# Patient Record
Sex: Male | Born: 1960 | Hispanic: No | Marital: Single | Smoking: Never smoker
Health system: Southern US, Community
[De-identification: ages and names within clinical notes are randomized; demographics above are authoritative.]

## PROBLEM LIST (undated history)

## (undated) DIAGNOSIS — I10 Essential (primary) hypertension: Secondary | ICD-10-CM

## (undated) DIAGNOSIS — C9000 Multiple myeloma not having achieved remission: Secondary | ICD-10-CM

## (undated) DIAGNOSIS — J9620 Acute and chronic respiratory failure, unspecified whether with hypoxia or hypercapnia: Secondary | ICD-10-CM

## (undated) DIAGNOSIS — J449 Chronic obstructive pulmonary disease, unspecified: Secondary | ICD-10-CM

## (undated) DIAGNOSIS — I272 Pulmonary hypertension, unspecified: Secondary | ICD-10-CM

## (undated) DIAGNOSIS — N200 Calculus of kidney: Secondary | ICD-10-CM

## (undated) HISTORY — PX: KIDNEY SURGERY: SHX687

---

## 2001-11-04 HISTORY — PX: VIDEO ASSISTED THORACOSCOPY (VATS)/DECORTICATION: SHX6171

## 2001-12-27 ENCOUNTER — Inpatient Hospital Stay (HOSPITAL_COMMUNITY): Admission: EM | Admit: 2001-12-27 | Discharge: 2002-01-07 | Payer: Self-pay

## 2001-12-28 ENCOUNTER — Encounter: Payer: Self-pay | Admitting: Family Medicine

## 2001-12-30 ENCOUNTER — Encounter: Payer: Self-pay | Admitting: Cardiothoracic Surgery

## 2001-12-31 ENCOUNTER — Encounter: Payer: Self-pay | Admitting: Cardiothoracic Surgery

## 2002-01-01 ENCOUNTER — Encounter: Payer: Self-pay | Admitting: Cardiothoracic Surgery

## 2002-01-02 ENCOUNTER — Encounter: Payer: Self-pay | Admitting: Cardiothoracic Surgery

## 2002-01-03 ENCOUNTER — Encounter: Payer: Self-pay | Admitting: Cardiothoracic Surgery

## 2002-01-04 ENCOUNTER — Encounter: Payer: Self-pay | Admitting: Cardiothoracic Surgery

## 2002-01-05 ENCOUNTER — Encounter: Payer: Self-pay | Admitting: Cardiothoracic Surgery

## 2002-01-06 ENCOUNTER — Encounter: Payer: Self-pay | Admitting: Cardiothoracic Surgery

## 2002-01-07 ENCOUNTER — Encounter: Payer: Self-pay | Admitting: Cardiothoracic Surgery

## 2002-01-07 ENCOUNTER — Encounter: Admission: RE | Admit: 2002-01-07 | Discharge: 2002-01-07 | Payer: Self-pay | Admitting: Family Medicine

## 2002-01-14 ENCOUNTER — Encounter: Admission: RE | Admit: 2002-01-14 | Discharge: 2002-01-14 | Payer: Self-pay | Admitting: Cardiothoracic Surgery

## 2002-01-14 ENCOUNTER — Encounter: Payer: Self-pay | Admitting: Cardiothoracic Surgery

## 2002-01-28 ENCOUNTER — Encounter: Payer: Self-pay | Admitting: Cardiothoracic Surgery

## 2002-01-28 ENCOUNTER — Encounter: Admission: RE | Admit: 2002-01-28 | Discharge: 2002-01-28 | Payer: Self-pay | Admitting: Cardiothoracic Surgery

## 2002-02-18 ENCOUNTER — Encounter: Payer: Self-pay | Admitting: Cardiothoracic Surgery

## 2002-02-18 ENCOUNTER — Encounter: Admission: RE | Admit: 2002-02-18 | Discharge: 2002-02-18 | Payer: Self-pay | Admitting: Cardiothoracic Surgery

## 2005-10-09 ENCOUNTER — Emergency Department (HOSPITAL_COMMUNITY): Admission: EM | Admit: 2005-10-09 | Discharge: 2005-10-09 | Payer: Self-pay | Admitting: Family Medicine

## 2005-10-12 ENCOUNTER — Emergency Department (HOSPITAL_COMMUNITY): Admission: EM | Admit: 2005-10-12 | Discharge: 2005-10-12 | Payer: Self-pay | Admitting: Family Medicine

## 2011-04-16 ENCOUNTER — Inpatient Hospital Stay (INDEPENDENT_AMBULATORY_CARE_PROVIDER_SITE_OTHER)
Admission: RE | Admit: 2011-04-16 | Discharge: 2011-04-16 | Disposition: A | Discharge status: Home / Self Care | Payer: BC Managed Care – PPO | Source: Ambulatory Visit | Attending: Family Medicine | Admitting: Family Medicine

## 2011-04-16 DIAGNOSIS — K219 Gastro-esophageal reflux disease without esophagitis: Secondary | ICD-10-CM

## 2011-04-17 ENCOUNTER — Inpatient Hospital Stay (HOSPITAL_COMMUNITY)
Admission: EM | Admit: 2011-04-17 | Discharge: 2011-04-22 | DRG: 089 | Disposition: A | Discharge status: Home / Self Care | Payer: BC Managed Care – PPO | Attending: Internal Medicine | Admitting: Internal Medicine

## 2011-04-17 ENCOUNTER — Emergency Department (HOSPITAL_COMMUNITY): Payer: BC Managed Care – PPO

## 2011-04-17 DIAGNOSIS — J189 Pneumonia, unspecified organism: Principal | ICD-10-CM | POA: Diagnosis present

## 2011-04-17 DIAGNOSIS — K219 Gastro-esophageal reflux disease without esophagitis: Secondary | ICD-10-CM | POA: Diagnosis present

## 2011-04-17 DIAGNOSIS — E876 Hypokalemia: Secondary | ICD-10-CM | POA: Diagnosis present

## 2011-04-17 DIAGNOSIS — J90 Pleural effusion, not elsewhere classified: Secondary | ICD-10-CM | POA: Diagnosis present

## 2011-04-17 LAB — DIFFERENTIAL
Basophils Absolute: 0 10*3/uL (ref 0.0–0.1)
Basophils Relative: 0 % (ref 0–1)
Eosinophils Absolute: 0 10*3/uL (ref 0.0–0.7)
Monocytes Relative: 5 % (ref 3–12)
Neutro Abs: 11.9 10*3/uL — ABNORMAL HIGH (ref 1.7–7.7)
Neutrophils Relative %: 86 % — ABNORMAL HIGH (ref 43–77)

## 2011-04-17 LAB — POCT I-STAT, CHEM 8
BUN: 12 mg/dL (ref 6–23)
Calcium, Ion: 1.08 mmol/L — ABNORMAL LOW (ref 1.12–1.32)
Chloride: 98 mEq/L (ref 96–112)
Creatinine, Ser: 1.1 mg/dL (ref 0.4–1.5)
Glucose, Bld: 104 mg/dL — ABNORMAL HIGH (ref 70–99)
HCT: 51 % (ref 39.0–52.0)
Hemoglobin: 17.3 g/dL — ABNORMAL HIGH (ref 13.0–17.0)
Potassium: 3.7 mEq/L (ref 3.5–5.1)
Sodium: 136 mEq/L (ref 135–145)
TCO2: 29 mmol/L (ref 0–100)

## 2011-04-17 LAB — CBC
Hemoglobin: 15.9 g/dL (ref 13.0–17.0)
MCH: 28.3 pg (ref 26.0–34.0)
Platelets: 297 10*3/uL (ref 150–400)
RBC: 5.61 MIL/uL (ref 4.22–5.81)
WBC: 13.9 10*3/uL — ABNORMAL HIGH (ref 4.0–10.5)

## 2011-04-17 LAB — HEPATIC FUNCTION PANEL
ALT: 14 U/L (ref 0–53)
AST: 18 U/L (ref 0–37)
Albumin: 2.9 g/dL — ABNORMAL LOW (ref 3.5–5.2)
Alkaline Phosphatase: 104 U/L (ref 39–117)
Total Protein: 9 g/dL — ABNORMAL HIGH (ref 6.0–8.3)

## 2011-04-17 MED ORDER — IOHEXOL 300 MG/ML  SOLN
80.0000 mL | Freq: Once | INTRAMUSCULAR | Status: AC | PRN
  Administered 2011-04-17: 80 mL via INTRAVENOUS

## 2011-04-18 LAB — EXPECTORATED SPUTUM ASSESSMENT W GRAM STAIN, RFLX TO RESP C

## 2011-04-19 LAB — CBC
HCT: 38.6 % — ABNORMAL LOW (ref 39.0–52.0)
Hemoglobin: 13.1 g/dL (ref 13.0–17.0)
MCHC: 33.9 g/dL (ref 30.0–36.0)
RBC: 4.81 MIL/uL (ref 4.22–5.81)
WBC: 7 10*3/uL (ref 4.0–10.5)

## 2011-04-19 LAB — BASIC METABOLIC PANEL
CO2: 29 mEq/L (ref 19–32)
Calcium: 8.1 mg/dL — ABNORMAL LOW (ref 8.4–10.5)
Creatinine, Ser: 0.75 mg/dL (ref 0.50–1.35)
GFR calc non Af Amer: >60 mL/min (ref >60)
Sodium: 140 mEq/L (ref 135–145)

## 2011-04-20 LAB — EXPECTORATED SPUTUM ASSESSMENT W GRAM STAIN, RFLX TO RESP C

## 2011-04-21 LAB — BASIC METABOLIC PANEL
BUN: 7 mg/dL (ref 6–23)
CO2: 31 mEq/L (ref 19–32)
Chloride: 103 mEq/L (ref 96–112)
Creatinine, Ser: 0.78 mg/dL (ref 0.50–1.35)
GFR calc Af Amer: >60 mL/min (ref >60)
Glucose, Bld: 106 mg/dL — ABNORMAL HIGH (ref 70–99)

## 2011-04-21 LAB — CULTURE, RESPIRATORY W GRAM STAIN

## 2011-04-21 LAB — EXPECTORATED SPUTUM ASSESSMENT W GRAM STAIN, RFLX TO RESP C

## 2011-04-22 LAB — BASIC METABOLIC PANEL
CO2: 26 mEq/L (ref 19–32)
Glucose, Bld: 91 mg/dL (ref 70–99)
Potassium: 4.8 mEq/L (ref 3.5–5.1)
Sodium: 138 mEq/L (ref 135–145)

## 2011-04-22 LAB — CULTURE, RESPIRATORY W GRAM STAIN: Culture: NORMAL

## 2011-04-24 LAB — CULTURE, BLOOD (ROUTINE X 2)

## 2011-05-01 NOTE — Discharge Summary (Signed)
[name] [name], [name] [name]                    ACCOUNT NO.:  [id number]  MEDICAL RECORD NO.:  [id number]  LOCATION:  5509                         FACILITY:  [facility]  PHYSICIAN:  Kela Millin, M.D.DATE OF BIRTH:  01-31-1961  DATE OF ADMISSION:  04/17/2011 DATE OF DISCHARGE:  04/22/2011                        DISCHARGE SUMMARY - REFERRING   DISCHARGE DIAGNOSES: 1. Right lower lobe pneumonia, community acquired. 2. Chronic left pleural effusion, loculated - stable. 3. History of left loculated pleural effusion in 2003 and status post     bronchoscopy with left thoracotomy and decortication per Dr.     Tyrone Sage. 4. Hypokalemia - resolved.  PROCEDURES AND STUDIES: 1. CT scan of chest on April 17, 2011 - stable chronic pleural     collection in left, loculated compared to prior chest x-ray.  Right     lower lobe infiltrate, fever to be acute.  Pleural calcifications. 2. AFB smears x3 negative for acid-fast bacilli.  PPD negative.  BRIEF HISTORY:  The patient is a 50 year old male with the above-listed medical problems, who presented with a 3-day history of a cough productive of yellowish sputum and feeling feverish.  It was noted that he immigrated to the [place] about 26 years ago and his history is notable for left loculated pleural effusion in 2003 and status post bronchoscopy with left thoracotomy and decortication.  He had a chest x- ray done at the Urgent Care, which revealed right lower lobe infiltrate and he was sent to the [facility] ED for further evaluation and management. In the ED, labs revealed a white cell count of 13.6 with a hemoglobin of 15.6 and a CT scan of his chest was done and the results are as stated above.  Because of the concern of a TB, he was admitted to the Hospitalist Service for further evaluation and management.  HOSPITAL COURSE: 1. Community-acquired pneumonia, right lower lobe - upon admission the     patient was started on empiric antibiotics with  Rocephin and     Zithromax.  He was placed in respiratory isolation and a PPD skin     test was done and was read as negative.  Sputum for AFB was done     and the smears have come back x3 negative.  The patient's pleuritic     pain and cough have improved on the antibiotics.  He has remained     hemodynamically stable and afebrile and his last white cell count     prior to discharge was 7.0.  He will be discharged on oral     antibiotics to complete the treatment regimen.  He has been given     the number to [facility] to establish a PCP for outpatient     followup. 2. History of chronic left effusion - as discussed above, imaging     studies as above.  The patient is to follow up with Dr. Tyrone Sage     upon discharge.. 3. Hypokalemia - his potassium was replaced during this hospital stay.     His potassium today prior to discharge is 4.8.  DISCHARGE MEDICATIONS: 1. Ceftin 500 mg one p.o. b.i.d. for four more  days. 2. Mucinex DM 2 tablets b.i.d. 3. Tussionex 5 mL p.o. q.12 h for cough. 4. Ibuprofen 600 mg t.i.d. p.r.n. with food. 5. Ranitidine 150 mg p.o. t.i.d. 6. Sucralfate 1 gram p.o. b.i.d.  FOLLOWUP CARE: 1. Primary care physician in 1-2 weeks. 2. Dr. Tyrone Sage in 1-2 weeks.  Please call for appointment.  DISCHARGE CONDITION:  Improved/stable.     Kela Millin, M.D.     ACV/MEDQ  D:  04/22/2011  T:  04/22/2011  Job:  [id number]  Electronically Signed by Donnalee Curry M.D. on 05/01/2011 07:33:40 AM

## 2011-05-03 NOTE — H&P (Signed)
NAMEHANNAN, HUTMACHER NO.:  [id number]  MEDICAL RECORD NO.:  [id number]  LOCATION:  [facility]                         FACILITY:  [facility]  PHYSICIAN:  Houston Siren, MD           DATE OF BIRTH:  1960-11-06  DATE OF ADMISSION:  04/17/2011 DATE OF DISCHARGE:                             HISTORY & PHYSICAL   PRIMARY CARE PHYSICIAN:  None.  ADVANCE DIRECTIVE:  Full code.  REASON FOR ADMISSION:  Community-acquired pneumonia.  HISTORY OF PRESENT ILLNESS:  This is a 50 year old [place] male with benign past medical history, came to [place] 26 years ago, with history of left loculated pleural effusion in 2003, status post bronchoscopy and left thoracotomy with decortication, presents with 3- day history of yellow productive sputum and feeling feverish.  Chest x- ray from the urgent care showed right lower lobe infiltrate, and he was sent here.  Further workup showed leukocytosis with a white count of 13,600, hemoglobin of 15.6.  Normal liver function tests and normal creatinine of 1.1.  There was concern about tuberculosis and hospitalist was asked to admit the patient.  PAST MEDICAL HISTORY: 1. Recurrent pneumonia, chronic left loculated pleural effusion status     post thoracotomy and decortication. 2. GERD.  SOCIAL HISTORY:  He works as a Administrator.  Denied tobacco, alcohol use, but drink occasionally.  ALLERGIES:  No known drug allergies.  CHRONIC MEDICATIONS:  Carafate and Zantac.  REVIEW OF SYSTEMS:  Significant for chronic cough just past 3 days.  No weight loss.  No night sweats.  FAMILY HISTORY:  Noncontributory.  PHYSICAL EXAMINATION:  VITAL SIGNS:  Temperature 99.3, blood pressure 138/81, pulse of 96, respiratory rate of 18. GENERAL:  He is alert and oriented, he is in no apparent distress, is conversing rationally. HEENT:  Sclerae are nonicteric.  Throat is clear. NECK:  Supple.  No lymphadenopathy.  No stridor. CARDIAC:  Reveals an S1 and  S2, regular.  I did not hear any murmur, rub, or gallop. LUNGS:  Show scattered rhonchi and crackle in his right lung base. ABDOMEN:  Soft, nondistended, nontender. EXTREMITIES:  No edema.  No calf tenderness.  Good distal pulses bilaterally. SKIN:  Warm and dry. NEUROLOGIC:  Nonfocal.  OBJECTIVE FINDINGS:  Chest x-ray showed right lower lobe infiltrate.  CT of his chest shows stable chronic pleural collection in the left that is loculated compared to prior x-ray, right lower lobe infiltrate, and pleural calcification.  Liver function tests are normal.  White count of 13,900, hemoglobin of 15.9, platelet count 297,000.  Potassium of 3.7, creatinine 1.1, glucose of 109.  IMPRESSION:  This is a 50 year old [place] male who immigrated to [place] 26 years ago, with history of left loculated pleural effusion, left empyema status post prior thoracotomy and decortication in 2003, presents now with likely community-acquired pneumonia.  He actually tested negative for PPD in 2003 after immigrated here.  I do not think that this is tuberculosis, and it would be a very low risk.  I spoke with Dr. Norva Pavlov, radiologist, and she agrees.  His changes radiographically has been chronic.  We will treat  with Rocephin and Zithromax.  We will follow him clinically.  We will put another PPD and control.  He is a full code and will be admitted to [facility]     Houston Siren, MD     PL/MEDQ  D:  04/17/2011  T:  04/17/2011  Job:  [id number]  Electronically Signed by Houston Siren  on 05/03/2011 09:05:45 PM

## 2011-06-02 LAB — AFB CULTURE WITH SMEAR (NOT AT ARMC): Acid Fast Smear: NONE SEEN

## 2011-06-03 LAB — AFB CULTURE WITH SMEAR (NOT AT ARMC): Acid Fast Smear: NONE SEEN

## 2012-11-10 ENCOUNTER — Other Ambulatory Visit: Payer: Self-pay

## 2012-11-10 ENCOUNTER — Encounter (HOSPITAL_COMMUNITY): Payer: Self-pay | Admitting: Emergency Medicine

## 2012-11-10 ENCOUNTER — Emergency Department (HOSPITAL_COMMUNITY)
Admission: EM | Admit: 2012-11-10 | Discharge: 2012-11-10 | Disposition: A | Discharge status: Nursing Home (Includes ICF) | Payer: BC Managed Care – PPO | Source: Home / Self Care | Attending: Emergency Medicine | Admitting: Emergency Medicine

## 2012-11-10 ENCOUNTER — Emergency Department (HOSPITAL_COMMUNITY)
Admission: EM | Admit: 2012-11-10 | Discharge: 2012-11-11 | Disposition: A | Discharge status: Home Health | Payer: BC Managed Care – PPO | Attending: Emergency Medicine | Admitting: Emergency Medicine

## 2012-11-10 ENCOUNTER — Emergency Department (HOSPITAL_COMMUNITY): Payer: BC Managed Care – PPO

## 2012-11-10 ENCOUNTER — Emergency Department (INDEPENDENT_AMBULATORY_CARE_PROVIDER_SITE_OTHER): Payer: BC Managed Care – PPO

## 2012-11-10 DIAGNOSIS — R079 Chest pain, unspecified: Secondary | ICD-10-CM | POA: Insufficient documentation

## 2012-11-10 DIAGNOSIS — R630 Anorexia: Secondary | ICD-10-CM | POA: Insufficient documentation

## 2012-11-10 DIAGNOSIS — J90 Pleural effusion, not elsewhere classified: Secondary | ICD-10-CM

## 2012-11-10 DIAGNOSIS — R05 Cough: Secondary | ICD-10-CM

## 2012-11-10 DIAGNOSIS — J189 Pneumonia, unspecified organism: Secondary | ICD-10-CM | POA: Insufficient documentation

## 2012-11-10 DIAGNOSIS — R0602 Shortness of breath: Secondary | ICD-10-CM | POA: Insufficient documentation

## 2012-11-10 DIAGNOSIS — R918 Other nonspecific abnormal finding of lung field: Secondary | ICD-10-CM

## 2012-11-10 DIAGNOSIS — R197 Diarrhea, unspecified: Secondary | ICD-10-CM | POA: Insufficient documentation

## 2012-11-10 DIAGNOSIS — R109 Unspecified abdominal pain: Secondary | ICD-10-CM | POA: Insufficient documentation

## 2012-11-10 DIAGNOSIS — J029 Acute pharyngitis, unspecified: Secondary | ICD-10-CM | POA: Insufficient documentation

## 2012-11-10 LAB — COMPREHENSIVE METABOLIC PANEL
Albumin: 3.3 g/dL — ABNORMAL LOW (ref 3.5–5.2)
Alkaline Phosphatase: 97 U/L (ref 39–117)
BUN: 10 mg/dL (ref 6–23)
Creatinine, Ser: 1.06 mg/dL (ref 0.50–1.35)
Potassium: 3.6 mEq/L (ref 3.5–5.1)
Total Protein: 8.5 g/dL — ABNORMAL HIGH (ref 6.0–8.3)

## 2012-11-10 LAB — CBC WITH DIFFERENTIAL/PLATELET
Basophils Relative: 1 % (ref 0–1)
Eosinophils Absolute: 0 10*3/uL (ref 0.0–0.7)
Hemoglobin: 15.3 g/dL (ref 13.0–17.0)
MCH: 27.9 pg (ref 26.0–34.0)
MCHC: 34.6 g/dL (ref 30.0–36.0)
Monocytes Relative: 14 % — ABNORMAL HIGH (ref 3–12)
Neutrophils Relative %: 67 % (ref 43–77)
RDW: 13.5 % (ref 11.5–15.5)

## 2012-11-10 LAB — POCT I-STAT TROPONIN I: Troponin i, poc: 0.01 ng/mL (ref 0.00–0.08)

## 2012-11-10 LAB — LIPASE, BLOOD: Lipase: 17 U/L (ref 11–59)

## 2012-11-10 MED ORDER — IPRATROPIUM BROMIDE 0.02 % IN SOLN: 0.5000 mg | Freq: Once | RESPIRATORY_TRACT | Status: DC

## 2012-11-10 MED ORDER — ALBUTEROL SULFATE (5 MG/ML) 0.5% IN NEBU: 5.0000 mg | INHALATION_SOLUTION | Freq: Once | RESPIRATORY_TRACT | Status: DC

## 2012-11-10 NOTE — ED Notes (Signed)
Pt c/o SOB and nauseas Denies: diarrhea Sx include: fevers, vomiting, abd pain due cough, productive cough.

## 2012-11-10 NOTE — ED Provider Notes (Signed)
Chief Complaint  Patient presents with  . Shortness of Breath    History of Present Illness:   The patient is a 52 year old [place] male who presents with a 2 to three-day history of shortness of breath, wheezing, nausea, vomiting, abdominal pain, fever, cough productive yellow phlegm, and sore throat. He has not had any chest pain, diarrhea, nasal congestion, or history of asthma or COPD. He does have a history of a chronic, loculated pleural effusion on the left. No history of tuberculosis or lung cancer. His pleural effusion dates back to 2003. He underwent decortication and bronchoscopy. This was done by Dr. Tyrone Sage. He was rehospitalized in 2012 with an acute pneumonia. He had screening for tuberculosis when he emigrated to the [place] from [place] 26 years ago. This was negative.  Review of Systems:  Other than noted above, the patient denies any of the following symptoms: Systemic:  No fevers, chills, sweats, weight loss or gain, fatigue, or tiredness. ENT:  No nasal congestion, sneezing, itching, postnasal drip, sinus pressure, headache, sore throat, or hoarseness. Lungs:  No wheezing, shortness of breath, chest tightness or congestion. Heart:  No chest pain, tightness, pressure, PND, orthopnea, or ankle edema. GI:  No indigestion, heartburn, waterbrash, burping, abdominal pain, nausea, or vomiting.  PMFSH:  Past medical history, family history, social history, meds, and allergies were reviewed.  Specifically, there is no history of asthma, allergies, reflux esophagitis or cigarette smoking.   Physical Exam:   Vital signs:  BP 140/87  Pulse 94  Temp 99.9 F (37.7 C) (Oral)  Resp 16  SpO2 96% General:  Alert and oriented.  In no distress.  Skin warm and dry. ENT: TMs and ear canals normal.  Nasal mucosa normal, without drainage.  Pharynx clear without exudate or drainage.  No intraoral lesions. Neck:  No adenopathy, tenderness or mass.  No JVD. Lungs:  No respiratory  distress.  He has bilateral rales, wheezes, and rhonchi both anteriorly and posteriorly. Heart:  Regular rhythm, no gallops or murmers.  No pedal edema. Abdomon:  Soft and nontender.  No organomegaly or mass.  Radiology:  Dg Chest 2 View  11/10/2012  *RADIOLOGY REPORT*  Clinical Data: Productive cough and fever for 3 days.  CHEST - 2 VIEW  Comparison: CT chest 04/17/2011.  Findings: There is a stable loculated left pleural collection along the left hemithorax extending cephalad over a 17 cm length  The appearance is stable compared with prior CT, when technique differences are considered.  Cardiomegaly is noted.  Moderate pleural calcifications bilaterally. Faint opacity at the right base suspicious for early right lower lobe pneumonia. Mild blunting CP angle on the right could be acute or chronic.  No acute osseous findings.  IMPRESSION: Suspect early right lower lobe pneumonia.  Chronic changes as described.   Original Report Authenticated By: Davonna Belling, M.D.     Assessment:  The primary encounter diagnosis was Cough. Diagnoses of Lung mass and Pleural effusion were also pertinent to this visit.  He appears to have chronic changes on his chest x-ray including pleural effusion and scarring with a superimposed acute process such as pneumonia. Other diagnostic possibilities include tuberculosis, although he has been screened for this before, and transformation to lung cancer. Therefore I believe he needs further evaluation and possibly admission.  Plan:   1.  The following meds were prescribed:   New Prescriptions   No medications on file   2.  The patient was transferred to the emergency department via [facility]  with respiratory and droplet precautions taken.    Reuben Likes, MD 11/10/12 2026

## 2012-11-10 NOTE — ED Notes (Signed)
Patient given mask to wear in triage.

## 2012-11-10 NOTE — ED Notes (Addendum)
Patient complaining of shortness of breath, feeling of "something stuck" in his throat, productive cough, and fever for the last three days.  Patient reports chest "discomfort" and congestion; denies "pain".  Patient also reporting nausea, vomiting, diarrhea, and abdominal pain.  Patient reports two episodes of emesis in the last 24 hours.

## 2012-11-11 LAB — URINALYSIS, ROUTINE W REFLEX MICROSCOPIC
Leukocytes, UA: NEGATIVE
Nitrite: NEGATIVE
Specific Gravity, Urine: >1.03 — ABNORMAL HIGH (ref 1.005–1.030)
pH: 5.5 (ref 5.0–8.0)

## 2012-11-11 LAB — URINE MICROSCOPIC-ADD ON

## 2012-11-11 MED ORDER — AZITHROMYCIN 250 MG PO TABS: 250.0000 mg | ORAL_TABLET | Freq: Every day | ORAL | Status: DC

## 2012-11-11 MED ORDER — DEXTROSE 5 % IV SOLN
INTRAVENOUS | Status: AC
  Administered 2012-11-11: 02:00:00
  Filled 2012-11-11: qty 10

## 2012-11-11 MED ORDER — BENZONATATE 100 MG PO CAPS: 100.0000 mg | ORAL_CAPSULE | Freq: Three times a day (TID) | ORAL | Status: DC

## 2012-11-11 MED ORDER — CEFTRIAXONE SODIUM 1 G IJ SOLR
INTRAMUSCULAR | Status: AC
  Filled 2012-11-11: qty 10

## 2012-11-11 MED ORDER — AZITHROMYCIN 250 MG PO TABS
ORAL_TABLET | ORAL | Status: AC
  Administered 2012-11-11: 02:00:00
  Filled 2012-11-11: qty 2

## 2012-11-11 NOTE — ED Notes (Signed)
IV removed with cath intact.

## 2012-11-11 NOTE — ED Notes (Signed)
See paper chart 

## 2012-11-11 NOTE — ED Notes (Signed)
Pt calling ride home.

## 2012-11-11 NOTE — ED Provider Notes (Addendum)
History     CSN: [id number]  Arrival date & time 11/10/12  2020   First MD Initiated Contact with Patient 11/11/12 0155      Chief Complaint  Patient presents with  . Cough  . Shortness of Breath    (Consider location/radiation/quality/duration/timing/severity/associated sxs/prior treatment) HPI Comments: 52 year old male who presents with complaint of 2-3 days of gradual onset, gradually worsening chest pain and shortness of breath. He was sent at the urgent care prior to arrival and sent to the emergency department for further evaluation. According to the nephew who helps translate for the patient his first symptom was a cough which was followed by a sore throat which was followed by chest pain and shortness of breath with coughing. He also admits to mild abdominal pain that has had watery diarrhea for 2 days. He has had decreased appetite and has had nothing to eat or drink for 2 days because of the decreased appetite. The patient otherwise has no medical problems, no significant surgical history and no significant infectious history.  Patient is a 52 y.o. male presenting with cough and shortness of breath. The history is provided by the patient.  Cough Associated symptoms include shortness of breath.  Shortness of Breath  Associated symptoms include cough and shortness of breath.    History reviewed. No pertinent past medical history.  Past Surgical History  Procedure Date  . Kidney surgery     History reviewed. No pertinent family history.  History  Substance Use Topics  . Smoking status: Never Smoker   . Smokeless tobacco: Not on file  . Alcohol Use: Yes      Review of Systems  Respiratory: Positive for cough and shortness of breath.   All other systems reviewed and are negative.    Allergies  Review of patient's allergies indicates no known allergies.  Home Medications   Current Outpatient Rx  Name  Route  Sig  Dispense  Refill  . AZITHROMYCIN 250 MG PO  TABS   Oral   Take 1 tablet (250 mg total) by mouth daily. 500mg  PO day 1, then 250mg  PO days 205   6 tablet   0   . BENZONATATE 100 MG PO CAPS   Oral   Take 1 capsule (100 mg total) by mouth every 8 (eight) hours.   21 capsule   0     BP 142/89  Pulse 82  Temp 99 F (37.2 C) (Oral)  Resp 19  SpO2 93%  Physical Exam  Nursing note and vitals reviewed. Constitutional: He appears well-developed and well-nourished. No distress.  HENT:  Head: Normocephalic and atraumatic.  Mouth/Throat: Oropharynx is clear and moist. No oropharyngeal exudate.  Eyes: Conjunctivae normal and EOM are normal. Pupils are equal, round, and reactive to light. Right eye exhibits no discharge. Left eye exhibits no discharge. No scleral icterus.  Neck: Normal range of motion. Neck supple. No JVD present. No thyromegaly present.  Cardiovascular: Normal rate, regular rhythm, normal heart sounds and intact distal pulses.  Exam reveals no gallop and no friction rub.   No murmur heard. Pulmonary/Chest: Effort normal and breath sounds normal. No respiratory distress.       Pulmonary exam significant for decreased breath sounds at the left base and occasional rales at the right base. There is no increased work of breathing, no accessory muscle use, no wheezing  Abdominal: Soft. Bowel sounds are normal. He exhibits no distension and no mass. There is no tenderness.  Musculoskeletal: Normal range of  motion. He exhibits no edema and no tenderness.  Lymphadenopathy:    He has no cervical adenopathy.  Neurological: He is alert. Coordination normal.  Skin: Skin is warm and dry. No rash noted. No erythema.  Psychiatric: He has a normal mood and affect. His behavior is normal.    ED Course  Procedures (including critical care time)  Labs Reviewed  CBC WITH DIFFERENTIAL - Abnormal; Notable for the following:    Monocytes Relative 14 (*)     All other components within normal limits  COMPREHENSIVE METABOLIC PANEL -  Abnormal; Notable for the following:    Sodium 133 (*)     Chloride 94 (*)     Glucose, Bld 113 (*)     Total Protein 8.5 (*)     Albumin 3.3 (*)     AST 46 (*)     GFR calc non Af Amer 80 (*)     All other components within normal limits  URINALYSIS, ROUTINE W REFLEX MICROSCOPIC - Abnormal; Notable for the following:    Specific Gravity, Urine >1.030 (*)     Hgb urine dipstick TRACE (*)     All other components within normal limits  LIPASE, BLOOD  POCT I-STAT TROPONIN I  URINE MICROSCOPIC-ADD ON   Dg Chest 2 View  11/10/2012  *RADIOLOGY REPORT*  Clinical Data: Cough and shortness of breath.  CHEST - 2 VIEW  Comparison: PA and lateral chest 11/10/2012 and 10/09/2005. CT chest 04/17/2011.  Findings: Chronic left pleural effusion and basilar airspace disease are unchanged.  Airspace opacity the medial aspect of the right lung base seen on the most recent study appears improved. Chronic scarring in the right base is again identified.  Heart size is normal.  No pneumothorax.  IMPRESSION: Improved right lower lobe aeration with chronic change as described above.   Original Report Authenticated By: Holley Dexter, M.D.    Dg Chest 2 View  11/10/2012  *RADIOLOGY REPORT*  Clinical Data: Productive cough and fever for 3 days.  CHEST - 2 VIEW  Comparison: CT chest 04/17/2011.  Findings: There is a stable loculated left pleural collection along the left hemithorax extending cephalad over a 17 cm length  The appearance is stable compared with prior CT, when technique differences are considered.  Cardiomegaly is noted.  Moderate pleural calcifications bilaterally. Faint opacity at the right base suspicious for early right lower lobe pneumonia. Mild blunting CP angle on the right could be acute or chronic.  No acute osseous findings.  IMPRESSION: Suspect early right lower lobe pneumonia.  Chronic changes as described.   Original Report Authenticated By: Davonna Belling, M.D.      1. CAP (community acquired  pneumonia)   2. Pleural effusion       MDM  The patient has no abdominal pain on exam, abnormal lungs however his oxygenation is 95% on room air without tachycardia fever or tachypnea.  His prior imaging is consistent with today's imaging including a left pleural effusion and scarring at the right base. His symptoms seem to be acute to the last 2-3 days, he does not have a leukocytosis and due to his sore throat, cough and lab and x-ray findings I think it reasonable to treat the patient for a possible community-acquired pneumonia as well as rehydrate with IV fluids. His specific gravity is low consistent with a dehydrated state. That being said the patient appears nontoxic, afebrile and can followup as an outpatient if this acute process does not clear and appropriate  time frame. I discussed this with the patient and his family member and they are in agreement.  ED ECG REPORT  I personally interpreted this EKG   Date: 11/11/2012   Rate: 91  Rhythm: normal sinus rhythm  QRS Axis: normal  Intervals: normal  ST/T Wave abnormalities: nonspecific T wave changes  Conduction Disutrbances:none  Narrative Interpretation:   Old EKG Reviewed: Compared with June 13 12, rate is slower, EKG otherwise unchanged       Vida Roller, MD 11/11/12 4540  Vida Roller, MD 11/11/12 [phone]

## 2012-11-11 NOTE — ED Notes (Signed)
Pt amb to BR without problem. States he is feeling better after IV fluids

## 2015-08-06 DIAGNOSIS — B1920 Unspecified viral hepatitis C without hepatic coma: Secondary | ICD-10-CM | POA: Insufficient documentation

## 2015-09-25 ENCOUNTER — Emergency Department (INDEPENDENT_AMBULATORY_CARE_PROVIDER_SITE_OTHER): Payer: BLUE CROSS/BLUE SHIELD

## 2015-09-25 ENCOUNTER — Encounter (HOSPITAL_COMMUNITY): Payer: Self-pay | Admitting: Emergency Medicine

## 2015-09-25 ENCOUNTER — Emergency Department (INDEPENDENT_AMBULATORY_CARE_PROVIDER_SITE_OTHER)
Admission: EM | Admit: 2015-09-25 | Discharge: 2015-09-25 | Payer: BLUE CROSS/BLUE SHIELD | Source: Home / Self Care | Attending: Family Medicine | Admitting: Family Medicine

## 2015-09-25 ENCOUNTER — Observation Stay (HOSPITAL_COMMUNITY)
Admission: EM | Admit: 2015-09-25 | Discharge: 2015-09-30 | Disposition: A | Discharge status: Home / Self Care | Payer: BLUE CROSS/BLUE SHIELD | Attending: Internal Medicine | Admitting: Internal Medicine

## 2015-09-25 DIAGNOSIS — R0600 Dyspnea, unspecified: Secondary | ICD-10-CM

## 2015-09-25 DIAGNOSIS — J90 Pleural effusion, not elsewhere classified: Secondary | ICD-10-CM | POA: Diagnosis not present

## 2015-09-25 DIAGNOSIS — R109 Unspecified abdominal pain: Secondary | ICD-10-CM | POA: Diagnosis not present

## 2015-09-25 DIAGNOSIS — J948 Other specified pleural conditions: Secondary | ICD-10-CM | POA: Diagnosis present

## 2015-09-25 DIAGNOSIS — R634 Abnormal weight loss: Secondary | ICD-10-CM | POA: Diagnosis not present

## 2015-09-25 DIAGNOSIS — E869 Volume depletion, unspecified: Secondary | ICD-10-CM | POA: Diagnosis not present

## 2015-09-25 DIAGNOSIS — R0602 Shortness of breath: Secondary | ICD-10-CM | POA: Diagnosis not present

## 2015-09-25 DIAGNOSIS — E871 Hypo-osmolality and hyponatremia: Secondary | ICD-10-CM | POA: Insufficient documentation

## 2015-09-25 DIAGNOSIS — J810 Acute pulmonary edema: Secondary | ICD-10-CM | POA: Diagnosis not present

## 2015-09-25 DIAGNOSIS — I158 Other secondary hypertension: Secondary | ICD-10-CM

## 2015-09-25 DIAGNOSIS — R079 Chest pain, unspecified: Secondary | ICD-10-CM | POA: Insufficient documentation

## 2015-09-25 DIAGNOSIS — J811 Chronic pulmonary edema: Secondary | ICD-10-CM | POA: Diagnosis not present

## 2015-09-25 DIAGNOSIS — J929 Pleural plaque without asbestos: Secondary | ICD-10-CM | POA: Diagnosis not present

## 2015-09-25 DIAGNOSIS — R1084 Generalized abdominal pain: Secondary | ICD-10-CM

## 2015-09-25 DIAGNOSIS — Z23 Encounter for immunization: Secondary | ICD-10-CM | POA: Insufficient documentation

## 2015-09-25 DIAGNOSIS — R197 Diarrhea, unspecified: Secondary | ICD-10-CM | POA: Insufficient documentation

## 2015-09-25 LAB — COMPREHENSIVE METABOLIC PANEL
ALBUMIN: 2.7 g/dL — AB (ref 3.5–5.0)
ALT: 22 U/L (ref 17–63)
AST: 30 U/L (ref 15–41)
Alkaline Phosphatase: 80 U/L (ref 38–126)
Anion gap: 5 (ref 5–15)
BUN: 9 mg/dL (ref 6–20)
CHLORIDE: 100 mmol/L — AB (ref 101–111)
CO2: 33 mmol/L — ABNORMAL HIGH (ref 22–32)
Calcium: 8.5 mg/dL — ABNORMAL LOW (ref 8.9–10.3)
Creatinine, Ser: 1.09 mg/dL (ref 0.61–1.24)
GFR calc Af Amer: >60 mL/min (ref >60)
Glucose, Bld: 88 mg/dL (ref 65–99)
POTASSIUM: 4 mmol/L (ref 3.5–5.1)
SODIUM: 138 mmol/L (ref 135–145)
Total Bilirubin: 0.5 mg/dL (ref 0.3–1.2)
Total Protein: 8.9 g/dL — ABNORMAL HIGH (ref 6.5–8.1)

## 2015-09-25 LAB — CBC
HEMATOCRIT: 45.6 % (ref 39.0–52.0)
Hemoglobin: 15.5 g/dL (ref 13.0–17.0)
MCH: 29.6 pg (ref 26.0–34.0)
MCHC: 34 g/dL (ref 30.0–36.0)
MCV: 87 fL (ref 78.0–100.0)
Platelets: 235 10*3/uL (ref 150–400)
RBC: 5.24 MIL/uL (ref 4.22–5.81)
RDW: 13.6 % (ref 11.5–15.5)
WBC: 5 10*3/uL (ref 4.0–10.5)

## 2015-09-25 LAB — I-STAT TROPONIN, ED: Troponin i, poc: 0 ng/mL (ref 0.00–0.08)

## 2015-09-25 LAB — URINALYSIS, ROUTINE W REFLEX MICROSCOPIC
Bilirubin Urine: NEGATIVE
GLUCOSE, UA: NEGATIVE mg/dL
Hgb urine dipstick: NEGATIVE
KETONES UR: NEGATIVE mg/dL
LEUKOCYTES UA: NEGATIVE
Nitrite: NEGATIVE
PH: 5.5 (ref 5.0–8.0)
Protein, ur: NEGATIVE mg/dL
Specific Gravity, Urine: 1.014 (ref 1.005–1.030)

## 2015-09-25 LAB — LIPASE, BLOOD: LIPASE: 37 U/L (ref 11–51)

## 2015-09-25 NOTE — ED Notes (Signed)
Pt here with c/o intermit progressive sob with activity, dyspnea on exertion and rest x 2 weeks Mid sternal chest burning noted as well No swelling or Hx Asthma Family interpretor present BP 175/108- not taking medication, need PCP

## 2015-09-25 NOTE — ED Notes (Signed)
Pt. reports intermittent mid/upper abdominal pain " gas"  for 3 weeks , denies nausea or vomitting / no diarrhea . Denies fever or dysuria . Seen at [facility] urgent care today chest x- ray done .

## 2015-09-25 NOTE — ED Provider Notes (Signed)
CSN: [id number]     Arrival date & time 09/25/15  1706 History   First MD Initiated Contact with Patient 09/25/15 1857     Chief Complaint  Patient presents with  . Shortness of Breath   (Consider location/radiation/quality/duration/timing/severity/associated sxs/prior Treatment) HPI Comments: 54 year old [place] male who speaks broken [place] and is accompanied by his significant other who speaks [place] fluently is complaining of abdominal pain. He states it is in a small area to the lower abdomen but can also "shoot" superiorly. He often has heartburn as well. He states abdominal pain comes and goes. He may last for approximately 30 minutes at a time before spontaneously resolving. It began approximately 2 weeks ago. It is not associated with nausea or vomiting or diarrhea. He does have normal bowel movements. Denies chest pain. He also complains of dyspnea both at rest and on exertion.  Patient is a 54 y.o. male presenting with shortness of breath.  Shortness of Breath Associated symptoms: abdominal pain   Associated symptoms: no chest pain, no cough, no fever, no sore throat and no vomiting     History reviewed. No pertinent past medical history. Past Surgical History  Procedure Laterality Date  . Kidney surgery     No family history on file. Social History  Substance Use Topics  . Smoking status: Never Smoker   . Smokeless tobacco: None  . Alcohol Use: Yes    Review of Systems  Constitutional: Positive for activity change and fatigue. Negative for fever.  HENT: Positive for congestion. Negative for sore throat.   Eyes: Negative.   Respiratory: Positive for shortness of breath. Negative for cough.   Cardiovascular: Negative for chest pain and leg swelling.  Gastrointestinal: Positive for abdominal pain. Negative for nausea, vomiting, constipation and blood in stool.  Genitourinary: Negative.   Skin: Negative.   Neurological: Negative.     Allergies  Review of  patient's allergies indicates no known allergies.  Home Medications   Prior to Admission medications   Medication Sig Start Date End Date Taking? Authorizing Provider  azithromycin (ZITHROMAX Z-PAK) 250 MG tablet Take 1 tablet (250 mg total) by mouth daily. 500mg  PO day 1, then 250mg  PO days 205 11/11/12   Noemi Chapel, MD  benzonatate (TESSALON) 100 MG capsule Take 1 capsule (100 mg total) by mouth every 8 (eight) hours. 11/11/12   Noemi Chapel, MD   Meds Ordered and Administered this Visit  Medications - No data to display  BP 175/108 mmHg  Pulse 77  Temp(Src) 98.4 F (36.9 C) (Oral)  Resp 16  SpO2 95% No data found.   Physical Exam  Constitutional: He appears well-developed and well-nourished. No distress.  Does not appear ill or toxic. He is fully alert. Energetic speech. Laughing and smiling at times.  Eyes: EOM are normal.  Neck: Normal range of motion. Neck supple.  Cardiovascular: Normal rate and regular rhythm.   S1, split S2. No murmur.  Pulmonary/Chest: Effort normal. No respiratory distress.  Bilateral crackles in the lower left and right feels. Greatest in right middle and lower lung fields. Mildly diminished sounds in the right lower lung field.  Abdominal: Soft. Bowel sounds are normal. He exhibits no mass. There is no rebound and no guarding.  Scaphoid. Minor tenderness in the left lower quadrant.  Musculoskeletal: He exhibits no edema or tenderness.  Neurological: He is alert. No cranial nerve deficit. He exhibits normal muscle tone. Coordination normal.  Skin: Skin is warm and dry.  Psychiatric: He has a  normal mood and affect.  Nursing note and vitals reviewed.   ED Course  Procedures (including critical care time)  Labs Review Labs Reviewed - No data to display  Imaging Review Dg Chest 2 View  09/25/2015  CLINICAL DATA:  54 year old male with chest pain EXAM: CHEST  2 VIEW COMPARISON:  Prior chest x-ray 11/10/2012 FINDINGS: Compared to prior imaging  there has been slight interval increase in the volume of the loculated left pleural effusion which now has a convex margin at the superior extent. There is associated atelectasis of the adjacent lung. Stable calcified pleural plaques in the right mid and lower thorax. Cardiac and mediastinal contours are unchanged. The main and central pulmonary arteries are markedly enlarged. No pneumothorax. No new focal airspace consolidation. Osseous structures are intact and unremarkable. IMPRESSION: Compared to 11/10/2012 the chronic loculated left pleural effusion has enlarged and developed more convex margins suggesting the possibility of superinfection or development of empyema. Otherwise, similar appearance of the chest with enlarged main and central pulmonary arteries suggesting pulmonary arterial hypertension and calcified pleural plaques bilaterally. Electronically Signed   By: Jacqulynn Cadet M.D.   On: 09/25/2015 20:08   ED ECG REPORT   Date: 09/25/2015  Rate: 77  Rhythm: normal sinus rhythm  QRS Axis: rightward axis  Intervals: normal  ST/T Wave abnormalities: Twave inversions V2 and V3. Poor R wave progression.   Conduction Disutrbances:none  Narrative Interpretation:   Old EKG Reviewed: changes noted  I have personally reviewed the EKG tracing and agree with the computerized printout as noted.   Visual Acuity Review  Right Eye Distance:   Left Eye Distance:   Bilateral Distance:    Right Eye Near:   Left Eye Near:    Bilateral Near:         MDM Acute on chronic pulmonary edema Dyspnea Abnormal chest xray Hypertension Abdominal pain  Patient will be transferred to the emergency department for Increasing chronic pulmonary edema with other abnormal chest findings associated with dyspnea at rest and on exertion. His also having generalized abdominal pain. Patient is alert, communicative, laughing at times. Does not appear to be in any acute distress while at rest. He is warm and  dry and stable. He may be transferred by shuttle.  Janne Napoleon, NP 09/25/15 2025

## 2015-09-25 NOTE — ED Provider Notes (Signed)
CSN: [id number]     Arrival date & time 09/25/15  2037 History  By signing my name below, I, Irene Pap, attest that this documentation has been prepared under the direction and in the presence of Baileigh Modisette, MD. Electronically Signed: Irene Pap, ED Scribe. 09/25/2015. 1:02 AM.  Chief Complaint  Patient presents with  . Abdominal Pain   Patient is a 54 y.o. male presenting with abdominal pain. The history is provided by a relative. The history is limited by a language barrier. No language interpreter was used.  Abdominal Pain Pain location:  Generalized Pain quality: cramping   Pain radiates to:  Does not radiate Pain severity:  Moderate Onset quality:  Gradual Duration:  1 day Timing:  Intermittent Progression:  Unchanged Chronicity:  Recurrent Context: not alcohol use   Worsened by:  Nothing tried Ineffective treatments:  None tried Associated symptoms: shortness of breath   Associated symptoms: no chills, no cough, no diarrhea, no dysuria, no fever, no nausea and no vomiting   Shortness of breath:    Severity:  Moderate   Onset quality:  Gradual   Duration:  3 weeks   Timing:  Constant   Progression:  Worsening Risk factors: no recent hospitalization   HPI Comments: [name] [name] is a 54 y.o. Male with a hx of chest effusion who presents to the Emergency Department complaining of gradually worsening, intermittent middle to upper abdominal "gas" pain onset 3 weeks ago. Pt was seen at [facility] urgent care earlier today for the same symptoms where a chest x-ray was performed and effusion was seen. Nephew states that pt does not see a regular doctor for this problem. Pt denies fever, nausea, vomiting, diarrhea, or dysuria.   Pt does not speak fluent English; translated by nephew. Dialect unamenable by interpreter  History reviewed. No pertinent past medical history. Past Surgical History  Procedure Laterality Date  . Kidney surgery     No family history on file. Social  History  Substance Use Topics  . Smoking status: Never Smoker   . Smokeless tobacco: None  . Alcohol Use: Yes    Review of Systems  Constitutional: Negative for fever and chills.  Respiratory: Positive for shortness of breath. Negative for cough and wheezing.   Gastrointestinal: Positive for abdominal pain. Negative for nausea, vomiting and diarrhea.  Genitourinary: Negative for dysuria.  All other systems reviewed and are negative.  Allergies  Review of patient's allergies indicates no known allergies.  Home Medications   Prior to Admission medications   Medication Sig Start Date End Date Taking? Authorizing Provider  azithromycin (ZITHROMAX Z-PAK) 250 MG tablet Take 1 tablet (250 mg total) by mouth daily. 500mg  PO day 1, then 250mg  PO days 205 11/11/12   Noemi Chapel, MD  benzonatate (TESSALON) 100 MG capsule Take 1 capsule (100 mg total) by mouth every 8 (eight) hours. 11/11/12   Noemi Chapel, MD   BP 155/107 mmHg  Pulse 77  Temp(Src) 97.9 F (36.6 C) (Oral)  Resp 16  Wt 93 lb 2 oz (42.241 kg)  SpO2 95% Physical Exam  Constitutional: He is oriented to person, place, and time. He appears well-developed and well-nourished.  HENT:  Head: Normocephalic and atraumatic.  Eyes: EOM are normal. Pupils are equal, round, and reactive to light.  Neck: Normal range of motion. Neck supple.  Cardiovascular: Normal rate, regular rhythm and normal heart sounds.  Exam reveals no gallop and no friction rub.   No murmur heard. Pulmonary/Chest: Effort normal and breath  sounds normal. He has no wheezes.  Abdominal: Soft. Bowel sounds are normal. He exhibits no mass. There is no tenderness. There is no rebound and no guarding.  Gassy throughout  Musculoskeletal: Normal range of motion.  Neurological: He is alert and oriented to person, place, and time. He has normal reflexes.  Skin: Skin is warm and dry.  Psychiatric: He has a normal mood and affect. His behavior is normal.  Nursing note and  vitals reviewed.   ED Course  Procedures (including critical care time) DIAGNOSTIC STUDIES: Oxygen Saturation is 95% on RA, adequate by my interpretation.    COORDINATION OF CARE: 11:56 PM-Discussed treatment plan which includes CT angio and labs with family at bedside and family agreed to plan.    Labs Review Labs Reviewed  COMPREHENSIVE METABOLIC PANEL - Abnormal; Notable for the following:    Chloride 100 (*)    CO2 33 (*)    Calcium 8.5 (*)    Total Protein 8.9 (*)    Albumin 2.7 (*)    All other components within normal limits  LIPASE, BLOOD  CBC  URINALYSIS, ROUTINE W REFLEX MICROSCOPIC (NOT AT [facility])  Randolm Idol, ED    Imaging Review Dg Chest 2 View  09/25/2015  CLINICAL DATA:  54 year old male with chest pain EXAM: CHEST  2 VIEW COMPARISON:  Prior chest x-ray 11/10/2012 FINDINGS: Compared to prior imaging there has been slight interval increase in the volume of the loculated left pleural effusion which now has a convex margin at the superior extent. There is associated atelectasis of the adjacent lung. Stable calcified pleural plaques in the right mid and lower thorax. Cardiac and mediastinal contours are unchanged. The main and central pulmonary arteries are markedly enlarged. No pneumothorax. No new focal airspace consolidation. Osseous structures are intact and unremarkable. IMPRESSION: Compared to 11/10/2012 the chronic loculated left pleural effusion has enlarged and developed more convex margins suggesting the possibility of superinfection or development of empyema. Otherwise, similar appearance of the chest with enlarged main and central pulmonary arteries suggesting pulmonary arterial hypertension and calcified pleural plaques bilaterally. Electronically Signed   By: Jacqulynn Cadet M.D.   On: 09/25/2015 20:08   Ct Angio Chest Pe W/cm &/or Wo Cm  09/26/2015  CLINICAL DATA:  Subacute onset of intermittent progressive shortness of breath, and dyspnea on exertion  and rest. Midsternal chest burning. Initial encounter. EXAM: CT ANGIOGRAPHY CHEST WITH CONTRAST TECHNIQUE: Multidetector CT imaging of the chest was performed using the standard protocol during bolus administration of intravenous contrast. Multiplanar CT image reconstructions and MIPs were obtained to evaluate the vascular anatomy. CONTRAST:  58mL OMNIPAQUE IOHEXOL 350 MG/ML SOLN COMPARISON:  Chest radiograph performed 09/25/2015, and CT of the chest performed 04/17/2011 FINDINGS: There is no evidence of pulmonary embolus. The patient's loculated complex left-sided pleural effusion has increased mildly in size, with minimal air in the collection, raising concern for superinfection or evolving empyema. Scattered calcified pleural plaques raise question for underlying prior asbestos exposure. Mild scarring is again noted at the right lung base. Mild emphysematous change is noted at the upper lung lobes. There is no evidence of pneumothorax. No masses are identified; no abnormal focal contrast enhancement is seen. There is dilatation of the main pulmonary artery, measuring 4.8 cm in diameter, raising question for underlying pulmonary arterial hypertension. No pericardial effusion is identified. The great vessels are grossly unremarkable. No mediastinal lymphadenopathy is seen. No axillary lymphadenopathy is seen. The visualized portions of the thyroid gland are unremarkable in appearance.  There is reflux of contrast into the hepatic veins and IVC. The visualized portions of the liver and spleen are unremarkable. The visualized portions of the pancreas, gallbladder, stomach, adrenal glands and kidneys are within normal limits. No acute osseous abnormalities are seen. Review of the MIP images confirms the above findings. IMPRESSION: 1. No evidence of pulmonary embolus. 2. Loculated complex left-sided pleural effusion has increased mildly in size, with minimal air now seen in the collection, raising concern for  superinfection or evolving empyema. Would correlate clinically. 3. Dilatation of the main pulmonary artery, measuring 4.8 cm in diameter, raising question for underlying pulmonary arterial hypertension. 4. Scattered calcified pleural plaques raise question for underlying prior asbestos exposure. Mild scarring at the right lung base. 5. Mild emphysematous change at the upper lung lobes. Electronically Signed   By: Garald Balding M.D.   On: 09/26/2015 00:57   I have personally reviewed and evaluated these images and lab results as part of my medical decision-making.   EKG Interpretation   Date/Time:  Monday September 25 2015 23:39:46 EST Ventricular Rate:  97 PR Interval:  170 QRS Duration: 84 QT Interval:  379 QTC Calculation: 481 R Axis:   96 Text Interpretation:  Sinus rhythm Probable left ventricular hypertrophy  Confirmed by [facility]  MD, Emmaline Kluver ([id number]) on 09/25/2015 11:49:21 PM      MDM   Final diagnoses:  None    [facility] will consult in am  obs med surg per Eudelia Bunch    I personally performed the services described in this documentation, which was scribed in my presence. The recorded information has been reviewed and is accurate.      Veatrice Kells, MD 09/26/15 [id number]

## 2015-09-25 NOTE — ED Notes (Signed)
Pt native language is Mike Gip, a [place]. Does not understand [place]. Can understand some English. Has nephew present at bedside.

## 2015-09-26 ENCOUNTER — Encounter (HOSPITAL_COMMUNITY): Payer: Self-pay

## 2015-09-26 ENCOUNTER — Emergency Department (HOSPITAL_COMMUNITY): Payer: BLUE CROSS/BLUE SHIELD

## 2015-09-26 DIAGNOSIS — R109 Unspecified abdominal pain: Secondary | ICD-10-CM | POA: Diagnosis present

## 2015-09-26 DIAGNOSIS — J948 Other specified pleural conditions: Secondary | ICD-10-CM

## 2015-09-26 DIAGNOSIS — J90 Pleural effusion, not elsewhere classified: Secondary | ICD-10-CM | POA: Diagnosis not present

## 2015-09-26 LAB — PROTIME-INR
INR: 1.16 (ref 0.00–1.49)
PROTHROMBIN TIME: 15 s (ref 11.6–15.2)

## 2015-09-26 LAB — APTT: aPTT: 32 seconds (ref 24–37)

## 2015-09-26 LAB — LACTATE DEHYDROGENASE: LDH: 164 U/L (ref 98–192)

## 2015-09-26 MED ORDER — ENOXAPARIN SODIUM 30 MG/0.3ML ~~LOC~~ SOLN
30.0000 mg | SUBCUTANEOUS | Status: DC
  Administered 2015-09-26: 30 mg via SUBCUTANEOUS
  Filled 2015-09-26: qty 0.3

## 2015-09-26 MED ORDER — ENOXAPARIN SODIUM 30 MG/0.3ML ~~LOC~~ SOLN
30.0000 mg | SUBCUTANEOUS | Status: DC
  Administered 2015-09-28 – 2015-09-30 (×3): 30 mg via SUBCUTANEOUS
  Filled 2015-09-26 (×4): qty 0.3

## 2015-09-26 MED ORDER — DICYCLOMINE HCL 10 MG PO CAPS
10.0000 mg | ORAL_CAPSULE | Freq: Three times a day (TID) | ORAL | Status: DC
  Administered 2015-09-26 – 2015-09-30 (×16): 10 mg via ORAL
  Filled 2015-09-26 (×17): qty 1

## 2015-09-26 MED ORDER — KETOROLAC TROMETHAMINE 30 MG/ML IJ SOLN
30.0000 mg | Freq: Once | INTRAMUSCULAR | Status: AC
  Administered 2015-09-26: 30 mg via INTRAVENOUS
  Filled 2015-09-26: qty 1

## 2015-09-26 MED ORDER — LOPERAMIDE HCL 2 MG PO CAPS
2.0000 mg | ORAL_CAPSULE | ORAL | Status: DC | PRN
  Filled 2015-09-26: qty 1

## 2015-09-26 MED ORDER — INFLUENZA VAC SPLIT QUAD 0.5 ML IM SUSY
0.5000 mL | PREFILLED_SYRINGE | INTRAMUSCULAR | Status: AC
  Administered 2015-09-27: 0.5 mL via INTRAMUSCULAR
  Filled 2015-09-26: qty 0.5

## 2015-09-26 MED ORDER — IOHEXOL 350 MG/ML SOLN
80.0000 mL | Freq: Once | INTRAVENOUS | Status: AC | PRN
  Administered 2015-09-26: 80 mL via INTRAVENOUS

## 2015-09-26 NOTE — ED Notes (Signed)
Patient transported to CT 

## 2015-09-26 NOTE — Progress Notes (Addendum)
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 In the ED patient had a CT scan of his chest showing nucleated left-sided pleural effusion which seems to have worsened since prior. Admitted to hospital service and resting surgery consulted.  Assessment/Plan: Left lobe related pleural effusion Currently stable on 2 L via nasal cannula. CT surgery consult appreciated. Recommends possibility of an evolving empyema. Recommend CT guided thoracentesis and drainage with a pigtail and sent for culture and cytology.  Follow-up with results. Check 2-D echo.   Abdominal cramps or diarrhea Appears chronic . Stool for ova  and parasite. Afebrile and no leukocytosis When necessary loperamide and dicyclomine   DVT prophylaxis: Subcutaneous Lovenox Diet: Regular  Code Status: Full code Family Communication: girlfriend at bedside Disposition Plan: Home once workup completed and symptoms improved.   Consultants:  Dr Servando Snare  Procedures:  IR left thoracentesis on 11/23  Antibiotics:  none  HPI/Subjective: In and examined. Speak some English. Denies any chest pain or worsened shortness of breath.. Reports abdominal cramping.  Objective: Filed Vitals:   09/26/15 0446 09/26/15 1319  BP: 134/90 124/70  Pulse: 73 68  Temp: 98 F (36.7 C) 98.3 F (36.8 C)  Resp: 16 18    Intake/Output Summary (Last 24 hours) at 09/26/15 1345 Last data filed at 09/26/15 1108  Gross per 24 hour  Intake      0 ml  Output    200 ml  Net   -200 ml   Filed Weights   09/25/15 2049 09/26/15 0444  Weight: 42.241 kg (93 lb 2 oz) 42.23 kg (93 lb 1.6 oz)    Exam:   General:  Middle aged thin built male in no distress  HEENT:  No pallor, moist oral mucosa  Chest: Diminished breath sounds over left lung, no added sounds  CVS: S1 and S2, no murmurs rub or gallop  GI: Soft, nondistended, nontender, bowel sounds present  Musculoskeletal: Warm, no edema  CNS: Alert and oriented    Data Reviewed: Basic Metabolic Panel:  Recent Labs Lab 09/25/15 2106  NA 138  K 4.0  CL 100*  CO2 33*  GLUCOSE 88  BUN 9  CREATININE 1.09  CALCIUM 8.5*   Liver Function Tests:  Recent Labs Lab 09/25/15 2106  AST 30  ALT 22  ALKPHOS 80  BILITOT 0.5  PROT 8.9*  ALBUMIN 2.7*    Recent Labs Lab 09/25/15 2106  LIPASE 37   No results for input(s): AMMONIA in the last 168 hours. CBC:  Recent Labs Lab 09/25/15 2106  WBC 5.0  HGB 15.5  HCT 45.6  MCV 87.0  PLT 235   Cardiac Enzymes: No results for input(s): CKTOTAL, CKMB, CKMBINDEX, TROPONINI in the last 168 hours. BNP (last 3 results) No results for input(s): BNP in the last 8760 hours.  ProBNP (last 3 results) No results for input(s): PROBNP in the last 8760 hours.  CBG: No results for input(s): GLUCAP in the last 168 hours.  No results found for this or any previous visit (from the past 240 hour(s)).   Studies: Dg Chest 2 View  09/25/2015  CLINICAL DATA:  54 year old male with chest pain EXAM: CHEST  2 VIEW COMPARISON:  Prior chest x-ray 11/10/2012 FINDINGS: Compared to prior imaging there has  been slight interval increase in the volume of the loculated left pleural effusion which now has a convex margin at the superior extent. There is associated atelectasis of the adjacent lung. Stable calcified pleural plaques in the right mid and lower thorax. Cardiac and mediastinal contours are unchanged. The main and central pulmonary arteries are markedly enlarged. No pneumothorax. No new focal airspace consolidation. Osseous structures are intact and unremarkable. IMPRESSION: Compared to 11/10/2012 the chronic loculated left pleural effusion has enlarged and  developed more convex margins suggesting the possibility of superinfection or development of empyema. Otherwise, similar appearance of the chest with enlarged main and central pulmonary arteries suggesting pulmonary arterial hypertension and calcified pleural plaques bilaterally. Electronically Signed   By: Jacqulynn Cadet M.D.   On: 09/25/2015 20:08   Ct Angio Chest Pe W/cm &/or Wo Cm  09/26/2015  CLINICAL DATA:  Subacute onset of intermittent progressive shortness of breath, and dyspnea on exertion and rest. Midsternal chest burning. Initial encounter. EXAM: CT ANGIOGRAPHY CHEST WITH CONTRAST TECHNIQUE: Multidetector CT imaging of the chest was performed using the standard protocol during bolus administration of intravenous contrast. Multiplanar CT image reconstructions and MIPs were obtained to evaluate the vascular anatomy. CONTRAST:  47mL OMNIPAQUE IOHEXOL 350 MG/ML SOLN COMPARISON:  Chest radiograph performed 09/25/2015, and CT of the chest performed 04/17/2011 FINDINGS: There is no evidence of pulmonary embolus. The patient's loculated complex left-sided pleural effusion has increased mildly in size, with minimal air in the collection, raising concern for superinfection or evolving empyema. Scattered calcified pleural plaques raise question for underlying prior asbestos exposure. Mild scarring is again noted at the right lung base. Mild emphysematous change is noted at the upper lung lobes. There is no evidence of pneumothorax. No masses are identified; no abnormal focal contrast enhancement is seen. There is dilatation of the main pulmonary artery, measuring 4.8 cm in diameter, raising question for underlying pulmonary arterial hypertension. No pericardial effusion is identified. The great vessels are grossly unremarkable. No mediastinal lymphadenopathy is seen. No axillary lymphadenopathy is seen. The visualized portions of the thyroid gland are unremarkable in appearance. There is reflux of contrast  into the hepatic veins and IVC. The visualized portions of the liver and spleen are unremarkable. The visualized portions of the pancreas, gallbladder, stomach, adrenal glands and kidneys are within normal limits. No acute osseous abnormalities are seen. Review of the MIP images confirms the above findings. IMPRESSION: 1. No evidence of pulmonary embolus. 2. Loculated complex left-sided pleural effusion has increased mildly in size, with minimal air now seen in the collection, raising concern for superinfection or evolving empyema. Would correlate clinically. 3. Dilatation of the main pulmonary artery, measuring 4.8 cm in diameter, raising question for underlying pulmonary arterial hypertension. 4. Scattered calcified pleural plaques raise question for underlying prior asbestos exposure. Mild scarring at the right lung base. 5. Mild emphysematous change at the upper lung lobes. Electronically Signed   By: Garald Balding M.D.   On: 09/26/2015 00:57    Scheduled Meds: . dicyclomine  10 mg Oral TID AC & HS  . enoxaparin (LOVENOX) injection  30 mg Subcutaneous Q24H  . [START ON 09/27/2015] Influenza vac split quadrivalent PF  0.5 mL Intramuscular Tomorrow-1000   Continuous Infusions:    Time spent: [place], [place]  [facility] Pager [phone]. If 7PM-7AM, please contact night-coverage at [email or web address], password [facility] 09/26/2015, 1:45 PM

## 2015-09-26 NOTE — Progress Notes (Signed)
Lab staff called regarding order for Lactace dehydrogenase, unable to perform due to inappropriate specimen. Tylene Fantasia NP paged and notified.

## 2015-09-26 NOTE — ED Notes (Signed)
Contacted nephew by phone to assist in translation with admitting provider. Attempts to find qualified translator via [employer] were unsuccessful.

## 2015-09-26 NOTE — ED Notes (Addendum)
Report called. Receiving RN wanted to clarify his placement as a med-surg pt on a tele floor.

## 2015-09-26 NOTE — Progress Notes (Signed)
[name] [name] is a 54 y.o. male patient admitted from ED awake, alert - oriented  X 4 - no acute distress noted.  VSS - Blood pressure 134/90, pulse 73, temperature 98 F (36.7 C), temperature source Oral, resp. rate 16, weight 42.23 kg (93 lb 1.6 oz), SpO2 99 %.    IV in place, occlusive dsg intact without redness.  Orientation to room, and floor completed with information packet given to patient/family.  Patient declined safety video at this time.  Admission INP armband ID verified with patient/family, and in place.   SR up x 2, fall assessment complete, with patient and family able to somewhat verbalize understanding of risk associated with falls, and verbalized understanding to call nsg before up out of bed.  Call light within reach, patient able to voice, and demonstrate understanding.  Skin, clean-dry- intact without evidence of bruising, or skin tears.   No evidence of skin break down noted on exam.   Will wait for nephew or interpreter to continue admission assessment due to language barrier.  Will continue to evaluate and treat per MD orders.  Elon Jester, RN 09/26/2015 4:50 AM

## 2015-09-26 NOTE — Consult Note (Signed)
Chief Complaint: Patient was seen in consultation today for Left pleural effusion aspiration; drain vs biopsy Chief Complaint  Patient presents with  . Abdominal Pain   at the request of Dr Servando Snare  Referring Physician(s): Dr Servando Snare  History of Present Illness: [name] [name] is a 54 y.o. male   Pt with onset worsening shortness of breath and abdominal pain x 3-4 days Presented to ED early this am Afeb; wbc wnl Has had "drain in left lung before" per pt CT: 11/22 IMPRESSION: 1. No evidence of pulmonary embolus. 2. Loculated complex left-sided pleural effusion has increased mildly in size, with minimal air now seen in the collection, raising concern for superinfection or evolving empyema. Would correlate clinically. 3. Dilatation of the main pulmonary artery, measuring 4.8 cm in diameter, raising question for underlying pulmonary arterial hypertension. 4. Scattered calcified pleural plaques raise question for underlying prior asbestos exposure. Mild scarring at the right lung base. 5. Mild emphysematous change at the upper lung lobes.  Pt has hx chronic Left pleural effusion---noted imaging from 2012 Dr Servando Snare has requested IR consult for possible aspiration/drain placement vs biopsy if area is solid Dr Barbie Banner has reviewed imaging and approves procedure Now scheduled for procedure 11/23  History reviewed. No pertinent past medical history.  Past Surgical History  Procedure Laterality Date  . Kidney surgery      Allergies: Review of patient's allergies indicates no known allergies.  Medications: Prior to Admission medications   Not on File     History reviewed. No pertinent family history.  Social History   Social History  . Marital Status: Single    Spouse Name: N/A  . Number of Children: N/A  . Years of Education: N/A   Social History Main Topics  . Smoking status: Never Smoker   . Smokeless tobacco: None  . Alcohol Use: Yes  . Drug Use: No  .  Sexual Activity: Not Asked   Other Topics Concern  . None   Social History Narrative    Review of Systems: A 12 point ROS discussed and pertinent positives are indicated in the HPI above.  All other systems are negative.  Review of Systems  Constitutional: Positive for activity change and fatigue. Negative for fever, appetite change and unexpected weight change.  Respiratory: Positive for shortness of breath and wheezing.   Cardiovascular: Positive for chest pain.  Gastrointestinal: Negative for abdominal pain.  Musculoskeletal: Negative for back pain.  Neurological: Negative for weakness.  Psychiatric/Behavioral: Negative for behavioral problems and confusion.    Vital Signs: BP 124/70 mmHg  Pulse 68  Temp(Src) 98.3 F (36.8 C) (Oral)  Resp 18  Wt 93 lb 1.6 oz (42.23 kg)  SpO2 100%  Physical Exam  Constitutional: He is oriented to person, place, and time.  Cardiovascular: Normal rate, regular rhythm and normal heart sounds.   Pulmonary/Chest: Effort normal. He has wheezes.  Abdominal: Soft. Bowel sounds are normal. There is no tenderness.  Musculoskeletal: Normal range of motion.  Neurological: He is alert and oriented to person, place, and time.  Skin: Skin is warm and dry.  Psychiatric: He has a normal mood and affect. His behavior is normal. Judgment and thought content normal.  Nursing note and vitals reviewed.   Mallampati Score:  MD Evaluation Airway: WNL Heart: WNL Abdomen: WNL Chest/ Lungs: WNL ASA  Classification: 3 Mallampati/Airway Score: One  Imaging: Dg Chest 2 View  09/25/2015  CLINICAL DATA:  54 year old male with chest pain EXAM: CHEST  2 VIEW  COMPARISON:  Prior chest x-ray 11/10/2012 FINDINGS: Compared to prior imaging there has been slight interval increase in the volume of the loculated left pleural effusion which now has a convex margin at the superior extent. There is associated atelectasis of the adjacent lung. Stable calcified pleural  plaques in the right mid and lower thorax. Cardiac and mediastinal contours are unchanged. The main and central pulmonary arteries are markedly enlarged. No pneumothorax. No new focal airspace consolidation. Osseous structures are intact and unremarkable. IMPRESSION: Compared to 11/10/2012 the chronic loculated left pleural effusion has enlarged and developed more convex margins suggesting the possibility of superinfection or development of empyema. Otherwise, similar appearance of the chest with enlarged main and central pulmonary arteries suggesting pulmonary arterial hypertension and calcified pleural plaques bilaterally. Electronically Signed   By: Jacqulynn Cadet M.D.   On: 09/25/2015 20:08   Ct Angio Chest Pe W/cm &/or Wo Cm  09/26/2015  CLINICAL DATA:  Subacute onset of intermittent progressive shortness of breath, and dyspnea on exertion and rest. Midsternal chest burning. Initial encounter. EXAM: CT ANGIOGRAPHY CHEST WITH CONTRAST TECHNIQUE: Multidetector CT imaging of the chest was performed using the standard protocol during bolus administration of intravenous contrast. Multiplanar CT image reconstructions and MIPs were obtained to evaluate the vascular anatomy. CONTRAST:  34mL OMNIPAQUE IOHEXOL 350 MG/ML SOLN COMPARISON:  Chest radiograph performed 09/25/2015, and CT of the chest performed 04/17/2011 FINDINGS: There is no evidence of pulmonary embolus. The patient's loculated complex left-sided pleural effusion has increased mildly in size, with minimal air in the collection, raising concern for superinfection or evolving empyema. Scattered calcified pleural plaques raise question for underlying prior asbestos exposure. Mild scarring is again noted at the right lung base. Mild emphysematous change is noted at the upper lung lobes. There is no evidence of pneumothorax. No masses are identified; no abnormal focal contrast enhancement is seen. There is dilatation of the main pulmonary artery,  measuring 4.8 cm in diameter, raising question for underlying pulmonary arterial hypertension. No pericardial effusion is identified. The great vessels are grossly unremarkable. No mediastinal lymphadenopathy is seen. No axillary lymphadenopathy is seen. The visualized portions of the thyroid gland are unremarkable in appearance. There is reflux of contrast into the hepatic veins and IVC. The visualized portions of the liver and spleen are unremarkable. The visualized portions of the pancreas, gallbladder, stomach, adrenal glands and kidneys are within normal limits. No acute osseous abnormalities are seen. Review of the MIP images confirms the above findings. IMPRESSION: 1. No evidence of pulmonary embolus. 2. Loculated complex left-sided pleural effusion has increased mildly in size, with minimal air now seen in the collection, raising concern for superinfection or evolving empyema. Would correlate clinically. 3. Dilatation of the main pulmonary artery, measuring 4.8 cm in diameter, raising question for underlying pulmonary arterial hypertension. 4. Scattered calcified pleural plaques raise question for underlying prior asbestos exposure. Mild scarring at the right lung base. 5. Mild emphysematous change at the upper lung lobes. Electronically Signed   By: Garald Balding M.D.   On: 09/26/2015 00:57    Labs:  CBC:  Recent Labs  09/25/15 2106  WBC 5.0  HGB 15.5  HCT 45.6  PLT 235    COAGS: No results for input(s): INR, APTT in the last 8760 hours.  BMP:  Recent Labs  09/25/15 2106  NA 138  K 4.0  CL 100*  CO2 33*  GLUCOSE 88  BUN 9  CALCIUM 8.5*  CREATININE 1.09  GFRNONAA >60  GFRAA >60  LIVER FUNCTION TESTS:  Recent Labs  09/25/15 2106  BILITOT 0.5  AST 30  ALT 22  ALKPHOS 80  PROT 8.9*  ALBUMIN 2.7*    TUMOR MARKERS: No results for input(s): AFPTM, CEA, CA199, CHROMGRNA in the last 8760 hours.  Assessment and Plan:  Chronic Left pleural  effusion Loculated Scheduled for aspiration/ drain placement Vs biopsy of area in Radiology 11/23 Risks and Benefits discussed with the patient including bleeding, infection, damage to adjacent structures,  and sepsis. All of the patient's questions were answered, patient is agreeable to proceed. Risks and Benefits discussed with the patient including, but not limited to bleeding, hemoptysis, respiratory failure requiring intubation, infection, pneumothorax requiring chest tube placement, stroke from air embolism or even death. All of the patient's questions were answered, patient is agreeable to proceed. Consent signed and in chart   Thank you for this interesting consult.  I greatly enjoyed meeting Melesio Etue and look forward to participating in their care.  A copy of this report was sent to the requesting provider on this date.  Signed: Alydia Gosser A 09/26/2015, 3:25 PM   I spent a total of 40 Minutes    in face to face in clinical consultation, greater than 50% of which was counseling/coordinating care for Left pleural effusion aspiration/drain/bx

## 2015-09-26 NOTE — Care Management Note (Addendum)
Case Management Note  Patient Details  Name: [name] [name] MRN: [id number] Date of Birth: [birth year]  Subjective/Objective:    Date: 09/26/15 Spoke with patient at the bedside along with girlfriend.  Introduced self as Tourist information centre manager and explained role in discharge planning and how to be reached.  Verified patient lives in town, alone with girlfriend.  Expressed potential need for no other DME.  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  Patient denied needing help with their medication.  Patient  is driven by girlfriend to MD appointments.  Verified patient has PCP ,patient will be new patient with Dr. London Pepper. Referral given to [facility] with [facility] for [facility] if patient needs drain care at home.  Soc will begin 24-48 hrs post dc.   Plan: CM will continue to follow for discharge planning and [facility] resources.                 Action/Plan:   Expected Discharge Date:                  Expected Discharge Plan:  [place]  In-House Referral:     Discharge planning Services  CM Consult  Post Acute Care Choice:    Choice offered to:     DME Arranged:    DME Agency:     HH Arranged:    [place] Agency:     Status of Service:  In process, will continue to follow  Medicare Important Message Given:    Date Medicare IM Given:    Medicare IM give by:    Date Additional Medicare IM Given:    Additional Medicare Important Message give by:     If discussed at [place] of Stay Meetings, dates discussed:    Additional Comments:  Zenon Mayo, RN 09/26/2015, 4:05 PM

## 2015-09-26 NOTE — Progress Notes (Signed)
Received report from Freida Busman, [place] in the ED at 0350.

## 2015-09-26 NOTE — H&P (Signed)
History and Physical  Patient Name: [name] [name]     [id number]    DOB: 20-Sep-1961    DOA: 09/25/2015 Referring physician: April Palumbo, mD PCP: No PCP Per Patient      Chief Complaint: Abdominal bloating and diarrhea  HPI: [name] [name] is a 54 y.o. male with a past medical history significant for chronic left loculated left pleural effusion s/p thoracotomy in 2003 who presents with bloating, abdominal cramps and diarrhea, but also increased dyspnea.  The patient speaks only a [place], for which a telephonic interpreter was sought unsuccessfully overnight.  The only obtainable interpreter was the patient's nephew, who is a poor historian and was not available to me in person but only over the phone.  Evidently over the last 3 weeks the patient an increase in dyspnea and decreased exercise tolerance that his nephew describes as "like asthma."  He has been able to work (he works in an active job), but feels a discomfort in his chest (that is hard to translate, he motions radiating pain up the esophagus). He went to urgent care for this today where he had diminished breath sounds and a chest x-ray showed his old large left pleural effusion and the patient was referred immediately to the ER.   In the ED, the patient had low normal SpO2 and a loculated left sided pleural effusion on CT angiogram.  The case was discussed with CT surgery on-call, who recommended admission.  The patient did not appear ill and he was not in pain, nor did he have leukocytosis or cough productive of sputum.     Review of Systems:  Within the limits of poor translation, the patient complains of intermittent shortness of breath, chest discomfort, and also abdominal cramps, diarrhea, and bloating but he denies fever, chills, sputum and all other systems negative except as just noted or noted in the history of present illness.   Allergies: No Known Allergies  Home medications: None   Past medical  history: 1. Old loculated pleural effusion      A discharge summary from 2012 when the patient had pneumonia states that he had "bronchoscopy with left thoracotomy and decortication per Dr. Servando Snare" of CT surgery in 2003.  He had negative AFB times three in 2012  Past surgical history: 1. Thoracotomy?  Family history:  Unable to obtain.    Social History:  Patient lives with his nephew.  No recent travel outside the [place].  THe patient works and is independent with all ADLs.  No recent food exposures that nephew knows about.  Municipal water.        Physical Exam: BP 148/90 mmHg  Pulse 82  Temp(Src) 97.9 F (36.6 C) (Oral)  Resp 21  Wt 42.241 kg (93 lb 2 oz)  SpO2 99% General appearance: Thin adult male, alert and in no acute distress.   Eyes: Anicteric, conjunctiva pink, lids and lashes normal.     ENT: No nasal deformity, discharge, or epistaxis.  OP moist without lesions.   Lymph: No cervical, supraclavicular or axillary lymphadenopathy. Skin: Warm and dry.   Cardiac: RRR, nl S1-S2, no murmurs appreciated.  No LE edema.   Respiratory: Normal respiratory rate and rhythm.  Diminished at left base.  Coarse airway sounds bilaterally, without wheezes.   Abdomen: Abdomen soft without rigidity.  Mild diffuse TTP. Bowel sounds hyperactive.  No ascites, distension.   MSK: No deformities or effusions. Neuro: Sensorium appears intact and responding to questions, attention normal.   Moves  all extremities equally and with normal coordination.    Psych: Affect normal.  No evidence of aural or visual hallucinations or delusions.       Labs on Admission:  The metabolic panel shows normal sodium, potassium, bicarbonate, and renal function. The transaminases and bilirubin are normal. The albumin is low at 2.7 g/dL. The lipase is normal. The troponin is negative. The urinalysis is clear.  The complete blood count shows no leukocytosis, anemia, or thrombocytopenia.   Radiological Exams  on Admission: Personally reviewed: Dg Chest 2 View 09/25/2015   Chronic left sided effusion, equivocally bigger than previous, but never resolved in previous CXR.     Ct Angio Chest Pe W/cm &/or Wo Cm 09/26/2015 IMPRESSION:  1. No evidence of pulmonary embolus.  2. Loculated complex left-sided pleural effusion has increased mildly in size, with minimal air now seen in the collection, raising concern for superinfection or evolving empyema. Would correlate clinically.  3. Dilatation of the main pulmonary artery, measuring 4.8 cm in diameter, raising question for underlying pulmonary arterial hypertension.  4. Scattered calcified pleural plaques raise question for underlying prior asbestos exposure. Mild scarring at the right lung base.  5. Mild emphysematous change at the upper lung lobes.  EKG: Independently reviewed. NSR.    Assessment/Plan 1. Left pleural effusion:  The patient appears to be having dyspnea and chest discomfort again related to his chronic loculated pleural effusion.  There are no signs at this time of systemic infection or empyema.  -Consult to CT surgery, appreciate recommendations -Monitor SPO2 and WBC, if worsening would be reasonable to start oral antibiotics      2. Abdominal cramps and diarrhea:  There are no red flag features. Fecal leukocytes and stool culture would be reasonable if this is indeed over 3 weeks of diarrhea. -Loperamide as needed for diarrhea   -Ova and parasites -Follow up as outpatient     DVT PPx: Lovenox Diet: Regular Consultants: CT surgery Code Status: Full Family Communication: Nephew by phone  Medical decision making: What exists of the patient's previous chart was reviewed in depth and the case was discussed with Dr. Randal Buba. Patient seen 2:20 AM on 09/26/2015.  Disposition Plan:  Admit for obsevation and consultation with CT surgery.  If cleared for discharge from surgical perspective, and remains without worsening  hypoxia, fever, or leukocytosis, would be ready for discharge with outpatient follow-up.      Edwin Dada [facility] Pager [phone]

## 2015-09-26 NOTE — Progress Notes (Signed)
[address]       [place] [address]             [phone]      Subjective:   Patient is a 53 y.o. male who presented to the emergency department this morning with c/o  abdominal pain, diarrhea and increasing dyspnea. They attempted to obtain a interpreter overnight as the patient speaks a [place] ease dialect called [place] but were unsuccessful. He is [place] and speaks and understands english fairly well. Patient came to this country in 1986 and has travelled from th [place] area.  Over the past approximate 3 weeks there has been notable increased dyspnea and decreased exercise tolerance which his nephew described as "like asthma". He has had chest discomfort with exertion with possible esophageal spasm  Symptoms. He is having bowel movements.  He presented to urgent care where a chest x-ray showed a large left pleural effusion and the patient was referred to the emergency department. The patient had a CT angiogram which revealed the following:  IMPRESSION: 1. No evidence of pulmonary embolus. 2. Loculated complex left-sided pleural effusion has increased mildly in size, with minimal air now seen in the collection, raising concern for superinfection or evolving empyema. Would correlate clinically. 3. Dilatation of the main pulmonary artery, measuring 4.8 cm in diameter, raising question for underlying pulmonary arterial hypertension. 4. Scattered calcified pleural plaques raise question for underlying prior asbestos exposure. Mild scarring at the right lung base. 5. Mild emphysematous change at the upper lung lobes.  He was admitted for further evaluation and treatment to include cardiothoracic surgical consultation. Of note the patient does not have a leukocytosis. He does not have a productive cough. He is afebrile and hemodynamically stable although at times hypertensive. Oxygen saturations are greater than 90 on room air. He is currently on 2  L nasal cannula with saturations at 99-100%. 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     Patient Active Problem List   Diagnosis Date Noted  . Abdominal cramps 09/26/2015  . Pleural effusion 09/26/2015  . Pleural effusion, left 09/26/2015  left thoracotomy, drainage of empyema 2003   History reviewed. No pertinent past medical history.  Past Surgical History  Procedure Laterality Date  . Kidney surgery      No prescriptions prior to admission   No Known Allergies  Social History  Substance Use Topics  . Smoking status: Never Smoker   . Smokeless tobacco: Not on file  . Alcohol Use: Yes    History reviewed. No pertinent family history.  Review of Systems Pertinent items are noted in HPI.  Objective:   Patient Vitals for the past 8 hrs:  BP Temp Temp src Pulse Resp SpO2 Weight  09/26/15 0446 134/90 mmHg 98 F (36.7 C) Oral 73 16 99 % -  09/26/15 0444 - - - - - - 93 lb 1.6 oz (42.23 kg)  09/26/15 0345 122/78 mmHg - - 67 19 99 % -  09/26/15 0230 125/80 mmHg - - 71 17 98 % -  09/26/15 0215 129/83 mmHg - - 75 20 98 % -  09/26/15 0145 126/85 mmHg - - 72 16 99 % -  09/26/15 0130 127/83 mmHg - - 75 20 98 % -  09/26/15 0100 130/90 mmHg - -  81 23 98 % -  09/26/15 0047 148/90 mmHg - - 82 21 99 % -  09/26/15 0015 144/97 mmHg - - 88 26 99 % -  09/26/15 0000 152/88 mmHg - - 92 23 100 % -   BP 134/90 mmHg  Pulse 73  Temp(Src) 98 F (36.7 C) (Oral)  Resp 16  Wt 93 lb 1.6 oz (42.23 kg)  SpO2 99%  General Appearance:    Alert, cooperative, no distress, appears stated age  Head:    Normocephalic, without obvious abnormality, atraumatic  Eyes:    PERRL, conjunctiva/corneas erythematous,           Throat:   Lips, mucosa, and tongue normal; teeth fair dentition and gums normal  Neck:   Supple, symmetrical,  trachea midline, no adenopathy;       thyroid:  No enlargement/tenderness/nodules; no carotid   bruit or JVD  Back:     Symmetric, no curvature, ROM normal, no CVA tenderness  Lungs:     Sl dim on left but mostly clear, no rales or ronchi or wheeze  Chest wall:    No tenderness or deformity  Heart:    Regular rate and rhythm, S1 and S2 normal, no murmur, rub   or gallop  Abdomen:     Soft, non-tender, bowel sounds active all four quadrants,    no masses, no organomegaly. Quite thin, aorta is palpable  Genitalia:    Not evaluated  Rectal:    Not evaluated  Extremities:   Extremities normal, atraumatic, no cyanosis or edema  Pulses:   2+ and symmetric all extremities  Skin:   Skin color, texture, turgor normal, no rashes or lesions  Lymph nodes:   Cervical, supraclavicular, and axillary nodes normal  Neurologic:   Grossly non focal    Data Review:  CBC:  Lab Results  Component Value Date   WBC 5.0 09/25/2015   RBC 5.24 09/25/2015   BMP:  Lab Results  Component Value Date   GLUCOSE 88 09/25/2015   CO2 33* 09/25/2015   BUN 9 09/25/2015   CREATININE 1.09 09/25/2015   CALCIUM 8.5* 09/25/2015   Coagulation: No results found for: INR, APTT Cardiac markers: No results found for: CKMB, TROPONINT, MYOGLOBIN ABGs: No results found for: PH  ECG: LVH Chest X-Ray: Dg Chest 2 View  09/25/2015  CLINICAL DATA:  54 year old male with chest pain EXAM: CHEST  2 VIEW COMPARISON:  Prior chest x-ray 11/10/2012 FINDINGS: Compared to prior imaging there has been slight interval increase in the volume of the loculated left pleural effusion which now has a convex margin at the superior extent. There is associated atelectasis of the adjacent lung. Stable calcified pleural plaques in the right mid and lower thorax. Cardiac and mediastinal contours are unchanged. The main and central pulmonary arteries are markedly enlarged. No pneumothorax. No new focal airspace consolidation. Osseous structures are  intact and unremarkable. IMPRESSION: Compared to 11/10/2012 the chronic loculated left pleural effusion has enlarged and developed more convex margins suggesting the possibility of superinfection or development of empyema. Otherwise, similar appearance of the chest with enlarged main and central pulmonary arteries suggesting pulmonary arterial hypertension and calcified pleural plaques bilaterally. Electronically Signed   By: Jacqulynn Cadet M.D.   On: 09/25/2015 20:08   Ct Angio Chest Pe W/cm &/or Wo Cm  09/26/2015  CLINICAL DATA:  Subacute onset of intermittent progressive shortness of breath, and dyspnea on exertion and rest. Midsternal chest burning. Initial encounter. EXAM: CT ANGIOGRAPHY CHEST  WITH CONTRAST TECHNIQUE: Multidetector CT imaging of the chest was performed using the standard protocol during bolus administration of intravenous contrast. Multiplanar CT image reconstructions and MIPs were obtained to evaluate the vascular anatomy. CONTRAST:  5mL OMNIPAQUE IOHEXOL 350 MG/ML SOLN COMPARISON:  Chest radiograph performed 09/25/2015, and CT of the chest performed 04/17/2011 FINDINGS: There is no evidence of pulmonary embolus. The patient's loculated complex left-sided pleural effusion has increased mildly in size, with minimal air in the collection, raising concern for superinfection or evolving empyema. Scattered calcified pleural plaques raise question for underlying prior asbestos exposure. Mild scarring is again noted at the right lung base. Mild emphysematous change is noted at the upper lung lobes. There is no evidence of pneumothorax. No masses are identified; no abnormal focal contrast enhancement is seen. There is dilatation of the main pulmonary artery, measuring 4.8 cm in diameter, raising question for underlying pulmonary arterial hypertension. No pericardial effusion is identified. The great vessels are grossly unremarkable. No mediastinal lymphadenopathy is seen. No axillary  lymphadenopathy is seen. The visualized portions of the thyroid gland are unremarkable in appearance. There is reflux of contrast into the hepatic veins and IVC. The visualized portions of the liver and spleen are unremarkable. The visualized portions of the pancreas, gallbladder, stomach, adrenal glands and kidneys are within normal limits. No acute osseous abnormalities are seen. Review of the MIP images confirms the above findings. IMPRESSION: 1. No evidence of pulmonary embolus. 2. Loculated complex left-sided pleural effusion has increased mildly in size, with minimal air now seen in the collection, raising concern for superinfection or evolving empyema. Would correlate clinically. 3. Dilatation of the main pulmonary artery, measuring 4.8 cm in diameter, raising question for underlying pulmonary arterial hypertension. 4. Scattered calcified pleural plaques raise question for underlying prior asbestos exposure. Mild scarring at the right lung base. 5. Mild emphysematous change at the upper lung lobes. Electronically Signed   By: Garald Balding M.D.   On: 09/26/2015 00:57   2014     2016:   Assessment:   Chronic loculated left pleural effusion. There is possibility this could be evolving to an empyema but appears generally well and not infected.  He has evidence of pulmonary HTN on CTA  and LVH and ? Left Atrial enlargement n EKG so echocardiogram may be indicated as diastolic dysfunction may be a contributor to his dyspnea.   Plan:   Plan CT directed toward left upper chest , thoracentesis , drainage with pig tail with cytology and culture on material drained . If solid core needle biopsy.  [name] MD      [address] [place] [address] Office [phone]   [place]

## 2015-09-26 NOTE — ED Notes (Signed)
MD at bedside. 

## 2015-09-26 NOTE — ED Notes (Signed)
Attempted to call report. Assigned RN busy and will return call.

## 2015-09-27 ENCOUNTER — Observation Stay (HOSPITAL_BASED_OUTPATIENT_CLINIC_OR_DEPARTMENT_OTHER): Payer: BLUE CROSS/BLUE SHIELD

## 2015-09-27 ENCOUNTER — Observation Stay (HOSPITAL_COMMUNITY): Payer: BLUE CROSS/BLUE SHIELD

## 2015-09-27 DIAGNOSIS — R634 Abnormal weight loss: Secondary | ICD-10-CM

## 2015-09-27 DIAGNOSIS — R197 Diarrhea, unspecified: Secondary | ICD-10-CM

## 2015-09-27 DIAGNOSIS — R072 Precordial pain: Secondary | ICD-10-CM | POA: Diagnosis not present

## 2015-09-27 DIAGNOSIS — R0600 Dyspnea, unspecified: Secondary | ICD-10-CM | POA: Diagnosis not present

## 2015-09-27 DIAGNOSIS — R109 Unspecified abdominal pain: Secondary | ICD-10-CM | POA: Diagnosis not present

## 2015-09-27 DIAGNOSIS — J948 Other specified pleural conditions: Secondary | ICD-10-CM | POA: Diagnosis not present

## 2015-09-27 DIAGNOSIS — R079 Chest pain, unspecified: Secondary | ICD-10-CM

## 2015-09-27 DIAGNOSIS — J90 Pleural effusion, not elsewhere classified: Secondary | ICD-10-CM | POA: Diagnosis not present

## 2015-09-27 LAB — TROPONIN I: Troponin I: <0.03 ng/mL (ref <0.031)

## 2015-09-27 MED ORDER — FENTANYL CITRATE (PF) 100 MCG/2ML IJ SOLN
INTRAMUSCULAR | Status: AC | PRN
  Administered 2015-09-27 (×2): 50 ug via INTRAVENOUS

## 2015-09-27 MED ORDER — MIDAZOLAM HCL 2 MG/2ML IJ SOLN
INTRAMUSCULAR | Status: AC
  Filled 2015-09-27: qty 2

## 2015-09-27 MED ORDER — LIDOCAINE HCL (PF) 1 % IJ SOLN
INTRAMUSCULAR | Status: AC
  Filled 2015-09-27: qty 30

## 2015-09-27 MED ORDER — FENTANYL CITRATE (PF) 100 MCG/2ML IJ SOLN
INTRAMUSCULAR | Status: AC
  Filled 2015-09-27: qty 2

## 2015-09-27 MED ORDER — MIDAZOLAM HCL 2 MG/2ML IJ SOLN
INTRAMUSCULAR | Status: AC | PRN
  Administered 2015-09-27 (×2): 1 mg via INTRAVENOUS

## 2015-09-27 NOTE — Sedation Documentation (Signed)
Pt. Sleepy, will continue  monitoring until more alert.

## 2015-09-27 NOTE — Progress Notes (Signed)
Patient ID: [name] [name], male   DOB: 07-Sep-1961, 54 y.o.   MRN: [id number]      [address]       [place] [address]             [phone]                        Subjective: No complaints this am, bowels moved today  Objective: Vital signs in last 24 hours: Patient Vitals for the past 24 hrs:  BP Temp Temp src Pulse Resp SpO2 Height  09/27/15 0600 111/68 mmHg 98 F (36.7 C) Oral 71 - 97 % -  09/26/15 2205 (!) 101/59 mmHg 98.2 F (36.8 C) Oral 73 16 100 % 5' (1.524 m)  09/26/15 1319 124/70 mmHg 98.3 F (36.8 C) Oral 68 18 100 % -    Filed Weights   09/25/15 2049 09/26/15 0444  Weight: 93 lb 2 oz (42.241 kg) 93 lb 1.6 oz (42.23 kg)    Hemodynamic parameters for last 24 hours:    Intake/Output from previous day: 11/22 0701 - 11/23 0700 In: 360 [P.O.:360] Out: 600 [Urine:600] Intake/Output this shift:    Scheduled Meds: . dicyclomine  10 mg Oral TID AC & HS  . enoxaparin (LOVENOX) injection  30 mg Subcutaneous Q24H   Continuous Infusions:  PRN Meds:.loperamide  General appearance: alert and cooperative Neurologic: intact Heart: regular rate and rhythm, S1, S2 normal, no murmur, click, rub or gallop Lungs: diminished breath sounds LLL Abdomen: soft, non-tender; bowel sounds normal; no masses,  no organomegaly Extremities: extremities normal, atraumatic, no cyanosis or edema and Homans sign is negative, no sign of DVT  Lab Results: CBC: Recent Labs  09/25/15 2106  WBC 5.0  HGB 15.5  HCT 45.6  PLT 235   BMET:  Recent Labs  09/25/15 2106  NA 138  K 4.0  CL 100*  CO2 33*  GLUCOSE 88  BUN 9  CREATININE 1.09  CALCIUM 8.5*    PT/INR:  Recent Labs  09/26/15 1437  LABPROT 15.0  INR 1.16     Radiology Dg Chest 2 View  09/25/2015  CLINICAL DATA:  54 year old male with chest pain EXAM: CHEST  2 VIEW COMPARISON:  Prior chest x-ray 11/10/2012 FINDINGS: Compared to prior imaging there has been slight interval increase in the volume  of the loculated left pleural effusion which now has a convex margin at the superior extent. There is associated atelectasis of the adjacent lung. Stable calcified pleural plaques in the right mid and lower thorax. Cardiac and mediastinal contours are unchanged. The main and central pulmonary arteries are markedly enlarged. No pneumothorax. No new focal airspace consolidation. Osseous structures are intact and unremarkable. IMPRESSION: Compared to 11/10/2012 the chronic loculated left pleural effusion has enlarged and developed more convex margins suggesting the possibility of superinfection or development of empyema. Otherwise, similar appearance of the chest with enlarged main and central pulmonary arteries suggesting pulmonary arterial hypertension and calcified pleural plaques bilaterally. Electronically Signed   By: Jacqulynn Cadet M.D.   On: 09/25/2015 20:08   Ct Angio Chest Pe W/cm &/or Wo Cm  09/26/2015  CLINICAL DATA:  Subacute onset of intermittent progressive shortness of breath, and dyspnea on exertion and rest. Midsternal chest burning. Initial encounter. EXAM: CT ANGIOGRAPHY CHEST WITH CONTRAST TECHNIQUE: Multidetector CT imaging of the chest was performed using the standard protocol during bolus administration of intravenous contrast. Multiplanar CT image reconstructions and MIPs were obtained  to evaluate the vascular anatomy. CONTRAST:  12mL OMNIPAQUE IOHEXOL 350 MG/ML SOLN COMPARISON:  Chest radiograph performed 09/25/2015, and CT of the chest performed 04/17/2011 FINDINGS: There is no evidence of pulmonary embolus. The patient's loculated complex left-sided pleural effusion has increased mildly in size, with minimal air in the collection, raising concern for superinfection or evolving empyema. Scattered calcified pleural plaques raise question for underlying prior asbestos exposure. Mild scarring is again noted at the right lung base. Mild emphysematous change is noted at the upper lung  lobes. There is no evidence of pneumothorax. No masses are identified; no abnormal focal contrast enhancement is seen. There is dilatation of the main pulmonary artery, measuring 4.8 cm in diameter, raising question for underlying pulmonary arterial hypertension. No pericardial effusion is identified. The great vessels are grossly unremarkable. No mediastinal lymphadenopathy is seen. No axillary lymphadenopathy is seen. The visualized portions of the thyroid gland are unremarkable in appearance. There is reflux of contrast into the hepatic veins and IVC. The visualized portions of the liver and spleen are unremarkable. The visualized portions of the pancreas, gallbladder, stomach, adrenal glands and kidneys are within normal limits. No acute osseous abnormalities are seen. Review of the MIP images confirms the above findings. IMPRESSION: 1. No evidence of pulmonary embolus. 2. Loculated complex left-sided pleural effusion has increased mildly in size, with minimal air now seen in the collection, raising concern for superinfection or evolving empyema. Would correlate clinically. 3. Dilatation of the main pulmonary artery, measuring 4.8 cm in diameter, raising question for underlying pulmonary arterial hypertension. 4. Scattered calcified pleural plaques raise question for underlying prior asbestos exposure. Mild scarring at the right lung base. 5. Mild emphysematous change at the upper lung lobes. Electronically Signed   By: Garald Balding M.D.   On: 09/26/2015 00:57     Assessment/Plan: Stable, waiting for ct directed aspiration today  No fever chill or other signs of infection at this point  [name] MD 09/27/2015 10:57 AM

## 2015-09-27 NOTE — Procedures (Signed)
L pleural mass Bx Core for culture and biopsy No comp/EBL

## 2015-09-27 NOTE — Progress Notes (Signed)
*   Echocardiogram 2D Echocardiogram has been performed.  [name] [name] 09/27/2015, 1:48 PM

## 2015-09-27 NOTE — Progress Notes (Signed)
PROGRESS NOTE  [name] [name] [id number] DOB: 1961-09-16 DOA: 09/25/2015 PCP: No PCP Per Patient  Brief History 54 year old male with a history of likely pleural effusion status post decortication in 2012 presented with increasing shortness of breath. The patient denies any other chronic medical problems. A discharge summary from 2012 when the patient had pneumonia states that he had "bronchoscopy with left thoracotomy and decortication per Dr. Servando Snare" of CT surgery in 2003. He had negative AFB times three in 2012.  The patient has been lost to follow-up since that period of time until he presented on 09/26/2015 with increasing shortness of breath. He denies any orthopnea or increasing lower extremity edema. He has a nonproductive cough without hemoptysis. He denies any fevers or chills. The patient works at a [employer], but denies any occupational exposure.  TCTS was consulted upon admission and helped set up CT guided drainage/biospy of left pleura. Assessment/Plan: Left pleural effusion -This appears to be chronic dating back to June 2012, but has increased in size -await cultures and pathology -09/27/2015 echo EF 55-50 percent, moderate RV dilatation, mild TR, PAP 33 -ANA -quantiferon gold Chest pain -Has typical and atypical components -EKG with nonspecific T-wave changes -cycle troponin -CTA chest--negative for embolus but shows loculated left pleural effusion with increasing size of calcified pleural plaques and dilated pulmonary artery Diarrhea -resolved Unintentional weight loss -lost approx 8 lbs in past month -TSH -HIV -viral hepatitis serologies   Family Communication:   Pt at beside Disposition Plan:   Home 1-2 days       Procedures/Studies: Dg Chest 2 View  09/25/2015  CLINICAL DATA:  54 year old male with chest pain EXAM: CHEST  2 VIEW COMPARISON:  Prior chest x-ray 11/10/2012 FINDINGS: Compared to prior imaging there has been slight interval  increase in the volume of the loculated left pleural effusion which now has a convex margin at the superior extent. There is associated atelectasis of the adjacent lung. Stable calcified pleural plaques in the right mid and lower thorax. Cardiac and mediastinal contours are unchanged. The main and central pulmonary arteries are markedly enlarged. No pneumothorax. No new focal airspace consolidation. Osseous structures are intact and unremarkable. IMPRESSION: Compared to 11/10/2012 the chronic loculated left pleural effusion has enlarged and developed more convex margins suggesting the possibility of superinfection or development of empyema. Otherwise, similar appearance of the chest with enlarged main and central pulmonary arteries suggesting pulmonary arterial hypertension and calcified pleural plaques bilaterally. Electronically Signed   By: Jacqulynn Cadet M.D.   On: 09/25/2015 20:08   Ct Angio Chest Pe W/cm &/or Wo Cm  09/26/2015  CLINICAL DATA:  Subacute onset of intermittent progressive shortness of breath, and dyspnea on exertion and rest. Midsternal chest burning. Initial encounter. EXAM: CT ANGIOGRAPHY CHEST WITH CONTRAST TECHNIQUE: Multidetector CT imaging of the chest was performed using the standard protocol during bolus administration of intravenous contrast. Multiplanar CT image reconstructions and MIPs were obtained to evaluate the vascular anatomy. CONTRAST:  65mL OMNIPAQUE IOHEXOL 350 MG/ML SOLN COMPARISON:  Chest radiograph performed 09/25/2015, and CT of the chest performed 04/17/2011 FINDINGS: There is no evidence of pulmonary embolus. The patient's loculated complex left-sided pleural effusion has increased mildly in size, with minimal air in the collection, raising concern for superinfection or evolving empyema. Scattered calcified pleural plaques raise question for underlying prior asbestos exposure. Mild scarring is again noted at the right lung base. Mild emphysematous change is noted  at the upper  lung lobes. There is no evidence of pneumothorax. No masses are identified; no abnormal focal contrast enhancement is seen. There is dilatation of the main pulmonary artery, measuring 4.8 cm in diameter, raising question for underlying pulmonary arterial hypertension. No pericardial effusion is identified. The great vessels are grossly unremarkable. No mediastinal lymphadenopathy is seen. No axillary lymphadenopathy is seen. The visualized portions of the thyroid gland are unremarkable in appearance. There is reflux of contrast into the hepatic veins and IVC. The visualized portions of the liver and spleen are unremarkable. The visualized portions of the pancreas, gallbladder, stomach, adrenal glands and kidneys are within normal limits. No acute osseous abnormalities are seen. Review of the MIP images confirms the above findings. IMPRESSION: 1. No evidence of pulmonary embolus. 2. Loculated complex left-sided pleural effusion has increased mildly in size, with minimal air now seen in the collection, raising concern for superinfection or evolving empyema. Would correlate clinically. 3. Dilatation of the main pulmonary artery, measuring 4.8 cm in diameter, raising question for underlying pulmonary arterial hypertension. 4. Scattered calcified pleural plaques raise question for underlying prior asbestos exposure. Mild scarring at the right lung base. 5. Mild emphysematous change at the upper lung lobes. Electronically Signed   By: Garald Balding M.D.   On: 09/26/2015 00:57         Subjective: Patient is still having some dyspnea on exertion. States that his breathing is better. Complains of intermittent chest discomfort with rest and exertion. Denies any nausea, vomiting, diarrhea, vomiting, hematochezia, melena. Denies any hemoptysis, fevers, chills.  Objective: Filed Vitals:   09/27/15 1532 09/27/15 1536 09/27/15 1541 09/27/15 1546  BP: 128/72 122/74 131/70 108/65  Pulse: 76 87 91 84    Temp:      TempSrc:      Resp: 25 21 10 10   Height:      Weight:      SpO2: 100% 96% 96% 99%    Intake/Output Summary (Last 24 hours) at 09/27/15 1729 Last data filed at 09/27/15 1347  Gross per 24 hour  Intake    360 ml  Output    450 ml  Net    -90 ml   Weight change:  Exam:   General:  Pt is alert, follows commands appropriately, not in acute distress  HEENT: No icterus, No thrush, No neck mass, [place]/AT  Cardiovascular: RRR, S1/S2, no rubs, no gallops  Respiratory: Diminished breath sounds left base. Bibasilar crackles. No wheezing.  Abdomen: Soft/+BS, non tender, non distended, no guarding; no hepatosplenomegaly  Extremities: No edema, No lymphangitis, No petechiae, No rashes, no synovitis; clubbing without cyanosis  Data Reviewed: Basic Metabolic Panel:  Recent Labs Lab 09/25/15 2106  NA 138  K 4.0  CL 100*  CO2 33*  GLUCOSE 88  BUN 9  CREATININE 1.09  CALCIUM 8.5*   Liver Function Tests:  Recent Labs Lab 09/25/15 2106  AST 30  ALT 22  ALKPHOS 80  BILITOT 0.5  PROT 8.9*  ALBUMIN 2.7*    Recent Labs Lab 09/25/15 2106  LIPASE 37   No results for input(s): AMMONIA in the last 168 hours. CBC:  Recent Labs Lab 09/25/15 2106  WBC 5.0  HGB 15.5  HCT 45.6  MCV 87.0  PLT 235   Cardiac Enzymes: No results for input(s): CKTOTAL, CKMB, CKMBINDEX, TROPONINI in the last 168 hours. BNP: Invalid input(s): POCBNP CBG: No results for input(s): GLUCAP in the last 168 hours.  No results found for this or any previous visit (from  the past 240 hour(s)).   Scheduled Meds: . dicyclomine  10 mg Oral TID AC & HS  . enoxaparin (LOVENOX) injection  30 mg Subcutaneous Q24H  . fentaNYL      . lidocaine (PF)      . midazolam       Continuous Infusions:    Asah Lamay, DO  [facility] Pager [phone]  If 7PM-7AM, please contact night-coverage [email or web address] Password [facility] 09/27/2015, 5:29 PM

## 2015-09-28 DIAGNOSIS — R072 Precordial pain: Secondary | ICD-10-CM | POA: Diagnosis not present

## 2015-09-28 DIAGNOSIS — J948 Other specified pleural conditions: Secondary | ICD-10-CM | POA: Diagnosis not present

## 2015-09-28 DIAGNOSIS — R109 Unspecified abdominal pain: Secondary | ICD-10-CM | POA: Diagnosis not present

## 2015-09-28 DIAGNOSIS — J90 Pleural effusion, not elsewhere classified: Secondary | ICD-10-CM | POA: Diagnosis not present

## 2015-09-28 LAB — BASIC METABOLIC PANEL
Anion gap: 4 — ABNORMAL LOW (ref 5–15)
BUN: 12 mg/dL (ref 6–20)
CALCIUM: 8.3 mg/dL — AB (ref 8.9–10.3)
CO2: 33 mmol/L — AB (ref 22–32)
CREATININE: 1.16 mg/dL (ref 0.61–1.24)
Chloride: 96 mmol/L — ABNORMAL LOW (ref 101–111)
GLUCOSE: 105 mg/dL — AB (ref 65–99)
Potassium: 4.6 mmol/L (ref 3.5–5.1)
Sodium: 133 mmol/L — ABNORMAL LOW (ref 135–145)

## 2015-09-28 LAB — HEPATITIS B SURFACE ANTIGEN: HEP B S AG: NEGATIVE

## 2015-09-28 LAB — TROPONIN I

## 2015-09-28 LAB — HEPATITIS C ANTIBODY: HCV AB: 6 {s_co_ratio} — AB (ref 0.0–0.9)

## 2015-09-28 LAB — HIV ANTIBODY (ROUTINE TESTING W REFLEX): HIV Screen 4th Generation wRfx: NONREACTIVE

## 2015-09-28 MED ORDER — MORPHINE SULFATE (PF) 2 MG/ML IV SOLN
2.0000 mg | Freq: Once | INTRAVENOUS | Status: AC
  Administered 2015-09-28: 2 mg via INTRAVENOUS
  Filled 2015-09-28: qty 1

## 2015-09-28 MED ORDER — POTASSIUM CHLORIDE 2 MEQ/ML IV SOLN: INTRAVENOUS | Status: DC

## 2015-09-28 MED ORDER — SODIUM CHLORIDE 0.9 % IV SOLN
INTRAVENOUS | Status: AC
  Administered 2015-09-28: 18:00:00 via INTRAVENOUS
  Filled 2015-09-28: qty 1000

## 2015-09-28 NOTE — Progress Notes (Addendum)
PROGRESS NOTE  [name] [name] [id number] DOB: 18-Nov-1960 DOA: 09/25/2015 PCP: No PCP Per Patient   Brief History 54 year old male with a history of likely pleural effusion status post decortication in 2003 presented with 3 weeks of increasing shortness of breath. The patient denies any other chronic medical problems. A discharge summary from 2012 when the patient had pneumonia states that he had "bronchoscopy with left thoracotomy and decortication per Dr. Servando Snare" of CT surgery in 2003. He had negative AFB times three in 2012. The patient has been lost to follow-up since that period of time until he presented on 09/26/2015 with increasing shortness of breath. He denies any orthopnea or increasing lower extremity edema, but c/o dyspnea with ADLs and walking to bathroom. He has a nonproductive cough without hemoptysis. He denies any fevers or chills. The patient works at a [employer], but denies any occupational exposure. TCTS was consulted upon admission and helped set up CT guided drainage/biospy of left pleura. Assessment/Plan: Left pleural effusion/pleural plaques -This appears to be chronic dating back to 2003, but has increased in size -await cultures and pathology -09/27/15--pleural biopsy--path pending; dry tap -09/27/2015 echo EF 55-50 percent, moderate RV dilatation, mild TR, PAP 33 -ANA -quantiferon gold -appreciate Dr. Neoma Laming discussed with him Chest pain -Has typical and atypical components -EKG with nonspecific T-wave changes -cycle troponin--neg x 3 -CTA chest--negative for embolus but shows loculated left pleural effusion with increasing size of calcified pleural plaques and dilated pulmonary artery Diarrhea -resolved Abdominal pain -Improves with passing flatus and bowel movements -tolerating diet Unintentional weight loss -lost approx 8 lbs in past month -TSH -HIV--neg -viral hepatitis serologies Hepatitis C antibody positive -check  Hep C RNA -outpt follow up Hyponatremia -secondary to volume depletion -IVF  Family Communication: none at beside Disposition Plan: Home 09/29/15 if tissue culture negative and okay with Dr. Servando Snare   Procedures/Studies: Dg Chest 2 View  09/25/2015  CLINICAL DATA:  54 year old male with chest pain EXAM: CHEST  2 VIEW COMPARISON:  Prior chest x-ray 11/10/2012 FINDINGS: Compared to prior imaging there has been slight interval increase in the volume of the loculated left pleural effusion which now has a convex margin at the superior extent. There is associated atelectasis of the adjacent lung. Stable calcified pleural plaques in the right mid and lower thorax. Cardiac and mediastinal contours are unchanged. The main and central pulmonary arteries are markedly enlarged. No pneumothorax. No new focal airspace consolidation. Osseous structures are intact and unremarkable. IMPRESSION: Compared to 11/10/2012 the chronic loculated left pleural effusion has enlarged and developed more convex margins suggesting the possibility of superinfection or development of empyema. Otherwise, similar appearance of the chest with enlarged main and central pulmonary arteries suggesting pulmonary arterial hypertension and calcified pleural plaques bilaterally. Electronically Signed   By: Jacqulynn Cadet M.D.   On: 09/25/2015 20:08   Ct Angio Chest Pe W/cm &/or Wo Cm  09/26/2015  CLINICAL DATA:  Subacute onset of intermittent progressive shortness of breath, and dyspnea on exertion and rest. Midsternal chest burning. Initial encounter. EXAM: CT ANGIOGRAPHY CHEST WITH CONTRAST TECHNIQUE: Multidetector CT imaging of the chest was performed using the standard protocol during bolus administration of intravenous contrast. Multiplanar CT image reconstructions and MIPs were obtained to evaluate the vascular anatomy. CONTRAST:  43mL OMNIPAQUE IOHEXOL 350 MG/ML SOLN COMPARISON:  Chest radiograph performed 09/25/2015, and CT of  the chest performed 04/17/2011 FINDINGS: There is no evidence of pulmonary embolus. The patient's  loculated complex left-sided pleural effusion has increased mildly in size, with minimal air in the collection, raising concern for superinfection or evolving empyema. Scattered calcified pleural plaques raise question for underlying prior asbestos exposure. Mild scarring is again noted at the right lung base. Mild emphysematous change is noted at the upper lung lobes. There is no evidence of pneumothorax. No masses are identified; no abnormal focal contrast enhancement is seen. There is dilatation of the main pulmonary artery, measuring 4.8 cm in diameter, raising question for underlying pulmonary arterial hypertension. No pericardial effusion is identified. The great vessels are grossly unremarkable. No mediastinal lymphadenopathy is seen. No axillary lymphadenopathy is seen. The visualized portions of the thyroid gland are unremarkable in appearance. There is reflux of contrast into the hepatic veins and IVC. The visualized portions of the liver and spleen are unremarkable. The visualized portions of the pancreas, gallbladder, stomach, adrenal glands and kidneys are within normal limits. No acute osseous abnormalities are seen. Review of the MIP images confirms the above findings. IMPRESSION: 1. No evidence of pulmonary embolus. 2. Loculated complex left-sided pleural effusion has increased mildly in size, with minimal air now seen in the collection, raising concern for superinfection or evolving empyema. Would correlate clinically. 3. Dilatation of the main pulmonary artery, measuring 4.8 cm in diameter, raising question for underlying pulmonary arterial hypertension. 4. Scattered calcified pleural plaques raise question for underlying prior asbestos exposure. Mild scarring at the right lung base. 5. Mild emphysematous change at the upper lung lobes. Electronically Signed   By: Garald Balding M.D.   On: 09/26/2015  00:57         Subjective: Patient states that he is breathing better today but still has some dyspnea with exertion. Denies any fevers, chills, chest pain, nausea, vomiting, diarrhea, hematochezia, melena.  Objective: Filed Vitals:   09/27/15 1546 09/27/15 2206 09/28/15 0628 09/28/15 1339  BP: 108/65 120/70 101/72 124/86  Pulse: 84 70 85 73  Temp:  97.8 F (36.6 C) 98.5 F (36.9 C) 98.3 F (36.8 C)  TempSrc:  Oral Oral Oral  Resp: 10 20 18 20   Height:      Weight:      SpO2: 99% 100% 93% 100%    Intake/Output Summary (Last 24 hours) at 09/28/15 1539 Last data filed at 09/28/15 1300  Gross per 24 hour  Intake   1230 ml  Output      0 ml  Net   1230 ml   Weight change:  Exam:   General:  Pt is alert, follows commands appropriately, not in acute distress  HEENT: No icterus, No thrush, No neck mass, [place]/AT  Cardiovascular: RRR, S1/S2, no rubs, no gallops  Respiratory: CTA bilaterally, no wheezing, no crackles, no rhonchi  Abdomen: Soft/+BS, non tender, non distended, no guarding; no hepatosplenomegaly  Extremities: No edema, No lymphangitis, No petechiae, No rashes, no synovitis; clubbing without cyanosis  Data Reviewed: Basic Metabolic Panel:  Recent Labs Lab 09/25/15 2106 09/28/15 0556  NA 138 133*  K 4.0 4.6  CL 100* 96*  CO2 33* 33*  GLUCOSE 88 105*  BUN 9 12  CREATININE 1.09 1.16  CALCIUM 8.5* 8.3*   Liver Function Tests:  Recent Labs Lab 09/25/15 2106  AST 30  ALT 22  ALKPHOS 80  BILITOT 0.5  PROT 8.9*  ALBUMIN 2.7*    Recent Labs Lab 09/25/15 2106  LIPASE 37   No results for input(s): AMMONIA in the last 168 hours. CBC:  Recent Labs Lab 09/25/15  2106  WBC 5.0  HGB 15.5  HCT 45.6  MCV 87.0  PLT 235   Cardiac Enzymes:  Recent Labs Lab 09/27/15 1830 09/27/15 2342 09/28/15 0556  TROPONINI <0.03 <0.03 <0.03   BNP: Invalid input(s): POCBNP CBG: No results for input(s): GLUCAP in the last 168 hours.  No results  found for this or any previous visit (from the past 240 hour(s)).   Scheduled Meds: . dicyclomine  10 mg Oral TID AC & HS  . enoxaparin (LOVENOX) injection  30 mg Subcutaneous Q24H   Continuous Infusions:    Aryam Zhan, DO  [facility] Pager [phone]  If 7PM-7AM, please contact night-coverage [email or web address] Password [facility] 09/28/2015, 3:39 PM

## 2015-09-28 NOTE — Discharge Summary (Addendum)
Physician Discharge Summary  [name] [name] [id number] DOB: 10-05-1961 DOA: 09/25/2015  PCP: London Pepper, MD  Admit date: 09/25/2015 Discharge date: 09/30/15 Recommendations for Outpatient Follow-up:  1. Pt will need to follow up with PCP in 1-2 weeks post discharge 2. Please obtain BMP in one week 3. Please follow up on results of the HCV RNA--if positive, the patient will likely need referral to GI 4. Please follow up on results of pleural biopsy and Quantiferon Gold 5. His follow-up results of pleural biopsy pathology. Discharge Diagnoses:  Left pleural effusion/pleural plaques -This appears to be chronic dating back to 2003, but has increased in size -await cultures and pathology -09/27/15--pleural biopsy--path pending; dry tap--CULTURE NEGATIVE -Pathology from the biopsy revealed only fibrinous material -09/27/2015 echo EF 55-50 percent, moderate RV dilatation, mild TR, PAP 33 -ANA--neg -quantiferon gold--pending at time of d/c -pulmonary consulted -appreciate Dr. Neoma Laming discussed with him -If core biopsy from 09/27/2015 shows mesothelioma -> refer medical oncology  -if core biopsy from 09/27/2015 is nondiagnostic or suggestive of infection [significant neutrophilia]  -> he has to be considered for VATS plus minus biopsy/decortication..  -appreciate TCTS, Dr. Loleta Books will need follow up appt with him -09/30/2015--the case was discussed with thoracic surgery, Dr. Salvatore Decent planned for 10/02/15-- the patient was okay to be discharged as long as preoperative labs were performed prior to discharge including PTT, INR, Type and Screen, CMP, CBC -Instructions were provided to the patient to return to short stay on Monday, 10/02/2015 at 5:45 AM--he expressed understanding Chest pain/Dyspnea -Has typical and atypical components -EKG with nonspecific T-wave changes -cycle troponin--neg x 3 -CTA chest--negative for embolus but shows loculated  left pleural effusion with increasing size of calcified pleural plaques and dilated pulmonary artery -Ambulatory pulse oximetry was performed prior to discharge, and it did not reveal any oxygen desaturation Abdominal pain -Resolved -Improves with passing flatus and bowel movements -tolerating diet -Suspect a component of irritable bowel syndrome as the patient also improved with dicyclomine Diarrhea -resolved  Unintentional weight loss -lost approx 8 lbs in past month -TSH -HIV--neg -viral hepatitis serologies Hepatitis C antibody positive -check Hep C RNA -outpt follow up Hyponatremia -secondary to volume depletion -IVF-->improved  Discharge Condition: stable  Disposition: home     Follow-up Information    Follow up with London Pepper, MD On 10/03/2015.   Specialty:  Family Medicine   Why:  10:30 am for hospital follow up   Contact information:   [address] [place] [address] [phone]       Follow up with [facility].   Why:  HHRN for drain care if needed   Contact information:   [address] [place] [address] [phone]       Follow up with [name], MD In 2 weeks.   Specialty:  Cardiothoracic Surgery   Contact information:   [address] [place] [address] [phone]       Diet:regular Wt Readings from Last 3 Encounters:  09/26/15 42.23 kg (93 lb 1.6 oz)    History of present illness:  54 year old male with a history of likely pleural effusion status post decortication in 2003 presented with 3 weeks of increasing shortness of breath. The patient denies any other chronic medical problems. A discharge summary from 2012 when the patient had pneumonia states that he had "bronchoscopy with left thoracotomy and decortication per Dr. Servando Snare" of CT surgery in 2003. He had negative AFB times three  in 2012. The patient has been lost to follow-up since that period of  time until he presented on 09/26/2015 with increasing shortness of breath. He denies any orthopnea or increasing lower extremity edema, but c/o dyspnea with ADLs and walking to bathroom. He has a nonproductive cough without hemoptysis. He denies any fevers or chills. The patient works at a [employer], but denies any occupational exposure. TCTS was consulted upon admission and helped set up CT guided drainage/biospy of left pleura.  Thoracocentesis was attempted, but it was a dry tap. The cultures from the pleural biopsy were unrevealing. Pathology was pending at time of discharge. Pulmonary medicine was consulted. The patient was seen by both Dr. Chase Caller. He felt that if the core biopsy is unrevealing or suggested infectious process, the patient may need to undergo VATS.  Consultants: Pulmonary--[facility] Thoracic Surgery--Dr. Servando Snare  Discharge Exam: Filed Vitals:   09/28/15 0628 09/28/15 1339  BP: 101/72 124/86  Pulse: 85 73  Temp: 98.5 F (36.9 C) 98.3 F (36.8 C)  Resp: 18 20   Filed Vitals:   09/27/15 1546 09/27/15 2206 09/28/15 0628 09/28/15 1339  BP: 108/65 120/70 101/72 124/86  Pulse: 84 70 85 73  Temp:  97.8 F (36.6 C) 98.5 F (36.9 C) 98.3 F (36.8 C)  TempSrc:  Oral Oral Oral  Resp: 10 20 18 20   Height:      Weight:      SpO2: 99% 100% 93% 100%   General: A&O x 3, NAD, pleasant, cooperative Cardiovascular: RRR, no rub, no gallop, no S3 Respiratory: Bibasilar crackles with diminished breath sounds on left base. No wheeze Abdomen:soft, nontender, nondistended, positive bowel sounds Extremities: No edema, No lymphangitis, no petechiae  Discharge Instructions     Medication List    Notice    You have not been prescribed any medications.       The results of significant diagnostics from this hospitalization (including imaging, microbiology, ancillary and laboratory) are listed below for reference.    Significant Diagnostic Studies: Dg Chest 2  View  09/25/2015  CLINICAL DATA:  54 year old male with chest pain EXAM: CHEST  2 VIEW COMPARISON:  Prior chest x-ray 11/10/2012 FINDINGS: Compared to prior imaging there has been slight interval increase in the volume of the loculated left pleural effusion which now has a convex margin at the superior extent. There is associated atelectasis of the adjacent lung. Stable calcified pleural plaques in the right mid and lower thorax. Cardiac and mediastinal contours are unchanged. The main and central pulmonary arteries are markedly enlarged. No pneumothorax. No new focal airspace consolidation. Osseous structures are intact and unremarkable. IMPRESSION: Compared to 11/10/2012 the chronic loculated left pleural effusion has enlarged and developed more convex margins suggesting the possibility of superinfection or development of empyema. Otherwise, similar appearance of the chest with enlarged main and central pulmonary arteries suggesting pulmonary arterial hypertension and calcified pleural plaques bilaterally. Electronically Signed   By: Jacqulynn Cadet M.D.   On: 09/25/2015 20:08   Ct Angio Chest Pe W/cm &/or Wo Cm  09/26/2015  CLINICAL DATA:  Subacute onset of intermittent progressive shortness of breath, and dyspnea on exertion and rest. Midsternal chest burning. Initial encounter. EXAM: CT ANGIOGRAPHY CHEST WITH CONTRAST TECHNIQUE: Multidetector CT imaging of the chest was performed using the standard protocol during bolus administration of intravenous contrast. Multiplanar CT image reconstructions and MIPs were obtained to evaluate the vascular anatomy. CONTRAST:  73mL OMNIPAQUE IOHEXOL 350 MG/ML SOLN COMPARISON:  Chest radiograph performed 09/25/2015, and  CT of the chest performed 04/17/2011 FINDINGS: There is no evidence of pulmonary embolus. The patient's loculated complex left-sided pleural effusion has increased mildly in size, with minimal air in the collection, raising concern for superinfection or  evolving empyema. Scattered calcified pleural plaques raise question for underlying prior asbestos exposure. Mild scarring is again noted at the right lung base. Mild emphysematous change is noted at the upper lung lobes. There is no evidence of pneumothorax. No masses are identified; no abnormal focal contrast enhancement is seen. There is dilatation of the main pulmonary artery, measuring 4.8 cm in diameter, raising question for underlying pulmonary arterial hypertension. No pericardial effusion is identified. The great vessels are grossly unremarkable. No mediastinal lymphadenopathy is seen. No axillary lymphadenopathy is seen. The visualized portions of the thyroid gland are unremarkable in appearance. There is reflux of contrast into the hepatic veins and IVC. The visualized portions of the liver and spleen are unremarkable. The visualized portions of the pancreas, gallbladder, stomach, adrenal glands and kidneys are within normal limits. No acute osseous abnormalities are seen. Review of the MIP images confirms the above findings. IMPRESSION: 1. No evidence of pulmonary embolus. 2. Loculated complex left-sided pleural effusion has increased mildly in size, with minimal air now seen in the collection, raising concern for superinfection or evolving empyema. Would correlate clinically. 3. Dilatation of the main pulmonary artery, measuring 4.8 cm in diameter, raising question for underlying pulmonary arterial hypertension. 4. Scattered calcified pleural plaques raise question for underlying prior asbestos exposure. Mild scarring at the right lung base. 5. Mild emphysematous change at the upper lung lobes. Electronically Signed   By: Garald Balding M.D.   On: 09/26/2015 00:57     Microbiology: No results found for this or any previous visit (from the past 240 hour(s)).   Labs: Basic Metabolic Panel:  Recent Labs Lab 09/25/15 2106 09/28/15 0556  NA 138 133*  K 4.0 4.6  CL 100* 96*  CO2 33* 33*    GLUCOSE 88 105*  BUN 9 12  CREATININE 1.09 1.16  CALCIUM 8.5* 8.3*   Liver Function Tests:  Recent Labs Lab 09/25/15 2106  AST 30  ALT 22  ALKPHOS 80  BILITOT 0.5  PROT 8.9*  ALBUMIN 2.7*    Recent Labs Lab 09/25/15 2106  LIPASE 37   No results for input(s): AMMONIA in the last 168 hours. CBC:  Recent Labs Lab 09/25/15 2106  WBC 5.0  HGB 15.5  HCT 45.6  MCV 87.0  PLT 235   Cardiac Enzymes:  Recent Labs Lab 09/27/15 1830 09/27/15 2342 09/28/15 0556  TROPONINI <0.03 <0.03 <0.03   BNP: Invalid input(s): POCBNP CBG: No results for input(s): GLUCAP in the last 168 hours.  Time coordinating discharge:  Greater than 30 minutes  Signed:  Anasophia Pecor, DO [facility] Pager: [phone] 09/28/2015, 5:34 PM

## 2015-09-28 NOTE — Consult Note (Signed)
PULMONARY / CRITICAL CARE MEDICINE   Name: [name] [name] MRN: [id number] DOB: 06/26/1961    ADMISSION DATE:  09/25/2015 CONSULTATION DATE:  09/28/15  REFERRING MD :  Dr [facility]on [facility]  CHIEF COMPLAINT:  Dyspnea, loculated lef effuon  HISTORY OF PRESENT ILLNESS:   History is obtained from the review of chart and also from the hospitalist Dr. Shanon Brow Tat  54 year old [place] male who speaks English well.  He was a former in [place] having immigrated here in the 1980s. As best as we know from our records he's had a chronic large loculated left pleural effusion. According to Dr. Shanon Brow Tat over 10 years ago though some evaluation within our health system [these records are not available to [place] anymore] and he was then placed on expectant follow-up. Apparently tuberculosis was not under consideration. He has many chest x-rays and CT scans but we only have data from approximately 2006 that shows a chronic large 17 cm left-sided loculated pleural effusion [apparently unchanged since 2003].  He now returns with insidious onset and progressive worsening of dyspnea in the last 3 weeks. It is moderate in severity. Brought on by exertion such as changing clothes or went to the toilet or walking up an incline and relieved by rest. No associated fever or chills or sputum production or hemoptysis or edema or orthopnea paroxysmal nocturnal dyspnea or chest pain. CT angiogram he has ruled out pulmonary embolism. However his loculated left-sided effusion has increased in size compared to most recent date of 2014 [2006 through 2014 appears stable between chest x-ray and CT scan upon my personal visualization]. He was set up for a scheduled CT-guided aspiration but this was not possible and instead he ended up with a left pleural mass biopsy with core biopsy for culture. Thoracic surgeries on consultation. Pulmonary medicine has been consulted now for 09/28/2015  He denies history of asbestos exposure or   tuberculosis  On room air is pulse ox was 94% with desaturation across the entire length of the hallway and back his lowest pulse ox was 91%. Off note, I personally visualized all the films.    PAST MEDICAL HISTORY :  He  has no past medical history on file.  PAST SURGICAL HISTORY: He  has past surgical history that includes Kidney surgery.  No Known Allergies  No current facility-administered medications on file prior to encounter.   No current outpatient prescriptions on file prior to encounter.    FAMILY HISTORY:  His has no family status information on file.   SOCIAL HISTORY: He  reports that he has never smoked. He does not have any smokeless tobacco history on file. He reports that he drinks alcohol. He reports that he does not use illicit drugs.  REVIEW OF SYSTEMS:   According to history of present illness otherwise 11 point review system is negative  VITAL SIGNS: BP 101/72 mmHg  Pulse 85  Temp(Src) 98.5 F (36.9 C) (Oral)  Resp 18  Ht 5' (1.524 m)  Wt 42.23 kg (93 lb 1.6 oz)  BMI 18.18 kg/m2  SpO2 93%  INTAKE / OUTPUT: I/O last 3 completed shifts: In: 35 [P.O.:990] Out: 350 [Urine:350]  PHYSICAL EXAMINATION: Accident. LOOKS WELL. Neuro:   Alert and oriented 3. Speech is normal. Mardelle Matte, skilled 15. Chem ICU negative for delirium. Gait is normal. HEENT:   Supple neck. No neck nodes. No elevated JVP. CardiovascularRegular rate and rhythm. No murmurs Respiratory: Overall clear to auscultation except in the bases there are some  crackles particularly on the right. Diminished air entry on the left. Normal work of breathing Abdomen:  Soft. Nontender. No organomegaly MusculoskeletaNo cyanosis no clubbing no edema Skin intact exposed areas   LABS:  CBC  Recent Labs Lab 09/25/15 2106  WBC 5.0  HGB 15.5  HCT 45.6  PLT 235   Coag's  Recent Labs Lab 09/26/15 1437  APTT 32  INR 1.16   BMET  Recent Labs Lab 09/25/15 2106 09/28/15 0556  NA  138 133*  K 4.0 4.6  CL 100* 96*  CO2 33* 33*  BUN 9 12  CREATININE 1.09 1.16  GLUCOSE 88 105*   Electrolytes  Recent Labs Lab 09/25/15 2106 09/28/15 0556  CALCIUM 8.5* 8.3*   Sepsis Markers No results for input(s): LATICACIDVEN, PROCALCITON, O2SATVEN in the last 168 hours. ABG No results for input(s): PHART, PCO2ART, PO2ART in the last 168 hours. Liver Enzymes  Recent Labs Lab 09/25/15 2106  AST 30  ALT 22  ALKPHOS 80  BILITOT 0.5  ALBUMIN 2.7*   Cardiac Enzymes  Recent Labs Lab 09/27/15 1830 09/27/15 2342 09/28/15 0556  TROPONINI <0.03 <0.03 <0.03   Glucose No results for input(s): GLUCAP in the last 168 hours.  Imaging - Imaging data in history of present illness. Other findings include scattered pleural plaques bilaterally. left-sided chronic effusion shows split pleural sign    ASSESSMENT / PLAN:  PULMONARY A: #Background - [place] immigrant with chronic loculated large left pleural effusion 17 cm. Stable between 2003 and 2014 a stone history and imaging with unclear details of past medical workup. Relatively asymptomatic with this.   # current - Three-week worsening dyspnea associated with enlargement in the size of the loculation. Per report solid on CT-guided biopsy and status post core biopsy 09/27/2015   - Differential diagnosis here is mesothelioma or   superimposed empyema   - He is a relative good functional status and does not desaturate with exertion  P:   If core biopsy from 09/27/2015 shows mesothelioma  -> refer oncology  However if core biopsy from 09/27/2015 is nondiagnostic or suggestive of infection [significant neutrophilia]   -> he has to be considered for VATS plus minus biopsy/decortication.Marland Kitchen Appreciate Dr. Pia Mau following the patient and his input into this will be vital   check QuantiFERON gold  D/w Dr Tat   Update a patient who is in understanding   Dr. Brand Males, M.D., [facility].C.P Pulmonary and Critical  Care Medicine Staff Physician [facility] Pager: [phone], If no answer or between  15:00h - 7:00h: call [phone]  09/28/2015 11:49 AM

## 2015-09-28 NOTE — Progress Notes (Signed)
Patient complains of acute pain 7/10 over upper abdomen & requests for pain medication. Tylene Fantasia NP paged and notified.

## 2015-09-28 NOTE — Progress Notes (Signed)
Patient ID: [name] [name], male   DOB: 20-May-1961, 54 y.o.   MRN: [id number]      [address]       [place] [address]             [phone]                        Subjective: Feels ok this am, site of bx does noe bother him, mild crampy lower /periumbicial pain  Objective: Vital signs in last 24 hours: Patient Vitals for the past 24 hrs:  BP Temp Temp src Pulse Resp SpO2  09/28/15 0628 101/72 mmHg 98.5 F (36.9 C) Oral 85 18 93 %  09/27/15 2206 120/70 mmHg 97.8 F (36.6 C) Oral 70 20 100 %  09/27/15 1546 108/65 mmHg - - 84 10 99 %  09/27/15 1541 131/70 mmHg - - 91 10 96 %  09/27/15 1536 122/74 mmHg - - 87 (!) 21 96 %  09/27/15 1532 128/72 mmHg - - 76 (!) 25 100 %  09/27/15 1526 111/65 mmHg - - 72 15 99 %  09/27/15 1523 115/67 mmHg - - 67 15 99 %  09/27/15 1520 140/89 mmHg - - (!) 58 18 100 %  09/27/15 1506 133/65 mmHg - - 69 (!) 23 100 %  09/27/15 1304 113/79 mmHg 98.6 F (37 C) - 66 18 95 %    Filed Weights   09/25/15 2049 09/26/15 0444  Weight: 93 lb 2 oz (42.241 kg) 93 lb 1.6 oz (42.23 kg)    Hemodynamic parameters for last 24 hours:    Intake/Output from previous day: 11/23 0701 - 11/24 0700 In: 750 [P.O.:750] Out: 200 [Urine:200] Intake/Output this shift:    Scheduled Meds: . dicyclomine  10 mg Oral TID AC & HS  . enoxaparin (LOVENOX) injection  30 mg Subcutaneous Q24H   Continuous Infusions:  PRN Meds:.loperamide  General appearance: alert and cooperative Neurologic: intact Heart: regular rate and rhythm, S1, S2 normal, no murmur, click, rub or gallop Lungs: diminished breath sounds LLL Abdomen: soft, non-tender; bowel sounds normal; no masses,  no organomegaly Extremities: extremities normal, atraumatic, no cyanosis or edema and Homans sign is negative, no sign of DVT  Lab Results: CBC: Recent Labs  09/25/15 2106  WBC 5.0  HGB 15.5  HCT 45.6  PLT 235   BMET:  Recent Labs  09/25/15 2106 09/28/15 0556  NA 138 133*  K  4.0 4.6  CL 100* 96*  CO2 33* 33*  GLUCOSE 88 105*  BUN 9 12  CREATININE 1.09 1.16  CALCIUM 8.5* 8.3*    PT/INR:  Recent Labs  09/26/15 1437  LABPROT 15.0  INR 1.16   Radiology No results found.  MICRO: No results found for this or any previous visit (from the past 240 hour(s)).    Assessment/Plan: To clearify history patient did not have decortication or surgery in 2012 was in 2003  S/P   attempted ct directed needle drainage biopsy of left chest- no report of procedure in chart No micro reported No path yet Patient complains of mild lower crampy abdominal pain, work per primary care   [name] MD 09/28/2015 9:22 AM

## 2015-09-28 NOTE — Progress Notes (Addendum)
Checked oxygen saturations per Dr. Golden Pop request.    Patient Saturations on Room Air at Rest = 91%; HR 91  Patient Saturations on Room Air while Ambulating (walked about 40 feet) = 92%; HR 93  Pt denied any dizziness or lightheadedness while ambulating.

## 2015-09-29 DIAGNOSIS — J90 Pleural effusion, not elsewhere classified: Secondary | ICD-10-CM | POA: Diagnosis not present

## 2015-09-29 DIAGNOSIS — R634 Abnormal weight loss: Secondary | ICD-10-CM | POA: Diagnosis not present

## 2015-09-29 DIAGNOSIS — J948 Other specified pleural conditions: Secondary | ICD-10-CM | POA: Diagnosis not present

## 2015-09-29 LAB — BASIC METABOLIC PANEL
Anion gap: 4 — ABNORMAL LOW (ref 5–15)
BUN: 11 mg/dL (ref 6–20)
CALCIUM: 8.1 mg/dL — AB (ref 8.9–10.3)
CO2: 34 mmol/L — AB (ref 22–32)
CREATININE: 0.88 mg/dL (ref 0.61–1.24)
Chloride: 98 mmol/L — ABNORMAL LOW (ref 101–111)
GFR calc non Af Amer: >60 mL/min (ref >60)
Glucose, Bld: 89 mg/dL (ref 65–99)
Potassium: 4.1 mmol/L (ref 3.5–5.1)
SODIUM: 136 mmol/L (ref 135–145)

## 2015-09-29 LAB — ANTINUCLEAR ANTIBODIES, IFA: ANA Ab, IFA: NEGATIVE

## 2015-09-29 NOTE — Progress Notes (Addendum)
      [address]       [place] [address]             [phone]      Subjective:  No complaints, denies abdominal pain today.  Objective: Vital signs in last 24 hours: Temp:  [98 F (36.7 C)-99.3 F (37.4 C)] 98 F (36.7 C) (11/25 0515) Pulse Rate:  [73-82] 73 (11/25 0515) Cardiac Rhythm:  [-]  Resp:  [16-20] 16 (11/25 0515) BP: (97-124)/(61-86) 105/64 mmHg (11/25 0515) SpO2:  [94 %-100 %] 96 % (11/25 0515)  Intake/Output from previous day: 11/24 0701 - 11/25 0700 In: 555 [P.O.:480; I.V.:75] Out: -     General appearance: alert, cooperative and no distress Heart: regular rate and rhythm Lungs: diminished breath sounds LLL Abdomen: soft, non-tender; bowel sounds normal; no masses,  no organomegaly  Lab Results: No results for input(s): WBC, HGB, HCT, PLT in the last 72 hours. BMET:  Recent Labs  09/28/15 0556 09/29/15 0335  NA 133* 136  K 4.6 4.1  CL 96* 98*  CO2 33* 34*  GLUCOSE 105* 89  BUN 12 11  CREATININE 1.16 0.88  CALCIUM 8.3* 8.1*    PT/INR:  Recent Labs  09/26/15 1437  LABPROT 15.0  INR 1.16   ABG    Component Value Date/Time   TCO2 29 04/17/2011 1923   CBG (last 3)  No results for input(s): GLUCAP in the last 72 hours.  Assessment/Plan:  1. S/P  CT Guided needle biopsy of left chest- awaiting pathology, microbiology reports 2. Care per primary       BARRETT, [name] 09/29/2015  Biopsy is not revealing, may require decortication, but not likely to mprove from chronic nature of findinds, consider next week.  I have seen and examined [name] [name] and agree with the above assessment  and plan.  [name] MD [facility] [phone] Office [phone] 09/29/2015 4:58 PM

## 2015-09-29 NOTE — Progress Notes (Signed)
PULMONARY / CRITICAL CARE MEDICINE   Name: [name] [name] MRN: [id number] DOB: 12-10-1960    ADMISSION DATE:  09/25/2015 CONSULTATION DATE:  09/28/15  REFERRING MD :  Dr Shanon Brow Tat  CHIEF COMPLAINT:  Dyspnea, loculated left effuon  VITAL SIGNS: BP 105/64 mmHg  Pulse 73  Temp(Src) 98 F (36.7 C) (Oral)  Resp 16  Ht 5' (1.524 m)  Wt 42.23 kg (93 lb 1.6 oz)  BMI 18.18 kg/m2  SpO2 96% Room air INTAKE / OUTPUT: I/O last 3 completed shifts: In: [id number] [P.O.:960; I.V.:75] Out: -   PHYSICAL EXAMINATION: Gen: no distress Neuro:   Alert and oriented 3. Speech is normal. No focal def  HEENT:   Supple neck. No neck nodes. No elevated JVP. CardiovascularRegular rate and rhythm. No murmurs Respiratory: Overall clear to auscultation, no accessory muscle use, Diminished air entry on the left. Normal work of breathing Abdomen:  Soft. Nontender. No organomegaly MusculoskeletaNo cyanosis no clubbing no edema Skin intact exposed areas   LABS:  CBC  Recent Labs Lab 09/25/15 2106  WBC 5.0  HGB 15.5  HCT 45.6  PLT 235   Coag's  Recent Labs Lab 09/26/15 1437  APTT 32  INR 1.16   BMET  Recent Labs Lab 09/25/15 2106 09/28/15 0556 09/29/15 0335  NA 138 133* 136  K 4.0 4.6 4.1  CL 100* 96* 98*  CO2 33* 33* 34*  BUN 9 12 11   CREATININE 1.09 1.16 0.88  GLUCOSE 88 105* 89   Electrolytes  Recent Labs Lab 09/25/15 2106 09/28/15 0556 09/29/15 0335  CALCIUM 8.5* 8.3* 8.1*   Sepsis Markers No results for input(s): LATICACIDVEN, PROCALCITON, O2SATVEN in the last 168 hours. ABG No results for input(s): PHART, PCO2ART, PO2ART in the last 168 hours. Liver Enzymes  Recent Labs Lab 09/25/15 2106  AST 30  ALT 22  ALKPHOS 80  BILITOT 0.5  ALBUMIN 2.7*   Cardiac Enzymes  Recent Labs Lab 09/27/15 1830 09/27/15 2342 09/28/15 0556  TROPONINI <0.03 <0.03 <0.03   Glucose No results for input(s): GLUCAP in the last 168 hours.  Imaging - Imaging data in history of  present illness. Other findings include scattered pleural plaques bilaterally. left-sided chronic effusion shows split pleural sign    ASSESSMENT / PLAN:  [place] with chronic loculated large left pleural effusion 17 cm. Stable between 2003 and 2014 a stone history and imaging with unclear details of past medical workup. Relatively asymptomatic with this.  acute on chronic loculated pleural effusion CT-guided biopsy and status post core biopsy 09/27/2015  - Differential diagnosis here is mesothelioma or superimposed empyema  Plan:   If core biopsy from 09/27/2015 shows mesothelioma  -> refer oncology  However if core biopsy from 09/27/2015 is nondiagnostic or suggestive of infection [significant neutrophilia]   -> he has to be considered for VATS plus minus biopsy/decortication. We will be available as needed. We will defer to thoracics but looks like he could go home and f/u with thoracic surgery.   Erick Colace ACNP-BC [place] Pager # [phone] OR # [phone] if no answer     09/29/2015 11:13 AM

## 2015-09-29 NOTE — Progress Notes (Addendum)
PROGRESS NOTE  Diana Gershon [id number] DOB: [birth year] DOA: 09/25/2015 PCP: London Pepper, MD  Brief History 54 year old male with a history of likely pleural effusion status post decortication in 2003 presented with 3 weeks of increasing shortness of breath. The patient denies any other chronic medical problems. A discharge summary from 2012 when the patient had pneumonia states that he had "bronchoscopy with left thoracotomy and decortication per Dr. Servando Snare" of CT surgery in 2003. He had negative AFB times three in 2012. The patient has been lost to follow-up since that period of time until he presented on 09/26/2015 with increasing shortness of breath. He denies any orthopnea or increasing lower extremity edema, but c/o dyspnea with ADLs and walking to bathroom. He has a nonproductive cough without hemoptysis. He denies any fevers or chills. The patient works at a [employer], but denies any occupational exposure. TCTS was consulted upon admission and helped set up CT guided drainage/biospy of left pleura. Thoracocentesis was attempted, but it was a dry tap. The cultures from the pleural biopsy were unrevealing. Pathology was pending at time of discharge. Pulmonary medicine was consulted. The patient was seen by both Dr. Chase Caller. He felt that if the core biopsy is unrevealing or suggested infectious process, the patient may need to undergo VATS. Assessment/Plan: Left pleural effusion/pleural plaques -This appears to be chronic dating back to 2003, but has increased in size -await cultures and pathology -09/27/15--pleural biopsy--path pending; dry tap--CULTURE NEGATIVE -09/27/2015 echo EF 55-50 percent, moderate RV dilatation, mild TR, PAP 33 -ANA -quantiferon gold--pending at time of d/c -pulmonary consulted -appreciate Dr. Neoma Laming discussed with him -If core biopsy from 09/27/2015 shows mesothelioma -> refer medical oncology  -if core biopsy from  09/27/2015 is nondiagnostic or suggestive of infection [significant neutrophilia]  -> he has to be considered for VATS plus minus biopsy/decortication..  -appreciate TCTS, Dr. Loleta Books will need follow up appt with him Chest pain/Dyspnea -Has typical and atypical components -EKG with nonspecific T-wave changes -cycle troponin--neg x 3 -CTA chest--negative for embolus but shows loculated left pleural effusion with increasing size of calcified pleural plaques and dilated pulmonary artery -Ambulatory pulse oximetry was performed prior to discharge, and it did not reveal any oxygen desaturation Abdominal pain -Resolved -Improves with passing flatus and bowel movements -tolerating diet  Diarrhea -resolved  Unintentional weight loss -lost approx 8 lbs in past month -TSH -HIV--neg -viral hepatitis serologies Hepatitis C antibody positive -check Hep C RNA -outpt follow up Hyponatremia -secondary to volume depletion -IVF-->improved   Disposition Plan: Home 09/29/15 if tissue culture negative and okay with Dr. Servando Snare      Procedures/Studies: Dg Chest 2 View  09/25/2015  CLINICAL DATA:  54 year old male with chest pain EXAM: CHEST  2 VIEW COMPARISON:  Prior chest x-ray 11/10/2012 FINDINGS: Compared to prior imaging there has been slight interval increase in the volume of the loculated left pleural effusion which now has a convex margin at the superior extent. There is associated atelectasis of the adjacent lung. Stable calcified pleural plaques in the right mid and lower thorax. Cardiac and mediastinal contours are unchanged. The main and central pulmonary arteries are markedly enlarged. No pneumothorax. No new focal airspace consolidation. Osseous structures are intact and unremarkable. IMPRESSION: Compared to 11/10/2012 the chronic loculated left pleural effusion has enlarged and developed more convex margins suggesting the possibility of superinfection or  development of empyema. Otherwise, similar appearance of the chest with enlarged main and central pulmonary  arteries suggesting pulmonary arterial hypertension and calcified pleural plaques bilaterally. Electronically Signed   By: Jacqulynn Cadet M.D.   On: 09/25/2015 20:08   Ct Angio Chest Pe W/cm &/or Wo Cm  09/26/2015  CLINICAL DATA:  Subacute onset of intermittent progressive shortness of breath, and dyspnea on exertion and rest. Midsternal chest burning. Initial encounter. EXAM: CT ANGIOGRAPHY CHEST WITH CONTRAST TECHNIQUE: Multidetector CT imaging of the chest was performed using the standard protocol during bolus administration of intravenous contrast. Multiplanar CT image reconstructions and MIPs were obtained to evaluate the vascular anatomy. CONTRAST:  52mL OMNIPAQUE IOHEXOL 350 MG/ML SOLN COMPARISON:  Chest radiograph performed 09/25/2015, and CT of the chest performed 04/17/2011 FINDINGS: There is no evidence of pulmonary embolus. The patient's loculated complex left-sided pleural effusion has increased mildly in size, with minimal air in the collection, raising concern for superinfection or evolving empyema. Scattered calcified pleural plaques raise question for underlying prior asbestos exposure. Mild scarring is again noted at the right lung base. Mild emphysematous change is noted at the upper lung lobes. There is no evidence of pneumothorax. No masses are identified; no abnormal focal contrast enhancement is seen. There is dilatation of the main pulmonary artery, measuring 4.8 cm in diameter, raising question for underlying pulmonary arterial hypertension. No pericardial effusion is identified. The great vessels are grossly unremarkable. No mediastinal lymphadenopathy is seen. No axillary lymphadenopathy is seen. The visualized portions of the thyroid gland are unremarkable in appearance. There is reflux of contrast into the hepatic veins and IVC. The visualized portions of the liver and  spleen are unremarkable. The visualized portions of the pancreas, gallbladder, stomach, adrenal glands and kidneys are within normal limits. No acute osseous abnormalities are seen. Review of the MIP images confirms the above findings. IMPRESSION: 1. No evidence of pulmonary embolus. 2. Loculated complex left-sided pleural effusion has increased mildly in size, with minimal air now seen in the collection, raising concern for superinfection or evolving empyema. Would correlate clinically. 3. Dilatation of the main pulmonary artery, measuring 4.8 cm in diameter, raising question for underlying pulmonary arterial hypertension. 4. Scattered calcified pleural plaques raise question for underlying prior asbestos exposure. Mild scarring at the right lung base. 5. Mild emphysematous change at the upper lung lobes. Electronically Signed   By: Garald Balding M.D.   On: 09/26/2015 00:57         Subjective: Patient denies fevers, chills, headache, chest pain, dyspnea, nausea, vomiting, diarrhea, abdominal pain, dysuria, hematuria   Objective: Filed Vitals:   09/28/15 0628 09/28/15 1339 09/28/15 2158 09/29/15 0515  BP: 101/72 124/86 97/61 105/64  Pulse: 85 73 82 73  Temp: 98.5 F (36.9 C) 98.3 F (36.8 C) 99.3 F (37.4 C) 98 F (36.7 C)  TempSrc: Oral Oral Oral Oral  Resp: 18 20 16 16   Height:      Weight:      SpO2: 93% 100% 94% 96%    Intake/Output Summary (Last 24 hours) at 09/29/15 0930 Last data filed at 09/28/15 1825  Gross per 24 hour  Intake    315 ml  Output      0 ml  Net    315 ml   Weight change:  Exam:   General:  Pt is alert, follows commands appropriately, not in acute distress  HEENT: No icterus, No thrush, No neck mass, [place]/AT  Cardiovascular: RRR, S1/S2, no rubs, no gallops  Respiratory: Bibasilar rales with diminished breath sounds left base  Abdomen: Soft/+BS, non tender, non distended,  no guarding  Extremities: No edema, No lymphangitis, No petechiae, No  rashes, no synovitis  Data Reviewed: Basic Metabolic Panel:  Recent Labs Lab 09/25/15 2106 09/28/15 0556 09/29/15 0335  NA 138 133* 136  K 4.0 4.6 4.1  CL 100* 96* 98*  CO2 33* 33* 34*  GLUCOSE 88 105* 89  BUN 9 12 11   CREATININE 1.09 1.16 0.88  CALCIUM 8.5* 8.3* 8.1*   Liver Function Tests:  Recent Labs Lab 09/25/15 2106  AST 30  ALT 22  ALKPHOS 80  BILITOT 0.5  PROT 8.9*  ALBUMIN 2.7*    Recent Labs Lab 09/25/15 2106  LIPASE 37   No results for input(s): AMMONIA in the last 168 hours. CBC:  Recent Labs Lab 09/25/15 2106  WBC 5.0  HGB 15.5  HCT 45.6  MCV 87.0  PLT 235   Cardiac Enzymes:  Recent Labs Lab 09/27/15 1830 09/27/15 2342 09/28/15 0556  TROPONINI <0.03 <0.03 <0.03   BNP: Invalid input(s): POCBNP CBG: No results for input(s): GLUCAP in the last 168 hours.  Recent Results (from the past 240 hour(s))  Tissue culture     Status: None (Preliminary result)   Collection Time: 09/27/15  4:00 PM  Result Value Ref Range Status   Specimen Description TISSUE LEFT PLEURAL  Final   Special Requests NONE  Final   Gram Stain PENDING  Incomplete   Culture   Final    NO GROWTH 1 DAY Performed at [facility]    Report Status PENDING  Incomplete     Scheduled Meds: . dicyclomine  10 mg Oral TID AC & HS  . enoxaparin (LOVENOX) injection  30 mg Subcutaneous Q24H   Continuous Infusions:    Shaana Acocella, DO  [facility] Pager [phone]  If 7PM-7AM, please contact night-coverage [email or web address] Password [email or web address] 09/29/2015, 9:30 AM

## 2015-09-30 ENCOUNTER — Observation Stay (HOSPITAL_COMMUNITY): Payer: BLUE CROSS/BLUE SHIELD

## 2015-09-30 DIAGNOSIS — R634 Abnormal weight loss: Secondary | ICD-10-CM | POA: Diagnosis not present

## 2015-09-30 DIAGNOSIS — J948 Other specified pleural conditions: Secondary | ICD-10-CM | POA: Diagnosis not present

## 2015-09-30 DIAGNOSIS — J90 Pleural effusion, not elsewhere classified: Secondary | ICD-10-CM | POA: Diagnosis not present

## 2015-09-30 DIAGNOSIS — R079 Chest pain, unspecified: Secondary | ICD-10-CM | POA: Diagnosis not present

## 2015-09-30 LAB — CBC
HCT: 51.3 % (ref 39.0–52.0)
HEMOGLOBIN: 17.3 g/dL — AB (ref 13.0–17.0)
MCH: 29.3 pg (ref 26.0–34.0)
MCHC: 33.7 g/dL (ref 30.0–36.0)
MCV: 86.8 fL (ref 78.0–100.0)
PLATELETS: 217 10*3/uL (ref 150–400)
RBC: 5.91 MIL/uL — ABNORMAL HIGH (ref 4.22–5.81)
RDW: 13.4 % (ref 11.5–15.5)
WBC: 6.1 10*3/uL (ref 4.0–10.5)

## 2015-09-30 LAB — URINALYSIS, ROUTINE W REFLEX MICROSCOPIC
Bilirubin Urine: NEGATIVE
Glucose, UA: NEGATIVE mg/dL
Hgb urine dipstick: NEGATIVE
Ketones, ur: NEGATIVE mg/dL
Leukocytes, UA: NEGATIVE
Nitrite: NEGATIVE
Protein, ur: NEGATIVE mg/dL
Specific Gravity, Urine: 1.016 (ref 1.005–1.030)
pH: 5.5 (ref 5.0–8.0)

## 2015-09-30 LAB — HCV RNA QUANT RFLX ULTRA OR GENOTYP
HCV RNA Qnt(log copy/mL): UNDETERMINED log10 IU/mL
HEPATITIS C QUANTITATION: NOT DETECTED [IU]/mL

## 2015-09-30 LAB — COMPREHENSIVE METABOLIC PANEL
ALK PHOS: 95 U/L (ref 38–126)
ALT: 36 U/L (ref 17–63)
ANION GAP: 6 (ref 5–15)
AST: 55 U/L — ABNORMAL HIGH (ref 15–41)
Albumin: 3.6 g/dL (ref 3.5–5.0)
BUN: 12 mg/dL (ref 6–20)
CALCIUM: 9.4 mg/dL (ref 8.9–10.3)
CHLORIDE: 94 mmol/L — AB (ref 101–111)
CO2: 32 mmol/L (ref 22–32)
Creatinine, Ser: 1.17 mg/dL (ref 0.61–1.24)
GFR calc non Af Amer: >60 mL/min (ref >60)
Glucose, Bld: 91 mg/dL (ref 65–99)
Potassium: 4.3 mmol/L (ref 3.5–5.1)
SODIUM: 132 mmol/L — AB (ref 135–145)
Total Bilirubin: 0.3 mg/dL (ref 0.3–1.2)
Total Protein: 11.3 g/dL — ABNORMAL HIGH (ref 6.5–8.1)

## 2015-09-30 LAB — QUANTIFERON IN TUBE
QFT TB AG MINUS NIL VALUE: 0.05 IU/mL
QUANTIFERON MITOGEN VALUE: 5.55 IU/mL
QUANTIFERON TB AG VALUE: 0.13 IU/mL
QUANTIFERON TB GOLD: NEGATIVE
Quantiferon Nil Value: 0.08 IU/mL

## 2015-09-30 LAB — QUANTIFERON TB GOLD ASSAY (BLOOD)

## 2015-09-30 NOTE — Progress Notes (Signed)
Patient discharge teaching given, including activity, diet, follow-up appoints, and medications. Patient verbalized understanding of all discharge instructions. Patient instructed to come to short stay Monday morning at 5:45am and to leave his blood bank bracelet on. IV access was d/c'd. Vitals are stable. Skin is intact except as charted in most recent assessments. Pt to be escorted out by NT, to be driven home by family.

## 2015-09-30 NOTE — Progress Notes (Signed)
Instructions for surgery given to case manager. Give case manager a bottle of CHG to give to patient with instructions.

## 2015-09-30 NOTE — Pre-Procedure Instructions (Signed)
[name] [name]  09/30/2015     No Pharmacies Listed   Your procedure is scheduled on November 28  Report to [facility] Admitting at 5:30 A.M.  Call this number if you have problems the morning of surgery:  [phone]   Remember:  Do not eat food or drink liquids after midnight.  Take these medicines the morning of surgery with A SIP OF WATER None   STOP/ Do not take Aspirin, Aleve, Naproxen, Advil, Ibuprofen, Motrin, Vitamins, Herbs, or Supplements starting today   Do not wear jewelry, make-up or nail polish.  Do not wear lotions, powders, or perfumes.  You may wear deodorant.  Do not shave 48 hours prior to surgery.  Men may shave face and neck.  Do not bring valuables to the hospital.  [facility] is not responsible for any belongings or valuables.  Contacts, dentures or bridgework may not be worn into surgery.  Leave your suitcase in the car.  After surgery it may be brought to your room.  For patients admitted to the hospital, discharge time will be determined by your treatment team.  Patients discharged the day of surgery will not be allowed to drive home.    - Preparing for Surgery  Before surgery, you can play an important role.  Because skin is not sterile, your skin needs to be as free of germs as possible.  You can reduce the number of germs on you skin by washing with CHG (chlorahexidine gluconate) soap before surgery.  CHG is an antiseptic cleaner which kills germs and bonds with the skin to continue killing germs even after washing.  Please DO NOT use if you have an allergy to CHG or antibacterial soaps.  If your skin becomes reddened/irritated stop using the CHG and inform your nurse when you arrive at Short Stay.  Do not shave (including legs and underarms) for at least 48 hours prior to the first CHG shower.  You may shave your face.  Please follow these instructions carefully:   1.  Shower with CHG Soap the night before surgery and the  morning of Surgery.  2.  If you choose to wash your hair, wash your hair first as usual with your normal shampoo.  3.  After you shampoo, rinse your hair and body thoroughly to remove the shampoo.  4.  Use CHG as you would any other liquid soap.  You can apply CHG directly to the skin and wash gently with scrungie or a clean washcloth.  5.  Apply the CHG Soap to your body ONLY FROM THE NECK DOWN.  Do not use on open wounds or open sores.  Avoid contact with your eyes, ears, mouth and genitals (private parts).  Wash genitals (private parts) with your normal soap.  6.  Wash thoroughly, paying special attention to the area where your surgery will be performed.  7.  Thoroughly rinse your body with warm water from the neck down.  8.  DO NOT shower/wash with your normal soap after using and rinsing off the CHG Soap.  9.  Pat yourself dry with a clean towel.            10.  Wear clean pajamas.            11.  Place clean sheets on your bed the night of your first shower and do not sleep with pets.  Day of Surgery  Do not apply any lotions the morning of surgery.  Please wear clean clothes to the hospital/surgery center.   Please read over the following fact sheets that you were given. Pain Booklet, Coughing and Deep Breathing and Surgical Site Infection Prevention

## 2015-09-30 NOTE — Progress Notes (Addendum)
      [address]       [place] [address]             [phone]      Subjective:  No complaints.  Objective: Vital signs in last 24 hours: Temp:  [97.7 F (36.5 C)-98.3 F (36.8 C)] 98.2 F (36.8 C) (11/26 0524) Pulse Rate:  [71-81] 81 (11/26 0524) Cardiac Rhythm:  [-]  Resp:  [18-20] 18 (11/26 0524) BP: (127-167)/(84-91) 132/84 mmHg (11/26 0524) SpO2:  [95 %-97 %] 95 % (11/26 0524)  Intake/Output from previous day: 11/25 0701 - 11/26 0700 In: 63 [P.O.:820] Out: -   General appearance: alert, cooperative and no distress Heart: regular rate and rhythm Lungs: diminished breath sounds LLL Abdomen: soft, non-tender; bowel sounds normal; no masses,  no organomegaly  Lab Results: No results for input(s): WBC, HGB, HCT, PLT in the last 72 hours. BMET:  Recent Labs  09/28/15 0556 09/29/15 0335  NA 133* 136  K 4.6 4.1  CL 96* 98*  CO2 33* 34*  GLUCOSE 105* 89  BUN 12 11  CREATININE 1.16 0.88  CALCIUM 8.3* 8.1*    PT/INR: No results for input(s): LABPROT, INR in the last 72 hours. ABG    Component Value Date/Time   TCO2 29 04/17/2011 1923   CBG (last 3)  No results for input(s): GLUCAP in the last 72 hours.  Assessment/Plan:  1. Pathology- is un-diagnostic, will get PA/LAT CXR today- will review once completed to determine course of treatment.... No surgery vs. VATS      BARRETT, ERIN 09/30/2015  Patient seen and chest xray reviewed, will little information as to cause I have recommended to patient that we proceed with left VATS on Monday. Risks and options discussed with the patient. He is agreeable with proceeding.  I have seen and examined [name] and agree with the above assessment  and plan.  [name] MD Beeper [phone] Office [phone] 09/30/2015 10:59 AM

## 2015-09-30 NOTE — Care Management (Signed)
CM received call regarding patient needing pre-op arranged  [place] on Monday 11/28 7:15 am  at [place]. Informed patient that he would need to be at [facility] at 5:30 am, he is amendable Spoke with Robbie in [facility] regarding pre-op orders for labs. Robbie request that all labs be drawn prior to patient leaving hospital except the T&S which will be drawn in pre-op on day of surgery., CM discussed this with Adrea RN on 5 W. Pre-Op  instruction packets printed reviewed and given to patient. Also CHG bath given with detail written and verbal  instruction patient. Teach back performed patient verbalized understanding. Patient denies any further questions or concerns.  Updated Dr. Carles Collet. No further CM needs identified.

## 2015-10-01 LAB — TISSUE CULTURE
CULTURE: NO GROWTH
GRAM STAIN: NONE SEEN

## 2015-10-01 LAB — ABO/RH: ABO/RH(D): AB POS

## 2015-10-02 ENCOUNTER — Inpatient Hospital Stay (HOSPITAL_COMMUNITY): Payer: BLUE CROSS/BLUE SHIELD | Admitting: Certified Registered Nurse Anesthetist

## 2015-10-02 ENCOUNTER — Inpatient Hospital Stay (HOSPITAL_COMMUNITY): Payer: BLUE CROSS/BLUE SHIELD

## 2015-10-02 ENCOUNTER — Encounter (HOSPITAL_COMMUNITY)
Admission: AD | Disposition: A | Discharge status: Home Health | Payer: Self-pay | Source: Ambulatory Visit | Attending: Cardiothoracic Surgery

## 2015-10-02 ENCOUNTER — Encounter (HOSPITAL_COMMUNITY): Payer: Self-pay | Admitting: Certified Registered Nurse Anesthetist

## 2015-10-02 ENCOUNTER — Inpatient Hospital Stay (HOSPITAL_COMMUNITY)
Admission: AD | Admit: 2015-10-02 | Discharge: 2015-10-20 | DRG: 164 | Disposition: A | Discharge status: Home Health | Payer: BLUE CROSS/BLUE SHIELD | Source: Ambulatory Visit | Attending: Cardiothoracic Surgery | Admitting: Cardiothoracic Surgery

## 2015-10-02 DIAGNOSIS — Z77090 Contact with and (suspected) exposure to asbestos: Secondary | ICD-10-CM | POA: Diagnosis present

## 2015-10-02 DIAGNOSIS — J90 Pleural effusion, not elsewhere classified: Secondary | ICD-10-CM | POA: Diagnosis present

## 2015-10-02 DIAGNOSIS — I272 Other secondary pulmonary hypertension: Secondary | ICD-10-CM | POA: Diagnosis present

## 2015-10-02 DIAGNOSIS — R042 Hemoptysis: Secondary | ICD-10-CM | POA: Diagnosis not present

## 2015-10-02 DIAGNOSIS — Z9689 Presence of other specified functional implants: Secondary | ICD-10-CM | POA: Insufficient documentation

## 2015-10-02 DIAGNOSIS — N179 Acute kidney failure, unspecified: Secondary | ICD-10-CM | POA: Diagnosis not present

## 2015-10-02 DIAGNOSIS — Z4682 Encounter for fitting and adjustment of non-vascular catheter: Secondary | ICD-10-CM

## 2015-10-02 DIAGNOSIS — R197 Diarrhea, unspecified: Secondary | ICD-10-CM | POA: Diagnosis not present

## 2015-10-02 DIAGNOSIS — J948 Other specified pleural conditions: Secondary | ICD-10-CM | POA: Diagnosis present

## 2015-10-02 DIAGNOSIS — R509 Fever, unspecified: Secondary | ICD-10-CM | POA: Diagnosis not present

## 2015-10-02 DIAGNOSIS — J9811 Atelectasis: Secondary | ICD-10-CM | POA: Diagnosis present

## 2015-10-02 DIAGNOSIS — Z5332 Thoracoscopic surgical procedure converted to open procedure: Secondary | ICD-10-CM | POA: Diagnosis not present

## 2015-10-02 DIAGNOSIS — B963 Hemophilus influenzae [H. influenzae] as the cause of diseases classified elsewhere: Secondary | ICD-10-CM | POA: Diagnosis present

## 2015-10-02 DIAGNOSIS — J95812 Postprocedural air leak: Secondary | ICD-10-CM | POA: Diagnosis not present

## 2015-10-02 DIAGNOSIS — Z23 Encounter for immunization: Secondary | ICD-10-CM | POA: Diagnosis not present

## 2015-10-02 DIAGNOSIS — I10 Essential (primary) hypertension: Secondary | ICD-10-CM | POA: Diagnosis present

## 2015-10-02 DIAGNOSIS — J939 Pneumothorax, unspecified: Secondary | ICD-10-CM

## 2015-10-02 DIAGNOSIS — R0600 Dyspnea, unspecified: Secondary | ICD-10-CM | POA: Diagnosis present

## 2015-10-02 DIAGNOSIS — B1920 Unspecified viral hepatitis C without hepatic coma: Secondary | ICD-10-CM | POA: Diagnosis present

## 2015-10-02 DIAGNOSIS — Y838 Other surgical procedures as the cause of abnormal reaction of the patient, or of later complication, without mention of misadventure at the time of the procedure: Secondary | ICD-10-CM | POA: Diagnosis not present

## 2015-10-02 DIAGNOSIS — N178 Other acute kidney failure: Secondary | ICD-10-CM | POA: Diagnosis not present

## 2015-10-02 DIAGNOSIS — J869 Pyothorax without fistula: Secondary | ICD-10-CM | POA: Diagnosis present

## 2015-10-02 DIAGNOSIS — R5082 Postprocedural fever: Secondary | ICD-10-CM

## 2015-10-02 HISTORY — DX: Calculus of kidney: N20.0

## 2015-10-02 HISTORY — PX: VIDEO ASSISTED THORACOSCOPY (VATS)/DECORTICATION: SHX6171

## 2015-10-02 HISTORY — PX: VIDEO BRONCHOSCOPY: SHX5072

## 2015-10-02 HISTORY — DX: Essential (primary) hypertension: I10

## 2015-10-02 LAB — BODY FLUID CELL COUNT WITH DIFFERENTIAL
Eos, Fluid: UNDETERMINED %
Lymphs, Fluid: UNDETERMINED %
Monocyte-Macrophage-Serous Fluid: UNDETERMINED % (ref 50–90)
Neutrophil Count, Fluid: UNDETERMINED % (ref 0–25)
Total Nucleated Cell Count, Fluid: UNDETERMINED cu mm (ref 0–1000)

## 2015-10-02 LAB — GLUCOSE, CAPILLARY: Glucose-Capillary: 119 mg/dL — ABNORMAL HIGH (ref 65–99)

## 2015-10-02 LAB — QUANTIFERON IN TUBE
QFT TB AG MINUS NIL VALUE: 0.23 [IU]/mL
QUANTIFERON MITOGEN VALUE: 8.49 [IU]/mL
QUANTIFERON NIL VALUE: 0.05 [IU]/mL
QUANTIFERON TB AG VALUE: 0.28 [IU]/mL
QUANTIFERON TB GOLD: NEGATIVE

## 2015-10-02 LAB — LACTATE DEHYDROGENASE, PLEURAL OR PERITONEAL FLUID

## 2015-10-02 LAB — GLUCOSE, SEROUS FLUID

## 2015-10-02 LAB — GRAM STAIN

## 2015-10-02 LAB — PROTEIN, BODY FLUID

## 2015-10-02 LAB — TYPE AND SCREEN
ABO/RH(D): AB POS
ANTIBODY SCREEN: NEGATIVE

## 2015-10-02 LAB — SURGICAL PCR SCREEN
MRSA, PCR: NEGATIVE
Staphylococcus aureus: NEGATIVE

## 2015-10-02 LAB — QUANTIFERON TB GOLD ASSAY (BLOOD)

## 2015-10-02 SURGERY — VIDEO ASSISTED THORACOSCOPY (VATS)/DECORTICATION
Anesthesia: General | Site: Chest

## 2015-10-02 MED ORDER — PHENYLEPHRINE 40 MCG/ML (10ML) SYRINGE FOR IV PUSH (FOR BLOOD PRESSURE SUPPORT)
PREFILLED_SYRINGE | INTRAVENOUS | Status: AC
  Filled 2015-10-02: qty 10

## 2015-10-02 MED ORDER — MIDAZOLAM HCL 5 MG/5ML IJ SOLN
INTRAMUSCULAR | Status: DC | PRN
  Administered 2015-10-02: 2 mg via INTRAVENOUS

## 2015-10-02 MED ORDER — ONDANSETRON HCL 4 MG/2ML IJ SOLN
INTRAMUSCULAR | Status: AC
  Filled 2015-10-02: qty 2

## 2015-10-02 MED ORDER — PANTOPRAZOLE SODIUM 40 MG PO TBEC
40.0000 mg | DELAYED_RELEASE_TABLET | Freq: Every day | ORAL | Status: DC
  Administered 2015-10-03 – 2015-10-20 (×18): 40 mg via ORAL
  Filled 2015-10-02 (×18): qty 1

## 2015-10-02 MED ORDER — MIDAZOLAM HCL 2 MG/2ML IJ SOLN
INTRAMUSCULAR | Status: AC
  Filled 2015-10-02: qty 2

## 2015-10-02 MED ORDER — ONDANSETRON HCL 4 MG/2ML IJ SOLN
4.0000 mg | Freq: Four times a day (QID) | INTRAMUSCULAR | Status: DC | PRN
Start: 2015-10-02 — End: 2015-10-20

## 2015-10-02 MED ORDER — PHENYLEPHRINE HCL 10 MG/ML IJ SOLN
INTRAMUSCULAR | Status: DC | PRN
  Administered 2015-10-02: 80 ug via INTRAVENOUS

## 2015-10-02 MED ORDER — DEXAMETHASONE SODIUM PHOSPHATE 4 MG/ML IJ SOLN
INTRAMUSCULAR | Status: AC
  Filled 2015-10-02: qty 2

## 2015-10-02 MED ORDER — 0.9 % SODIUM CHLORIDE (POUR BTL) OPTIME
TOPICAL | Status: DC | PRN
  Administered 2015-10-02: 2000 mL

## 2015-10-02 MED ORDER — PHENYLEPHRINE HCL 10 MG/ML IJ SOLN
10.0000 mg | INTRAVENOUS | Status: DC | PRN
  Administered 2015-10-02: 20 ug/min via INTRAVENOUS

## 2015-10-02 MED ORDER — SODIUM CHLORIDE 0.9 % IR SOLN
Status: DC | PRN
  Administered 2015-10-02: 3000 mL

## 2015-10-02 MED ORDER — PNEUMOCOCCAL VAC POLYVALENT 25 MCG/0.5ML IJ INJ
0.5000 mL | INJECTION | INTRAMUSCULAR | Status: AC | PRN
  Administered 2015-10-20: 0.5 mL via INTRAMUSCULAR

## 2015-10-02 MED ORDER — POTASSIUM CHLORIDE 10 MEQ/50ML IV SOLN: 10.0000 meq | Freq: Every day | INTRAVENOUS | Status: DC | PRN

## 2015-10-02 MED ORDER — SENNOSIDES-DOCUSATE SODIUM 8.6-50 MG PO TABS
1.0000 | ORAL_TABLET | Freq: Every day | ORAL | Status: DC
Start: 2015-10-02 — End: 2015-10-10
  Administered 2015-10-02 – 2015-10-08 (×7): 1 via ORAL
  Filled 2015-10-02 (×7): qty 1

## 2015-10-02 MED ORDER — SODIUM CHLORIDE 0.9 % IJ SOLN: 9.0000 mL | INTRAMUSCULAR | Status: DC | PRN

## 2015-10-02 MED ORDER — DEXAMETHASONE SODIUM PHOSPHATE 4 MG/ML IJ SOLN
INTRAMUSCULAR | Status: DC | PRN
  Administered 2015-10-02: 8 mg via INTRAVENOUS

## 2015-10-02 MED ORDER — ACETAMINOPHEN 500 MG PO TABS
1000.0000 mg | ORAL_TABLET | Freq: Four times a day (QID) | ORAL | Status: AC
  Administered 2015-10-02 – 2015-10-07 (×15): 1000 mg via ORAL
  Filled 2015-10-02 (×16): qty 2

## 2015-10-02 MED ORDER — ONDANSETRON HCL 4 MG/2ML IJ SOLN
4.0000 mg | Freq: Four times a day (QID) | INTRAMUSCULAR | Status: DC | PRN
  Filled 2015-10-02: qty 2

## 2015-10-02 MED ORDER — KCL IN DEXTROSE-NACL 20-5-0.9 MEQ/L-%-% IV SOLN
INTRAVENOUS | Status: DC
  Administered 2015-10-02 – 2015-10-09 (×2): via INTRAVENOUS
  Filled 2015-10-02 (×11): qty 1000

## 2015-10-02 MED ORDER — NALOXONE HCL 0.4 MG/ML IJ SOLN
0.4000 mg | INTRAMUSCULAR | Status: DC | PRN
  Filled 2015-10-02: qty 1

## 2015-10-02 MED ORDER — BISACODYL 5 MG PO TBEC
10.0000 mg | DELAYED_RELEASE_TABLET | Freq: Every day | ORAL | Status: DC
  Administered 2015-10-03 – 2015-10-09 (×7): 10 mg via ORAL
  Filled 2015-10-02 (×8): qty 2

## 2015-10-02 MED ORDER — LACTATED RINGERS IV SOLN
INTRAVENOUS | Status: DC | PRN
  Administered 2015-10-02: 07:00:00 via INTRAVENOUS

## 2015-10-02 MED ORDER — ROCURONIUM BROMIDE 50 MG/5ML IV SOLN
INTRAVENOUS | Status: AC
  Filled 2015-10-02: qty 2

## 2015-10-02 MED ORDER — PROPOFOL 10 MG/ML IV BOLUS
INTRAVENOUS | Status: DC | PRN
  Administered 2015-10-02: 100 mg via INTRAVENOUS

## 2015-10-02 MED ORDER — DIPHENHYDRAMINE HCL 50 MG/ML IJ SOLN
12.5000 mg | Freq: Four times a day (QID) | INTRAMUSCULAR | Status: DC | PRN
  Filled 2015-10-02: qty 0.25

## 2015-10-02 MED ORDER — INSULIN ASPART 100 UNIT/ML ~~LOC~~ SOLN: 0.0000 [IU] | Freq: Three times a day (TID) | SUBCUTANEOUS | Status: DC

## 2015-10-02 MED ORDER — SUCCINYLCHOLINE CHLORIDE 20 MG/ML IJ SOLN
INTRAMUSCULAR | Status: AC
Start: 2015-10-02 — End: 2015-10-02
  Filled 2015-10-02: qty 1

## 2015-10-02 MED ORDER — FENTANYL 40 MCG/ML IV SOLN
INTRAVENOUS | Status: DC
Start: 2015-10-02 — End: 2015-10-09
  Administered 2015-10-02: 12:00:00 via INTRAVENOUS
  Administered 2015-10-02: 90 ug via INTRAVENOUS
  Administered 2015-10-03: 150 ug via INTRAVENOUS
  Administered 2015-10-03: 90 ug via INTRAVENOUS
  Administered 2015-10-03: 30 ug via INTRAVENOUS
  Administered 2015-10-03: 70 ug via INTRAVENOUS
  Administered 2015-10-03: 150 ug via INTRAVENOUS
  Administered 2015-10-03: 20 ug via INTRAVENOUS
  Administered 2015-10-04: 110 ug via INTRAVENOUS
  Administered 2015-10-04 (×2): 70 ug via INTRAVENOUS
  Administered 2015-10-04: 120 ug via INTRAVENOUS
  Administered 2015-10-04: 90 ug via INTRAVENOUS
  Administered 2015-10-04: 160 ug via INTRAVENOUS
  Administered 2015-10-05: 110 ug via INTRAVENOUS
  Administered 2015-10-05: 90 ug via INTRAVENOUS
  Administered 2015-10-05: 120 ug via INTRAVENOUS
  Administered 2015-10-05: 180 ug via INTRAVENOUS
  Administered 2015-10-05: 12:00:00 via INTRAVENOUS
  Administered 2015-10-05: 200 ug via INTRAVENOUS
  Administered 2015-10-06: 160 ug via INTRAVENOUS
  Administered 2015-10-06: 80 ug via INTRAVENOUS
  Administered 2015-10-06: 120 ug via INTRAVENOUS
  Administered 2015-10-06: 150 ug via INTRAVENOUS
  Administered 2015-10-06: 120 ug via INTRAVENOUS
  Administered 2015-10-06: 30 ug via INTRAVENOUS
  Administered 2015-10-07: 40 ug via INTRAVENOUS
  Administered 2015-10-07: 30 ug via INTRAVENOUS
  Administered 2015-10-07: 05:00:00 via INTRAVENOUS
  Administered 2015-10-07: 50 ug via INTRAVENOUS
  Administered 2015-10-08: 40 ug via INTRAVENOUS
  Administered 2015-10-08: 10 ug via INTRAVENOUS
  Administered 2015-10-08: 20 ug via INTRAVENOUS
  Administered 2015-10-08: 30 ug via INTRAVENOUS
  Administered 2015-10-08 – 2015-10-09 (×3): 40 ug via INTRAVENOUS
  Administered 2015-10-09: 20 ug via INTRAVENOUS
  Filled 2015-10-02 (×4): qty 25

## 2015-10-02 MED ORDER — MUPIROCIN 2 % EX OINT
1.0000 "application " | TOPICAL_OINTMENT | Freq: Once | CUTANEOUS | Status: AC
  Administered 2015-10-02: 1 via TOPICAL
  Filled 2015-10-02: qty 22

## 2015-10-02 MED ORDER — DIPHENHYDRAMINE HCL 12.5 MG/5ML PO ELIX
12.5000 mg | ORAL_SOLUTION | Freq: Four times a day (QID) | ORAL | Status: DC | PRN
  Administered 2015-10-05: 12.5 mg via ORAL
  Filled 2015-10-02 (×2): qty 5

## 2015-10-02 MED ORDER — LIDOCAINE HCL (CARDIAC) 20 MG/ML IV SOLN
INTRAVENOUS | Status: AC
  Filled 2015-10-02: qty 5

## 2015-10-02 MED ORDER — ONDANSETRON HCL 4 MG/2ML IJ SOLN
INTRAMUSCULAR | Status: DC | PRN
  Administered 2015-10-02: 4 mg via INTRAVENOUS

## 2015-10-02 MED ORDER — DEXTROSE 5 % IV SOLN
1.5000 g | INTRAVENOUS | Status: AC
  Administered 2015-10-02: 1.5 g via INTRAVENOUS
  Filled 2015-10-02: qty 1.5

## 2015-10-02 MED ORDER — ROCURONIUM BROMIDE 50 MG/5ML IV SOLN
INTRAVENOUS | Status: AC
  Filled 2015-10-02: qty 1

## 2015-10-02 MED ORDER — FENTANYL CITRATE (PF) 250 MCG/5ML IJ SOLN
INTRAMUSCULAR | Status: AC
  Filled 2015-10-02: qty 5

## 2015-10-02 MED ORDER — SODIUM CHLORIDE 0.9 % IJ SOLN
INTRAMUSCULAR | Status: AC
  Filled 2015-10-02: qty 10

## 2015-10-02 MED ORDER — PROPOFOL 10 MG/ML IV BOLUS
INTRAVENOUS | Status: AC
  Filled 2015-10-02: qty 20

## 2015-10-02 MED ORDER — ACETAMINOPHEN 160 MG/5ML PO SOLN
1000.0000 mg | Freq: Four times a day (QID) | ORAL | Status: AC
Start: 2015-10-02 — End: 2015-10-07
  Filled 2015-10-02 (×7): qty 40

## 2015-10-02 MED ORDER — SUGAMMADEX SODIUM 200 MG/2ML IV SOLN
INTRAVENOUS | Status: AC
  Filled 2015-10-02: qty 2

## 2015-10-02 MED ORDER — PROMETHAZINE HCL 25 MG/ML IJ SOLN: 6.2500 mg | INTRAMUSCULAR | Status: DC | PRN

## 2015-10-02 MED ORDER — SUGAMMADEX SODIUM 200 MG/2ML IV SOLN
INTRAVENOUS | Status: DC | PRN
  Administered 2015-10-02: 80 mg via INTRAVENOUS

## 2015-10-02 MED ORDER — OXYCODONE HCL 5 MG PO TABS
5.0000 mg | ORAL_TABLET | ORAL | Status: DC | PRN
  Administered 2015-10-14: 5 mg via ORAL
  Administered 2015-10-15: 10 mg via ORAL
  Administered 2015-10-16: 5 mg via ORAL
  Administered 2015-10-17 – 2015-10-20 (×6): 10 mg via ORAL
  Filled 2015-10-02 (×2): qty 2
  Filled 2015-10-02: qty 1
  Filled 2015-10-02 (×2): qty 2
  Filled 2015-10-02: qty 1
  Filled 2015-10-02 (×2): qty 2
  Filled 2015-10-02: qty 1
  Filled 2015-10-02: qty 2

## 2015-10-02 MED ORDER — LIDOCAINE HCL (CARDIAC) 20 MG/ML IV SOLN
INTRAVENOUS | Status: DC | PRN
  Administered 2015-10-02: 70 mg via INTRAVENOUS

## 2015-10-02 MED ORDER — DEXTROSE 5 % IV SOLN
1.5000 g | Freq: Two times a day (BID) | INTRAVENOUS | Status: AC
  Administered 2015-10-02 – 2015-10-03 (×2): 1.5 g via INTRAVENOUS
  Filled 2015-10-02 (×2): qty 1.5

## 2015-10-02 MED ORDER — TRAMADOL HCL 50 MG PO TABS
50.0000 mg | ORAL_TABLET | Freq: Four times a day (QID) | ORAL | Status: DC | PRN
  Administered 2015-10-09 – 2015-10-11 (×2): 50 mg via ORAL
  Administered 2015-10-15 – 2015-10-18 (×4): 100 mg via ORAL
  Filled 2015-10-02: qty 2
  Filled 2015-10-02: qty 1
  Filled 2015-10-02: qty 2
  Filled 2015-10-02: qty 1
  Filled 2015-10-02 (×2): qty 2

## 2015-10-02 MED ORDER — HYDROMORPHONE HCL 1 MG/ML IJ SOLN
INTRAMUSCULAR | Status: AC
  Administered 2015-10-02: 0.5 mg via INTRAVENOUS
  Filled 2015-10-02: qty 1

## 2015-10-02 MED ORDER — FENTANYL CITRATE (PF) 100 MCG/2ML IJ SOLN
INTRAMUSCULAR | Status: DC | PRN
  Administered 2015-10-02: 50 ug via INTRAVENOUS
  Administered 2015-10-02: 25 ug via INTRAVENOUS
  Administered 2015-10-02: 50 ug via INTRAVENOUS
  Administered 2015-10-02 (×5): 25 ug via INTRAVENOUS

## 2015-10-02 MED ORDER — HYDROMORPHONE HCL 1 MG/ML IJ SOLN
0.2500 mg | INTRAMUSCULAR | Status: DC | PRN
  Administered 2015-10-02 (×2): 0.5 mg via INTRAVENOUS

## 2015-10-02 MED ORDER — ROCURONIUM BROMIDE 100 MG/10ML IV SOLN
INTRAVENOUS | Status: DC | PRN
  Administered 2015-10-02: 10 mg via INTRAVENOUS
  Administered 2015-10-02: 50 mg via INTRAVENOUS
  Administered 2015-10-02 (×2): 20 mg via INTRAVENOUS
  Administered 2015-10-02: 10 mg via INTRAVENOUS

## 2015-10-02 SURGICAL SUPPLY — 89 items
ADH SKN CLS APL DERMABOND .7 (GAUZE/BANDAGES/DRESSINGS) ×2
APL SRG 22X2 LUM MLBL SLNT (VASCULAR PRODUCTS)
APPLICATOR TIP COSEAL (VASCULAR PRODUCTS) IMPLANT
APPLICATOR TIP EXT COSEAL (VASCULAR PRODUCTS) IMPLANT
BLADE SURG 11 STRL SS (BLADE) IMPLANT
BLADE SURG 15 STRL LF DISP TIS (BLADE) ×4 IMPLANT
BLADE SURG 15 STRL SS (BLADE) ×4
BRUSH CYTOL CELLEBRITY 1.5X140 (MISCELLANEOUS) IMPLANT
CANISTER SUCTION 2500CC (MISCELLANEOUS) ×3 IMPLANT
CATH KIT ON Q 5IN SLV (PAIN MANAGEMENT) IMPLANT
CATH THORACIC 28FR (CATHETERS) IMPLANT
CATH THORACIC 36FR (CATHETERS) IMPLANT
CATH THORACIC 36FR RT ANG (CATHETERS) IMPLANT
CLEANER TIP ELECTROSURG 2X2 (MISCELLANEOUS) ×3 IMPLANT
CLIP TI MEDIUM 6 (CLIP) IMPLANT
CONN ST 1/4X3/8  BEN (MISCELLANEOUS) ×2
CONN ST 1/4X3/8 BEN (MISCELLANEOUS) ×4 IMPLANT
CONN Y 3/8X3/8X3/8  BEN (MISCELLANEOUS)
CONN Y 3/8X3/8X3/8 BEN (MISCELLANEOUS) IMPLANT
CONT SPEC 4OZ CLIKSEAL STRL BL (MISCELLANEOUS) ×12 IMPLANT
COVER SURGICAL LIGHT HANDLE (MISCELLANEOUS) ×3 IMPLANT
COVER TABLE BACK 60X90 (DRAPES) ×3 IMPLANT
DERMABOND ADVANCED (GAUZE/BANDAGES/DRESSINGS) ×1
DERMABOND ADVANCED .7 DNX12 (GAUZE/BANDAGES/DRESSINGS) ×2 IMPLANT
DRAIN CHANNEL 28F RND 3/8 FF (WOUND CARE) ×6 IMPLANT
DRAPE LAPAROSCOPIC ABDOMINAL (DRAPES) ×3 IMPLANT
DRAPE WARM FLUID 44X44 (DRAPE) ×3 IMPLANT
DRILL BIT 7/64X5 (BIT) IMPLANT
ELECT BLADE 4.0 EZ CLEAN MEGAD (MISCELLANEOUS) ×3
ELECT REM PT RETURN 9FT ADLT (ELECTROSURGICAL) ×3
ELECTRODE BLDE 4.0 EZ CLN MEGD (MISCELLANEOUS) ×2 IMPLANT
ELECTRODE REM PT RTRN 9FT ADLT (ELECTROSURGICAL) ×2 IMPLANT
FORCEPS BIOP RJ4 1.8 (CUTTING FORCEPS) IMPLANT
GAUZE SPONGE 4X4 12PLY STRL (GAUZE/BANDAGES/DRESSINGS) ×3 IMPLANT
GLOVE BIO SURGEON STRL SZ 6.5 (GLOVE) ×6 IMPLANT
GOWN STRL REUS W/ TWL LRG LVL3 (GOWN DISPOSABLE) ×6 IMPLANT
GOWN STRL REUS W/TWL LRG LVL3 (GOWN DISPOSABLE) ×9
HANDPIECE INTERPULSE COAX TIP (DISPOSABLE) ×3
IV NS IRRIG 3000ML ARTHROMATIC (IV SOLUTION) ×3 IMPLANT
KIT BASIN OR (CUSTOM PROCEDURE TRAY) ×3 IMPLANT
KIT CLEAN ENDO COMPLIANCE (KITS) ×3 IMPLANT
KIT ROOM TURNOVER OR (KITS) ×3 IMPLANT
KIT SUCTION CATH 14FR (SUCTIONS) ×3 IMPLANT
MARKER SKIN DUAL TIP RULER LAB (MISCELLANEOUS) ×3 IMPLANT
NEEDLE BIOPSY TRANSBRONCH 21G (NEEDLE) IMPLANT
NS IRRIG 1000ML POUR BTL (IV SOLUTION) ×12 IMPLANT
OIL SILICONE PENTAX (PARTS (SERVICE/REPAIRS)) ×3 IMPLANT
PACK CHEST (CUSTOM PROCEDURE TRAY) ×3 IMPLANT
PAD ARMBOARD 7.5X6 YLW CONV (MISCELLANEOUS) ×6 IMPLANT
PASSER SUT SWANSON 36MM LOOP (INSTRUMENTS) IMPLANT
PASSER SUT WORM CVD (INSTRUMENTS) ×3 IMPLANT
SCISSORS LAP 5X35 DISP (ENDOMECHANICALS) IMPLANT
SEALANT PROGEL (MISCELLANEOUS) IMPLANT
SEALANT SURG COSEAL 4ML (VASCULAR PRODUCTS) IMPLANT
SEALANT SURG COSEAL 8ML (VASCULAR PRODUCTS) IMPLANT
SET HNDPC FAN SPRY TIP SCT (DISPOSABLE) ×2 IMPLANT
SOLUTION ANTI FOG 6CC (MISCELLANEOUS) ×3 IMPLANT
SPONGE GAUZE 4X4 12PLY STER LF (GAUZE/BANDAGES/DRESSINGS) ×3 IMPLANT
SUT PROLENE 3 0 SH DA (SUTURE) IMPLANT
SUT PROLENE 4 0 RB 1 (SUTURE)
SUT PROLENE 4-0 RB1 .5 CRCL 36 (SUTURE) IMPLANT
SUT SILK  1 MH (SUTURE) ×4
SUT SILK 1 MH (SUTURE) ×8 IMPLANT
SUT SILK 1 TIES 10X30 (SUTURE) IMPLANT
SUT SILK 2 0SH CR/8 30 (SUTURE) IMPLANT
SUT SILK 3 0SH CR/8 30 (SUTURE) IMPLANT
SUT VIC AB 1 CTX 18 (SUTURE) ×3 IMPLANT
SUT VIC AB 1 CTX 36 (SUTURE)
SUT VIC AB 1 CTX36XBRD ANBCTR (SUTURE) IMPLANT
SUT VIC AB 2-0 CTX 36 (SUTURE) ×3 IMPLANT
SUT VIC AB 3-0 X1 27 (SUTURE) ×3 IMPLANT
SUT VICRYL 0 UR6 27IN ABS (SUTURE) IMPLANT
SUT VICRYL 2 TP 1 (SUTURE) ×3 IMPLANT
SWAB COLLECTION DEVICE MRSA (MISCELLANEOUS) ×6 IMPLANT
SYR 20ML ECCENTRIC (SYRINGE) ×3 IMPLANT
SYSTEM SAHARA CHEST DRAIN ATS (WOUND CARE) ×3 IMPLANT
TAPE CLOTH 4X10 WHT NS (GAUZE/BANDAGES/DRESSINGS) ×3 IMPLANT
TAPE CLOTH SURG 4X10 WHT LF (GAUZE/BANDAGES/DRESSINGS) ×3 IMPLANT
TAPE UMBILICAL COTTON 1/8X30 (MISCELLANEOUS) ×3 IMPLANT
TIP APPLICATOR SPRAY EXTEND 16 (VASCULAR PRODUCTS) IMPLANT
TOWEL OR 17X24 6PK STRL BLUE (TOWEL DISPOSABLE) ×6 IMPLANT
TOWEL OR 17X26 10 PK STRL BLUE (TOWEL DISPOSABLE) ×6 IMPLANT
TRAP SPECIMEN MUCOUS 40CC (MISCELLANEOUS) ×3 IMPLANT
TRAY FOLEY CATH SILVER 16FR LF (SET/KITS/TRAYS/PACK) ×3 IMPLANT
TROCAR XCEL BLUNT TIP 100MML (ENDOMECHANICALS) IMPLANT
TUBE ANAEROBIC SPECIMEN COL (MISCELLANEOUS) ×6 IMPLANT
TUBE CONNECTING 20X1/4 (TUBING) ×6 IMPLANT
TUNNELER SHEATH ON-Q 11GX8 DSP (PAIN MANAGEMENT) IMPLANT
WATER STERILE IRR 1000ML POUR (IV SOLUTION) ×6 IMPLANT

## 2015-10-02 NOTE — Anesthesia Preprocedure Evaluation (Addendum)
Anesthesia Evaluation  Patient identified by MRN, date of birth, ID band Patient awake    Reviewed: Allergy & Precautions, NPO status , Patient's Chart, lab work & pertinent test results  Airway Mallampati: II  TM Distance: >3 FB Neck ROM: Full    Dental no notable dental hx. (+) Dental Advisory Given   Pulmonary pneumonia, unresolved,    breath sounds clear to auscultation + decreased breath sounds      Cardiovascular hypertension, Pt. on medications Normal cardiovascular exam Rhythm:Regular Rate:Normal     Neuro/Psych negative neurological ROS  negative psych ROS   GI/Hepatic negative GI ROS, Neg liver ROS,   Endo/Other  negative endocrine ROS  Renal/GU negative Renal ROS  negative genitourinary   Musculoskeletal negative musculoskeletal ROS (+)   Abdominal   Peds negative pediatric ROS (+)  Hematology negative hematology ROS (+)   Anesthesia Other Findings   Reproductive/Obstetrics negative OB ROS                            Anesthesia Physical Anesthesia Plan  ASA: III  Anesthesia Plan: General   Post-op Pain Management:    Induction: Intravenous  Airway Management Planned: Double Lumen EBT  Additional Equipment: CVP  Intra-op Plan:   Post-operative Plan: Extubation in OR  Informed Consent: I have reviewed the patients History and Physical, chart, labs and discussed the procedure including the risks, benefits and alternatives for the proposed anesthesia with the patient or authorized representative who has indicated his/her understanding and acceptance.   Dental advisory given  Plan Discussed with: CRNA and Surgeon  Anesthesia Plan Comments:         Anesthesia Quick Evaluation

## 2015-10-02 NOTE — Anesthesia Procedure Notes (Addendum)
Procedure Name: Intubation Date/Time: 10/02/2015 7:41 AM Performed by: Garrison Columbus T Pre-anesthesia Checklist: Patient identified, Emergency Drugs available, Suction available and Patient being monitored Patient Re-evaluated:Patient Re-evaluated prior to inductionOxygen Delivery Method: Circle system utilized Preoxygenation: Pre-oxygenation with 100% oxygen Intubation Type: IV induction Ventilation: Mask ventilation without difficulty Laryngoscope Size: Mac and 4 Grade View: Grade I Tube type: Oral Tube size: 8.5 mm Number of attempts: 1 Airway Equipment and Method: Stylet Placement Confirmation: ETT inserted through vocal cords under direct vision,  positive ETCO2 and breath sounds checked- equal and bilateral Secured at: 22 cm Tube secured with: Tape Dental Injury: Teeth and Oropharynx as per pre-operative assessment  Comments: Intubation performed by Genia Hotter   Procedure Name: Intubation Date/Time: 10/02/2015 7:56 AM Performed by: Garrison Columbus T Pre-anesthesia Checklist: Patient identified, Emergency Drugs available, Suction available and Patient being monitored Patient Re-evaluated:Patient Re-evaluated prior to inductionOxygen Delivery Method: Circle system utilized Preoxygenation: Pre-oxygenation with 100% oxygen Intubation Type: Inhalational induction Ventilation: Mask ventilation without difficulty Laryngoscope Size: Miller and 2 Grade View: Grade I Endobronchial tube: Double lumen EBT, EBT position confirmed by fiberoptic bronchoscope and EBT position confirmed by auscultation and 37 Fr Number of attempts: 2 Airway Equipment and Method: Stylet Placement Confirmation: ETT inserted through vocal cords under direct vision,  positive ETCO2 and breath sounds checked- equal and bilateral Tube secured with: Tape Dental Injury: Teeth and Oropharynx as per pre-operative assessment  Comments: DL x 1 by Genia Hotter with Mac 4 to obtain grade 1 view. Unable to get DLT to make  curve of blade with anterior larynx. DL x 1 with Sabra Heck 2 by CRNA. Grade 1 view obtained with atraumatic placement of DLT.

## 2015-10-02 NOTE — Transfer of Care (Signed)
Immediate Anesthesia Transfer of Care Note  Patient: [name] [name]  Procedure(s) Performed: Procedure(s): VIDEO ASSISTED THORACOSCOPY (VATS)/DECORTICATION and drainage of chronic empyena (Left) VIDEO BRONCHOSCOPY (N/A)  Patient Location: PACU  Anesthesia Type:General  Level of Consciousness: awake and patient cooperative  Airway & Oxygen Therapy: Patient Spontanous Breathing and Patient connected to face mask oxygen  Post-op Assessment: Report given to RN, Post -op Vital signs reviewed and stable and Patient moving all extremities X 4  Post vital signs: Reviewed and stable  Last Vitals:  Filed Vitals:   10/02/15 0624  BP: 135/92  Pulse: 77  Temp: 36.4 C  Resp: 18    Complications: No apparent anesthesia complications

## 2015-10-02 NOTE — Brief Op Note (Addendum)
      [address]       [place] [address]             [phone]     10/02/2015  10:37 AM  PATIENT:  [name] [name]  54 y.o. male  PRE-OPERATIVE DIAGNOSIS:  Left plerual effusion  POST-OPERATIVE DIAGNOSIS: 1. Left plerual effusion 2. Chronic left empyema  PROCEDURE: VIDEO BRONCHOSCOPY ,LEFT VIDEO ASSISTED THORACOSCOPY (VATS), LEFT MINI THORACOTOMY, DECORTICATION and DRAINAGE OF LEFT EMPYEMA  SURGEON:  Surgeon(s) and Role:    * [name], MD - Primary  PHYSICIAN ASSISTANT: Lars Pinks PA-C  ANESTHESIA:   general  EBL:  Total I/O In: 1000 [I.V.:1000] Out: 175 [Urine:75; Blood:100]  BLOOD ADMINISTERED:none  DRAINS: 55 Blake drain placed in the left pleural space   SPECIMEN:  Source of Specimen:  Left pleural fluid, left pleural peel, empyema  DISPOSITION OF SPECIMEN:  Pathology, gram stain, and cultures  COUNTS CORRECT:  YES  DICTATION: .Dragon Dictation  PLAN OF CARE: Admit to inpatient   PATIENT DISPOSITION:  PACU - hemodynamically stable.   Delay start of Pharmacological VTE agent (>24hrs) due to surgical blood loss or risk of bleeding: yes

## 2015-10-02 NOTE — Anesthesia Postprocedure Evaluation (Signed)
Anesthesia Post Note  Patient: [name] [name]  Procedure(s) Performed: Procedure(s) (LRB): VIDEO ASSISTED THORACOSCOPY (VATS)/DECORTICATION and drainage of chronic empyena (Left) VIDEO BRONCHOSCOPY (N/A)  Patient location during evaluation: PACU Anesthesia Type: General Level of consciousness: awake and alert Pain management: pain level controlled Vital Signs Assessment: post-procedure vital signs reviewed and stable Respiratory status: spontaneous breathing, nonlabored ventilation, respiratory function stable and patient connected to nasal cannula oxygen Cardiovascular status: blood pressure returned to baseline and stable Postop Assessment: No signs of nausea or vomiting Anesthetic complications: no    Last Vitals:  Filed Vitals:   10/02/15 1155 10/02/15 1210  BP: 124/76 121/68  Pulse:  77  Temp:    Resp: 11 8    Last Pain:  Filed Vitals:   10/02/15 1225  PainSc: Asleep                 Walfred Bettendorf S

## 2015-10-02 NOTE — H&P (Signed)
[address]       [place] [address]             [phone]      Subjective:   Patient is a 54 y.o. male who presented to the emergency department last week  with c/o  abdominal pain, diarrhea and increasing dyspnea. 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  Over the past approximate 3 weeks there has been notable increased dyspnea and decreased exercise tolerance which his nephew described as "like asthma". He has had chest discomfort with exertion with possible esophageal spasm  Symptoms. He is having bowel movements.  He presented to urgent care where a chest x-ray showed a large left pleural effusion and the patient was referred to the emergency department. The patient had a CT angiogram which revealed the following:  IMPRESSION: 1. No evidence of pulmonary embolus. 2. Loculated complex left-sided pleural effusion has increased mildly in size, with minimal air now seen in the collection, raising concern for superinfection or evolving empyema. Would correlate clinically. 3. Dilatation of the main pulmonary artery, measuring 4.8 cm in diameter, raising question for underlying pulmonary arterial hypertension. 4. Scattered calcified pleural plaques raise question for underlying prior asbestos exposure. Mild scarring at the right lung base. 5. Mild emphysematous change at the upper lung lobes.  He was admitted for further evaluation and treatment . the patient does not have a leukocytosis. He does not have a productive cough. He is afebrile and hemodynamically stable although at times hypertensive. Oxygen saturations are greater than 90 on room air. He is currently on 2 L nasal cannula with saturations at 99-100%. He does have some intermittent diaphoresis, but no chills of fevers   CT guided biopsy attempt drainage of left chest fluid showed  fibrious tissue  In 2003 I did limited decortication left lower chest. Current findings not present on films several years ago. He has been evaluated for Tb in past .   Patient Active Problem List   Diagnosis Date Noted  . Diarrhea 09/27/2015  . Chest pain 09/27/2015  . Unintentional weight loss 09/27/2015  . Abdominal cramps 09/26/2015  . Pleural effusion 09/26/2015  . Pleural effusion, left 09/26/2015  left thoracotomy, drainage of empyema 2003   Past Medical History  Diagnosis Date  . Kidney stones   . Hypertension     Past Surgical History  Procedure Laterality Date  . Kidney surgery    . Video assisted thoracoscopy (vats)/decortication Left 2003    No prescriptions prior to admission   No Known Allergies  Social History  Substance Use Topics  . Smoking status: Never Smoker   . Smokeless tobacco: Not on file  . Alcohol Use: Yes    History reviewed. No pertinent family history.  Review of Systems Pertinent items are noted in HPI.  Objective:   Patient Vitals for the past 8 hrs:  BP Temp Temp src Pulse Resp SpO2 Weight  10/02/15 0624 (!) 135/92 mmHg 97.6 F (36.4 C) Oral 77 18 96 % 93 lb (42.185 kg)   BP 135/92 mmHg  Pulse 77  Temp(Src) 97.6 F (36.4 C) (Oral)  Resp 18  Wt 93 lb (42.185 kg)  SpO2 96%  General Appearance:    Alert, cooperative, no distress, appears stated age  Head:    Normocephalic, without obvious abnormality, atraumatic  Eyes:    PERRL,  conjunctiva/corneas erythematous,           Throat:   Lips, mucosa, and tongue normal; teeth fair dentition and gums normal  Neck:   Supple, symmetrical, trachea midline, no adenopathy;       thyroid:  No enlargement/tenderness/nodules; no carotid   bruit or JVD  Back:     Symmetric, no curvature, ROM normal, no CVA tenderness  Lungs:     Sl dim on left but mostly clear, no rales or ronchi or wheeze  Chest wall:    No tenderness or deformity  Heart:    Regular rate and rhythm, S1 and S2 normal, no  murmur, rub   or gallop  Abdomen:     Soft, non-tender, bowel sounds active all four quadrants,    no masses, no organomegaly. Quite thin, aorta is palpable  Genitalia:    Not evaluated  Rectal:    Not evaluated  Extremities:   Extremities normal, atraumatic, no cyanosis or edema  Pulses:   2+ and symmetric all extremities  Skin:   Skin color, texture, turgor normal, no rashes or lesions  Lymph nodes:   Cervical, supraclavicular, and axillary nodes normal  Neurologic:   Grossly non focal    Data Review:  CBC:  Lab Results  Component Value Date   WBC 6.1 09/30/2015   RBC 5.91* 09/30/2015   BMP:  Lab Results  Component Value Date   GLUCOSE 91 09/30/2015   CO2 32 09/30/2015   BUN 12 09/30/2015   CREATININE 1.17 09/30/2015   CALCIUM 9.4 09/30/2015   Coagulation:  Lab Results  Component Value Date   INR 1.16 09/26/2015   APTT 32 09/26/2015   Cardiac markers: No results found for: CKMB, TROPONINT, MYOGLOBIN ABGs: No results found for: PH  ECG: LVH Chest X-Ray: Dg Chest 2 View  09/25/2015  CLINICAL DATA:  54 year old male with chest pain EXAM: CHEST  2 VIEW COMPARISON:  Prior chest x-ray 11/10/2012 FINDINGS: Compared to prior imaging there has been slight interval increase in the volume of the loculated left pleural effusion which now has a convex margin at the superior extent. There is associated atelectasis of the adjacent lung. Stable calcified pleural plaques in the right mid and lower thorax. Cardiac and mediastinal contours are unchanged. The main and central pulmonary arteries are markedly enlarged. No pneumothorax. No new focal airspace consolidation. Osseous structures are intact and unremarkable. IMPRESSION: Compared to 11/10/2012 the chronic loculated left pleural effusion has enlarged and developed more convex margins suggesting the possibility of superinfection or development of empyema. Otherwise, similar appearance of the chest with enlarged main and central pulmonary  arteries suggesting pulmonary arterial hypertension and calcified pleural plaques bilaterally. Electronically Signed   By: Jacqulynn Cadet M.D.   On: 09/25/2015 20:08   Ct Angio Chest Pe W/cm &/or Wo Cm  09/26/2015  CLINICAL DATA:  Subacute onset of intermittent progressive shortness of breath, and dyspnea on exertion and rest. Midsternal chest burning. Initial encounter. EXAM: CT ANGIOGRAPHY CHEST WITH CONTRAST TECHNIQUE: Multidetector CT imaging of the chest was performed using the standard protocol during bolus administration of intravenous contrast. Multiplanar CT image reconstructions and MIPs were obtained to evaluate the vascular anatomy. CONTRAST:  31mL OMNIPAQUE IOHEXOL 350 MG/ML SOLN COMPARISON:  Chest radiograph performed 09/25/2015, and CT of the chest performed 04/17/2011 FINDINGS: There is no evidence of pulmonary embolus. The patient's loculated complex left-sided pleural effusion has increased mildly in size, with minimal air in the collection, raising concern for superinfection or  evolving empyema. Scattered calcified pleural plaques raise question for underlying prior asbestos exposure. Mild scarring is again noted at the right lung base. Mild emphysematous change is noted at the upper lung lobes. There is no evidence of pneumothorax. No masses are identified; no abnormal focal contrast enhancement is seen. There is dilatation of the main pulmonary artery, measuring 4.8 cm in diameter, raising question for underlying pulmonary arterial hypertension. No pericardial effusion is identified. The great vessels are grossly unremarkable. No mediastinal lymphadenopathy is seen. No axillary lymphadenopathy is seen. The visualized portions of the thyroid gland are unremarkable in appearance. There is reflux of contrast into the hepatic veins and IVC. The visualized portions of the liver and spleen are unremarkable. The visualized portions of the pancreas, gallbladder, stomach, adrenal glands and  kidneys are within normal limits. No acute osseous abnormalities are seen. Review of the MIP images confirms the above findings. IMPRESSION: 1. No evidence of pulmonary embolus. 2. Loculated complex left-sided pleural effusion has increased mildly in size, with minimal air now seen in the collection, raising concern for superinfection or evolving empyema. Would correlate clinically. 3. Dilatation of the main pulmonary artery, measuring 4.8 cm in diameter, raising question for underlying pulmonary arterial hypertension. 4. Scattered calcified pleural plaques raise question for underlying prior asbestos exposure. Mild scarring at the right lung base. 5. Mild emphysematous change at the upper lung lobes. Electronically Signed   By: Garald Balding M.D.   On: 09/26/2015 00:57   2014     2016:   Assessment:   Chronic loculated left pleural effusion. There is possibility this could be evolving to an empyema but appears generally well and not infected.  He has evidence of pulmonary HTN on CTA  and LVH and ? Left Atrial enlargement n EKG so echocardiogram may be indicated as diastolic dysfunction may be a contributor to his dyspnea.   Plan:   Plan Bronchoscopy and left VATS for drainage and biopsy culture.  The goals risks and alternatives of the planned surgical procedure Bronchoscopy and left VATS   have been discussed with the patient in detail. The risks of the procedure including death, infection, stroke, myocardial infarction, bleeding, blood transfusion have all been discussed specifically.  I have quoted [name] a 2 % of perioperative mortality and a complication rate as high as 20%. The patient's questions have been answered.Aleem Skillin is willing  to proceed with the planned procedure.   [name] MD      [address] [place] [address] Office [phone]   [place]

## 2015-10-03 ENCOUNTER — Inpatient Hospital Stay (HOSPITAL_COMMUNITY): Payer: BLUE CROSS/BLUE SHIELD

## 2015-10-03 ENCOUNTER — Encounter (HOSPITAL_COMMUNITY): Payer: Self-pay | Admitting: Cardiothoracic Surgery

## 2015-10-03 LAB — BLOOD GAS, ARTERIAL
Acid-Base Excess: 5.7 mmol/L — ABNORMAL HIGH (ref 0.0–2.0)
BICARBONATE: 31.4 meq/L — AB (ref 20.0–24.0)
O2 Content: 2 L/min
O2 SAT: 99.3 %
PATIENT TEMPERATURE: 98.6
PO2 ART: 154 mmHg — AB (ref 80.0–100.0)
TCO2: 33.2 mmol/L (ref 0–100)
pCO2 arterial: 61 mmHg (ref 35.0–45.0)
pH, Arterial: 7.331 — ABNORMAL LOW (ref 7.350–7.450)

## 2015-10-03 LAB — BASIC METABOLIC PANEL
Anion gap: 5 (ref 5–15)
BUN: 8 mg/dL (ref 6–20)
CHLORIDE: 97 mmol/L — AB (ref 101–111)
CO2: 31 mmol/L (ref 22–32)
CREATININE: 0.84 mg/dL (ref 0.61–1.24)
Calcium: 7.7 mg/dL — ABNORMAL LOW (ref 8.9–10.3)
GFR calc Af Amer: >60 mL/min (ref >60)
GLUCOSE: 158 mg/dL — AB (ref 65–99)
POTASSIUM: 4.8 mmol/L (ref 3.5–5.1)
SODIUM: 133 mmol/L — AB (ref 135–145)

## 2015-10-03 LAB — CBC
HCT: 35.3 % — ABNORMAL LOW (ref 39.0–52.0)
Hemoglobin: 11.8 g/dL — ABNORMAL LOW (ref 13.0–17.0)
MCH: 29.1 pg (ref 26.0–34.0)
MCHC: 33.4 g/dL (ref 30.0–36.0)
MCV: 87.2 fL (ref 78.0–100.0)
PLATELETS: 184 10*3/uL (ref 150–400)
RBC: 4.05 MIL/uL — ABNORMAL LOW (ref 4.22–5.81)
RDW: 13.1 % (ref 11.5–15.5)
WBC: 9.9 10*3/uL (ref 4.0–10.5)

## 2015-10-03 LAB — O&P RESULT

## 2015-10-03 LAB — PATHOLOGIST SMEAR REVIEW

## 2015-10-03 LAB — OVA + PARASITE EXAM

## 2015-10-03 NOTE — Op Note (Signed)
NAMECLEO, [name] NO.:  [id number]  MEDICAL RECORD NO.:  [id number]  LOCATION:  3S04C                        FACILITY:  [facility]  PHYSICIAN:  Lanelle Bal, MD    DATE OF BIRTH:  [birth year]  DATE OF PROCEDURE:  10/02/2015 DATE OF DISCHARGE:                              OPERATIVE REPORT   PREOPERATIVE DIAGNOSIS:  Left pleural effusion.  POSTOPERATIVE DIAGNOSIS:  Left chronic empyema.  PROCEDURE PERFORMED:  Video bronchoscopy, left video-assisted thoracoscopy, mini thoracotomy, drainage of empyema with decortication.  SURGEON:  Lanelle Bal, MD  FIRST ASSISTANT:  Lars Pinks, PA  BRIEF HISTORY:  The patient is a 54 year old [place]  male who lived in this country since 1989.  In 2003, the patient had undergone a drainage of a left chest empyema at the base.  He has done well.  Serial chest x-rays had been remained relatively stable.  He was evaluated in 2012 for possible tuberculosis, but this was not confirmed.  The patient on this admission was admitted with vague abdominal discomfort and left chest discomfort.  Chest x-ray and CT showed progressive effusion/pleural mass in the left chest that was a change from the chronic appearance of his chest x-ray from several years previously. Attempt at percutaneous drainage and biopsy was unrevealing as to assist in the diagnosis and recommended to the patient that we proceed with video-assisted thoracoscopy for drainage and diagnosis.  The patient agreed and signed informed consent.  DESCRIPTION OF PROCEDURE:  The patient underwent general endotracheal anesthesia with a single-lumen endotracheal tube.  Through this endotracheal tube, a video bronchoscope was passed to the segmental level both in the left and right tracheobronchial tree.  There were no evidence of endobronchial lesions.  Bronchial washings from the left lung were obtained and sent for culture including AFB.  The scope was then  removed.  A double-lumen endotracheal tube was placed and the patient was turned in lateral decubitus position with the left side up. A second time-out was performed.  The left side had previously been marked.  We initially made a small port incision proximately at the fourth intercostal space, anterior axillary line above the previous incision.  Upon entering the chest, significant amount of brown purulent material drained, this was cultured and sent for cytology with as much as possible drained through this port, we started with the 30-degree scope and it became apparent that the patient's chest x-ray findings and CT findings were consistent with a chronic old empyema cavity. Preoperatively, the patient has had no elevated white count or fever. There was a thick peel on the involved lung.  We enlarged the port site and placed a small retractor through this incision proceeded with extensive decortication of the area both on the lung and the pleural surface and ultimately ended with relatively good expansion of the left lung.  A single Blake drain was left in the space.  Small two holes were drilled in the lower rib and two pericostal sutures were placed.  The lung was re-expanded.  Muscle layers were closed with interrupted 0 Vicryl, running 2-0 Vicryl in the subcutaneous tissue and a 3-0 subcuticular stitch in skin edges.  Dry dressings were applied.  Sponge and needle counts were reported as correct at completion of the procedure.  Blood loss was approximately 100 mL.  The patient was extubated in the operating room and transferred to the recovery room for further postoperative care.     Lanelle Bal, MD     EG/MEDQ  D:  10/03/2015  T:  10/03/2015  Job:  [id number]

## 2015-10-03 NOTE — Progress Notes (Signed)
Critical value CO2 61 Dr.  Roxy Manns notified no new orders given will continue to monitor.

## 2015-10-03 NOTE — Clinical Documentation Improvement (Signed)
Cardiothoracic  Can the diagnosis of diastolic dysfunction be further specified?    Acuity - Acute, Chronic, Acute on Chronic   Type - Systolic, Diastolic, Systolic and Diastolic  Other  Clinically Undetermined  Document any associated diagnoses/conditions Please update your documentation within the medical record to reflect your response to this query. Thank you.  Supporting Information:(As per notes) He has evidence of pulmonary HTN on CTA and LVH and ? Left Atrial enlargement n EKG so echocardiogram may be indicated as diastolic dysfunction may be a contributor to his dyspnea.   Please exercise your independent, professional judgment when responding. A specific answer is not anticipated or expected.  Thank You, Alessandra Grout, RN, BSN, CCDS,Clinical Documentation Specialist:  [phone]  [phone]=[facility]- Health Information Management

## 2015-10-03 NOTE — Progress Notes (Addendum)
[address]       [place] [address]             [phone]          1 Day Post-Op Procedure(s) (LRB): VIDEO ASSISTED THORACOSCOPY (VATS)/DECORTICATION and drainage of chronic empyena (Left) VIDEO BRONCHOSCOPY (N/A)  Subjective: Feeling "better" overall this am.  Sore at CT site, but breathing much improved.   Objective: Vital signs in last 24 hours: Patient Vitals for the past 24 hrs:  BP Temp Temp src Pulse Resp SpO2 Height Weight  10/03/15 0312 97/67 mmHg 97.6 F (36.4 C) Oral 74 17 100 % - -  10/02/15 2315 102/71 mmHg 97.8 F (36.6 C) Oral 75 18 100 % - -  10/02/15 2314 - 98.4 F (36.9 C) Oral - - - - -  10/02/15 1920 112/73 mmHg - - 78 17 100 % - -  10/02/15 1911 - 98.2 F (36.8 C) Oral - - - - -  10/02/15 1459 112/64 mmHg 98 F (36.7 C) Oral 94 19 99 % 5' (1.524 m) 92 lb 15.8 oz (42.18 kg)  10/02/15 1400 103/74 mmHg 97.8 F (36.6 C) - 82 11 97 % - -  10/02/15 1310 113/73 mmHg 97.2 F (36.2 C) - - 12 - - -  10/02/15 1255 121/73 mmHg - - - 10 100 % - -  10/02/15 1240 112/75 mmHg - - - (!) 9 99 % - -  10/02/15 1210 121/68 mmHg - - 77 (!) 8 100 % - -  10/02/15 1155 124/76 mmHg - - - 11 - - -  10/02/15 1140 (!) 128/46 mmHg - - 73 15 100 % - -  10/02/15 1125 126/81 mmHg - - 75 14 100 % - -  10/02/15 1115 - - - - 17 (!) 89 % - -  10/02/15 1110 129/71 mmHg 97 F (36.1 C) - - 16 - - -   Current Weight  10/02/15 92 lb 15.8 oz (42.18 kg)     Intake/Output from previous day: 11/28 0701 - 11/29 0700 In: 2765 [P.O.:240; I.V.:2525] Out: 1595 [Urine:1225; Blood:100; Chest Tube:270]    PHYSICAL EXAM:  Heart: RRR Lungs: Coarse rhonchi Wound: Dressed and dry Chest tube: Intermittent 1/7 air leak with cough    Lab Results: CBC: Recent Labs  09/30/15 1630 10/03/15 0323  WBC 6.1 9.9  HGB 17.3* 11.8*  HCT 51.3 35.3*  PLT 217 184   BMET:  Recent Labs  09/30/15 1630 10/03/15 0323  NA 132* 133*  K 4.3 4.8  CL 94* 97*  CO2  32 31  GLUCOSE 91 158*  BUN 12 8  CREATININE 1.17 0.84  CALCIUM 9.4 7.7*    PT/INR: No results for input(s): LABPROT, INR in the last 72 hours.  CXR: FINDINGS: Left chest tube in stable position. No pneumothorax. Stable cardiomegaly. Persistent left base subsegmental atelectasis and or infiltrate. Persistent left pleural thickening. Calcified pleural plaques.  IMPRESSION: 1. Stable chest with stable positioning of left chest tube. No pneumothorax.  2. Persistent left base atelectasis and or infiltrate.  3. Persistent mild left pleural effusion and/or thickening. Calcified pleural plaques again noted .   Assessment/Plan: S/P Procedure(s) (LRB): VIDEO ASSISTED THORACOSCOPY (VATS)/DECORTICATION and drainage of chronic empyena (Left) VIDEO BRONCHOSCOPY (N/A) CXR stable, tiny intermittent air leak with cough.  Continue CT to suction for now. ID- Afebrile, WBC up slightly today. Continue IV Zinacef for now. Cultures pending. Pulm- will start IS/FV, pulm toilet.  Mobilize, d/c a-line, Foley, routine POD #1 progression.   LOS: 1 day    COLLINS,GINA H 10/03/2015  Recent Results (from the past 240 hour(s))  Tissue culture     Status: None   Collection Time: 09/27/15  4:00 PM  Result Value Ref Range Status   Specimen Description TISSUE LEFT PLEURAL  Final   Special Requests NONE  Final   Gram Stain   Final    NO WBC SEEN NO SQUAMOUS EPITHELIAL CELLS SEEN NO ORGANISMS SEEN Performed at [facility]    Culture   Final    NO GROWTH 3 DAYS Performed at [facility]    Report Status 10/01/2015 FINAL  Final  Surgical pcr screen     Status: None   Collection Time: 10/02/15  6:48 AM  Result Value Ref Range Status   MRSA, PCR NEGATIVE NEGATIVE Final   Staphylococcus aureus NEGATIVE NEGATIVE Final    Comment:        The Xpert SA Assay (FDA approved for NASAL specimens in patients over 33 years of age), is one component of a comprehensive  surveillance program.  Test performance has been validated by [facility] for patients greater than or equal to 88 year old. It is not intended to diagnose infection nor to guide or monitor treatment.   Culture, body fluid-bottle     Status: None (Preliminary result)   Collection Time: 10/02/15  8:39 AM  Result Value Ref Range Status   Specimen Description FLUID LEFT PLEURAL  Final   Special Requests BOTTLES DRAWN AEROBIC AND ANAEROBIC 5CCS  Final   Culture PENDING  Incomplete   Report Status PENDING  Incomplete  Gram stain     Status: None   Collection Time: 10/02/15  8:39 AM  Result Value Ref Range Status   Specimen Description FLUID LEFT PLEURAL  Final   Special Requests NONE  Final   Gram Stain   Final    DEGENERATED CELLULAR MATERIAL PRESENT NO ORGANISMS SEEN    Report Status 10/02/2015 FINAL  Final  Body fluid culture     Status: None (Preliminary result)   Collection Time: 10/02/15  8:50 AM  Result Value Ref Range Status   Specimen Description FLUID PLEURAL LEFT  Final   Special Requests SPEC C ON SWAB  Final   Gram Stain   Final    MODERATE WBC PRESENT,BOTH PMN AND MONONUCLEAR NO ORGANISMS SEEN    Culture PENDING  Incomplete   Report Status PENDING  Incomplete  Anaerobic culture     Status: None (Preliminary result)   Collection Time: 10/02/15  8:53 AM  Result Value Ref Range Status   Specimen Description TISSUE LUNG LEFT  Final   Special Requests SPEC D  Final   Gram Stain   Final    NO WBC SEEN NO ORGANISMS SEEN Performed at [facility]    Culture PENDING  Incomplete   Report Status PENDING  Incomplete  Tissue culture     Status: None (Preliminary result)   Collection Time: 10/02/15  8:53 AM  Result Value Ref Range Status   Specimen Description TISSUE LUNG LEFT  Final   Special Requests SPEC D PER ALLYSA HEGARTY UNABLE TO MODIFY IN EPIC  Final   Gram Stain   Final    NO WBC SEEN NO ORGANISMS SEEN Performed at [facility]    Culture  PENDING  Incomplete   Report Status PENDING  Incomplete    I have seen and  examined [name] [name] and agree with the above assessment  and plan.  [name] MD Beeper [phone] Office [phone] 10/03/2015 9:29 AM

## 2015-10-04 ENCOUNTER — Inpatient Hospital Stay (HOSPITAL_COMMUNITY): Payer: BLUE CROSS/BLUE SHIELD

## 2015-10-04 LAB — COMPREHENSIVE METABOLIC PANEL
ALBUMIN: 2.1 g/dL — AB (ref 3.5–5.0)
ALK PHOS: 51 U/L (ref 38–126)
ALT: 41 U/L (ref 17–63)
ANION GAP: 5 (ref 5–15)
AST: 39 U/L (ref 15–41)
BILIRUBIN TOTAL: 0.3 mg/dL (ref 0.3–1.2)
BUN: 6 mg/dL (ref 6–20)
CALCIUM: 7.9 mg/dL — AB (ref 8.9–10.3)
CO2: 32 mmol/L (ref 22–32)
Chloride: 99 mmol/L — ABNORMAL LOW (ref 101–111)
Creatinine, Ser: 0.88 mg/dL (ref 0.61–1.24)
GFR calc Af Amer: >60 mL/min (ref >60)
GFR calc non Af Amer: >60 mL/min (ref >60)
GLUCOSE: 90 mg/dL (ref 65–99)
Potassium: 4.6 mmol/L (ref 3.5–5.1)
Sodium: 136 mmol/L (ref 135–145)
TOTAL PROTEIN: 6.6 g/dL (ref 6.5–8.1)

## 2015-10-04 LAB — CBC
HEMATOCRIT: 36.8 % — AB (ref 39.0–52.0)
HEMOGLOBIN: 11.7 g/dL — AB (ref 13.0–17.0)
MCH: 28.3 pg (ref 26.0–34.0)
MCHC: 31.8 g/dL (ref 30.0–36.0)
MCV: 89.1 fL (ref 78.0–100.0)
Platelets: 214 10*3/uL (ref 150–400)
RBC: 4.13 MIL/uL — ABNORMAL LOW (ref 4.22–5.81)
RDW: 13.6 % (ref 11.5–15.5)
WBC: 5.9 10*3/uL (ref 4.0–10.5)

## 2015-10-04 NOTE — Progress Notes (Signed)
46mL of fentanyl syringe wasted with Alinda Deem RN witness

## 2015-10-04 NOTE — Progress Notes (Addendum)
       [address]       [place] [address]             [phone]          2 Days Post-Op Procedure(s) (LRB): VIDEO ASSISTED THORACOSCOPY (VATS)/DECORTICATION and drainage of chronic empyena (Left) VIDEO BRONCHOSCOPY (N/A)  Subjective: A little sore at CT site, but otherwise doing well.  Walked already this am. Breathing stable, no cough.   Objective: Vital signs in last 24 hours: Patient Vitals for the past 24 hrs:  BP Temp Temp src Pulse Resp SpO2  10/04/15 0441 - - - - 14 100 %  10/04/15 0355 107/66 mmHg 98.1 F (36.7 C) Oral 76 14 100 %  10/04/15 0353 - 98.1 F (36.7 C) Oral - - -  10/04/15 0001 - - - - 18 92 %  10/03/15 2309 - 98.2 F (36.8 C) Oral - - -  10/03/15 2305 98/68 mmHg 98.2 F (36.8 C) Oral 91 17 95 %  10/03/15 2123 - - - - 20 96 %  10/03/15 1931 123/82 mmHg 98.4 F (36.9 C) Oral 96 (!) 24 96 %  10/03/15 1703 122/72 mmHg 98.7 F (37.1 C) Oral 94 20 93 %  10/03/15 1234 118/81 mmHg 97.7 F (36.5 C) Oral 80 19 -   Current Weight  10/02/15 92 lb 15.8 oz (42.18 kg)     Intake/Output from previous day: 11/29 0701 - 11/30 0700 In: 650 [I.V.:650] Out: 2500 [Urine:2350; Chest Tube:150]    PHYSICAL EXAM:  Heart: RRR Lungs: Decreased BS in L base, but overall much clearer today Wound: Clean and dry Chest tube: No air leak     Lab Results: CBC: Recent Labs  10/03/15 0323 10/04/15 0520  WBC 9.9 5.9  HGB 11.8* 11.7*  HCT 35.3* 36.8*  PLT 184 214   BMET:  Recent Labs  10/03/15 0323 10/04/15 0520  NA 133* 136  K 4.8 4.6  CL 97* 99*  CO2 31 32  GLUCOSE 158* 90  BUN 8 6  CREATININE 0.84 0.88  CALCIUM 7.7* 7.9*    PT/INR: No results for input(s): LABPROT, INR in the last 72 hours.   CXR: FINDINGS: No change in the pleural and parenchymal disease in the left chest. Stable position of the left chest tube. Negative for pneumothorax. Again noted is an enlargement of the pulmonary artery  vasculature. Cardiomediastinal silhouette is grossly unchanged.  IMPRESSION: Stable pleural and parenchymal disease in the left chest and stable position of the left chest tube.  No pneumothorax.  Persistent enlargement of the pulmonary arteries.   Assessment/Plan: S/P Procedure(s) (LRB): VIDEO ASSISTED THORACOSCOPY (VATS)/DECORTICATION and drainage of chronic empyena (Left) VIDEO BRONCHOSCOPY (N/A) CT with no air leak today and low output. CXR stable. Will place CT to water seal and watch. ID- Cultures no growth today, WBC trending down, no fever.  Continue ambulation, pulm toilet.   LOS: 2 days    COLLINS,GINA H 10/04/2015  Path no maligancy I have seen and examined [name] [name] and agree with the above assessment  and plan.  [name] MD Beeper [phone] Office [phone] 10/04/2015 8:57 AM

## 2015-10-05 ENCOUNTER — Inpatient Hospital Stay (HOSPITAL_COMMUNITY): Payer: BLUE CROSS/BLUE SHIELD

## 2015-10-05 LAB — BODY FLUID CULTURE: Culture: NO GROWTH

## 2015-10-05 LAB — CULTURE, RESPIRATORY W GRAM STAIN: Gram Stain: NONE SEEN

## 2015-10-05 NOTE — Progress Notes (Addendum)
Pt to [place] to 2W-33, VSS, called report. Offered to call family, patient said he would notify of [place].

## 2015-10-05 NOTE — Progress Notes (Signed)
Patient ID: Coray Critchley, male   DOB: 11/26/1960, 54 y.o.   MRN: [id number] TCTS DAILY ICU PROGRESS NOTE                   [address]            [name] [address]          [phone]   3 Days Post-Op Procedure(s) (LRB): VIDEO ASSISTED THORACOSCOPY (VATS)/DECORTICATION and drainage of chronic empyena (Left) VIDEO BRONCHOSCOPY (N/A)  Total Length of Stay:  LOS: 3 days   Subjective: Feels better, eating well  Objective: Vital signs in last 24 hours: Temp:  [97.9 F (36.6 C)-98.5 F (36.9 C)] 98.4 F (36.9 C) (12/01 0349) Pulse Rate:  [67-84] 67 (12/01 0350) Cardiac Rhythm:  [-] Normal sinus rhythm (12/01 0350) Resp:  [16-20] 16 (12/01 0350) BP: (98-130)/(69-94) 127/94 mmHg (12/01 0350) SpO2:  [92 %-98 %] 93 % (12/01 0350)  Filed Weights   10/02/15 0624 10/02/15 1459  Weight: 93 lb (42.185 kg) 92 lb 15.8 oz (42.18 kg)    Weight change:    Hemodynamic parameters for last 24 hours:    Intake/Output from previous day: 11/30 0701 - 12/01 0700 In: 730 [P.O.:360; I.V.:370] Out: 3390 [Urine:3350; Chest Tube:40]  Intake/Output this shift:    Current Meds: Scheduled Meds: . acetaminophen  1,000 mg Oral 4 times per day   Or  . acetaminophen (TYLENOL) oral liquid 160 mg/5 mL  1,000 mg Oral 4 times per day  . bisacodyl  10 mg Oral Daily  . fentaNYL   Intravenous 6 times per day  . pantoprazole  40 mg Oral Daily  . senna-docusate  1 tablet Oral QHS   Continuous Infusions: . dextrose 5 % and 0.9 % NaCl with KCl 20 mEq/L 10 mL/hr at 10/05/15 0600   PRN Meds:.diphenhydrAMINE **OR** diphenhydrAMINE, naloxone **AND** sodium chloride, ondansetron (ZOFRAN) IV, ondansetron (ZOFRAN) IV, oxyCODONE, pneumococcal 23 valent vaccine, potassium chloride, traMADol  General appearance: alert and cooperative Neurologic: intact Heart: regular rate and rhythm, S1, S2 normal, no murmur, click, rub or gallop Lungs: diminished breath sounds bibasilar Abdomen: soft,  non-tender; bowel sounds normal; no masses,  no organomegaly Extremities: extremities normal, atraumatic, no cyanosis or edema and Homans sign is negative, no sign of DVT Wound: no air leak  Lab Results: CBC: Recent Labs  10/03/15 0323 10/04/15 0520  WBC 9.9 5.9  HGB 11.8* 11.7*  HCT 35.3* 36.8*  PLT 184 214   BMET:  Recent Labs  10/03/15 0323 10/04/15 0520  NA 133* 136  K 4.8 4.6  CL 97* 99*  CO2 31 32  GLUCOSE 158* 90  BUN 8 6  CREATININE 0.84 0.88  CALCIUM 7.7* 7.9*    PT/INR: No results for input(s): LABPROT, INR in the last 72 hours. Radiology: Dg Chest Port 1 View  10/05/2015  CLINICAL DATA:  Empyema EXAM: PORTABLE CHEST 1 VIEW COMPARISON:  Yesterday FINDINGS: Small (less than 10%) but increased loculated pneumothorax along the lateral left chest where there is chest tube in similar position. Loculated pleural fluid or thickening at this level is also stable. Stable heart size and mediastinal contours, including massive enlargement of the main pulmonary artery. Calcified pleural plaques. IMPRESSION: Increased but small loculated pneumothorax along the left chest wall. Stable left pleural fluid/thickening. Electronically Signed   By: Monte Fantasia M.D.   On: 10/05/2015 07:28   Recent Results (from the past 240 hour(s))  Tissue culture     Status:  None   Collection Time: 09/27/15  4:00 PM  Result Value Ref Range Status   Specimen Description TISSUE LEFT PLEURAL  Final   Special Requests NONE  Final   Gram Stain   Final    NO WBC SEEN NO SQUAMOUS EPITHELIAL CELLS SEEN NO ORGANISMS SEEN Performed at [facility]    Culture   Final    NO GROWTH 3 DAYS Performed at [facility]    Report Status 10/01/2015 FINAL  Final  OVA + PARASITE EXAM     Status: None   Collection Time: 09/29/15  9:49 AM  Result Value Ref Range Status   OVA + PARASITE EXAM Final report  Final    Comment: (NOTE) These results were obtained using wet preparation(s) and  trichrome stained smear. This test does not include testing for Cryptosporidium parvum, Cyclospora, or Microsporidia. Performed At: [facility] [place], [place] [id number] Elwanda Brooklyn R MD [id number]    Source of Sample NONE  Final  Surgical pcr screen     Status: None   Collection Time: 10/02/15  6:48 AM  Result Value Ref Range Status   MRSA, PCR NEGATIVE NEGATIVE Final   Staphylococcus aureus NEGATIVE NEGATIVE Final    Comment:        The Xpert SA Assay (FDA approved for NASAL specimens in patients over 49 years of age), is one component of a comprehensive surveillance program.  Test performance has been validated by [facility] for patients greater than or equal to 30 year old. It is not intended to diagnose infection nor to guide or monitor treatment.   AFB culture with smear     Status: None (Preliminary result)   Collection Time: 10/02/15  7:47 AM  Result Value Ref Range Status   Specimen Description BRONCHIAL WASHINGS LEFT  Final   Special Requests SPEC A  Final   Acid Fast Smear   Final    NO ACID FAST BACILLI SEEN Performed at [facility]    Culture   Final    CULTURE WILL BE EXAMINED FOR 6 WEEKS BEFORE ISSUING A FINAL REPORT Performed at [facility]    Report Status PENDING  Incomplete  Culture, respiratory (NON-Expectorated)     Status: None (Preliminary result)   Collection Time: 10/02/15  7:47 AM  Result Value Ref Range Status   Specimen Description BRONCHIAL WASHINGS LEFT  Final   Special Requests SPEC A  Final   Gram Stain   Final    NO WBC SEEN NO SQUAMOUS EPITHELIAL CELLS SEEN NO ORGANISMS SEEN Performed at [facility]    Culture   Final    Culture reincubated for better growth Performed at [facility]    Report Status PENDING  Incomplete  Fungus Culture with Smear     Status: None (Preliminary result)   Collection Time: 10/02/15  8:39 AM  Result Value Ref Range Status    Specimen Description FLUID PLEURAL LEFT  Final   Special Requests SPEC B  Final   Fungal Smear   Final    NO YEAST OR FUNGAL ELEMENTS SEEN Performed at [facility]    Culture   Final    CULTURE IN PROGRESS FOR FOUR WEEKS Performed at [facility]    Report Status PENDING  Incomplete  AFB culture with smear     Status: None (Preliminary result)   Collection Time: 10/02/15  8:39 AM  Result Value Ref Range Status  Specimen Description FLUID PLEURAL LEFT  Final   Special Requests SPEC B  Final   Acid Fast Smear   Final    NO ACID FAST BACILLI SEEN Performed at [facility]    Culture   Final    CULTURE WILL BE EXAMINED FOR 6 WEEKS BEFORE ISSUING A FINAL REPORT Performed at [facility]    Report Status PENDING  Incomplete  Culture, body fluid-bottle     Status: None (Preliminary result)   Collection Time: 10/02/15  8:39 AM  Result Value Ref Range Status   Specimen Description FLUID LEFT PLEURAL  Final   Special Requests BOTTLES DRAWN AEROBIC AND ANAEROBIC 5CCS  Final   Culture NO GROWTH 2 DAYS  Final   Report Status PENDING  Incomplete  Gram stain     Status: None   Collection Time: 10/02/15  8:39 AM  Result Value Ref Range Status   Specimen Description FLUID LEFT PLEURAL  Final   Special Requests NONE  Final   Gram Stain   Final    DEGENERATED CELLULAR MATERIAL PRESENT NO ORGANISMS SEEN    Report Status 10/02/2015 FINAL  Final  Anaerobic culture     Status: None (Preliminary result)   Collection Time: 10/02/15  8:50 AM  Result Value Ref Range Status   Specimen Description FLUID PLEURAL LEFT  Final   Special Requests SPEC C ON SWAB  Final   Gram Stain   Final    NO ANAEROBES ISOLATED; CULTURE IN PROGRESS FOR 5 DAYS   Culture PENDING  Incomplete   Report Status PENDING  Incomplete  Body fluid culture     Status: None (Preliminary result)   Collection Time: 10/02/15  8:50 AM  Result Value Ref Range Status   Specimen Description FLUID  PLEURAL LEFT  Final   Special Requests SPEC C ON SWAB  Final   Gram Stain   Final    MODERATE WBC PRESENT,BOTH PMN AND MONONUCLEAR NO ORGANISMS SEEN    Culture NO GROWTH 2 DAYS  Final   Report Status PENDING  Incomplete  Anaerobic culture     Status: None (Preliminary result)   Collection Time: 10/02/15  8:53 AM  Result Value Ref Range Status   Specimen Description TISSUE LUNG LEFT  Final   Special Requests SPEC D  Final   Gram Stain   Final    NO WBC SEEN NO ORGANISMS SEEN Performed at [facility]    Culture   Final    NO ANAEROBES ISOLATED; CULTURE IN PROGRESS FOR 5 DAYS Performed at [facility]    Report Status PENDING  Incomplete  Tissue culture     Status: None (Preliminary result)   Collection Time: 10/02/15  8:53 AM  Result Value Ref Range Status   Specimen Description TISSUE LUNG LEFT  Final   Special Requests SPEC D PER ALLYSA HEGARTY UNABLE TO MODIFY IN EPIC  Final   Gram Stain   Final    NO WBC SEEN NO ORGANISMS SEEN Performed at [facility]    Culture   Final    NO GROWTH 2 DAYS Performed at [facility]    Report Status PENDING  Incomplete     Assessment/Plan: S/P Procedure(s) (LRB): VIDEO ASSISTED THORACOSCOPY (VATS)/DECORTICATION and drainage of chronic empyena (Left) VIDEO BRONCHOSCOPY (N/A) i ncreased but small loculated pneumothorax along the left chest wall, no ait leak this am, will leave chest tube one more day, poss remove inm and home sat  So far cultures and aifb smears negative   [name] 10/05/2015 8:01 AM

## 2015-10-05 NOTE — Discharge Summary (Signed)
Physician Discharge Summary       [address]       [place] [address]             [phone]    Patient ID: [name] [name] MRN: [id number] DOB/AGE: 53-30-1962 53 y.o.  Admit date: 10/02/2015 Discharge date: 10/20/2015  Admission Diagnosis: Left pleural effusion  Additonal Diagnoses:  1.Left chronic empyema ([place]) 2. S/p left thoracotomy, drain empyema 2003  Consult: Dr. Johnnye Sima from infectious disease and Interventional radiology  Procedure (s):  1. Video bronchoscopy, left video-assisted thoracoscopy, mini thoracotomy, drainage of empyema with decortication by Dr. Marcelino Scot on 10/02/2015. 2. 12 French pigtail catheter placed on 10/17/2015 by IR  History of Presenting Illness: Patient is a 54 y.o. male who presented to the emergency department last week with c/o abdominal pain, diarrhea and increasing dyspnea. 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 Over the past approximate 3 weeks there has been notable increased dyspnea and decreased exercise tolerance which his nephew described as "like asthma". He has had chest discomfort with exertion with possible esophageal spasm Symptoms. He is having bowel movements. He presented to urgent care where a chest x-ray showed a large left pleural effusion and the patient was referred to the emergency department. The patient had a CT angiogram which revealed the following:  IMPRESSION: 1. No evidence of pulmonary embolus. 2. Loculated complex left-sided pleural effusion has increased mildly in size, with minimal air now seen in the collection, raising concern for superinfection or evolving empyema. Would correlate clinically. 3. Dilatation of the main pulmonary artery, measuring 4.8 cm in diameter, raising question for underlying pulmonary arterial hypertension. 4. Scattered calcified pleural plaques  raise question for underlying prior asbestos exposure. Mild scarring at the right lung base. 5. Mild emphysematous change at the upper lung lobes.  He was admitted for further evaluation and treatment . the patient does not have a leukocytosis. He does not have a productive cough. He is afebrile and hemodynamically stable although at times hypertensive. Oxygen saturations are greater than 90 on room air. He is currently on 2 L nasal cannula with saturations at 99-100%. He does have some intermittent diaphoresis, but no chills of fevers  CT guided biopsy attempt drainage of left chest fluid showed fibrious tissue  In 2003, Dr. Servando Snare did limited decortication left lower chest. Current findings not present on films several years ago. He has been evaluated for Tb in past .  Chronic loculated left pleural effusion. There is possibility this could be evolving to an empyema but appears generally well and not infected. He has evidence of pulmonary HTN on CTA and LVH and ? Left Atrial enlargement n EKG so echocardiogram may be indicated as diastolic dysfunction may be a contributor to his dyspnea. The goals risks and alternatives of the planned surgical procedure bronchoscopy and left VATShave been discussed with the patient in detail. Potential risks, benefits, and complications were discussed with the patient and he agreed to proceed with surgery. He underwent a left VATS, mini thoracotomy, drain empyema, and decortication on 10/02/2015.  Brief Hospital Course:  He has remained afebrile and hemodynamically stable. A line and foley were removed on post operative day one. Daily chest x rays were obtained and remained stable. Chest tube was put to water seal on 11/30. Follow up chest x ray showed increased but small loculated pneumothorax. Chest tube had a  persistent small air leak. Chest tube remained and was eventually removed 12/6. He was put on IV Zinacef post op. Cultures showed no organisms or  growth to date. Bronchial washings showed few H. Influenza. He is ambulating on room air. He is tolerating a diet. He developed loose stools . He has not been on antibiotics for several days. Stool softeners were discontinued and his loose stools stopped. C Dif was negative. He then developed fever (up to 103) and chills. Urinalysis was negative and blood culture was negative. CT of the chest done 12/7 showed recurrent empyema involving chronic loculated left pleural effusion, gas containing tract extends from the loculated left effusion through the anterior and lateral left chest wall. Pleuro cutaneous fistula cannot be excluded. Also, chronic bilateral pleural calcifications, aortic atherosclerosis, and an increase caliber of the pulmonary arteries worrisome for PA Hypertension. An infectious disease consult was obtained. He was put on IV Vanco and Zosyn. Per infectious disease, he needs to remain on IV antibiotics for  a total of 3 weeks. Per Dr. Johnnye Sima, Vancomycin stopped 12/15. Ultimately, IR placed a catheter in the left chest on 12/13. His chest tube was disconnected from [place] and placed to a mini express on 12/16.He is felt surgically stable for discharge today.  Latest Vital Signs: Blood pressure 105/75, pulse 94, temperature 99.1 F (37.3 C), temperature source Oral, resp. rate 18, height 5' (1.524 m), weight 92 lb 15.8 oz (42.18 kg), SpO2 94 %.  Physical Exam: General appearance: alert and cooperative Neurologic: intact Heart: regular rate and rhythm, S1, S2 normal, no murmur, click, rub or gallop Lungs: diminished breath sounds bibasilar Abdomen: soft, non-tender; bowel sounds normal; no masses, no organomegaly Extremities: extremities normal, atraumatic, no cyanosis or edema and Homans sign is negative, no sign of DVT Chest tube in place  Discharge Condition:Stable and discharged to home  Recent laboratory studies:  Lab Results  Component Value Date   WBC 10.1 10/16/2015    HGB 11.0* 10/16/2015   HCT 33.2* 10/16/2015   MCV 86.0 10/16/2015   PLT 406* 10/16/2015   Lab Results  Component Value Date   NA 134* 10/20/2015   K 4.2 10/20/2015   CL 97* 10/20/2015   CO2 30 10/20/2015   CREATININE 1.48* 10/20/2015   GLUCOSE 101* 10/20/2015   Diagnostic Studies:  EXAM: CHEST 2 VIEW  COMPARISON: 10/16/2015 chest radiograph.  FINDINGS: Left pleural catheter tip overlies the lower left lung. Stable cardiomediastinal silhouette with normal heart size. No right pneumothorax. There is a small to moderate lateral/basilar left hydro pneumothorax, which overall appears stable in size, with decreased fluid component and slightly increased pneumothorax component. No mediastinal shift. Persistent pleural thickening throughout the lateral and basilar left pleural space. Calcified pleural plaques overlie the lower lungs bilaterally. No pulmonary edema. Patchy opacity at the left lung base, stable.  IMPRESSION: 1. Overall stable size of small moderate lateral/basilar left hydropneumothorax status post left pleural catheter placement, with decreased fluid component and increased pneumothorax component, suggesting trapped left lung. No mediastinal shift. 2. Stable pleural thickening throughout the lateral/basilar left pleural space. Stable bilateral calcified pleural plaques. 3. Stable patchy opacity at the left lung base, nonspecific.   Electronically Signed  By: Ilona Sorrel M.D.  On: 10/19/2015 12:12  EXAM: CT CHEST WITHOUT CONTRAST  TECHNIQUE: Multidetector CT imaging of the chest was performed following the standard protocol without IV contrast.  COMPARISON: 09/26/2015.  FINDINGS: Mediastinum: Mild cardiac enlargement is noted. There is aortic atherosclerosis identified. The trachea  appears patent and is midline. Unremarkable appearance of the esophagus. No significant mediastinal or hilar adenopathy. The main pulmonary artery  appears increased in diameter measuring 4.6 cm. Findings are worrisome for PA hypertension.  Lungs/Pleura: Bilateral pleural calcifications are again noted. Loculated left pleural fluid collection is again identified. On the current exam there is a fluid level identified within the loculation overlying the posterior lateral left upper lobe. Overlying the left lower lung zone is gas an intermediate attenuation fluid which is worrisome for persistent/recurrent empyema, image number 46 of series 2. A small gas containing tract is identified within the anterior and lateral left chest wall, image 43/series 2. Bilateral areas of bronchial wall thickening identified. Scarring is noted within the right lung base.  Upper Abdomen: No focal liver abnormality identified. Splenic granulomas are noted.  Musculoskeletal: No acute bone abnormality identified.  IMPRESSION: 1. 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 2. Gas containing tract extends from the loculated left effusion through the anterior and lateral left chest wall. Pleuro cutaneous fistula cannot be excluded. 3. Chronic bilateral pleural calcifications. 4. Aortic atherosclerosis 5. Increase caliber of the pulmonary arteries worrisome for PA hypertension.   Electronically Signed  By: Kerby Moors M.D.  On: 10/11/2015 19:54  Discharge Medications:   Medication List    TAKE these medications        oxyCODONE 5 MG immediate release tablet  Commonly known as:  Oxy IR/ROXICODONE  Take 1 tablet (5 mg total) by mouth every 4 (four) hours as needed for severe pain.     piperacillin-tazobactam 3.375 GM/50ML IVPB  Commonly known as:  ZOSYN  Inject 50 mLs (3.375 g total) into the vein every 8 (eight) hours. Last dose is to be give on 11/07/2015.        Follow Up Appointments: Follow-up Information    Follow up with [name], MD On 10/24/2015.   Specialty:  Cardiothoracic  Surgery   Why:  PA/LAT CXR to be taken (at [facility] which is in the same building as Dr. Everrett [facility]om[facility] office) on 10/24/2015 at 2:00 pm;Appointment time is at 2:45 pm   Contact information:   [address] [place] [address] [phone]       Follow up with [facility].   Why:  Please remove PICC line after last dose of antibiotic (11/07/2015)      Follow up with [facility].   Why:  HH-RN- for IV abx/Picc line care and Chest tube care   Contact information:   [address] [place] [address] [phone]       Signed: Lars Pinks MPA-C 10/20/2015, 12:13 PM

## 2015-10-05 NOTE — Discharge Instructions (Signed)
Thoracotomy, Care After °Refer to this sheet in the next few weeks. These instructions provide you with information on caring for yourself after your procedure. Your health care provider may also give you more specific instructions. Your treatment has been planned according to current medical practices, but problems sometimes occur. Call your health care provider if you have any problems or questions after your procedure. °WHAT TO EXPECT AFTER YOUR PROCEDURE °After your procedure, it is typical to have the following sensations: °· You may feel pain at the incision site. °· You may be constipated from the pain medicine given and the change in your level of activity. °· You may feel extremely tired. °HOME CARE INSTRUCTIONS °· Take over-the-counter or prescription medicines for pain, discomfort, or fever only as directed by your health care provider. It is very important to take pain relieving medicine before your pain becomes severe. You will be able to breathe and cough more comfortably if your pain is well controlled. °· Take deep breaths. Deep breathing helps to keep your lungs inflated and protects against a lung infection (pneumonia). °· Cough frequently. Even though coughing may cause discomfort, coughing is important to clear mucus (phlegm) and expand your lungs. Coughing helps prevent pneumonia. If it hurts to cough, hold a pillow against your chest when you cough. This may help with the discomfort. °· Continue to use an incentive spirometer as directed. The use of an incentive spirometer helps to keep your lungs inflated and protects against pneumonia. °· Change the bandages over your incision as needed or as directed by your health care provider. °· Remove the bandages over your chest tube site as directed by your health care provider. °· Resume your normal diet as directed. It is important to have adequate protein, calories, vitamins, and minerals to promote healing. °· Prevent constipation. °¨ Eat  high-fiber foods such as whole grain cereals and breads, brown rice, beans, and fresh fruits and vegetables. °¨ Drink enough water and fluids to keep your urine clear or pale yellow. Avoid drinking beverages containing caffeine. Beverages containing caffeine can cause dehydration and harden your stool. °¨ Talk to your health care provider about taking a stool softener or laxative. °· Avoid lifting until you are instructed otherwise. °· Do not drive until directed by your health care provider.  Do not drive while taking pain medicines (narcotics). °· Do not bathe, swim, or use a hot tub until directed by your health care provider. You may shower instead. Gently wash the area of your incision with water and soap as directed. Do not use anything else to clean your incision except as directed by your health care provider. °· Do not use any tobacco products including cigarettes, chewing tobacco, or electronic cigarettes. °· Avoid secondhand smoke. °· Schedule an appointment for stitch (suture) or staple removal as directed. °· Schedule and attend all follow-up visits as directed by your health care provider. It is important to keep all your appointments. °· Participate in pulmonary rehabilitation as directed by your health care provider. °· Do not travel by airplane for 2 weeks after your chest tube is removed. °SEEK MEDICAL CARE IF: °· You are bleeding from your wounds. °· Your heartbeat seems irregular. °· You have redness, swelling, or increasing pain in the wounds. °· There is pus coming from your wounds. °· There is a bad smell coming from the wound or dressing. °· You have a fever or chills. °· You have nausea or are vomiting. °· You have muscle aches. °SEEK   IMMEDIATE MEDICAL CARE IF: °· You have a rash. °· You have difficulty breathing. °· You have a reaction or side effect to medicines given. °· You have persistent nausea. °· You have lightheadedness or feel faint. °· You have shortness of breath or chest  pain. °· You have persistent pain. °  °This information is not intended to replace advice given to you by your health care provider. Make sure you discuss any questions you have with your health care provider. °  °Document Released: 04/05/2011 Document Revised: 03/07/2015 Document Reviewed: 06/09/2013 °Elsevier Interactive Patient Education ©2016 [employer] Inc[employer] ° °

## 2015-10-05 NOTE — Care Management Note (Signed)
Case Management Note  Patient Details  Name: [name] [name] MRN: [id number] Date of Birth: [birth year]  Subjective/Objective:                 Admitted with Left plerual effusion,  s/p VIDEO ASSISTED THORACOSCOPY (VATS)/DECORTICATION and drainage of chronic empyena (Left) 10/02/15. VIDEO BRONCHOSCOPY.Pt is [place] however speaks some english. Resides with girlfriend. Independent with ADL's. No DME usage.    Action/Plan: Return to home when medically stable. CM to f/u with disposition needs.  Expected Discharge Date:                  Expected Discharge Plan:  Home/Self Care  In-House Referral:     Discharge planning Services  CM Consult  Post Acute Care Choice:    Choice offered to:     DME Arranged:    DME Agency:     HH Arranged:    HH Agency:     Status of Service:  In process, will continue to follow  Medicare Important Message Given:    Date Medicare IM Given:    Medicare IM give by:    Date Additional Medicare IM Given:    Additional Medicare Important Message give by:     If discussed at [place] of Stay Meetings, dates discussed:    Additional Comments:    Sharin Mons, [place] [phone] 10/05/2015, 9:40 AM

## 2015-10-06 ENCOUNTER — Inpatient Hospital Stay (HOSPITAL_COMMUNITY): Payer: BLUE CROSS/BLUE SHIELD

## 2015-10-06 LAB — TISSUE CULTURE
Culture: NO GROWTH
Gram Stain: NONE SEEN

## 2015-10-06 NOTE — Progress Notes (Signed)
Utilization review completed.  

## 2015-10-06 NOTE — Progress Notes (Addendum)
      [address]       [name] [address]             [phone]       4 Days Post-Op Procedure(s) (LRB): VIDEO ASSISTED THORACOSCOPY (VATS)/DECORTICATION and drainage of chronic empyena (Left) VIDEO BRONCHOSCOPY (N/A)  Subjective: Patient without complaints this am.  Objective: Vital signs in last 24 hours: Temp:  [97.5 F (36.4 C)-98.4 F (36.9 C)] 98.2 F (36.8 C) (12/02 0503) Pulse Rate:  [76-98] 92 (12/02 0503) Cardiac Rhythm:  [-] Normal sinus rhythm (12/02 0700) Resp:  [12-18] 18 (12/02 0757) BP: (117-139)/(77-91) 139/87 mmHg (12/02 0503) SpO2:  [95 %-98 %] 95 % (12/02 0757)     Intake/Output from previous day: 12/01 0701 - 12/02 0700 In: 510 [P.O.:480; I.V.:30] Out: 1310 [Urine:1250; Chest Tube:60]   Physical Exam:  Cardiovascular: RRR Pulmonary: Clear on right and diminished left base. Abdomen: Soft, non tender, bowel sounds present. Wounds: Clean and dry.  No erythema or signs of infection. Chest Tube: + 1 intermittent air leak with cough  Lab Results: CBC: Recent Labs  10/04/15 0520  WBC 5.9  HGB 11.7*  HCT 36.8*  PLT 214   BMET:  Recent Labs  10/04/15 0520  NA 136  K 4.6  CL 99*  CO2 32  GLUCOSE 90  BUN 6  CREATININE 0.88  CALCIUM 7.9*    PT/INR: No results for input(s): LABPROT, INR in the last 72 hours. ABG:  INR: Will add last result for INR, ABG once components are confirmed Will add last 4 CBG results once components are confirmed  Assessment/Plan:  1. CV - SR in the 90's. 2.  Pulmonary - Chest tube with 60 cc last 24 hours. No CXR ordered so will order.Chest tube is to water seal and there is a +1 air leak with cough. Chest tube to remain for now. Check CXR in am. Fluid cultures negative thus far. Bronchial washings showed few H. Influenzae.   ZIMMERMAN,DONIELLE MPA-C 10/06/2015,9:19 AM   Small air leak leave chest tube today Pa lat chest xray in am I have seen and examined [name] [name] and agree with  the above assessment  and plan.  [name] MD Bee[facility] [phone] Office [phone] 10/06/2015 9:49 AM

## 2015-10-07 ENCOUNTER — Inpatient Hospital Stay (HOSPITAL_COMMUNITY): Payer: BLUE CROSS/BLUE SHIELD

## 2015-10-07 LAB — ANAEROBIC CULTURE: Gram Stain: NONE SEEN

## 2015-10-07 LAB — CULTURE, BODY FLUID W GRAM STAIN -BOTTLE: Culture: NO GROWTH

## 2015-10-07 NOTE — Progress Notes (Addendum)
      [address]       [name] [address]             [phone]       5 Days Post-Op Procedure(s) (LRB): VIDEO ASSISTED THORACOSCOPY (VATS)/DECORTICATION and drainage of chronic empyena (Left) VIDEO BRONCHOSCOPY (N/A)  Subjective: Patient without complaints this am.  Objective: Vital signs in last 24 hours: Temp:  [98 F (36.7 C)-98.7 F (37.1 C)] 98.7 F (37.1 C) (12/03 0945) Pulse Rate:  [87-95] 95 (12/03 0945) Cardiac Rhythm:  [-] Normal sinus rhythm (12/02 1920) Resp:  [18-21] 18 (12/03 0945) BP: (105-115)/(63-79) 115/77 mmHg (12/03 0945) SpO2:  [95 %-97 %] 96 % (12/03 0945)     Intake/Output from previous day: 12/02 0701 - 12/03 0700 In: 1080 [P.O.:1080] Out: [place] [Urine:1475; Chest Tube:20]   Physical Exam:  Cardiovascular: RRR Pulmonary: Clear on right and diminished left base. Abdomen: Soft, non tender, bowel sounds present. Wounds: Clean and dry.  No erythema or signs of infection. Chest Tube: + 1  air leak with cough  Lab Results: CBC:No results for input(s): WBC, HGB, HCT, PLT in the last 72 hours. BMET: No results for input(s): NA, K, CL, CO2, GLUCOSE, BUN, CREATININE, CALCIUM in the last 72 hours.  PT/INR: No results for input(s): LABPROT, INR in the last 72 hours. ABG:  INR: Will add last result for INR, ABG once components are confirmed Will add last 4 CBG results once components are confirmed  Assessment/Plan:  1. CV - SR in the 80's. 2.  Pulmonary - Chest tube with 20 cc last 24 hours. Chest tube is to water seal and there is a +1 air leak with cough. CXR appears stable.Chest tube to remain for now. Fluid cultures negative thus far. Bronchial washings showed few H. Influenzae. Check CXR in am   ZIMMERMAN,DONIELLE MPA-C 10/07/2015,10:06 AM    I have seen and examined the patient and agree with the assessment and plan as outlined.  Rexene Alberts, MD 10/07/2015 3:38 PM

## 2015-10-08 ENCOUNTER — Inpatient Hospital Stay (HOSPITAL_COMMUNITY): Payer: BLUE CROSS/BLUE SHIELD

## 2015-10-08 NOTE — Progress Notes (Addendum)
      [address]       [place] [address]             [phone]       6 Days Post-Op Procedure(s) (LRB): VIDEO ASSISTED THORACOSCOPY (VATS)/DECORTICATION and drainage of chronic empyena (Left) VIDEO BRONCHOSCOPY (N/A)  Subjective: Patient without complaints this am.  Objective: Vital signs in last 24 hours: Temp:  [98 F (36.7 C)-98.7 F (37.1 C)] 98.2 F (36.8 C) (12/04 0520) Pulse Rate:  [87-95] 93 (12/04 0520) Cardiac Rhythm:  [-] Normal sinus rhythm (12/03 1900) Resp:  [18-25] 20 (12/04 0800) BP: (115-118)/(75-90) 115/75 mmHg (12/04 0520) SpO2:  [94 %-100 %] 96 % (12/04 0800) FiO2 (%):  [0 %] 0 % (12/03 2142)     Intake/Output from previous day: 12/03 0701 - 12/04 0700 In: 720 [P.O.:720] Out: 245 [Urine:225; Chest Tube:20]   Physical Exam:  Cardiovascular: RRR Pulmonary: Clear on right and diminished left base. Abdomen: Soft, non tender, bowel sounds present. Wounds: Clean and dry.  No erythema or signs of infection. Chest Tube: + 1  air leak with cough  Lab Results: CBC:No results for input(s): WBC, HGB, HCT, PLT in the last 72 hours. BMET: No results for input(s): NA, K, CL, CO2, GLUCOSE, BUN, CREATININE, CALCIUM in the last 72 hours.  PT/INR: No results for input(s): LABPROT, INR in the last 72 hours. ABG:  INR: Will add last result for INR, ABG once components are confirmed Will add last 4 CBG results once components are confirmed  Assessment/Plan:  1. CV - SR in the 80's. 2.  Pulmonary - Chest tube with scant output. Chest tube is to water seal and there remains a +1 air leak with cough. CXR appears stable.Chest tube to remain for now. Fluid cultures negative thus far. Bronchial washings showed few H. Influenzae. Check CXR in am   ZIMMERMAN,DONIELLE MPA-C 10/08/2015,9:17 AM     I have seen and examined the patient and agree with the assessment and plan as outlined.  I do not appreciate an air leak today.  Might be able to  d/c tube 1-2 days.  Rexene Alberts, MD 10/08/2015 1:12 PM

## 2015-10-08 NOTE — Progress Notes (Signed)
Pt ambulated in the hall(561ft). It was well tolerated. Oren Beckmann, RN......................................Marland Kitchen

## 2015-10-09 ENCOUNTER — Inpatient Hospital Stay (HOSPITAL_COMMUNITY): Payer: BLUE CROSS/BLUE SHIELD

## 2015-10-09 NOTE — Progress Notes (Addendum)
      [address]       , [address]             [phone]      7 Days Post-Op Procedure(s) (LRB): VIDEO ASSISTED THORACOSCOPY (VATS)/DECORTICATION and drainage of chronic empyena (Left) VIDEO BRONCHOSCOPY (N/A)   Subjective:  [name] [name] has no complaints.  He states he feels good.  He is ambulating without difficulty. Hopes to be discharged soon  Objective: Vital signs in last 24 hours: Temp:  [97.8 F (36.6 C)-98.5 F (36.9 C)] 97.8 F (36.6 C) (12/05 0449) Pulse Rate:  [79-90] 79 (12/05 0449) Cardiac Rhythm:  [-] Normal sinus rhythm (12/04 1930) Resp:  [18-25] 20 (12/05 0449) BP: (110-132)/(70-92) 119/70 mmHg (12/05 0449) SpO2:  [92 %-99 %] 92 % (12/05 0449)  Intake/Output from previous day: 12/04 0701 - 12/05 0700 In: 480 [P.O.:480] Out: 455 [Urine:425; Chest Tube:30] Intake/Output this shift: Total I/O In: -  Out: 250 [Urine:250]  General appearance: alert, cooperative and no distress Heart: regular rate and rhythm Lungs: diminished breath sounds left base Abdomen: soft, non-tender; bowel sounds normal; no masses,  no organomegaly Wound: clean and dry  Lab Results: No results for input(s): WBC, HGB, HCT, PLT in the last 72 hours. BMET: No results for input(s): NA, K, CL, CO2, GLUCOSE, BUN, CREATININE, CALCIUM in the last 72 hours.  PT/INR: No results for input(s): LABPROT, INR in the last 72 hours. ABG    Component Value Date/Time   PHART 7.331* 10/03/2015 0317   HCO3 31.4* 10/03/2015 0317   TCO2 33.2 10/03/2015 0317   O2SAT 99.3 10/03/2015 0317   CBG (last 3)  No results for input(s): GLUCAP in the last 72 hours.  Assessment/Plan: S/P Procedure(s) (LRB): VIDEO ASSISTED THORACOSCOPY (VATS)/DECORTICATION and drainage of chronic empyena (Left) VIDEO BRONCHOSCOPY (N/A)  1. Chest tube- small 1+ air leak with cough, CXR remains stable with loculated left side hydropneumothorax 2. CV- remains hemodynamically stable 3. Dispo- will  get PA/LAT CXR in AM, leave chest tube to water seal for now   LOS: 7 days    BARRETT, ERIN 10/09/2015  Small air leak with cough I have seen and examined [name] [name] and agree with the above assessment  and plan.  [name] MD Beeper [phone] Office [phone] 10/09/2015 2:30 PM

## 2015-10-09 NOTE — Progress Notes (Signed)
Discontinued PCA pump per MD orders. Wasted in sink witnessed by [employer]. Nehemias Sauceda 9:00 AM

## 2015-10-10 ENCOUNTER — Inpatient Hospital Stay (HOSPITAL_COMMUNITY): Payer: BLUE CROSS/BLUE SHIELD

## 2015-10-10 MED ORDER — ACETAMINOPHEN 500 MG PO TABS
1000.0000 mg | ORAL_TABLET | Freq: Four times a day (QID) | ORAL | Status: DC | PRN
  Administered 2015-10-10 – 2015-10-20 (×11): 1000 mg via ORAL
  Filled 2015-10-10 (×11): qty 2

## 2015-10-10 NOTE — Progress Notes (Signed)
      [address]       [name] [address]             [phone]       8 Days Post-Op Procedure(s) (LRB): VIDEO ASSISTED THORACOSCOPY (VATS)/DECORTICATION and drainage of chronic empyena (Left) VIDEO BRONCHOSCOPY (N/A)  Subjective: Patient with loose stools.  Objective: Vital signs in last 24 hours: Temp:  [98.1 F (36.7 C)-98.5 F (36.9 C)] 98.1 F (36.7 C) (12/06 0614) Pulse Rate:  [82-92] 82 (12/06 0614) Cardiac Rhythm:  [-] Normal sinus rhythm (12/06 0730) Resp:  [20] 20 (12/06 0614) BP: (108-121)/(63-76) 108/72 mmHg (12/06 0614) SpO2:  [97 %-98 %] 97 % (12/06 0614)     Intake/Output from previous day: 12/05 0701 - 12/06 0700 In: 720 [P.O.:720] Out: 290 [Urine:250; Chest Tube:40]   Physical Exam:  Cardiovascular: RRR Pulmonary: Clear on right and diminished left base. Abdomen: Soft, non tender, bowel sounds present. Wounds: Clean and dry.  No erythema or signs of infection. Chest Tube: to water seal and no air leak   Lab Results: CBC:No results for input(s): WBC, HGB, HCT, PLT in the last 72 hours. BMET: No results for input(s): NA, K, CL, CO2, GLUCOSE, BUN, CREATININE, CALCIUM in the last 72 hours.  PT/INR: No results for input(s): LABPROT, INR in the last 72 hours. ABG:  INR: Will add last result for INR, ABG once components are confirmed Will add last 4 CBG results once components are confirmed  Assessment/Plan:  1. CV - SR in the 80's. 2.  Pulmonary - Chest tube with scant output. Chest tube is to water seal and there is no air leak. CXR appears stable (loculated left lateral pneumothorax and left sided pleural thickening, small right pleural effusion). Per Dr. Servando Snare, chest tube to be removed. Fluid cultures negative thus far. Bronchial washings showed few H. Influenzae. Check PA/LAT CXR in am 3. Loose stools. Has been off antibiotics for some time.Per Dr. Servando Snare, check C. Dif.  [name] [name] MPA-C 10/10/2015,8:15 AM

## 2015-10-10 NOTE — Progress Notes (Signed)
Pt chest tube has been removed. Pt tolerated it well. No complaints of pain or shortness of breath. Will continue to monitor.

## 2015-10-10 NOTE — Progress Notes (Signed)
Utilization review completed.  

## 2015-10-11 ENCOUNTER — Inpatient Hospital Stay (HOSPITAL_COMMUNITY): Payer: BLUE CROSS/BLUE SHIELD

## 2015-10-11 LAB — CBC
HCT: 38.9 % — ABNORMAL LOW (ref 39.0–52.0)
Hemoglobin: 12.8 g/dL — ABNORMAL LOW (ref 13.0–17.0)
MCH: 28.5 pg (ref 26.0–34.0)
MCHC: 32.9 g/dL (ref 30.0–36.0)
MCV: 86.6 fL (ref 78.0–100.0)
Platelets: 385 10*3/uL (ref 150–400)
RBC: 4.49 MIL/uL (ref 4.22–5.81)
RDW: 13.2 % (ref 11.5–15.5)
WBC: 14.6 10*3/uL — ABNORMAL HIGH (ref 4.0–10.5)

## 2015-10-11 LAB — BASIC METABOLIC PANEL
Anion gap: 8 (ref 5–15)
BUN: 11 mg/dL (ref 6–20)
CO2: 28 mmol/L (ref 22–32)
Calcium: 8.6 mg/dL — ABNORMAL LOW (ref 8.9–10.3)
Chloride: 98 mmol/L — ABNORMAL LOW (ref 101–111)
Creatinine, Ser: 0.9 mg/dL (ref 0.61–1.24)
GFR calc Af Amer: >60 mL/min (ref >60)
GFR calc non Af Amer: >60 mL/min (ref >60)
Glucose, Bld: 134 mg/dL — ABNORMAL HIGH (ref 65–99)
Potassium: 4 mmol/L (ref 3.5–5.1)
Sodium: 134 mmol/L — ABNORMAL LOW (ref 135–145)

## 2015-10-11 LAB — EXPECTORATED SPUTUM ASSESSMENT W GRAM STAIN, RFLX TO RESP C

## 2015-10-11 LAB — URINALYSIS, ROUTINE W REFLEX MICROSCOPIC
BILIRUBIN URINE: NEGATIVE
GLUCOSE, UA: NEGATIVE mg/dL
HGB URINE DIPSTICK: NEGATIVE
KETONES UR: NEGATIVE mg/dL
LEUKOCYTES UA: NEGATIVE
Nitrite: NEGATIVE
PH: 5.5 (ref 5.0–8.0)
PROTEIN: NEGATIVE mg/dL
Specific Gravity, Urine: 1.022 (ref 1.005–1.030)

## 2015-10-11 LAB — EXPECTORATED SPUTUM ASSESSMENT W REFEX TO RESP CULTURE

## 2015-10-11 NOTE — Progress Notes (Addendum)
Pt very hot to the touch and having chills. Rectal temperature found to be 103. PRN Tylenol given.  Will continue to monitor.   Earlie Lou

## 2015-10-11 NOTE — Progress Notes (Signed)
CT resulted showing the following: FINDINGS: Mediastinum: Mild cardiac enlargement is noted. There is aortic atherosclerosis identified. The trachea appears patent and is midline. Unremarkable appearance of the esophagus. No significant mediastinal or hilar adenopathy. The main pulmonary artery appears increased in diameter measuring 4.6 cm. Findings are worrisome for PA hypertension.  Lungs/Pleura: Bilateral pleural calcifications are again noted. Loculated left pleural fluid collection is again identified. On the current exam there is a fluid level identified within the loculation overlying the posterior lateral left upper lobe. Overlying the left lower lung zone is gas an intermediate attenuation fluid which is worrisome for persistent/recurrent empyema, image number 46 of series 2. A small gas containing tract is identified within the anterior and lateral left chest wall, image 43/series 2. Bilateral areas of bronchial wall thickening identified. Scarring is noted within the right lung base.  Upper Abdomen: No focal liver abnormality identified. Splenic granulomas are noted.  Musculoskeletal: No acute bone abnormality identified.  IMPRESSION: 1. 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 2. Gas containing tract extends from the loculated left effusion through the anterior and lateral left chest wall. Pleuro cutaneous fistula cannot be excluded. 3. Chronic bilateral pleural calcifications. 4. Aortic atherosclerosis 5. Increase caliber of the pulmonary arteries worrisome for PA Hypertension.    RN notified Dr. Cyndia Bent (on call MD) concerning these findings and informed him of his continued increase in temperature. Pt other VSS and in no apparent distress. No new orders given.   Will continue to monitor.   [name] [name]

## 2015-10-11 NOTE — Progress Notes (Addendum)
      [address]       [name] [address]             [phone]      9 Days Post-Op Procedure(s) (LRB): VIDEO ASSISTED THORACOSCOPY (VATS)/DECORTICATION and drainage of chronic empyena (Left) VIDEO BRONCHOSCOPY (N/A)   Subjective:  Mr. [name] [name] has no complaints this morning.  His diarrhea has resolved and his wants to go home.  Developed fever as high as 103 overnight.  Objective: Vital signs in last 24 hours: Temp:  [99.4 F (37.4 C)-103.1 F (39.5 C)] 99.4 F (37.4 C) (12/07 0501) Pulse Rate:  [96-109] 96 (12/07 0501) Cardiac Rhythm:  [-] Normal sinus rhythm;Sinus tachycardia (12/07 0801) Resp:  [20-24] 20 (12/07 0501) BP: (101-132)/(67-78) 101/67 mmHg (12/07 0501) SpO2:  [95 %-97 %] 95 % (12/07 0501)  Intake/Output from previous day: 12/06 0701 - 12/07 0700 In: 480 [P.O.:480] Out: -   General appearance: alert, cooperative and no distress Heart: regular rate and rhythm Lungs: diminished breath sounds bibasilar Abdomen: soft, non-tender; bowel sounds normal; no masses,  no organomegaly Extremities: extremities normal, atraumatic, no cyanosis or edema Wound: clean and dry  Lab Results: No results for input(s): WBC, HGB, HCT, PLT in the last 72 hours. BMET: No results for input(s): NA, K, CL, CO2, GLUCOSE, BUN, CREATININE, CALCIUM in the last 72 hours.  PT/INR: No results for input(s): LABPROT, INR in the last 72 hours. ABG    Component Value Date/Time   PHART 7.331* 10/03/2015 0317   HCO3 31.4* 10/03/2015 0317   TCO2 33.2 10/03/2015 0317   O2SAT 99.3 10/03/2015 0317   CBG (last 3)  No results for input(s): GLUCAP in the last 72 hours.  Assessment/Plan: S/P Procedure(s) (LRB): VIDEO ASSISTED THORACOSCOPY (VATS)/DECORTICATION and drainage of chronic empyena (Left) VIDEO BRONCHOSCOPY (N/A)  1. CV- NSR, BP controlled 2. Pulm- no acute issues, off oxygen, continue IS.... CXR with stable appearance of hydropneumothorax, + pleural scarring 3.  GI- loose stools resolved, likely related to laxative use 4. ID- febrile last night, up to 103... UA negative, Sputum/Blood cultures pending  4. Dispo- patient stable, looks good, no further diarrhea, wants to go home, however with new fever, ? Source... Will discuss with Dr. Newell Coral   LOS: 9 days    [name] [name] 10/11/2015  Abdomen sodt not tender no further stools, cdiff never sent With recurrent fever will get ct of chest  I have seen and examined [name] [name] and agree with the above assessment  and plan.  [name] Isaac MD Beeper [phone] Office [phone] 10/11/2015 6:07 PM

## 2015-10-12 ENCOUNTER — Inpatient Hospital Stay (HOSPITAL_COMMUNITY): Payer: BLUE CROSS/BLUE SHIELD

## 2015-10-12 DIAGNOSIS — J869 Pyothorax without fistula: Principal | ICD-10-CM

## 2015-10-12 DIAGNOSIS — R509 Fever, unspecified: Secondary | ICD-10-CM

## 2015-10-12 MED ORDER — PIPERACILLIN-TAZOBACTAM 3.375 G IVPB
3.3750 g | Freq: Three times a day (TID) | INTRAVENOUS | Status: DC
Start: 2015-10-12 — End: 2015-10-20
  Administered 2015-10-13 – 2015-10-20 (×23): 3.375 g via INTRAVENOUS
  Filled 2015-10-12 (×26): qty 50

## 2015-10-12 MED ORDER — VANCOMYCIN HCL IN DEXTROSE 750-5 MG/150ML-% IV SOLN
750.0000 mg | Freq: Once | INTRAVENOUS | Status: DC
  Filled 2015-10-12: qty 150

## 2015-10-12 MED ORDER — VANCOMYCIN HCL IN DEXTROSE 1-5 GM/200ML-% IV SOLN
1000.0000 mg | INTRAVENOUS | Status: DC
  Administered 2015-10-12 – 2015-10-16 (×5): 1000 mg via INTRAVENOUS
  Filled 2015-10-12 (×6): qty 200

## 2015-10-12 MED ORDER — PIPERACILLIN-TAZOBACTAM 3.375 G IVPB 30 MIN
3.3750 g | Freq: Once | INTRAVENOUS | Status: AC
  Administered 2015-10-12: 3.375 g via INTRAVENOUS
  Filled 2015-10-12: qty 50

## 2015-10-12 NOTE — Progress Notes (Signed)
ANTIBIOTIC CONSULT NOTE - INITIAL  Pharmacy Consult for zosyn/vancomycin Indication: empyema  No Known Allergies  Patient Measurements: Height: 5' (152.4 cm) Weight: 92 lb 15.8 oz (42.18 kg) IBW/kg (Calculated) : 50  Vital Signs: Temp: 98 F (36.7 C) (12/08 0529) Temp Source: Oral (12/08 0529) BP: 118/71 mmHg (12/08 0529) Pulse Rate: 92 (12/08 0529) Intake/Output from previous day: 12/07 0701 - 12/08 0700 In: 600 [P.O.:600] Out: -  Intake/Output from this shift:    Labs:  Recent Labs  10/11/15 1044  WBC 14.6*  HGB 12.8*  PLT 385  CREATININE 0.90   Estimated Creatinine Clearance: 56.7 mL/min (by C-G formula based on Cr of 0.9). No results for input(s): VANCOTROUGH, VANCOPEAK, VANCORANDOM, GENTTROUGH, GENTPEAK, GENTRANDOM, TOBRATROUGH, TOBRAPEAK, TOBRARND, AMIKACINPEAK, AMIKACINTROU, AMIKACIN in the last 72 hours.   Microbiology: Recent Results (from the past 720 hour(s))  Tissue culture     Status: None   Collection Time: 09/27/15  4:00 PM  Result Value Ref Range Status   Specimen Description TISSUE LEFT PLEURAL  Final   Special Requests NONE  Final   Gram Stain   Final    NO WBC SEEN NO SQUAMOUS EPITHELIAL CELLS SEEN NO ORGANISMS SEEN Performed at [facility]    Culture   Final    NO GROWTH 3 DAYS Performed at [facility]    Report Status 10/01/2015 FINAL  Final  OVA + PARASITE EXAM     Status: None   Collection Time: 09/29/15  9:49 AM  Result Value Ref Range Status   OVA + PARASITE EXAM Final report  Final    Comment: (NOTE) These results were obtained using wet preparation(s) and trichrome stained smear. This test does not include testing for Cryptosporidium parvum, Cyclospora, or Microsporidia. Performed At: [place], [place] [id number] Elwanda Brooklyn R MD [id number]    Source of Sample NONE  Final  Surgical pcr screen     Status: None   Collection Time: 10/02/15  6:48 AM  Result Value Ref  Range Status   MRSA, PCR NEGATIVE NEGATIVE Final   Staphylococcus aureus NEGATIVE NEGATIVE Final    Comment:        The Xpert SA Assay (FDA approved for NASAL specimens in patients over 21 years of age), is one component of a comprehensive surveillance program.  Test performance has been validated by [facility] for patients greater than or equal to 51 year old. It is not intended to diagnose infection nor to guide or monitor treatment.   AFB culture with smear     Status: None (Preliminary result)   Collection Time: 10/02/15  7:47 AM  Result Value Ref Range Status   Specimen Description BRONCHIAL WASHINGS LEFT  Final   Special Requests SPEC A  Final   Acid Fast Smear   Final    NO ACID FAST BACILLI SEEN Performed at [facility]    Culture   Final    CULTURE WILL BE EXAMINED FOR 6 WEEKS BEFORE ISSUING A FINAL REPORT Performed at [facility]    Report Status PENDING  Incomplete  Culture, respiratory (NON-Expectorated)     Status: None   Collection Time: 10/02/15  7:47 AM  Result Value Ref Range Status   Specimen Description BRONCHIAL WASHINGS LEFT  Final   Special Requests SPEC A  Final   Gram Stain   Final    NO WBC SEEN NO SQUAMOUS EPITHELIAL CELLS SEEN NO ORGANISMS SEEN Performed at [facility]    Culture   Final    FEW HAEMOPHILUS INFLUENZAE Note: BETA LACTAMASE POSITIVE Performed at [facility]    Report Status 10/05/2015 FINAL  Final  Fungus Culture with Smear     Status: None (Preliminary result)   Collection Time: 10/02/15  8:39 AM  Result Value Ref Range Status   Specimen Description FLUID PLEURAL LEFT  Final   Special Requests SPEC B  Final   Fungal Smear   Final    NO YEAST OR FUNGAL ELEMENTS SEEN Performed at [facility]    Culture   Final    CULTURE IN PROGRESS FOR FOUR WEEKS Performed at [facility]    Report Status PENDING  Incomplete  AFB culture with smear     Status: None (Preliminary  result)   Collection Time: 10/02/15  8:39 AM  Result Value Ref Range Status   Specimen Description FLUID PLEURAL LEFT  Final   Special Requests SPEC B  Final   Acid Fast Smear   Final    NO ACID FAST BACILLI SEEN Performed at [facility]    Culture   Final    CULTURE WILL BE EXAMINED FOR 6 WEEKS BEFORE ISSUING A FINAL REPORT Performed at [facility]    Report Status PENDING  Incomplete  Culture, body fluid-bottle     Status: None   Collection Time: 10/02/15  8:39 AM  Result Value Ref Range Status   Specimen Description FLUID LEFT PLEURAL  Final   Special Requests BOTTLES DRAWN AEROBIC AND ANAEROBIC 5CCS  Final   Culture NO GROWTH 5 DAYS  Final   Report Status 10/07/2015 FINAL  Final  Gram stain     Status: None   Collection Time: 10/02/15  8:39 AM  Result Value Ref Range Status   Specimen Description FLUID LEFT PLEURAL  Final   Special Requests NONE  Final   Gram Stain   Final    DEGENERATED CELLULAR MATERIAL PRESENT NO ORGANISMS SEEN    Report Status 10/02/2015 FINAL  Final  Anaerobic culture     Status: None   Collection Time: 10/02/15  8:50 AM  Result Value Ref Range Status   Specimen Description FLUID PLEURAL LEFT  Final   Special Requests SPEC C ON SWAB  Final   Gram Stain   Final    MODERATE WBC PRESENT,BOTH PMN AND MONONUCLEAR NO ORGANISMS SEEN    Culture   Final    NO ANAEROBES ISOLATED; CULTURE IN PROGRESS FOR 5 DAYS   Report Status 10/07/2015 FINAL  Final  Body fluid culture     Status: None   Collection Time: 10/02/15  8:50 AM  Result Value Ref Range Status   Specimen Description FLUID PLEURAL LEFT  Final   Special Requests SPEC C ON SWAB  Final   Gram Stain   Final    MODERATE WBC PRESENT,BOTH PMN AND MONONUCLEAR NO ORGANISMS SEEN    Culture NO GROWTH 3 DAYS  Final   Report Status 10/05/2015 FINAL  Final  Anaerobic culture     Status: None   Collection Time: 10/02/15  8:53 AM  Result Value Ref Range Status   Specimen Description  TISSUE LUNG LEFT  Final   Special Requests SPEC D  Final   Gram Stain   Final    NO WBC SEEN NO ORGANISMS SEEN Performed at [facility]    Culture   Final    NO ANAEROBES ISOLATED Performed at  [facility]    Report Status 10/07/2015 FINAL  Final  Tissue culture     Status: None   Collection Time: 10/02/15  8:53 AM  Result Value Ref Range Status   Specimen Description TISSUE LUNG LEFT  Final   Special Requests SPEC D PER ALLYSA HEGARTY UNABLE TO MODIFY IN EPIC  Final   Gram Stain   Final    NO WBC SEEN NO ORGANISMS SEEN Performed at [facility]    Culture   Final    NO GROWTH 3 DAYS Performed at [facility]    Report Status 10/06/2015 FINAL  Final  Culture, blood (routine x 2)     Status: None (Preliminary result)   Collection Time: 10/10/15 10:54 PM  Result Value Ref Range Status   Specimen Description BLOOD RIGHT ANTECUBITAL  Final   Special Requests   Final    BOTTLES DRAWN AEROBIC AND ANAEROBIC 6CC BLUE 5CC PURPLE   Culture NO GROWTH < 24 HOURS  Final   Report Status PENDING  Incomplete  Culture, blood (routine x 2)     Status: None (Preliminary result)   Collection Time: 10/10/15 11:01 PM  Result Value Ref Range Status   Specimen Description BLOOD LEFT ANTECUBITAL  Final   Special Requests   Final    BOTTLES DRAWN AEROBIC AND ANAEROBIC 6CC BLUE 5CC PURPLE   Culture NO GROWTH < 24 HOURS  Final   Report Status PENDING  Incomplete  Culture, expectorated sputum-assessment     Status: None   Collection Time: 10/11/15  4:14 AM  Result Value Ref Range Status   Specimen Description EXPECTORATED SPUTUM  Final   Special Requests NONE  Final   Sputum evaluation   Final    THIS SPECIMEN IS ACCEPTABLE. RESPIRATORY CULTURE REPORT TO FOLLOW.   Report Status 10/11/2015 FINAL  Final  Culture, respiratory (NON-Expectorated)     Status: None (Preliminary result)   Collection Time: 10/11/15  4:14 AM  Result Value Ref Range Status   Specimen  Description EXPECTORATED SPUTUM  Final   Special Requests NONE  Final   Gram Stain PENDING  Incomplete   Culture   Final    Culture reincubated for better growth Performed at [facility]    Report Status PENDING  Incomplete    Medical History: Past Medical History  Diagnosis Date  . Kidney stones   . Hypertension     Medications:  Scheduled:  . pantoprazole  40 mg Oral Daily  . piperacillin-tazobactam  3.375 g Intravenous Once  . vancomycin  750 mg Intravenous Once     Assessment: 54 yo male s/p VATS, decorticaion, drainage of empyema on 11-28 with fever to begin vancomycin and zosyn for empyema. WBC= 14.6, tmax= 103, SCr= 0.9 and CrCl ~ 55.  12/8 vanc 12/8 zosyn  12/7 resp 12/6 blood x2   Goal of Therapy:  Vancomycin trough level 15-20 mcg/ml  Plan:  -Zosyn 3.375gm IV q8h -Vancomycin 1000mg  IV q24h -Will follow renal function, cultures and clinical progress   Hildred Laser, Pharm D 10/12/2015 12:02 PM

## 2015-10-12 NOTE — Progress Notes (Addendum)
      [address]       [place] [address]             [phone]       10 Days Post-Op Procedure(s) (LRB): VIDEO ASSISTED THORACOSCOPY (VATS)/DECORTICATION and drainage of chronic empyena (Left) VIDEO BRONCHOSCOPY (N/A)  Subjective: Patient with fever, shaking chills, and abdominal discomfort.  Objective: Vital signs in last 24 hours: Temp:  [98 F (36.7 C)-103 F (39.4 C)] 98 F (36.7 C) (12/08 0529) Pulse Rate:  [92-104] 92 (12/08 0529) Cardiac Rhythm:  [-] Sinus tachycardia (12/07 2027) Resp:  [18] 18 (12/08 0529) BP: (118-148)/(71-85) 118/71 mmHg (12/08 0529) SpO2:  [97 %-100 %] 97 % (12/08 0529)     Intake/Output from previous day: 12/07 0701 - 12/08 0700 In: 600 [P.O.:600] Out: -    Physical Exam:  Cardiovascular: RRR Pulmonary: Clear on right and diminished left base. Abdomen: Soft, non tender, bowel sounds present. Wounds: Clean and dry.  No erythema or signs of infection.   Lab Results: CBC:  Recent Labs  10/11/15 1044  WBC 14.6*  HGB 12.8*  HCT 38.9*  PLT 385   BMET:   Recent Labs  10/11/15 1044  NA 134*  K 4.0  CL 98*  CO2 28  GLUCOSE 134*  BUN 11  CREATININE 0.90  CALCIUM 8.6*    PT/INR: No results for input(s): LABPROT, INR in the last 72 hours. ABG:  INR: Will add last result for INR, ABG once components are confirmed Will add last 4 CBG results once components are confirmed  Assessment/Plan:  1. CV - SR in the 80's. 2.  Pulmonary - CT chest showed main pulmonary artery increased in diameter (possible PA hypertension), loculated left pleural fluid collection/persistent empyema, ? pleuro cutaneous fistula. CXR this am appears stable (left hydro pneumothorax, small right pleural effusion). 3. Fever to 103 last evening. WBC increased to 14,600 yesterday.  UA negative. Await respiratory culture.Cultures thus far negative. Will consult infectious disease.  [name] [name] MPA-C 10/12/2015,7:31 AM

## 2015-10-12 NOTE — Consult Note (Signed)
[facility] for Infectious Disease  Date of Admission:  10/02/2015  Date of Consult:  10/12/2015  Reason for Consult: Fever, empyema Referring Physician: Georgetta Haber  Impression/Recommendation Fever  Empyema  Will check plain films of abd Will start vanco/zosyn Would consider repeat sampling of empyema fluid  His exam does not suggest a source.  He has some complaints of abd tenderness, none on exam.  His current fluid, empyema may not be the same as his original presenting issue.  I've asked path to send his tissue to [facility] for PCR for bacteria, AFB, fungus. Hopefully this will be helpful   Thank you so much for this interesting consult,   Bobby Rumpf (pager) [phone] [email or web address]  [name] [name] is an 54 y.o. male.  HPI: 54 yo M from [place] in [place] since 1986, with hx of decortication L chest 2003 for prior empyema. He was eval for TB at that time, negative.  He returns on 11-28 with worsening SOB, DOE over 3 weeks. He was found on CT chest to have loculated L pleural effusion. He was afebrile, non-hypoxic, and had a normal WBC. He underwent VATS, decorticaion, drainage of empyema on 11-28. Body fluid and tissue Cx were both (-). His BAL Cx grew a few H influenza. His afb stains were (-) x 2, Cx pending. Fungal smear (-), Cx pending. Path did not show organisms or malignancy. He was given zinacef peri-operatively. He did well post-operatively with the exception of a small air leak/pnuemothorax around his chest tube.  He also developed diarrhea transiently, which resolved by 12-7. He then developed temp 103 and leukocytosis to 14.6 k. UA (-), BCx ngtd.  He underwent repeat CT chest on 12-7 showing:1. 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 2. Gas containing tract extends from the loculated left effusion through the anterior and lateral left chest wall. Pleuro cutaneous fistula cannot be  excluded.   Past Medical History  Diagnosis Date  . Kidney stones   . Hypertension     Past Surgical History  Procedure Laterality Date  . Kidney surgery    . Video assisted thoracoscopy (vats)/decortication Left 2003  . Video assisted thoracoscopy (vats)/decortication Left 10/02/2015    Procedure: VIDEO ASSISTED THORACOSCOPY (VATS)/DECORTICATION and drainage of chronic empyena;  Surgeon: [name], MD;  Location: [facility];  Service: Thoracic;  Laterality: Left;  . Video bronchoscopy N/A 10/02/2015    Procedure: VIDEO BRONCHOSCOPY;  Surgeon: [name], MD;  Location: [facility] OR;  Service: Thoracic;  Laterality: N/A;     No Known Allergies  Medications:  Scheduled: . pantoprazole  40 mg Oral Daily    Abtx:  Anti-infectives    Start     Dose/Rate Route Frequency Ordered Stop   10/02/15 2000  cefUROXime (ZINACEF) 1.5 g in dextrose 5 % 50 mL IVPB     1.5 g 100 mL/hr over 30 Minutes Intravenous Every 12 hours 10/02/15 1505 10/03/15 0756   10/02/15 0616  cefUROXime (ZINACEF) 1.5 g in dextrose 5 % 50 mL IVPB     1.5 g 100 mL/hr over 30 Minutes Intravenous 60 min pre-op 10/02/15 0616 10/02/15 0755      Total days of antibiotics: 0          Social History:  reports that he has never smoked. He does not have any smokeless tobacco history on file. He reports that he drinks alcohol. He reports that he does not use illicit drugs.  History reviewed. No  pertinent family history.  Not clear of how his parents died (they were sick)  General ROS: c/o fever, chills, anorexia. no further diarrhea, no dysuria, occas cough. see HPI, 12 point ROS otherwise negative.   Blood pressure 118/71, pulse 92, temperature 98 F (36.7 C), temperature source Oral, resp. rate 18, height 5' (1.524 m), weight 42.18 kg (92 lb 15.8 oz), SpO2 97 %. General appearance: alert, cooperative, cachectic and no distress Eyes: negative findings: conjunctivae and sclerae normal and pupils equal, round,  reactive to light and accomodation Throat: lips, mucosa, and tongue normal; teeth and gums normal Neck: no adenopathy and supple, symmetrical, trachea midline Lungs: rhonchi anterior - left and chest wound is clean, well healing. non-tender.  Heart: regular rate and rhythm Abdomen: normal findings: bowel sounds normal and soft, non-tender Extremities: edema none, Homans sign is negative, no sign of DVT and no upper extr cordis Lymph nodes: Cervical, supraclavicular, and axillary nodes normal.   Results for orders placed or performed during the hospital encounter of 10/02/15 (from the past 48 hour(s))  Culture, blood (routine x 2)     Status: None (Preliminary result)   Collection Time: 10/10/15 10:54 PM  Result Value Ref Range   Specimen Description BLOOD RIGHT ANTECUBITAL    Special Requests      BOTTLES DRAWN AEROBIC AND ANAEROBIC 6CC BLUE 5CC PURPLE   Culture NO GROWTH < 24 HOURS    Report Status PENDING   Culture, blood (routine x 2)     Status: None (Preliminary result)   Collection Time: 10/10/15 11:01 PM  Result Value Ref Range   Specimen Description BLOOD LEFT ANTECUBITAL    Special Requests      BOTTLES DRAWN AEROBIC AND ANAEROBIC 6CC BLUE 5CC PURPLE   Culture NO GROWTH < 24 HOURS    Report Status PENDING   Culture, expectorated sputum-assessment     Status: None   Collection Time: 10/11/15  4:14 AM  Result Value Ref Range   Specimen Description EXPECTORATED SPUTUM    Special Requests NONE    Sputum evaluation      THIS SPECIMEN IS ACCEPTABLE. RESPIRATORY CULTURE REPORT TO FOLLOW.   Report Status 10/11/2015 FINAL   Urinalysis, Routine w reflex microscopic (not at [facility])     Status: None   Collection Time: 10/11/15  4:14 AM  Result Value Ref Range   Color, Urine YELLOW YELLOW   APPearance CLEAR CLEAR   Specific Gravity, Urine 1.022 1.005 - 1.030   pH 5.5 5.0 - 8.0   Glucose, UA NEGATIVE NEGATIVE mg/dL   Hgb urine dipstick NEGATIVE NEGATIVE   Bilirubin Urine NEGATIVE  NEGATIVE   Ketones, ur NEGATIVE NEGATIVE mg/dL   Protein, ur NEGATIVE NEGATIVE mg/dL   Nitrite NEGATIVE NEGATIVE   Leukocytes, UA NEGATIVE NEGATIVE    Comment: MICROSCOPIC NOT DONE ON URINES WITH NEGATIVE PROTEIN, BLOOD, LEUKOCYTES, NITRITE, OR GLUCOSE <1000 mg/dL.  Culture, respiratory (NON-Expectorated)     Status: None (Preliminary result)   Collection Time: 10/11/15  4:14 AM  Result Value Ref Range   Specimen Description EXPECTORATED SPUTUM    Special Requests NONE    Gram Stain PENDING    Culture      Culture reincubated for better growth Performed at [facility]    Report Status PENDING   Basic metabolic panel     Status: Abnormal   Collection Time: 10/11/15 10:44 AM  Result Value Ref Range   Sodium 134 (L) 135 - 145 mmol/L   Potassium 4.0  3.5 - 5.1 mmol/L   Chloride 98 (L) 101 - 111 mmol/L   CO2 28 22 - 32 mmol/L   Glucose, Bld 134 (H) 65 - 99 mg/dL   BUN 11 6 - 20 mg/dL   Creatinine, Ser 0.90 0.61 - 1.24 mg/dL   Calcium 8.6 (L) 8.9 - 10.3 mg/dL   GFR calc non Af Amer >60 >60 mL/min   GFR calc Af Amer >60 >60 mL/min    Comment: (NOTE) The eGFR has been calculated using the CKD EPI equation. This calculation has not been validated in all clinical situations. eGFR's persistently <60 mL/min signify possible Chronic Kidney Disease.    Anion gap 8 5 - 15  CBC     Status: Abnormal   Collection Time: 10/11/15 10:44 AM  Result Value Ref Range   WBC 14.6 (H) 4.0 - 10.5 K/uL   RBC 4.49 4.22 - 5.81 MIL/uL   Hemoglobin 12.8 (L) 13.0 - 17.0 g/dL   HCT 38.9 (L) 39.0 - 52.0 %   MCV 86.6 78.0 - 100.0 fL   MCH 28.5 26.0 - 34.0 pg   MCHC 32.9 30.0 - 36.0 g/dL   RDW 13.2 11.5 - 15.5 %   Platelets 385 150 - 400 K/uL      Component Value Date/Time   SDES EXPECTORATED SPUTUM 10/11/2015 0414   SDES EXPECTORATED SPUTUM 10/11/2015 0414   SPECREQUEST NONE 10/11/2015 0414   SPECREQUEST NONE 10/11/2015 0414   CULT  10/11/2015 0414    Culture reincubated for better  growth Performed at [facility] 10/11/2015 FINAL 10/11/2015 0414   REPTSTATUS PENDING 10/11/2015 0414   Dg Chest 2 View  10/12/2015  CLINICAL DATA:  Left hydro pneumothorax. EXAM: CHEST  2 VIEW COMPARISON:  PA and lateral chest x-ray of October 11, 2015 and chest CT scan of the same date FINDINGS: On the left a small meniscus is visible in the upper hemi thorax. There is pleural fluid surrounding the periphery of the left lung. The right lung is well-expanded. There is a trace of pleural fluid blunting the costophrenic angles. There is stable scarring in the right lower hemi thorax. The heart is normal in size. The pulmonary vascularity is not engorged. The central pulmonary vascularity is prominent but stable. The trachea is midline. IMPRESSION: Stable appearance of the left-sided hydro pneumothorax/ gas containing empyema. Irregular pleural thickening-fluid along the periphery of the left lung consistent with clinically suspected empyema. Stable small right pleural effusion. Stable central pulmonary vascular prominence. Electronically Signed   By: David  Martinique M.D.   On: 10/12/2015 07:50   Dg Chest 2 View  10/11/2015  CLINICAL DATA:  Pneumothorax. EXAM: CHEST  2 VIEW COMPARISON:  10/10/2015.  10/08/2015 . FINDINGS: Mediastinum hilar structures are stable. Heart size stable. Prominent central pulmonary arteries noted suggesting pulmonary hypertension. Chronic interstitial prominence consistent chronic interstitial lung disease. Stable left hydro pneumothorax. Stable right pleural thickening most consistent with scarring and/or stable small pleural effusion. No acute osseus abnormality . IMPRESSION: 1. Stable left hydro pneumothorax. 2. Chronic interstitial lung disease. Small right pleural effusion and/or pleural scarring. 3. Prominent central pulmonary arteries suggesting pulmonary hypertension. Electronically Signed   By: Marcello Moores  Register   On: 10/11/2015 07:41   Ct Chest  Without Contrast  10/11/2015  CLINICAL DATA:  Postop fever. Status post video-assisted thorascopic/decortication and drainage of chronic left empyema. EXAM: CT CHEST WITHOUT CONTRAST TECHNIQUE: Multidetector CT imaging of the chest was performed following the standard protocol without  IV contrast. COMPARISON:  09/26/2015. FINDINGS: Mediastinum: Mild cardiac enlargement is noted. There is aortic atherosclerosis identified. The trachea appears patent and is midline. Unremarkable appearance of the esophagus. No significant mediastinal or hilar adenopathy. The main pulmonary artery appears increased in diameter measuring 4.6 cm. Findings are worrisome for PA hypertension. Lungs/Pleura: Bilateral pleural calcifications are again noted. Loculated left pleural fluid collection is again identified. On the current exam there is a fluid level identified within the loculation overlying the posterior lateral left upper lobe. Overlying the left lower lung zone is gas an intermediate attenuation fluid which is worrisome for persistent/recurrent empyema, image number 46 of series 2. A small gas containing tract is identified within the anterior and lateral left chest wall, image 43/series 2. Bilateral areas of bronchial wall thickening identified. Scarring is noted within the right lung base. Upper Abdomen: No focal liver abnormality identified. Splenic granulomas are noted. Musculoskeletal: No acute bone abnormality identified. IMPRESSION: 1. 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 2. Gas containing tract extends from the loculated left effusion through the anterior and lateral left chest wall. Pleuro cutaneous fistula cannot be excluded. 3. Chronic bilateral pleural calcifications. 4. Aortic atherosclerosis 5. Increase caliber of the pulmonary arteries worrisome for PA hypertension. Electronically Signed   By: Signa Kell M.D.   On: 10/11/2015 19:54   Dg Chest Port 1 View  10/10/2015   CLINICAL DATA:  Acute onset of fever. Status post removal of left-sided chest tube. Initial encounter. EXAM: PORTABLE CHEST 1 VIEW COMPARISON:  Chest radiograph performed earlier today at 6:40 a.m. FINDINGS: A mildly increased loculated small left pleural effusion is noted. The previously noted underlying loculated pneumothorax is less apparent. Calcification is noted at the right lung base. A small right pleural effusion is again noted. There is mild left-sided volume loss, with leftward mediastinal shift. The cardiomediastinal silhouette remains normal in size. No acute osseous abnormalities are seen. IMPRESSION: Mildly increased loculated small left pleural effusion; previously noted underlying small loculated pneumothorax is less apparent than on the prior study, status post removal of left-sided chest tube. Small right pleural effusion again noted. Electronically Signed   By: Roanna Raider M.D.   On: 10/10/2015 22:37   Recent Results (from the past 240 hour(s))  Culture, blood (routine x 2)     Status: None (Preliminary result)   Collection Time: 10/10/15 10:54 PM  Result Value Ref Range Status   Specimen Description BLOOD RIGHT ANTECUBITAL  Final   Special Requests   Final    BOTTLES DRAWN AEROBIC AND ANAEROBIC 6CC BLUE 5CC PURPLE   Culture NO GROWTH < 24 HOURS  Final   Report Status PENDING  Incomplete  Culture, blood (routine x 2)     Status: None (Preliminary result)   Collection Time: 10/10/15 11:01 PM  Result Value Ref Range Status   Specimen Description BLOOD LEFT ANTECUBITAL  Final   Special Requests   Final    BOTTLES DRAWN AEROBIC AND ANAEROBIC 6CC BLUE 5CC PURPLE   Culture NO GROWTH < 24 HOURS  Final   Report Status PENDING  Incomplete  Culture, expectorated sputum-assessment     Status: None   Collection Time: 10/11/15  4:14 AM  Result Value Ref Range Status   Specimen Description EXPECTORATED SPUTUM  Final   Special Requests NONE  Final   Sputum evaluation   Final    THIS  SPECIMEN IS ACCEPTABLE. RESPIRATORY CULTURE REPORT TO FOLLOW.   Report Status 10/11/2015 FINAL  Final  Culture, respiratory (NON-Expectorated)     Status: None (Preliminary result)   Collection Time: 10/11/15  4:14 AM  Result Value Ref Range Status   Specimen Description EXPECTORATED SPUTUM  Final   Special Requests NONE  Final   Gram Stain PENDING  Incomplete   Culture   Final    Culture reincubated for better growth Performed at [facility]    Report Status PENDING  Incomplete      10/12/2015, 10:06 AM     LOS: 10 days    Records and images were personally reviewed where available.

## 2015-10-13 DIAGNOSIS — R197 Diarrhea, unspecified: Secondary | ICD-10-CM

## 2015-10-13 LAB — CULTURE, RESPIRATORY W GRAM STAIN: Culture: NORMAL

## 2015-10-13 LAB — CBC
HCT: 34.9 % — ABNORMAL LOW (ref 39.0–52.0)
HEMOGLOBIN: 11.9 g/dL — AB (ref 13.0–17.0)
MCH: 29.1 pg (ref 26.0–34.0)
MCHC: 34.1 g/dL (ref 30.0–36.0)
MCV: 85.3 fL (ref 78.0–100.0)
PLATELETS: 371 10*3/uL (ref 150–400)
RBC: 4.09 MIL/uL — ABNORMAL LOW (ref 4.22–5.81)
RDW: 12.8 % (ref 11.5–15.5)
WBC: 19 10*3/uL — ABNORMAL HIGH (ref 4.0–10.5)

## 2015-10-13 LAB — C DIFFICILE QUICK SCREEN W PCR REFLEX
C DIFFICILE (CDIFF) TOXIN: NEGATIVE
C Diff antigen: NEGATIVE
C Diff interpretation: NEGATIVE

## 2015-10-13 NOTE — Progress Notes (Signed)
INFECTIOUS DISEASE PROGRESS NOTE  ID: [name] [name] is a 54 y.o. male with  Active Problems:   Empyema, left (HCC)  Subjective: sitting up in bed, eating fruit.  3 loose BM this AM.   Abtx:  Anti-infectives    Start     Dose/Rate Route Frequency Ordered Stop   10/12/15 2130  piperacillin-tazobactam (ZOSYN) IVPB 3.375 g     3.375 g 12.5 mL/hr over 240 Minutes Intravenous 3 times per day 10/12/15 2107     10/12/15 1300  vancomycin (VANCOCIN) IVPB 1000 mg/200 mL premix     1,000 mg 200 mL/hr over 60 Minutes Intravenous Every 24 hours 10/12/15 1202     10/12/15 1200  vancomycin (VANCOCIN) IVPB 750 mg/150 ml premix  Status:  Discontinued     750 mg 150 mL/hr over 60 Minutes Intravenous  Once 10/12/15 1151 10/12/15 1202   10/12/15 1200  piperacillin-tazobactam (ZOSYN) IVPB 3.375 g     3.375 g 100 mL/hr over 30 Minutes Intravenous  Once 10/12/15 1151 10/12/15 1338   10/02/15 2000  cefUROXime (ZINACEF) 1.5 g in dextrose 5 % 50 mL IVPB     1.5 g 100 mL/hr over 30 Minutes Intravenous Every 12 hours 10/02/15 1505 10/03/15 0756   10/02/15 0616  cefUROXime (ZINACEF) 1.5 g in dextrose 5 % 50 mL IVPB     1.5 g 100 mL/hr over 30 Minutes Intravenous 60 min pre-op 10/02/15 0616 10/02/15 0755      Medications:  Scheduled: . pantoprazole  40 mg Oral Daily  . piperacillin-tazobactam (ZOSYN)  IV  3.375 g Intravenous 3 times per day  . vancomycin  1,000 mg Intravenous Q24H    Objective: Vital signs in last 24 hours: Temp:  [98.1 F (36.7 C)-102.6 F (39.2 C)] 98.1 F (36.7 C) (12/09 0427) Pulse Rate:  [87-110] 87 (12/09 0427) Resp:  [15-18] 16 (12/09 0427) BP: (107-131)/(63-85) 107/74 mmHg (12/09 0427) SpO2:  [93 %-100 %] 100 % (12/09 0427)   General appearance: alert, cooperative and no distress Resp: diminished breath sounds LUL and rhonchi LUL Cardio: regular rate and rhythm GI: normal findings: bowel sounds normal and soft, non-tender  Lab Results  Recent Labs  10/11/15 1044  10/13/15 0226  WBC 14.6* 19.0*  HGB 12.8* 11.9*  HCT 38.9* 34.9*  NA 134*  --   K 4.0  --   CL 98*  --   CO2 28  --   BUN 11  --   CREATININE 0.90  --    Liver Panel No results for input(s): PROT, ALBUMIN, AST, ALT, ALKPHOS, BILITOT, BILIDIR, IBILI in the last 72 hours. Sedimentation Rate No results for input(s): ESRSEDRATE in the last 72 hours. C-Reactive Protein No results for input(s): CRP in the last 72 hours.  Microbiology: Recent Results (from the past 240 hour(s))  Culture, blood (routine x 2)     Status: None (Preliminary result)   Collection Time: 10/10/15 10:54 PM  Result Value Ref Range Status   Specimen Description BLOOD RIGHT ANTECUBITAL  Final   Special Requests   Final    BOTTLES DRAWN AEROBIC AND ANAEROBIC 6CC BLUE 5CC PURPLE   Culture NO GROWTH 2 DAYS  Final   Report Status PENDING  Incomplete  Culture, blood (routine x 2)     Status: None (Preliminary result)   Collection Time: 10/10/15 11:01 PM  Result Value Ref Range Status   Specimen Description BLOOD LEFT ANTECUBITAL  Final   Special Requests   Final  BOTTLES DRAWN AEROBIC AND ANAEROBIC 6CC BLUE 5CC PURPLE   Culture NO GROWTH 2 DAYS  Final   Report Status PENDING  Incomplete  Culture, expectorated sputum-assessment     Status: None   Collection Time: 10/11/15  4:14 AM  Result Value Ref Range Status   Specimen Description EXPECTORATED SPUTUM  Final   Special Requests NONE  Final   Sputum evaluation   Final    THIS SPECIMEN IS ACCEPTABLE. RESPIRATORY CULTURE REPORT TO FOLLOW.   Report Status 10/11/2015 FINAL  Final  Culture, respiratory (NON-Expectorated)     Status: None   Collection Time: 10/11/15  4:14 AM  Result Value Ref Range Status   Specimen Description EXPECTORATED SPUTUM  Final   Special Requests NONE  Final   Gram Stain   Final    MODERATE WBC PRESENT, PREDOMINANTLY PMN MODERATE SQUAMOUS EPITHELIAL CELLS PRESENT ABUNDANT GRAM POSITIVE COCCI IN PAIRS IN CHAINS IN CLUSTERS FEW GRAM  NEGATIVE RODS Performed at [facility]    Culture   Final    NORMAL OROPHARYNGEAL FLORA Performed at [facility]    Report Status 10/13/2015 FINAL  Final    Studies/Results: Dg Chest 2 View  10/12/2015  CLINICAL DATA:  Left hydro pneumothorax. EXAM: CHEST  2 VIEW COMPARISON:  PA and lateral chest x-ray of October 11, 2015 and chest CT scan of the same date FINDINGS: On the left a small meniscus is visible in the upper hemi thorax. There is pleural fluid surrounding the periphery of the left lung. The right lung is well-expanded. There is a trace of pleural fluid blunting the costophrenic angles. There is stable scarring in the right lower hemi thorax. The heart is normal in size. The pulmonary vascularity is not engorged. The central pulmonary vascularity is prominent but stable. The trachea is midline. IMPRESSION: Stable appearance of the left-sided hydro pneumothorax/ gas containing empyema. Irregular pleural thickening-fluid along the periphery of the left lung consistent with clinically suspected empyema. Stable small right pleural effusion. Stable central pulmonary vascular prominence. Electronically Signed   By: David  Martinique M.D.   On: 10/12/2015 07:50   Ct Chest Without Contrast  10/11/2015  CLINICAL DATA:  Postop fever. Status post video-assisted thorascopic/decortication and drainage of chronic left empyema. EXAM: CT CHEST WITHOUT CONTRAST TECHNIQUE: Multidetector CT imaging of the chest was performed following the standard protocol without IV contrast. COMPARISON:  09/26/2015. FINDINGS: Mediastinum: Mild cardiac enlargement is noted. There is aortic atherosclerosis identified. The trachea appears patent and is midline. Unremarkable appearance of the esophagus. No significant mediastinal or hilar adenopathy. The main pulmonary artery appears increased in diameter measuring 4.6 cm. Findings are worrisome for PA hypertension. Lungs/Pleura: Bilateral pleural calcifications are  again noted. Loculated left pleural fluid collection is again identified. On the current exam there is a fluid level identified within the loculation overlying the posterior lateral left upper lobe. Overlying the left lower lung zone is gas an intermediate attenuation fluid which is worrisome for persistent/recurrent empyema, image number 46 of series 2. A small gas containing tract is identified within the anterior and lateral left chest wall, image 43/series 2. Bilateral areas of bronchial wall thickening identified. Scarring is noted within the right lung base. Upper Abdomen: No focal liver abnormality identified. Splenic granulomas are noted. Musculoskeletal: No acute bone abnormality identified. IMPRESSION: 1. 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 2. Gas containing tract extends from the loculated left effusion through the anterior and lateral left chest wall. Pleuro cutaneous fistula  cannot be excluded. 3. Chronic bilateral pleural calcifications. 4. Aortic atherosclerosis 5. Increase caliber of the pulmonary arteries worrisome for PA hypertension. Electronically Signed   By: Kerby Moors M.D.   On: 10/11/2015 19:54   Dg Abd Portable 1v  10/12/2015  CLINICAL DATA:  Pt complains of feeling constipated. Diarrhea and fever today. EXAM: PORTABLE ABDOMEN - 1 VIEW COMPARISON:  Chest x-ray 10/12/2015 FINDINGS: Bowel gas pattern is nonobstructive. No evidence for free intraperitoneal air on this supine view. No significant stool burden detected. No abnormal calcifications or evidence for organomegaly. Visualized osseous structures have a normal appearance. IMPRESSION: No evidence for acute  abnormality. Electronically Signed   By: Nolon Nations M.D.   On: 10/12/2015 14:32     Assessment/Plan: Fever Empyema  Day 2 vanco/zosyn  Cont fever, loose stol c diff pending Will also send stool pathogen panel, noro is circulating in communnity Await sputum, other  cx  His specimen has been sent to [place] (pager) [phone] [email or web address] 10/13/2015, 10:20 AM  LOS: 11 days

## 2015-10-13 NOTE — Progress Notes (Addendum)
       [address]       [employer] [address]             [phone]          11 Days Post-Op Procedure(s) (LRB): VIDEO ASSISTED THORACOSCOPY (VATS)/DECORTICATION and drainage of chronic empyena (Left) VIDEO BRONCHOSCOPY (N/A)  Subjective: Complaining of sweats this am. More loose watery stools overnight. Breathing stable, very little cough and mostly nonproductive. Appetite poor.    Objective: Vital signs in last 24 hours: Patient Vitals for the past 24 hrs:  BP Temp Temp src Pulse Resp SpO2  10/13/15 0427 107/74 mmHg 98.1 F (36.7 C) Oral 87 16 100 %  10/13/15 0010 - (!) 101.8 F (38.8 C) Oral - - -  10/12/15 2220 115/63 mmHg (!) 102.6 F (39.2 C) Oral (!) 109 15 93 %  10/12/15 2018 - (!) 100.9 F (38.3 C) Oral - - -  10/12/15 1700 - 99.7 F (37.6 C) Oral - - -  10/12/15 1329 131/85 mmHg (!) 102.3 F (39.1 C) Oral (!) 110 18 98 %   Current Weight  10/02/15 92 lb 15.8 oz (42.18 kg)     Intake/Output from previous day: 12/08 0701 - 12/09 0700 In: 680 [P.O.:480; IV Piggyback:200] Out: -     PHYSICAL EXAM:  Heart: RRR, mildly tachy around 100 Lungs: Diminished BS bilaterally no rhonchi or wheezes Wound: Clean and dry Abdomen: Soft, NT/ND, +BS Extremities: Warm, well perfused    Lab Results: CBC: Recent Labs  10/11/15 1044 10/13/15 0226  WBC 14.6* 19.0*  HGB 12.8* 11.9*  HCT 38.9* 34.9*  PLT 385 371   BMET:  Recent Labs  10/11/15 1044  NA 134*  K 4.0  CL 98*  CO2 28  GLUCOSE 134*  BUN 11  CREATININE 0.90  CALCIUM 8.6*    PT/INR: No results for input(s): LABPROT, INR in the last 72 hours.  Abd XR: FINDINGS: Bowel gas pattern is nonobstructive. No evidence for free intraperitoneal air on this supine view. No significant stool burden detected. No abnormal calcifications or evidence for organomegaly. Visualized osseous structures have a normal appearance.  IMPRESSION: No evidence for acute abnormality.   Sputum  Cx: ABUNDANT GRAM POSITIVE COCCI  IN PAIRS IN CHAINS IN CLUSTERS FEW GRAM NEGATIVE RODS     Assessment/Plan: S/P Procedure(s) (LRB): VIDEO ASSISTED THORACOSCOPY (VATS)/DECORTICATION and drainage of chronic empyena (Left) VIDEO BRONCHOSCOPY (N/A) Pt continues to spike temps, Tm 102.6 this am with reported sweats and recurrent loose watery stools. Will order a stool for C diff since he has been on antibiotics and for completeness' sake. ID following and pt presently on Vanc/Zosyn empirically. Blood, urine and OR cultures all negative. Sputum cx with abundant gr + cocci in pairs and few gr - rods. No other obvious source. Abdominal x-ray benign.   LOS: 11 days    [name],[name] H 10/13/2015   Loose stools have returned  Follow up chest xray, had chest tube in , temp spiked 12 hours after tube removed I have seen and examined [name] [name] and agree with the above assessment  and plan.  [name] MD Beeper [phone] Office [phone] 10/13/2015 1:10 PM

## 2015-10-14 ENCOUNTER — Inpatient Hospital Stay (HOSPITAL_COMMUNITY): Payer: BLUE CROSS/BLUE SHIELD

## 2015-10-14 LAB — BASIC METABOLIC PANEL
Anion gap: 8 (ref 5–15)
BUN: 15 mg/dL (ref 6–20)
CHLORIDE: 98 mmol/L — AB (ref 101–111)
CO2: 29 mmol/L (ref 22–32)
CREATININE: 1.15 mg/dL (ref 0.61–1.24)
Calcium: 8.4 mg/dL — ABNORMAL LOW (ref 8.9–10.3)
GFR calc Af Amer: >60 mL/min (ref >60)
GFR calc non Af Amer: >60 mL/min (ref >60)
GLUCOSE: 111 mg/dL — AB (ref 65–99)
POTASSIUM: 3.5 mmol/L (ref 3.5–5.1)
SODIUM: 135 mmol/L (ref 135–145)

## 2015-10-14 LAB — CBC
HEMATOCRIT: 32.4 % — AB (ref 39.0–52.0)
Hemoglobin: 10.7 g/dL — ABNORMAL LOW (ref 13.0–17.0)
MCH: 28 pg (ref 26.0–34.0)
MCHC: 33 g/dL (ref 30.0–36.0)
MCV: 84.8 fL (ref 78.0–100.0)
PLATELETS: 364 10*3/uL (ref 150–400)
RBC: 3.82 MIL/uL — ABNORMAL LOW (ref 4.22–5.81)
RDW: 12.9 % (ref 11.5–15.5)
WBC: 11.3 10*3/uL — AB (ref 4.0–10.5)

## 2015-10-14 MED ORDER — BENZONATATE 100 MG PO CAPS
100.0000 mg | ORAL_CAPSULE | Freq: Three times a day (TID) | ORAL | Status: DC | PRN
  Administered 2015-10-14 – 2015-10-16 (×4): 100 mg via ORAL
  Filled 2015-10-14 (×4): qty 1

## 2015-10-14 NOTE — Progress Notes (Addendum)
[address]       [employer] [address]             [phone]      12 Days Post-Op Procedure(s) (LRB): VIDEO ASSISTED THORACOSCOPY (VATS)/DECORTICATION and drainage of chronic empyena (Left) VIDEO BRONCHOSCOPY (N/A) Subjective: Feels a little better, less diarrhea, some hemoptysis/cough  Objective: Vital signs in last 24 hours: Temp:  [98.3 F (36.8 C)-100.8 F (38.2 C)] 98.3 F (36.8 C) (12/10 0436) Pulse Rate:  [95-104] 95 (12/10 0436) Cardiac Rhythm:  [-] Sinus tachycardia (12/09 1950) Resp:  [16-17] 16 (12/10 0436) BP: (106-120)/(62-80) 106/62 mmHg (12/10 0436) SpO2:  [93 %-98 %] 93 % (12/10 0436)  Hemodynamic parameters for last 24 hours:    Intake/Output from previous day: 12/09 0701 - 12/10 0700 In: 940 [P.O.:840; IV Piggyback:100] Out: 0  Intake/Output this shift: Total I/O In: 120 [P.O.:120] Out: -   Temp curve decreasing but still up to 101 at times.   General appearance: alert, cooperative and no distress Heart: regular rate and rhythm Lungs: dim BS, no wheeze or ronchi Abdomen: soft, non-tender Extremities: no edema or calf tenderness Wound: incis healing well    Lab Results:  Recent Labs  10/13/15 0226 10/14/15 0315  WBC 19.0* 11.3*  HGB 11.9* 10.7*  HCT 34.9* 32.4*  PLT 371 364   BMET:  Recent Labs  10/11/15 1044 10/14/15 0315  NA 134* 135  K 4.0 3.5  CL 98* 98*  CO2 28 29  GLUCOSE 134* 111*  BUN 11 15  CREATININE 0.90 1.15  CALCIUM 8.6* 8.4*    PT/INR: No results for input(s): LABPROT, INR in the last 72 hours. ABG    Component Value Date/Time   PHART 7.331* 10/03/2015 0317   HCO3 31.4* 10/03/2015 0317   TCO2 33.2 10/03/2015 0317   O2SAT 99.3 10/03/2015 0317   CBG (last 3)  No results for input(s): GLUCAP in the last 72 hours.  Results for orders placed or performed during the hospital encounter of 10/02/15  Surgical pcr screen     Status: None   Collection Time: 10/02/15  6:48 AM  Result  Value Ref Range Status   MRSA, PCR NEGATIVE NEGATIVE Final   Staphylococcus aureus NEGATIVE NEGATIVE Final    Comment:        The Xpert SA Assay (FDA approved for NASAL specimens in patients over 18 years of age), is one component of a comprehensive surveillance program.  Test performance has been validated by [facility] for patients greater than or equal to 42 year old. It is not intended to diagnose infection nor to guide or monitor treatment.   AFB culture with smear     Status: None (Preliminary result)   Collection Time: 10/02/15  7:47 AM  Result Value Ref Range Status   Specimen Description BRONCHIAL WASHINGS LEFT  Final   Special Requests SPEC A  Final   Acid Fast Smear   Final    NO ACID FAST BACILLI SEEN Performed at [facility]    Culture   Final    CULTURE WILL BE EXAMINED FOR 6 WEEKS BEFORE ISSUING A FINAL REPORT Performed at [facility]    Report Status PENDING  Incomplete  Culture, respiratory (NON-Expectorated)     Status: None   Collection Time: 10/02/15  7:47 AM  Result Value Ref Range Status   Specimen Description BRONCHIAL WASHINGS LEFT  Final   Special Requests [place] A  Final  Gram Stain   Final    NO WBC SEEN NO SQUAMOUS EPITHELIAL CELLS SEEN NO ORGANISMS SEEN Performed at [facility]    Culture   Final    FEW HAEMOPHILUS INFLUENZAE Note: BETA LACTAMASE POSITIVE Performed at [facility]    Report Status 10/05/2015 FINAL  Final  Fungus Culture with Smear     Status: None (Preliminary result)   Collection Time: 10/02/15  8:39 AM  Result Value Ref Range Status   Specimen Description FLUID PLEURAL LEFT  Final   Special Requests SPEC B  Final   Fungal Smear   Final    NO YEAST OR FUNGAL ELEMENTS SEEN Performed at [facility]    Culture   Final    CULTURE IN PROGRESS FOR FOUR WEEKS Performed at [facility]    Report Status PENDING  Incomplete  AFB culture with smear     Status: None  (Preliminary result)   Collection Time: 10/02/15  8:39 AM  Result Value Ref Range Status   Specimen Description FLUID PLEURAL LEFT  Final   Special Requests SPEC B  Final   Acid Fast Smear   Final    NO ACID FAST BACILLI SEEN Performed at [facility]    Culture   Final    CULTURE WILL BE EXAMINED FOR 6 WEEKS BEFORE ISSUING A FINAL REPORT Performed at [facility]    Report Status PENDING  Incomplete  Culture, body fluid-bottle     Status: None   Collection Time: 10/02/15  8:39 AM  Result Value Ref Range Status   Specimen Description FLUID LEFT PLEURAL  Final   Special Requests BOTTLES DRAWN AEROBIC AND ANAEROBIC 5CCS  Final   Culture NO GROWTH 5 DAYS  Final   Report Status 10/07/2015 FINAL  Final  Gram stain     Status: None   Collection Time: 10/02/15  8:39 AM  Result Value Ref Range Status   Specimen Description FLUID LEFT PLEURAL  Final   Special Requests NONE  Final   Gram Stain   Final    DEGENERATED CELLULAR MATERIAL PRESENT NO ORGANISMS SEEN    Report Status 10/02/2015 FINAL  Final  Anaerobic culture     Status: None   Collection Time: 10/02/15  8:50 AM  Result Value Ref Range Status   Specimen Description FLUID PLEURAL LEFT  Final   Special Requests SPEC C ON SWAB  Final   Gram Stain   Final    MODERATE WBC PRESENT,BOTH PMN AND MONONUCLEAR NO ORGANISMS SEEN    Culture   Final    NO ANAEROBES ISOLATED; CULTURE IN PROGRESS FOR 5 DAYS   Report Status 10/07/2015 FINAL  Final  Body fluid culture     Status: None   Collection Time: 10/02/15  8:50 AM  Result Value Ref Range Status   Specimen Description FLUID PLEURAL LEFT  Final   Special Requests SPEC C ON SWAB  Final   Gram Stain   Final    MODERATE WBC PRESENT,BOTH PMN AND MONONUCLEAR NO ORGANISMS SEEN    Culture NO GROWTH 3 DAYS  Final   Report Status 10/05/2015 FINAL  Final  Anaerobic culture     Status: None   Collection Time: 10/02/15  8:53 AM  Result Value Ref Range Status   Specimen  Description TISSUE LUNG LEFT  Final   Special Requests SPEC D  Final   Gram Stain   Final    NO WBC SEEN NO  ORGANISMS SEEN Performed at [facility]    Culture   Final    NO ANAEROBES ISOLATED Performed at [facility]    Report Status 10/07/2015 FINAL  Final  Tissue culture     Status: None   Collection Time: 10/02/15  8:53 AM  Result Value Ref Range Status   Specimen Description TISSUE LUNG LEFT  Final   Special Requests SPEC D PER ALLYSA HEGARTY UNABLE TO MODIFY IN EPIC  Final   Gram Stain   Final    NO WBC SEEN NO ORGANISMS SEEN Performed at [facility]    Culture   Final    NO GROWTH 3 DAYS Performed at [facility]    Report Status 10/06/2015 FINAL  Final  Culture, blood (routine x 2)     Status: None (Preliminary result)   Collection Time: 10/10/15 10:54 PM  Result Value Ref Range Status   Specimen Description BLOOD RIGHT ANTECUBITAL  Final   Special Requests   Final    BOTTLES DRAWN AEROBIC AND ANAEROBIC 6CC BLUE 5CC PURPLE   Culture NO GROWTH 3 DAYS  Final   Report Status PENDING  Incomplete  Culture, blood (routine x 2)     Status: None (Preliminary result)   Collection Time: 10/10/15 11:01 PM  Result Value Ref Range Status   Specimen Description BLOOD LEFT ANTECUBITAL  Final   Special Requests   Final    BOTTLES DRAWN AEROBIC AND ANAEROBIC 6CC BLUE 5CC PURPLE   Culture NO GROWTH 3 DAYS  Final   Report Status PENDING  Incomplete  Culture, expectorated sputum-assessment     Status: None   Collection Time: 10/11/15  4:14 AM  Result Value Ref Range Status   Specimen Description EXPECTORATED SPUTUM  Final   Special Requests NONE  Final   Sputum evaluation   Final    THIS SPECIMEN IS ACCEPTABLE. RESPIRATORY CULTURE REPORT TO FOLLOW.   Report Status 10/11/2015 FINAL  Final  Culture, respiratory (NON-Expectorated)     Status: None   Collection Time: 10/11/15  4:14 AM  Result Value Ref Range Status   Specimen Description  EXPECTORATED SPUTUM  Final   Special Requests NONE  Final   Gram Stain   Final    MODERATE WBC PRESENT, PREDOMINANTLY PMN MODERATE SQUAMOUS EPITHELIAL CELLS PRESENT ABUNDANT GRAM POSITIVE COCCI IN PAIRS IN CHAINS IN CLUSTERS FEW GRAM NEGATIVE RODS Performed at [facility]    Culture   Final    NORMAL OROPHARYNGEAL FLORA Performed at [facility]    Report Status 10/13/2015 FINAL  Final  C difficile quick scan w PCR reflex     Status: None   Collection Time: 10/13/15 12:50 PM  Result Value Ref Range Status   C Diff antigen NEGATIVE NEGATIVE Final   C Diff toxin NEGATIVE NEGATIVE Final   C Diff interpretation Negative for toxigenic C. difficile  Final     Meds Scheduled Meds: . pantoprazole  40 mg Oral Daily  . piperacillin-tazobactam (ZOSYN)  IV  3.375 g Intravenous 3 times per day  . vancomycin  1,000 mg Intravenous Q24H   Continuous Infusions: . 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 Chest tube removed. EXAM: CHEST  2 VIEW  COMPARISON:  10/12/2015 FINDINGS: Persistent loculated left hydro pneumothorax compatible with chronic empyema. Small left pleural air-fluid levels persist. Residual atelectasis and airspace disease in the left lung. No significant change in aeration. Stable heart size. Pulmonary arteries appear enlarged. Trace right effusion as before. Stable right lung aeration. Trachea is midline. No significant interval change. IMPRESSION: Stable loculated left hydro pneumothorax compatible with empyema and residual patchy left lung airspace process versus atelectasis. No significant change. Stable trace right effusion Electronically Signed   By: Jerilynn Mages.  Shick M.D.   On: 10/14/2015 09:02   Dg Abd  Portable 1v  10/12/2015  CLINICAL DATA:  Pt complains of feeling constipated. Diarrhea and fever today. EXAM: PORTABLE ABDOMEN - 1 VIEW COMPARISON:  Chest x-ray 10/12/2015 FINDINGS: Bowel gas pattern is nonobstructive. No evidence for free intraperitoneal air on this supine view. No significant stool burden detected. No abnormal calcifications or evidence for organomegaly. Visualized osseous structures have a normal appearance. IMPRESSION: No evidence for acute  abnormality. Electronically Signed   By: Nolon Nations M.D.   On: 10/12/2015 14:32    Assessment/Plan: S/P Procedure(s) (LRB): VIDEO ASSISTED THORACOSCOPY (VATS)/DECORTICATION and drainage of chronic empyena (Left) VIDEO BRONCHOSCOPY (N/A)  1 appears to be defervescing with current abx - management as per ID team 2 cdiff is negative     LOS: 12 days    GOLD,[name] 10/14/2015  Had cough with blood tinged sputum last night Feels better Continues on iv antibiotics c diffe negative I have seen and examined [name] and agree with the above assessment  and plan.  [name] MD Beeper [phone] Office [phone] 10/14/2015 12:00 PM

## 2015-10-15 LAB — CULTURE, BLOOD (ROUTINE X 2)
CULTURE: NO GROWTH
CULTURE: NO GROWTH

## 2015-10-15 NOTE — Progress Notes (Signed)
ANTIBIOTIC CONSULT NOTE  Pharmacy Consult for zosyn/vancomycin Indication: empyema  No Known Allergies  Patient Measurements: Height: 5' (152.4 cm) Weight: 92 lb 15.8 oz (42.18 kg) IBW/kg (Calculated) : 50  Vital Signs: Temp: 97.9 F (36.6 C) (12/11 0536) Temp Source: Oral (12/11 0536) BP: 95/58 mmHg (12/11 0536) Pulse Rate: 96 (12/11 0536) Intake/Output from previous day: 12/10 0701 - 12/11 0700 In: 240 [P.O.:240] Out: -  Intake/Output from this shift:    Labs:  Recent Labs  10/13/15 0226 10/14/15 0315  WBC 19.0* 11.3*  HGB 11.9* 10.7*  PLT 371 364  CREATININE  --  1.15   Estimated Creatinine Clearance: 44.3 mL/min (by C-G formula based on Cr of 1.15). No results for input(s): VANCOTROUGH, VANCOPEAK, VANCORANDOM, GENTTROUGH, GENTPEAK, GENTRANDOM, TOBRATROUGH, TOBRAPEAK, TOBRARND, AMIKACINPEAK, AMIKACINTROU, AMIKACIN in the last 72 hours.   Microbiology: Recent Results (from the past 720 hour(s))  Tissue culture     Status: None   Collection Time: 09/27/15  4:00 PM  Result Value Ref Range Status   Specimen Description TISSUE LEFT PLEURAL  Final   Special Requests NONE  Final   Gram Stain   Final    NO WBC SEEN NO SQUAMOUS EPITHELIAL CELLS SEEN NO ORGANISMS SEEN Performed at [facility]    Culture   Final    NO GROWTH 3 DAYS Performed at [facility]    Report Status 10/01/2015 FINAL  Final  OVA + PARASITE EXAM     Status: None   Collection Time: 09/29/15  9:49 AM  Result Value Ref Range Status   OVA + PARASITE EXAM Final report  Final    Comment: (NOTE) These results were obtained using wet preparation(s) and trichrome stained smear. This test does not include testing for Cryptosporidium parvum, Cyclospora, or Microsporidia. Performed At: [facility], [place] [id number] Elwanda Brooklyn R MD [id number]    Source of Sample NONE  Final  Surgical pcr screen     Status: None   Collection Time: 10/02/15   6:48 AM  Result Value Ref Range Status   MRSA, PCR NEGATIVE NEGATIVE Final   Staphylococcus aureus NEGATIVE NEGATIVE Final    Comment:        The Xpert SA Assay (FDA approved for NASAL specimens in patients over 28 years of age), is one component of a comprehensive surveillance program.  Test performance has been validated by [facility] for patients greater than or equal to 34 year old. It is not intended to diagnose infection nor to guide or monitor treatment.   AFB culture with smear     Status: None (Preliminary result)   Collection Time: 10/02/15  7:47 AM  Result Value Ref Range Status   Specimen Description BRONCHIAL WASHINGS LEFT  Final   Special Requests SPEC A  Final   Acid Fast Smear   Final    NO ACID FAST BACILLI SEEN Performed at [facility]    Culture   Final    CULTURE WILL BE EXAMINED FOR 6 WEEKS BEFORE ISSUING A FINAL REPORT Performed at [facility]    Report Status PENDING  Incomplete  Culture, respiratory (NON-Expectorated)     Status: None   Collection Time: 10/02/15  7:47 AM  Result Value Ref Range Status   Specimen Description BRONCHIAL WASHINGS LEFT  Final   Special Requests SPEC A  Final   Gram Stain   Final    NO WBC SEEN NO SQUAMOUS EPITHELIAL CELLS SEEN  NO ORGANISMS SEEN Performed at [facility]    Culture   Final    FEW HAEMOPHILUS INFLUENZAE Note: BETA LACTAMASE POSITIVE Performed at [facility]    Report Status 10/05/2015 FINAL  Final  Fungus Culture with Smear     Status: None (Preliminary result)   Collection Time: 10/02/15  8:39 AM  Result Value Ref Range Status   Specimen Description FLUID PLEURAL LEFT  Final   Special Requests SPEC B  Final   Fungal Smear   Final    NO YEAST OR FUNGAL ELEMENTS SEEN Performed at [facility]    Culture   Final    CULTURE IN PROGRESS FOR FOUR WEEKS Performed at [facility]    Report Status PENDING  Incomplete  AFB culture with smear      Status: None (Preliminary result)   Collection Time: 10/02/15  8:39 AM  Result Value Ref Range Status   Specimen Description FLUID PLEURAL LEFT  Final   Special Requests SPEC B  Final   Acid Fast Smear   Final    NO ACID FAST BACILLI SEEN Performed at [facility]    Culture   Final    CULTURE WILL BE EXAMINED FOR 6 WEEKS BEFORE ISSUING A FINAL REPORT Performed at [facility]    Report Status PENDING  Incomplete  Culture, body fluid-bottle     Status: None   Collection Time: 10/02/15  8:39 AM  Result Value Ref Range Status   Specimen Description FLUID LEFT PLEURAL  Final   Special Requests BOTTLES DRAWN AEROBIC AND ANAEROBIC 5CCS  Final   Culture NO GROWTH 5 DAYS  Final   Report Status 10/07/2015 FINAL  Final  Gram stain     Status: None   Collection Time: 10/02/15  8:39 AM  Result Value Ref Range Status   Specimen Description FLUID LEFT PLEURAL  Final   Special Requests NONE  Final   Gram Stain   Final    DEGENERATED CELLULAR MATERIAL PRESENT NO ORGANISMS SEEN    Report Status 10/02/2015 FINAL  Final  Anaerobic culture     Status: None   Collection Time: 10/02/15  8:50 AM  Result Value Ref Range Status   Specimen Description FLUID PLEURAL LEFT  Final   Special Requests SPEC C ON SWAB  Final   Gram Stain   Final    MODERATE WBC PRESENT,BOTH PMN AND MONONUCLEAR NO ORGANISMS SEEN    Culture   Final    NO ANAEROBES ISOLATED; CULTURE IN PROGRESS FOR 5 DAYS   Report Status 10/07/2015 FINAL  Final  Body fluid culture     Status: None   Collection Time: 10/02/15  8:50 AM  Result Value Ref Range Status   Specimen Description FLUID PLEURAL LEFT  Final   Special Requests SPEC C ON SWAB  Final   Gram Stain   Final    MODERATE WBC PRESENT,BOTH PMN AND MONONUCLEAR NO ORGANISMS SEEN    Culture NO GROWTH 3 DAYS  Final   Report Status 10/05/2015 FINAL  Final  Anaerobic culture     Status: None   Collection Time: 10/02/15  8:53 AM  Result Value Ref Range Status    Specimen Description TISSUE LUNG LEFT  Final   Special Requests SPEC D  Final   Gram Stain   Final    NO WBC SEEN NO ORGANISMS SEEN Performed at [facility]   Final  NO ANAEROBES ISOLATED Performed at [facility]    Report Status 10/07/2015 FINAL  Final  Tissue culture     Status: None   Collection Time: 10/02/15  8:53 AM  Result Value Ref Range Status   Specimen Description TISSUE LUNG LEFT  Final   Special Requests SPEC D PER ALLYSA HEGARTY UNABLE TO MODIFY IN EPIC  Final   Gram Stain   Final    NO WBC SEEN NO ORGANISMS SEEN Performed at [facility]    Culture   Final    NO GROWTH 3 DAYS Performed at [facility]    Report Status 10/06/2015 FINAL  Final  Culture, blood (routine x 2)     Status: None   Collection Time: 10/10/15 10:54 PM  Result Value Ref Range Status   Specimen Description BLOOD RIGHT ANTECUBITAL  Final   Special Requests   Final    BOTTLES DRAWN AEROBIC AND ANAEROBIC 6CC BLUE 5CC PURPLE   Culture NO GROWTH 5 DAYS  Final   Report Status 10/15/2015 FINAL  Final  Culture, blood (routine x 2)     Status: None   Collection Time: 10/10/15 11:01 PM  Result Value Ref Range Status   Specimen Description BLOOD LEFT ANTECUBITAL  Final   Special Requests   Final    BOTTLES DRAWN AEROBIC AND ANAEROBIC 6CC BLUE 5CC PURPLE   Culture NO GROWTH 5 DAYS  Final   Report Status 10/15/2015 FINAL  Final  Culture, expectorated sputum-assessment     Status: None   Collection Time: 10/11/15  4:14 AM  Result Value Ref Range Status   Specimen Description EXPECTORATED SPUTUM  Final   Special Requests NONE  Final   Sputum evaluation   Final    THIS SPECIMEN IS ACCEPTABLE. RESPIRATORY CULTURE REPORT TO FOLLOW.   Report Status 10/11/2015 FINAL  Final  Culture, respiratory (NON-Expectorated)     Status: None   Collection Time: 10/11/15  4:14 AM  Result Value Ref Range Status   Specimen Description EXPECTORATED SPUTUM  Final    Special Requests NONE  Final   Gram Stain   Final    MODERATE WBC PRESENT, PREDOMINANTLY PMN MODERATE SQUAMOUS EPITHELIAL CELLS PRESENT ABUNDANT GRAM POSITIVE COCCI IN PAIRS IN CHAINS IN CLUSTERS FEW GRAM NEGATIVE RODS Performed at [facility]    Culture   Final    NORMAL OROPHARYNGEAL FLORA Performed at [facility]    Report Status 10/13/2015 FINAL  Final  C difficile quick scan w PCR reflex     Status: None   Collection Time: 10/13/15 12:50 PM  Result Value Ref Range Status   C Diff antigen NEGATIVE NEGATIVE Final   C Diff toxin NEGATIVE NEGATIVE Final   C Diff interpretation Negative for toxigenic C. difficile  Final    Medical History: Past Medical History  Diagnosis Date  . Kidney stones   . Hypertension     Medications:  Scheduled:  . pantoprazole  40 mg Oral Daily  . piperacillin-tazobactam (ZOSYN)  IV  3.375 g Intravenous 3 times per day  . vancomycin  1,000 mg Intravenous Q24H     Assessment: 54 yo male s/p VATS, decorticaion, drainage of empyema on 10-02-15 with fever continuing on vancomycin and zosyn for empyema. WBC down to 11.3, afeb. SCr trend up 0.9>>1.15, no new BMET today - follow-up in AM. ID following.  12/8 vanc>> 12/8 zosyn>>  12/9 c diff: neg 12/7 resp- normal flora 12/6 blood x2- ngf 11/28  resp- H flu (beta-lac +) 11/28 L pleural fluid>>ngf 11/28 LL tissue>>ngf  Goal of Therapy:  Vancomycin trough level 15-20 mcg/ml  Plan:  -Zosyn 3.375gm IV q8h -Vancomycin 1000mg  IV q24h -Monitor clinical progress, c/s, renal function, abx plan/LOT -VT@SS  soon if continuing - monitor SCr trend -No BMET today - f/u in AM  Elicia Lamp, PharmD, [place] Pharmacist Pager [phone] 10/15/2015 12:45 PM

## 2015-10-15 NOTE — Progress Notes (Addendum)
[address]       [employer] [address]             [phone]      13 Days Post-Op Procedure(s) (LRB): VIDEO ASSISTED THORACOSCOPY (VATS)/DECORTICATION and drainage of chronic empyena (Left) VIDEO BRONCHOSCOPY (N/A) Subjective: tmax 100.3 Having some hemoptysis  Objective: Vital signs in last 24 hours: Temp:  [97.9 F (36.6 C)-100.3 F (37.9 C)] 97.9 F (36.6 C) (12/11 0536) Pulse Rate:  [84-98] 96 (12/11 0536) Cardiac Rhythm:  [-] Normal sinus rhythm (12/11 0705) Resp:  [16-18] 18 (12/11 0536) BP: (95-113)/(58-71) 95/58 mmHg (12/11 0536) SpO2:  [93 %-97 %] 93 % (12/11 0536)  Hemodynamic parameters for last 24 hours:    Intake/Output from previous day: 12/10 0701 - 12/11 0700 In: 240 [P.O.:240] Out: -  Intake/Output this shift:    General appearance: alert, cooperative and no distress Heart: regular rate and rhythm Lungs: dim right >left base Abdomen: benign Extremities: no edema Wound: incis healing well  Lab Results:  Recent Labs  10/13/15 0226 10/14/15 0315  WBC 19.0* 11.3*  HGB 11.9* 10.7*  HCT 34.9* 32.4*  PLT 371 364   BMET:  Recent Labs  10/14/15 0315  NA 135  K 3.5  CL 98*  CO2 29  GLUCOSE 111*  BUN 15  CREATININE 1.15  CALCIUM 8.4*    PT/INR: No results for input(s): LABPROT, INR in the last 72 hours. ABG    Component Value Date/Time   PHART 7.331* 10/03/2015 0317   HCO3 31.4* 10/03/2015 0317   TCO2 33.2 10/03/2015 0317   O2SAT 99.3 10/03/2015 0317   CBG (last 3)  No results for input(s): GLUCAP in the last 72 hours. Results for orders placed or performed during the hospital encounter of 10/02/15  Surgical pcr screen     Status: None   Collection Time: 10/02/15  6:48 AM  Result Value Ref Range Status   MRSA, PCR NEGATIVE NEGATIVE Final   Staphylococcus aureus NEGATIVE NEGATIVE Final    Comment:        The Xpert SA Assay (FDA approved for NASAL specimens in patients over 53 years of age), is one  component of a comprehensive surveillance program.  Test performance has been validated by [facility] ([facility]) for patients greater than or equal to 83 year old. It is not intended to diagnose infection nor to guide or monitor treatment.   AFB culture with smear     Status: None (Preliminary result)   Collection Time: 10/02/15  7:47 AM  Result Value Ref Range Status   Specimen Description BRONCHIAL WASHINGS LEFT  Final   Special Requests SPEC A  Final   Acid Fast Smear   Final    NO ACID FAST BACILLI SEEN Performed at [facility]    Culture   Final    CULTURE WILL BE EXAMINED FOR 6 WEEKS BEFORE ISSUING A FINAL REPORT Performed at [facility]    Report Status PENDING  Incomplete  Culture, respiratory (NON-Expectorated)     Status: None   Collection Time: 10/02/15  7:47 AM  Result Value Ref Range Status   Specimen Description BRONCHIAL WASHINGS LEFT  Final   Special Requests SPEC A  Final   Gram Stain   Final    NO WBC SEEN NO SQUAMOUS EPITHELIAL CELLS SEEN NO ORGANISMS SEEN Performed at [facility]    Culture   Final    FEW HAEMOPHILUS INFLUENZAE Note: BETA LACTAMASE  POSITIVE Performed at [facility]    Report Status 10/05/2015 FINAL  Final  Fungus Culture with Smear     Status: None (Preliminary result)   Collection Time: 10/02/15  8:39 AM  Result Value Ref Range Status   Specimen Description FLUID PLEURAL LEFT  Final   Special Requests SPEC B  Final   Fungal Smear   Final    NO YEAST OR FUNGAL ELEMENTS SEEN Performed at [facility]    Culture   Final    CULTURE IN PROGRESS FOR FOUR WEEKS Performed at [facility]    Report Status PENDING  Incomplete  AFB culture with smear     Status: None (Preliminary result)   Collection Time: 10/02/15  8:39 AM  Result Value Ref Range Status   Specimen Description FLUID PLEURAL LEFT  Final   Special Requests SPEC B  Final   Acid Fast Smear   Final    NO ACID FAST BACILLI  SEEN Performed at [facility]    Culture   Final    CULTURE WILL BE EXAMINED FOR 6 WEEKS BEFORE ISSUING A FINAL REPORT Performed at [facility]    Report Status PENDING  Incomplete  Culture, body fluid-bottle     Status: None   Collection Time: 10/02/15  8:39 AM  Result Value Ref Range Status   Specimen Description FLUID LEFT PLEURAL  Final   Special Requests BOTTLES DRAWN AEROBIC AND ANAEROBIC 5CCS  Final   Culture NO GROWTH 5 DAYS  Final   Report Status 10/07/2015 FINAL  Final  Gram stain     Status: None   Collection Time: 10/02/15  8:39 AM  Result Value Ref Range Status   Specimen Description FLUID LEFT PLEURAL  Final   Special Requests NONE  Final   Gram Stain   Final    DEGENERATED CELLULAR MATERIAL PRESENT NO ORGANISMS SEEN    Report Status 10/02/2015 FINAL  Final  Anaerobic culture     Status: None   Collection Time: 10/02/15  8:50 AM  Result Value Ref Range Status   Specimen Description FLUID PLEURAL LEFT  Final   Special Requests SPEC C ON SWAB  Final   Gram Stain   Final    MODERATE WBC PRESENT,BOTH PMN AND MONONUCLEAR NO ORGANISMS SEEN    Culture   Final    NO ANAEROBES ISOLATED; CULTURE IN PROGRESS FOR 5 DAYS   Report Status 10/07/2015 FINAL  Final  Body fluid culture     Status: None   Collection Time: 10/02/15  8:50 AM  Result Value Ref Range Status   Specimen Description FLUID PLEURAL LEFT  Final   Special Requests SPEC C ON SWAB  Final   Gram Stain   Final    MODERATE WBC PRESENT,BOTH PMN AND MONONUCLEAR NO ORGANISMS SEEN    Culture NO GROWTH 3 DAYS  Final   Report Status 10/05/2015 FINAL  Final  Anaerobic culture     Status: None   Collection Time: 10/02/15  8:53 AM  Result Value Ref Range Status   Specimen Description TISSUE LUNG LEFT  Final   Special Requests SPEC D  Final   Gram Stain   Final    NO WBC SEEN NO ORGANISMS SEEN Performed at [facility]    Culture   Final    NO ANAEROBES ISOLATED Performed at  [facility]    Report Status 10/07/2015 FINAL  Final  Tissue culture  Status: None   Collection Time: 10/02/15  8:53 AM  Result Value Ref Range Status   Specimen Description TISSUE LUNG LEFT  Final   Special Requests SPEC D PER ALLYSA HEGARTY UNABLE TO MODIFY IN EPIC  Final   Gram Stain   Final    NO WBC SEEN NO ORGANISMS SEEN Performed at [facility]    Culture   Final    NO GROWTH 3 DAYS Performed at [facility]    Report Status 10/06/2015 FINAL  Final  Culture, blood (routine x 2)     Status: None (Preliminary result)   Collection Time: 10/10/15 10:54 PM  Result Value Ref Range Status   Specimen Description BLOOD RIGHT ANTECUBITAL  Final   Special Requests   Final    BOTTLES DRAWN AEROBIC AND ANAEROBIC 6CC BLUE 5CC PURPLE   Culture NO GROWTH 4 DAYS  Final   Report Status PENDING  Incomplete  Culture, blood (routine x 2)     Status: None (Preliminary result)   Collection Time: 10/10/15 11:01 PM  Result Value Ref Range Status   Specimen Description BLOOD LEFT ANTECUBITAL  Final   Special Requests   Final    BOTTLES DRAWN AEROBIC AND ANAEROBIC 6CC BLUE 5CC PURPLE   Culture NO GROWTH 4 DAYS  Final   Report Status PENDING  Incomplete  Culture, expectorated sputum-assessment     Status: None   Collection Time: 10/11/15  4:14 AM  Result Value Ref Range Status   Specimen Description EXPECTORATED SPUTUM  Final   Special Requests NONE  Final   Sputum evaluation   Final    THIS SPECIMEN IS ACCEPTABLE. RESPIRATORY CULTURE REPORT TO FOLLOW.   Report Status 10/11/2015 FINAL  Final  Culture, respiratory (NON-Expectorated)     Status: None   Collection Time: 10/11/15  4:14 AM  Result Value Ref Range Status   Specimen Description EXPECTORATED SPUTUM  Final   Special Requests NONE  Final   Gram Stain   Final    MODERATE WBC PRESENT, PREDOMINANTLY PMN MODERATE SQUAMOUS EPITHELIAL CELLS PRESENT ABUNDANT GRAM POSITIVE COCCI IN PAIRS IN CHAINS IN CLUSTERS  FEW GRAM NEGATIVE RODS Performed at [facility]    Culture   Final    NORMAL OROPHARYNGEAL FLORA Performed at [facility]    Report Status 10/13/2015 FINAL  Final  C difficile quick scan w PCR reflex     Status: None   Collection Time: 10/13/15 12:50 PM  Result Value Ref Range Status   C Diff antigen NEGATIVE NEGATIVE Final   C Diff toxin NEGATIVE NEGATIVE Final   C Diff interpretation Negative for toxigenic C. difficile  Final   Meds Scheduled Meds: . pantoprazole  40 mg Oral Daily  . piperacillin-tazobactam (ZOSYN)  IV  3.375 g Intravenous 3 times per day  . vancomycin  1,000 mg Intravenous Q24H   Continuous Infusions: . 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 Chest tube removed. EXAM: CHEST  2 VIEW COMPARISON:  10/12/2015 FINDINGS: Persistent loculated left hydro pneumothorax compatible with chronic empyema. Small left pleural air-fluid levels persist. Residual atelectasis and airspace disease in the left lung. No significant change in aeration. Stable heart size. Pulmonary arteries appear enlarged. Trace right  effusion as before. Stable right lung aeration. Trachea is midline. No significant interval change. IMPRESSION: Stable loculated left hydro pneumothorax compatible with empyema and residual patchy left lung airspace process versus atelectasis. No significant change. Stable trace right effusion Electronically Signed   By: Jerilynn Mages.  Shick M.D.   On: 10/14/2015 09:02    Assessment/Plan: S/P Procedure(s) (LRB): VIDEO ASSISTED THORACOSCOPY (VATS)/DECORTICATION and drainage of chronic empyena (Left) VIDEO BRONCHOSCOPY (N/A)  1 improving , defervescing on currebt abx 2 blood tinged sputum  cont to monitor  3 may get repeat CT scan   LOS: 13 days    GOLD,[name] 10/15/2015  Follow up chest xray in am Temp curve better Still with loose stools I have seen and examined [name] [name] and agree with the above assessment  and plan.  [name] MD Beeper [phone] Office [phone] 10/15/2015 10:09 AM

## 2015-10-16 ENCOUNTER — Inpatient Hospital Stay (HOSPITAL_COMMUNITY): Payer: BLUE CROSS/BLUE SHIELD

## 2015-10-16 LAB — COMPREHENSIVE METABOLIC PANEL
ALT: 53 U/L (ref 17–63)
AST: 46 U/L — ABNORMAL HIGH (ref 15–41)
Albumin: 2 g/dL — ABNORMAL LOW (ref 3.5–5.0)
Alkaline Phosphatase: 77 U/L (ref 38–126)
Anion gap: 7 (ref 5–15)
BUN: 9 mg/dL (ref 6–20)
CO2: 30 mmol/L (ref 22–32)
Calcium: 8.3 mg/dL — ABNORMAL LOW (ref 8.9–10.3)
Chloride: 98 mmol/L — ABNORMAL LOW (ref 101–111)
Creatinine, Ser: 1.09 mg/dL (ref 0.61–1.24)
GFR calc Af Amer: >60 mL/min (ref >60)
GFR calc non Af Amer: >60 mL/min (ref >60)
Glucose, Bld: 112 mg/dL — ABNORMAL HIGH (ref 65–99)
Potassium: 3.8 mmol/L (ref 3.5–5.1)
Sodium: 135 mmol/L (ref 135–145)
Total Bilirubin: 0.7 mg/dL (ref 0.3–1.2)
Total Protein: 7.4 g/dL (ref 6.5–8.1)

## 2015-10-16 LAB — CBC
HCT: 33.2 % — ABNORMAL LOW (ref 39.0–52.0)
Hemoglobin: 11 g/dL — ABNORMAL LOW (ref 13.0–17.0)
MCH: 28.5 pg (ref 26.0–34.0)
MCHC: 33.1 g/dL (ref 30.0–36.0)
MCV: 86 fL (ref 78.0–100.0)
Platelets: 406 10*3/uL — ABNORMAL HIGH (ref 150–400)
RBC: 3.86 MIL/uL — ABNORMAL LOW (ref 4.22–5.81)
RDW: 12.6 % (ref 11.5–15.5)
WBC: 10.1 10*3/uL (ref 4.0–10.5)

## 2015-10-16 LAB — PROTIME-INR
INR: 1.18 (ref 0.00–1.49)
PROTHROMBIN TIME: 15.2 s (ref 11.6–15.2)

## 2015-10-16 LAB — APTT: aPTT: 34 seconds (ref 24–37)

## 2015-10-16 MED ORDER — POTASSIUM CHLORIDE CRYS ER 20 MEQ PO TBCR
20.0000 meq | EXTENDED_RELEASE_TABLET | Freq: Once | ORAL | Status: AC
  Administered 2015-10-16: 20 meq via ORAL
  Filled 2015-10-16: qty 1

## 2015-10-16 NOTE — Consult Note (Signed)
Chief Complaint: Patient was seen in consultation today for Left loculated pleural effusion drain placement at the request of Dr Servando Snare  Referring Physician(s): Dr Servando Snare  History of Present Illness: [name] [name] is a 54 y.o. male   Pt with chronic left loculated pleural effusion Known past asbestos exposure VATS 2 weeks ago All cxs neg Biopsy Left pleural mass 11/28 Neg Continues with cough and minimal hemoptysis T max 100.1 CXR 12./12:  IMPRESSION: Stable loculated left hydro pneumothorax and left lung airspace disease.  Request per Dr Servando Snare for chest tube placement Dr Vernard Gambles has reviewed imaging and approves procedure I have seen and examined pt  Past Medical History  Diagnosis Date  . Kidney stones   . Hypertension     Past Surgical History  Procedure Laterality Date  . Kidney surgery    . Video assisted thoracoscopy (vats)/decortication Left 2003  . Video assisted thoracoscopy (vats)/decortication Left 10/02/2015    Procedure: VIDEO ASSISTED THORACOSCOPY (VATS)/DECORTICATION and drainage of chronic empyena;  Surgeon: [name], MD;  Location: [facility];  Service: Thoracic;  Laterality: Left;  . Video bronchoscopy N/A 10/02/2015    Procedure: VIDEO BRONCHOSCOPY;  Surgeon: [name], MD;  Location: [facility] OR;  Service: Thoracic;  Laterality: N/A;    Allergies: Review of patient's allergies indicates no known allergies.  Medications: Prior to Admission medications   Not on File     History reviewed. No pertinent family history.  Social History   Social History  . Marital Status: Single    Spouse Name: N/A  . Number of Children: N/A  . Years of Education: N/A   Social History Main Topics  . Smoking status: Never Smoker   . Smokeless tobacco: None  . Alcohol Use: Yes  . Drug Use: No  . Sexual Activity: Not Asked   Other Topics Concern  . None   Social History Narrative    Review of Systems: A 12 point ROS discussed and  pertinent positives are indicated in the HPI above.  All other systems are negative.  Review of Systems  Constitutional: Positive for fever, activity change, appetite change and fatigue.  Respiratory: Positive for cough and shortness of breath.   Gastrointestinal: Negative for nausea and vomiting.  Musculoskeletal: Negative for back pain.  Neurological: Positive for weakness.  Psychiatric/Behavioral: Negative for behavioral problems and confusion.    Vital Signs: BP 101/62 mmHg  Pulse 103  Temp(Src) 98.7 F (37.1 C) (Oral)  Resp 18  Ht 5' (1.524 m)  Wt 92 lb 15.8 oz (42.18 kg)  BMI 18.16 kg/m2  SpO2 91%  Physical Exam  Constitutional: He is oriented to person, place, and time.  Speaks and understands English well  Cardiovascular: Normal rate and regular rhythm.   Pulmonary/Chest: Effort normal and breath sounds normal. He has no wheezes.  Abdominal: Soft. Bowel sounds are normal. There is no tenderness.  Neurological: He is alert and oriented to person, place, and time.  Skin: Skin is warm and dry.  Psychiatric: He has a normal mood and affect. His behavior is normal. Judgment and thought content normal.  Nursing note and vitals reviewed.   Mallampati Score:  MD Evaluation Airway: WNL Heart: WNL Abdomen: WNL Chest/ Lungs: WNL ASA  Classification: 3 Mallampati/Airway Score: One  Imaging: Dg Chest 2 View  10/16/2015  CLINICAL DATA:  Empyema EXAM: CHEST  2 VIEW COMPARISON:  10/14/2015 FINDINGS: Stable loculated left hydro pneumothorax. Airspace disease throughout the left mid and lower lung also stable.  No confluent opacity on the right. Small right pleural effusion. Underlying hyperinflation. Heart is normal size. IMPRESSION: Stable loculated left hydro pneumothorax and left lung airspace disease. Electronically Signed   By: Rolm Baptise M.D.   On: 10/16/2015 07:31   Dg Chest 2 View  10/14/2015  CLINICAL DATA:  Chronic left empyema status post VATS and decortication.  Chest tube removed. EXAM: CHEST  2 VIEW COMPARISON:  10/12/2015 FINDINGS: Persistent loculated left hydro pneumothorax compatible with chronic empyema. Small left pleural air-fluid levels persist. Residual atelectasis and airspace disease in the left lung. No significant change in aeration. Stable heart size. Pulmonary arteries appear enlarged. Trace right effusion as before. Stable right lung aeration. Trachea is midline. No significant interval change. IMPRESSION: Stable loculated left hydro pneumothorax compatible with empyema and residual patchy left lung airspace process versus atelectasis. No significant change. Stable trace right effusion Electronically Signed   By: Jerilynn Mages.  Shick M.D.   On: 10/14/2015 09:02   Dg Chest 2 View  10/12/2015  CLINICAL DATA:  Left hydro pneumothorax. EXAM: CHEST  2 VIEW COMPARISON:  PA and lateral chest x-ray of October 11, 2015 and chest CT scan of the same date FINDINGS: On the left a small meniscus is visible in the upper hemi thorax. There is pleural fluid surrounding the periphery of the left lung. The right lung is well-expanded. There is a trace of pleural fluid blunting the costophrenic angles. There is stable scarring in the right lower hemi thorax. The heart is normal in size. The pulmonary vascularity is not engorged. The central pulmonary vascularity is prominent but stable. The trachea is midline. IMPRESSION: Stable appearance of the left-sided hydro pneumothorax/ gas containing empyema. Irregular pleural thickening-fluid along the periphery of the left lung consistent with clinically suspected empyema. Stable small right pleural effusion. Stable central pulmonary vascular prominence. Electronically Signed   By: David  Martinique M.D.   On: 10/12/2015 07:50   Dg Chest 2 View  10/11/2015  CLINICAL DATA:  Pneumothorax. EXAM: CHEST  2 VIEW COMPARISON:  10/10/2015.  10/08/2015 . FINDINGS: Mediastinum hilar structures are stable. Heart size stable. Prominent central pulmonary  arteries noted suggesting pulmonary hypertension. Chronic interstitial prominence consistent chronic interstitial lung disease. Stable left hydro pneumothorax. Stable right pleural thickening most consistent with scarring and/or stable small pleural effusion. No acute osseus abnormality . IMPRESSION: 1. Stable left hydro pneumothorax. 2. Chronic interstitial lung disease. Small right pleural effusion and/or pleural scarring. 3. Prominent central pulmonary arteries suggesting pulmonary hypertension. Electronically Signed   By: Marcello Moores  Register   On: 10/11/2015 07:41   Dg Chest 2 View  10/10/2015  CLINICAL DATA:  Chest tube. EXAM: CHEST  2 VIEW COMPARISON:  10/09/2015.  10/08/2015 . FINDINGS: Left chest tube in stable position. Mediastinum and hilar structures are stable. Heart size stable. Prominent central pulmonary arteries noted consistent with pulmonary hypertension. Stable loculated pneumothorax on the left. Stable left pleural fluid/ thickening. Small right pleural effusion. IMPRESSION: 1. Left chest tube in stable position with stable loculated left lateral pneumothorax and left-sided pleural fluid/ thickening. 2. Small right pleural effusion again noted. 3. Heart size stable. Stable prominent central pulmonary arteries consistent with pulmonary hypertension . Electronically Signed   By: Marcello Moores  Register   On: 10/10/2015 07:55   Dg Chest 2 View  09/30/2015  CLINICAL DATA:  Left pleural effusion. EXAM: CHEST  2 VIEW COMPARISON:  09/25/2015 chest radiograph FINDINGS: Stable cardiomediastinal silhouette with normal heart size. No pneumothorax. Stable small right pleural effusion. Loculated moderate  to large left pleural effusion, not appreciably changed. Re- demonstrated are calcified pleural plaques throughout the mid to lower pleural spaces bilaterally. Stable mild scarring versus atelectasis at the right lung base. Stable scarring versus atelectasis in the peripheral and basilar left lung. No new focal  lung opacity. IMPRESSION: 1. 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 Stable small right pleural effusion. Stable moderate to large loculated left pleural effusion. 2. Stable scarring versus atelectasis in the peripheral left lung and at both lung bases. Electronically Signed   By: Ilona Sorrel M.D.   On: 09/30/2015 10:19   Dg Chest 2 View  09/25/2015  CLINICAL DATA:  54 year old male with chest pain EXAM: CHEST  2 VIEW COMPARISON:  Prior chest x-ray 11/10/2012 FINDINGS: Compared to prior imaging there has been slight interval increase in the volume of the loculated left pleural effusion which now has a convex margin at the superior extent. There is associated atelectasis of the adjacent lung. Stable calcified pleural plaques in the right mid and lower thorax. Cardiac and mediastinal contours are unchanged. The main and central pulmonary arteries are markedly enlarged. No pneumothorax. No new focal airspace consolidation. Osseous structures are intact and unremarkable. IMPRESSION: Compared to 11/10/2012 the chronic loculated left pleural effusion has enlarged and developed more convex margins suggesting the possibility of superinfection or development of empyema. Otherwise, similar appearance of the chest with enlarged main and central pulmonary arteries suggesting pulmonary arterial hypertension and calcified pleural plaques bilaterally. Electronically Signed   By: Jacqulynn Cadet M.D.   On: 09/25/2015 20:08   Ct Chest Without Contrast  10/11/2015  CLINICAL DATA:  Postop fever. Status post video-assisted thorascopic/decortication and drainage of chronic left empyema. EXAM: CT CHEST WITHOUT CONTRAST TECHNIQUE: Multidetector CT imaging of the chest was performed following the standard protocol without IV contrast. COMPARISON:  09/26/2015. FINDINGS: Mediastinum: Mild cardiac enlargement is noted. There is aortic atherosclerosis identified. The trachea appears patent and is  midline. Unremarkable appearance of the esophagus. No significant mediastinal or hilar adenopathy. The main pulmonary artery appears increased in diameter measuring 4.6 cm. Findings are worrisome for PA hypertension. Lungs/Pleura: Bilateral pleural calcifications are again noted. Loculated left pleural fluid collection is again identified. On the current exam there is a fluid level identified within the loculation overlying the posterior lateral left upper lobe. Overlying the left lower lung zone is gas an intermediate attenuation fluid which is worrisome for persistent/recurrent empyema, image number 46 of series 2. A small gas containing tract is identified within the anterior and lateral left chest wall, image 43/series 2. Bilateral areas of bronchial wall thickening identified. Scarring is noted within the right lung base. Upper Abdomen: No focal liver abnormality identified. Splenic granulomas are noted. Musculoskeletal: No acute bone abnormality identified. IMPRESSION: 1. 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 2. Gas containing tract extends from the loculated left effusion through the anterior and lateral left chest wall. Pleuro cutaneous fistula cannot be excluded. 3. Chronic bilateral pleural calcifications. 4. Aortic atherosclerosis 5. Increase caliber of the pulmonary arteries worrisome for PA hypertension. Electronically Signed   By: Kerby Moors M.D.   On: 10/11/2015 19:54   Ct Angio Chest Pe W/cm &/or Wo Cm  09/26/2015  CLINICAL DATA:  Subacute onset of intermittent progressive shortness of breath, and dyspnea on exertion and rest. Midsternal chest burning. Initial encounter. EXAM: CT ANGIOGRAPHY CHEST WITH CONTRAST TECHNIQUE: Multidetector CT imaging of the chest was performed using the standard protocol during bolus administration of intravenous  contrast. Multiplanar CT image reconstructions and MIPs were obtained to evaluate the vascular anatomy. CONTRAST:   30mL OMNIPAQUE IOHEXOL 350 MG/ML SOLN COMPARISON:  Chest radiograph performed 09/25/2015, and CT of the chest performed 04/17/2011 FINDINGS: There is no evidence of pulmonary embolus. The patient's loculated complex left-sided pleural effusion has increased mildly in size, with minimal air in the collection, raising concern for superinfection or evolving empyema. Scattered calcified pleural plaques raise question for underlying prior asbestos exposure. Mild scarring is again noted at the right lung base. Mild emphysematous change is noted at the upper lung lobes. There is no evidence of pneumothorax. No masses are identified; no abnormal focal contrast enhancement is seen. There is dilatation of the main pulmonary artery, measuring 4.8 cm in diameter, raising question for underlying pulmonary arterial hypertension. No pericardial effusion is identified. The great vessels are grossly unremarkable. No mediastinal lymphadenopathy is seen. No axillary lymphadenopathy is seen. The visualized portions of the thyroid gland are unremarkable in appearance. There is reflux of contrast into the hepatic veins and IVC. The visualized portions of the liver and spleen are unremarkable. The visualized portions of the pancreas, gallbladder, stomach, adrenal glands and kidneys are within normal limits. No acute osseous abnormalities are seen. Review of the MIP images confirms the above findings. IMPRESSION: 1. No evidence of pulmonary embolus. 2. Loculated complex left-sided pleural effusion has increased mildly in size, with minimal air now seen in the collection, raising concern for superinfection or evolving empyema. Would correlate clinically. 3. Dilatation of the main pulmonary artery, measuring 4.8 cm in diameter, raising question for underlying pulmonary arterial hypertension. 4. Scattered calcified pleural plaques raise question for underlying prior asbestos exposure. Mild scarring at the right lung base. 5. Mild  emphysematous change at the upper lung lobes. Electronically Signed   By: Garald Balding M.D.   On: 09/26/2015 00:57   Ct Biopsy  10/02/2015  CLINICAL DATA:  Left pleural mass EXAM: CT-GUIDED BIOPSY OF A LEFT PLEURAL MASS. MEDICATIONS AND MEDICAL HISTORY: Versed 2 mg, Fentanyl 100 mcg. Additional Medications: None. ANESTHESIA/SEDATION: Moderate sedation time: 15 minutes PROCEDURE: The procedure, risks, benefits, and alternatives were explained to the patient. Questions regarding the procedure were encouraged and answered. The patient understands and consents to the procedure. The back was prepped with Betadine in a sterile fashion, and a sterile drape was applied covering the operative field. A sterile gown and sterile gloves were used for the procedure. Under CT guidance, a(n) 17 gauge guide needle was advanced into the left pleural mass. Aspiration did not yield any fluid. Subsequently four 18 gauge core biopsies were obtained. One was placed in saline for tissue culture. The guide needle was removed. Final imaging was performed. Patient tolerated the procedure well without complication. Vital sign monitoring by nursing staff during the procedure will continue as patient is in the special procedures unit for post procedure observation. FINDINGS: The images document guide needle placement within the left pleural mass. Post biopsy images demonstrate no hemorrhage. COMPLICATIONS: None IMPRESSION: Successful CT-guided left pleural mass biopsy for tissue culture and psychological analysis. Electronically Signed   By: Marybelle Killings M.D.   On: 10/02/2015 09:09   Dg Chest Port 1 View  10/10/2015  CLINICAL DATA:  Acute onset of fever. Status post removal of left-sided chest tube. Initial encounter. EXAM: PORTABLE CHEST 1 VIEW COMPARISON:  Chest radiograph performed earlier today at 6:40 a.m. FINDINGS: A mildly increased loculated small left pleural effusion is noted. The previously noted underlying loculated  pneumothorax  is less apparent. Calcification is noted at the right lung base. A small right pleural effusion is again noted. There is mild left-sided volume loss, with leftward mediastinal shift. The cardiomediastinal silhouette remains normal in size. No acute osseous abnormalities are seen. IMPRESSION: Mildly increased loculated small left pleural effusion; previously noted underlying small loculated pneumothorax is less apparent than on the prior study, status post removal of left-sided chest tube. Small right pleural effusion again noted. Electronically Signed   By: Garald Balding M.D.   On: 10/10/2015 22:37   Dg Chest Port 1 View  10/09/2015  CLINICAL DATA:  Left-sided pneumothorax EXAM: PORTABLE CHEST 1 VIEW COMPARISON:  October 08, 2015 FINDINGS: Chest tube remains on the left. The loculated pneumothorax on the left is stable. Surrounding loculated fluid/ pleural thickening remains. No new opacity is seen on the left. A small right pleural effusion remains. Pleural calcification is stable as well. Heart size is normal. The appearance of the pulmonary arteries is consistent with pulmonary arterial hypertension. IMPRESSION: No change from 1 day prior. Persistent loculated pneumothorax the left with pleural thickening/ effusion on the left. Small right pleural effusion. Stable areas of pleural calcification. Evidence of pulmonary arterial hypertension. No change in cardiac silhouette. Electronically Signed   By: Lowella Grip III M.D.   On: 10/09/2015 07:32   Dg Chest Port 1 View  10/08/2015  CLINICAL DATA:  Follow up pneumothorax. EXAM: PORTABLE CHEST 1 VIEW COMPARISON:  10/07/2015 and 10/06/2015. FINDINGS: 0600 hours. Peripheral left chest tube appears unchanged. The small residual peripherally loculated left pleural effusion is unchanged. There is minimal air in the pleural space adjacent to the chest tube, but no significant pneumothorax. Underlying parenchymal opacity at the left lung base appears  unchanged. There are bilateral pleural calcifications. The heart size and mediastinal contours are stable with marked central enlargement of the pulmonary arteries. IMPRESSION: Stable postoperative chest. Stable small residual loculated left pleural effusion. Electronically Signed   By: Richardean Sale M.D.   On: 10/08/2015 08:19   Dg Chest Port 1 View  10/07/2015  CLINICAL DATA:  Postop VATS 10/02/2015.  follow up pneumothorax. EXAM: PORTABLE CHEST 1 VIEW COMPARISON:  Radiographs 10/06/2015 and 10/05/2015. FINDINGS: 0808 hours. Left chest tube remains in place. No significant residual pneumothorax demonstrated. There is stable volume loss in the left hemithorax with residual loculated pleural fluid inferolaterally and mild left basilar airspace disease. There may be a small amount of pleural air near the chest tube insertion site. There is also a small amount of soft tissue emphysema within the left lateral chest wall. The right lung is clear. Pleural calcifications on the right are again noted. The heart size and mediastinal contours are stable with marked central enlargement of the pulmonary arteries attributed to pulmonary arterial hypertension. IMPRESSION: Stable loculated left pleural effusion status post decortication. No significant pneumothorax. Electronically Signed   By: Richardean Sale M.D.   On: 10/07/2015 10:16   Dg Chest Port 1 View  10/06/2015  CLINICAL DATA:  Left chest tube placement EXAM: PORTABLE CHEST 1 VIEW COMPARISON:  October 05, 2015 FINDINGS: There is a persistent small loculated pneumothorax on the left laterally, stable. Tube position on the left is unchanged. There is loculated pleural thickening and fluid on the left, stable. There is a small right pleural effusion. There are areas of pleural calcification on the right, stable. There is no frank airspace consolidation. Heart size is upper normal. There is prominence of the central pulmonary arteries with rapid peripheral tapering  suggesting a degree of pulmonary arterial hypertension. No adenopathy is appreciable. No bone lesions. IMPRESSION: Persistent small lateral loculated pneumothorax. No change in chest tube position. Stable left-sided pleural thickening/loculated fluid. No new opacity on the left. Small right pleural effusion, slightly increased from 1 day prior. Right lung otherwise clear. Evidence of pulmonary arterial hypertension. Electronically Signed   By: Lowella Grip III M.D.   On: 10/06/2015 10:06   Dg Chest Port 1 View  10/05/2015  CLINICAL DATA:  Empyema EXAM: PORTABLE CHEST 1 VIEW COMPARISON:  Yesterday FINDINGS: Small (less than 10%) but increased loculated pneumothorax along the lateral left chest where there is chest tube in similar position. Loculated pleural fluid or thickening at this level is also stable. Stable heart size and mediastinal contours, including massive enlargement of the main pulmonary artery. Calcified pleural plaques. IMPRESSION: Increased but small loculated pneumothorax along the left chest wall. Stable left pleural fluid/thickening. Electronically Signed   By: Monte Fantasia M.D.   On: 10/05/2015 07:28   Dg Chest Port 1 View  10/04/2015  CLINICAL DATA:  Empyema. EXAM: PORTABLE CHEST 1 VIEW COMPARISON:  10/03/2015 FINDINGS: No change in the pleural and parenchymal disease in the left chest. Stable position of the left chest tube. Negative for pneumothorax. Again noted is an enlargement of the pulmonary artery vasculature. Cardiomediastinal silhouette is grossly unchanged. IMPRESSION: Stable pleural and parenchymal disease in the left chest and stable position of the left chest tube. No pneumothorax. Persistent enlargement of the pulmonary arteries. Electronically Signed   By: Markus Daft M.D.   On: 10/04/2015 07:38   Dg Chest Port 1 View  10/03/2015  ADDENDUM REPORT: 10/03/2015 07:50 ADDENDUM: Left chest wall subcutaneous emphysema has improved. Electronically Signed   By: Marcello Moores   Register   On: 10/03/2015 07:50  10/03/2015  CLINICAL DATA:  Pneumothorax. EXAM: PORTABLE CHEST 1 VIEW COMPARISON:  10/02/2015. FINDINGS: Left chest tube in stable position. No pneumothorax. Stable cardiomegaly. Persistent left base subsegmental atelectasis and or infiltrate. Persistent left pleural thickening. Calcified pleural plaques. IMPRESSION: 1. Stable chest with stable positioning of left chest tube. No pneumothorax. 2.  Persistent left base atelectasis and or infiltrate. 3. Persistent mild left pleural effusion and/or thickening. Calcified pleural plaques again noted . Electronically Signed: By: Marcello Moores  Register On: 10/03/2015 07:41   Dg Chest Port 1 View  10/02/2015  CLINICAL DATA:  54 year old male status post left VATS. Initial encounter. EXAM: PORTABLE CHEST 1 VIEW COMPARISON:  09/30/2015 and earlier. FINDINGS: Portable AP semi upright view at 1122 hours. Left chest tube in place. Significantly regressed peripheral opacity in the left hemi thorax. Small volume left chest wall subcutaneous gas. No left pneumothorax identified. Lower lung volumes. Stable cardiac size and mediastinal contours. Calcified pleural plaques re- demonstrated. Stable right lung. IMPRESSION: 1. Left chest tube placed with significantly regressed loculated effusion/empyema. No left pneumothorax identified. Small volume subcutaneous gas. 2. Lower lung volumes.  Chronic calcified pleural plaques. Electronically Signed   By: Genevie Ann M.D.   On: 10/02/2015 11:33   Dg Abd Portable 1v  10/12/2015  CLINICAL DATA:  Pt complains of feeling constipated. Diarrhea and fever today. EXAM: PORTABLE ABDOMEN - 1 VIEW COMPARISON:  Chest x-ray 10/12/2015 FINDINGS: Bowel gas pattern is nonobstructive. No evidence for free intraperitoneal air on this supine view. No significant stool burden detected. No abnormal calcifications or evidence for organomegaly. Visualized osseous structures have a normal appearance. IMPRESSION: No evidence for acute   abnormality. Electronically Signed   By: Benjamine Mola  Owens Shark M.D.   On: 10/12/2015 14:32    Labs:  CBC:  Recent Labs  10/11/15 1044 10/13/15 0226 10/14/15 0315 10/16/15 0233  WBC 14.6* 19.0* 11.3* 10.1  HGB 12.8* 11.9* 10.7* 11.0*  HCT 38.9* 34.9* 32.4* 33.2*  PLT 385 371 364 406*    COAGS:  Recent Labs  09/26/15 1437  INR 1.16  APTT 32    BMP:  Recent Labs  10/04/15 0520 10/11/15 1044 10/14/15 0315 10/16/15 0233  NA 136 134* 135 135  K 4.6 4.0 3.5 3.8  CL 99* 98* 98* 98*  CO2 32 28 29 30   GLUCOSE 90 134* 111* 112*  BUN 6 11 15 9   CALCIUM 7.9* 8.6* 8.4* 8.3*  CREATININE 0.88 0.90 1.15 1.09  GFRNONAA >60 >60 >60 >60  GFRAA >60 >60 >60 >60    LIVER FUNCTION TESTS:  Recent Labs  09/25/15 2106 09/30/15 1630 10/04/15 0520 10/16/15 0233  BILITOT 0.5 0.3 0.3 0.7  AST 30 55* 39 46*  ALT 22 36 41 53  ALKPHOS 80 95 51 77  PROT 8.9* 11.3* 6.6 7.4  ALBUMIN 2.7* 3.6 2.1* 2.0*    TUMOR MARKERS: No results for input(s): AFPTM, CEA, CA199, CHROMGRNA in the last 8760 hours.  Assessment and Plan:  Chronic left loculated pleural effusion Now scheduled for chest tube drain placement Risks and Benefits discussed with the patient including bleeding, infection, damage to adjacent structures,pneumothorax  and sepsis. All of the patient's questions were answered, patient is agreeable to proceed. Consent signed and in chart.   Thank you for this interesting consult.  I greatly enjoyed meeting Juergen Sebree and look forward to participating in their care.  A copy of this report was sent to the requesting provider on this date.  Signed: Nolie Bignell A 10/16/2015, 9:04 AM   I spent a total of 40 Minutes    in face to face in clinical consultation, greater than 50% of which was counseling/coordinating care for left chest tube drain placement

## 2015-10-16 NOTE — Progress Notes (Signed)
Patient ID: [name] [name], male   DOB: 01/28/1961, 54 y.o.   MRN: [id number]   L loculated pleural effusion chest tube drain has been rescheduled to 12/13 am RN aware---will inform pt NPO after MN tonight New orders in place We will call for pt in am Consent signed and in chart

## 2015-10-16 NOTE — Progress Notes (Addendum)
      [address]       [place] [address]             [phone]       14 Days Post-Op Procedure(s) (LRB): VIDEO ASSISTED THORACOSCOPY (VATS)/DECORTICATION and drainage of chronic empyena (Left) VIDEO BRONCHOSCOPY (N/A)  Subjective: Patient states still has loose stool and hemoptysis, and a fair amount of left sided "chest pain"  Objective: Vital signs in last 24 hours: Temp:  [98.7 F (37.1 C)-100.1 F (37.8 C)] 98.7 F (37.1 C) (12/12 0428) Pulse Rate:  [98-104] 103 (12/12 0428) Cardiac Rhythm:  [-] Sinus tachycardia (12/11 1900) Resp:  [18] 18 (12/12 0428) BP: (101-135)/(62-86) 101/62 mmHg (12/12 0428) SpO2:  [91 %-97 %] 91 % (12/12 0428)     Intake/Output from previous day: 12/11 0701 - 12/12 0700 In: 240 [P.O.:240] Out: -    Physical Exam:  Cardiovascular: RRR Pulmonary: Clear on right and diminished left base. Abdomen: Soft, non tender, bowel sounds present. Wounds: Clean and dry.  No erythema or signs of infection.   Lab Results: CBC:  Recent Labs  10/14/15 0315 10/16/15 0233  WBC 11.3* 10.1  HGB 10.7* 11.0*  HCT 32.4* 33.2*  PLT 364 406*   BMET:   Recent Labs  10/14/15 0315 10/16/15 0233  NA 135 135  K 3.5 3.8  CL 98* 98*  CO2 29 30  GLUCOSE 111* 112*  BUN 15 9  CREATININE 1.15 1.09  CALCIUM 8.4* 8.3*    PT/INR: No results for input(s): LABPROT, INR in the last 72 hours. ABG:  INR: Will add last result for INR, ABG once components are confirmed Will add last 4 CBG results once components are confirmed  Assessment/Plan:  1. CV - SR in the 80's. 2.  Pulmonary - CXR this am appears stable (left hydro pneumothorax, small right pleural effusion). IR to place left drainage tube 3. Fever to 100.1 last evening. WBC "WNL" 10,100.  Has been on Vanco and Zosyn since 12/8. 4. H and H stable at 11 and 33.2 5. Supplement potassium 6. Loose stools-C. Dif negative. Await stool panel 7. Will discuss work up of hemoptysis  with Dr. Suzanne Boron MPA-C 10/16/2015,7:33 AM    Fever curve improving  For ct guided chest tube left today I have seen and examined [name] and agree with the above assessment  and plan.  [name] MD Beeper [phone] Office [phone] 10/16/2015 8:32 AM

## 2015-10-16 NOTE — Progress Notes (Signed)
INFECTIOUS DISEASE PROGRESS NOTE  ID: [name] [name] is a 54 y.o. male with  Active Problems:   Empyema, left (HCC)  Subjective: Occas cough.   Abtx:  Anti-infectives    Start     Dose/Rate Route Frequency Ordered Stop   10/12/15 2130  piperacillin-tazobactam (ZOSYN) IVPB 3.375 g     3.375 g 12.5 mL/hr over 240 Minutes Intravenous 3 times per day 10/12/15 2107     10/12/15 1300  vancomycin (VANCOCIN) IVPB 1000 mg/200 mL premix     1,000 mg 200 mL/hr over 60 Minutes Intravenous Every 24 hours 10/12/15 1202     10/12/15 1200  vancomycin (VANCOCIN) IVPB 750 mg/150 ml premix  Status:  Discontinued     750 mg 150 mL/hr over 60 Minutes Intravenous  Once 10/12/15 1151 10/12/15 1202   10/12/15 1200  piperacillin-tazobactam (ZOSYN) IVPB 3.375 g     3.375 g 100 mL/hr over 30 Minutes Intravenous  Once 10/12/15 1151 10/12/15 1338   10/02/15 2000  cefUROXime (ZINACEF) 1.5 g in dextrose 5 % 50 mL IVPB     1.5 g 100 mL/hr over 30 Minutes Intravenous Every 12 hours 10/02/15 1505 10/03/15 0756   10/02/15 0616  cefUROXime (ZINACEF) 1.5 g in dextrose 5 % 50 mL IVPB     1.5 g 100 mL/hr over 30 Minutes Intravenous 60 min pre-op 10/02/15 0616 10/02/15 0755      Medications:  Scheduled: . pantoprazole  40 mg Oral Daily  . piperacillin-tazobactam (ZOSYN)  IV  3.375 g Intravenous 3 times per day  . vancomycin  1,000 mg Intravenous Q24H    Objective: Vital signs in last 24 hours: Temp:  [98.7 F (37.1 C)-100.1 F (37.8 C)] 98.7 F (37.1 C) (12/12 0428) Pulse Rate:  [98-104] 103 (12/12 0428) Resp:  [18] 18 (12/12 0428) BP: (101-135)/(62-86) 101/62 mmHg (12/12 0428) SpO2:  [91 %-97 %] 91 % (12/12 0428)   General appearance: alert, cooperative and no distress Resp: diminished breath sounds posterior - left Cardio: regular rate and rhythm GI: normal findings: bowel sounds normal and soft, non-tender  Lab Results  Recent Labs  10/14/15 0315 10/16/15 0233  WBC 11.3* 10.1  HGB 10.7*  11.0*  HCT 32.4* 33.2*  NA 135 135  K 3.5 3.8  CL 98* 98*  CO2 29 30  BUN 15 9  CREATININE 1.15 1.09   Liver Panel  Recent Labs  10/16/15 0233  PROT 7.4  ALBUMIN 2.0*  AST 46*  ALT 53  ALKPHOS 77  BILITOT 0.7   Sedimentation Rate No results for input(s): ESRSEDRATE in the last 72 hours. C-Reactive Protein No results for input(s): CRP in the last 72 hours.  Microbiology: Recent Results (from the past 240 hour(s))  Culture, blood (routine x 2)     Status: None   Collection Time: 10/10/15 10:54 PM  Result Value Ref Range Status   Specimen Description BLOOD RIGHT ANTECUBITAL  Final   Special Requests   Final    BOTTLES DRAWN AEROBIC AND ANAEROBIC 6CC BLUE 5CC PURPLE   Culture NO GROWTH 5 DAYS  Final   Report Status 10/15/2015 FINAL  Final  Culture, blood (routine x 2)     Status: None   Collection Time: 10/10/15 11:01 PM  Result Value Ref Range Status   Specimen Description BLOOD LEFT ANTECUBITAL  Final   Special Requests   Final    BOTTLES DRAWN AEROBIC AND ANAEROBIC 6CC BLUE 5CC PURPLE   Culture NO GROWTH 5 DAYS  Final   Report Status 10/15/2015 FINAL  Final  Culture, expectorated sputum-assessment     Status: None   Collection Time: 10/11/15  4:14 AM  Result Value Ref Range Status   Specimen Description EXPECTORATED SPUTUM  Final   Special Requests NONE  Final   Sputum evaluation   Final    THIS SPECIMEN IS ACCEPTABLE. RESPIRATORY CULTURE REPORT TO FOLLOW.   Report Status 10/11/2015 FINAL  Final  Culture, respiratory (NON-Expectorated)     Status: None   Collection Time: 10/11/15  4:14 AM  Result Value Ref Range Status   Specimen Description EXPECTORATED SPUTUM  Final   Special Requests NONE  Final   Gram Stain   Final    MODERATE WBC PRESENT, PREDOMINANTLY PMN MODERATE SQUAMOUS EPITHELIAL CELLS PRESENT ABUNDANT GRAM POSITIVE COCCI IN PAIRS IN CHAINS IN CLUSTERS FEW GRAM NEGATIVE RODS Performed at [facility]    Culture   Final    NORMAL  OROPHARYNGEAL FLORA Performed at [facility]    Report Status 10/13/2015 FINAL  Final  C difficile quick scan w PCR reflex     Status: None   Collection Time: 10/13/15 12:50 PM  Result Value Ref Range Status   C Diff antigen NEGATIVE NEGATIVE Final   C Diff toxin NEGATIVE NEGATIVE Final   C Diff interpretation Negative for toxigenic C. difficile  Final    Studies/Results: Dg Chest 2 View  10/16/2015  CLINICAL DATA:  Empyema EXAM: CHEST  2 VIEW COMPARISON:  10/14/2015 FINDINGS: Stable loculated left hydro pneumothorax. Airspace disease throughout the left mid and lower lung also stable. No confluent opacity on the right. Small right pleural effusion. Underlying hyperinflation. Heart is normal size. IMPRESSION: Stable loculated left hydro pneumothorax and left lung airspace disease. Electronically Signed   By: Rolm Baptise M.D.   On: 10/16/2015 07:31     Assessment/Plan: Fever- improved Empyema Loose BM  Day 5 vanco/zosyn  Fever better loose stool persists (pathogen panel pending) c diff negative Sputum unremarkable.  For chest tube today  His specimen has been sent to [facility], will take several weeks.          Bobby Rumpf Infectious Diseases (pager) [phone] [email or web address] 10/16/2015, 9:43 AM  LOS: 14 days

## 2015-10-17 ENCOUNTER — Ambulatory Visit: Payer: BLUE CROSS/BLUE SHIELD | Admitting: Cardiothoracic Surgery

## 2015-10-17 ENCOUNTER — Inpatient Hospital Stay (HOSPITAL_COMMUNITY): Payer: BLUE CROSS/BLUE SHIELD

## 2015-10-17 LAB — GI PATHOGEN PANEL BY PCR, STOOL
C difficile toxin A/B: NOT DETECTED
CRYPTOSPORIDIUM BY PCR: NOT DETECTED
Campylobacter by PCR: NOT DETECTED
E coli (ETEC) LT/ST: NOT DETECTED
E coli (STEC): NOT DETECTED
E coli 0157 by PCR: NOT DETECTED
G LAMBLIA BY PCR: NOT DETECTED
NOROVIRUS G1/G2: NOT DETECTED
ROTAVIRUS A BY PCR: NOT DETECTED
SHIGELLA BY PCR: NOT DETECTED
Salmonella by PCR: NOT DETECTED

## 2015-10-17 LAB — VANCOMYCIN, TROUGH: Vancomycin Tr: 6 ug/mL — ABNORMAL LOW (ref 10.0–20.0)

## 2015-10-17 MED ORDER — MIDAZOLAM HCL 2 MG/2ML IJ SOLN
INTRAMUSCULAR | Status: AC
  Filled 2015-10-17: qty 2

## 2015-10-17 MED ORDER — MIDAZOLAM HCL 2 MG/2ML IJ SOLN
INTRAMUSCULAR | Status: AC | PRN
  Administered 2015-10-17: 0.5 mg via INTRAVENOUS

## 2015-10-17 MED ORDER — FENTANYL CITRATE (PF) 100 MCG/2ML IJ SOLN
INTRAMUSCULAR | Status: AC | PRN
  Administered 2015-10-17: 25 ug via INTRAVENOUS

## 2015-10-17 MED ORDER — VANCOMYCIN HCL IN DEXTROSE 1-5 GM/200ML-% IV SOLN
1000.0000 mg | Freq: Two times a day (BID) | INTRAVENOUS | Status: DC
  Administered 2015-10-17 – 2015-10-19 (×4): 1000 mg via INTRAVENOUS
  Filled 2015-10-17 (×6): qty 200

## 2015-10-17 MED ORDER — FENTANYL CITRATE (PF) 100 MCG/2ML IJ SOLN
INTRAMUSCULAR | Status: AC
  Filled 2015-10-17: qty 2

## 2015-10-17 MED ORDER — LIDOCAINE HCL 1 % IJ SOLN
INTRAMUSCULAR | Status: AC
  Filled 2015-10-17: qty 20

## 2015-10-17 NOTE — Progress Notes (Signed)
INFECTIOUS DISEASE PROGRESS NOTE  ID: [name] [name] is a 54 y.o. male with  Active Problems:   Empyema, left (HCC)  Subjective: C/o discomfort from new tube.   Abtx:  Anti-infectives    Start     Dose/Rate Route Frequency Ordered Stop   10/12/15 2130  piperacillin-tazobactam (ZOSYN) IVPB 3.375 g     3.375 g 12.5 mL/hr over 240 Minutes Intravenous 3 times per day 10/12/15 2107     10/12/15 1300  vancomycin (VANCOCIN) IVPB 1000 mg/200 mL premix     1,000 mg 200 mL/hr over 60 Minutes Intravenous Every 24 hours 10/12/15 1202     10/12/15 1200  vancomycin (VANCOCIN) IVPB 750 mg/150 ml premix  Status:  Discontinued     750 mg 150 mL/hr over 60 Minutes Intravenous  Once 10/12/15 1151 10/12/15 1202   10/12/15 1200  piperacillin-tazobactam (ZOSYN) IVPB 3.375 g     3.375 g 100 mL/hr over 30 Minutes Intravenous  Once 10/12/15 1151 10/12/15 1338   10/02/15 2000  cefUROXime (ZINACEF) 1.5 g in dextrose 5 % 50 mL IVPB     1.5 g 100 mL/hr over 30 Minutes Intravenous Every 12 hours 10/02/15 1505 10/03/15 0756   10/02/15 0616  cefUROXime (ZINACEF) 1.5 g in dextrose 5 % 50 mL IVPB     1.5 g 100 mL/hr over 30 Minutes Intravenous 60 min pre-op 10/02/15 0616 10/02/15 0755      Medications:  Scheduled: . fentaNYL      . lidocaine      . midazolam      . pantoprazole  40 mg Oral Daily  . piperacillin-tazobactam (ZOSYN)  IV  3.375 g Intravenous 3 times per day  . vancomycin  1,000 mg Intravenous Q24H    Objective: Vital signs in last 24 hours: Temp:  [98.5 F (36.9 C)-99.7 F (37.6 C)] 99.7 F (37.6 C) (12/13 0407) Pulse Rate:  [84-99] 88 (12/13 0913) Resp:  [12-118] 22 (12/13 0913) BP: (98-126)/(67-81) 109/78 mmHg (12/13 0913) SpO2:  [93 %-100 %] 96 % (12/13 0913)   General appearance: alert, cooperative and no distress Resp: diminished breath sounds posterior - left Cardio: regular rate and rhythm GI: normal findings: bowel sounds normal and soft, non-tender  Lab Results  Recent  Labs  10/16/15 0233  WBC 10.1  HGB 11.0*  HCT 33.2*  NA 135  K 3.8  CL 98*  CO2 30  BUN 9  CREATININE 1.09   Liver Panel  Recent Labs  10/16/15 0233  PROT 7.4  ALBUMIN 2.0*  AST 46*  ALT 53  ALKPHOS 77  BILITOT 0.7   Sedimentation Rate No results for input(s): ESRSEDRATE in the last 72 hours. C-Reactive Protein No results for input(s): CRP in the last 72 hours.  Microbiology: Recent Results (from the past 240 hour(s))  Culture, blood (routine x 2)     Status: None   Collection Time: 10/10/15 10:54 PM  Result Value Ref Range Status   Specimen Description BLOOD RIGHT ANTECUBITAL  Final   Special Requests   Final    BOTTLES DRAWN AEROBIC AND ANAEROBIC 6CC BLUE 5CC PURPLE   Culture NO GROWTH 5 DAYS  Final   Report Status 10/15/2015 FINAL  Final  Culture, blood (routine x 2)     Status: None   Collection Time: 10/10/15 11:01 PM  Result Value Ref Range Status   Specimen Description BLOOD LEFT ANTECUBITAL  Final   Special Requests   Final    BOTTLES DRAWN AEROBIC AND  ANAEROBIC 6CC BLUE 5CC PURPLE   Culture NO GROWTH 5 DAYS  Final   Report Status 10/15/2015 FINAL  Final  Culture, expectorated sputum-assessment     Status: None   Collection Time: 10/11/15  4:14 AM  Result Value Ref Range Status   Specimen Description EXPECTORATED SPUTUM  Final   Special Requests NONE  Final   Sputum evaluation   Final    THIS SPECIMEN IS ACCEPTABLE. RESPIRATORY CULTURE REPORT TO FOLLOW.   Report Status 10/11/2015 FINAL  Final  Culture, respiratory (NON-Expectorated)     Status: None   Collection Time: 10/11/15  4:14 AM  Result Value Ref Range Status   Specimen Description EXPECTORATED SPUTUM  Final   Special Requests NONE  Final   Gram Stain   Final    MODERATE WBC PRESENT, PREDOMINANTLY PMN MODERATE SQUAMOUS EPITHELIAL CELLS PRESENT ABUNDANT GRAM POSITIVE COCCI IN PAIRS IN CHAINS IN CLUSTERS FEW GRAM NEGATIVE RODS Performed at [facility]    Culture   Final     NORMAL OROPHARYNGEAL FLORA Performed at [facility]    Report Status 10/13/2015 FINAL  Final  C difficile quick scan w PCR reflex     Status: None   Collection Time: 10/13/15 12:50 PM  Result Value Ref Range Status   C Diff antigen NEGATIVE NEGATIVE Final   C Diff toxin NEGATIVE NEGATIVE Final   C Diff interpretation Negative for toxigenic C. difficile  Final    Studies/Results: Dg Chest 2 View  10/16/2015  CLINICAL DATA:  Empyema EXAM: CHEST  2 VIEW COMPARISON:  10/14/2015 FINDINGS: Stable loculated left hydro pneumothorax. Airspace disease throughout the left mid and lower lung also stable. No confluent opacity on the right. Small right pleural effusion. Underlying hyperinflation. Heart is normal size. IMPRESSION: Stable loculated left hydro pneumothorax and left lung airspace disease. Electronically Signed   By: Rolm Baptise M.D.   On: 10/16/2015 07:31   Ct Image Guided Drainage By Percutaneous Catheter  10/17/2015  CLINICAL DATA:  54 year old male with a history of left-sided VATS procedure and subsequent effusion/possible empyema. He has been referred for evaluation and possible chest tube drainage. EXAM: CT GUIDED DRAINAGE OF LEFT PLEURAL ABSCESS/EMPYEMA ANESTHESIA/SEDATION: 0.5 Mg IV Versed 25 mcg IV Fentanyl Total Moderate Sedation Time:  20 minutes PROCEDURE: The procedure, risks, benefits, and alternatives were explained to the patient. Questions regarding the procedure were encouraged and answered. The patient understands and consents to the procedure. Patient was positioned in left decubitus and a scout CT of the chest was performed for planning purposes. The posterior left chest was prepped with chlorhexidinein a sterile fashion, and a sterile drape was applied covering the operative field. A sterile gown and sterile gloves were used for the procedure. Local anesthesia was provided with 1% Lidocaine. Once the patient is prepped and draped in the usual sterile fashion, the  skin and subcutaneous tissues were generously infiltrated with 1% lidocaine for local anesthesia to the level of the pleura. A stab incision was made with 11 blade scalpel, and a Yueh needle was advanced under CT guidance into the pleural space. Aspiration of purulent fluid confirmed position. CT image confirmed position. Using modified Seldinger technique and dilation of the soft tissue tract, a 12 French pigtail catheter was placed into the pleural space. Sample was aspirated for lab evaluation. The catheter was sutured in position, final image was stored, and the catheter was attached to atrium water seal apparatus. Patient tolerated the procedure well and remained hemodynamically stable  throughout. No complications were encountered and no significant blood loss was encountered. COMPLICATIONS: None FINDINGS: Scout CT demonstrates left-sided complex and loculated pleural fluid, with the majority of the fluid in the dependent aspects of the chest. Twelve French pigtail catheter positioned into the drain via posterior approach. IMPRESSION: Status post CT-guided drainage of presumed left-sided empyema, with 12 French drainage catheter. Sample was sent to the lab for analysis. Signed, Dulcy Fanny. Earleen Newport, DO [facility] Electronically Signed   By: Corrie Mckusick D.O.   On: 10/17/2015 09:59     Assessment/Plan: Fever- improved Empyema Loose BM  Day 6 vanco/zosyn      New chest tube today So far has defervesced.  Stool pathogens panel is pending. Back on 12-16 Aim for 3 weeks of anbx, starting from 12-13     [place] (pager) [phone] [email or web address] 10/17/2015, 10:07 AM  LOS: 15 days

## 2015-10-17 NOTE — Sedation Documentation (Signed)
Patient is resting comfortably. 

## 2015-10-17 NOTE — Sedation Documentation (Signed)
Vital signs stable. 

## 2015-10-17 NOTE — Progress Notes (Addendum)
      [address]       [place] [address]             [phone]       15 Days Post-Op Procedure(s) (LRB): VIDEO ASSISTED THORACOSCOPY (VATS)/DECORTICATION and drainage of chronic empyena (Left) VIDEO BRONCHOSCOPY (N/A)  Subjective: Patient states still has loose stool, hemoptysis, and left sided "chest pain"  Objective: Vital signs in last 24 hours: Temp:  [98.5 F (36.9 C)-99.7 F (37.6 C)] 99.7 F (37.6 C) (12/13 0407) Pulse Rate:  [98-99] 98 (12/13 0407) Cardiac Rhythm:  [-] Normal sinus rhythm (12/12 1900) Resp:  [19-118] 118 (12/13 0407) BP: (110-126)/(77-81) 110/77 mmHg (12/13 0407) SpO2:  [93 %-98 %] 93 % (12/13 0407)     Intake/Output from previous day: 12/12 0701 - 12/13 0700 In: 720 [P.O.:720] Out: -    Physical Exam:  Cardiovascular: RRR Pulmonary: Clear on right and diminished on left. Abdomen: Soft, non tender, bowel sounds present. Wounds: Clean and dry.  No erythema or signs of infection.   Lab Results: CBC:  Recent Labs  10/16/15 0233  WBC 10.1  HGB 11.0*  HCT 33.2*  PLT 406*   BMET:   Recent Labs  10/16/15 0233  NA 135  K 3.8  CL 98*  CO2 30  GLUCOSE 112*  BUN 9  CREATININE 1.09  CALCIUM 8.3*    PT/INR:   Recent Labs  10/16/15 0854  LABPROT 15.2  INR 1.18   ABG:  INR: Will add last result for INR, ABG once components are confirmed Will add last 4 CBG results once components are confirmed  Assessment/Plan:  1. CV - SR in the 80's. 2.  Pulmonary - CXR this am appears stable (left hydro pneumothorax, small right pleural effusion). IR to place left drainage tube today 3. Fever to 99.7 this am. Last WBC "WNL" 10,100.  Has been on Vanco and Zosyn since 12/8. 4. H and H stable at 11 and 33.2 5. Loose stools-C. Dif negative. Await stool panel   ZIMMERMAN,DONIELLE MPA-C 10/17/2015,7:29 AM    New chest tube place ct guided I have seen and examined [name] [name] and agree with the above assessment   and plan.  [name] MD Beeper [phone] Office [phone] 10/17/2015 2:11 PM

## 2015-10-17 NOTE — Procedures (Signed)
Interventional Radiology Procedure Note  Procedure:  CT guided drainage of left pleural effusion/empyema.  50F pigtail catheter placed.   Complications: None Recommendations:  - follow up culture - Ok to shower tomorrow - Do not submerge  - Routine care  - Atrium water-seal apparatus care  Signed,  Dulcy Fanny. Earleen Newport, DO

## 2015-10-17 NOTE — Progress Notes (Signed)
ANTIBIOTIC CONSULT NOTE - FOLLOW UP  Pharmacy Consult for Zosyn and vancomycin Indication: empeyma  No Known Allergies  Patient Measurements: Height: 5' (152.4 cm) Weight: 92 lb 15.8 oz (42.18 kg) IBW/kg (Calculated) : 50  Vital Signs: Temp: 97.9 F (36.6 C) (12/13 1454) Temp Source: Oral (12/13 1454) BP: 122/72 mmHg (12/13 1454) Pulse Rate: 91 (12/13 1454) Intake/Output from previous day: 12/12 0701 - 12/13 0700 In: 720 [P.O.:720] Out: -   Labs:  Recent Labs  10/16/15 0233  WBC 10.1  HGB 11.0*  PLT 406*  CREATININE 1.09    Microbiology: Anti- infective's  12/8 vanc>> planned stop 11/07/15  12/8 zosyn>> planned stop 11/07/2015  Cx data: 12/13: abscess/empeyma: px 12/9 c diff: neg  12/7 resp- normal flora  12/6 blood x2- ngf  11/28 resp- H flu (beta-lac +)  11/28 L pleural fluid>>ngf  11/28 LL tissue>>ngf  Pertinent labs/levels from this admission 12/13 VT 6 (drawn 1.5 hours late) on 1000 mg Q24H and SCr 1.09  Assessment: 75 yoM with loculated pleural effusion that is s/p drainage of empyema with decortication 11/28 and subsequent L drainage tube placed today that is scheduled to be on abx for 3 weeks of abx starting from 10/17/15. WBC and fever curve improved. Current cx data is negative.   Goal of Therapy:  Vancomycin trough level 15-20 mcg/ml  Plan:  1. VT of 6 is below desired range of 15 - 20; change dose to 1000 mg Q 12H starting now 2. Continue Zosyn 3.375 grams IV q8H (EI) 3. Abx and length of therapy per ID (to continue for 3 weeks - stop date 11/07/15) 4. SCr Q72H at minimum while inpatient and on vancomycin 5. Redraw vancomycin trough at steady state  Vincenza Hews, PharmD, BCPS 10/17/2015, 4:38 PM Pager: [phone]

## 2015-10-17 NOTE — Progress Notes (Signed)
Utilization review completed.  

## 2015-10-18 LAB — BASIC METABOLIC PANEL
Anion gap: 7 (ref 5–15)
BUN: 8 mg/dL (ref 6–20)
CO2: 32 mmol/L (ref 22–32)
Calcium: 8.4 mg/dL — ABNORMAL LOW (ref 8.9–10.3)
Chloride: 96 mmol/L — ABNORMAL LOW (ref 101–111)
Creatinine, Ser: 1.38 mg/dL — ABNORMAL HIGH (ref 0.61–1.24)
GFR calc Af Amer: >60 mL/min (ref >60)
GFR calc non Af Amer: 57 mL/min — ABNORMAL LOW (ref >60)
Glucose, Bld: 112 mg/dL — ABNORMAL HIGH (ref 65–99)
Potassium: 3.6 mmol/L (ref 3.5–5.1)
Sodium: 135 mmol/L (ref 135–145)

## 2015-10-18 NOTE — Progress Notes (Addendum)
[address]       [employer] [address]             [phone]          16 Days Post-Op Procedure(s) (LRB): VIDEO ASSISTED THORACOSCOPY (VATS)/DECORTICATION and drainage of chronic empyena (Left) VIDEO BRONCHOSCOPY (N/A)  Subjective: Comfortable, only 2 loose stools over past 24 hrs per patient. Very little cough.   Objective: Vital signs in last 24 hours: Patient Vitals for the past 24 hrs:  BP Temp Temp src Pulse Resp SpO2  10/18/15 0407 113/77 mmHg 98.6 F (37 C) Oral 93 18 95 %  10/17/15 2038 120/84 mmHg 99.2 F (37.3 C) Oral (!) 102 20 92 %  10/17/15 1454 122/72 mmHg 97.9 F (36.6 C) Oral 91 20 95 %  10/17/15 0913 109/78 mmHg - - 88 (!) 22 96 %  10/17/15 0910 110/73 mmHg - - 84 20 100 %  10/17/15 0905 105/75 mmHg - - 84 20 100 %  10/17/15 0900 98/67 mmHg - - 85 16 99 %  10/17/15 0856 112/77 mmHg - - 86 12 100 %  10/17/15 0851 103/74 mmHg - - 86 16 100 %   Current Weight  10/02/15 92 lb 15.8 oz (42.18 kg)     Intake/Output from previous day: 12/13 0701 - 12/14 0700 In: 240 [P.O.:240] Out: 70 [Chest Tube:70]    PHYSICAL EXAM:  Heart: RRR Lungs: Decreased BS bilaterally Wound: C;ean and dry Abdomen: soft, NT/ND, +BS    Lab Results: CBC: Recent Labs  10/16/15 0233  WBC 10.1  HGB 11.0*  HCT 33.2*  PLT 406*   BMET:  Recent Labs  10/16/15 0233 10/18/15 0348  NA 135 135  K 3.8 3.6  CL 98* 96*  CO2 30 32  GLUCOSE 112* 112*  BUN 9 8  CREATININE 1.09 1.38*  CALCIUM 8.3* 8.4*    PT/INR:  Recent Labs  10/16/15 0854  LABPROT 15.2  INR 1.18   Recent Results (from the past 240 hour(s))  Culture, blood (routine x 2)     Status: None   Collection Time: 10/10/15 10:54 PM  Result Value Ref Range Status   Specimen Description BLOOD RIGHT ANTECUBITAL  Final   Special Requests   Final    BOTTLES DRAWN AEROBIC AND ANAEROBIC 6CC BLUE 5CC PURPLE   Culture NO GROWTH 5 DAYS  Final   Report Status 10/15/2015 FINAL  Final    Culture, blood (routine x 2)     Status: None   Collection Time: 10/10/15 11:01 PM  Result Value Ref Range Status   Specimen Description BLOOD LEFT ANTECUBITAL  Final   Special Requests   Final    BOTTLES DRAWN AEROBIC AND ANAEROBIC 6CC BLUE 5CC PURPLE   Culture NO GROWTH 5 DAYS  Final   Report Status 10/15/2015 FINAL  Final  Culture, expectorated sputum-assessment     Status: None   Collection Time: 10/11/15  4:14 AM  Result Value Ref Range Status   Specimen Description EXPECTORATED SPUTUM  Final   Special Requests NONE  Final   Sputum evaluation   Final    THIS SPECIMEN IS ACCEPTABLE. RESPIRATORY CULTURE REPORT TO FOLLOW.   Report Status 10/11/2015 FINAL  Final  Culture, respiratory (NON-Expectorated)     Status: None   Collection Time: 10/11/15  4:14 AM  Result Value Ref Range Status   Specimen Description EXPECTORATED SPUTUM  Final   Special Requests NONE  Final  Gram Stain   Final    MODERATE WBC PRESENT, PREDOMINANTLY PMN MODERATE SQUAMOUS EPITHELIAL CELLS PRESENT ABUNDANT GRAM POSITIVE COCCI IN PAIRS IN CHAINS IN CLUSTERS FEW GRAM NEGATIVE RODS Performed at [facility]    Culture   Final    NORMAL OROPHARYNGEAL FLORA Performed at [facility]    Report Status 10/13/2015 FINAL  Final  C difficile quick scan w PCR reflex     Status: None   Collection Time: 10/13/15 12:50 PM  Result Value Ref Range Status   C Diff antigen NEGATIVE NEGATIVE Final   C Diff toxin NEGATIVE NEGATIVE Final   C Diff interpretation Negative for toxigenic C. difficile  Final  Culture, routine-abscess     Status: None (Preliminary result)   Collection Time: 10/17/15 12:14 PM  Result Value Ref Range Status   Specimen Description ABSCESS LEFT PLEURAL  Final   Special Requests NONE  Final   Gram Stain   Final    ABUNDANT WBC PRESENT,BOTH PMN AND MONONUCLEAR NO SQUAMOUS EPITHELIAL CELLS SEEN NO ORGANISMS SEEN Performed at [facility]    Culture   Final    NO  GROWTH 1 DAY Performed at [facility]    Report Status PENDING  Incomplete     Assessment/Plan: S/P Procedure(s) (LRB): VIDEO ASSISTED THORACOSCOPY (VATS)/DECORTICATION and drainage of chronic empyena (Left) VIDEO BRONCHOSCOPY (N/A) Pigtail catheter placed by IR yesterday, minimal drainage.  GI- fewer loose stools. Stool panel negative.  ID- No more fevers and WBC back to baseline. To continue Vanc/Zosyn x 3 weeks per Dr. Johnnye Sima.   LOS: 16 days    [name] [name],[name] [name] 10/18/2015   minimal drainage from new chest drain , so far micro on decortication and yesterday not adding any clinical information Feels better , temp curve better , wbc count decreased Ct to suction  Follow up pa and lateral chest xray in am I have seen and examined [name] [name] and agree with the above assessment  and plan.  [name] MD Beeper [phone] Office [phone] 10/18/2015 10:35 AM

## 2015-10-18 NOTE — Progress Notes (Addendum)
INFECTIOUS DISEASE PROGRESS NOTE  ID: [name] [name] is a 54 y.o. male with  Active Problems:   Empyema, left (HCC)  Subjective: Without complaints. Denies pain  Abtx:  Anti-infectives    Start     Dose/Rate Route Frequency Ordered Stop   10/17/15 1630  vancomycin (VANCOCIN) IVPB 1000 mg/200 mL premix     1,000 mg 200 mL/hr over 60 Minutes Intravenous Every 12 hours 10/17/15 1626     10/12/15 2130  piperacillin-tazobactam (ZOSYN) IVPB 3.375 g     3.375 g 12.5 mL/hr over 240 Minutes Intravenous 3 times per day 10/12/15 2107     10/12/15 1300  vancomycin (VANCOCIN) IVPB 1000 mg/200 mL premix  Status:  Discontinued     1,000 mg 200 mL/hr over 60 Minutes Intravenous Every 24 hours 10/12/15 1202 10/17/15 1626   10/12/15 1200  vancomycin (VANCOCIN) IVPB 750 mg/150 ml premix  Status:  Discontinued     750 mg 150 mL/hr over 60 Minutes Intravenous  Once 10/12/15 1151 10/12/15 1202   10/12/15 1200  piperacillin-tazobactam (ZOSYN) IVPB 3.375 g     3.375 g 100 mL/hr over 30 Minutes Intravenous  Once 10/12/15 1151 10/12/15 1338   10/02/15 2000  cefUROXime (ZINACEF) 1.5 g in dextrose 5 % 50 mL IVPB     1.5 g 100 mL/hr over 30 Minutes Intravenous Every 12 hours 10/02/15 1505 10/03/15 0756   10/02/15 0616  cefUROXime (ZINACEF) 1.5 g in dextrose 5 % 50 mL IVPB     1.5 g 100 mL/hr over 30 Minutes Intravenous 60 min pre-op 10/02/15 0616 10/02/15 0755      Medications:  Scheduled: . pantoprazole  40 mg Oral Daily  . piperacillin-tazobactam (ZOSYN)  IV  3.375 g Intravenous 3 times per day  . vancomycin  1,000 mg Intravenous Q12H    Objective: Vital signs in last 24 hours: Temp:  [97.9 F (36.6 C)-99.2 F (37.3 C)] 99 F (37.2 C) (12/14 1351) Pulse Rate:  [91-102] 99 (12/14 1351) Resp:  [16-20] 16 (12/14 1351) BP: (113-122)/(72-86) 120/86 mmHg (12/14 1351) SpO2:  [92 %-95 %] 95 % (12/14 1351)   General appearance: alert, cooperative and no distress Resp: rhonchi anterior -  bilateral Cardio: regular rate and rhythm GI: normal findings: bowel sounds normal and soft, non-tender  Lab Results  Recent Labs  10/16/15 0233 10/18/15 0348  WBC 10.1  --   HGB 11.0*  --   HCT 33.2*  --   NA 135 135  K 3.8 3.6  CL 98* 96*  CO2 30 32  BUN 9 8  CREATININE 1.09 1.38*   Liver Panel  Recent Labs  10/16/15 0233  PROT 7.4  ALBUMIN 2.0*  AST 46*  ALT 53  ALKPHOS 77  BILITOT 0.7   Sedimentation Rate No results for input(s): ESRSEDRATE in the last 72 hours. C-Reactive Protein No results for input(s): CRP in the last 72 hours.  Microbiology: Recent Results (from the past 240 hour(s))  Culture, blood (routine x 2)     Status: None   Collection Time: 10/10/15 10:54 PM  Result Value Ref Range Status   Specimen Description BLOOD RIGHT ANTECUBITAL  Final   Special Requests   Final    BOTTLES DRAWN AEROBIC AND ANAEROBIC 6CC BLUE 5CC PURPLE   Culture NO GROWTH 5 DAYS  Final   Report Status 10/15/2015 FINAL  Final  Culture, blood (routine x 2)     Status: None   Collection Time: 10/10/15 11:01 PM  Result  Value Ref Range Status   Specimen Description BLOOD LEFT ANTECUBITAL  Final   Special Requests   Final    BOTTLES DRAWN AEROBIC AND ANAEROBIC 6CC BLUE 5CC PURPLE   Culture NO GROWTH 5 DAYS  Final   Report Status 10/15/2015 FINAL  Final  Culture, expectorated sputum-assessment     Status: None   Collection Time: 10/11/15  4:14 AM  Result Value Ref Range Status   Specimen Description EXPECTORATED SPUTUM  Final   Special Requests NONE  Final   Sputum evaluation   Final    THIS SPECIMEN IS ACCEPTABLE. RESPIRATORY CULTURE REPORT TO FOLLOW.   Report Status 10/11/2015 FINAL  Final  Culture, respiratory (NON-Expectorated)     Status: None   Collection Time: 10/11/15  4:14 AM  Result Value Ref Range Status   Specimen Description EXPECTORATED SPUTUM  Final   Special Requests NONE  Final   Gram Stain   Final    MODERATE WBC PRESENT, PREDOMINANTLY  PMN MODERATE SQUAMOUS EPITHELIAL CELLS PRESENT ABUNDANT GRAM POSITIVE COCCI IN PAIRS IN CHAINS IN CLUSTERS FEW GRAM NEGATIVE RODS Performed at [facility]    Culture   Final    NORMAL OROPHARYNGEAL FLORA Performed at [facility]    Report Status 10/13/2015 FINAL  Final  C difficile quick scan w PCR reflex     Status: None   Collection Time: 10/13/15 12:50 PM  Result Value Ref Range Status   C Diff antigen NEGATIVE NEGATIVE Final   C Diff toxin NEGATIVE NEGATIVE Final   C Diff interpretation Negative for toxigenic C. difficile  Final  Culture, routine-abscess     Status: None (Preliminary result)   Collection Time: 10/17/15 12:14 PM  Result Value Ref Range Status   Specimen Description ABSCESS LEFT PLEURAL  Final   Special Requests NONE  Final   Gram Stain   Final    ABUNDANT WBC PRESENT,BOTH PMN AND MONONUCLEAR NO SQUAMOUS EPITHELIAL CELLS SEEN NO ORGANISMS SEEN Performed at [facility]    Culture   Final    NO GROWTH 1 DAY Performed at [facility]    Report Status PENDING  Incomplete    Studies/Results: Ct Image Guided Drainage By Percutaneous Catheter  10/17/2015  CLINICAL DATA:  54 year old male with a history of left-sided VATS procedure and subsequent effusion/possible empyema. He has been referred for evaluation and possible chest tube drainage. EXAM: CT GUIDED DRAINAGE OF LEFT PLEURAL ABSCESS/EMPYEMA ANESTHESIA/SEDATION: 0.5 Mg IV Versed 25 mcg IV Fentanyl Total Moderate Sedation Time:  20 minutes PROCEDURE: The procedure, risks, benefits, and alternatives were explained to the patient. Questions regarding the procedure were encouraged and answered. The patient understands and consents to the procedure. Patient was positioned in left decubitus and a scout CT of the chest was performed for planning purposes. The posterior left chest was prepped with chlorhexidinein a sterile fashion, and a sterile drape was applied covering the operative  field. A sterile gown and sterile gloves were used for the procedure. Local anesthesia was provided with 1% Lidocaine. Once the patient is prepped and draped in the usual sterile fashion, the skin and subcutaneous tissues were generously infiltrated with 1% lidocaine for local anesthesia to the level of the pleura. A stab incision was made with 11 blade scalpel, and a Yueh needle was advanced under CT guidance into the pleural space. Aspiration of purulent fluid confirmed position. CT image confirmed position. Using modified Seldinger technique and dilation of the soft tissue tract, a 12  French pigtail catheter was placed into the pleural space. Sample was aspirated for lab evaluation. The catheter was sutured in position, final image was stored, and the catheter was attached to atrium water seal apparatus. Patient tolerated the procedure well and remained hemodynamically stable throughout. No complications were encountered and no significant blood loss was encountered. COMPLICATIONS: None FINDINGS: Scout CT demonstrates left-sided complex and loculated pleural fluid, with the majority of the fluid in the dependent aspects of the chest. Twelve French pigtail catheter positioned into the drain via posterior approach. IMPRESSION: Status post CT-guided drainage of presumed left-sided empyema, with 12 French drainage catheter. Sample was sent to the lab for analysis. Signed, Dulcy Fanny. Earleen Newport, DO [facility] Radiology Electronically Signed   By: Corrie Mckusick D.O.   On: 10/17/2015 09:59     Assessment/Plan: Fever- improved Empyema Loose BM  Day 7 vanco/zosyn  New chest tube 12-13 Repeat Cx ngtd Remains afebrile.  Stool pathogens panel is pending. Back on 12-16 Aim for 3 weeks of anbx, starting from 12-13 Path report from [facility] pending.          Bobby Rumpf Infectious Diseases (pager)  [phone] [email or web address] 10/18/2015, 1:54 PM  LOS: 16 days

## 2015-10-18 NOTE — Progress Notes (Signed)
Pt had order from IR to flush pigtail catheter chest tube.  RN called Dr. Cyndia Bent to clarify order. MD stated to hold on until Dr. Servando Snare could round on patient and clarify it further. Chest tube patent and draining minimal drainage.   Will continue to monitor.   [name] [name]

## 2015-10-19 ENCOUNTER — Inpatient Hospital Stay (HOSPITAL_COMMUNITY): Payer: BLUE CROSS/BLUE SHIELD

## 2015-10-19 MED ORDER — SODIUM CHLORIDE 0.9 % IJ SOLN
10.0000 mL | INTRAMUSCULAR | Status: DC | PRN
  Administered 2015-10-20: 10 mL
  Filled 2015-10-19: qty 40

## 2015-10-19 NOTE — Progress Notes (Signed)
INFECTIOUS DISEASE PROGRESS NOTE  ID: [name] [name] is a 54 y.o. male with  Active Problems:   Empyema, left (HCC)  Subjective: Without complaints, no sob, no cp  Abtx:  Anti-infectives    Start     Dose/Rate Route Frequency Ordered Stop   10/17/15 1630  vancomycin (VANCOCIN) IVPB 1000 mg/200 mL premix     1,000 mg 200 mL/hr over 60 Minutes Intravenous Every 12 hours 10/17/15 1626     10/12/15 2130  piperacillin-tazobactam (ZOSYN) IVPB 3.375 g     3.375 g 12.5 mL/hr over 240 Minutes Intravenous 3 times per day 10/12/15 2107     10/12/15 1300  vancomycin (VANCOCIN) IVPB 1000 mg/200 mL premix  Status:  Discontinued     1,000 mg 200 mL/hr over 60 Minutes Intravenous Every 24 hours 10/12/15 1202 10/17/15 1626   10/12/15 1200  vancomycin (VANCOCIN) IVPB 750 mg/150 ml premix  Status:  Discontinued     750 mg 150 mL/hr over 60 Minutes Intravenous  Once 10/12/15 1151 10/12/15 1202   10/12/15 1200  piperacillin-tazobactam (ZOSYN) IVPB 3.375 g     3.375 g 100 mL/hr over 30 Minutes Intravenous  Once 10/12/15 1151 10/12/15 1338   10/02/15 2000  cefUROXime (ZINACEF) 1.5 g in dextrose 5 % 50 mL IVPB     1.5 g 100 mL/hr over 30 Minutes Intravenous Every 12 hours 10/02/15 1505 10/03/15 0756   10/02/15 0616  cefUROXime (ZINACEF) 1.5 g in dextrose 5 % 50 mL IVPB     1.5 g 100 mL/hr over 30 Minutes Intravenous 60 min pre-op 10/02/15 0616 10/02/15 0755      Medications:  Scheduled: . pantoprazole  40 mg Oral Daily  . piperacillin-tazobactam (ZOSYN)  IV  3.375 g Intravenous 3 times per day  . vancomycin  1,000 mg Intravenous Q12H    Objective: Vital signs in last 24 hours: Temp:  [99 F (37.2 C)-99.7 F (37.6 C)] 99.7 F (37.6 C) (12/15 0540) Pulse Rate:  [98-104] 98 (12/15 0540) Resp:  [16-18] 18 (12/15 0540) BP: (108-120)/(74-86) 117/84 mmHg (12/15 0540) SpO2:  [90 %-95 %] 90 % (12/15 0540)   General appearance: alert, cooperative and no distress Resp: diminished breath sounds LLL  and LUL and rhonchi LLL and LUL Cardio: regular rate and rhythm GI: normal findings: bowel sounds normal and soft, non-tender  Lab Results  Recent Labs  10/18/15 0348  NA 135  K 3.6  CL 96*  CO2 32  BUN 8  CREATININE 1.38*   Liver Panel No results for input(s): PROT, ALBUMIN, AST, ALT, ALKPHOS, BILITOT, BILIDIR, IBILI in the last 72 hours. Sedimentation Rate No results for input(s): ESRSEDRATE in the last 72 hours. C-Reactive Protein No results for input(s): CRP in the last 72 hours.  Microbiology: Recent Results (from the past 240 hour(s))  Culture, blood (routine x 2)     Status: None   Collection Time: 10/10/15 10:54 PM  Result Value Ref Range Status   Specimen Description BLOOD RIGHT ANTECUBITAL  Final   Special Requests   Final    BOTTLES DRAWN AEROBIC AND ANAEROBIC 6CC BLUE 5CC PURPLE   Culture NO GROWTH 5 DAYS  Final   Report Status 10/15/2015 FINAL  Final  Culture, blood (routine x 2)     Status: None   Collection Time: 10/10/15 11:01 PM  Result Value Ref Range Status   Specimen Description BLOOD LEFT ANTECUBITAL  Final   Special Requests   Final    BOTTLES DRAWN AEROBIC  AND ANAEROBIC 6CC BLUE 5CC PURPLE   Culture NO GROWTH 5 DAYS  Final   Report Status 10/15/2015 FINAL  Final  Culture, expectorated sputum-assessment     Status: None   Collection Time: 10/11/15  4:14 AM  Result Value Ref Range Status   Specimen Description EXPECTORATED SPUTUM  Final   Special Requests NONE  Final   Sputum evaluation   Final    THIS SPECIMEN IS ACCEPTABLE. RESPIRATORY CULTURE REPORT TO FOLLOW.   Report Status 10/11/2015 FINAL  Final  Culture, respiratory (NON-Expectorated)     Status: None   Collection Time: 10/11/15  4:14 AM  Result Value Ref Range Status   Specimen Description EXPECTORATED SPUTUM  Final   Special Requests NONE  Final   Gram Stain   Final    MODERATE WBC PRESENT, PREDOMINANTLY PMN MODERATE SQUAMOUS EPITHELIAL CELLS PRESENT ABUNDANT GRAM POSITIVE  COCCI IN PAIRS IN CHAINS IN CLUSTERS FEW GRAM NEGATIVE RODS Performed at [facility]    Culture   Final    NORMAL OROPHARYNGEAL FLORA Performed at [facility]    Report Status 10/13/2015 FINAL  Final  C difficile quick scan w PCR reflex     Status: None   Collection Time: 10/13/15 12:50 PM  Result Value Ref Range Status   C Diff antigen NEGATIVE NEGATIVE Final   C Diff toxin NEGATIVE NEGATIVE Final   C Diff interpretation Negative for toxigenic C. difficile  Final  Culture, routine-abscess     Status: None (Preliminary result)   Collection Time: 10/17/15 12:14 PM  Result Value Ref Range Status   Specimen Description ABSCESS LEFT PLEURAL  Final   Special Requests NONE  Final   Gram Stain   Final    ABUNDANT WBC PRESENT,BOTH PMN AND MONONUCLEAR NO SQUAMOUS EPITHELIAL CELLS SEEN NO ORGANISMS SEEN Performed at [facility]    Culture   Final    MULTIPLE ORGANISMS PRESENT, NONE PREDOMINANT Performed at [facility]    Report Status PENDING  Incomplete    Studies/Results: No results found.   Assessment/Plan: Fever- improved Empyema Loose BM  Day 8 vanco/zosyn  New chest tube 12-13 Repeat Cx polymicrobial- await final result. no change in anbx Remains afebrile.  Stool pathogens panel is pending. Back on 12-16 Aim for 3 weeks of anbx, starting from 12-13 Path report from [facility] pending.          Bobby Rumpf Infectious Diseases (pager) [phone] [email or web address] 10/19/2015, 10:19 AM  LOS: 17 days

## 2015-10-19 NOTE — Progress Notes (Addendum)
      [address]       [place] [address]             [phone]       17 Days Post-Op Procedure(s) (LRB): VIDEO ASSISTED THORACOSCOPY (VATS)/DECORTICATION and drainage of chronic empyena (Left) VIDEO BRONCHOSCOPY (N/A)  Subjective: Patient with occasional loose stools  Objective: Vital signs in last 24 hours: Temp:  [99 F (37.2 C)-99.7 F (37.6 C)] 99.7 F (37.6 C) (12/15 0540) Pulse Rate:  [98-104] 98 (12/15 0540) Cardiac Rhythm:  [-] Normal sinus rhythm (12/14 2031) Resp:  [16-18] 18 (12/15 0540) BP: (108-120)/(74-86) 117/84 mmHg (12/15 0540) SpO2:  [90 %-95 %] 90 % (12/15 0540)     Intake/Output from previous day: 12/14 0701 - 12/15 0700 In: 480 [P.O.:480] Out: 97 [Chest Tube:97]   Physical Exam:  Cardiovascular: RRR Pulmonary: Clear on right and diminished on left. Abdomen: Soft, non tender, bowel sounds present. Wound: Clean and dry.  No erythema or signs of infection. Chest tube: to suction, small air leak  Lab Results: CBC: No results for input(s): WBC, HGB, HCT, PLT in the last 72 hours. BMET:   Recent Labs  10/18/15 0348  NA 135  K 3.6  CL 96*  CO2 32  GLUCOSE 112*  BUN 8  CREATININE 1.38*  CALCIUM 8.4*    PT/INR:   Recent Labs  10/16/15 0854  LABPROT 15.2  INR 1.18   ABG:  INR: Will add last result for INR, ABG once components are confirmed Will add last 4 CBG results once components are confirmed  Assessment/Plan:  1. CV - SR in the 90's. 2.  Pulmonary - On room air. CT to suction with small air leak. CT with minimal drainage. Check CXR in am. 3. Low grade fever to 99.7 this am. Last WBC "WNL" 10,100.  Has been on Vanco and Zosyn. Per infectious disease, needs a total of 3 weeks from 12/13. 4. H and H stable at 11 and 33.2 5. Loose stools-C. Dif negative. Await stool panel   ZIMMERMAN,DONIELLE MPA-C 10/19/2015,7:24 AM    Dg Chest 2 View  10/19/2015  CLINICAL DATA:  Empyema status post left chest  tube EXAM: CHEST  2 VIEW COMPARISON:  10/16/2015 chest radiograph. FINDINGS: Left pleural catheter tip overlies the lower left lung. Stable cardiomediastinal silhouette with normal heart size. No right pneumothorax. There is a small to moderate lateral/basilar left hydro pneumothorax, which overall appears stable in size, with decreased fluid component and slightly increased pneumothorax component. No mediastinal shift. Persistent pleural thickening throughout the lateral and basilar left pleural space. Calcified pleural plaques overlie the lower lungs bilaterally. No pulmonary edema. Patchy opacity at the left lung base, stable. IMPRESSION: 1. Overall stable size of small moderate lateral/basilar left hydropneumothorax status post left pleural catheter placement, with decreased fluid component and increased pneumothorax component, suggesting trapped left lung. No mediastinal shift. 2. Stable pleural thickening throughout the lateral/basilar left pleural space. Stable bilateral calcified pleural plaques. 3. Stable patchy opacity at the left lung base, nonspecific. Electronically Signed   By: Ilona Sorrel M.D.   On: 10/19/2015 12:12   I have seen and examined [name] [name] and agree with the above assessment  and plan.  [name] MD Beeper [phone] Office [phone] 10/19/2015 6:13 PM

## 2015-10-19 NOTE — Care Management Note (Signed)
Case Management Note Previous CM note initiated by Whitman Hero RN, CM  Patient Details  Name: [name] [name] MRN: [id number] Date of Birth: 01-26-1961  Subjective/Objective:                 Admitted with Left plerual effusion,  s/p VIDEO ASSISTED THORACOSCOPY (VATS)/DECORTICATION and drainage of chronic empyena (Left) 10/02/15. VIDEO BRONCHOSCOPY.Pt is [place] however speaks some english. Resides with girlfriend. Independent with ADL's. No DME usage.    Action/Plan: Return to home when medically stable. CM to f/u with disposition needs.  Expected Discharge Date:                  Expected Discharge Plan:  [place]  In-House Referral:     Discharge planning Services  CM Consult  Post Acute Care Choice:  Home Health Choice offered to:  Patient  DME Arranged:    DME Agency:     HH Arranged:  RN, IV Antibiotics HH Agency:  [facility]  Status of Service:  In process, will continue to follow  Medicare Important Message Given:    Date Medicare IM Given:    Medicare IM give by:    Date Additional Medicare IM Given:    Additional Medicare Important Message give by:     If discussed at [place] of Stay Meetings, dates discussed:  10/19/15  Additional Comments:   10/19/15- pt now with pigtail CT placed by IR- to suction- per ID will need a total of 3 wks of IV abx- order for PICC placed-spoke with pt about d/c needs and HH- does not have preference for agency -pt agreeable to [facility]- have spoken with Pam from [facility] regarding home IV abx needs- [facility] will follow for discharge needs- unsure if pt will go home with CT at this time. Will need HH orders for RN (iv abx) when ready for discharge.   [name] [name], [place] [phone] 10/19/2015, 10:57 AM

## 2015-10-19 NOTE — Progress Notes (Signed)
[facility]  Patient Status:  New pt for [facility] this admission  [facility] is providing the following services: HHRN and [facility] for home IV ABX. [facility] hospital team will be following [name] until DC to support transition home.   If patient discharges after hours, please call [phone].   [name] 10/19/2015, 7:00 AM

## 2015-10-19 NOTE — Progress Notes (Signed)
Peripherally Inserted Central Catheter/Midline Placement  The IV Nurse has discussed with the patient and/or persons authorized to consent for the patient, the purpose of this procedure and the potential benefits and risks involved with this procedure.  The benefits include less needle sticks, lab draws from the catheter and patient may be discharged home with the catheter.  Risks include, but not limited to, infection, bleeding, blood clot (thrombus formation), and puncture of an artery; nerve damage and irregular heat beat.  Alternatives to this procedure were also discussed.  PICC/Midline Placement Documentation  PICC Single Lumen 10/19/15 PICC Right Brachial 36 cm 0 cm (Active)  Indication for Insertion or Continuance of Line Home intravenous therapies (PICC only) 10/19/2015 12:00 PM  Exposed Catheter (cm) 0 cm 10/19/2015 12:00 PM  Dressing Change Due 10/26/15 10/19/2015 12:00 PM       Jule Economy Horton 10/19/2015, 12:48 PM

## 2015-10-20 ENCOUNTER — Inpatient Hospital Stay (HOSPITAL_COMMUNITY): Payer: BLUE CROSS/BLUE SHIELD

## 2015-10-20 DIAGNOSIS — J869 Pyothorax without fistula: Secondary | ICD-10-CM | POA: Diagnosis not present

## 2015-10-20 DIAGNOSIS — N178 Other acute kidney failure: Secondary | ICD-10-CM

## 2015-10-20 DIAGNOSIS — J90 Pleural effusion, not elsewhere classified: Secondary | ICD-10-CM | POA: Diagnosis not present

## 2015-10-20 DIAGNOSIS — Z9689 Presence of other specified functional implants: Secondary | ICD-10-CM | POA: Insufficient documentation

## 2015-10-20 LAB — BASIC METABOLIC PANEL
Anion gap: 7 (ref 5–15)
BUN: 7 mg/dL (ref 6–20)
CO2: 30 mmol/L (ref 22–32)
Calcium: 8.2 mg/dL — ABNORMAL LOW (ref 8.9–10.3)
Chloride: 97 mmol/L — ABNORMAL LOW (ref 101–111)
Creatinine, Ser: 1.48 mg/dL — ABNORMAL HIGH (ref 0.61–1.24)
GFR calc Af Amer: >60 mL/min (ref >60)
GFR calc non Af Amer: 52 mL/min — ABNORMAL LOW (ref >60)
Glucose, Bld: 101 mg/dL — ABNORMAL HIGH (ref 65–99)
Potassium: 4.2 mmol/L (ref 3.5–5.1)
Sodium: 134 mmol/L — ABNORMAL LOW (ref 135–145)

## 2015-10-20 LAB — VANCOMYCIN, TROUGH: Vancomycin Tr: 32 ug/mL (ref 10.0–20.0)

## 2015-10-20 LAB — CULTURE, ROUTINE-ABSCESS

## 2015-10-20 MED ORDER — OXYCODONE HCL 5 MG PO TABS: 5.0000 mg | ORAL_TABLET | ORAL | Status: DC | PRN

## 2015-10-20 MED ORDER — PIPERACILLIN-TAZOBACTAM 3.375 G IVPB: 3.3750 g | Freq: Three times a day (TID) | INTRAVENOUS | Status: DC

## 2015-10-20 NOTE — Progress Notes (Addendum)
      [address]       [place] [address]             [phone]       18 Days Post-Op Procedure(s) (LRB): VIDEO ASSISTED THORACOSCOPY (VATS)/DECORTICATION and drainage of chronic empyena (Left) VIDEO BRONCHOSCOPY (N/A)  Subjective: Patient with occasional loose stools  Objective: Vital signs in last 24 hours: Temp:  [98.8 F (37.1 C)-101.2 F (38.4 C)] 99.1 F (37.3 C) (12/16 0514) Pulse Rate:  [94-105] 94 (12/16 0514) Cardiac Rhythm:  [-] Sinus tachycardia (12/15 1946) Resp:  [18] 18 (12/16 0514) BP: (105-131)/(75-85) 105/75 mmHg (12/16 0514) SpO2:  [93 %-95 %] 94 % (12/16 0514)     Intake/Output from previous day: 12/15 0701 - 12/16 0700 In: 720 [P.O.:720] Out: -    Physical Exam:  Cardiovascular: RRR Pulmonary: Clear on right and diminished on left. Abdomen: Soft, non tender, bowel sounds present. Wound: Clean and dry.  No erythema or signs of infection. Chest tube: No  air leak  Lab Results: CBC: No results for input(s): WBC, HGB, HCT, PLT in the last 72 hours. BMET:   Recent Labs  10/18/15 0348 10/20/15 0422  NA 135 134*  K 3.6 4.2  CL 96* 97*  CO2 32 30  GLUCOSE 112* 101*  BUN 8 7  CREATININE 1.38* 1.48*  CALCIUM 8.4* 8.2*    PT/INR:  No results for input(s): LABPROT, INR in the last 72 hours. ABG:  INR: Will add last result for INR, ABG once components are confirmed Will add last 4 CBG results once components are confirmed  Assessment/Plan:  1. CV - SR in the 90's. 2.  Pulmonary - On room air. CT has no air leak and with minimal drainage.CXR yesterday showed left hydropneumothorax (decreased fluid component and increased pneumothorax compenent). Will change from Pleura Vac to mini express. 3. Fever to 101.2. Last WBC "WNL" 10,100.  On Vanco and Zosyn. Will confirm with Dr. Johnnye Sima if needs both Vanco and Zosyn for a total of 3 weeks. Pharmacy to draw Vanco level this afternoon  4. Creatinine slightly increased from  1.38 to 1.48 5. Loose stools-C. Dif negative. Await stool panel 6. Possibly home later today vs am once antibiotics confirmed  ZIMMERMAN,DONIELLE MPA-C 10/20/2015,7:53 AM    Feels better, loose stolls have stopped Poss home today on IV antibiotics and with left pleural drain in place to mini express, to be removed next week in aoffice I have seen and examined [name] [name] and agree with the above assessment  and plan.  [name] MD Beeper [phone] Office [phone] 10/20/2015 8:12 AM

## 2015-10-20 NOTE — Progress Notes (Signed)
INFECTIOUS DISEASE PROGRESS NOTE  ID: [name] [name] is a 54 y.o. male with  Active Problems:   Empyema, left ([place])  Subjective: No complaints  Abtx:  Anti-infectives    Start     Dose/Rate Route Frequency Ordered Stop   10/17/15 1630  vancomycin (VANCOCIN) IVPB 1000 mg/200 mL premix  Status:  Discontinued     1,000 mg 200 mL/hr over 60 Minutes Intravenous Every 12 hours 10/17/15 1626 10/20/15 0511   10/12/15 2130  piperacillin-tazobactam (ZOSYN) IVPB 3.375 g     3.375 g 12.5 mL/hr over 240 Minutes Intravenous 3 times per day 10/12/15 2107     10/12/15 1300  vancomycin (VANCOCIN) IVPB 1000 mg/200 mL premix  Status:  Discontinued     1,000 mg 200 mL/hr over 60 Minutes Intravenous Every 24 hours 10/12/15 1202 10/17/15 1626   10/12/15 1200  vancomycin (VANCOCIN) IVPB 750 mg/150 ml premix  Status:  Discontinued     750 mg 150 mL/hr over 60 Minutes Intravenous  Once 10/12/15 1151 10/12/15 1202   10/12/15 1200  piperacillin-tazobactam (ZOSYN) IVPB 3.375 g     3.375 g 100 mL/hr over 30 Minutes Intravenous  Once 10/12/15 1151 10/12/15 1338   10/02/15 2000  cefUROXime (ZINACEF) 1.5 g in dextrose 5 % 50 mL IVPB     1.5 g 100 mL/hr over 30 Minutes Intravenous Every 12 hours 10/02/15 1505 10/03/15 0756   10/02/15 0616  cefUROXime (ZINACEF) 1.5 g in dextrose 5 % 50 mL IVPB     1.5 g 100 mL/hr over 30 Minutes Intravenous 60 min pre-op 10/02/15 0616 10/02/15 0755      Medications:  Scheduled: . pantoprazole  40 mg Oral Daily  . piperacillin-tazobactam (ZOSYN)  IV  3.375 g Intravenous 3 times per day    Objective: Vital signs in last 24 hours: Temp:  [98.8 F (37.1 C)-101.2 F (38.4 C)] 99.1 F (37.3 C) (12/16 0514) Pulse Rate:  [94-105] 94 (12/16 0514) Resp:  [18] 18 (12/16 0514) BP: (105-131)/(75-85) 105/75 mmHg (12/16 0514) SpO2:  [93 %-95 %] 94 % (12/16 0514)   General appearance: alert, cooperative and no distress Resp: diminished breath sounds LLL and LUL Chest wall: no  tenderness, chest tube site dressed.  clean. Cardio: regular rate and rhythm GI: normal findings: bowel sounds normal and soft, non-tender  Lab Results  Recent Labs  10/18/15 0348 10/20/15 0422  NA 135 134*  K 3.6 4.2  CL 96* 97*  CO2 32 30  BUN 8 7  CREATININE 1.38* 1.48*   Liver Panel No results for input(s): PROT, ALBUMIN, AST, ALT, ALKPHOS, BILITOT, BILIDIR, IBILI in the last 72 hours. Sedimentation Rate No results for input(s): ESRSEDRATE in the last 72 hours. C-Reactive Protein No results for input(s): CRP in the last 72 hours.  Microbiology: Recent Results (from the past 240 hour(s))  Culture, blood (routine x 2)     Status: None   Collection Time: 10/10/15 10:54 PM  Result Value Ref Range Status   Specimen Description BLOOD RIGHT ANTECUBITAL  Final   Special Requests   Final    BOTTLES DRAWN AEROBIC AND ANAEROBIC 6CC BLUE 5CC PURPLE   Culture NO GROWTH 5 DAYS  Final   Report Status 10/15/2015 FINAL  Final  Culture, blood (routine x 2)     Status: None   Collection Time: 10/10/15 11:01 PM  Result Value Ref Range Status   Specimen Description BLOOD LEFT ANTECUBITAL  Final   Special Requests   Final  BOTTLES DRAWN AEROBIC AND ANAEROBIC 6CC BLUE 5CC PURPLE   Culture NO GROWTH 5 DAYS  Final   Report Status 10/15/2015 FINAL  Final  Culture, expectorated sputum-assessment     Status: None   Collection Time: 10/11/15  4:14 AM  Result Value Ref Range Status   Specimen Description EXPECTORATED SPUTUM  Final   Special Requests NONE  Final   Sputum evaluation   Final    THIS SPECIMEN IS ACCEPTABLE. RESPIRATORY CULTURE REPORT TO FOLLOW.   Report Status 10/11/2015 FINAL  Final  Culture, respiratory (NON-Expectorated)     Status: None   Collection Time: 10/11/15  4:14 AM  Result Value Ref Range Status   Specimen Description EXPECTORATED SPUTUM  Final   Special Requests NONE  Final   Gram Stain   Final    MODERATE WBC PRESENT, PREDOMINANTLY PMN MODERATE SQUAMOUS  EPITHELIAL CELLS PRESENT ABUNDANT GRAM POSITIVE COCCI IN PAIRS IN CHAINS IN CLUSTERS FEW GRAM NEGATIVE RODS Performed at [facility]    Culture   Final    NORMAL OROPHARYNGEAL FLORA Performed at [facility]    Report Status 10/13/2015 FINAL  Final  C difficile quick scan w PCR reflex     Status: None   Collection Time: 10/13/15 12:50 PM  Result Value Ref Range Status   C Diff antigen NEGATIVE NEGATIVE Final   C Diff toxin NEGATIVE NEGATIVE Final   C Diff interpretation Negative for toxigenic C. difficile  Final  Culture, routine-abscess     Status: None   Collection Time: 10/17/15 12:14 PM  Result Value Ref Range Status   Specimen Description ABSCESS LEFT PLEURAL  Final   Special Requests NONE  Final   Gram Stain   Final    ABUNDANT WBC PRESENT,BOTH PMN AND MONONUCLEAR NO SQUAMOUS EPITHELIAL CELLS SEEN NO ORGANISMS SEEN Performed at [facility]    Culture   Final    MULTIPLE ORGANISMS PRESENT, NONE PREDOMINANT Note: NO STAPHYLOCOCCUS AUREUS ISOLATED NO GROUP A STREP (S.PYOGENES) ISOLATED Performed at [facility]    Report Status 10/20/2015 FINAL  Final    Studies/Results: Dg Chest 2 View  10/19/2015  CLINICAL DATA:  Empyema status post left chest tube EXAM: CHEST  2 VIEW COMPARISON:  10/16/2015 chest radiograph. FINDINGS: Left pleural catheter tip overlies the lower left lung. Stable cardiomediastinal silhouette with normal heart size. No right pneumothorax. There is a small to moderate lateral/basilar left hydro pneumothorax, which overall appears stable in size, with decreased fluid component and slightly increased pneumothorax component. No mediastinal shift. Persistent pleural thickening throughout the lateral and basilar left pleural space. Calcified pleural plaques overlie the lower lungs bilaterally. No pulmonary edema. Patchy opacity at the left lung base, stable. IMPRESSION: 1. Overall stable size of small moderate lateral/basilar  left hydropneumothorax status post left pleural catheter placement, with decreased fluid component and increased pneumothorax component, suggesting trapped left lung. No mediastinal shift. 2. Stable pleural thickening throughout the lateral/basilar left pleural space. Stable bilateral calcified pleural plaques. 3. Stable patchy opacity at the left lung base, nonspecific. Electronically Signed   By: Ilona Sorrel M.D.   On: 10/19/2015 12:12     Assessment/Plan: Fever- improved Empyema Loose BM ARF Hep C ab+, immune  Day 9 vanco/zosyn  His Cr is increasing. Will stop his vanco.  New chest tube 12-13 Repeat Cx polymicrobial- final. Remains afebrile.  Stool pathogens panel is negative. O & P negative.  Aim for 3 weeks of anbx, starting from 12-13 (  end date Nov 07, 2015) Path report from [facility] pending.   available as needed.          Bobby Rumpf Infectious Diseases (pager) [phone] [email or web address] 10/20/2015, 9:12 AM  LOS: 18 days

## 2015-10-20 NOTE — Progress Notes (Signed)
Pharmacy Antibiotic Follow-up Note  [name] [name] is a 54 y.o. year-old male admitted on 10/02/2015.  The patient is currently on vanc/Zosyn day 9 of 27 for empyema.   Temp (24hrs), Avg:99.7 F (37.6 C), Min:98.8 F (37.1 C), Max:101.2 F (38.4 C)   Recent Labs Lab 10/14/15 0315 10/16/15 0233  WBC 11.3* 10.1    Recent Labs Lab 10/14/15 0315 10/16/15 0233 10/18/15 0348 10/20/15 0422  CREATININE 1.15 1.09 1.38* 1.48*   Estimated Creatinine Clearance: 34.5 mL/min (by C-G formula based on Cr of 1.48).    No Known Allergies   Levels/dose changes this admission: Vanc 1g Q24H --> trough 6 Vanc 1g Q12H --> trough 32    Assessment/Plan: Pt is supratherapeutic on vancomycin for empyema; note that pm dose of vanc was not charted 12/15 and it is not clear if this level was drawn 12hr vs 24hr after last dose.  Will obtain another vanc level this afternoon.  Zosyn remains appropriate.   Thank you for allowing pharmacy to be a part of this patient's care.  Wynona Neat, PharmD, BCPS  10/20/2015 5:15 AM

## 2015-10-20 NOTE — Progress Notes (Signed)
Pt is stable. Prescriptions have been given to the patient. Pt has been educated on follow up appointments and importance of medication compliance. Telemetry has been D/C. Pt verbally agreed with discharge teachings.

## 2015-10-20 NOTE — Care Management Note (Signed)
Case Management Note Previous CM note initiated by Whitman Hero RN, CM  Patient Details  Name: Riccardo Hendren MRN: [id number] Date of Birth: June 29, 1961  Subjective/Objective:                 Admitted with Left plerual effusion,  s/p VIDEO ASSISTED THORACOSCOPY (VATS)/DECORTICATION and drainage of chronic empyena (Left) 10/02/15. VIDEO BRONCHOSCOPY.Pt is [place] however speaks some english. Resides with girlfriend. Independent with ADL's. No DME usage.    Action/Plan: Return to home when medically stable. CM to f/u with disposition needs.  Expected Discharge Date:    10/20/15              Expected Discharge Plan:  [place]  In-House Referral:     Discharge planning Services  CM Consult  Post Acute Care Choice:  Home Health Choice offered to:  Patient  DME Arranged:    DME Agency:     HH Arranged:  RN, IV Antibiotics HH Agency:  [facility]  Status of Service:  Completed, signed off  Medicare Important Message Given:    Date Medicare IM Given:    Medicare IM give by:    Date Additional Medicare IM Given:    Additional Medicare Important Message give by:     If discussed at [place] of Stay Meetings, dates discussed:  10/19/15  Discharge Disposition: Home/Home Health   Additional Comments:   10/20/15- pt for d/c home today, spoke with pt at bedside- discussed [place] needs and arrangements- pt agreeable to [facility] with [facility] for IV abx and CT care- spoke with Katie at [facility] regarding potential d/c later today- awaiting final abx plan-  Update- spoke with Lelan Pons with [facility]- pt to go home on Zosyn- script on chart- [facility] to follow for home IV abx and chest tube care- d/c order placed for today.   10/19/15- pt now with pigtail CT placed by IR- to suction- per ID will need a total of 3 wks of IV abx- order for PICC placed-spoke with pt about d/c needs and HH- does not have preference for agency -pt agreeable to [facility]- have spoken with Pam from [facility] regarding home IV  abx needs- [facility] will follow for discharge needs- unsure if pt will go home with CT at this time. Will need HH orders for RN (iv abx) when ready for discharge.   Marvetta Gibbons [place], [place] [phone] 10/20/2015, 2:32 PM

## 2015-10-20 NOTE — Progress Notes (Signed)
Patient ID: [name] [name], male   DOB: 1961-07-14, 54 y.o.   MRN: [id number]         Subjective: Pt doing ok; no new c/o; loose stools stopped, c diff neg   Allergies: Review of patient's allergies indicates no known allergies.  Medications: Prior to Admission medications   Not on File     Vital Signs: BP 105/75 mmHg  Pulse 94  Temp(Src) 99.1 F (37.3 C) (Oral)  Resp 18  Ht 5' (1.524 m)  Wt 92 lb 15.8 oz (42.18 kg)  BMI 18.16 kg/m2  SpO2 94%  Physical Exam left chest tube intact, insertion site clean and dry, mildly tender, now attached to mini express, no fluid in container; cx's - mult org  Imaging: Dg Chest 2 View  10/19/2015  CLINICAL DATA:  Empyema status post left chest tube EXAM: CHEST  2 VIEW COMPARISON:  10/16/2015 chest radiograph. FINDINGS: Left pleural catheter tip overlies the lower left lung. Stable cardiomediastinal silhouette with normal heart size. No right pneumothorax. There is a small to moderate lateral/basilar left hydro pneumothorax, which overall appears stable in size, with decreased fluid component and slightly increased pneumothorax component. No mediastinal shift. Persistent pleural thickening throughout the lateral and basilar left pleural space. Calcified pleural plaques overlie the lower lungs bilaterally. No pulmonary edema. Patchy opacity at the left lung base, stable. IMPRESSION: 1. Overall stable size of small moderate lateral/basilar left hydropneumothorax status post left pleural catheter placement, with decreased fluid component and increased pneumothorax component, suggesting trapped left lung. No mediastinal shift. 2. Stable pleural thickening throughout the lateral/basilar left pleural space. Stable bilateral calcified pleural plaques. 3. Stable patchy opacity at the left lung base, nonspecific. Electronically Signed   By: Ilona Sorrel M.D.   On: 10/19/2015 12:12   Ct Image Guided Drainage By Percutaneous Catheter  10/17/2015  CLINICAL DATA:   54 year old male with a history of left-sided VATS procedure and subsequent effusion/possible empyema. He has been referred for evaluation and possible chest tube drainage. EXAM: CT GUIDED DRAINAGE OF LEFT PLEURAL ABSCESS/EMPYEMA ANESTHESIA/SEDATION: 0.5 Mg IV Versed 25 mcg IV Fentanyl Total Moderate Sedation Time:  20 minutes PROCEDURE: The procedure, risks, benefits, and alternatives were explained to the patient. Questions regarding the procedure were encouraged and answered. The patient understands and consents to the procedure. Patient was positioned in left decubitus and a scout CT of the chest was performed for planning purposes. The posterior left chest was prepped with chlorhexidinein a sterile fashion, and a sterile drape was applied covering the operative field. A sterile gown and sterile gloves were used for the procedure. Local anesthesia was provided with 1% Lidocaine. Once the patient is prepped and draped in the usual sterile fashion, the skin and subcutaneous tissues were generously infiltrated with 1% lidocaine for local anesthesia to the level of the pleura. A stab incision was made with 11 blade scalpel, and a Yueh needle was advanced under CT guidance into the pleural space. Aspiration of purulent fluid confirmed position. CT image confirmed position. Using modified Seldinger technique and dilation of the soft tissue tract, a 12 French pigtail catheter was placed into the pleural space. Sample was aspirated for lab evaluation. The catheter was sutured in position, final image was stored, and the catheter was attached to atrium water seal apparatus. Patient tolerated the procedure well and remained hemodynamically stable throughout. No complications were encountered and no significant blood loss was encountered. COMPLICATIONS: None FINDINGS: Scout CT demonstrates left-sided complex and loculated pleural fluid, with the  majority of the fluid in the dependent aspects of the chest. Twelve French  pigtail catheter positioned into the drain via posterior approach. IMPRESSION: Status post CT-guided drainage of presumed left-sided empyema, with 12 French drainage catheter. Sample was sent to the lab for analysis. Signed, Dulcy Fanny. Earleen Newport, DO [facility] Rad[facility] Electronically Signed   By: Corrie Mckusick D.O.   On: 10/17/2015 09:59    Labs:  CBC:  Recent Labs  10/11/15 1044 10/13/15 0226 10/14/15 0315 10/16/15 0233  WBC 14.6* 19.0* 11.3* 10.1  HGB 12.8* 11.9* 10.7* 11.0*  HCT 38.9* 34.9* 32.4* 33.2*  PLT 385 371 364 406*    COAGS:  Recent Labs  09/26/15 1437 10/16/15 0854  INR 1.16 1.18  APTT 32 34    BMP:  Recent Labs  10/14/15 0315 10/16/15 0233 10/18/15 0348 10/20/15 0422  NA 135 135 135 134*  K 3.5 3.8 3.6 4.2  CL 98* 98* 96* 97*  CO2 29 30 32 30  GLUCOSE 111* 112* 112* 101*  BUN 15 9 8 7   CALCIUM 8.4* 8.3* 8.4* 8.2*  CREATININE 1.15 1.09 1.38* 1.48*  GFRNONAA >60 >60 57* 52*  GFRAA >60 >60 >60 >60    LIVER FUNCTION TESTS:  Recent Labs  09/25/15 2106 09/30/15 1630 10/04/15 0520 10/16/15 0233  BILITOT 0.5 0.3 0.3 0.7  AST 30 55* 39 46*  ALT 22 36 41 53  ALKPHOS 80 95 51 77  PROT 8.9* 11.3* 6.6 7.4  ALBUMIN 2.7* 3.6 2.1* 2.0*    Assessment and Plan:  S/p left pleural drain 12/13 secondary to loc left effusion/? empyema; per TCTS pt to possibly  go home today with mini express ,f/u with Dr. Servando Snare next week for poss drain removal  Signed: D. Rowe Robert 10/20/2015, 11:37 AM   I spent a total of 15 minutes at the the patient's bedside AND on the patient's hospital floor or unit, greater than 50% of which was counseling/coordinating care for left chest drain

## 2015-10-23 ENCOUNTER — Other Ambulatory Visit: Payer: Self-pay | Admitting: Cardiothoracic Surgery

## 2015-10-23 DIAGNOSIS — I313 Pericardial effusion (noninflammatory): Secondary | ICD-10-CM

## 2015-10-23 DIAGNOSIS — I3139 Other pericardial effusion (noninflammatory): Secondary | ICD-10-CM

## 2015-10-24 ENCOUNTER — Ambulatory Visit: Payer: Self-pay | Admitting: Cardiothoracic Surgery

## 2015-10-26 ENCOUNTER — Telehealth: Payer: Self-pay

## 2015-10-26 ENCOUNTER — Ambulatory Visit: Payer: Self-pay | Admitting: Cardiothoracic Surgery

## 2015-10-26 NOTE — Telephone Encounter (Signed)
Unable to reach patient at listed phone numbers. He No Showed his appt today. LMOM to call back and reschedule ASAP

## 2015-10-27 ENCOUNTER — Emergency Department (HOSPITAL_COMMUNITY)
Admission: EM | Admit: 2015-10-27 | Discharge: 2015-10-27 | Disposition: A | Discharge status: Home / Self Care | Payer: BLUE CROSS/BLUE SHIELD | Attending: Emergency Medicine | Admitting: Emergency Medicine

## 2015-10-27 ENCOUNTER — Encounter (HOSPITAL_COMMUNITY): Payer: Self-pay | Admitting: Emergency Medicine

## 2015-10-27 ENCOUNTER — Emergency Department (HOSPITAL_COMMUNITY): Payer: BLUE CROSS/BLUE SHIELD

## 2015-10-27 DIAGNOSIS — J439 Emphysema, unspecified: Secondary | ICD-10-CM | POA: Insufficient documentation

## 2015-10-27 DIAGNOSIS — Z87442 Personal history of urinary calculi: Secondary | ICD-10-CM | POA: Insufficient documentation

## 2015-10-27 DIAGNOSIS — Z4803 Encounter for change or removal of drains: Secondary | ICD-10-CM | POA: Insufficient documentation

## 2015-10-27 DIAGNOSIS — I10 Essential (primary) hypertension: Secondary | ICD-10-CM | POA: Diagnosis not present

## 2015-10-27 DIAGNOSIS — R0602 Shortness of breath: Secondary | ICD-10-CM | POA: Diagnosis present

## 2015-10-27 DIAGNOSIS — Z4682 Encounter for fitting and adjustment of non-vascular catheter: Secondary | ICD-10-CM

## 2015-10-27 DIAGNOSIS — J869 Pyothorax without fistula: Secondary | ICD-10-CM

## 2015-10-27 MED ORDER — GUAIFENESIN-DM 100-10 MG/5ML PO SYRP: 5.0000 mL | ORAL_SOLUTION | ORAL | Status: DC | PRN

## 2015-10-27 NOTE — ED Notes (Signed)
Pt left with all his belongings and ambulated out of the treatment area.  

## 2015-10-27 NOTE — ED Notes (Signed)
Pt has appt on january 5th to have his chest tube removed. Pt states it is no longer draining and would like to have it removed. Pt had it placed 2 weeks ago and was sent home with it in place.

## 2015-10-27 NOTE — Discharge Instructions (Signed)
Please follow-up with Dr. Servando Snare on Tuesday. Return if any issues with the chest tube wound.

## 2015-10-27 NOTE — ED Provider Notes (Signed)
CSN: [id number]     Arrival date & time 10/27/15  1834 History   First MD Initiated Contact with Patient 10/27/15 1943     Chief Complaint  Patient presents with  . Follow-up     (Consider location/radiation/quality/duration/timing/severity/associated sxs/prior Treatment) HPI [name] [name] is a 54 y.o. male with history of hypertension, recent empyema with chest tube placement, presents to emergency department asking for chest tube removal. Patient states that he has had no drainage out of the chest tube. He states his appointment is not for another 2 weeks. Patient actually missed an appointment based on Epic notes, patient states he did not know that he had one. Patient states his shortness of breath is better. He states he is having some cough. Denies any other complaints. Patient states he wants his tube out because it's uncomfortable, painful, he is unable to sleep due to discomfort.  Past Medical History  Diagnosis Date  . Kidney stones   . Hypertension    Past Surgical History  Procedure Laterality Date  . Kidney surgery    . Video assisted thoracoscopy (vats)/decortication Left 2003  . Video assisted thoracoscopy (vats)/decortication Left 10/02/2015    Procedure: VIDEO ASSISTED THORACOSCOPY (VATS)/DECORTICATION and drainage of chronic empyena;  Surgeon: [name], MD;  Location: [facility];  Service: Thoracic;  Laterality: Left;  . Video bronchoscopy N/A 10/02/2015    Procedure: VIDEO BRONCHOSCOPY;  Surgeon: [name], MD;  Location: [facility] OR;  Service: Thoracic;  Laterality: N/A;   No family history on file. Social History  Substance Use Topics  . Smoking status: Never Smoker   . Smokeless tobacco: None  . Alcohol Use: Yes    Review of Systems  Constitutional: Negative for fever and chills.  Respiratory: Positive for cough and shortness of breath. Negative for chest tightness.   Cardiovascular: Negative for chest pain, palpitations and leg swelling.   Gastrointestinal: Negative for nausea, vomiting, abdominal pain, diarrhea and abdominal distention.  Musculoskeletal: Negative for myalgias, arthralgias, neck pain and neck stiffness.  Skin: Negative for rash.  Allergic/Immunologic: Negative for immunocompromised state.  Neurological: Negative for dizziness, weakness, light-headedness, numbness and headaches.      Allergies  Review of patient's allergies indicates no known allergies.  Home Medications   Prior to Admission medications   Medication Sig Start Date End Date Taking? Authorizing Provider  oxyCODONE (OXY IR/ROXICODONE) 5 MG immediate release tablet Take 1 tablet (5 mg total) by mouth every 4 (four) hours as needed for severe pain. 10/20/15   Donielle Liston Alba, PA-C  piperacillin-tazobactam (ZOSYN) 3.375 GM/50ML IVPB Inject 50 mLs (3.375 g total) into the vein every 8 (eight) hours. Last dose is to be give on 11/07/2015. 10/20/15   Donielle M Tacy Dura, PA-C   BP 154/94 mmHg  Pulse 91  Temp(Src) 99.2 F (37.3 C) (Oral)  Resp 17  SpO2 96% Physical Exam  Constitutional: He is oriented to person, place, and time. He appears well-developed and well-nourished. No distress.  HENT:  Head: Normocephalic and atraumatic.  Eyes: Conjunctivae are normal.  Neck: Neck supple.  Cardiovascular: Normal rate, regular rhythm and normal heart sounds.   Pulmonary/Chest: Effort normal. No respiratory distress. He has no wheezes. He has no rales. He exhibits no tenderness.  Healed incision with sutures intact to the left anterior chest. Chest tube placed in the left posterior lower chest, no evidence of infection. No drainage in the chest tube canister.  Abdominal: Soft. Bowel sounds are normal. He exhibits no distension. There is  no tenderness. There is no rebound.  Musculoskeletal: He exhibits no edema.  Neurological: He is alert and oriented to person, place, and time.  Skin: Skin is warm and dry.  Nursing note and vitals  reviewed.   ED Course  Procedures (including critical care time) Labs Review Labs Reviewed - No data to display  Imaging Review Dg Chest 2 View  10/27/2015  CLINICAL DATA:  The patient has an appointment on January 5th to have drainage tube removed. Patient states the drain is no longer draining fluid and wanted it removed now. EXAM: CHEST  2 VIEW COMPARISON:  October 20, 2015 FINDINGS: The heart size and mediastinal contours are stable. Right central venous line is unchanged. Left lower lateral chest strain is identified with small left pleural effusion unchanged compared prior exam. The right lung is hyperinflated. There is minimal right pleural effusion unchanged. The visualized skeletal structures are unremarkable. IMPRESSION: Persistent small left pleural effusion with chest drain in place, unchanged compared to October 20, 2015 Electronically Signed   By: Abelardo Diesel M.D.   On: 10/27/2015 20:25   I have personally reviewed and evaluated these images and lab results as part of my medical decision-making.   EKG Interpretation None      MDM   Final diagnoses:  Empyema ([place])  Encounter for chest tube removal   I spoke with Dr. Servando Snare, patient's cardiothoracic surgeon, he again told me that patient missed his appointment yesterday. They attempted to call in the patient's phone was off. He has 4 chest x-ray and asked for [place] to pull the tube out. He will need to follow-up with him on Tuesday to call for follow-up appointment.  9:05 PM Patient's chest x-ray is unchanged. With hooked up chest tube to suction, with no drainage. There has not been any drainage in the tube for several days. I called back and discussed patient with Dr. Cyndia Bent, who said that we can either leave the chest tube in or pull it out since is not draining anyways. There does not appear to be any leaks in the tubing. The check mark on the chest drain is present indicating good suction. Patient is requesting chest  tube to be pulled out.  10:36 PM  after cleaning the area, using sterile procedure, pulled the chest tube. shortly after pulling the chest tube he developed purulent drainage out of the chest tube wound. Pressure applied. Will monitor patient.   Drainage stopped. Patient denies any shortness of breath. He states he feels much better now. Will discharge home with close follow-up with Dr. Madolyn Frieze. Pt voiced understanding. He is to go to the office on Tuesday.   Filed Vitals:   10/27/15 2200 10/27/15 2215 10/27/15 2230 10/27/15 2245  BP: 141/97 137/100 138/100 119/106  Pulse: 87 86 90 90  Temp:      TempSrc:      Resp:      SpO2: 99% 99% 99% 98%     Jeannett Senior, PA-C 10/27/15 2304  Daleen Bo, MD 10/27/15 2348

## 2015-10-30 LAB — FUNGUS CULTURE W SMEAR: Fungal Smear: NONE SEEN

## 2015-11-01 ENCOUNTER — Encounter (HOSPITAL_COMMUNITY): Payer: Self-pay

## 2015-11-01 ENCOUNTER — Encounter (HOSPITAL_COMMUNITY): Payer: Self-pay | Admitting: Emergency Medicine

## 2015-11-01 ENCOUNTER — Emergency Department (HOSPITAL_COMMUNITY)
Admission: EM | Admit: 2015-11-01 | Discharge: 2015-11-01 | Disposition: A | Discharge status: Home / Self Care | Payer: BLUE CROSS/BLUE SHIELD | Attending: Emergency Medicine | Admitting: Emergency Medicine

## 2015-11-01 DIAGNOSIS — Z87442 Personal history of urinary calculi: Secondary | ICD-10-CM | POA: Insufficient documentation

## 2015-11-01 DIAGNOSIS — Z4801 Encounter for change or removal of surgical wound dressing: Secondary | ICD-10-CM | POA: Insufficient documentation

## 2015-11-01 DIAGNOSIS — I10 Essential (primary) hypertension: Secondary | ICD-10-CM | POA: Insufficient documentation

## 2015-11-01 DIAGNOSIS — Z5189 Encounter for other specified aftercare: Secondary | ICD-10-CM

## 2015-11-01 NOTE — Discharge Instructions (Signed)
Follow-up with Dr. Servando Snare at your scheduled appointment on January 4th at 4 PM. Return to the emergency department if symptoms worsen or new onset fever, drainage, difficulty breathing, productive cough, coughing up blood, chest pain.

## 2015-11-01 NOTE — ED Provider Notes (Signed)
CSN: [id number]     Arrival date & time 11/01/15  1058 History   First MD Initiated Contact with Patient 11/01/15 1616     Chief Complaint  Patient presents with  . Wound Check     (Consider location/radiation/quality/duration/timing/severity/associated sxs/prior Treatment) HPI   Patient is a 54 year old male with history of hypertension and recent empyema requiring chest tube placement presents to the ED with complaint of wound recheck. Patient states he was scheduled to see Dr. Servando Snare yesterday for follow-up but notes he was unable to get transportation. Endorses having mild nonproductive cough. Denies fever, chills, difficulty breathing, shortness of breath, chest pain, drainage, abdominal pain, nausea, vomiting, rash. Patient states he is feeling better and denies any other complaints at this time.  Past Medical History  Diagnosis Date  . Kidney stones   . Hypertension    Past Surgical History  Procedure Laterality Date  . Kidney surgery    . Video assisted thoracoscopy (vats)/decortication Left 2003  . Video assisted thoracoscopy (vats)/decortication Left 10/02/2015    Procedure: VIDEO ASSISTED THORACOSCOPY (VATS)/DECORTICATION and drainage of chronic empyena;  Surgeon: [name], MD;  Location: [place];  Service: Thoracic;  Laterality: Left;  . Video bronchoscopy N/A 10/02/2015    Procedure: VIDEO BRONCHOSCOPY;  Surgeon: [name], MD;  Location: [facility] OR;  Service: Thoracic;  Laterality: N/A;   No family history on file. Social History  Substance Use Topics  . Smoking status: Never Smoker   . Smokeless tobacco: None  . Alcohol Use: Yes    Review of Systems  All other systems reviewed and are negative.     Allergies  Review of patient's allergies indicates no known allergies.  Home Medications   Prior to Admission medications   Medication Sig Start Date End Date Taking? Authorizing Provider  guaiFENesin-dextromethorphan (ROBITUSSIN DM) 100-10  MG/5ML syrup Take 5 mLs by mouth every 4 (four) hours as needed for cough. 10/27/15   Tatyana Kirichenko, PA-C  piperacillin-tazobactam (ZOSYN) 3.375 GM/50ML IVPB Inject 50 mLs (3.375 g total) into the vein every 8 (eight) hours. Last dose is to be give on 11/07/2015. 10/20/15   Donielle M Tacy Dura, PA-C   BP 139/84 mmHg  Pulse 80  Temp(Src) 97.9 F (36.6 C) (Oral)  Resp 18  SpO2 99% Physical Exam  Constitutional: He is oriented to person, place, and time. He appears well-developed and well-nourished. No distress.  HENT:  Head: Normocephalic and atraumatic.  Mouth/Throat: Oropharynx is clear and moist. No oropharyngeal exudate.  Eyes: Conjunctivae and EOM are normal. Right eye exhibits no discharge. Left eye exhibits no discharge. No scleral icterus.  Neck: Normal range of motion. Neck supple.  Cardiovascular: Normal rate, regular rhythm, normal heart sounds and intact distal pulses.   Pulmonary/Chest: Effort normal and breath sounds normal. No respiratory distress. He has no wheezes. He has no rales. He exhibits no tenderness.  Healed incision with suture intact to left anterior/lateral chest wall. Healing wound (prior site of chest tube) to left posterior lower chest wall, no drainage or surrounding erythema/swelling, no TTP.  Abdominal: Soft. Bowel sounds are normal. He exhibits no distension and no mass. There is no tenderness. There is no rebound and no guarding.  Musculoskeletal: He exhibits no edema.  Neurological: He is alert and oriented to person, place, and time.  Skin: Skin is warm and dry.  Nursing note and vitals reviewed.   ED Course  Procedures (including critical care time) Labs Review Labs Reviewed - No data to display  Imaging Review No results found. I have personally reviewed and evaluated these images and lab results as part of my medical decision-making.   EKG Interpretation None      MDM   Final diagnoses:  Encounter for wound re-check    Patient  presents for wound check. He states he missed his scheduled appointment with Dr. Richardine Service yesterday. Denies any complaints. VSS. Exam revealed healed incision with suture intact to left anterior lateral chest wall, healing wound to left posterior chest wall from site of chest tube, no evidence of cellulitis or infection, lungs CTAB.   Nurse reports she received a call from Dr. Everrett Coombe nurse at his clinic who reports that the patient has missed multiple follow-up appointments. She reports that the patient has been rescheduled for a new appointment on January 4 and advised to have the patient be seen by Dr. Servando Snare at that time for follow-up. Discussed plan for discharge and follow-up with Dr. Servando Snare at his new scheduled appointment.  Evaluation does not show pathology requring ongoing emergent intervention or admission. Pt is hemodynamically stable and mentating appropriately. All questions answered. Return precautions discussed and outpatient follow up given.    Chesley Noon [place], [place] 11/01/15 2051  Ripley Fraise, MD 11/01/15 2123

## 2015-11-01 NOTE — ED Notes (Signed)
Old dressing removed minimal drainage on the gauze .  Area looks well healed.  Area cleaned with sur-clens  dsd applied with gauze and taped.  Instructions given about his office visit with dr gerhardt.  Time and date

## 2015-11-01 NOTE — ED Notes (Signed)
The pt has a bandage on the posterior lt chest.  He has a picc line in his rt arm  He reports he gives himself med through that at home.  No drainage on the bandage

## 2015-11-01 NOTE — ED Notes (Signed)
Pt from home for eval of wound check, states had chest tube recently taken out in ED after missing appt and missed appt yesterday so is here for dressing change and wound check. Pt denies any new pain or sob. PICC line noted to right arm, states has been taking home meds as prescribed. Pt in nad, airway intact, no signs of infection noted.

## 2015-11-07 ENCOUNTER — Encounter: Payer: Self-pay | Admitting: *Deleted

## 2015-11-08 ENCOUNTER — Ambulatory Visit: Payer: BLUE CROSS/BLUE SHIELD | Admitting: Cardiothoracic Surgery

## 2015-11-08 ENCOUNTER — Encounter: Payer: Self-pay | Admitting: Cardiothoracic Surgery

## 2015-11-08 ENCOUNTER — Ambulatory Visit (INDEPENDENT_AMBULATORY_CARE_PROVIDER_SITE_OTHER): Payer: Self-pay | Admitting: Cardiothoracic Surgery

## 2015-11-08 ENCOUNTER — Ambulatory Visit
Admission: RE | Admit: 2015-11-08 | Discharge: 2015-11-08 | Disposition: A | Discharge status: Home / Self Care | Payer: BLUE CROSS/BLUE SHIELD | Source: Ambulatory Visit | Attending: Cardiothoracic Surgery | Admitting: Cardiothoracic Surgery

## 2015-11-08 VITALS — BP 160/100 | HR 90 | Resp 20 | Ht 60.0 in | Wt 90.0 lb

## 2015-11-08 DIAGNOSIS — I313 Pericardial effusion (noninflammatory): Secondary | ICD-10-CM

## 2015-11-08 DIAGNOSIS — J869 Pyothorax without fistula: Secondary | ICD-10-CM

## 2015-11-08 DIAGNOSIS — I3139 Other pericardial effusion (noninflammatory): Secondary | ICD-10-CM

## 2015-11-08 NOTE — Progress Notes (Signed)
      [address]       [place] [address]             [phone]      Serjio Laroque [place] Medical Record [id number] Date of Birth: 1961-08-29  Referring: Lacretia Leigh, MD Primary Care: London Pepper, MD  Chief Complaint:   POST OP FOLLOW UP 10/02/2015  OPERATIVE REPORT PREOPERATIVE DIAGNOSIS: Left pleural effusion. POSTOPERATIVE DIAGNOSIS: Left chronic empyema. PROCEDURE PERFORMED: Video bronchoscopy, left video-assisted thoracoscopy, mini thoracotomy, drainage of empyema with decortication. SURGEON: Lanelle Bal, MD  History of Present Illness:     Patient comes to office after missing previous appointments and going to ER instead. His chest tube was removed by the ER. He denies fever and chills, Still has PIC line in place says antbiotics ended yesterday and to have pic out Thursday    Past Medical History  Diagnosis Date  . Kidney stones   . Hypertension      History  Smoking status  . Never Smoker   Smokeless tobacco  . Not on file    History  Alcohol Use  . Yes     No Known Allergies  No current outpatient prescriptions on file.   No current facility-administered medications for this visit.       Physical Exam: BP 160/100 mmHg  Pulse 90  Resp 20  Ht 5' (1.524 m)  Wt 90 lb (40.824 kg)  BMI 17.58 kg/m2  SpO2 95%  General appearance: alert, cooperative and appears stated age Neurologic: intact Heart: regular rate and rhythm, S1, S2 normal, no murmur, click, rub or gallop Lungs: diminished breath sounds LLL Abdomen: soft, non-tender; bowel sounds normal; no masses,  no organomegaly Extremities: extremities normal, atraumatic, no cyanosis or edema Wound: wounds well healed   Diagnostic Studies & Laboratory data:     Recent Radiology Findings:   Dg Chest 2 View  11/08/2015  CLINICAL DATA:  Pericardial effusion, cough, hemoptysis, post VATS November 2016, history hypertension EXAM: CHEST  2 VIEW COMPARISON:   10/27/2015 FINDINGS: Normal heart size and mediastinal contours. Prominent central pulmonary arteries question pulmonary arterial hypertension. Interval removal of pigtail LEFT thoracostomy drainage catheter. Persistent pleural effusion atelectasis at LEFT base. Underlying emphysematous changes. Hyperinflation at RIGHT base stable. Pleural calcifications inferior RIGHT chest stable. No new infiltrate, RIGHT pleural effusion or pneumothorax. Bones unremarkable. IMPRESSION: COPD changes with stable pleural calcifications in RIGHT chest. Persistent loculated LEFT pleural effusion with LEFT basilar atelectasis. No pneumothorax following LEFT thoracostomy tube removal. Electronically Signed   By: Lavonia Dana M.D.   On: 11/08/2015 16:37      Recent Lab Findings: Lab Results  Component Value Date   WBC 10.1 10/16/2015   HGB 11.0* 10/16/2015   HCT 33.2* 10/16/2015   PLT 406* 10/16/2015   GLUCOSE 101* 10/20/2015   ALT 53 10/16/2015   AST 46* 10/16/2015   NA 134* 10/20/2015   K 4.2 10/20/2015   CL 97* 10/20/2015   CREATININE 1.48* 10/20/2015   BUN 7 10/20/2015   CO2 30 10/20/2015   INR 1.18 10/16/2015      Assessment / Plan:     Patient improving, chest xray is improved from in hospital,  Monitor off antibiotics , follow up chest xray Jan 19,   [name] MD      [address] [place] [address] Office [phone]   Beeper [phone]  11/08/2015 5:06 PM

## 2015-11-13 ENCOUNTER — Encounter (HOSPITAL_COMMUNITY): Payer: Self-pay

## 2015-11-14 LAB — AFB CULTURE WITH SMEAR (NOT AT ARMC): Acid Fast Smear: NONE SEEN

## 2015-11-15 LAB — AFB CULTURE WITH SMEAR (NOT AT ARMC): Acid Fast Smear: NONE SEEN

## 2015-11-22 ENCOUNTER — Other Ambulatory Visit: Payer: Self-pay | Admitting: Cardiothoracic Surgery

## 2015-11-22 DIAGNOSIS — J90 Pleural effusion, not elsewhere classified: Secondary | ICD-10-CM

## 2015-11-23 ENCOUNTER — Ambulatory Visit
Admission: RE | Admit: 2015-11-23 | Discharge: 2015-11-23 | Disposition: A | Discharge status: Home / Self Care | Payer: BLUE CROSS/BLUE SHIELD | Source: Ambulatory Visit | Attending: Cardiothoracic Surgery | Admitting: Cardiothoracic Surgery

## 2015-11-23 ENCOUNTER — Encounter: Payer: Self-pay | Admitting: Cardiothoracic Surgery

## 2015-11-23 ENCOUNTER — Ambulatory Visit (INDEPENDENT_AMBULATORY_CARE_PROVIDER_SITE_OTHER): Payer: Self-pay | Admitting: Cardiothoracic Surgery

## 2015-11-23 VITALS — BP 160/103 | HR 95 | Resp 16 | Ht 60.0 in | Wt 90.0 lb

## 2015-11-23 DIAGNOSIS — J90 Pleural effusion, not elsewhere classified: Secondary | ICD-10-CM

## 2015-11-23 DIAGNOSIS — J869 Pyothorax without fistula: Secondary | ICD-10-CM

## 2015-11-23 DIAGNOSIS — Z09 Encounter for follow-up examination after completed treatment for conditions other than malignant neoplasm: Secondary | ICD-10-CM

## 2015-11-23 NOTE — Progress Notes (Signed)
[address]       [place] [address]             [phone]      [name] [name] Medical Record [id number] Date of Birth: 1961-10-31  Referring: [name] Leigh, MD Primary Care: No primary care provider on file.  Chief Complaint:   POST OP FOLLOW UP 10/02/2015  OPERATIVE REPORT PREOPERATIVE DIAGNOSIS: Left pleural effusion. POSTOPERATIVE DIAGNOSIS: Left chronic empyema. PROCEDURE PERFORMED: Video bronchoscopy, left video-assisted thoracoscopy, mini thoracotomy, drainage of empyema with decortication. SURGEON: [name] Bal, MD  History of Present Illness:     Patient comes to office after missing previous appointments and going to ER instead. His chest tube was removed by the ER. He denies fever and chills, his PICC line has has been removed, is currently off antibiotics He denies fever chills He's had some episodes of minor hemoptysis   Past Medical History  Diagnosis Date  . Kidney stones   . Hypertension      History  Smoking status  . Never Smoker   Smokeless tobacco  . Not on file    History  Alcohol Use  . Yes     No Known Allergies  No current outpatient prescriptions on file.   No current facility-administered medications for this visit.       Physical Exam: BP 160/103 mmHg  Pulse 95  Resp 16  Ht 5' (1.524 m)  Wt 90 lb (40.824 kg)  BMI 17.58 kg/m2  SpO2   General appearance: alert, cooperative and appears stated age Neurologic: intact Heart: regular rate and rhythm, S1, S2 normal, no murmur, click, rub or gallop Lungs: diminished breath sounds LLL Abdomen: soft, non-tender; bowel sounds normal; no masses,  no organomegaly Extremities: extremities normal, atraumatic, no cyanosis or edema Wound: wounds well healed Patient's PICC line has been removed  Diagnostic Studies & Laboratory data:     Recent Radiology Findings:   Dg Chest 2 View  11/23/2015  CLINICAL DATA:  Empyema, pleural effusion,  shortness of breath, history of VATS November 2016 EXAM: CHEST  2 VIEW COMPARISON:  11/08/2015 FINDINGS: Trace right pleural effusion. Loculated small left pleural effusion. Patchy left lower lobe airspace disease likely reflecting atelectasis. Calcified right pleural plaque. There is no pneumothorax. There is enlargement of the right pulmonary vasculature. Stable cardiomediastinal silhouette. There is no acute osseous abnormality. IMPRESSION: 1. Mildly loculated left pleural effusion. 2. Trace right pleural effusion. Electronically Signed   By: [name] [name]   On: 11/23/2015 15:55      Recent Lab Findings: Lab Results  Component Value Date   WBC 10.1 10/16/2015   HGB 11.0* 10/16/2015   HCT 33.2* 10/16/2015   PLT 406* 10/16/2015   GLUCOSE 101* 10/20/2015   ALT 53 10/16/2015   AST 46* 10/16/2015   NA 134* 10/20/2015   K 4.2 10/20/2015   CL 97* 10/20/2015   CREATININE 1.48* 10/20/2015   BUN 7 10/20/2015   CO2 30 10/20/2015   INR 1.18 10/16/2015      Assessment / Plan:   Patient improving, chest xray is improved from in hospital,  Monitor off antibiotics , PICC line now out Patient continues to have some episodes of minor hemoptysis, we'll follow-up with a CT scan of the chest in 2 weeks with his persistent hypertension on each visit here we referred him to the [facility] for primary care and treatment of hypertension    [name] Isaac  MD      [address] [place] [address] Office [phone]   Beeper [phone]  11/23/2015 5:05 PM

## 2015-11-24 ENCOUNTER — Other Ambulatory Visit: Payer: Self-pay | Admitting: *Deleted

## 2015-11-24 DIAGNOSIS — R042 Hemoptysis: Secondary | ICD-10-CM

## 2015-11-24 DIAGNOSIS — J869 Pyothorax without fistula: Secondary | ICD-10-CM

## 2015-12-08 ENCOUNTER — Other Ambulatory Visit: Payer: BLUE CROSS/BLUE SHIELD

## 2015-12-14 ENCOUNTER — Encounter: Payer: BLUE CROSS/BLUE SHIELD | Admitting: Cardiothoracic Surgery

## 2015-12-14 ENCOUNTER — Ambulatory Visit
Admission: RE | Admit: 2015-12-14 | Discharge: 2015-12-14 | Disposition: A | Discharge status: Home / Self Care | Payer: BLUE CROSS/BLUE SHIELD | Source: Ambulatory Visit | Attending: Cardiothoracic Surgery | Admitting: Cardiothoracic Surgery

## 2015-12-14 DIAGNOSIS — R042 Hemoptysis: Secondary | ICD-10-CM

## 2015-12-14 DIAGNOSIS — J869 Pyothorax without fistula: Secondary | ICD-10-CM

## 2015-12-21 ENCOUNTER — Ambulatory Visit (INDEPENDENT_AMBULATORY_CARE_PROVIDER_SITE_OTHER): Payer: Self-pay | Admitting: Cardiothoracic Surgery

## 2015-12-21 ENCOUNTER — Other Ambulatory Visit: Payer: Self-pay

## 2015-12-21 ENCOUNTER — Encounter: Payer: Self-pay | Admitting: Cardiothoracic Surgery

## 2015-12-21 VITALS — BP 140/87 | HR 92 | Resp 20 | Ht 60.0 in | Wt 92.0 lb

## 2015-12-21 DIAGNOSIS — J869 Pyothorax without fistula: Secondary | ICD-10-CM

## 2015-12-21 DIAGNOSIS — J90 Pleural effusion, not elsewhere classified: Secondary | ICD-10-CM

## 2015-12-21 DIAGNOSIS — Z09 Encounter for follow-up examination after completed treatment for conditions other than malignant neoplasm: Secondary | ICD-10-CM

## 2015-12-21 NOTE — Progress Notes (Signed)
[address]       [place] [address]             [phone]      Eastin Korpela [facility] Medical Record [id number] Date of Birth: Mar 09, 1961  Referring: Lacretia Leigh, MD Primary Care: No primary care provider on file.  Chief Complaint:   POST OP FOLLOW UP 10/02/2015  OPERATIVE REPORT PREOPERATIVE DIAGNOSIS: Left pleural effusion. POSTOPERATIVE DIAGNOSIS: Left chronic empyema. PROCEDURE PERFORMED: Video bronchoscopy, left video-assisted thoracoscopy, mini thoracotomy, drainage of empyema with decortication. SURGEON: Lanelle Bal, MD  History of Present Illness:     Patient comes to office after missing previous appointments and going to ER instead. His chest tube was removed by the ER. He denies fever and chills, his PICC line has has been removed, is currently off antibiotics. 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 His chronic empyema which was surgically drained in 2003 and again 2016 could cause  shortness of breath. The possibility of pulmonary hypertension has never been evaluated.    Past Medical History  Diagnosis Date  . Kidney stones   . Hypertension      History  Smoking status  . Never Smoker   Smokeless tobacco  . Not on file    History  Alcohol Use  . Yes     No Known Allergies  No current outpatient prescriptions on file.   No current facility-administered medications for this visit.       Physical Exam: BP 140/87 mmHg  Pulse 92  Resp 20  Ht 5' (1.524 m)  Wt 92 lb (41.731 kg)  BMI 17.97 kg/m2  SpO2 90%  General appearance: alert, cooperative and appears stated age Neurologic: intact Heart: regular rate and rhythm, S1, S2 normal, no murmur, click, rub or gallop Lungs: diminished breath sounds LLL Abdomen: soft,  non-tender; bowel sounds normal; no masses,  no organomegaly Extremities: extremities normal, atraumatic, no cyanosis or edema Wound: wounds well healed Patient's PICC line has been removed  Diagnostic Studies & Laboratory data:     Recent Radiology Findings:  Dg Chest 2 View  11/23/2015  CLINICAL DATA:  Empyema, pleural effusion, shortness of breath, history of VATS November 2016 EXAM: CHEST  2 VIEW COMPARISON:  11/08/2015 FINDINGS: Trace right pleural effusion. Loculated small left pleural effusion. Patchy left lower lobe airspace disease likely reflecting atelectasis. Calcified right pleural plaque. There is no pneumothorax. There is enlargement of the right pulmonary vasculature. Stable cardiomediastinal silhouette. There is no acute osseous abnormality. IMPRESSION: 1. Mildly loculated left pleural effusion. 2. Trace right pleural effusion. Electronically Signed   By: Kathreen Devoid   On: 11/23/2015 15:55   Ct Chest Wo Contrast  12/14/2015  CLINICAL DATA:  55 year old male with history of empyema. Clinical suspicion for recurrent empyema. History of prior thoracoscopic decortication and drainage. Cough, shortness of breath and occasional hemoptysis. EXAM: CT CHEST WITHOUT CONTRAST TECHNIQUE: Multidetector CT imaging of the chest was performed following the standard protocol without IV contrast. COMPARISON:  Chest CT 10/11/2015. FINDINGS: Mediastinum/Lymph Nodes: Heart size is normal. There is no significant pericardial fluid, thickening or pericardial calcification. Severe dilatation of the pulmonic trunk (4.7 cm in diameter) and the main pulmonary arteries, strongly suggestive of pulmonary arterial hypertension. No pathologically enlarged mediastinal or hilar lymph nodes. Please note  that accurate exclusion of hilar adenopathy is limited on noncontrast CT scans. Esophagus is unremarkable in appearance. No axillary lymphadenopathy. Lungs/Pleura: Calcified pleural plaques are noted bilaterally, which  could be related to asbestos related pleural disease, or could be related to prior decortication and talc pleurodesis. In the left hemithorax there is again a thick walled chronic pleural fluid collection. This collection is heterogeneous in attenuation, and there are multiple internal locules of gas, the overall appearance of which is concerning for recurrent empyema. Large collections of gas are noted on image 41 of series 4, and on image 21 of series 4 nondependently within this collection. 7 mm ground-glass attenuation nodule in the inferior aspect of the right upper lobe abutting the minor fissure is unchanged (image 36 of series 4). No other larger more suspicious appearing pulmonary nodules or masses are noted. Areas of chronic atelectasis and scarring are noted in the left lung adjacent to this chronic left pleural fluid collection, as well as in the basal segments of the right lower lobe. Extensive bronchial wall thickening with patchy areas of mild cylindrical bronchiectasis. Upper Abdomen: Incompletely visualized 12 mm low-attenuation lesion in the upper pole of the left kidney is not characterized on today's noncontrast CT examination, but is likely to represent a cyst. Musculoskeletal/Soft Tissues: There are no aggressive appearing lytic or blastic lesions noted in the visualized portions of the skeleton. IMPRESSION: 1. Chronic left-sided pleural collection is very complicated appearance and does have imaging features that are compatible with recurrent empyema. 2. Severe dilatation of the pulmonic trunk (4.7 cm in diameter) and main pulmonary arteries bilaterally, suggestive of pulmonary arterial hypertension. 3. Additional findings, similar prior studies, as above. Electronically Signed   By: Vinnie Langton M.D.   On: 12/14/2015 15:09     Recent Lab Findings: Lab Results  Component Value Date   WBC 10.1 10/16/2015   HGB 11.0* 10/16/2015   HCT 33.2* 10/16/2015   PLT 406* 10/16/2015   GLUCOSE  101* 10/20/2015   ALT 53 10/16/2015   AST 46* 10/16/2015   NA 134* 10/20/2015   K 4.2 10/20/2015   CL 97* 10/20/2015   CREATININE 1.48* 10/20/2015   BUN 7 10/20/2015   CO2 30 10/20/2015   INR 1.18 10/16/2015      Assessment / Plan:   Patient continues to have episodic shortness of breath and fatigue, he denies any fever or chills. He has had some intermittent hemoptysis With his persistent hypertension on each visit here we referred him to the [facility] for primary care and treatment of hypertension- so far he has not been able to give get an appointment With review of the follow-up CT scan I recommended to him that we proceed with CT-guided placement of pleural drainage tube on the left, he may ultimately require an Eloesser flap. In addition because of his shortness of breath and significant dilatation of his pulmonary artery suggesting pulmonary hypertension have referred him to the heart failure clinic for further cardiac evaluation. I plan to see him back in 2-3 weeks    [name] MD      [address] [place] [address] Office [phone]   Beeper [phone]  12/21/2015 12:54 PM

## 2015-12-25 ENCOUNTER — Ambulatory Visit (HOSPITAL_COMMUNITY): Admission: RE | Admit: 2015-12-25 | Payer: BLUE CROSS/BLUE SHIELD | Source: Ambulatory Visit

## 2015-12-25 ENCOUNTER — Other Ambulatory Visit: Payer: Self-pay | Admitting: Radiology

## 2015-12-25 ENCOUNTER — Telehealth (HOSPITAL_COMMUNITY): Payer: Self-pay | Admitting: *Deleted

## 2015-12-25 NOTE — Telephone Encounter (Signed)
Attempted to call patient.  He has not shown up for scheduled preocedure, tried to call so we could reschedule.  No answer

## 2015-12-28 ENCOUNTER — Other Ambulatory Visit: Payer: Self-pay | Admitting: Radiology

## 2016-01-11 ENCOUNTER — Ambulatory Visit (INDEPENDENT_AMBULATORY_CARE_PROVIDER_SITE_OTHER): Payer: BLUE CROSS/BLUE SHIELD | Admitting: Cardiothoracic Surgery

## 2016-01-11 ENCOUNTER — Inpatient Hospital Stay (HOSPITAL_COMMUNITY): Payer: BLUE CROSS/BLUE SHIELD

## 2016-01-11 ENCOUNTER — Encounter: Payer: Self-pay | Admitting: Cardiothoracic Surgery

## 2016-01-11 ENCOUNTER — Inpatient Hospital Stay (HOSPITAL_COMMUNITY)
Admission: AD | Admit: 2016-01-11 | Discharge: 2016-01-23 | DRG: 871 | Disposition: A | Discharge status: Home Health | Payer: BLUE CROSS/BLUE SHIELD | Source: Ambulatory Visit | Attending: Internal Medicine | Admitting: Internal Medicine

## 2016-01-11 VITALS — BP 161/97 | HR 101 | Temp 99.9°F | Resp 18 | Ht 60.0 in | Wt 91.6 lb

## 2016-01-11 DIAGNOSIS — Z681 Body mass index (BMI) 19 or less, adult: Secondary | ICD-10-CM | POA: Diagnosis not present

## 2016-01-11 DIAGNOSIS — I2781 Cor pulmonale (chronic): Secondary | ICD-10-CM | POA: Diagnosis present

## 2016-01-11 DIAGNOSIS — Z09 Encounter for follow-up examination after completed treatment for conditions other than malignant neoplasm: Secondary | ICD-10-CM | POA: Diagnosis not present

## 2016-01-11 DIAGNOSIS — I272 Other secondary pulmonary hypertension: Secondary | ICD-10-CM | POA: Diagnosis present

## 2016-01-11 DIAGNOSIS — R0902 Hypoxemia: Secondary | ICD-10-CM | POA: Diagnosis present

## 2016-01-11 DIAGNOSIS — J948 Other specified pleural conditions: Secondary | ICD-10-CM | POA: Diagnosis not present

## 2016-01-11 DIAGNOSIS — I10 Essential (primary) hypertension: Secondary | ICD-10-CM | POA: Diagnosis present

## 2016-01-11 DIAGNOSIS — B9562 Methicillin resistant Staphylococcus aureus infection as the cause of diseases classified elsewhere: Secondary | ICD-10-CM | POA: Diagnosis present

## 2016-01-11 DIAGNOSIS — I340 Nonrheumatic mitral (valve) insufficiency: Secondary | ICD-10-CM | POA: Diagnosis not present

## 2016-01-11 DIAGNOSIS — R609 Edema, unspecified: Secondary | ICD-10-CM

## 2016-01-11 DIAGNOSIS — R079 Chest pain, unspecified: Secondary | ICD-10-CM | POA: Diagnosis present

## 2016-01-11 DIAGNOSIS — R6510 Systemic inflammatory response syndrome (SIRS) of non-infectious origin without acute organ dysfunction: Secondary | ICD-10-CM | POA: Diagnosis present

## 2016-01-11 DIAGNOSIS — A419 Sepsis, unspecified organism: Secondary | ICD-10-CM | POA: Diagnosis present

## 2016-01-11 DIAGNOSIS — J939 Pneumothorax, unspecified: Secondary | ICD-10-CM

## 2016-01-11 DIAGNOSIS — E875 Hyperkalemia: Secondary | ICD-10-CM | POA: Diagnosis present

## 2016-01-11 DIAGNOSIS — R509 Fever, unspecified: Secondary | ICD-10-CM | POA: Diagnosis not present

## 2016-01-11 DIAGNOSIS — R0602 Shortness of breath: Secondary | ICD-10-CM

## 2016-01-11 DIAGNOSIS — J90 Pleural effusion, not elsewhere classified: Secondary | ICD-10-CM | POA: Diagnosis not present

## 2016-01-11 DIAGNOSIS — J869 Pyothorax without fistula: Secondary | ICD-10-CM

## 2016-01-11 DIAGNOSIS — B957 Other staphylococcus as the cause of diseases classified elsewhere: Secondary | ICD-10-CM | POA: Diagnosis not present

## 2016-01-11 DIAGNOSIS — Z4682 Encounter for fitting and adjustment of non-vascular catheter: Secondary | ICD-10-CM

## 2016-01-11 LAB — CBC WITH DIFFERENTIAL/PLATELET
BASOS ABS: 0 10*3/uL (ref 0.0–0.1)
BASOS PCT: 0 %
EOS PCT: 1 %
Eosinophils Absolute: 0.1 10*3/uL (ref 0.0–0.7)
HCT: 40.2 % (ref 39.0–52.0)
Hemoglobin: 12.8 g/dL — ABNORMAL LOW (ref 13.0–17.0)
Lymphocytes Relative: 12 %
Lymphs Abs: 0.8 10*3/uL (ref 0.7–4.0)
MCH: 27.1 pg (ref 26.0–34.0)
MCHC: 31.8 g/dL (ref 30.0–36.0)
MCV: 85.2 fL (ref 78.0–100.0)
MONO ABS: 0.7 10*3/uL (ref 0.1–1.0)
MONOS PCT: 10 %
Neutro Abs: 5.3 10*3/uL (ref 1.7–7.7)
Neutrophils Relative %: 77 %
PLATELETS: 308 10*3/uL (ref 150–400)
RBC: 4.72 MIL/uL (ref 4.22–5.81)
RDW: 15.1 % (ref 11.5–15.5)
WBC: 6.9 10*3/uL (ref 4.0–10.5)

## 2016-01-11 LAB — COMPREHENSIVE METABOLIC PANEL
ALBUMIN: 2.9 g/dL — AB (ref 3.5–5.0)
ALT: 18 U/L (ref 17–63)
ANION GAP: 6 (ref 5–15)
AST: 32 U/L (ref 15–41)
Alkaline Phosphatase: 109 U/L (ref 38–126)
BILIRUBIN TOTAL: 0.4 mg/dL (ref 0.3–1.2)
CHLORIDE: 96 mmol/L — AB (ref 101–111)
CO2: 33 mmol/L — ABNORMAL HIGH (ref 22–32)
Calcium: 8.5 mg/dL — ABNORMAL LOW (ref 8.9–10.3)
Creatinine, Ser: 1.08 mg/dL (ref 0.61–1.24)
GFR calc Af Amer: >60 mL/min (ref >60)
GLUCOSE: 106 mg/dL — AB (ref 65–99)
POTASSIUM: 4.3 mmol/L (ref 3.5–5.1)
Sodium: 135 mmol/L (ref 135–145)
TOTAL PROTEIN: 8.8 g/dL — AB (ref 6.5–8.1)

## 2016-01-11 LAB — PROTIME-INR
INR: 1.12 (ref 0.00–1.49)
PROTHROMBIN TIME: 14.6 s (ref 11.6–15.2)

## 2016-01-11 LAB — APTT: APTT: 32 s (ref 24–37)

## 2016-01-11 LAB — PHOSPHORUS: Phosphorus: 3.1 mg/dL (ref 2.5–4.6)

## 2016-01-11 LAB — LACTIC ACID, PLASMA: Lactic Acid, Venous: 0.7 mmol/L (ref 0.5–2.0)

## 2016-01-11 LAB — PROCALCITONIN: Procalcitonin: 0.1 ng/mL

## 2016-01-11 LAB — MAGNESIUM: MAGNESIUM: 2 mg/dL (ref 1.7–2.4)

## 2016-01-11 MED ORDER — PIPERACILLIN-TAZOBACTAM 3.375 G IVPB 30 MIN
3.3750 g | Freq: Once | INTRAVENOUS | Status: AC
  Administered 2016-01-12: 3.375 g via INTRAVENOUS
  Filled 2016-01-11: qty 50

## 2016-01-11 MED ORDER — SODIUM CHLORIDE 0.9 % IV SOLN
INTRAVENOUS | Status: AC
  Administered 2016-01-12: 03:00:00 via INTRAVENOUS

## 2016-01-11 MED ORDER — BENZONATATE 100 MG PO CAPS
200.0000 mg | ORAL_CAPSULE | Freq: Three times a day (TID) | ORAL | Status: DC | PRN
  Administered 2016-01-11 – 2016-01-22 (×15): 200 mg via ORAL
  Filled 2016-01-11 (×15): qty 2

## 2016-01-11 MED ORDER — PIPERACILLIN-TAZOBACTAM 3.375 G IVPB
3.3750 g | Freq: Three times a day (TID) | INTRAVENOUS | Status: DC
  Administered 2016-01-12 – 2016-01-15 (×10): 3.375 g via INTRAVENOUS
  Filled 2016-01-11 (×12): qty 50

## 2016-01-11 MED ORDER — ACETAMINOPHEN 325 MG PO TABS
650.0000 mg | ORAL_TABLET | Freq: Four times a day (QID) | ORAL | Status: DC | PRN
  Administered 2016-01-12 – 2016-01-13 (×3): 650 mg via ORAL
  Filled 2016-01-11 (×3): qty 2

## 2016-01-11 MED ORDER — VANCOMYCIN HCL IN DEXTROSE 750-5 MG/150ML-% IV SOLN
750.0000 mg | Freq: Once | INTRAVENOUS | Status: DC
  Filled 2016-01-11: qty 150

## 2016-01-11 MED ORDER — ONDANSETRON HCL 4 MG/2ML IJ SOLN: 4.0000 mg | Freq: Four times a day (QID) | INTRAMUSCULAR | Status: DC | PRN

## 2016-01-11 MED ORDER — KETOROLAC TROMETHAMINE 15 MG/ML IJ SOLN
15.0000 mg | Freq: Once | INTRAMUSCULAR | Status: AC
  Administered 2016-01-11: 15 mg via INTRAVENOUS
  Filled 2016-01-11: qty 1

## 2016-01-11 MED ORDER — PANTOPRAZOLE SODIUM 40 MG PO TBEC
40.0000 mg | DELAYED_RELEASE_TABLET | Freq: Every day | ORAL | Status: DC
  Administered 2016-01-11 – 2016-01-23 (×13): 40 mg via ORAL
  Filled 2016-01-11 (×13): qty 1

## 2016-01-11 MED ORDER — SODIUM CHLORIDE 0.9% FLUSH
3.0000 mL | Freq: Two times a day (BID) | INTRAVENOUS | Status: DC
  Administered 2016-01-14 – 2016-01-17 (×4): 3 mL via INTRAVENOUS

## 2016-01-11 MED ORDER — SODIUM CHLORIDE 0.9 % IV BOLUS (SEPSIS)
1000.0000 mL | Freq: Once | INTRAVENOUS | Status: AC
  Administered 2016-01-11: 1000 mL via INTRAVENOUS

## 2016-01-11 MED ORDER — ENOXAPARIN SODIUM 30 MG/0.3ML ~~LOC~~ SOLN
20.0000 mg | SUBCUTANEOUS | Status: DC
  Administered 2016-01-11 – 2016-01-12 (×2): 30 mg via SUBCUTANEOUS
  Administered 2016-01-13 – 2016-01-17 (×5): 20 mg via SUBCUTANEOUS
  Filled 2016-01-11: qty 0.3
  Filled 2016-01-11: qty 0.2
  Filled 2016-01-11: qty 0.3
  Filled 2016-01-11 (×4): qty 0.2
  Filled 2016-01-11 (×4): qty 0.3

## 2016-01-11 MED ORDER — ONDANSETRON HCL 4 MG PO TABS: 4.0000 mg | ORAL_TABLET | Freq: Four times a day (QID) | ORAL | Status: DC | PRN

## 2016-01-11 MED ORDER — VANCOMYCIN HCL IN DEXTROSE 1-5 GM/200ML-% IV SOLN
1000.0000 mg | INTRAVENOUS | Status: DC
  Filled 2016-01-11: qty 200

## 2016-01-11 MED ORDER — VANCOMYCIN HCL IN DEXTROSE 1-5 GM/200ML-% IV SOLN: 1000.0000 mg | Freq: Once | INTRAVENOUS | Status: DC

## 2016-01-11 MED ORDER — HYDROCODONE-ACETAMINOPHEN 5-325 MG PO TABS
1.0000 | ORAL_TABLET | Freq: Four times a day (QID) | ORAL | Status: DC | PRN
  Administered 2016-01-11 – 2016-01-22 (×20): 1 via ORAL
  Filled 2016-01-11 (×21): qty 1

## 2016-01-11 MED ORDER — ACETAMINOPHEN 650 MG RE SUPP: 650.0000 mg | Freq: Four times a day (QID) | RECTAL | Status: DC | PRN

## 2016-01-11 MED ORDER — VANCOMYCIN HCL IN DEXTROSE 1-5 GM/200ML-% IV SOLN
1000.0000 mg | INTRAVENOUS | Status: AC
  Administered 2016-01-12: 1000 mg via INTRAVENOUS
  Filled 2016-01-11: qty 200

## 2016-01-11 NOTE — Progress Notes (Signed)
[address]       [place] [address]             [phone]      Sargon Gargus [place] [facility] Medical Record [id number] Date of Birth: 12/19/1960  Referring: Lacretia Leigh, MD Primary Care: No primary care provider on file.  Chief Complaint:   POST OP FOLLOW UP 10/02/2015 OPERATIVE REPORT PREOPERATIVE DIAGNOSIS: Left pleural effusion. POSTOPERATIVE DIAGNOSIS: Left chronic empyema. PROCEDURE PERFORMED: Video bronchoscopy, left video-assisted thoracoscopy, mini thoracotomy, drainage of empyema with decortication. SURGEON: Lanelle Bal, MD  History of Present Illness:     Patient comes to office after missing previous appointments and going to ER instead. His chest tube was removed by the ER. He denies fever and chills, his PICC line has has been removed, is currently off antibiotics. 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 His chronic empyema which was surgically drained in 2003 and again 2016 could cause  shortness of breath. The possibility of pulmonary hypertension has never been evaluated. Patient had been referred to our 2 place an additional CT directed chest tube, but the patient missed the appointment. He was also referred to the heart failure clinic for evaluation of pulmonary hypertension, but this evaluation was never done  The patient returns to the office today complaining of increasing shortness of breath. O2 sats are 83%   Past Medical History  Diagnosis Date  . Kidney stones   . Hypertension      History  Smoking status  . Never Smoker   Smokeless tobacco  . Not on file    History  Alcohol Use  . Yes     No Known Allergies  No current outpatient prescriptions on file.   No current facility-administered medications for this visit.        Physical Exam: BP 161/97 mmHg  Pulse 101  Temp(Src) 99.9 F (37.7 C) (Oral)  Resp 18  Ht 5' (1.524 m)  Wt 91 lb 9.6 oz (41.549 kg)  BMI 17.89 kg/m2  SpO2 83%  General appearance: alert, cooperative and appears stated age Neurologic: intact Heart: regular rate and rhythm, S1, S2 normal, no murmur, click, rub or gallop Lungs: diminished breath sounds LLL Abdomen: soft, non-tender; bowel sounds normal; no masses,  no organomegaly Extremities: extremities normal, atraumatic, no cyanosis or edema Wound: wounds well healed Patient's PICC line has been removed  Diagnostic Studies & Laboratory data:     Recent Radiology Findings:   Dg Chest 2 View  11/23/2015  CLINICAL DATA:  Empyema, pleural effusion, shortness of breath, history of VATS November 2016 EXAM: CHEST  2 VIEW COMPARISON:  11/08/2015 FINDINGS: Trace right pleural effusion. Loculated small left pleural effusion. Patchy left lower lobe airspace disease likely reflecting atelectasis. Calcified right pleural plaque. There is no pneumothorax. There is enlargement of the right pulmonary vasculature. Stable cardiomediastinal silhouette. There is no acute osseous abnormality. IMPRESSION: 1. Mildly loculated left pleural effusion. 2. Trace right pleural effusion. Electronically Signed   By: Kathreen Devoid   On: 11/23/2015 15:55   Ct Chest Wo Contrast  12/14/2015  CLINICAL DATA:  55 year old male with history of empyema. Clinical suspicion for recurrent empyema. History of prior thoracoscopic decortication and drainage. Cough, shortness of breath and occasional hemoptysis. EXAM: CT CHEST WITHOUT CONTRAST TECHNIQUE: Multidetector CT  imaging of the chest was performed following the standard protocol without IV contrast. COMPARISON:  Chest CT 10/11/2015. FINDINGS: Mediastinum/Lymph Nodes: Heart size is normal. There is no significant pericardial fluid, thickening or pericardial calcification. Severe dilatation of the pulmonic trunk (4.7  cm in diameter) and the main pulmonary arteries, strongly suggestive of pulmonary arterial hypertension. No pathologically enlarged mediastinal or hilar lymph nodes. Please note that accurate exclusion of hilar adenopathy is limited on noncontrast CT scans. Esophagus is unremarkable in appearance. No axillary lymphadenopathy. 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 In the left hemithorax there is again a thick walled chronic pleural fluid collection. This collection is heterogeneous in attenuation, and there are multiple internal locules of gas, the overall appearance of which is concerning for recurrent empyema. Large collections of gas are noted on image 41 of series 4, and on image 21 of series 4 nondependently within this collection. 7 mm ground-glass attenuation nodule in the inferior aspect of the right upper lobe abutting the minor fissure is unchanged (image 36 of series 4). No other larger more suspicious appearing pulmonary nodules or masses are noted. Areas of chronic atelectasis and scarring are noted in the left lung adjacent to this chronic left pleural fluid collection, as well as in the basal segments of the right lower lobe. Extensive bronchial wall thickening with patchy areas of mild cylindrical bronchiectasis. Upper Abdomen: Incompletely visualized 12 mm low-attenuation lesion in the upper pole of the left kidney is not characterized on today's noncontrast CT examination, but is likely to represent a cyst. Musculoskeletal/Soft Tissues: There are no aggressive appearing lytic or blastic lesions noted in the visualized portions of the skeleton. IMPRESSION: 1. Chronic left-sided pleural collection is very complicated appearance and does have imaging features that are compatible with recurrent empyema. 2. Severe dilatation of the pulmonic trunk (4.7 cm in  diameter) and main pulmonary arteries bilaterally, suggestive of pulmonary arterial hypertension. 3. Additional findings, similar prior studies, as above. Electronically Signed   By: Vinnie Langton M.D.   On: 12/14/2015 15:09     Recent Lab Findings: Lab Results  Component Value Date   WBC 10.1 10/16/2015   HGB 11.0* 10/16/2015   HCT 33.2* 10/16/2015   PLT 406* 10/16/2015   GLUCOSE 101* 10/20/2015   ALT 53 10/16/2015   AST 46* 10/16/2015   NA 134* 10/20/2015   K 4.2 10/20/2015   CL 97* 10/20/2015   CREATININE 1.48* 10/20/2015   BUN 7 10/20/2015   CO2 30 10/20/2015   INR 1.18 10/16/2015      Assessment / Plan:   Patient continues to have episodic shortness of breath and fatigue, he denies any fever or chills. He has had some intermittent hemoptysis With his persistent hypertension on each visit here we referred him to the [facility] for primary care and treatment of hypertension- so far he has not been To get an appointment  With review of the follow-up CT scan I recommended to him that we proceed with CT-guided placement of pleural drainage tube on the left, but he missed his interventional radiology appointment In addition because of his shortness of breath and significant dilatation of his pulmonary artery suggesting pulmonary hypertension have referred him to the heart failure clinic for further cardiac evaluation, he was referred to the heart failure clinic on his last office visit but also was never seen .  With the patient now on the office with O2 sat of 83, complaining of shortness of breath with exertion, with barriers to being fully evaluated both by [facility] for his severe hypertension, heart failure clinic for his pulmonary hypertension, or to follow-up with his left empyema drainage will have him admitted today.    [name] MD      [address] [place] [address] Office [phone]   Beeper  [phone]  01/11/2016 5:41 PM

## 2016-01-11 NOTE — Progress Notes (Signed)
Patient sitting on side of the bed. Is experiencing pain. Physician paged to come visit and place orders. Patient put on 2L oxygen upon arrival. Call light within reach.

## 2016-01-11 NOTE — Progress Notes (Signed)
Pharmacy Antibiotic Note  [name] [name] is a 55 y.o. male who was a direct admit from the CVTS office on 01/11/2016 after presenting with SOB and need for a CT placement.. The patient has a history of a chronic empyema that was surgically drained in '03 and again in '16. He has failed to make follow-up appointments to follow-up on pulmonary hyperension or L-empyema drainage so the patient was admitted for treatment. Pharmacy has been consulted for to start Vancomycin + Zosyn for r/o sepsis in the setting of a recurrent empyema. Temp 102.6, WBC wnl, SCr 1.08, CrCl~45-50 ml/min.   Of noting, in December '16 - the patient was treated with Vancomycin + Zosyn inpatient and then discharged out to complete 3 weeks of IV Zosyn per ID recommendations. Treatment was completed on 11/07/15.  Plan: 1. Vancomycin 1g IV x 1 followed by Vancomycin 1g IV every 24 hours 2. Zosyn 3.375g IV x 1 dose now (infused over 30 minutes) followed by Zosyn 3.375g every 8 hours (infused over 4 hours) 3. Will continue to follow renal function, culture results, LOT, and antibiotic de-escalation plans      Temp (24hrs), Avg:100.4 F (38 C), Min:98.8 F (37.1 C), Max:102.6 F (39.2 C)   Recent Labs Lab 01/11/16 2135  WBC 6.9  CREATININE 1.08  LATICACIDVEN 0.7    Estimated Creatinine Clearance: 46.4 mL/min (by C-G formula based on Cr of 1.08).    No Known Allergies  Antimicrobials this admission: Vanc 3/9 >> Zosyn 3/9 >>  Dose adjustments this admission: n/a  Microbiology results: 3/9 BCx >>  Thank you for allowing pharmacy to be a part of this patient's care.  Alycia Rossetti, PharmD, BCPS Clinical Pharmacist Pager: [phone] 01/11/2016 10:50 PM

## 2016-01-11 NOTE — H&P (Signed)
[facility] History and Physical  Ben Devany [id number] DOB: April 08, 1961 DOA: 01/11/2016  Referring physician: [name], MD PCP: No primary care provider on file.   Chief Complaint: Fever and SOB.  HPI: [name] [name] is a 55 y.o. male with a past medical history of urolithiasis, hypertension, chronic empyema who was referred by Dr. Lanelle Bal from the cardiac and thoracic surgery clinic for recurrent empyema.  The patient was admitted in November for empyema, underwent a VATS procedure, chest tube placement and PICC line placement for outpatient IV antibiotic therapy. However he missed his previous appointment with cardiothoracic surgery and other providers, instead he kept going to the emergency department for care. His chest tube was eventually removed by the emergency department.  Today, the patient went to see Dr. Servando Snare to the clinic due to persistent episodes of shortness of breath, fatigue and malaise. He denies having fever or chills when seen at his office, but he was febrile on arrival to the hospital.   When seen, the patient was feeling better after Tylenol and Toradol were given for pain and fever. He was in no acute distress.  Review of Systems:  Constitutional:  Positive Fevers, chills, fatigue.  No weight loss, night sweats HEENT:  No headaches, Difficulty swallowing,Tooth/dental problems,Sore throat,  No sneezing, itching, ear ache, nasal congestion, post nasal drip,  Cardio-vascular:  No chest pain, Orthopnea, PND, swelling in lower extremities, anasarca, dizziness, palpitations  GI: Positive loss of appetite. No heartburn, indigestion, abdominal pain, nausea, vomiting, diarrhea, change in bowel habits Resp:  Positive dyspnea, pleuritic chest pain from coughing, positive productive cough.  No wheezing, no hemoptysis. Skin:  no rash or lesions.  GU:  No dysuria, change in color of urine, no urgency or frequency. No flank pain.  Musculoskeletal:   No joint pain or swelling. No decreased range of motion. No back pain.  Psych:  No change in mood or affect. No depression or anxiety. No memory loss.   Past Medical History  Diagnosis Date  . Kidney stones   . Hypertension    Past Surgical History  Procedure Laterality Date  . Kidney surgery    . Video assisted thoracoscopy (vats)/decortication Left 2003  . Video assisted thoracoscopy (vats)/decortication Left 10/02/2015    Procedure: VIDEO ASSISTED THORACOSCOPY (VATS)/DECORTICATION and drainage of chronic empyena;  Surgeon: [name], MD;  Location: [facility];  Service: Thoracic;  Laterality: Left;  . Video bronchoscopy N/A 10/02/2015    Procedure: VIDEO BRONCHOSCOPY;  Surgeon: [name], MD;  Location: [facility];  Service: Thoracic;  Laterality: N/A;   Social History:  reports that he has never smoked. He does not have any smokeless tobacco history on file. He reports that he drinks alcohol. He reports that he does not use illicit drugs.  No Known Allergies  No family history on file.   Prior to Admission medications   Not on File   Physical Exam: Filed Vitals:   01/11/16 1839 01/11/16 2042  BP: 148/88 148/79  Pulse: 100 100  Temp: 98.8 F (37.1 C) 102.6 F (39.2 C)  TempSrc: Oral Oral  Resp: 20 20  SpO2: 86% 98%    Wt Readings from Last 3 Encounters:  01/11/16 41.549 kg (91 lb 9.6 oz)  12/21/15 41.731 kg (92 lb)  11/23/15 40.824 kg (90 lb)    General:  Appears calm and comfortable.                 Looks frail, cachectic and  chronically ill. Eyes: PERRL, normal lids, irises & conjunctiva ENT: grossly normal hearing, lips & tongue Neck: no LAD, masses or thyromegaly Cardiovascular: RRR, no m/r/g. No LE edema. Telemetry: SR, no arrhythmias  Respiratory: Decreased breath sounds on left middle and lower lung fields, mild rhonchi bilaterally, no wheezing. Abdomen: soft, ntnd Skin: no rash or induration seen on limited exam Musculoskeletal: grossly  normal tone BUE/BLE Psychiatric: grossly normal mood and affect, speech fluent and appropriate Neurologic: Awake, alert, oriented 3, grossly non-focal.          Labs on Admission:  Basic Metabolic Panel:  Recent Labs Lab 01/11/16 2135  NA 135  K 4.3  CL 96*  CO2 33*  GLUCOSE 106*  BUN <5*  CREATININE 1.08  CALCIUM 8.5*  MG 2.0  PHOS 3.1   Liver Function Tests:  Recent Labs Lab 01/11/16 2135  AST 32  ALT 18  ALKPHOS 109  BILITOT 0.4  PROT 8.8*  ALBUMIN 2.9*   CBC:  Recent Labs Lab 01/11/16 2135  WBC 6.9  NEUTROABS 5.3  HGB 12.8*  HCT 40.2  MCV 85.2  PLT 308    Radiological Exams on Admission: Dg Chest Port 1 View  01/11/2016  CLINICAL DATA:  Sepsis EXAM: PORTABLE CHEST 1 VIEW COMPARISON:  CT chest 12/14/2015.  Chest x-ray 11/23/2015 FINDINGS: Left pleural loculated fluid and pleural scarring unchanged from the prior study. Prior CT demonstrated air-fluid level consistent with hydro pneumothorax. This is difficult to see on the chest x-ray but probably remains. 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 Underlying chronic lung disease with COPD Marked pulmonary artery enlargement with pulmonary artery hypertension again noted. IMPRESSION: Loculated left pleural effusion and pleural scarring unchanged with probable hydro pneumothorax on the left as seen on the prior CT of 12/14/2015. Severe pulmonary artery hypertension. No new findings compared with earlier studies. Electronically Signed   By: Franchot Gallo M.D.   On: 01/11/2016 21:18  Echocardiogram: 09/27/2015  ------------------------------------------------------------------- LV EF: 55% - 60%  ------------------------------------------------------------------- Indications: Dyspnea 786.09.  ------------------------------------------------------------------- History: PMH: Abdominal cramps. Left pleural effusion. Dilation of main pulmonary artery. Chest  pain.  ------------------------------------------------------------------- Study Conclusions  - Left ventricle: The cavity size was normal. Wall thickness was  normal. Systolic function was normal. The estimated ejection  fraction was in the range of 55% to 60%. - Right ventricle: The cavity size was mildly dilated. - Right atrium: The atrium was mildly dilated. - Atrial septum: No defect or patent foramen ovale was identified. - Pulmonary arteries: PA peak pressure: 33 mm Hg (S).  Assessment/Plan Principal Problem:   SIRS (systemic inflammatory response syndrome) (HCC) Active problems:   Pleural effusion, left   Chest pain   Empyema, left (HCC) Admit to telemetry/Inpatient. Start supplemental oxygen. Bronchodilators as needed. Basic labs were ordered Blood cultures 2. Start broad-spectrum antibiotics. Cardiothoracic surgery will evaluate in the morning.       Code Status: Full code. DVT Prophylaxis: Lovenox SQ.  Family Communication:  Disposition Plan: Admit for IV antibiotic therapy and cardiothoracic surgery follow-up in a.m.  Time spent: Over 70 minutes were spent during the process of this admission.  Reubin Milan, M.D. [facility] Pager [phone].

## 2016-01-12 ENCOUNTER — Inpatient Hospital Stay (HOSPITAL_COMMUNITY): Payer: BLUE CROSS/BLUE SHIELD

## 2016-01-12 ENCOUNTER — Ambulatory Visit (HOSPITAL_COMMUNITY): Payer: BLUE CROSS/BLUE SHIELD

## 2016-01-12 DIAGNOSIS — R6510 Systemic inflammatory response syndrome (SIRS) of non-infectious origin without acute organ dysfunction: Secondary | ICD-10-CM

## 2016-01-12 DIAGNOSIS — J869 Pyothorax without fistula: Secondary | ICD-10-CM

## 2016-01-12 DIAGNOSIS — J948 Other specified pleural conditions: Secondary | ICD-10-CM

## 2016-01-12 LAB — DIFFERENTIAL
BASOS ABS: 0 10*3/uL (ref 0.0–0.1)
BASOS PCT: 1 %
EOS ABS: 0.1 10*3/uL (ref 0.0–0.7)
Eosinophils Relative: 1 %
Lymphocytes Relative: 13 %
Lymphs Abs: 0.7 10*3/uL (ref 0.7–4.0)
MONOS PCT: 14 %
Monocytes Absolute: 0.8 10*3/uL (ref 0.1–1.0)
NEUTROS ABS: 4.1 10*3/uL (ref 1.7–7.7)
NEUTROS PCT: 71 %

## 2016-01-12 LAB — COMPREHENSIVE METABOLIC PANEL
ALBUMIN: 2.6 g/dL — AB (ref 3.5–5.0)
ALT: 17 U/L (ref 17–63)
ANION GAP: 11 (ref 5–15)
AST: 26 U/L (ref 15–41)
Alkaline Phosphatase: 97 U/L (ref 38–126)
BILIRUBIN TOTAL: 0.4 mg/dL (ref 0.3–1.2)
BUN: 6 mg/dL (ref 6–20)
CALCIUM: 8.2 mg/dL — AB (ref 8.9–10.3)
CO2: 29 mmol/L (ref 22–32)
Chloride: 97 mmol/L — ABNORMAL LOW (ref 101–111)
Creatinine, Ser: 1.06 mg/dL (ref 0.61–1.24)
Glucose, Bld: 106 mg/dL — ABNORMAL HIGH (ref 65–99)
POTASSIUM: 4.5 mmol/L (ref 3.5–5.1)
Sodium: 137 mmol/L (ref 135–145)
TOTAL PROTEIN: 7.7 g/dL (ref 6.5–8.1)

## 2016-01-12 LAB — CBC
HCT: 37.3 % — ABNORMAL LOW (ref 39.0–52.0)
HEMOGLOBIN: 11.4 g/dL — AB (ref 13.0–17.0)
MCH: 25.9 pg — ABNORMAL LOW (ref 26.0–34.0)
MCHC: 30.6 g/dL (ref 30.0–36.0)
MCV: 84.8 fL (ref 78.0–100.0)
PLATELETS: 323 10*3/uL (ref 150–400)
RBC: 4.4 MIL/uL (ref 4.22–5.81)
RDW: 14.8 % (ref 11.5–15.5)
WBC: 5.7 10*3/uL (ref 4.0–10.5)

## 2016-01-12 LAB — LACTATE DEHYDROGENASE, PLEURAL OR PERITONEAL FLUID: LD, Fluid: 9088 U/L — ABNORMAL HIGH (ref 3–23)

## 2016-01-12 LAB — CREATININE, SERUM
CREATININE: 1.11 mg/dL (ref 0.61–1.24)
GFR calc Af Amer: >60 mL/min (ref >60)

## 2016-01-12 LAB — BODY FLUID CELL COUNT WITH DIFFERENTIAL
Eos, Fluid: 0 %
Lymphs, Fluid: 2 %
Monocyte-Macrophage-Serous Fluid: 2 % — ABNORMAL LOW (ref 50–90)
Neutrophil Count, Fluid: 96 % — ABNORMAL HIGH (ref 0–25)
WBC FLUID: 94000 uL — AB (ref 0–1000)

## 2016-01-12 LAB — GRAM STAIN

## 2016-01-12 LAB — LACTIC ACID, PLASMA: LACTIC ACID, VENOUS: 0.6 mmol/L (ref 0.5–2.0)

## 2016-01-12 MED ORDER — MIDAZOLAM HCL 2 MG/2ML IJ SOLN
INTRAMUSCULAR | Status: AC
  Filled 2016-01-12: qty 2

## 2016-01-12 MED ORDER — LIDOCAINE HCL 1 % IJ SOLN
INTRAMUSCULAR | Status: AC
  Filled 2016-01-12: qty 20

## 2016-01-12 MED ORDER — FENTANYL CITRATE (PF) 100 MCG/2ML IJ SOLN
INTRAMUSCULAR | Status: AC
  Filled 2016-01-12: qty 2

## 2016-01-12 MED ORDER — MIDAZOLAM HCL 2 MG/2ML IJ SOLN
INTRAMUSCULAR | Status: AC | PRN
  Administered 2016-01-12 (×2): 0.5 mg via INTRAVENOUS

## 2016-01-12 MED ORDER — FENTANYL CITRATE (PF) 100 MCG/2ML IJ SOLN
INTRAMUSCULAR | Status: AC | PRN
  Administered 2016-01-12 (×3): 25 ug via INTRAVENOUS

## 2016-01-12 MED ORDER — IPRATROPIUM-ALBUTEROL 0.5-2.5 (3) MG/3ML IN SOLN: 3.0000 mL | Freq: Four times a day (QID) | RESPIRATORY_TRACT | Status: DC | PRN

## 2016-01-12 NOTE — Procedures (Signed)
Interventional Radiology Procedure Note  Procedure: Placement of a 105F chest tube modified with extra sideholes into the left pleural space.    Complications: None  Estimated Blood Loss: 0  Recommendations: - LWS at 20 cm H2O  Signed,  Criselda Peaches, MD

## 2016-01-12 NOTE — Progress Notes (Signed)
Utilization review completed. Lusine Corlett, RN, BSN. 

## 2016-01-12 NOTE — Progress Notes (Signed)
PATIENT DETAILS Name: [name] [name] Age: 55 y.o. Sex: male Date of Birth: 1960-12-23 Admit Date: 01/11/2016 Admitting Physician Geradine Girt, DO PCP:No primary care provider on file.  Subjective: Feels better, was febrile last night.  Assessment/Plan: Principal Problem: SIRS (systemic inflammatory response syndrome): Secondary to recurrent left-sided empyema. Blood cultures pending, continue empiric antibiotics.  Active Problems: Recurrent left-sided empyema: Seen by cardiothoracic surgery, plans are for CT-guided pigtail placement. Have ordered pleural fluid cell count and cultures. Continue empiric vancomycin and Zosyn for now.  Pulmonary hypertension: Obtain echocardiogram-follow.  Disposition: Remain inpatient  Antimicrobial agents  See below  Anti-infectives    Start     Dose/Rate Route Frequency Ordered Stop   01/12/16 2330  vancomycin (VANCOCIN) IVPB 1000 mg/200 mL premix     1,000 mg 200 mL/hr over 60 Minutes Intravenous Every 24 hours 01/11/16 2256     01/12/16 0600  piperacillin-tazobactam (ZOSYN) IVPB 3.375 g     3.375 g 12.5 mL/hr over 240 Minutes Intravenous 3 times per day 01/11/16 2256     01/11/16 2300  vancomycin (VANCOCIN) IVPB 1000 mg/200 mL premix     1,000 mg 200 mL/hr over 60 Minutes Intravenous STAT 01/11/16 2254 01/12/16 0330   01/11/16 2100  vancomycin (VANCOCIN) IVPB 750 mg/150 ml premix  Status:  Discontinued     750 mg 150 mL/hr over 60 Minutes Intravenous  Once 01/11/16 2055 01/11/16 2254   01/11/16 2045  piperacillin-tazobactam (ZOSYN) IVPB 3.375 g     3.375 g 100 mL/hr over 30 Minutes Intravenous  Once 01/11/16 2044 01/12/16 0406   01/11/16 2045  vancomycin (VANCOCIN) IVPB 1000 mg/200 mL premix  Status:  Discontinued     1,000 mg 200 mL/hr over 60 Minutes Intravenous  Once 01/11/16 2044 01/11/16 2055      DVT Prophylaxis: Prophylactic Lovenox   Code Status: Full code   Family Communication None at  bedside  Procedures: None  CONSULTS:  ID and Cardiothoracic surgery  Time spent 20 minutes-Greater than 50% of this time was spent in counseling, explanation of diagnosis, planning of further management, and coordination of care.  MEDICATIONS: Scheduled Meds: . enoxaparin (LOVENOX) injection  20 mg Subcutaneous Q24H  . fentaNYL      . lidocaine      . midazolam      . pantoprazole  40 mg Oral Daily  . piperacillin-tazobactam (ZOSYN)  IV  3.375 g Intravenous 3 times per day  . sodium chloride flush  3 mL Intravenous Q12H  . vancomycin  1,000 mg Intravenous Q24H   Continuous Infusions:  PRN Meds:.acetaminophen **OR** acetaminophen, benzonatate, HYDROcodone-acetaminophen, ipratropium-albuterol, ondansetron **OR** ondansetron (ZOFRAN) IV    PHYSICAL EXAM: Vital signs in last 24 hours: Filed Vitals:   01/11/16 1839 01/11/16 2042 01/12/16 0339  BP: 148/88 148/79 113/69  Pulse: 100 100 84  Temp: 98.8 F (37.1 C) 102.6 F (39.2 C) 98.6 F (37 C)  TempSrc: Oral Oral Oral  Resp: 20 20 18   SpO2: 86% 98% 96%    Weight change:  There were no vitals filed for this visit. There is no weight on file to calculate BMI.   Gen Exam: Awake and alert with clear speech.  Neck: Supple, No JVD.   Chest: B/L Clear.   CVS: S1 S2 Regular, no murmurs.  Abdomen: soft, BS +, non tender, non distended.  Extremities: no edema, lower extremities warm to touch. Neurologic: Non Focal.  Skin: No Rash.   Wounds: N/A.    Intake/Output from previous day: No intake or output data in the 24 hours ending 01/12/16 1248   LAB RESULTS: CBC  Recent Labs Lab 01/11/16 2135 01/12/16 0121  WBC 6.9 5.7  HGB 12.8* 11.4*  HCT 40.2 37.3*  PLT 308 323  MCV 85.2 84.8  MCH 27.1 25.9*  MCHC 31.8 30.6  RDW 15.1 14.8  LYMPHSABS 0.8 0.7  MONOABS 0.7 0.8  EOSABS 0.1 0.1  BASOSABS 0.0 0.0    Chemistries   Recent Labs Lab 01/11/16 2135 01/12/16 0121 01/12/16 0303  NA 135  --  137  K 4.3  --   4.5  CL 96*  --  97*  CO2 33*  --  29  GLUCOSE 106*  --  106*  BUN <5*  --  6  CREATININE 1.08 1.11 1.06  CALCIUM 8.5*  --  8.2*  MG 2.0  --   --     CBG: No results for input(s): GLUCAP in the last 168 hours.  GFR Estimated Creatinine Clearance: 47.3 mL/min (by C-G formula based on Cr of 1.06).  Coagulation profile  Recent Labs Lab 01/11/16 2135  INR 1.12    Cardiac Enzymes No results for input(s): CKMB, TROPONINI, MYOGLOBIN in the last 168 hours.  Invalid input(s): CK  Invalid input(s): POCBNP No results for input(s): DDIMER in the last 72 hours. No results for input(s): HGBA1C in the last 72 hours. No results for input(s): CHOL, HDL, LDLCALC, TRIG, CHOLHDL, LDLDIRECT in the last 72 hours. No results for input(s): TSH, T4TOTAL, T3FREE, THYROIDAB in the last 72 hours.  Invalid input(s): FREET3 No results for input(s): VITAMINB12, FOLATE, FERRITIN, TIBC, IRON, RETICCTPCT in the last 72 hours. No results for input(s): LIPASE, AMYLASE in the last 72 hours.  Urine Studies No results for input(s): UHGB, CRYS in the last 72 hours.  Invalid input(s): UACOL, UAPR, USPG, UPH, UTP, UGL, UKET, UBIL, UNIT, UROB, ULEU, UEPI, UWBC, URBC, UBAC, CAST, UCOM, BILUA  MICROBIOLOGY: Recent Results (from the past 240 hour(s))  Culture, blood (x 2)     Status: None (Preliminary result)   Collection Time: 01/11/16  9:40 PM  Result Value Ref Range Status   Specimen Description BLOOD RIGHT ANTECUBITAL  Final   Special Requests BOTTLES DRAWN AEROBIC AND ANAEROBIC 10CC EACH  Final   Culture NO GROWTH < 24 HOURS  Final   Report Status PENDING  Incomplete  Culture, blood (x 2)     Status: None (Preliminary result)   Collection Time: 01/11/16  9:40 PM  Result Value Ref Range Status   Specimen Description BLOOD LEFT ARM  Final   Special Requests BOTTLES DRAWN AEROBIC AND ANAEROBIC 5CC EACH  Final   Culture NO GROWTH < 24 HOURS  Final   Report Status PENDING  Incomplete    RADIOLOGY  STUDIES/RESULTS: Ct Chest Wo Contrast  12/14/2015  CLINICAL DATA:  55 year old male with history of empyema. Clinical suspicion for recurrent empyema. History of prior thoracoscopic decortication and drainage. Cough, shortness of breath and occasional hemoptysis. EXAM: CT CHEST WITHOUT CONTRAST TECHNIQUE: Multidetector CT imaging of the chest was performed following the standard protocol without IV contrast. COMPARISON:  Chest CT 10/11/2015. FINDINGS: Mediastinum/Lymph Nodes: Heart size is normal. There is no significant pericardial fluid, thickening or pericardial calcification. Severe dilatation of the pulmonic trunk (4.7 cm in diameter) and the main pulmonary arteries, strongly suggestive of pulmonary arterial hypertension. No pathologically enlarged mediastinal or hilar lymph nodes. Please  note that accurate exclusion of hilar adenopathy is limited on noncontrast CT scans. Esophagus is unremarkable in appearance. No axillary lymphadenopathy. 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 In the left hemithorax there is again a thick walled chronic pleural fluid collection. This collection is heterogeneous in attenuation, and there are multiple internal locules of gas, the overall appearance of which is concerning for recurrent empyema. Large collections of gas are noted on image 41 of series 4, and on image 21 of series 4 nondependently within this collection. 7 mm ground-glass attenuation nodule in the inferior aspect of the right upper lobe abutting the minor fissure is unchanged (image 36 of series 4). No other larger more suspicious appearing pulmonary nodules or masses are noted. Areas of chronic atelectasis and scarring are noted in the left lung adjacent to this chronic left pleural fluid collection, as well as in the basal segments of the right lower lobe.  Extensive bronchial wall thickening with patchy areas of mild cylindrical bronchiectasis. Upper Abdomen: Incompletely visualized 12 mm low-attenuation lesion in the upper pole of the left kidney is not characterized on today's noncontrast CT examination, but is likely to represent a cyst. Musculoskeletal/Soft Tissues: There are no aggressive appearing lytic or blastic lesions noted in the visualized portions of the skeleton. IMPRESSION: 1. Chronic left-sided pleural collection is very complicated appearance and does have imaging features that are compatible with recurrent empyema. 2. Severe dilatation of the pulmonic trunk (4.7 cm in diameter) and main pulmonary arteries bilaterally, suggestive of pulmonary arterial hypertension. 3. Additional findings, similar prior studies, as above. Electronically Signed   By: Vinnie Langton M.D.   On: 12/14/2015 15:09   Dg Chest Port 1 View  01/11/2016  CLINICAL DATA:  Sepsis EXAM: PORTABLE CHEST 1 VIEW COMPARISON:  CT chest 12/14/2015.  Chest x-ray 11/23/2015 FINDINGS: Left pleural loculated fluid and pleural scarring unchanged from the prior study. Prior CT demonstrated air-fluid level consistent with hydro pneumothorax. This is difficult to see on the chest x-ray but probably remains. 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 Underlying chronic lung disease with COPD Marked pulmonary artery enlargement with pulmonary artery hypertension again noted. IMPRESSION: Loculated left pleural effusion and pleural scarring unchanged with probable hydro pneumothorax on the left as seen on the prior CT of 12/14/2015. Severe pulmonary artery hypertension. No new findings compared with earlier studies. Electronically Signed   By: Franchot Gallo M.D.   On: 01/11/2016 21:18    Oren Binet, MD  [facility] Pager:[phone]  If 7PM-7AM, please contact night-coverage [email or web address] Password [email or web address] 01/12/2016, 12:48 PM   LOS: 1 day

## 2016-01-12 NOTE — Progress Notes (Addendum)
[address]       [place], [address]             [phone]          Subjective: Feels some SOB  Objective: Vital signs in last 24 hours: Temp:  [98.6 F (37 C)-102.6 F (39.2 C)] 98.6 F (37 C) (03/10 0339) Pulse Rate:  [84-101] 84 (03/10 0339) Cardiac Rhythm:  [-] Normal sinus rhythm (03/10 0700) Resp:  [18-20] 18 (03/10 0339) BP: (113-161)/(69-97) 113/69 mmHg (03/10 0339) SpO2:  [83 %-98 %] 96 % (03/10 0339) Weight:  [91 lb 9.6 oz (41.549 kg)] 91 lb 9.6 oz (41.549 kg) (03/09 1612)  Hemodynamic parameters for last 24 hours:    Intake/Output from previous day:   Intake/Output this shift:    General appearance: alert, cooperative and no distress Heart: regular rate and rhythm Lungs: dim on left lower fields  Lab Results:  Recent Labs  01/11/16 2135 01/12/16 0121  WBC 6.9 5.7  HGB 12.8* 11.4*  HCT 40.2 37.3*  PLT 308 323   BMET:  Recent Labs  01/11/16 2135 01/12/16 0121 01/12/16 0303  NA 135  --  137  K 4.3  --  4.5  CL 96*  --  97*  CO2 33*  --  29  GLUCOSE 106*  --  106*  BUN <5*  --  6  CREATININE 1.08 1.11 1.06  CALCIUM 8.5*  --  8.2*    PT/INR:  Recent Labs  01/11/16 2135  LABPROT 14.6  INR 1.12   ABG    Component Value Date/Time   PHART 7.331* 10/03/2015 0317   HCO3 31.4* 10/03/2015 0317   TCO2 33.2 10/03/2015 0317   O2SAT 99.3 10/03/2015 0317   CBG (last 3)  No results for input(s): GLUCAP in the last 72 hours.  Meds Scheduled Meds: . enoxaparin (LOVENOX) injection  20 mg Subcutaneous Q24H  . pantoprazole  40 mg Oral Daily  . piperacillin-tazobactam (ZOSYN)  IV  3.375 g Intravenous 3 times per day  . sodium chloride flush  3 mL Intravenous Q12H  . vancomycin  1,000 mg Intravenous Q24H   Continuous Infusions: . sodium chloride 50 mL/hr at 01/12/16 0235   PRN Meds:.acetaminophen **OR** acetaminophen, benzonatate, HYDROcodone-acetaminophen, ipratropium-albuterol, ondansetron **OR** ondansetron  (ZOFRAN) IV  Xrays Dg Chest Port 1 View  01/11/2016  CLINICAL DATA:  Sepsis EXAM: PORTABLE CHEST 1 VIEW COMPARISON:  CT chest 12/14/2015.  Chest x-ray 11/23/2015 FINDINGS: Left pleural loculated fluid and pleural scarring unchanged from the prior study. Prior CT demonstrated air-fluid level consistent with hydro pneumothorax. This is difficult to see on the chest x-ray but probably remains. 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 Underlying chronic lung disease with COPD Marked pulmonary artery enlargement with pulmonary artery hypertension again noted. IMPRESSION: Loculated left pleural effusion and pleural scarring unchanged with probable hydro pneumothorax on the left as seen on the prior CT of 12/14/2015. Severe pulmonary artery hypertension. No new findings compared with earlier studies. Electronically Signed   By: Franchot Gallo M.D.   On: 01/11/2016 21:18    Assessment/Plan:  1 on zosy/vanc broad spectrum coverage, tm 102.6, no leukocytosis   LOS: 1 day    GOLD,[name] 01/12/2016  Patient well known , seen in office yesterday Have ordered ct guided drainage of left pl;eural space I have seen and examined [name] and agree with the above assessment  and plan.  [name] MD [facility] [phone] Office [phone]  01/12/2016 9:54 AM

## 2016-01-12 NOTE — Sedation Documentation (Signed)
Pt taken to radiology for upright chest X-Ray.

## 2016-01-12 NOTE — Consult Note (Signed)
[facility]    Date of Admission:  01/11/2016           Day 2 vancomycin        Day 2 piperacillin tazobactam       Reason for Consult: Fever and probable recurrent polymicrobial aerobic left pleural empyema    Referring Physician: Dr. Oren Binet   Principal Problem:   SIRS (systemic inflammatory response syndrome) (HCC) Active Problems:   Pleural effusion, left   Empyema, left (HCC)   Chest pain   . enoxaparin (LOVENOX) injection  20 mg Subcutaneous Q24H  . fentaNYL      . lidocaine      . midazolam      . pantoprazole  40 mg Oral Daily  . piperacillin-tazobactam (ZOSYN)  IV  3.375 g Intravenous 3 times per day  . sodium chloride flush  3 mL Intravenous Q12H  . vancomycin  1,000 mg Intravenous Q24H    Recommendations: 1. Continue piperacillin tazobactam pending pleural fluid Gram stain and cultures 2. Discontinue vancomycin   Assessment: I suspect that his fever and shortness of breath or empyema. I will treat piperacillin tazobactam alone pending Gram stain and cultures. We will follow with you.    HPI: [name] [name] is a 55 y.o. malewith a long history of chronic left pleural effusion. He underwent thoracotomy and decortication in 2003. He had persistent left effusion. He was treated for community-acquired pneumonia in 2012. He was readmitted last November with worsening shortness of breath. He underwent VATS drainage of what appeared to be a chronic empyema followed by decortication again on 10/02/2015. Pleural fluid cultures grew multiple organisms. Pleural biopsy was benign. He had a chest tube placed on 10/07/2015 and was discharged on IV piperacillin tazobactam. He completed 3 weeks of therapy on 11/07/2015. His chest tube had been removed. He appeared to be better after completing antibiotics but recently has developed increased shortness of breath, hypoxia and fever. He was readmitted yesterday with temperature of 102.6. Chest  x-ray did not reveal any change in his chronic effusion. Today he underwent placement of another left chest tube. "Turbid, bloody fluid" was obtained. Gram stain is pending.  Review of Systems: Review of Systems  Unable to perform ROS: mental acuity    Past Medical History  Diagnosis Date  . Kidney stones   . Hypertension     Social History  Substance Use Topics  . Smoking status: Never Smoker   . Smokeless tobacco: Not on file  . Alcohol Use: Yes    No family history on file. No Known Allergies  OBJECTIVE: Blood pressure 121/74, pulse 95, temperature 100.5 F (38.1 C), temperature source Oral, resp. rate 21, SpO2 96 %.  Physical Exam  Constitutional:  He is very groggy following his recent procedure.  Cardiovascular: Normal rate and regular rhythm.   No murmur heard. Pulmonary/Chest: Breath sounds normal.  Quiet, shallow respirations.  Abdominal: Soft.  Skin: No rash noted.    Lab Results Lab Results  Component Value Date   WBC 5.7 01/12/2016   HGB 11.4* 01/12/2016   HCT 37.3* 01/12/2016   MCV 84.8 01/12/2016   PLT 323 01/12/2016    Lab Results  Component Value Date   CREATININE 1.06 01/12/2016   BUN 6 01/12/2016   NA 137 01/12/2016   K 4.5 01/12/2016   CL 97* 01/12/2016   CO2 29 01/12/2016    Lab Results  Component Value Date  ALT 17 01/12/2016   AST 26 01/12/2016   ALKPHOS 97 01/12/2016   BILITOT 0.4 01/12/2016     Microbiology: Recent Results (from the past 240 hour(s))  Culture, blood (x 2)     Status: None (Preliminary result)   Collection Time: 01/11/16  9:40 PM  Result Value Ref Range Status   Specimen Description BLOOD RIGHT ANTECUBITAL  Final   Special Requests BOTTLES DRAWN AEROBIC AND ANAEROBIC 10CC EACH  Final   Culture NO GROWTH < 24 HOURS  Final   Report Status PENDING  Incomplete  Culture, blood (x 2)     Status: None (Preliminary result)   Collection Time: 01/11/16  9:40 PM  Result Value Ref Range Status   Specimen  Description BLOOD LEFT ARM  Final   Special Requests BOTTLES DRAWN AEROBIC AND ANAEROBIC 5CC EACH  Final   Culture NO GROWTH < 24 HOURS  Final   Report Status PENDING  Incomplete    Michel Bickers, MD [facility] [phone] pager   [phone] cell 01/12/2016, 3:02 PM

## 2016-01-12 NOTE — Progress Notes (Signed)
Patient lying in bed, call light within reach

## 2016-01-12 NOTE — H&P (Signed)
Chief Complaint: Empyema  Referring Physician(s): Lanelle Bal  Supervising Physician: Jacqulynn Cadet  History of Present Illness: [name] [name] is a 55 y.o. male with a past medical history of urolithiasis, hypertension, who was admitted due to chronic empyema.  He is known to our service as Dr. Earleen Newport placed a chest tube on 10/17/2015.  He was seen by  Dr. Lanelle Bal yesterday in clinic due to persistent episodes of shortness of breath, fatigue and malaise.   He denies having fever or chills when seen at his office, but he was febrile on arrival to the hospital yesterday.  Per chart, he was admitted in November for empyema, underwent a VATS procedure, chest tube placement and PICC line placement for outpatient IV antibiotic therapy.   He  missed his previous appointment with cardiothoracic surgery and other providers, instead he kept going to the emergency department for care. His chest tube was eventually removed by the emergency department.  We are asked to evaluate patient for CT guided placement of another chest tube on the left to treat empyema.  He is NPO  He had sub-cu Lovenox last night for DVT prophylaxis. Dr. Laurence Ferrari is aware.  Past Medical History  Diagnosis Date  . Kidney stones   . Hypertension     Past Surgical History  Procedure Laterality Date  . Kidney surgery    . Video assisted thoracoscopy (vats)/decortication Left 2003  . Video assisted thoracoscopy (vats)/decortication Left 10/02/2015    Procedure: VIDEO ASSISTED THORACOSCOPY (VATS)/DECORTICATION and drainage of chronic empyena;  Surgeon: [name], MD;  Location: [facility];  Service: Thoracic;  Laterality: Left;  . Video bronchoscopy N/A 10/02/2015    Procedure: VIDEO BRONCHOSCOPY;  Surgeon: [name], MD;  Location: [facility] OR;  Service: Thoracic;  Laterality: N/A;    Allergies: Review of patient's allergies indicates no known allergies.  Medications: Prior to Admission  medications   Not on File     No family history on file.  Social History   Social History  . Marital Status: Single    Spouse Name: N/A  . Number of Children: N/A  . Years of Education: N/A   Social History Main Topics  . Smoking status: Never Smoker   . Smokeless tobacco: Not on file  . Alcohol Use: Yes  . Drug Use: No  . Sexual Activity: Not on file   Other Topics Concern  . Not on file   Social History Narrative     Review of Systems: A 12 point ROS discussed  Review of Systems  Constitutional: Positive for fever and activity change. Negative for chills, appetite change and fatigue.  HENT: Negative.   Respiratory: Positive for cough and shortness of breath. Negative for wheezing.   Cardiovascular: Negative for chest pain.  Gastrointestinal: Negative for nausea and vomiting.  Genitourinary: Negative.   Musculoskeletal: Negative.   Skin: Negative.   Neurological: Negative.   Hematological: Negative.   Psychiatric/Behavioral: Negative.     Vital Signs: BP 113/69 mmHg  Pulse 84  Temp(Src) 98.6 F (37 C) (Oral)  Resp 18  SpO2 96%  Physical Exam  Constitutional: He is oriented to person, place, and time.  Thin, NAD  HENT:  Head: Normocephalic and atraumatic.  Eyes: EOM are normal.  Neck: Normal range of motion. Neck supple.  Cardiovascular: Normal rate, regular rhythm and normal heart sounds.   Pulmonary/Chest: Effort normal.  Diminished breath sounds on the left  Abdominal: Soft. Bowel sounds are normal. There is no  tenderness.  Musculoskeletal: Normal range of motion.  Neurological: He is alert and oriented to person, place, and time.  Skin: Skin is warm and dry.  Psychiatric: He has a normal mood and affect. His behavior is normal. Judgment and thought content normal.  Vitals reviewed.   Mallampati Score:  MD Evaluation Airway: WNL Heart: WNL Abdomen: WNL Chest/ Lungs: Other (comments) Chest/ lungs comments: Diminished breath sounds on the  left c/w empyema ASA  Classification: 3 Mallampati/Airway Score: Two  Imaging: Ct Chest Wo Contrast  12/14/2015  CLINICAL DATA:  55 year old male with history of empyema. Clinical suspicion for recurrent empyema. History of prior thoracoscopic decortication and drainage. Cough, shortness of breath and occasional hemoptysis. EXAM: CT CHEST WITHOUT CONTRAST TECHNIQUE: Multidetector CT imaging of the chest was performed following the standard protocol without IV contrast. COMPARISON:  Chest CT 10/11/2015. FINDINGS: Mediastinum/Lymph Nodes: Heart size is normal. There is no significant pericardial fluid, thickening or pericardial calcification. Severe dilatation of the pulmonic trunk (4.7 cm in diameter) and the main pulmonary arteries, strongly suggestive of pulmonary arterial hypertension. No pathologically enlarged mediastinal or hilar lymph nodes. Please note that accurate exclusion of hilar adenopathy is limited on noncontrast CT scans. Esophagus is unremarkable in appearance. No axillary lymphadenopathy. 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 In the left hemithorax there is again a thick walled chronic pleural fluid collection. This collection is heterogeneous in attenuation, and there are multiple internal locules of gas, the overall appearance of which is concerning for recurrent empyema. Large collections of gas are noted on image 41 of series 4, and on image 21 of series 4 nondependently within this collection. 7 mm ground-glass attenuation nodule in the inferior aspect of the right upper lobe abutting the minor fissure is unchanged (image 36 of series 4). No other larger more suspicious appearing pulmonary nodules or masses are noted. Areas of chronic atelectasis and scarring are noted in the left lung adjacent to this chronic left pleural fluid collection,  as well as in the basal segments of the right lower lobe. Extensive bronchial wall thickening with patchy areas of mild cylindrical bronchiectasis. Upper Abdomen: Incompletely visualized 12 mm low-attenuation lesion in the upper pole of the left kidney is not characterized on today's noncontrast CT examination, but is likely to represent a cyst. Musculoskeletal/Soft Tissues: There are no aggressive appearing lytic or blastic lesions noted in the visualized portions of the skeleton. IMPRESSION: 1. Chronic left-sided pleural collection is very complicated appearance and does have imaging features that are compatible with recurrent empyema. 2. Severe dilatation of the pulmonic trunk (4.7 cm in diameter) and main pulmonary arteries bilaterally, suggestive of pulmonary arterial hypertension. 3. Additional findings, similar prior studies, as above. Electronically Signed   By: Vinnie Langton M.D.   On: 12/14/2015 15:09   Dg Chest Port 1 View  01/11/2016  CLINICAL DATA:  Sepsis EXAM: PORTABLE CHEST 1 VIEW COMPARISON:  CT chest 12/14/2015.  Chest x-ray 11/23/2015 FINDINGS: Left pleural loculated fluid and pleural scarring unchanged from the prior study. Prior CT demonstrated air-fluid level consistent with hydro pneumothorax. This is difficult to see on the chest x-ray but probably remains. 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 Underlying chronic lung disease with COPD Marked pulmonary artery enlargement with pulmonary artery hypertension again noted. IMPRESSION: Loculated left pleural effusion and pleural scarring unchanged with probable hydro pneumothorax on the left as seen on the  prior CT of 12/14/2015. Severe pulmonary artery hypertension. No new findings compared with earlier studies. Electronically Signed   By: Franchot Gallo M.D.   On: 01/11/2016 21:18    Labs:  CBC:  Recent Labs  10/14/15 0315 10/16/15 0233 01/11/16 2135 01/12/16 0121  WBC 11.3* 10.1 6.9 5.7  HGB 10.7* 11.0* 12.8*  11.4*  HCT 32.4* 33.2* 40.2 37.3*  PLT 364 406* 308 323    COAGS:  Recent Labs  09/26/15 1437 10/16/15 0854 01/11/16 2135  INR 1.16 1.18 1.12  APTT 32 34 32    BMP:  Recent Labs  10/18/15 0348 10/20/15 0422 01/11/16 2135 01/12/16 0121 01/12/16 0303  NA 135 134* 135  --  137  K 3.6 4.2 4.3  --  4.5  CL 96* 97* 96*  --  97*  CO2 32 30 33*  --  29  GLUCOSE 112* 101* 106*  --  106*  BUN 8 7 <5*  --  6  CALCIUM 8.4* 8.2* 8.5*  --  8.2*  CREATININE 1.38* 1.48* 1.08 1.11 1.06  GFRNONAA 57* 52* >60 >60 >60  GFRAA >60 >60 >60 >60 >60    LIVER FUNCTION TESTS:  Recent Labs  10/04/15 0520 10/16/15 0233 01/11/16 2135 01/12/16 0303  BILITOT 0.3 0.7 0.4 0.4  AST 39 46* 32 26  ALT 41 53 18 17  ALKPHOS 51 77 109 97  PROT 6.6 7.4 8.8* 7.7  ALBUMIN 2.1* 2.0* 2.9* 2.6*    TUMOR MARKERS: No results for input(s): AFPTM, CEA, CA199, CHROMGRNA in the last 8760 hours.  Assessment and Plan:  Chronic empyema   Chest tube by Dr. Earleen Newport in December.  Removed in the ED 10/27/2015.  Will proceed with image guided placement of pigtail chest tube on the left today by Dr. Laurence Ferrari  Thank you for this interesting consult.  I greatly enjoyed meeting [name] [name] and look forward to participating in their care.  A copy of this report was sent to the requesting provider on this date.  Electronically Signed: Murrell Redden PA-C 01/12/2016, 11:33 AM   I spent a total of  25 Minutes in face to face in clinical consultation, greater than 50% of which was counseling/coordinating care for CT guided chest tube placement

## 2016-01-13 ENCOUNTER — Other Ambulatory Visit (HOSPITAL_COMMUNITY): Payer: BLUE CROSS/BLUE SHIELD

## 2016-01-13 DIAGNOSIS — I272 Pulmonary hypertension, unspecified: Secondary | ICD-10-CM | POA: Insufficient documentation

## 2016-01-13 DIAGNOSIS — R509 Fever, unspecified: Secondary | ICD-10-CM

## 2016-01-13 DIAGNOSIS — E875 Hyperkalemia: Secondary | ICD-10-CM

## 2016-01-13 DIAGNOSIS — J90 Pleural effusion, not elsewhere classified: Secondary | ICD-10-CM

## 2016-01-13 LAB — CBC WITH DIFFERENTIAL/PLATELET
BASOS ABS: 0 10*3/uL (ref 0.0–0.1)
BASOS PCT: 0 %
Eosinophils Absolute: 0 10*3/uL (ref 0.0–0.7)
Eosinophils Relative: 0 %
HEMATOCRIT: 41.5 % (ref 39.0–52.0)
Hemoglobin: 12.3 g/dL — ABNORMAL LOW (ref 13.0–17.0)
LYMPHS PCT: 31 %
Lymphs Abs: 1.5 10*3/uL (ref 0.7–4.0)
MCH: 25.9 pg — ABNORMAL LOW (ref 26.0–34.0)
MCHC: 29.6 g/dL — ABNORMAL LOW (ref 30.0–36.0)
MCV: 87.4 fL (ref 78.0–100.0)
MONO ABS: 0.5 10*3/uL (ref 0.1–1.0)
Monocytes Relative: 11 %
NEUTROS ABS: 2.7 10*3/uL (ref 1.7–7.7)
NEUTROS PCT: 58 %
PLATELETS: 285 10*3/uL (ref 150–400)
RBC: 4.75 MIL/uL (ref 4.22–5.81)
RDW: 15 % (ref 11.5–15.5)
WBC: 4.7 10*3/uL (ref 4.0–10.5)

## 2016-01-13 LAB — COMPREHENSIVE METABOLIC PANEL
ALBUMIN: 2.5 g/dL — AB (ref 3.5–5.0)
ALT: 17 U/L (ref 17–63)
AST: 28 U/L (ref 15–41)
Alkaline Phosphatase: 86 U/L (ref 38–126)
Anion gap: 5 (ref 5–15)
BILIRUBIN TOTAL: 0.1 mg/dL — AB (ref 0.3–1.2)
BUN: 7 mg/dL (ref 6–20)
CHLORIDE: 94 mmol/L — AB (ref 101–111)
CO2: 36 mmol/L — ABNORMAL HIGH (ref 22–32)
CREATININE: 1.18 mg/dL (ref 0.61–1.24)
Calcium: 8.1 mg/dL — ABNORMAL LOW (ref 8.9–10.3)
GFR calc Af Amer: >60 mL/min (ref >60)
GFR calc non Af Amer: >60 mL/min (ref >60)
GLUCOSE: 120 mg/dL — AB (ref 65–99)
POTASSIUM: 5.2 mmol/L — AB (ref 3.5–5.1)
Sodium: 135 mmol/L (ref 135–145)
Total Protein: 8.3 g/dL — ABNORMAL HIGH (ref 6.5–8.1)

## 2016-01-13 MED ORDER — VANCOMYCIN HCL IN DEXTROSE 1-5 GM/200ML-% IV SOLN
1000.0000 mg | INTRAVENOUS | Status: DC
  Filled 2016-01-13: qty 200

## 2016-01-13 MED ORDER — VANCOMYCIN HCL IN DEXTROSE 750-5 MG/150ML-% IV SOLN
750.0000 mg | Freq: Once | INTRAVENOUS | Status: DC
  Filled 2016-01-13: qty 150

## 2016-01-13 MED ORDER — SODIUM POLYSTYRENE SULFONATE 15 GM/60ML PO SUSP
30.0000 g | Freq: Once | ORAL | Status: AC
  Administered 2016-01-13: 30 g via ORAL
  Filled 2016-01-13: qty 120

## 2016-01-13 NOTE — Progress Notes (Addendum)
[address]       [place] [address]             [phone]          Subjective: Doesn't feel well but fairly nonspecific , + hemoptysis, some SOB  Objective: Vital signs in last 24 hours: Temp:  [98.4 F (36.9 C)-102.6 F (39.2 C)] 98.4 F (36.9 C) (03/11 1300) Pulse Rate:  [89-93] 92 (03/11 1300) Cardiac Rhythm:  [-] Normal sinus rhythm (03/11 0701) Resp:  [18-20] 20 (03/11 1300) BP: (112-127)/(60-76) 112/60 mmHg (03/11 1300) SpO2:  [90 %-98 %] 90 % (03/11 1300)  Hemodynamic parameters for last 24 hours:    Intake/Output from previous day: 03/10 0701 - 03/11 0700 In: 240 [P.O.:240] Out: 1010 [Urine:1000; Chest Tube:10] Intake/Output this shift: Total I/O In: -  Out: 300 [Urine:300]  General appearance: alert, cooperative and no distress Heart: regular rate and rhythm Lungs: di left lower fields Abdomen: benign Extremities: no edema  Lab Results:  Recent Labs  01/12/16 0121 01/13/16 0256  WBC 5.7 4.7  HGB 11.4* 12.3*  HCT 37.3* 41.5  PLT 323 285   BMET:  Recent Labs  01/12/16 0303 01/13/16 0256  NA 137 135  K 4.5 5.2*  CL 97* 94*  CO2 29 36*  GLUCOSE 106* 120*  BUN 6 7  CREATININE 1.06 1.18  CALCIUM 8.2* 8.1*    PT/INR:  Recent Labs  01/11/16 2135  LABPROT 14.6  INR 1.12   ABG    Component Value Date/Time   PHART 7.331* 10/03/2015 0317   HCO3 31.4* 10/03/2015 0317   TCO2 33.2 10/03/2015 0317   O2SAT 99.3 10/03/2015 0317   CBG (last 3)  No results for input(s): GLUCAP in the last 72 hours.  Meds Scheduled Meds: . enoxaparin (LOVENOX) injection  20 mg Subcutaneous Q24H  . pantoprazole  40 mg Oral Daily  . piperacillin-tazobactam (ZOSYN)  IV  3.375 g Intravenous 3 times per day  . sodium chloride flush  3 mL Intravenous Q12H   Continuous Infusions:  PRN Meds:.acetaminophen **OR** acetaminophen, benzonatate, HYDROcodone-acetaminophen, ipratropium-albuterol, ondansetron **OR** ondansetron (ZOFRAN)  IV  Xrays Dg Chest 1 View  01/12/2016  CLINICAL DATA:  Patient status post catheter placement within the left chest. EXAM: CHEST 1 VIEW COMPARISON:  Chest radiograph 01/11/2016 FINDINGS: Stable cardiac and mediastinal contours with marked enlargement of the main pulmonary artery. Unchanged scattered bilateral heterogeneous pulmonary opacities, likely scarring. Interval placement of left pleural drainage catheter into a loculated left hydropneumothorax. Regional skeleton is unremarkable. IMPRESSION: Interval placement left pigtail drainage catheter into loculated left hydropneumothorax. Electronically Signed   By: Lovey Newcomer M.D.   On: 01/12/2016 14:23   Dg Chest Port 1 View  01/11/2016  CLINICAL DATA:  Sepsis EXAM: PORTABLE CHEST 1 VIEW COMPARISON:  CT chest 12/14/2015.  Chest x-ray 11/23/2015 FINDINGS: Left pleural loculated fluid and pleural scarring unchanged from the prior study. Prior CT demonstrated air-fluid level consistent with hydro pneumothorax. This is difficult to see on the chest x-ray but probably remains. 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 Underlying chronic lung disease with COPD Marked pulmonary artery enlargement with pulmonary artery hypertension again noted. IMPRESSION: Loculated left pleural effusion and pleural scarring unchanged with probable hydro pneumothorax on the left as seen on the prior CT of 12/14/2015. Severe pulmonary artery hypertension. No new findings compared with earlier studies. Electronically Signed   By: Franchot Gallo M.D.   On: 01/11/2016 21:18   Ct Perc  Pleural Drain W/indwell Cath W/img Guide  01/12/2016  INDICATION: 55 year old male with concern for recurrent left-sided empyema. CT-guided drainage is requested. EXAM: CT PERC PLEURAL DRAIN W/INDWELL CATH W/IMG GUIDE MEDICATIONS: The patient is currently admitted to the hospital and receiving intravenous antibiotics. The antibiotics were administered within an appropriate time frame prior to the  initiation of the procedure. ANESTHESIA/SEDATION: Fentanyl  mcg IV; Versed  mg IV Moderate Sedation Time: The patient was continuously monitored during the procedure by the interventional radiology nurse under my direct supervision. COMPLICATIONS: None immediate. Estimated blood loss: 0 PROCEDURE: Informed written consent was obtained from the patient after a thorough discussion of the procedural risks, benefits and alternatives. All questions were addressed. A timeout was performed prior to the initiation of the procedure. A planning axial CT scan was performed. A thick-walled loculated hydro pneumothorax is identified on the left. A suitable skin entry site was selected and marked. The region was sterilely prepped and draped in standard fashion using chlorhexidine skin prep. Local anesthesia was attained by infiltration with 1% lidocaine. A small dermatotomy was placed. Under intermittent CT guidance an 18 gauge trocar needle was advanced through the rib space and into the empyema. An Amplatz wire was then advanced through the trocar needle and directed toward the lung apex. The needle was removed. The skin tract was dilated to 67 [place] and a Cook 14 [place] all-purpose drainage catheter modified with multiple additional sideholes was advanced over the wire. The drain was connected to low wall suction via a pleura vac. Repeat CT imaging demonstrates excellent placement of the drain. All additional sideholes are within the pleural space and spanned the entirety of the empyema. Aspiration yields approximately 30 mL turbid bloody fluid. This was sent to the lab for culture. The tube was secured to the skin with 0 Prolene suture. An air tight bandage was applied. The patient tolerated the procedure well. IMPRESSION: Successful placement of a 33 French all-purpose drainage catheter modified with additional sideholes into the thick walled left-sided hydro pneumothorax. The aspirated material appears to be bloody.  Signed, [name] Peaches, MD [facility] Electronically Signed   By: Jacqulynn Cadet M.D.   On: 01/12/2016 14:32    Assessment/Plan:  LOS: 2 days   1 management as per primary 2 leave CT in place 3 needs echo and poss further eval for hemoptysis  [name] [name],[name] [name] 01/13/2016  Waiting for follow up echo, very large PA noted on previous echo and cta  Cultures pending from ct directed chest tube I have seen and examined [name] [name] and agree with the above assessment  and plan.  [name] Isaac MD Beeper [phone] Office [phone] 01/13/2016 4:26 PM

## 2016-01-13 NOTE — Progress Notes (Signed)
Referring Physician(s): Dr  Lanelle Bal  Supervising Physician: Jacqulynn Cadet  Chief Complaint:  Left recurrent empyema Chest tube placed 3/10 in IR  Subjective:  Feels little better today 50 cc in pleurvac Cloudy serous fluid Tmax 102.6  Allergies: Review of patient's allergies indicates no known allergies.  Medications: Prior to Admission medications   Not on File     Vital Signs: BP 127/71 mmHg  Pulse 93  Temp(Src) 100.3 F (37.9 C) (Oral)  Resp 18  SpO2 91%  Physical Exam  Pulmonary/Chest: Effort normal. He has wheezes.  Skin: Skin is warm and dry.  Left posterior chest site of pigtail catheter is clean and dry Sl tender No bleeding  - air leak  50 cc cloudy serous fluid in Pleurvac cx pending     Imaging: Dg Chest 1 View  01/12/2016  CLINICAL DATA:  Patient status post catheter placement within the left chest. EXAM: CHEST 1 VIEW COMPARISON:  Chest radiograph 01/11/2016 FINDINGS: Stable cardiac and mediastinal contours with marked enlargement of the main pulmonary artery. Unchanged scattered bilateral heterogeneous pulmonary opacities, likely scarring. Interval placement of left pleural drainage catheter into a loculated left hydropneumothorax. Regional skeleton is unremarkable. IMPRESSION: Interval placement left pigtail drainage catheter into loculated left hydropneumothorax. Electronically Signed   By: Lovey Newcomer M.D.   On: 01/12/2016 14:23   Dg Chest Port 1 View  01/11/2016  CLINICAL DATA:  Sepsis EXAM: PORTABLE CHEST 1 VIEW COMPARISON:  CT chest 12/14/2015.  Chest x-ray 11/23/2015 FINDINGS: Left pleural loculated fluid and pleural scarring unchanged from the prior study. Prior CT demonstrated air-fluid level consistent with hydro pneumothorax. This is difficult to see on the chest x-ray but probably remains. 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 Underlying chronic lung disease with COPD Marked pulmonary artery enlargement with  pulmonary artery hypertension again noted. IMPRESSION: Loculated left pleural effusion and pleural scarring unchanged with probable hydro pneumothorax on the left as seen on the prior CT of 12/14/2015. Severe pulmonary artery hypertension. No new findings compared with earlier studies. Electronically Signed   By: Franchot Gallo M.D.   On: 01/11/2016 21:18   Ct Perc Pleural Drain W/indwell Cath W/img Guide  01/12/2016  INDICATION: 55 year old male with concern for recurrent left-sided empyema. CT-guided drainage is requested. EXAM: CT PERC PLEURAL DRAIN W/INDWELL CATH W/IMG GUIDE MEDICATIONS: The patient is currently admitted to the hospital and receiving intravenous antibiotics. The antibiotics were administered within an appropriate time frame prior to the initiation of the procedure. ANESTHESIA/SEDATION: Fentanyl  mcg IV; Versed  mg IV Moderate Sedation Time: The patient was continuously monitored during the procedure by the interventional radiology nurse under my direct supervision. COMPLICATIONS: None immediate. Estimated blood loss: 0 PROCEDURE: Informed written consent was obtained from the patient after a thorough discussion of the procedural risks, benefits and alternatives. All questions were addressed. A timeout was performed prior to the initiation of the procedure. A planning axial CT scan was performed. A thick-walled loculated hydro pneumothorax is identified on the left. A suitable skin entry site was selected and marked. The region was sterilely prepped and draped in standard fashion using chlorhexidine skin prep. Local anesthesia was attained by infiltration with 1% lidocaine. A small dermatotomy was placed. Under intermittent CT guidance an 18 gauge trocar needle was advanced through the rib space and into the empyema. An Amplatz wire was then advanced through the trocar needle and directed toward the lung apex. The needle was removed. The skin tract was dilated to 14  [place] and a [place] Axon 14 [place]  all-purpose drainage catheter modified with multiple additional sideholes was advanced over the wire. The drain was connected to low wall suction via a pleura vac. Repeat CT imaging demonstrates excellent placement of the drain. All additional sideholes are within the pleural space and spanned the entirety of the empyema. Aspiration yields approximately 30 mL turbid bloody fluid. This was sent to the lab for culture. The tube was secured to the skin with 0 Prolene suture. An air tight bandage was applied. The patient tolerated the procedure well. IMPRESSION: Successful placement of a 88 French all-purpose drainage catheter modified with additional sideholes into the thick walled left-sided hydro pneumothorax. The aspirated material appears to be bloody. Signed, Criselda Peaches, MD [facility] Electronically Signed   By: Jacqulynn Cadet M.D.   On: 01/12/2016 14:32    Labs:  CBC:  Recent Labs  10/16/15 0233 01/11/16 2135 01/12/16 0121 01/13/16 0256  WBC 10.1 6.9 5.7 4.7  HGB 11.0* 12.8* 11.4* 12.3*  HCT 33.2* 40.2 37.3* 41.5  PLT 406* 308 323 285    COAGS:  Recent Labs  09/26/15 1437 10/16/15 0854 01/11/16 2135  INR 1.16 1.18 1.12  APTT 32 34 32    BMP:  Recent Labs  10/20/15 0422 01/11/16 2135 01/12/16 0121 01/12/16 0303 01/13/16 0256  NA 134* 135  --  137 135  K 4.2 4.3  --  4.5 5.2*  CL 97* 96*  --  97* 94*  CO2 30 33*  --  29 36*  GLUCOSE 101* 106*  --  106* 120*  BUN 7 <5*  --  6 7  CALCIUM 8.2* 8.5*  --  8.2* 8.1*  CREATININE 1.48* 1.08 1.11 1.06 1.18  GFRNONAA 52* >60 >60 >60 >60  GFRAA >60 >60 >60 >60 >60    LIVER FUNCTION TESTS:  Recent Labs  10/16/15 0233 01/11/16 2135 01/12/16 0303 01/13/16 0256  BILITOT 0.7 0.4 0.4 0.1*  AST 46* 32 26 28  ALT 53 18 17 17   ALKPHOS 77 109 97 86  PROT 7.4 8.8* 7.7 8.3*  ALBUMIN 2.0* 2.9* 2.6* 2.5*    Assessment and Plan:  Recurrent L  empyema Chest tube intact Will follow Plan per Dr Servando Snare  Electronically Signed: Monia Sabal A 01/13/2016, 12:22 PM   I spent a total of 15 Minutes at the the patient's bedside AND on the patient's hospital floor or unit, greater than 50% of which was counseling/coordinating care for L chest tube placement/empyema

## 2016-01-13 NOTE — Progress Notes (Signed)
PATIENT DETAILS Name: [name] [name] Age: 55 y.o. Sex: male Date of Birth: 1961/10/15 Admit Date: 01/11/2016 Admitting Physician Geradine Girt, DO PCP:No primary care provider on file.  Subjective: Feels better, was febrile last night.  Assessment/Plan: Principal Problem: SIRS (systemic inflammatory response syndrome): SIRS slowly resolving-but continues to be intermittently febrile. Secondary to recurrent left-sided empyema. Blood cultures negative, continue Zosyn.   Active Problems: Recurrent left-sided empyema: Seen by cardiothoracic surgery, underwent CT-guided chest tube placement on 3/10. Pleural fluid cultures growing gram-positive cocci in clusters. Await ID follow-up, continue Zosyn. Follow.    Hyperkalemia: Mild, one dose of Kayexalate today-repeat lytes tomorrow.  Pulmonary hypertension: Obtain echocardiogram-follow.  Disposition: Remain inpatient  Antimicrobial agents  See below  Anti-infectives    Start     Dose/Rate Route Frequency Ordered Stop   01/12/16 2330  vancomycin (VANCOCIN) IVPB 1000 mg/200 mL premix  Status:  Discontinued     1,000 mg 200 mL/hr over 60 Minutes Intravenous Every 24 hours 01/11/16 2256 01/12/16 1519   01/12/16 0600  piperacillin-tazobactam (ZOSYN) IVPB 3.375 g     3.375 g 12.5 mL/hr over 240 Minutes Intravenous 3 times per day 01/11/16 2256     01/11/16 2300  vancomycin (VANCOCIN) IVPB 1000 mg/200 mL premix     1,000 mg 200 mL/hr over 60 Minutes Intravenous STAT 01/11/16 2254 01/12/16 0330   01/11/16 2100  vancomycin (VANCOCIN) IVPB 750 mg/150 ml premix  Status:  Discontinued     750 mg 150 mL/hr over 60 Minutes Intravenous  Once 01/11/16 2055 01/11/16 2254   01/11/16 2045  piperacillin-tazobactam (ZOSYN) IVPB 3.375 g     3.375 g 100 mL/hr over 30 Minutes Intravenous  Once 01/11/16 2044 01/12/16 0406   01/11/16 2045  vancomycin (VANCOCIN) IVPB 1000 mg/200 mL premix  Status:  Discontinued     1,000 mg 200 mL/hr over 60  Minutes Intravenous  Once 01/11/16 2044 01/11/16 2055      DVT Prophylaxis: Prophylactic Lovenox   Code Status: Full code   Family Communication None at bedside  Procedures: None  CONSULTS:  ID and Cardiothoracic surgery  Time spent 20 minutes-Greater than 50% of this time was spent in counseling, explanation of diagnosis, planning of further management, and coordination of care.  MEDICATIONS: Scheduled Meds: . enoxaparin (LOVENOX) injection  20 mg Subcutaneous Q24H  . pantoprazole  40 mg Oral Daily  . piperacillin-tazobactam (ZOSYN)  IV  3.375 g Intravenous 3 times per day  . sodium chloride flush  3 mL Intravenous Q12H   Continuous Infusions:  PRN Meds:.acetaminophen **OR** acetaminophen, benzonatate, HYDROcodone-acetaminophen, ipratropium-albuterol, ondansetron **OR** ondansetron (ZOFRAN) IV    PHYSICAL EXAM: Vital signs in last 24 hours: Filed Vitals:   01/12/16 1641 01/12/16 1939 01/13/16 0508 01/13/16 1300  BP:  116/76 127/71 112/60  Pulse:  89 93 92  Temp: 102.6 F (39.2 C) 98.9 F (37.2 C) 100.3 F (37.9 C) 98.4 F (36.9 C)  TempSrc:  Oral Oral Oral  Resp:  18 18 20   SpO2:  98% 91% 90%    Weight change:  There were no vitals filed for this visit. There is no weight on file to calculate BMI.   Gen Exam: Awake and alert with clear speech.  Neck: Supple, No JVD.   Chest: B/L Clear.   CVS: S1 S2 Regular, no murmurs.  Abdomen: soft, BS +, non tender, non distended.  Extremities: no edema, lower extremities warm  to touch. Neurologic: Non Focal.   Skin: No Rash.   Wounds: N/A.    Intake/Output from previous day:  Intake/Output Summary (Last 24 hours) at 01/13/16 1404 Last data filed at 01/13/16 1402  Gross per 24 hour  Intake    240 ml  Output   1310 ml  Net  -1070 ml     LAB RESULTS: CBC  Recent Labs Lab 01/11/16 2135 01/12/16 0121 01/13/16 0256  WBC 6.9 5.7 4.7  HGB 12.8* 11.4* 12.3*  HCT 40.2 37.3* 41.5  PLT 308 323 285  MCV  85.2 84.8 87.4  MCH 27.1 25.9* 25.9*  MCHC 31.8 30.6 29.6*  RDW 15.1 14.8 15.0  LYMPHSABS 0.8 0.7 1.5  MONOABS 0.7 0.8 0.5  EOSABS 0.1 0.1 0.0  BASOSABS 0.0 0.0 0.0    Chemistries   Recent Labs Lab 01/11/16 2135 01/12/16 0121 01/12/16 0303 01/13/16 0256  NA 135  --  137 135  K 4.3  --  4.5 5.2*  CL 96*  --  97* 94*  CO2 33*  --  29 36*  GLUCOSE 106*  --  106* 120*  BUN <5*  --  6 7  CREATININE 1.08 1.11 1.06 1.18  CALCIUM 8.5*  --  8.2* 8.1*  MG 2.0  --   --   --     CBG: No results for input(s): GLUCAP in the last 168 hours.  GFR Estimated Creatinine Clearance: 42.5 mL/min (by C-G formula based on Cr of 1.18).  Coagulation profile  Recent Labs Lab 01/11/16 2135  INR 1.12    Cardiac Enzymes No results for input(s): CKMB, TROPONINI, MYOGLOBIN in the last 168 hours.  Invalid input(s): CK  Invalid input(s): POCBNP No results for input(s): DDIMER in the last 72 hours. No results for input(s): HGBA1C in the last 72 hours. No results for input(s): CHOL, HDL, LDLCALC, TRIG, CHOLHDL, LDLDIRECT in the last 72 hours. No results for input(s): TSH, T4TOTAL, T3FREE, THYROIDAB in the last 72 hours.  Invalid input(s): FREET3 No results for input(s): VITAMINB12, FOLATE, FERRITIN, TIBC, IRON, RETICCTPCT in the last 72 hours. No results for input(s): LIPASE, AMYLASE in the last 72 hours.  Urine Studies No results for input(s): UHGB, CRYS in the last 72 hours.  Invalid input(s): UACOL, UAPR, USPG, UPH, UTP, UGL, UKET, UBIL, UNIT, UROB, ULEU, UEPI, UWBC, URBC, UBAC, CAST, UCOM, BILUA  MICROBIOLOGY: Recent Results (from the past 240 hour(s))  Culture, blood (x 2)     Status: None (Preliminary result)   Collection Time: 01/11/16  9:40 PM  Result Value Ref Range Status   Specimen Description BLOOD RIGHT ANTECUBITAL  Final   Special Requests BOTTLES DRAWN AEROBIC AND ANAEROBIC 10CC EACH  Final   Culture NO GROWTH < 24 HOURS  Final   Report Status PENDING  Incomplete    Culture, blood (x 2)     Status: None (Preliminary result)   Collection Time: 01/11/16  9:40 PM  Result Value Ref Range Status   Specimen Description BLOOD LEFT ARM  Final   Special Requests BOTTLES DRAWN AEROBIC AND ANAEROBIC 5CC EACH  Final   Culture NO GROWTH < 24 HOURS  Final   Report Status PENDING  Incomplete  Culture, body fluid-bottle     Status: None (Preliminary result)   Collection Time: 01/12/16  1:48 PM  Result Value Ref Range Status   Specimen Description FLUID LEFT PLEURAL  Final   Special Requests BOTTLES DRAWN AEROBIC AND ANAEROBIC 5CC  Final   Gram  Stain   Final    GRAM POSITIVE COCCI IN CLUSTERS ANAEROBIC BOTTLE ONLY CRITICAL RESULT CALLED TO, READ BACK BY AND VERIFIED WITH: A BURTON 01/13/16 @ 83 M VESTAL    Culture GRAM POSITIVE COCCI  Final   Report Status PENDING  Incomplete  Gram stain     Status: None   Collection Time: 01/12/16  1:48 PM  Result Value Ref Range Status   Specimen Description FLUID LEFT PLEURAL  Final   Special Requests NONE  Final   Gram Stain   Final    ABUNDANT WBC PRESENT,BOTH PMN AND MONONUCLEAR NO ORGANISMS SEEN    Report Status 01/12/2016 FINAL  Final    RADIOLOGY STUDIES/RESULTS: Dg Chest 1 View  01/12/2016  CLINICAL DATA:  Patient status post catheter placement within the left chest. EXAM: CHEST 1 VIEW COMPARISON:  Chest radiograph 01/11/2016 FINDINGS: Stable cardiac and mediastinal contours with marked enlargement of the main pulmonary artery. Unchanged scattered bilateral heterogeneous pulmonary opacities, likely scarring. Interval placement of left pleural drainage catheter into a loculated left hydropneumothorax. Regional skeleton is unremarkable. IMPRESSION: Interval placement left pigtail drainage catheter into loculated left hydropneumothorax. Electronically Signed   By: Lovey Newcomer M.D.   On: 01/12/2016 14:23   Ct Chest Wo Contrast  12/14/2015  CLINICAL DATA:  55 year old male with history of empyema. Clinical suspicion  for recurrent empyema. History of prior thoracoscopic decortication and drainage. Cough, shortness of breath and occasional hemoptysis. EXAM: CT CHEST WITHOUT CONTRAST TECHNIQUE: Multidetector CT imaging of the chest was performed following the standard protocol without IV contrast. COMPARISON:  Chest CT 10/11/2015. FINDINGS: Mediastinum/Lymph Nodes: Heart size is normal. There is no significant pericardial fluid, thickening or pericardial calcification. Severe dilatation of the pulmonic trunk (4.7 cm in diameter) and the main pulmonary arteries, strongly suggestive of pulmonary arterial hypertension. No pathologically enlarged mediastinal or hilar lymph nodes. Please note that accurate exclusion of hilar adenopathy is limited on noncontrast CT scans. Esophagus is unremarkable in appearance. No axillary lymphadenopathy. 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 In the left hemithorax there is again a thick walled chronic pleural fluid collection. This collection is heterogeneous in attenuation, and there are multiple internal locules of gas, the overall appearance of which is concerning for recurrent empyema. Large collections of gas are noted on image 41 of series 4, and on image 21 of series 4 nondependently within this collection. 7 mm ground-glass attenuation nodule in the inferior aspect of the right upper lobe abutting the minor fissure is unchanged (image 36 of series 4). No other larger more suspicious appearing pulmonary nodules or masses are noted. Areas of chronic atelectasis and scarring are noted in the left lung adjacent to this chronic left pleural fluid collection, as well as in the basal segments of the right lower lobe. Extensive bronchial wall thickening with patchy areas of mild cylindrical bronchiectasis. Upper Abdomen: Incompletely visualized 12 mm  low-attenuation lesion in the upper pole of the left kidney is not characterized on today's noncontrast CT examination, but is likely to represent a cyst. Musculoskeletal/Soft Tissues: There are no aggressive appearing lytic or blastic lesions noted in the visualized portions of the skeleton. IMPRESSION: 1. Chronic left-sided pleural collection is very complicated appearance and does have imaging features that are compatible with recurrent empyema. 2. Severe dilatation of the pulmonic trunk (4.7 cm in diameter) and main pulmonary arteries bilaterally, suggestive of  pulmonary arterial hypertension. 3. Additional findings, similar prior studies, as above. Electronically Signed   By: Vinnie Langton M.D.   On: 12/14/2015 15:09   Dg Chest Port 1 View  01/11/2016  CLINICAL DATA:  Sepsis EXAM: PORTABLE CHEST 1 VIEW COMPARISON:  CT chest 12/14/2015.  Chest x-ray 11/23/2015 FINDINGS: Left pleural loculated fluid and pleural scarring unchanged from the prior study. Prior CT demonstrated air-fluid level consistent with hydro pneumothorax. This is difficult to see on the chest x-ray but probably remains. 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 Underlying chronic lung disease with COPD Marked pulmonary artery enlargement with pulmonary artery hypertension again noted. IMPRESSION: Loculated left pleural effusion and pleural scarring unchanged with probable hydro pneumothorax on the left as seen on the prior CT of 12/14/2015. Severe pulmonary artery hypertension. No new findings compared with earlier studies. Electronically Signed   By: Franchot Gallo M.D.   On: 01/11/2016 21:18   Ct Perc Pleural Drain W/indwell Cath W/img Guide  01/12/2016  INDICATION: 55 year old male with concern for recurrent left-sided empyema. CT-guided drainage is requested. EXAM: CT PERC PLEURAL DRAIN W/INDWELL CATH W/IMG GUIDE MEDICATIONS: The patient is currently admitted to the hospital and receiving intravenous antibiotics. The  antibiotics were administered within an appropriate time frame prior to the initiation of the procedure. ANESTHESIA/SEDATION: Fentanyl  mcg IV; Versed  mg IV Moderate Sedation Time: The patient was continuously monitored during the procedure by the interventional radiology nurse under my direct supervision. COMPLICATIONS: None immediate. Estimated blood loss: 0 PROCEDURE: Informed written consent was obtained from the patient after a thorough discussion of the procedural risks, benefits and alternatives. All questions were addressed. A timeout was performed prior to the initiation of the procedure. A planning axial CT scan was performed. A thick-walled loculated hydro pneumothorax is identified on the left. A suitable skin entry site was selected and marked. The region was sterilely prepped and draped in standard fashion using chlorhexidine skin prep. Local anesthesia was attained by infiltration with 1% lidocaine. A small dermatotomy was placed. Under intermittent CT guidance an 18 gauge trocar needle was advanced through the rib space and into the empyema. An Amplatz wire was then advanced through the trocar needle and directed toward the lung apex. The needle was removed. The skin tract was dilated to 53 [place] and a Cook 14 [place] all-purpose drainage catheter modified with multiple additional sideholes was advanced over the wire. The drain was connected to low wall suction via a pleura vac. Repeat CT imaging demonstrates excellent placement of the drain. All additional sideholes are within the pleural space and spanned the entirety of the empyema. Aspiration yields approximately 30 mL turbid bloody fluid. This was sent to the lab for culture. The tube was secured to the skin with 0 Prolene suture. An air tight bandage was applied. The patient tolerated the procedure well. IMPRESSION: Successful placement of a 22 French all-purpose drainage catheter modified with additional sideholes into the thick walled  left-sided hydro pneumothorax. The aspirated material appears to be bloody. Signed, Criselda Peaches, MD [facility] Electronically Signed   By: Jacqulynn Cadet M.D.   On: 01/12/2016 14:32    Oren Binet, MD  [facility] Pager:[phone]  If 7PM-7AM, please contact night-coverage [email or web address] Password [email or web address] 01/13/2016, 2:04 PM   LOS: 2 days

## 2016-01-13 NOTE — Progress Notes (Signed)
Pharmacy Antibiotic Note [name] [name] is a 55 y.o. male who was a direct admit from the [facility] office on 01/11/2016 after presenting with SOB and need for a CT placement. The patient has a history of a chronic empyema that was surgically drained in '03 and again in '16. He has failed to make follow-up appointments to follow-up on pulmonary hyperension or L-empyema drainage so the patient was admitted for treatment. Pharmacy has been consulted for to (re)start Vancomycin + Zosyn for r/o sepsis in the setting of a recurrent empyema. Temp 98.4, WBC wnl, SCr 1.18, CrCl~45-50 ml/min.   Of noting, in December '16 - the patient was treated with Vancomycin + Zosyn inpatient and then discharged out to complete 3 weeks of IV Zosyn per ID recommendations. Treatment was completed on 11/07/15.  Plan: 1. Re-start Vancomycin 1g IV every 24 hours; VT as SS, goal 15 - 20 2. Continue Zosyn 3.375g every 8 hours (infused over 4 hours) 3. Will continue to follow renal function, culture results, LOT, and antibiotic de-escalation plans  4. Following along with you daily    Temp (24hrs), Avg:99.2 F (37.3 C), Min:98.4 F (36.9 C), Max:100.3 F (37.9 C)   Recent Labs Lab 01/11/16 2135 01/12/16 0007 01/12/16 0121 01/12/16 0303 01/13/16 0256  WBC 6.9  --  5.7  --  4.7  CREATININE 1.08  --  1.11 1.06 1.18  LATICACIDVEN 0.7 0.6  --   --   --    No Known Allergies  Antimicrobials this admission: Vanc 3/9 x 1 dose resumed 3/11 >  Zosyn 3/9 >>  Dose adjustments this admission: n/a  Microbiology results: 3/9 BCx >> ngtd 3/9 Pleural fluid: gram positive cocci  Thank you for allowing pharmacy to be a part of this patient's care.  Vincenza Hews, PharmD, BCPS 01/13/2016, 6:26 PM Pager: [phone]

## 2016-01-13 NOTE — Progress Notes (Addendum)
INFECTIOUS DISEASE PROGRESS NOTE  ID: [name] [name] is a 55 y.o. male with  Principal Problem:   SIRS (systemic inflammatory response syndrome) (HCC) Active Problems:   Pleural effusion, left   Chest pain   Empyema, left (HCC)  Subjective: Without complaints, has had fever  Abtx:  Anti-infectives    Start     Dose/Rate Route Frequency Ordered Stop   01/12/16 2330  vancomycin (VANCOCIN) IVPB 1000 mg/200 mL premix  Status:  Discontinued     1,000 mg 200 mL/hr over 60 Minutes Intravenous Every 24 hours 01/11/16 2256 01/12/16 1519   01/12/16 0600  piperacillin-tazobactam (ZOSYN) IVPB 3.375 g     3.375 g 12.5 mL/hr over 240 Minutes Intravenous 3 times per day 01/11/16 2256     01/11/16 2300  vancomycin (VANCOCIN) IVPB 1000 mg/200 mL premix     1,000 mg 200 mL/hr over 60 Minutes Intravenous STAT 01/11/16 2254 01/12/16 0330   01/11/16 2100  vancomycin (VANCOCIN) IVPB 750 mg/150 ml premix  Status:  Discontinued     750 mg 150 mL/hr over 60 Minutes Intravenous  Once 01/11/16 2055 01/11/16 2254   01/11/16 2045  piperacillin-tazobactam (ZOSYN) IVPB 3.375 g     3.375 g 100 mL/hr over 30 Minutes Intravenous  Once 01/11/16 2044 01/12/16 0406   01/11/16 2045  vancomycin (VANCOCIN) IVPB 1000 mg/200 mL premix  Status:  Discontinued     1,000 mg 200 mL/hr over 60 Minutes Intravenous  Once 01/11/16 2044 01/11/16 2055      Medications:  Scheduled: . enoxaparin (LOVENOX) injection  20 mg Subcutaneous Q24H  . pantoprazole  40 mg Oral Daily  . piperacillin-tazobactam (ZOSYN)  IV  3.375 g Intravenous 3 times per day  . sodium chloride flush  3 mL Intravenous Q12H    Objective: Vital signs in last 24 hours: Temp:  [98.9 F (37.2 C)-102.6 F (39.2 C)] 100.3 F (37.9 C) (03/11 0508) Pulse Rate:  [89-104] 93 (03/11 0508) Resp:  [17-32] 18 (03/11 0508) BP: (109-144)/(57-76) 127/71 mmHg (03/11 0508) SpO2:  [90 %-98 %] 91 % (03/11 0508)   General appearance: alert, cooperative and no  distress Resp: diminished breath sounds base - left Cardio: regular rate and rhythm GI: normal findings: bowel sounds normal and soft, non-tender  Lab Results  Recent Labs  01/12/16 0121 01/12/16 0303 01/13/16 0256  WBC 5.7  --  4.7  HGB 11.4*  --  12.3*  HCT 37.3*  --  41.5  NA  --  137 135  K  --  4.5 5.2*  CL  --  97* 94*  CO2  --  29 36*  BUN  --  6 7  CREATININE 1.11 1.06 1.18   Liver Panel  Recent Labs  01/12/16 0303 01/13/16 0256  PROT 7.7 8.3*  ALBUMIN 2.6* 2.5*  AST 26 28  ALT 17 17  ALKPHOS 97 86  BILITOT 0.4 0.1*   Sedimentation Rate No results for input(s): ESRSEDRATE in the last 72 hours. C-Reactive Protein No results for input(s): CRP in the last 72 hours.  Microbiology: Recent Results (from the past 240 hour(s))  Culture, blood (x 2)     Status: None (Preliminary result)   Collection Time: 01/11/16  9:40 PM  Result Value Ref Range Status   Specimen Description BLOOD RIGHT ANTECUBITAL  Final   Special Requests BOTTLES DRAWN AEROBIC AND ANAEROBIC 10CC EACH  Final   Culture NO GROWTH < 24 HOURS  Final   Report Status PENDING  Incomplete  Culture, blood (x 2)     Status: None (Preliminary result)   Collection Time: 01/11/16  9:40 PM  Result Value Ref Range Status   Specimen Description BLOOD LEFT ARM  Final   Special Requests BOTTLES DRAWN AEROBIC AND ANAEROBIC 5CC EACH  Final   Culture NO GROWTH < 24 HOURS  Final   Report Status PENDING  Incomplete  Culture, body fluid-bottle     Status: None (Preliminary result)   Collection Time: 01/12/16  1:48 PM  Result Value Ref Range Status   Specimen Description FLUID LEFT PLEURAL  Final   Special Requests BOTTLES DRAWN AEROBIC AND ANAEROBIC 5CC  Final   Culture PENDING  Incomplete   Report Status PENDING  Incomplete  Gram stain     Status: None   Collection Time: 01/12/16  1:48 PM  Result Value Ref Range Status   Specimen Description FLUID LEFT PLEURAL  Final   Special Requests NONE  Final   Gram  Stain   Final    ABUNDANT WBC PRESENT,BOTH PMN AND MONONUCLEAR NO ORGANISMS SEEN    Report Status 01/12/2016 FINAL  Final    Studies/Results: Dg Chest 1 View  01/12/2016  CLINICAL DATA:  Patient status post catheter placement within the left chest. EXAM: CHEST 1 VIEW COMPARISON:  Chest radiograph 01/11/2016 FINDINGS: Stable cardiac and mediastinal contours with marked enlargement of the main pulmonary artery. Unchanged scattered bilateral heterogeneous pulmonary opacities, likely scarring. Interval placement of left pleural drainage catheter into a loculated left hydropneumothorax. Regional skeleton is unremarkable. IMPRESSION: Interval placement left pigtail drainage catheter into loculated left hydropneumothorax. Electronically Signed   By: Lovey Newcomer M.D.   On: 01/12/2016 14:23   Dg Chest Port 1 View  01/11/2016  CLINICAL DATA:  Sepsis EXAM: PORTABLE CHEST 1 VIEW COMPARISON:  CT chest 12/14/2015.  Chest x-ray 11/23/2015 FINDINGS: Left pleural loculated fluid and pleural scarring unchanged from the prior study. Prior CT demonstrated air-fluid level consistent with hydro pneumothorax. This is difficult to see on the chest x-ray but probably remains. 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 Underlying chronic lung disease with COPD Marked pulmonary artery enlargement with pulmonary artery hypertension again noted. IMPRESSION: Loculated left pleural effusion and pleural scarring unchanged with probable hydro pneumothorax on the left as seen on the prior CT of 12/14/2015. Severe pulmonary artery hypertension. No new findings compared with earlier studies. Electronically Signed   By: Franchot Gallo M.D.   On: 01/11/2016 21:18   Ct Perc Pleural Drain W/indwell Cath W/img Guide  01/12/2016  INDICATION: 55 year old male with concern for recurrent left-sided empyema. CT-guided drainage is requested. EXAM: CT PERC PLEURAL DRAIN W/INDWELL CATH W/IMG GUIDE MEDICATIONS: The patient is currently  admitted to the hospital and receiving intravenous antibiotics. The antibiotics were administered within an appropriate time frame prior to the initiation of the procedure. ANESTHESIA/SEDATION: Fentanyl  mcg IV; Versed  mg IV Moderate Sedation Time: The patient was continuously monitored during the procedure by the interventional radiology nurse under my direct supervision. COMPLICATIONS: None immediate. Estimated blood loss: 0 PROCEDURE: Informed written consent was obtained from the patient after a thorough discussion of the procedural risks, benefits and alternatives. All questions were addressed. A timeout was performed prior to the initiation of the procedure. A planning axial CT scan was performed. A thick-walled loculated hydro pneumothorax is identified on the left. A suitable skin entry site was selected and marked. The region was sterilely prepped and draped in standard fashion using chlorhexidine skin prep. Local anesthesia was  attained by infiltration with 1% lidocaine. A small dermatotomy was placed. Under intermittent CT guidance an 18 gauge trocar needle was advanced through the rib space and into the empyema. An Amplatz wire was then advanced through the trocar needle and directed toward the lung apex. The needle was removed. The skin tract was dilated to 80 [place] and a Cook 14 [place] all-purpose drainage catheter modified with multiple additional sideholes was advanced over the wire. The drain was connected to low wall suction via a pleura vac. Repeat CT imaging demonstrates excellent placement of the drain. All additional sideholes are within the pleural space and spanned the entirety of the empyema. Aspiration yields approximately 30 mL turbid bloody fluid. This was sent to the lab for culture. The tube was secured to the skin with 0 Prolene suture. An air tight bandage was applied. The patient tolerated the procedure well. IMPRESSION: Successful placement of a 68 French all-purpose drainage  catheter modified with additional sideholes into the thick walled left-sided hydro pneumothorax. The aspirated material appears to be bloody. Signed, Criselda Peaches, MD [facility] Electronically Signed   By: Jacqulynn Cadet M.D.   On: 01/12/2016 14:32     Assessment/Plan: Recurrent Pleural Effusions (current 94k WBC, 96% N, LDH 9,088) Fever  Temps better, WBC normal.  Await his repeat Cx from yesterday.  Check ANCA, RF (prev ANA-)  He has been screened (quantiferon) twice for tb, both times (-).  As well, his fluid/tissue has been sent for Tb twice (09-2015 and 04-2011) He had sample sent to [facility] 09-2015 that did not show PCR for any pathogens.  His prev Bx 09-2015 showed chronic inflammation Suspect he has auto-immune process? Certainly he could get super-infected from his multiple taps.  Total days of antibiotics: 3 zosyn         Bobby Rumpf Infectious Diseases (pager) [phone] [email or web address] 01/13/2016, 10:34 AM  LOS: 2 days

## 2016-01-14 ENCOUNTER — Inpatient Hospital Stay (HOSPITAL_COMMUNITY): Payer: BLUE CROSS/BLUE SHIELD

## 2016-01-14 DIAGNOSIS — A419 Sepsis, unspecified organism: Secondary | ICD-10-CM | POA: Insufficient documentation

## 2016-01-14 DIAGNOSIS — I340 Nonrheumatic mitral (valve) insufficiency: Secondary | ICD-10-CM

## 2016-01-14 DIAGNOSIS — B957 Other staphylococcus as the cause of diseases classified elsewhere: Secondary | ICD-10-CM

## 2016-01-14 LAB — ECHOCARDIOGRAM COMPLETE
Height: 60 in
WEIGHTICAEL: 1465.6 [oz_av]

## 2016-01-14 LAB — COMPREHENSIVE METABOLIC PANEL
ALT: 15 U/L — AB (ref 17–63)
AST: 26 U/L (ref 15–41)
Albumin: 2.3 g/dL — ABNORMAL LOW (ref 3.5–5.0)
Alkaline Phosphatase: 66 U/L (ref 38–126)
Anion gap: 9 (ref 5–15)
BUN: 8 mg/dL (ref 6–20)
CHLORIDE: 90 mmol/L — AB (ref 101–111)
CO2: 37 mmol/L — ABNORMAL HIGH (ref 22–32)
Calcium: 7.9 mg/dL — ABNORMAL LOW (ref 8.9–10.3)
Creatinine, Ser: 1.07 mg/dL (ref 0.61–1.24)
GLUCOSE: 109 mg/dL — AB (ref 65–99)
POTASSIUM: 3.8 mmol/L (ref 3.5–5.1)
SODIUM: 136 mmol/L (ref 135–145)
Total Bilirubin: 0.3 mg/dL (ref 0.3–1.2)
Total Protein: 6.9 g/dL (ref 6.5–8.1)

## 2016-01-14 LAB — RHEUMATOID FACTOR

## 2016-01-14 MED ORDER — VANCOMYCIN HCL IN DEXTROSE 1-5 GM/200ML-% IV SOLN
1000.0000 mg | INTRAVENOUS | Status: DC
  Administered 2016-01-14 – 2016-01-16 (×3): 1000 mg via INTRAVENOUS
  Filled 2016-01-14 (×4): qty 200

## 2016-01-14 NOTE — Progress Notes (Signed)
PATIENT DETAILS Name: [name] [name] Age: 55 y.o. Sex: male Date of Birth: 06/25/1961 Admit Date: 01/11/2016 Admitting Physician Geradine Girt, DO PCP:No primary care provider on file.  Subjective: No major complaints-intermittent fever continues, some mild shortness of breath.  Assessment/Plan: Principal Problem: SIRS (systemic inflammatory response syndrome): SIRS slowly resolving-but continues to be intermittently febrile. Secondary to recurrent left-sided empyema. Blood cultures negative, pleural fluid cultures-preliminary gram-positive cocci in clusters-restarted vancomycin, continue Zosyn.   Active Problems: Recurrent left-sided empyema: Seen by cardiothoracic surgery, underwent CT-guided chest tube placement on 3/10. Pleural fluid cultures growing gram-positive cocci in clusters. Continue vancomycin and Zosyn. Infectious disease and thoracic surgery following.   Hyperkalemia: Resolved. Follow electrolytes periodically  Pulmonary hypertension: Await echocardiogram-follow.  Disposition: Remain inpatient  Antimicrobial agents  See below  Anti-infectives    Start     Dose/Rate Route Frequency Ordered Stop   01/14/16 0800  vancomycin (VANCOCIN) IVPB 1000 mg/200 mL premix     1,000 mg 200 mL/hr over 60 Minutes Intravenous Every 24 hours 01/14/16 0723     01/13/16 1830  vancomycin (VANCOCIN) IVPB 750 mg/150 ml premix  Status:  Discontinued     750 mg 150 mL/hr over 60 Minutes Intravenous  Once 01/13/16 1820 01/13/16 1821   01/13/16 1830  vancomycin (VANCOCIN) IVPB 1000 mg/200 mL premix  Status:  Discontinued     1,000 mg 200 mL/hr over 60 Minutes Intravenous Every 24 hours 01/13/16 1822 01/14/16 0723   01/12/16 2330  vancomycin (VANCOCIN) IVPB 1000 mg/200 mL premix  Status:  Discontinued     1,000 mg 200 mL/hr over 60 Minutes Intravenous Every 24 hours 01/11/16 2256 01/12/16 1519   01/12/16 0600  piperacillin-tazobactam (ZOSYN) IVPB 3.375 g     3.375 g 12.5  mL/hr over 240 Minutes Intravenous 3 times per day 01/11/16 2256     01/11/16 2300  vancomycin (VANCOCIN) IVPB 1000 mg/200 mL premix     1,000 mg 200 mL/hr over 60 Minutes Intravenous STAT 01/11/16 2254 01/12/16 0330   01/11/16 2100  vancomycin (VANCOCIN) IVPB 750 mg/150 ml premix  Status:  Discontinued     750 mg 150 mL/hr over 60 Minutes Intravenous  Once 01/11/16 2055 01/11/16 2254   01/11/16 2045  piperacillin-tazobactam (ZOSYN) IVPB 3.375 g     3.375 g 100 mL/hr over 30 Minutes Intravenous  Once 01/11/16 2044 01/12/16 0406   01/11/16 2045  vancomycin (VANCOCIN) IVPB 1000 mg/200 mL premix  Status:  Discontinued     1,000 mg 200 mL/hr over 60 Minutes Intravenous  Once 01/11/16 2044 01/11/16 2055      DVT Prophylaxis: Prophylactic Lovenox   Code Status: Full code   Family Communication None at bedside  Procedures: None  CONSULTS:  ID and Cardiothoracic surgery  Time spent 20 minutes-Greater than 50% of this time was spent in counseling, explanation of diagnosis, planning of further management, and coordination of care.  MEDICATIONS: Scheduled Meds: . enoxaparin (LOVENOX) injection  20 mg Subcutaneous Q24H  . pantoprazole  40 mg Oral Daily  . piperacillin-tazobactam (ZOSYN)  IV  3.375 g Intravenous 3 times per day  . sodium chloride flush  3 mL Intravenous Q12H  . vancomycin  1,000 mg Intravenous Q24H   Continuous Infusions:  PRN Meds:.acetaminophen **OR** acetaminophen, benzonatate, HYDROcodone-acetaminophen, ipratropium-albuterol, ondansetron **OR** ondansetron (ZOFRAN) IV    PHYSICAL EXAM: Vital signs in last 24 hours: Filed Vitals:   01/13/16 1300 01/13/16 2018  01/13/16 2341 01/14/16 0523  BP: 112/60 118/72  111/64  Pulse: 92 90  80  Temp: 98.4 F (36.9 C) 101.4 F (38.6 C) 97.7 F (36.5 C) 98 F (36.7 C)  TempSrc: Oral Oral Oral Oral  Resp: 20 20  20   SpO2: 90% 98%  98%    Weight change:  There were no vitals filed for this visit. There is no  weight on file to calculate BMI.   Gen Exam: Awake and alert with clear speech.  Neck: Supple, No JVD.   Chest: B/L Clear.  Chest tube in place. CVS: S1 S2 Regular, no murmurs.  Abdomen: soft, BS +, non tender, non distended.  Extremities: no edema, lower extremities warm to touch. Neurologic: Non Focal.   Skin: No Rash.   Wounds: N/A.    Intake/Output from previous day:  Intake/Output Summary (Last 24 hours) at 01/14/16 1138 Last data filed at 01/13/16 1900  Gross per 24 hour  Intake      0 ml  Output    300 ml  Net   -300 ml     LAB RESULTS: CBC  Recent Labs Lab 01/11/16 2135 01/12/16 0121 01/13/16 0256  WBC 6.9 5.7 4.7  HGB 12.8* 11.4* 12.3*  HCT 40.2 37.3* 41.5  PLT 308 323 285  MCV 85.2 84.8 87.4  MCH 27.1 25.9* 25.9*  MCHC 31.8 30.6 29.6*  RDW 15.1 14.8 15.0  LYMPHSABS 0.8 0.7 1.5  MONOABS 0.7 0.8 0.5  EOSABS 0.1 0.1 0.0  BASOSABS 0.0 0.0 0.0    Chemistries   Recent Labs Lab 01/11/16 2135 01/12/16 0121 01/12/16 0303 01/13/16 0256 01/14/16 0610  NA 135  --  137 135 136  K 4.3  --  4.5 5.2* 3.8  CL 96*  --  97* 94* 90*  CO2 33*  --  29 36* 37*  GLUCOSE 106*  --  106* 120* 109*  BUN <5*  --  6 7 8   CREATININE 1.08 1.11 1.06 1.18 1.07  CALCIUM 8.5*  --  8.2* 8.1* 7.9*  MG 2.0  --   --   --   --     CBG: No results for input(s): GLUCAP in the last 168 hours.  GFR Estimated Creatinine Clearance: 46.9 mL/min (by C-G formula based on Cr of 1.07).  Coagulation profile  Recent Labs Lab 01/11/16 2135  INR 1.12    Cardiac Enzymes No results for input(s): CKMB, TROPONINI, MYOGLOBIN in the last 168 hours.  Invalid input(s): CK  Invalid input(s): POCBNP No results for input(s): DDIMER in the last 72 hours. No results for input(s): HGBA1C in the last 72 hours. No results for input(s): CHOL, HDL, LDLCALC, TRIG, CHOLHDL, LDLDIRECT in the last 72 hours. No results for input(s): TSH, T4TOTAL, T3FREE, THYROIDAB in the last 72 hours.  Invalid  input(s): FREET3 No results for input(s): VITAMINB12, FOLATE, FERRITIN, TIBC, IRON, RETICCTPCT in the last 72 hours. No results for input(s): LIPASE, AMYLASE in the last 72 hours.  Urine Studies No results for input(s): UHGB, CRYS in the last 72 hours.  Invalid input(s): UACOL, UAPR, USPG, UPH, UTP, UGL, UKET, UBIL, UNIT, UROB, ULEU, UEPI, UWBC, URBC, UBAC, CAST, UCOM, BILUA  MICROBIOLOGY: Recent Results (from the past 240 hour(s))  Culture, blood (x 2)     Status: None (Preliminary result)   Collection Time: 01/11/16  9:40 PM  Result Value Ref Range Status   Specimen Description BLOOD RIGHT ANTECUBITAL  Final   Special Requests BOTTLES DRAWN AEROBIC AND  ANAEROBIC 10CC   Final   Culture NO GROWTH 3 DAYS  Final   Report Status PENDING  Incomplete  Culture, blood (x 2)     Status: None (Preliminary result)   Collection Time: 01/11/16  9:40 PM  Result Value Ref Range Status   Specimen Description BLOOD LEFT ARM  Final   Special Requests BOTTLES DRAWN AEROBIC AND ANAEROBIC 5CC  Final   Culture NO GROWTH 3 DAYS  Final   Report Status PENDING  Incomplete  Culture, body fluid-bottle     Status: None (Preliminary result)   Collection Time: 01/12/16  1:48 PM  Result Value Ref Range Status   Specimen Description FLUID LEFT PLEURAL  Final   Special Requests BOTTLES DRAWN AEROBIC AND ANAEROBIC 5CC  Final   Gram Stain   Final    GRAM POSITIVE COCCI IN CLUSTERS IN BOTH AEROBIC AND ANAEROBIC BOTTLES CRITICAL RESULT CALLED TO, READ BACK BY AND VERIFIED WITH: A BURTON 01/13/16 @ 4 M VESTAL    Culture   Final    STAPHYLOCOCCUS SPECIES (COAGULASE NEGATIVE) SUSCEPTIBILITIES TO FOLLOW    Report Status PENDING  Incomplete  Gram stain     Status: None   Collection Time: 01/12/16  1:48 PM  Result Value Ref Range Status   Specimen Description FLUID LEFT PLEURAL  Final   Special Requests NONE  Final   Gram Stain   Final    ABUNDANT WBC PRESENT,BOTH PMN AND MONONUCLEAR NO ORGANISMS SEEN     Report Status 01/12/2016 FINAL  Final    RADIOLOGY STUDIES/RESULTS: Dg Chest 1 View  01/12/2016  CLINICAL DATA:  Patient status post catheter placement within the left chest. EXAM: CHEST 1 VIEW COMPARISON:  Chest radiograph 01/11/2016 FINDINGS: Stable cardiac and mediastinal contours with marked enlargement of the main pulmonary artery. Unchanged scattered bilateral heterogeneous pulmonary opacities, likely scarring. Interval placement of left pleural drainage catheter into a loculated left hydropneumothorax. Regional skeleton is unremarkable. IMPRESSION: Interval placement left pigtail drainage catheter into loculated left hydropneumothorax. Electronically Signed   By: Lovey Newcomer M.D.   On: 01/12/2016 14:23   Dg Chest Port 1 View  01/11/2016  CLINICAL DATA:  Sepsis EXAM: PORTABLE CHEST 1 VIEW COMPARISON:  CT chest 12/14/2015.  Chest x-ray 11/23/2015 FINDINGS: Left pleural loculated fluid and pleural scarring unchanged from the prior study. Prior CT demonstrated air-fluid level consistent with hydro pneumothorax. This is difficult to see on the chest x-ray but probably remains. 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 Underlying chronic lung disease with COPD Marked pulmonary artery enlargement with pulmonary artery hypertension again noted. IMPRESSION: Loculated left pleural effusion and pleural scarring unchanged with probable hydro pneumothorax on the left as seen on the prior CT of 12/14/2015. Severe pulmonary artery hypertension. No new findings compared with earlier studies. Electronically Signed   By: Franchot Gallo M.D.   On: 01/11/2016 21:18   Ct Perc Pleural Drain W/indwell Cath W/img Guide  01/12/2016  INDICATION: 55 year old male with concern for recurrent left-sided empyema. CT-guided drainage is requested. EXAM: CT PERC PLEURAL DRAIN W/INDWELL CATH W/IMG GUIDE MEDICATIONS: The patient is currently admitted to the hospital and receiving intravenous antibiotics. The antibiotics were  administered within an appropriate time frame prior to the initiation of the procedure. ANESTHESIA/SEDATION: Fentanyl  mcg IV; Versed  mg IV Moderate Sedation Time: The patient was continuously monitored during the procedure by the interventional radiology nurse under my direct supervision. COMPLICATIONS: None immediate. Estimated blood loss: 0 PROCEDURE: Informed written consent was obtained from  the patient after a thorough discussion of the procedural risks, benefits and alternatives. All questions were addressed. A timeout was performed prior to the initiation of the procedure. A planning axial CT scan was performed. A thick-walled loculated hydro pneumothorax is identified on the left. A suitable skin entry site was selected and marked. The region was sterilely prepped and draped in standard fashion using chlorhexidine skin prep. Local anesthesia was attained by infiltration with 1% lidocaine. A small dermatotomy was placed. Under intermittent CT guidance an 18 gauge trocar needle was advanced through the rib space and into the empyema. An Amplatz wire was then advanced through the trocar needle and directed toward the lung apex. The needle was removed. The skin tract was dilated to 77 [place] and a Cook 14 [place] all-purpose drainage catheter modified with multiple additional sideholes was advanced over the wire. The drain was connected to low wall suction via a pleura vac. Repeat CT imaging demonstrates excellent placement of the drain. All additional sideholes are within the pleural space and spanned the entirety of the empyema. Aspiration yields approximately 30 mL turbid bloody fluid. This was sent to the lab for culture. The tube was secured to the skin with 0 Prolene suture. An air tight bandage was applied. The patient tolerated the procedure well. IMPRESSION: Successful placement of a 46 French all-purpose drainage catheter modified with additional sideholes into the thick walled left-sided hydro  pneumothorax. The aspirated material appears to be bloody. Signed, Criselda Peaches, MD [facility] Radiology Electronically Signed   By: Jacqulynn Cadet M.D.   On: 01/12/2016 14:32    Oren Binet, MD  [facility] Pager:[phone]  If 7PM-7AM, please contact night-coverage [email or web address] Password [facility] 01/14/2016, 11:38 AM   LOS: 3 days

## 2016-01-14 NOTE — Progress Notes (Signed)
  Echocardiogram 2D Echocardiogram has been performed.  Noemi Bellissimo 01/14/2016, 11:11 AM

## 2016-01-14 NOTE — Progress Notes (Addendum)
[address]       [place] [address]             [phone]          Subjective: Less hemoptysis, some SOB  Objective: Vital signs in last 24 hours: Temp:  [97.7 F (36.5 C)-101.4 F (38.6 C)] 98 F (36.7 C) (03/12 0523) Pulse Rate:  [80-92] 80 (03/12 0523) Cardiac Rhythm:  [-] Normal sinus rhythm (03/12 0744) Resp:  [20] 20 (03/12 0523) BP: (111-118)/(60-72) 111/64 mmHg (03/12 0523) SpO2:  [90 %-98 %] 98 % (03/12 0523)  Hemodynamic parameters for last 24 hours:    Intake/Output from previous day: 03/11 0701 - 03/12 0700 In: -  Out: 425 [Urine:425] Intake/Output this shift:    General appearance: alert, cooperative, fatigued and no distress Heart: regular rate and rhythm Lungs: + coarse BS, dim in lower fields Abdomen: benign Extremities: no edema  Lab Results:  Recent Labs  01/12/16 0121 01/13/16 0256  WBC 5.7 4.7  HGB 11.4* 12.3*  HCT 37.3* 41.5  PLT 323 285   BMET:  Recent Labs  01/13/16 0256 01/14/16 0610  NA 135 136  K 5.2* 3.8  CL 94* 90*  CO2 36* 37*  GLUCOSE 120* 109*  BUN 7 8  CREATININE 1.18 1.07  CALCIUM 8.1* 7.9*    PT/INR:  Recent Labs  01/11/16 2135  LABPROT 14.6  INR 1.12   ABG    Component Value Date/Time   PHART 7.331* 10/03/2015 0317   HCO3 31.4* 10/03/2015 0317   TCO2 33.2 10/03/2015 0317   O2SAT 99.3 10/03/2015 0317   CBG (last 3)  No results for input(s): GLUCAP in the last 72 hours.  Results for orders placed or performed during the hospital encounter of 01/11/16  Culture, blood (x 2)     Status: None (Preliminary result)   Collection Time: 01/11/16  9:40 PM  Result Value Ref Range Status   Specimen Description BLOOD RIGHT ANTECUBITAL  Final   Special Requests BOTTLES DRAWN AEROBIC AND ANAEROBIC 10CC   Final   Culture NO GROWTH 2 DAYS  Final   Report Status PENDING  Incomplete  Culture, blood (x 2)     Status: None (Preliminary result)   Collection Time: 01/11/16  9:40 PM    Result Value Ref Range Status   Specimen Description BLOOD LEFT ARM  Final   Special Requests BOTTLES DRAWN AEROBIC AND ANAEROBIC 5CC  Final   Culture NO GROWTH 2 DAYS  Final   Report Status PENDING  Incomplete  Culture, body fluid-bottle     Status: None (Preliminary result)   Collection Time: 01/12/16  1:48 PM  Result Value Ref Range Status   Specimen Description FLUID LEFT PLEURAL  Final   Special Requests BOTTLES DRAWN AEROBIC AND ANAEROBIC 5CC  Final   Gram Stain   Final    GRAM POSITIVE COCCI IN CLUSTERS IN BOTH AEROBIC AND ANAEROBIC BOTTLES CRITICAL RESULT CALLED TO, READ BACK BY AND VERIFIED WITH: A BURTON 01/13/16 @ 37 M VESTAL    Culture   Final    STAPHYLOCOCCUS SPECIES (COAGULASE NEGATIVE) SUSCEPTIBILITIES TO FOLLOW    Report Status PENDING  Incomplete  Gram stain     Status: None   Collection Time: 01/12/16  1:48 PM  Result Value Ref Range Status   Specimen Description FLUID LEFT PLEURAL  Final   Special Requests NONE  Final   Gram Stain   Final    ABUNDANT  WBC PRESENT,BOTH PMN AND MONONUCLEAR NO ORGANISMS SEEN    Report Status 01/12/2016 FINAL  Final     Meds Scheduled Meds: . enoxaparin (LOVENOX) injection  20 mg Subcutaneous Q24H  . pantoprazole  40 mg Oral Daily  . piperacillin-tazobactam (ZOSYN)  IV  3.375 g Intravenous 3 times per day  . sodium chloride flush  3 mL Intravenous Q12H  . vancomycin  1,000 mg Intravenous Q24H   Continuous Infusions:  PRN Meds:.acetaminophen **OR** acetaminophen, benzonatate, HYDROcodone-acetaminophen, ipratropium-albuterol, ondansetron **OR** ondansetron (ZOFRAN) IV  Xrays Dg Chest 1 View  01/12/2016  CLINICAL DATA:  Patient status post catheter placement within the left chest. EXAM: CHEST 1 VIEW COMPARISON:  Chest radiograph 01/11/2016 FINDINGS: Stable cardiac and mediastinal contours with marked enlargement of the main pulmonary artery. Unchanged scattered bilateral heterogeneous pulmonary opacities, likely scarring.  Interval placement of left pleural drainage catheter into a loculated left hydropneumothorax. Regional skeleton is unremarkable. IMPRESSION: Interval placement left pigtail drainage catheter into loculated left hydropneumothorax. Electronically Signed   By: Lovey Newcomer M.D.   On: 01/12/2016 14:23   Ct Perc Pleural Drain W/[place] Cath W/img Guide  01/12/2016  INDICATION: 55 year old male with concern for recurrent left-sided empyema. CT-guided drainage is requested. EXAM: CT PERC PLEURAL DRAIN W/[facility] CATH W/IMG GUIDE MEDICATIONS: The patient is currently admitted to the hospital and receiving intravenous antibiotics. The antibiotics were administered within an appropriate time frame prior to the initiation of the procedure. ANESTHESIA/SEDATION: Fentanyl  mcg IV; Versed  mg IV Moderate Sedation Time: The patient was continuously monitored during the procedure by the interventional radiology nurse under my direct supervision. COMPLICATIONS: None immediate. Estimated blood loss: 0 PROCEDURE: Informed written consent was obtained from the patient after a thorough discussion of the procedural risks, benefits and alternatives. All questions were addressed. A timeout was performed prior to the initiation of the procedure. A planning axial CT scan was performed. A thick-walled loculated hydro pneumothorax is identified on the left. A suitable skin entry site was selected and marked. The region was sterilely prepped and draped in standard fashion using chlorhexidine skin prep. Local anesthesia was attained by infiltration with 1% lidocaine. A small dermatotomy was placed. Under intermittent CT guidance an 18 gauge trocar needle was advanced through the rib space and into the empyema. An Amplatz wire was then advanced through the trocar needle and directed toward the lung apex. The needle was removed. The skin tract was dilated to 54 [place] and a Cook 14 [place] all-purpose drainage catheter modified with multiple  additional sideholes was advanced over the wire. The drain was connected to low wall suction via a pleura vac. Repeat CT imaging demonstrates excellent placement of the drain. All additional sideholes are within the pleural space and spanned the entirety of the empyema. Aspiration yields approximately 30 mL turbid bloody fluid. This was sent to the lab for culture. The tube was secured to the skin with 0 Prolene suture. An air tight bandage was applied. The patient tolerated the procedure well. IMPRESSION: Successful placement of a 74 French all-purpose drainage catheter modified with additional sideholes into the thick walled left-sided hydro pneumothorax. The aspirated material appears to be bloody. Signed, Criselda Peaches, MD [facility] Electronically Signed   By: Jacqulynn Cadet M.D.   On: 01/12/2016 14:32    Assessment/Plan:  1 stable on current rx 2 echo pending    LOS: 3 days    [name] [name],[name] [name] 01/14/2016  Feels better Echo pending Recent Results (  from the past 240 hour(s))  Culture, blood (x 2)     Status: None (Preliminary result)   Collection Time: 01/11/16  9:40 PM  Result Value Ref Range Status   Specimen Description BLOOD RIGHT ANTECUBITAL  Final   Special Requests BOTTLES DRAWN AEROBIC AND ANAEROBIC 10CC   Final   Culture NO GROWTH 3 DAYS  Final   Report Status PENDING  Incomplete  Culture, blood (x 2)     Status: None (Preliminary result)   Collection Time: 01/11/16  9:40 PM  Result Value Ref Range Status   Specimen Description BLOOD LEFT ARM  Final   Special Requests BOTTLES DRAWN AEROBIC AND ANAEROBIC 5CC  Final   Culture NO GROWTH 3 DAYS  Final   Report Status PENDING  Incomplete  Culture, body fluid-bottle     Status: None (Preliminary result)   Collection Time: 01/12/16  1:48 PM  Result Value Ref Range Status   Specimen Description FLUID LEFT PLEURAL  Final   Special Requests BOTTLES DRAWN AEROBIC  AND ANAEROBIC 5CC  Final   Gram Stain   Final    GRAM POSITIVE COCCI IN CLUSTERS IN BOTH AEROBIC AND ANAEROBIC BOTTLES CRITICAL RESULT CALLED TO, READ BACK BY AND VERIFIED WITH: A BURTON 01/13/16 @ 59 M VESTAL    Culture   Final    STAPHYLOCOCCUS SPECIES (COAGULASE NEGATIVE) SUSCEPTIBILITIES TO FOLLOW    Report Status PENDING  Incomplete  Gram stain     Status: None   Collection Time: 01/12/16  1:48 PM  Result Value Ref Range Status   Specimen Description FLUID LEFT PLEURAL  Final   Special Requests NONE  Final   Gram Stain   Final    ABUNDANT WBC PRESENT,BOTH PMN AND MONONUCLEAR NO ORGANISMS SEEN    Report Status 01/12/2016 FINAL  Final    I have seen and examined [name] [name] and agree with the above assessment  and plan.  [name] MD Beeper [phone] Office [phone] 01/14/2016 12:07 PM

## 2016-01-14 NOTE — Progress Notes (Signed)
INFECTIOUS DISEASE PROGRESS NOTE  ID: [name] [name] is a 55 y.o. male with  Principal Problem:   SIRS (systemic inflammatory response syndrome) (HCC) Active Problems:   Pleural effusion, left   Chest pain   Empyema, left (HCC)   Pulmonary hypertension (HCC)   Hyperkalemia  Subjective: C/o feeling that sputum getting stuck in throat.   Abtx:  Anti-infectives    Start     Dose/Rate Route Frequency Ordered Stop   01/14/16 0800  vancomycin (VANCOCIN) IVPB 1000 mg/200 mL premix     1,000 mg 200 mL/hr over 60 Minutes Intravenous Every 24 hours 01/14/16 0723     01/13/16 1830  vancomycin (VANCOCIN) IVPB 750 mg/150 ml premix  Status:  Discontinued     750 mg 150 mL/hr over 60 Minutes Intravenous  Once 01/13/16 1820 01/13/16 1821   01/13/16 1830  vancomycin (VANCOCIN) IVPB 1000 mg/200 mL premix  Status:  Discontinued     1,000 mg 200 mL/hr over 60 Minutes Intravenous Every 24 hours 01/13/16 1822 01/14/16 0723   01/12/16 2330  vancomycin (VANCOCIN) IVPB 1000 mg/200 mL premix  Status:  Discontinued     1,000 mg 200 mL/hr over 60 Minutes Intravenous Every 24 hours 01/11/16 2256 01/12/16 1519   01/12/16 0600  piperacillin-tazobactam (ZOSYN) IVPB 3.375 g     3.375 g 12.5 mL/hr over 240 Minutes Intravenous 3 times per day 01/11/16 2256     01/11/16 2300  vancomycin (VANCOCIN) IVPB 1000 mg/200 mL premix     1,000 mg 200 mL/hr over 60 Minutes Intravenous STAT 01/11/16 2254 01/12/16 0330   01/11/16 2100  vancomycin (VANCOCIN) IVPB 750 mg/150 ml premix  Status:  Discontinued     750 mg 150 mL/hr over 60 Minutes Intravenous  Once 01/11/16 2055 01/11/16 2254   01/11/16 2045  piperacillin-tazobactam (ZOSYN) IVPB 3.375 g     3.375 g 100 mL/hr over 30 Minutes Intravenous  Once 01/11/16 2044 01/12/16 0406   01/11/16 2045  vancomycin (VANCOCIN) IVPB 1000 mg/200 mL premix  Status:  Discontinued     1,000 mg 200 mL/hr over 60 Minutes Intravenous  Once 01/11/16 2044 01/11/16 2055      Medications:    Scheduled: . enoxaparin (LOVENOX) injection  20 mg Subcutaneous Q24H  . pantoprazole  40 mg Oral Daily  . piperacillin-tazobactam (ZOSYN)  IV  3.375 g Intravenous 3 times per day  . sodium chloride flush  3 mL Intravenous Q12H  . vancomycin  1,000 mg Intravenous Q24H    Objective: Vital signs in last 24 hours: Temp:  [97.7 F (36.5 C)-101.4 F (38.6 C)] 99.5 F (37.5 C) (03/12 1345) Pulse Rate:  [80-90] 80 (03/12 1345) Resp:  [18-20] 18 (03/12 1345) BP: (111-118)/(53-72) 111/53 mmHg (03/12 1345) SpO2:  [97 %-98 %] 97 % (03/12 1345)   General appearance: alert, cooperative, fatigued and no distress Resp: diminished breath sounds bilaterally Cardio: regular rate and rhythm GI: normal findings: bowel sounds normal and soft, non-tender  Lab Results  Recent Labs  01/12/16 0121  01/13/16 0256 01/14/16 0610  WBC 5.7  --  4.7  --   HGB 11.4*  --  12.3*  --   HCT 37.3*  --  41.5  --   NA  --   < > 135 136  K  --   < > 5.2* 3.8  CL  --   < > 94* 90*  CO2  --   < > 36* 37*  BUN  --   < >  7 8  CREATININE 1.11  < > 1.18 1.07  < > = values in this interval not displayed. Liver Panel  Recent Labs  01/13/16 0256 01/14/16 0610  PROT 8.3* 6.9  ALBUMIN 2.5* 2.3*  AST 28 26  ALT 17 15*  ALKPHOS 86 66  BILITOT 0.1* 0.3   Sedimentation Rate No results for input(s): ESRSEDRATE in the last 72 hours. C-Reactive Protein No results for input(s): CRP in the last 72 hours.  Microbiology: Recent Results (from the past 240 hour(s))  Culture, blood (x 2)     Status: None (Preliminary result)   Collection Time: 01/11/16  9:40 PM  Result Value Ref Range Status   Specimen Description BLOOD RIGHT ANTECUBITAL  Final   Special Requests BOTTLES DRAWN AEROBIC AND ANAEROBIC 10CC   Final   Culture NO GROWTH 3 DAYS  Final   Report Status PENDING  Incomplete  Culture, blood (x 2)     Status: None (Preliminary result)   Collection Time: 01/11/16  9:40 PM  Result Value Ref Range Status    Specimen Description BLOOD LEFT ARM  Final   Special Requests BOTTLES DRAWN AEROBIC AND ANAEROBIC 5CC  Final   Culture NO GROWTH 3 DAYS  Final   Report Status PENDING  Incomplete  Culture, body fluid-bottle     Status: None (Preliminary result)   Collection Time: 01/12/16  1:48 PM  Result Value Ref Range Status   Specimen Description FLUID LEFT PLEURAL  Final   Special Requests BOTTLES DRAWN AEROBIC AND ANAEROBIC 5CC  Final   Gram Stain   Final    GRAM POSITIVE COCCI IN CLUSTERS IN BOTH AEROBIC AND ANAEROBIC BOTTLES CRITICAL RESULT CALLED TO, [name] BACK BY AND VERIFIED WITH: A BURTON 01/13/16 @ 23 M VESTAL    Culture   Final    STAPHYLOCOCCUS SPECIES (COAGULASE NEGATIVE) SUSCEPTIBILITIES TO FOLLOW    Report Status PENDING  Incomplete  Gram stain     Status: None   Collection Time: 01/12/16  1:48 PM  Result Value Ref Range Status   Specimen Description FLUID LEFT PLEURAL  Final   Special Requests NONE  Final   Gram Stain   Final    ABUNDANT WBC PRESENT,BOTH PMN AND MONONUCLEAR NO ORGANISMS SEEN    Report Status 01/12/2016 FINAL  Final    Studies/Results: No results found.   Assessment/Plan: Recurrent Pleural Effusions (current 94k WBC, 96% N, LDH 9,088) Fever  Fever overnight, WBC normal.  Await his repeat Cx showing CoAgNeg staph ANCA and RF pending Humidify O2 My appreciation to Dr Sloan Leiter   Total days of antibiotics: 4 zosyn, [facility] (pager) [phone] [email or web address] 01/14/2016, 2:20 PM  LOS: 3 days

## 2016-01-15 ENCOUNTER — Inpatient Hospital Stay (HOSPITAL_COMMUNITY): Payer: BLUE CROSS/BLUE SHIELD

## 2016-01-15 ENCOUNTER — Ambulatory Visit (HOSPITAL_COMMUNITY): Payer: BLUE CROSS/BLUE SHIELD

## 2016-01-15 ENCOUNTER — Encounter (HOSPITAL_COMMUNITY): Payer: Self-pay | Admitting: *Deleted

## 2016-01-15 DIAGNOSIS — R609 Edema, unspecified: Secondary | ICD-10-CM

## 2016-01-15 LAB — COMPREHENSIVE METABOLIC PANEL
ALT: 14 U/L — AB (ref 17–63)
AST: 24 U/L (ref 15–41)
Albumin: 2.3 g/dL — ABNORMAL LOW (ref 3.5–5.0)
Alkaline Phosphatase: 60 U/L (ref 38–126)
Anion gap: 8 (ref 5–15)
BILIRUBIN TOTAL: 0.2 mg/dL — AB (ref 0.3–1.2)
BUN: 6 mg/dL (ref 6–20)
CHLORIDE: 89 mmol/L — AB (ref 101–111)
CO2: 38 mmol/L — ABNORMAL HIGH (ref 22–32)
CREATININE: 1.1 mg/dL (ref 0.61–1.24)
Calcium: 8.1 mg/dL — ABNORMAL LOW (ref 8.9–10.3)
Glucose, Bld: 98 mg/dL (ref 65–99)
POTASSIUM: 3.7 mmol/L (ref 3.5–5.1)
Sodium: 135 mmol/L (ref 135–145)
TOTAL PROTEIN: 7.2 g/dL (ref 6.5–8.1)

## 2016-01-15 LAB — CULTURE, BODY FLUID-BOTTLE

## 2016-01-15 LAB — CULTURE, BODY FLUID W GRAM STAIN -BOTTLE

## 2016-01-15 LAB — MPO/PR-3 (ANCA) ANTIBODIES: Myeloperoxidase Abs: <9 U/mL (ref 0.0–9.0)

## 2016-01-15 LAB — PH, BODY FLUID: pH, Body Fluid: 6.9

## 2016-01-15 MED ORDER — TECHNETIUM TO 99M ALBUMIN AGGREGATED
4.1000 | Freq: Once | INTRAVENOUS | Status: AC | PRN
  Administered 2016-01-15: 4 via INTRAVENOUS

## 2016-01-15 MED ORDER — TECHNETIUM TC 99M DIETHYLENETRIAME-PENTAACETIC ACID: 31.1000 | Freq: Once | INTRAVENOUS | Status: DC | PRN

## 2016-01-15 NOTE — Progress Notes (Signed)
Patient ID: [name] [name], male   DOB: [birth year], 55 y.o.   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 I doubt that this is a true relapse of his recent empyema. I suspect that this is a new process by a super infecting staph. He will need at least 3 weeks of IV vancomycin. I will discontinue piperacillin tazobactam now. I will follow-up on Wednesday, 01/17/2016.         Michel Bickers, MD [facility] [phone] pager   [phone] cell 11/07/2015, 1:32 PM

## 2016-01-15 NOTE — Progress Notes (Addendum)
      [address]       [place] [address]             [phone]      Subjective:  Ms. Cowsert states he doesn't feel well.  No specific complaints.  Objective: Vital signs in last 24 hours: Temp:  [98.9 F (37.2 C)-99.5 F (37.5 C)] 98.9 F (37.2 C) (03/12 2130) Pulse Rate:  [80-82] 82 (03/12 2130) Cardiac Rhythm:  [-] Normal sinus rhythm (03/12 1900) Resp:  [18] 18 (03/12 1345) BP: (111-120)/(53-74) 120/74 mmHg (03/12 2130) SpO2:  [97 %-100 %] 100 % (03/12 2130)  General appearance: alert, cooperative and no distress Heart: regular rate and rhythm Lungs: coarse throughout Wound: clean and dry  Lab Results:  Recent Labs  01/13/16 0256  WBC 4.7  HGB 12.3*  HCT 41.5  PLT 285   BMET:  Recent Labs  01/14/16 0610 01/15/16 0235  NA 136 135  K 3.8 3.7  CL 90* 89*  CO2 37* 38*  GLUCOSE 109* 98  BUN 8 6  CREATININE 1.07 1.10  CALCIUM 7.9* 8.1*    PT/INR: No results for input(s): LABPROT, INR in the last 72 hours. ABG    Component Value Date/Time   PHART 7.331* 10/03/2015 0317   HCO3 31.4* 10/03/2015 0317   TCO2 33.2 10/03/2015 0317   O2SAT 99.3 10/03/2015 0317   CBG (last 3)  No results for input(s): GLUCAP in the last 72 hours.   Assessment/Plan:  1. Chest tube- no output recorded yesterday, + air leak with cough- continue to suction for now 2. ID- fluid cultures showing CN Staph, sensitivities pending, ID following 3. Dispo- care per primary    LOS: 4 days    BARRETT, ERIN 01/15/2016  ID has left antibiotic recommendations  I have seen and examined [name] and agree with the above assessment  and plan.  [name] MD Beeper [phone] Office [phone] 01/15/2016 7:12 PM

## 2016-01-15 NOTE — Progress Notes (Signed)
Utilization review completed.  

## 2016-01-15 NOTE — Progress Notes (Signed)
PATIENT DETAILS Name: [name] [name] Age: 55 y.o. Sex: male Date of Birth: April 25, 1961 Admit Date: 01/11/2016 Admitting Physician Geradine Girt, DO PCP:No primary care provider on file.  Subjective: Feels weak-afebrile overnight  Assessment/Plan: Principal Problem: SIRS (systemic inflammatory response syndrome): SIRS pathophysiology resolved, fever curve muchbetter.  Secondary to recurrent left-sided empyema. Blood cultures negative, pleural fluid culture positive for coag neg staph-continue empiric vancomycin and Zosyn. Await ID eval.  Active Problems: Recurrent left-sided empyema: Seen by cardiothoracic surgery, underwent CT-guided chest tube placement on 3/10. Pleural fluid cultures ositive for coag neg staph-continue empiric vancomycin and Zosyn. Await ID eval.CTVS/IR following.  Hyperkalemia: Resolved. Follow electrolytes periodically  Pulmonary hypertension: Echocardiogram confirms severe Pul HTN-PA pressure of 18mm Hg. Check ANA, ANCA serology pending, RA factor neg. Recent HIV test was negative as well. Will need folllow up with cardiology at some point.  Disposition: Remain inpatient  Antimicrobial agents  See below  Anti-infectives    Start     Dose/Rate Route Frequency Ordered Stop   01/14/16 0800  vancomycin (VANCOCIN) IVPB 1000 mg/200 mL premix     1,000 mg 200 mL/hr over 60 Minutes Intravenous Every 24 hours 01/14/16 0723     01/13/16 1830  vancomycin (VANCOCIN) IVPB 750 mg/150 ml premix  Status:  Discontinued     750 mg 150 mL/hr over 60 Minutes Intravenous  Once 01/13/16 1820 01/13/16 1821   01/13/16 1830  vancomycin (VANCOCIN) IVPB 1000 mg/200 mL premix  Status:  Discontinued     1,000 mg 200 mL/hr over 60 Minutes Intravenous Every 24 hours 01/13/16 1822 01/14/16 0723   01/12/16 2330  vancomycin (VANCOCIN) IVPB 1000 mg/200 mL premix  Status:  Discontinued     1,000 mg 200 mL/hr over 60 Minutes Intravenous Every 24 hours 01/11/16 2256 01/12/16 1519    01/12/16 0600  piperacillin-tazobactam (ZOSYN) IVPB 3.375 g     3.375 g 12.5 mL/hr over 240 Minutes Intravenous 3 times per day 01/11/16 2256     01/11/16 2300  vancomycin (VANCOCIN) IVPB 1000 mg/200 mL premix     1,000 mg 200 mL/hr over 60 Minutes Intravenous STAT 01/11/16 2254 01/12/16 0330   01/11/16 2100  vancomycin (VANCOCIN) IVPB 750 mg/150 ml premix  Status:  Discontinued     750 mg 150 mL/hr over 60 Minutes Intravenous  Once 01/11/16 2055 01/11/16 2254   01/11/16 2045  piperacillin-tazobactam (ZOSYN) IVPB 3.375 g     3.375 g 100 mL/hr over 30 Minutes Intravenous  Once 01/11/16 2044 01/12/16 0406   01/11/16 2045  vancomycin (VANCOCIN) IVPB 1000 mg/200 mL premix  Status:  Discontinued     1,000 mg 200 mL/hr over 60 Minutes Intravenous  Once 01/11/16 2044 01/11/16 2055      DVT Prophylaxis: Prophylactic Lovenox   Code Status: Full code   Family Communication None at bedside  Procedures: None  CONSULTS:  ID and Cardiothoracic surgery  Time spent 20 minutes-Greater than 50% of this time was spent in counseling, explanation of diagnosis, planning of further management, and coordination of care.  MEDICATIONS: Scheduled Meds: . enoxaparin (LOVENOX) injection  20 mg Subcutaneous Q24H  . pantoprazole  40 mg Oral Daily  . piperacillin-tazobactam (ZOSYN)  IV  3.375 g Intravenous 3 times per day  . sodium chloride flush  3 mL Intravenous Q12H  . vancomycin  1,000 mg Intravenous Q24H   Continuous Infusions:  PRN Meds:.acetaminophen **OR** acetaminophen, benzonatate, HYDROcodone-acetaminophen, ipratropium-albuterol,  ondansetron **OR** ondansetron (ZOFRAN) IV    PHYSICAL EXAM: Vital signs in last 24 hours: Filed Vitals:   01/13/16 2341 01/14/16 0523 01/14/16 1345 01/14/16 2130  BP:  111/64 111/53 120/74  Pulse:  80 80 82  Temp: 97.7 F (36.5 C) 98 F (36.7 C) 99.5 F (37.5 C) 98.9 F (37.2 C)  TempSrc: Oral Oral Oral Oral  Resp:  20 18   SpO2:  98% 97% 100%     Weight change:  There were no vitals filed for this visit. There is no weight on file to calculate BMI.   Gen Exam: Awake and alert with clear speech.  Neck: Supple, No JVD.   Chest: B/L Clear.  Chest tube in place. CVS: S1 S2 Regular, no murmurs.  Abdomen: soft, BS +, non tender, non distended.  Extremities: no edema, lower extremities warm to touch. Neurologic: Non Focal.   Skin: No Rash.   Wounds: N/A.    Intake/Output from previous day:  Intake/Output Summary (Last 24 hours) at 01/15/16 1027 Last data filed at 01/15/16 0900  Gross per 24 hour  Intake    240 ml  Output    500 ml  Net   -260 ml     LAB RESULTS: CBC  Recent Labs Lab 01/11/16 2135 01/12/16 0121 01/13/16 0256  WBC 6.9 5.7 4.7  HGB 12.8* 11.4* 12.3*  HCT 40.2 37.3* 41.5  PLT 308 323 285  MCV 85.2 84.8 87.4  MCH 27.1 25.9* 25.9*  MCHC 31.8 30.6 29.6*  RDW 15.1 14.8 15.0  LYMPHSABS 0.8 0.7 1.5  MONOABS 0.7 0.8 0.5  EOSABS 0.1 0.1 0.0  BASOSABS 0.0 0.0 0.0    Chemistries   Recent Labs Lab 01/11/16 2135 01/12/16 0121 01/12/16 0303 01/13/16 0256 01/14/16 0610 01/15/16 0235  NA 135  --  137 135 136 135  K 4.3  --  4.5 5.2* 3.8 3.7  CL 96*  --  97* 94* 90* 89*  CO2 33*  --  29 36* 37* 38*  GLUCOSE 106*  --  106* 120* 109* 98  BUN <5*  --  6 7 8 6   CREATININE 1.08 1.11 1.06 1.18 1.07 1.10  CALCIUM 8.5*  --  8.2* 8.1* 7.9* 8.1*  MG 2.0  --   --   --   --   --     CBG: No results for input(s): GLUCAP in the last 168 hours.  GFR Estimated Creatinine Clearance: 45.6 mL/min (by C-G formula based on Cr of 1.1).  Coagulation profile  Recent Labs Lab 01/11/16 2135  INR 1.12    Cardiac Enzymes No results for input(s): CKMB, TROPONINI, MYOGLOBIN in the last 168 hours.  Invalid input(s): CK  Invalid input(s): POCBNP No results for input(s): DDIMER in the last 72 hours. No results for input(s): HGBA1C in the last 72 hours. No results for input(s): CHOL, HDL, LDLCALC, TRIG,  CHOLHDL, LDLDIRECT in the last 72 hours. No results for input(s): TSH, T4TOTAL, T3FREE, THYROIDAB in the last 72 hours.  Invalid input(s): FREET3 No results for input(s): VITAMINB12, FOLATE, FERRITIN, TIBC, IRON, RETICCTPCT in the last 72 hours. No results for input(s): LIPASE, AMYLASE in the last 72 hours.  Urine Studies No results for input(s): UHGB, CRYS in the last 72 hours.  Invalid input(s): UACOL, UAPR, USPG, UPH, UTP, UGL, UKET, UBIL, UNIT, UROB, ULEU, UEPI, UWBC, URBC, UBAC, CAST, UCOM, BILUA  MICROBIOLOGY: Recent Results (from the past 240 hour(s))  Culture, blood (x 2)  Status: None (Preliminary result)   Collection Time: 01/11/16  9:40 PM  Result Value Ref Range Status   Specimen Description BLOOD RIGHT ANTECUBITAL  Final   Special Requests BOTTLES DRAWN AEROBIC AND ANAEROBIC 10CC   Final   Culture NO GROWTH 3 DAYS  Final   Report Status PENDING  Incomplete  Culture, blood (x 2)     Status: None (Preliminary result)   Collection Time: 01/11/16  9:40 PM  Result Value Ref Range Status   Specimen Description BLOOD LEFT ARM  Final   Special Requests BOTTLES DRAWN AEROBIC AND ANAEROBIC 5CC  Final   Culture NO GROWTH 3 DAYS  Final   Report Status PENDING  Incomplete  Culture, body fluid-bottle     Status: None   Collection Time: 01/12/16  1:48 PM  Result Value Ref Range Status   Specimen Description FLUID LEFT PLEURAL  Final   Special Requests BOTTLES DRAWN AEROBIC AND ANAEROBIC 5CC  Final   Gram Stain   Final    GRAM POSITIVE COCCI IN CLUSTERS IN BOTH AEROBIC AND ANAEROBIC BOTTLES CRITICAL RESULT CALLED TO, READ BACK BY AND VERIFIED WITH: A BURTON 01/13/16 @ 17 M VESTAL    Culture STAPHYLOCOCCUS SPECIES (COAGULASE NEGATIVE)  Final   Report Status 01/15/2016 FINAL  Final   Organism ID, Bacteria STAPHYLOCOCCUS SPECIES (COAGULASE NEGATIVE)  Final      Susceptibility   Staphylococcus species (coagulase negative) - MIC*    CIPROFLOXACIN <=0.5 SENSITIVE Sensitive      ERYTHROMYCIN >=8 RESISTANT Resistant     GENTAMICIN <=0.5 SENSITIVE Sensitive     OXACILLIN >=4 RESISTANT Resistant     TETRACYCLINE >=16 RESISTANT Resistant     VANCOMYCIN 1 SENSITIVE Sensitive     TRIMETH/SULFA <=10 SENSITIVE Sensitive     CLINDAMYCIN <=0.25 SENSITIVE Sensitive     RIFAMPIN <=0.5 SENSITIVE Sensitive     Inducible Clindamycin NEGATIVE Sensitive     * STAPHYLOCOCCUS SPECIES (COAGULASE NEGATIVE)  Gram stain     Status: None   Collection Time: 01/12/16  1:48 PM  Result Value Ref Range Status   Specimen Description FLUID LEFT PLEURAL  Final   Special Requests NONE  Final   Gram Stain   Final    ABUNDANT WBC PRESENT,BOTH PMN AND MONONUCLEAR NO ORGANISMS SEEN    Report Status 01/12/2016 FINAL  Final    RADIOLOGY STUDIES/RESULTS: Dg Chest 1 View  01/12/2016  CLINICAL DATA:  Patient status post catheter placement within the left chest. EXAM: CHEST 1 VIEW COMPARISON:  Chest radiograph 01/11/2016 FINDINGS: Stable cardiac and mediastinal contours with marked enlargement of the main pulmonary artery. Unchanged scattered bilateral heterogeneous pulmonary opacities, likely scarring. Interval placement of left pleural drainage catheter into a loculated left hydropneumothorax. Regional skeleton is unremarkable. IMPRESSION: Interval placement left pigtail drainage catheter into loculated left hydropneumothorax. Electronically Signed   By: Lovey Newcomer M.D.   On: 01/12/2016 14:23   Dg Chest Port 1 View  01/11/2016  CLINICAL DATA:  Sepsis EXAM: PORTABLE CHEST 1 VIEW COMPARISON:  CT chest 12/14/2015.  Chest x-ray 11/23/2015 FINDINGS: Left pleural loculated fluid and pleural scarring unchanged from the prior study. Prior CT demonstrated air-fluid level consistent with hydro pneumothorax. This is difficult to see on the chest x-ray but probably remains. 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 Underlying chronic lung disease with COPD Marked pulmonary artery enlargement with pulmonary  artery hypertension again noted. IMPRESSION: Loculated left pleural effusion and pleural scarring unchanged with probable hydro pneumothorax on the left as  seen on the prior CT of 12/14/2015. Severe pulmonary artery hypertension. No new findings compared with earlier studies. Electronically Signed   By: Franchot Gallo M.D.   On: 01/11/2016 21:18   Ct Perc Pleural Drain W/indwell Cath W/img Guide  01/12/2016  INDICATION: 55 year old male with concern for recurrent left-sided empyema. CT-guided drainage is requested. EXAM: CT PERC PLEURAL DRAIN W/INDWELL CATH W/IMG GUIDE MEDICATIONS: The patient is currently admitted to the hospital and receiving intravenous antibiotics. The antibiotics were administered within an appropriate time frame prior to the initiation of the procedure. ANESTHESIA/SEDATION: Fentanyl  mcg IV; Versed  mg IV Moderate Sedation Time: The patient was continuously monitored during the procedure by the interventional radiology nurse under my direct supervision. COMPLICATIONS: None immediate. Estimated blood loss: 0 PROCEDURE: Informed written consent was obtained from the patient after a thorough discussion of the procedural risks, benefits and alternatives. All questions were addressed. A timeout was performed prior to the initiation of the procedure. A planning axial CT scan was performed. A thick-walled loculated hydro pneumothorax is identified on the left. A suitable skin entry site was selected and marked. The region was sterilely prepped and draped in standard fashion using chlorhexidine skin prep. Local anesthesia was attained by infiltration with 1% lidocaine. A small dermatotomy was placed. Under intermittent CT guidance an 18 gauge trocar needle was advanced through the rib space and into the empyema. An Amplatz wire was then advanced through the trocar needle and directed toward the lung apex. The needle was removed. The skin tract was dilated to 62 [place] and a Cook 14 [place]  all-purpose drainage catheter modified with multiple additional sideholes was advanced over the wire. The drain was connected to low wall suction via a pleura vac. Repeat CT imaging demonstrates excellent placement of the drain. All additional sideholes are within the pleural space and spanned the entirety of the empyema. Aspiration yields approximately 30 mL turbid bloody fluid. This was sent to the lab for culture. The tube was secured to the skin with 0 Prolene suture. An air tight bandage was applied. The patient tolerated the procedure well. IMPRESSION: Successful placement of a 33 French all-purpose drainage catheter modified with additional sideholes into the thick walled left-sided hydro pneumothorax. The aspirated material appears to be bloody. Signed, Criselda Peaches, MD [facility] Radiology Electronically Signed   By: Jacqulynn Cadet M.D.   On: 01/12/2016 14:32    Oren Binet, MD  [facility] Pager:[phone]  If 7PM-7AM, please contact night-coverage [email or web address] Password [email or web address] 01/15/2016, 10:27 AM   LOS: 4 days

## 2016-01-15 NOTE — Progress Notes (Signed)
*  PRELIMINARY RESULTS* Vascular Ultrasound Lower extremity venous duplex has been completed.  Preliminary findings: No evidence of DVT or baker's cyst.   Landry Mellow, RDMS, RVT  01/15/2016, 12:18 PM

## 2016-01-15 NOTE — Progress Notes (Signed)
Patient ID: [name] [name], male   DOB: Oct 31, 1961, 55 y.o.   MRN: [id number]    Referring Physician(s): Dr. Lanelle Bal  Supervising Physician: Corrie Mckusick  Chief Complaint: Left recurrent empyema  Subjective: Pt doesn't feel well today.    Allergies: Review of patient's allergies indicates no known allergies.  Medications: Prior to Admission medications   Not on File    Vital Signs: BP 120/74 mmHg  Pulse 82  Temp(Src) 98.9 F (37.2 C) (Oral)  Resp 18  SpO2 100%  Physical Exam: Chest: scattered rhonchi, +air leak, CT on suction, 0 output recorded for yesterday.  Imaging: Dg Chest 1 View  01/12/2016  CLINICAL DATA:  Patient status post catheter placement within the left chest. EXAM: CHEST 1 VIEW COMPARISON:  Chest radiograph 01/11/2016 FINDINGS: Stable cardiac and mediastinal contours with marked enlargement of the main pulmonary artery. Unchanged scattered bilateral heterogeneous pulmonary opacities, likely scarring. Interval placement of left pleural drainage catheter into a loculated left hydropneumothorax. Regional skeleton is unremarkable. IMPRESSION: Interval placement left pigtail drainage catheter into loculated left hydropneumothorax. Electronically Signed   By: Lovey Newcomer M.D.   On: 01/12/2016 14:23   Dg Chest Port 1 View  01/11/2016  CLINICAL DATA:  Sepsis EXAM: PORTABLE CHEST 1 VIEW COMPARISON:  CT chest 12/14/2015.  Chest x-ray 11/23/2015 FINDINGS: Left pleural loculated fluid and pleural scarring unchanged from the prior study. Prior CT demonstrated air-fluid level consistent with hydro pneumothorax. This is difficult to see on the chest x-ray but probably remains. 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 Underlying chronic lung disease with COPD Marked pulmonary artery enlargement with pulmonary artery hypertension again noted. IMPRESSION: Loculated left pleural effusion and pleural scarring unchanged with probable hydro pneumothorax on the left as seen  on the prior CT of 12/14/2015. Severe pulmonary artery hypertension. No new findings compared with earlier studies. Electronically Signed   By: Franchot Gallo M.D.   On: 01/11/2016 21:18   Ct Perc Pleural Drain W/indwell Cath W/img Guide  01/12/2016  INDICATION: 55 year old male with concern for recurrent left-sided empyema. CT-guided drainage is requested. EXAM: CT PERC PLEURAL DRAIN W/INDWELL CATH W/IMG GUIDE MEDICATIONS: The patient is currently admitted to the hospital and receiving intravenous antibiotics. The antibiotics were administered within an appropriate time frame prior to the initiation of the procedure. ANESTHESIA/SEDATION: Fentanyl  mcg IV; Versed  mg IV Moderate Sedation Time: The patient was continuously monitored during the procedure by the interventional radiology nurse under my direct supervision. COMPLICATIONS: None immediate. Estimated blood loss: 0 PROCEDURE: Informed written consent was obtained from the patient after a thorough discussion of the procedural risks, benefits and alternatives. All questions were addressed. A timeout was performed prior to the initiation of the procedure. A planning axial CT scan was performed. A thick-walled loculated hydro pneumothorax is identified on the left. A suitable skin entry site was selected and marked. The region was sterilely prepped and draped in standard fashion using chlorhexidine skin prep. Local anesthesia was attained by infiltration with 1% lidocaine. A small dermatotomy was placed. Under intermittent CT guidance an 18 gauge trocar needle was advanced through the rib space and into the empyema. An Amplatz wire was then advanced through the trocar needle and directed toward the lung apex. The needle was removed. The skin tract was dilated to 18 [place] and a Cook 14 [place] all-purpose drainage catheter modified with multiple additional sideholes was advanced over the wire. The drain was connected to low wall suction via a pleura vac. Repeat  CT  imaging demonstrates excellent placement of the drain. All additional sideholes are within the pleural space and spanned the entirety of the empyema. Aspiration yields approximately 30 mL turbid bloody fluid. This was sent to the lab for culture. The tube was secured to the skin with 0 Prolene suture. An air tight bandage was applied. The patient tolerated the procedure well. IMPRESSION: Successful placement of a 76 French all-purpose drainage catheter modified with additional sideholes into the thick walled left-sided hydro pneumothorax. The aspirated material appears to be bloody. Signed, Criselda Peaches, MD [facility] Radiology Electronically Signed   By: Jacqulynn Cadet M.D.   On: 01/12/2016 14:32    Labs:  CBC:  Recent Labs  10/16/15 0233 01/11/16 2135 01/12/16 0121 01/13/16 0256  WBC 10.1 6.9 5.7 4.7  HGB 11.0* 12.8* 11.4* 12.3*  HCT 33.2* 40.2 37.3* 41.5  PLT 406* 308 323 285    COAGS:  Recent Labs  09/26/15 1437 10/16/15 0854 01/11/16 2135  INR 1.16 1.18 1.12  APTT 32 34 32    BMP:  Recent Labs  01/12/16 0303 01/13/16 0256 01/14/16 0610 01/15/16 0235  NA 137 135 136 135  K 4.5 5.2* 3.8 3.7  CL 97* 94* 90* 89*  CO2 29 36* 37* 38*  GLUCOSE 106* 120* 109* 98  BUN 6 7 8 6   CALCIUM 8.2* 8.1* 7.9* 8.1*  CREATININE 1.06 1.18 1.07 1.10  GFRNONAA >60 >60 >60 >60  GFRAA >60 >60 >60 >60    LIVER FUNCTION TESTS:  Recent Labs  01/12/16 0303 01/13/16 0256 01/14/16 0610 01/15/16 0235  BILITOT 0.4 0.1* 0.3 0.2*  AST 26 28 26 24   ALT 17 17 15* 14*  ALKPHOS 97 86 66 60  PROT 7.7 8.3* 6.9 7.2  ALBUMIN 2.6* 2.5* 2.3* 2.3*    Assessment and Plan: 1. Recurrent left empyema, s/p drain placement on 3/10 -plan for TCTS is to remain on suction given + air leak -will follow for now  Electronically Signed: Ashawnti Tangen E 01/15/2016, 9:49 AM   I spent a total of 15 Minutes at the the patient's bedside  AND on the patient's hospital floor or unit, greater than 50% of which was counseling/coordinating care for left empyema

## 2016-01-16 LAB — CULTURE, BLOOD (ROUTINE X 2)
Culture: NO GROWTH
Culture: NO GROWTH

## 2016-01-16 LAB — PATHOLOGIST SMEAR REVIEW

## 2016-01-16 LAB — ANCA TITERS: C-ANCA: <1:20 {titer}

## 2016-01-16 LAB — ANTINUCLEAR ANTIBODIES, IFA: ANTINUCLEAR ANTIBODIES, IFA: NEGATIVE

## 2016-01-16 MED ORDER — SODIUM CHLORIDE 0.9% FLUSH
10.0000 mL | INTRAVENOUS | Status: DC | PRN
  Administered 2016-01-18 – 2016-01-23 (×3): 10 mL
  Filled 2016-01-16 (×3): qty 40

## 2016-01-16 NOTE — Progress Notes (Signed)
Peripherally Inserted Central Catheter/Midline Placement  The IV Nurse has discussed with the patient and/or persons authorized to consent for the patient, the purpose of this procedure and the potential benefits and risks involved with this procedure.  The benefits include less needle sticks, lab draws from the catheter and patient may be discharged home with the catheter.  Risks include, but not limited to, infection, bleeding, blood clot (thrombus formation), and puncture of an artery; nerve damage and irregular heat beat.  Alternatives to this procedure were also discussed.Consent obtained by Christella Noa, RN.  PICC/Midline Placement Documentation  PICC Single Lumen 10/19/15 PICC Right Brachial 36 cm 0 cm (Active)       [name] [name], [name] [name] 01/16/2016, 8:38 PM

## 2016-01-16 NOTE — Progress Notes (Signed)
[facility]  Patient Status:  New pt for [facility] this admission   [facility] is providing the following services: HHRN and Home Infusion pharmacy for home IV ABX. [facility] hospital team will follow pt while here to support transition home when ordered.  If patient discharges after hours, please call [phone].   [name] 01/16/2016, 3:33 PM

## 2016-01-16 NOTE — Progress Notes (Signed)
PATIENT DETAILS Name: [name] [name] Age: 55 y.o. Sex: male Date of Birth: 1960/12/21 Admit Date: 01/11/2016 Admitting Physician Geradine Girt, DO PCP:No primary care provider on file.  Subjective: Feels weak-afebrile overnight  Assessment/Plan: Principal Problem: SIRS (systemic inflammatory response syndrome): SIRS pathophysiology resolved, fever curve muchbetter.  Secondary to recurrent left-sided empyema. Blood cultures negative, pleural fluid culture positive for coag neg staph-continue empiric vancomycin. ID following.  Active Problems: Recurrent left-sided empyema: Seen by cardiothoracic surgery, underwent CT-guided chest tube placement on 3/10. Pleural fluid cultures positive for coag neg staph-continue empiric vancomycin and Zosyn. Await ID eval.CTVS/IR following.  Hyperkalemia: Resolved. Follow electrolytes periodically  Pulmonary hypertension: Echocardiogram confirms severe Pul HTN-PA pressure of 57mm Hg.  ANA, ANCA serology pending, RA factor neg. Recent HIV test was negative as well. Will need folllow up with cardiology at some point.  Disposition: Remain inpatient  Antimicrobial agents  See below  Anti-infectives    Start     Dose/Rate Route Frequency Ordered Stop   01/14/16 0800  vancomycin (VANCOCIN) IVPB 1000 mg/200 mL premix     1,000 mg 200 mL/hr over 60 Minutes Intravenous Every 24 hours 01/14/16 0723     01/13/16 1830  vancomycin (VANCOCIN) IVPB 750 mg/150 ml premix  Status:  Discontinued     750 mg 150 mL/hr over 60 Minutes Intravenous  Once 01/13/16 1820 01/13/16 1821   01/13/16 1830  vancomycin (VANCOCIN) IVPB 1000 mg/200 mL premix  Status:  Discontinued     1,000 mg 200 mL/hr over 60 Minutes Intravenous Every 24 hours 01/13/16 1822 01/14/16 0723   01/12/16 2330  vancomycin (VANCOCIN) IVPB 1000 mg/200 mL premix  Status:  Discontinued     1,000 mg 200 mL/hr over 60 Minutes Intravenous Every 24 hours 01/11/16 2256 01/12/16 1519   01/12/16  0600  piperacillin-tazobactam (ZOSYN) IVPB 3.375 g  Status:  Discontinued     3.375 g 12.5 mL/hr over 240 Minutes Intravenous 3 times per day 01/11/16 2256 01/15/16 1237   01/11/16 2300  vancomycin (VANCOCIN) IVPB 1000 mg/200 mL premix     1,000 mg 200 mL/hr over 60 Minutes Intravenous STAT 01/11/16 2254 01/12/16 0330   01/11/16 2100  vancomycin (VANCOCIN) IVPB 750 mg/150 ml premix  Status:  Discontinued     750 mg 150 mL/hr over 60 Minutes Intravenous  Once 01/11/16 2055 01/11/16 2254   01/11/16 2045  piperacillin-tazobactam (ZOSYN) IVPB 3.375 g     3.375 g 100 mL/hr over 30 Minutes Intravenous  Once 01/11/16 2044 01/12/16 0406   01/11/16 2045  vancomycin (VANCOCIN) IVPB 1000 mg/200 mL premix  Status:  Discontinued     1,000 mg 200 mL/hr over 60 Minutes Intravenous  Once 01/11/16 2044 01/11/16 2055      DVT Prophylaxis: Prophylactic Lovenox   Code Status: Full code   Family Communication None at bedside  Procedures: None  CONSULTS:  ID and Cardiothoracic surgery  Time spent 20 minutes-Greater than 50% of this time was spent in counseling, explanation of diagnosis, planning of further management, and coordination of care.  MEDICATIONS: Scheduled Meds: . enoxaparin (LOVENOX) injection  20 mg Subcutaneous Q24H  . pantoprazole  40 mg Oral Daily  . sodium chloride flush  3 mL Intravenous Q12H  . vancomycin  1,000 mg Intravenous Q24H   Continuous Infusions:  PRN Meds:.acetaminophen **OR** acetaminophen, benzonatate, HYDROcodone-acetaminophen, ipratropium-albuterol, ondansetron **OR** ondansetron (ZOFRAN) IV, technetium TC 28M diethylenetriame-pentaacetic acid  PHYSICAL EXAM: Vital signs in last 24 hours: Filed Vitals:   01/15/16 1500 01/15/16 1926 01/15/16 2020 01/16/16 0457  BP: 112/64  106/64 104/69  Pulse: 69  76 77  Temp: 97.7 F (36.5 C)  98.5 F (36.9 C) 98.1 F (36.7 C)  TempSrc: Oral  Oral Oral  Resp: 18  18 18   Height:  5' (1.524 m)    Weight:   41.731 kg (92 lb)    SpO2: 97%  97% 99%    Weight change:  Filed Weights   01/15/16 1926  Weight: 41.731 kg (92 lb)   Body mass index is 17.97 kg/(m^2).   Gen Exam: Awake and alert with clear speech.  Neck: Supple, No JVD.   Chest: B/L Clear.  Chest tube in place. CVS: S1 S2 Regular, no murmurs.  Abdomen: soft, BS +, non tender, non distended.  Extremities: no edema, lower extremities warm to touch. Neurologic: Non Focal.   Skin: No Rash.   Wounds: N/A.    Intake/Output from previous day:  Intake/Output Summary (Last 24 hours) at 01/16/16 1321 Last data filed at 01/16/16 0900  Gross per 24 hour  Intake    240 ml  Output     36 ml  Net    204 ml     LAB RESULTS: CBC  Recent Labs Lab 01/11/16 2135 01/12/16 0121 01/13/16 0256  WBC 6.9 5.7 4.7  HGB 12.8* 11.4* 12.3*  HCT 40.2 37.3* 41.5  PLT 308 323 285  MCV 85.2 84.8 87.4  MCH 27.1 25.9* 25.9*  MCHC 31.8 30.6 29.6*  RDW 15.1 14.8 15.0  LYMPHSABS 0.8 0.7 1.5  MONOABS 0.7 0.8 0.5  EOSABS 0.1 0.1 0.0  BASOSABS 0.0 0.0 0.0    Chemistries   Recent Labs Lab 01/11/16 2135 01/12/16 0121 01/12/16 0303 01/13/16 0256 01/14/16 0610 01/15/16 0235  NA 135  --  137 135 136 135  K 4.3  --  4.5 5.2* 3.8 3.7  CL 96*  --  97* 94* 90* 89*  CO2 33*  --  29 36* 37* 38*  GLUCOSE 106*  --  106* 120* 109* 98  BUN <5*  --  6 7 8 6   CREATININE 1.08 1.11 1.06 1.18 1.07 1.10  CALCIUM 8.5*  --  8.2* 8.1* 7.9* 8.1*  MG 2.0  --   --   --   --   --     CBG: No results for input(s): GLUCAP in the last 168 hours.  GFR Estimated Creatinine Clearance: 45.8 mL/min (by C-G formula based on Cr of 1.1).  Coagulation profile  Recent Labs Lab 01/11/16 2135  INR 1.12    Cardiac Enzymes No results for input(s): CKMB, TROPONINI, MYOGLOBIN in the last 168 hours.  Invalid input(s): CK  Invalid input(s): POCBNP No results for input(s): DDIMER in the last 72 hours. No results for input(s): HGBA1C in the last 72 hours. No  results for input(s): CHOL, HDL, LDLCALC, TRIG, CHOLHDL, LDLDIRECT in the last 72 hours. No results for input(s): TSH, T4TOTAL, T3FREE, THYROIDAB in the last 72 hours.  Invalid input(s): FREET3 No results for input(s): VITAMINB12, FOLATE, FERRITIN, TIBC, IRON, RETICCTPCT in the last 72 hours. No results for input(s): LIPASE, AMYLASE in the last 72 hours.  Urine Studies No results for input(s): UHGB, CRYS in the last 72 hours.  Invalid input(s): UACOL, UAPR, USPG, UPH, UTP, UGL, UKET, UBIL, UNIT, UROB, ULEU, UEPI, UWBC, URBC, UBAC, CAST, UCOM, BILUA  MICROBIOLOGY: Recent Results (from the past  240 hour(s))  Culture, blood (x 2)     Status: None   Collection Time: 01/11/16  9:40 PM  Result Value Ref Range Status   Specimen Description BLOOD RIGHT ANTECUBITAL  Final   Special Requests BOTTLES DRAWN AEROBIC AND ANAEROBIC 10CC   Final   Culture NO GROWTH 5 DAYS  Final   Report Status 01/16/2016 FINAL  Final  Culture, blood (x 2)     Status: None   Collection Time: 01/11/16  9:40 PM  Result Value Ref Range Status   Specimen Description BLOOD LEFT ARM  Final   Special Requests BOTTLES DRAWN AEROBIC AND ANAEROBIC 5CC  Final   Culture NO GROWTH 5 DAYS  Final   Report Status 01/16/2016 FINAL  Final  Culture, body fluid-bottle     Status: None   Collection Time: 01/12/16  1:48 PM  Result Value Ref Range Status   Specimen Description FLUID LEFT PLEURAL  Final   Special Requests BOTTLES DRAWN AEROBIC AND ANAEROBIC 5CC  Final   Gram Stain   Final    GRAM POSITIVE COCCI IN CLUSTERS IN BOTH AEROBIC AND ANAEROBIC BOTTLES CRITICAL RESULT CALLED TO, READ BACK BY AND VERIFIED WITH: A BURTON 01/13/16 @ 25 M VESTAL    Culture STAPHYLOCOCCUS SPECIES (COAGULASE NEGATIVE)  Final   Report Status 01/15/2016 FINAL  Final   Organism ID, Bacteria STAPHYLOCOCCUS SPECIES (COAGULASE NEGATIVE)  Final      Susceptibility   Staphylococcus species (coagulase negative) - MIC*    CIPROFLOXACIN <=0.5 SENSITIVE  Sensitive     ERYTHROMYCIN >=8 RESISTANT Resistant     GENTAMICIN <=0.5 SENSITIVE Sensitive     OXACILLIN >=4 RESISTANT Resistant     TETRACYCLINE >=16 RESISTANT Resistant     VANCOMYCIN 1 SENSITIVE Sensitive     TRIMETH/SULFA <=10 SENSITIVE Sensitive     CLINDAMYCIN <=0.25 SENSITIVE Sensitive     RIFAMPIN <=0.5 SENSITIVE Sensitive     Inducible Clindamycin NEGATIVE Sensitive     * STAPHYLOCOCCUS SPECIES (COAGULASE NEGATIVE)  Gram stain     Status: None   Collection Time: 01/12/16  1:48 PM  Result Value Ref Range Status   Specimen Description FLUID LEFT PLEURAL  Final   Special Requests NONE  Final   Gram Stain   Final    ABUNDANT WBC PRESENT,BOTH PMN AND MONONUCLEAR NO ORGANISMS SEEN    Report Status 01/12/2016 FINAL  Final    RADIOLOGY STUDIES/RESULTS: Dg Chest 1 View  01/12/2016  CLINICAL DATA:  Patient status post catheter placement within the left chest. EXAM: CHEST 1 VIEW COMPARISON:  Chest radiograph 01/11/2016 FINDINGS: Stable cardiac and mediastinal contours with marked enlargement of the main pulmonary artery. Unchanged scattered bilateral heterogeneous pulmonary opacities, likely scarring. Interval placement of left pleural drainage catheter into a loculated left hydropneumothorax. Regional skeleton is unremarkable. IMPRESSION: Interval placement left pigtail drainage catheter into loculated left hydropneumothorax. Electronically Signed   By: Lovey Newcomer M.D.   On: 01/12/2016 14:23   Nm Pulmonary Perf And Vent  01/15/2016  CLINICAL DATA:  Pulmonary hypertension. Possible chronic PE. Left-sided chest tube in place for empyema. EXAM: NUCLEAR MEDICINE VENTILATION - PERFUSION LUNG SCAN TECHNIQUE: Ventilation images were obtained in multiple projections using inhaled aerosol Tc-40m DTPA. Perfusion images were obtained in multiple projections after intravenous injection of Tc-35m MAA. RADIOPHARMACEUTICALS:  31.1 Technetium-36m DTPA aerosol inhalation and 4.1 Technetium-53m MAA IV  COMPARISON:  Chest CT 12/14/2015 FINDINGS: Ventilation: Very limited ventilation examination. Central deposition of the radiopharmaceutical and limited visualization of lungs.  Perfusion: Small left hemi thorax due to a large complex left pleural fluid collection. No definite segmental or subsegmental perfusion defects to suggest pulmonary embolism. IMPRESSION: Limited examination due to the limited ventilation study. Small left hemi thorax due to a left pleural fluid collection. No findings suspicious for pulmonary embolism. Electronically Signed   By: Marijo Sanes M.D.   On: 01/15/2016 15:35   Dg Chest Port 1 View  01/11/2016  CLINICAL DATA:  Sepsis EXAM: PORTABLE CHEST 1 VIEW COMPARISON:  CT chest 12/14/2015.  Chest x-ray 11/23/2015 FINDINGS: Left pleural loculated fluid and pleural scarring unchanged from the prior study. Prior CT demonstrated air-fluid level consistent with hydro pneumothorax. This is difficult to see on the chest x-ray but probably remains. 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 Underlying chronic lung disease with COPD Marked pulmonary artery enlargement with pulmonary artery hypertension again noted. IMPRESSION: Loculated left pleural effusion and pleural scarring unchanged with probable hydro pneumothorax on the left as seen on the prior CT of 12/14/2015. Severe pulmonary artery hypertension. No new findings compared with earlier studies. Electronically Signed   By: Franchot Gallo M.D.   On: 01/11/2016 21:18   Ct Perc Pleural Drain W/indwell Cath W/img Guide  01/12/2016  INDICATION: 55 year old male with concern for recurrent left-sided empyema. CT-guided drainage is requested. EXAM: CT PERC PLEURAL DRAIN W/INDWELL CATH W/IMG GUIDE MEDICATIONS: The patient is currently admitted to the hospital and receiving intravenous antibiotics. The antibiotics were administered within an appropriate time frame prior to the initiation of the procedure. ANESTHESIA/SEDATION: Fentanyl  mcg  IV; Versed  mg IV Moderate Sedation Time: The patient was continuously monitored during the procedure by the interventional radiology nurse under my direct supervision. COMPLICATIONS: None immediate. Estimated blood loss: 0 PROCEDURE: Informed written consent was obtained from the patient after a thorough discussion of the procedural risks, benefits and alternatives. All questions were addressed. A timeout was performed prior to the initiation of the procedure. A planning axial CT scan was performed. A thick-walled loculated hydro pneumothorax is identified on the left. A suitable skin entry site was selected and marked. The region was sterilely prepped and draped in standard fashion using chlorhexidine skin prep. Local anesthesia was attained by infiltration with 1% lidocaine. A small dermatotomy was placed. Under intermittent CT guidance an 18 gauge trocar needle was advanced through the rib space and into the empyema. An Amplatz wire was then advanced through the trocar needle and directed toward the lung apex. The needle was removed. The skin tract was dilated to 50 [place] and a Cook 14 [place] all-purpose drainage catheter modified with multiple additional sideholes was advanced over the wire. The drain was connected to low wall suction via a pleura vac. Repeat CT imaging demonstrates excellent placement of the drain. All additional sideholes are within the pleural space and spanned the entirety of the empyema. Aspiration yields approximately 30 mL turbid bloody fluid. This was sent to the lab for culture. The tube was secured to the skin with 0 Prolene suture. An air tight bandage was applied. The patient tolerated the procedure well. IMPRESSION: Successful placement of a 12 French all-purpose drainage catheter modified with additional sideholes into the thick walled left-sided hydro pneumothorax. The aspirated material appears to be bloody. Signed, Criselda Peaches, MD Vascular and Interventional Radiology  [facility] Radiology Electronically Signed   By: Jacqulynn Cadet M.D.   On: 01/12/2016 14:32    Oren Binet, MD  [facility] Pager:[phone]  If 7PM-7AM, please contact night-coverage [email or web address]  Password [email or web address] 01/16/2016, 1:21 PM   LOS: 5 days

## 2016-01-16 NOTE — Progress Notes (Signed)
    Referring Physician(s): Dr Lanelle Bal  Supervising Physician: Markus Daft  Chief Complaint:  Left empyema  Subjective: Chest tube drain placed 3/10 Feels some better---slowly!! Resting Painful to cough Chest tube in place  Allergies: Review of patient's allergies indicates no known allergies.  Medications: Prior to Admission medications   Not on File     Vital Signs: BP 104/69 mmHg  Pulse 77  Temp(Src) 98.1 F (36.7 C) (Oral)  Resp 18  Ht 5' (1.524 m)  Wt 92 lb (41.731 kg)  BMI 17.97 kg/m2  SpO2 99%  Physical Exam  Pulmonary/Chest: Effort normal. He has wheezes.  Skin: Skin is warm and dry.  Site is clean and dry NT no bleeding No air leak 160 cc bloody fluid in pleurvac    Imaging: Nm Pulmonary Perf And Vent  01/15/2016  CLINICAL DATA:  Pulmonary hypertension. Possible chronic PE. Left-sided chest tube in place for empyema. EXAM: NUCLEAR MEDICINE VENTILATION - PERFUSION LUNG SCAN TECHNIQUE: Ventilation images were obtained in multiple projections using inhaled aerosol Tc-63m DTPA. Perfusion images were obtained in multiple projections after intravenous injection of Tc-59m MAA. RADIOPHARMACEUTICALS:  31.1 Technetium-72m DTPA aerosol inhalation and 4.1 Technetium-23m MAA IV COMPARISON:  Chest CT 12/14/2015 FINDINGS: Ventilation: Very limited ventilation examination. Central deposition of the radiopharmaceutical and limited visualization of lungs. Perfusion: Small left hemi thorax due to a large complex left pleural fluid collection. No definite segmental or subsegmental perfusion defects to suggest pulmonary embolism. IMPRESSION: Limited examination due to the limited ventilation study. Small left hemi thorax due to a left pleural fluid collection. No findings suspicious for pulmonary embolism. Electronically Signed   By: Marijo Sanes M.D.   On: 01/15/2016 15:35    Labs:  CBC:  Recent Labs  10/16/15 0233 01/11/16 2135 01/12/16 0121 01/13/16 0256    WBC 10.1 6.9 5.7 4.7  HGB 11.0* 12.8* 11.4* 12.3*  HCT 33.2* 40.2 37.3* 41.5  PLT 406* 308 323 285    COAGS:  Recent Labs  09/26/15 1437 10/16/15 0854 01/11/16 2135  INR 1.16 1.18 1.12  APTT 32 34 32    BMP:  Recent Labs  01/12/16 0303 01/13/16 0256 01/14/16 0610 01/15/16 0235  NA 137 135 136 135  K 4.5 5.2* 3.8 3.7  CL 97* 94* 90* 89*  CO2 29 36* 37* 38*  GLUCOSE 106* 120* 109* 98  BUN 6 7 8 6   CALCIUM 8.2* 8.1* 7.9* 8.1*  CREATININE 1.06 1.18 1.07 1.10  GFRNONAA >60 >60 >60 >60  GFRAA >60 >60 >60 >60    LIVER FUNCTION TESTS:  Recent Labs  01/12/16 0303 01/13/16 0256 01/14/16 0610 01/15/16 0235  BILITOT 0.4 0.1* 0.3 0.2*  AST 26 28 26 24   ALT 17 17 15* 14*  ALKPHOS 97 86 66 60  PROT 7.7 8.3* 6.9 7.2  ALBUMIN 2.6* 2.5* 2.3* 2.3*    Assessment and Plan:  L empyema Left chest tube placed 3/10---Intact Will folow  Electronically Signed: Jordon Bourquin A 01/16/2016, 3:05 PM   I spent a total of 15 Minutes at the the patient's bedside AND on the patient's hospital floor or unit, greater than 50% of which was counseling/coordinating care for L chest tube drain

## 2016-01-16 NOTE — Progress Notes (Signed)
      [address]       [place] [address]             [phone]      Subjective:  Mr. Garrand has no new complaints.  Continues to feel poorly  Objective: Vital signs in last 24 hours: Temp:  [97.7 F (36.5 C)-98.5 F (36.9 C)] 98.1 F (36.7 C) (03/14 0457) Pulse Rate:  [69-77] 77 (03/14 0457) Cardiac Rhythm:  [-] Normal sinus rhythm (03/13 1902) Resp:  [18] 18 (03/14 0457) BP: (104-112)/(64-69) 104/69 mmHg (03/14 0457) SpO2:  [97 %-99 %] 99 % (03/14 0457) Weight:  [92 lb (41.731 kg)] 92 lb (41.731 kg) (03/13 1926)  Intake/Output from previous day: 03/13 0701 - 03/14 0700 In: 240 [P.O.:240] Out: 536 [Urine:500; Chest Tube:36]  General appearance: alert, cooperative and no distress Heart: regular rate and rhythm Lungs: clear to auscultation bilaterally Wound: clean and dry  Lab Results: No results for input(s): WBC, HGB, HCT, PLT in the last 72 hours. BMET:  Recent Labs  01/14/16 0610 01/15/16 0235  NA 136 135  K 3.8 3.7  CL 90* 89*  CO2 37* 38*  GLUCOSE 109* 98  BUN 8 6  CREATININE 1.07 1.10  CALCIUM 7.9* 8.1*    PT/INR: No results for input(s): LABPROT, INR in the last 72 hours. ABG    Component Value Date/Time   PHART 7.331* 10/03/2015 0317   HCO3 31.4* 10/03/2015 0317   TCO2 33.2 10/03/2015 0317   O2SAT 99.3 10/03/2015 0317   CBG (last 3)  No results for input(s): GLUCAP in the last 72 hours.  Assessment/Plan:  1. Chest tube- no air leak appreciated, minimal output yesterday- will leave on suction for now 2. ID- + Methicillin Resistant CN Staph, ID following, ABX have been adjusted, not currently afebrile 3. Dispo- continue chest tube, ABX, continue current care   LOS: 5 days    Ahmed Prima, [name] 01/16/2016

## 2016-01-16 NOTE — Progress Notes (Signed)
Patient ID: [name] [name], male   DOB: August 30, 1961, 55 y.o.   MRN: [id number]         [facility] for Infectious Disease    Date of Admission:  01/11/2016   Total days of antibiotics 6  He is sitting up in bed. He looks like he is feeling better today. I will have a PICC placed. I will arrange follow-up in our clinic on 02/06/2016. I would continue vancomycin at least until at follow-up visit. I will sign off now.         Michel Bickers, MD [facility] for Infectious [facility] [phone] pager   [phone] cell 11/07/2015, 1:32 PM

## 2016-01-16 NOTE — Progress Notes (Signed)
Pharmacy Antibiotic Note  57 YOM who was a direct admit from the CVTS office on 01/11/2016 after presenting with SOB and need for a CT placement. The patient has a history of a chronic empyema that was surgically drained in 2003 and again in 2016.  Pharmacy has been consulted to manage vancomycin for CoNS growing in pleural fluid culture.  ID suspects this is not a relapse of his empyema but a new process by a super infected staph.  Patient's renal function has been stable.   Plan: - Vanc 1gm IV Q24H - Monitor renal fxn, clinical progress - VT in AM  Height: 5' (152.4 cm) Weight: 92 lb (41.731 kg) IBW/kg (Calculated) : 50  Temp (24hrs), Avg:98.1 F (36.7 C), Min:97.7 F (36.5 C), Max:98.5 F (36.9 C)   Recent Labs Lab 01/11/16 2135 01/12/16 0007 01/12/16 0121 01/12/16 0303 01/13/16 0256 01/14/16 0610 01/15/16 0235  WBC 6.9  --  5.7  --  4.7  --   --   CREATININE 1.08  --  1.11 1.06 1.18 1.07 1.10  LATICACIDVEN 0.7 0.6  --   --   --   --   --     Estimated Creatinine Clearance: 45.8 mL/min (by C-G formula based on Cr of 1.1).    No Known Allergies  Antimicrobials this admission: Vanc 3/10 x1,  restarted 3/12 >>  Zosyn 3/10 >> 3/13  Dose adjustments this admission: N/A  Microbiology results: 3/9 BCx x2 - NGTD 3/10 left pleural fluid - CoNS   Vannesa Abair D. Mina Marble, PharmD, BCPS Pager:  [phone] 01/16/2016, 9:47 AM

## 2016-01-17 ENCOUNTER — Inpatient Hospital Stay (HOSPITAL_COMMUNITY): Payer: BLUE CROSS/BLUE SHIELD

## 2016-01-17 DIAGNOSIS — I272 Other secondary pulmonary hypertension: Secondary | ICD-10-CM

## 2016-01-17 LAB — VANCOMYCIN, TROUGH: VANCOMYCIN TR: 7 ug/mL — AB (ref 10.0–20.0)

## 2016-01-17 MED ORDER — VANCOMYCIN HCL IN DEXTROSE 750-5 MG/150ML-% IV SOLN
750.0000 mg | Freq: Two times a day (BID) | INTRAVENOUS | Status: DC
Start: 2016-01-17 — End: 2016-01-18
  Administered 2016-01-17 (×2): 750 mg via INTRAVENOUS
  Filled 2016-01-17 (×4): qty 150

## 2016-01-17 NOTE — Progress Notes (Addendum)
Attempted to get interpreter to explain heart catheterization to Pt in native tongue [name] [name].  No interpreters were available in this dialect.  This nurse explained procedure reinforcing physician explanation of procedure using drawings of heart and lungs.  Pt able to indicate understanding and sign informed consent.

## 2016-01-17 NOTE — Progress Notes (Signed)
      [address]       [place] [address]             [phone]      Subjective:  [name] [name] has no new complaints.  He now has an air leak from his chest tube which was not present yesterday.  Objective: Vital signs in last 24 hours: Temp:  [97.8 F (36.6 C)-98 F (36.7 C)] 98 F (36.7 C) (03/15 0429) Pulse Rate:  [77-80] 80 (03/15 0429) Cardiac Rhythm:  [-] Normal sinus rhythm (03/15 0803) Resp:  [16-18] 18 (03/15 0429) BP: (107-127)/(72-80) 107/72 mmHg (03/15 0429) SpO2:  [96 %-100 %] 99 % (03/15 0429)  Intake/Output from previous day: 03/14 0701 - 03/15 0700 In: 240 [P.O.:240] Out: 0  Intake/Output this shift: Total I/O In: -  Out: 70 [Chest Tube:70]  General appearance: alert, cooperative and no distress Heart: regular rate and rhythm Lungs: clear to auscultation bilaterally and sounds like there is air leaking from lung Wound: clean and dry  Lab Results: No results for input(s): WBC, HGB, HCT, PLT in the last 72 hours. BMET:  Recent Labs  01/15/16 0235  NA 135  K 3.7  CL 89*  CO2 38*  GLUCOSE 98  BUN 6  CREATININE 1.10  CALCIUM 8.1*    PT/INR: No results for input(s): LABPROT, INR in the last 72 hours. ABG    Component Value Date/Time   PHART 7.331* 10/03/2015 0317   HCO3 31.4* 10/03/2015 0317   TCO2 33.2 10/03/2015 0317   O2SAT 99.3 10/03/2015 0317   CBG (last 3)  No results for input(s): GLUCAP in the last 72 hours.  Assessment/Plan:  1. Chest tube- new air leak this morning, CXR order placed-leave on suction 2. ID- empyema, ID following continue ABX 3. Dispo- care per primary   LOS: 6 days    Ahmed Prima, [name] 01/17/2016

## 2016-01-17 NOTE — Consult Note (Signed)
Advanced Heart Failure Team Consult Note  Referring Physician: Dr Servando Snare Primary Physician:  None on file Primary Cardiologist:    Reason for Consultation: PAH  HPI:    [name] [name] is a 55 y.o. [place] male with PMH of urolithiasis, HTN, and chronic empyema directly admitted to hospital from Dr Everrett Coombe office for recurrent empyema.  He has a history of remote lung decortication. He was recently admitted in 09/2015 for empyema, underwent a VATs procedure and chest tube placement and PICC line placement for outpatient IV ABX.  He was non compliant with Cardiothoracic surgery and other provider visits and continued to go to the ED for care, where his chest tube was eventually removed in 10/2015.  Pertinent admission labs include WBC 6.9, K 4.5, creatinine 1.06, Lactic acid 0.6. Had image guided placement of pigtail chest tube on the left by Dr Laurence Ferrari 01/12/16.   History slightly limited with English second language. He has had some hemoptysis and SOB. Continues to feel poorly overall.  Chest tube remains on suction. ID following for + methicillin resistant CN Staph. He states prior to admission he did have some DOE.  He has to take breaks walking around grocery store.  Denies SOB with ADLs just as bathing and changing clothes.  He has never smoked.  Denies lightheadedness or dizziness. Has non-specific chest pain, not related to activity or relieved by rest. Denies peripheral edema or orthopnea.  He does have bendopnea.   Echo 01/14/16 LVEF 0000000, septal diastolic and systolic flattening. Trivial MR, Mildly dilated RV with mild/mod reduced RV function. Mild TR, PA peak pressure 81 mm Hg.   Review of Systems: [y] = yes, [ ]  = no   General: Weight gain [ ] ; Weight loss [ ] ; Anorexia [ ] ; Fatigue [ ] ; Fever [y]; Chills [ ] ; Weakness [y]  Cardiac: Chest pain/pressure [ ] ; Resting SOB [ ] ; Exertional SOB [y]; Orthopnea [ ] ; Pedal Edema [ ] ; Palpitations [ ] ; Syncope [ ] ; Presyncope [ ] ;  Paroxysmal nocturnal dyspnea[ ]   Pulmonary: Cough [y]; Wheezing[ ] ; Hemoptysis[y]; Sputum [y]; Snoring [ ]   GI: Vomiting[ ] ; Dysphagia[ ] ; Melena[ ] ; Hematochezia [ ] ; Heartburn[ ] ; Abdominal pain [ ] ; Constipation [ ] ; Diarrhea [ ] ; BRBPR [ ]   GU: Hematuria[ ] ; Dysuria [ ] ; Nocturia[ ]   Vascular: Pain in legs with walking [ ] ; Pain in feet with lying flat [ ] ; Non-healing sores [ ] ; Stroke [ ] ; TIA [ ] ; Slurred speech [ ] ;  Neuro: Headaches[ ] ; Vertigo[ ] ; Seizures[ ] ; Paresthesias[ ] ;Blurred vision [ ] ; Diplopia [ ] ; Vision changes [ ]   Ortho/Skin: Arthritis [ ] ; Joint pain [ ] ; Muscle pain [ ] ; Joint swelling [ ] ; Back Pain [ ] ; Rash [ ]   Psych: Depression[ ] ; Anxiety[ ]   Heme: Bleeding problems [ ] ; Clotting disorders [ ] ; Anemia [ ]   Endocrine: Diabetes [ ] ; Thyroid dysfunction[ ]   Home Medications Prior to Admission medications   Not on File    Past Medical History: Past Medical History  Diagnosis Date  . Kidney stones   . Hypertension     Past Surgical History: Past Surgical History  Procedure Laterality Date  . Kidney surgery    . Video assisted thoracoscopy (vats)/decortication Left 2003  . Video assisted thoracoscopy (vats)/decortication Left 10/02/2015    Procedure: VIDEO ASSISTED THORACOSCOPY (VATS)/DECORTICATION and drainage of chronic empyena;  Surgeon: [name], MD;  Location: [place];  Service: Thoracic;  Laterality: Left;  . Video bronchoscopy N/A 10/02/2015  Procedure: VIDEO BRONCHOSCOPY;  Surgeon: [name], MD;  Location: [place];  Service: Thoracic;  Laterality: N/A;    Family History: History reviewed. No pertinent family history.  Social History: Social History   Social History  . Marital Status: Single    Spouse Name: N/A  . Number of Children: N/A  . Years of Education: N/A   Social History Main Topics  . Smoking status: Never Smoker   . Smokeless tobacco: None  . Alcohol Use: Yes  . Drug Use: No  . Sexual Activity: Not Asked    Other Topics Concern  . None   Social History Narrative    Allergies:  No Known Allergies  Objective:    Vital Signs:   Temp:  [97.8 F (36.6 C)-98 F (36.7 C)] 98 F (36.7 C) (03/15 0429) Pulse Rate:  [77-80] 80 (03/15 0429) Resp:  [16-18] 18 (03/15 0429) BP: (107-127)/(72-80) 107/72 mmHg (03/15 0429) SpO2:  [96 %-100 %] 99 % (03/15 0429) Last BM Date: 01/16/16  Weight change: Filed Weights   01/15/16 1926  Weight: 92 lb (41.731 kg)    Intake/Output:   Intake/Output Summary (Last 24 hours) at 01/17/16 1100 Last data filed at 01/17/16 0803  Gross per 24 hour  Intake      0 ml  Output     70 ml  Net    -70 ml     Physical Exam: General:  Thin appearing, almost cachectic. NAD HEENT: normal Neck: supple. JVP 6-7. Carotids 2+ bilat; no bruits. No thyromegaly or nodule noted. Cor: PMI nondisplaced. Regular rate & rhythm. No rubs, gallops or murmurs. Lungs: Course sounds worse on L. Diminished bases. Abdomen: soft, NT, ND, no HSM. No bruits or masses. +BS  Extremities: no cyanosis,  rash, edema +clubbing Neuro: alert & orientedx3, cranial nerves grossly intact. moves all 4 extremities w/o difficulty. Affect pleasant  Telemetry: Reviewed, NSR 70s  Labs: Basic Metabolic Panel:  Recent Labs Lab 01/11/16 2135 01/12/16 0121 01/12/16 0303 01/13/16 0256 01/14/16 0610 01/15/16 0235  NA 135  --  137 135 136 135  K 4.3  --  4.5 5.2* 3.8 3.7  CL 96*  --  97* 94* 90* 89*  CO2 33*  --  29 36* 37* 38*  GLUCOSE 106*  --  106* 120* 109* 98  BUN <5*  --  6 7 8 6   CREATININE 1.08 1.11 1.06 1.18 1.07 1.10  CALCIUM 8.5*  --  8.2* 8.1* 7.9* 8.1*  MG 2.0  --   --   --   --   --   PHOS 3.1  --   --   --   --   --     Liver Function Tests:  Recent Labs Lab 01/11/16 2135 01/12/16 0303 01/13/16 0256 01/14/16 0610 01/15/16 0235  AST 32 26 28 26 24   ALT 18 17 17  15* 14*  ALKPHOS 109 97 86 66 60  BILITOT 0.4 0.4 0.1* 0.3 0.2*  PROT 8.8* 7.7 8.3* 6.9 7.2   ALBUMIN 2.9* 2.6* 2.5* 2.3* 2.3*   No results for input(s): LIPASE, AMYLASE in the last 168 hours. No results for input(s): AMMONIA in the last 168 hours.  CBC:  Recent Labs Lab 01/11/16 2135 01/12/16 0121 01/13/16 0256  WBC 6.9 5.7 4.7  NEUTROABS 5.3 4.1 2.7  HGB 12.8* 11.4* 12.3*  HCT 40.2 37.3* 41.5  MCV 85.2 84.8 87.4  PLT 308 323 285    Cardiac Enzymes: No results for input(s): CKTOTAL,  CKMB, CKMBINDEX, TROPONINI in the last 168 hours.  BNP: BNP (last 3 results) No results for input(s): BNP in the last 8760 hours.  ProBNP (last 3 results) No results for input(s): PROBNP in the last 8760 hours.   CBG: No results for input(s): GLUCAP in the last 168 hours.  Coagulation Studies: No results for input(s): LABPROT, INR in the last 72 hours.  Other results: EKG: 01/12/16 NSR 80s R-axis with RV strain v1,v2 Imaging: Nm Pulmonary Perf And Vent  01/15/2016  CLINICAL DATA:  Pulmonary hypertension. Possible chronic PE. Left-sided chest tube in place for empyema. EXAM: NUCLEAR MEDICINE VENTILATION - PERFUSION LUNG SCAN TECHNIQUE: Ventilation images were obtained in multiple projections using inhaled aerosol Tc-90m DTPA. Perfusion images were obtained in multiple projections after intravenous injection of Tc-83m MAA. RADIOPHARMACEUTICALS:  31.1 Technetium-43m DTPA aerosol inhalation and 4.1 Technetium-46m MAA IV COMPARISON:  Chest CT 12/14/2015 FINDINGS: Ventilation: Very limited ventilation examination. Central deposition of the radiopharmaceutical and limited visualization of lungs. Perfusion: Small left hemi thorax due to a large complex left pleural fluid collection. No definite segmental or subsegmental perfusion defects to suggest pulmonary embolism. IMPRESSION: Limited examination due to the limited ventilation study. Small left hemi thorax due to a left pleural fluid collection. No findings suspicious for pulmonary embolism. Electronically Signed   By: Marijo Sanes M.D.    On: 01/15/2016 15:35   Dg Chest Port 1 View  01/17/2016  CLINICAL DATA:  Emphysema.  Chest tube. EXAM: PORTABLE CHEST 1 VIEW COMPARISON:  01/12/2016 FINDINGS: Stable left chest tube. Tiny pneumothorax at the left base improved. Pleural thickening and volume loss in the left hemi thorax is stable. Right lung is stable in appearance. There is opacity inferior to the right hilum and in the peripheral right lower lung zone. Small right pleural effusion is suspected. Right PICC placed. Tip is at the cavoatrial junction. IMPRESSION: Stable left chest tube.  Tiny left basilar pneumothorax is improved. Right upper extremity PICC place with its tip at the cavoatrial junction Otherwise stable study. Electronically Signed   By: Marybelle Killings M.D.   On: 01/17/2016 10:10      Medications:     Current Medications: . enoxaparin (LOVENOX) injection  20 mg Subcutaneous Q24H  . pantoprazole  40 mg Oral Daily  . sodium chloride flush  3 mL Intravenous Q12H  . vancomycin  750 mg Intravenous Q12H     Infusions:      Assessment/Plan   1. Pulmonary HTN with cor pulmonale - PA pressure 81 mm HG by echo. -  ANA negative, ANCA negative, RF negative.  - Recent HIV/HCV test negative - VQ scan low probability. - LE dopplers negative for DVT.  - On schedule for RHC tomorrow AM now that he is no longer febrile 2. Recurrent left-sided empyema with sepsis - Per primary and Dr Servando Snare. 3. Hyperkalemia - Resolved. Follow electrolytes closely.   Length of Stay: 6  Shirley Friar PA-C 01/17/2016, 11:00 AM  Advanced Heart Failure Team Pager [phone] (M-F; 7a - 4p)  Please contact [facility] for night-coverage after hours (4p -7a ) and weekends on [email or web address]  Patient seen and examined with Oda Kilts, PA-C. We discussed all aspects of the encounter. I agree with the assessment and plan as stated above.   He has evidence of PAH of unknown etiology. 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 Will  plan RHC in am. Will need assessment for home O2 prior to d/c.   Bensimhon, Daniel,MD 4:35 PM

## 2016-01-17 NOTE — Progress Notes (Addendum)
Low/small air leak noted this am in [place] Chest tube drain, also with cough, dressing intact and all connections to chest tube secure. Patient with productive cough frothy in appearance Will monitor patient. Hubbard Seldon, Bettina Gavia RN

## 2016-01-17 NOTE — Progress Notes (Signed)
Pharmacy Antibiotic Note  11 YOM with history of empyema s/p VATS in 2016 and antibiotic treatment for 3 weeks.  Now with CoNS growing in pleural fluid culture and ID thinks it is a new process by a super infected staph.  Patient continues on vancomycin and his vancomycin trough is sub-therapeutic.  Patient's renal function has been stable.  Plan: - Change vanc to 750mg  IV Q12H - Monitor renal fxn, clinical progress, repeat vanc trough prior to 4th dose of new regimen   Height: 5' (152.4 cm) Weight: 92 lb (41.731 kg) IBW/kg (Calculated) : 50  Temp (24hrs), Avg:97.9 F (36.6 C), Min:97.8 F (36.6 C), Max:98 F (36.7 C)   Recent Labs Lab 01/11/16 2135 01/12/16 0007 01/12/16 0121 01/12/16 0303 01/13/16 0256 01/14/16 0610 01/15/16 0235 01/17/16 0708  WBC 6.9  --  5.7  --  4.7  --   --   --   CREATININE 1.08  --  1.11 1.06 1.18 1.07 1.10  --   LATICACIDVEN 0.7 0.6  --   --   --   --   --   --   VANCOTROUGH  --   --   --   --   --   --   --  7*    Estimated Creatinine Clearance: 45.8 mL/min (by C-G formula based on Cr of 1.1).    No Known Allergies  Antimicrobials this admission: Vanc 3/10 x1,  restarted 3/12 >>  Zosyn 3/10 >> 3/13  Dose adjustments this admission: 3/15 VT = 7 mcg/ml on 1g q24 >> 750mg  q12  Microbiology results: 3/9 BCx x2 - NGTD 3/10 left pleural fluid - CoNS   Zahava Quant D. Mina Marble, PharmD, BCPS Pager:  [phone] 01/17/2016, 9:21 AM

## 2016-01-17 NOTE — Progress Notes (Signed)
Patient ID: [name] [name], male   DOB: 1961/10/11, 55 y.o.   MRN: [id number]    Referring Physician(s): Lanelle Bal  Supervising Physician: Aletta Edouard  Chief Complaint:  Left chest empyema  Subjective: Pt states he feels "so-so"; still having some discomfort at chest tube site   Allergies: Review of patient's allergies indicates no known allergies.  Medications: Prior to Admission medications   Not on File     Vital Signs: BP 107/72 mmHg  Pulse 80  Temp(Src) 98 F (36.7 C) (Oral)  Resp 18  Ht 5' (1.524 m)  Wt 92 lb (41.731 kg)  BMI 17.97 kg/m2  SpO2 99%  Physical Exam left chest drain intact- to wall suction, new dressing placed this am; no air bubbles noted in pleuravac; output about 70 cc's bloody fluid; cx's - staph  Imaging: Nm Pulmonary Perf And Vent  01/15/2016  CLINICAL DATA:  Pulmonary hypertension. Possible chronic PE. Left-sided chest tube in place for empyema. EXAM: NUCLEAR MEDICINE VENTILATION - PERFUSION LUNG SCAN TECHNIQUE: Ventilation images were obtained in multiple projections using inhaled aerosol Tc-80m DTPA. Perfusion images were obtained in multiple projections after intravenous injection of Tc-12m MAA. RADIOPHARMACEUTICALS:  31.1 Technetium-54m DTPA aerosol inhalation and 4.1 Technetium-12m MAA IV COMPARISON:  Chest CT 12/14/2015 FINDINGS: Ventilation: Very limited ventilation examination. Central deposition of the radiopharmaceutical and limited visualization of lungs. Perfusion: Small left hemi thorax due to a large complex left pleural fluid collection. No definite segmental or subsegmental perfusion defects to suggest pulmonary embolism. IMPRESSION: Limited examination due to the limited ventilation study. Small left hemi thorax due to a left pleural fluid collection. No findings suspicious for pulmonary embolism. Electronically Signed   By: Marijo Sanes M.D.   On: 01/15/2016 15:35   Dg Chest Port 1 View  01/17/2016  CLINICAL DATA:  Emphysema.   Chest tube. EXAM: PORTABLE CHEST 1 VIEW COMPARISON:  01/12/2016 FINDINGS: Stable left chest tube. Tiny pneumothorax at the left base improved. Pleural thickening and volume loss in the left hemi thorax is stable. Right lung is stable in appearance. There is opacity inferior to the right hilum and in the peripheral right lower lung zone. Small right pleural effusion is suspected. Right PICC placed. Tip is at the cavoatrial junction. IMPRESSION: Stable left chest tube.  Tiny left basilar pneumothorax is improved. Right upper extremity PICC place with its tip at the cavoatrial junction Otherwise stable study. Electronically Signed   By: Marybelle Killings M.D.   On: 01/17/2016 10:10    Labs:  CBC:  Recent Labs  10/16/15 0233 01/11/16 2135 01/12/16 0121 01/13/16 0256  WBC 10.1 6.9 5.7 4.7  HGB 11.0* 12.8* 11.4* 12.3*  HCT 33.2* 40.2 37.3* 41.5  PLT 406* 308 323 285    COAGS:  Recent Labs  09/26/15 1437 10/16/15 0854 01/11/16 2135  INR 1.16 1.18 1.12  APTT 32 34 32    BMP:  Recent Labs  01/12/16 0303 01/13/16 0256 01/14/16 0610 01/15/16 0235  NA 137 135 136 135  K 4.5 5.2* 3.8 3.7  CL 97* 94* 90* 89*  CO2 29 36* 37* 38*  GLUCOSE 106* 120* 109* 98  BUN 6 7 8 6   CALCIUM 8.2* 8.1* 7.9* 8.1*  CREATININE 1.06 1.18 1.07 1.10  GFRNONAA >60 >60 >60 >60  GFRAA >60 >60 >60 >60    LIVER FUNCTION TESTS:  Recent Labs  01/12/16 0303 01/13/16 0256 01/14/16 0610 01/15/16 0235  BILITOT 0.4 0.1* 0.3 0.2*  AST 26 28 26 24   ALT  17 17 15* 14*  ALKPHOS 97 86 66 60  PROT 7.7 8.3* 6.9 7.2  ALBUMIN 2.6* 2.5* 2.3* 2.3*    Assessment and Plan: S/p left chest drain 3/10 for recurrent empyema, cx's with staph; AF; no new lab data; CXR today with stable placement of drain, tiny left basilar ptx. Cont current plans as outlined by [facility]. Monitor CXR.    Electronically Signed: D. Rowe Robert 01/17/2016, 1:34 PM   I spent a total of 15 minutes at the the patient's bedside AND on the  patient's hospital floor or unit, greater than 50% of which was counseling/coordinating care for left chest drain

## 2016-01-17 NOTE — Progress Notes (Addendum)
PATIENT DETAILS Name: [name] [name] Age: 55 y.o. Sex: male Date of Birth: 01-13-1961 Admit Date: 01/11/2016 Admitting Physician Geradine Girt, DO PCP:No primary care provider on file.  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 Underwent chest tube placement by IR, total foot cultures positive for coagulase negative Staphylococcus. Infectious disease recommending IV vancomycin to 4/4-1 patient will be seen at the ID clinic. Cardiothoracic surgery following-currently still with chest tube in place. Separately, patient also has been experiencing exertional dyspnea that has been gradually worsening over the past few years, he claims that even walking 20 yards will now make him short of breath. Echocardiogram this admission shows a PA pressure of 81 mmHg. See below for further details.  Subjective: Continues to feel weak. Continues to have shortness of breath with just ambulating 10-20 yards..  Assessment/Plan: Principal Problem: SIRS (systemic inflammatory response syndrome): SIRS pathophysiology resolved, fever curve much better.  Secondary to recurrent left-sided empyema. Blood cultures negative, pleural fluid culture positive for coag neg staph-continue empiric vancomycin. ID consulted during this hospital stay, recommendations are to continue vancomycin at least until 4/4-when patient has follow-up at the ID clinic, further decision to prolong or discontinue antibiotics will be made on that visit.PICC line placed on 3/14.  Active Problems: Recurrent left-sided empyema: Seen by cardiothoracic surgery, underwent CT-guided chest tube placement on 3/10. Pleural fluid cultures positive for coag neg staph-continue empiric vancomycin.CTVS/IR following-and managing chest tube. Defer chest tube management to  cardiothoracic surgery and IR.  Hyperkalemia: Resolved. Follow electrolytes periodically  Pulmonary hypertension: Echocardiogram confirms severe Pul HTN-PA pressure of 53mm Hg. RA,ANA, ANCA serology negative. Recent HIV test was negative as well. VQ scan low probability for PE, lower extremity Dopplers negative for DVT. Unfortunately has exertional dyspnea upon walking 20 yards. Given the language barrier, unreliable outpatient follow-up in the past-have consulted cardiology to see if patient would benefit from inpatient are [facility].   Disposition: Remain inpatient  Antimicrobial agents  See below  Anti-infectives    Start     Dose/Rate Route Frequency Ordered Stop   01/17/16 1000  vancomycin (VANCOCIN) IVPB 750 mg/150 ml premix     750 mg 150 mL/hr over 60 Minutes Intravenous Every 12 hours 01/17/16 0918     01/14/16 0800  vancomycin (VANCOCIN) IVPB 1000 mg/200 mL premix  Status:  Discontinued     1,000 mg 200 mL/hr over 60 Minutes Intravenous Every 24 hours 01/14/16 0723 01/17/16 0918   01/13/16 1830  vancomycin (VANCOCIN) IVPB 750 mg/150 ml premix  Status:  Discontinued     750 mg 150 mL/hr over 60 Minutes Intravenous  Once 01/13/16 1820 01/13/16 1821   01/13/16 1830  vancomycin (VANCOCIN) IVPB 1000 mg/200 mL premix  Status:  Discontinued     1,000 mg 200 mL/hr over 60 Minutes Intravenous Every 24 hours 01/13/16 1822 01/14/16 0723   01/12/16 2330  vancomycin (VANCOCIN) IVPB 1000 mg/200 mL premix  Status:  Discontinued     1,000 mg 200 mL/hr over 60 Minutes Intravenous Every 24 hours 01/11/16 2256 01/12/16 1519   01/12/16 0600  piperacillin-tazobactam (ZOSYN) IVPB 3.375 g  Status:  Discontinued     3.375 g 12.5 mL/hr over 240 Minutes Intravenous 3 times per day 01/11/16 2256 01/15/16 1237   01/11/16 2300  vancomycin (  VANCOCIN) IVPB 1000 mg/200 mL premix     1,000 mg 200 mL/hr over 60 Minutes Intravenous STAT 01/11/16 2254 01/12/16 0330   01/11/16 2100  vancomycin (VANCOCIN) IVPB 750  mg/150 ml premix  Status:  Discontinued     750 mg 150 mL/hr over 60 Minutes Intravenous  Once 01/11/16 2055 01/11/16 2254   01/11/16 2045  piperacillin-tazobactam (ZOSYN) IVPB 3.375 g     3.375 g 100 mL/hr over 30 Minutes Intravenous  Once 01/11/16 2044 01/12/16 0406   01/11/16 2045  vancomycin (VANCOCIN) IVPB 1000 mg/200 mL premix  Status:  Discontinued     1,000 mg 200 mL/hr over 60 Minutes Intravenous  Once 01/11/16 2044 01/11/16 2055      DVT Prophylaxis: Prophylactic Lovenox   Code Status: Full code   Family Communication None at bedside  Procedures: None  CONSULTS:  ID and Cardiothoracic surgery  Time spent 20 minutes-Greater than 50% of this time was spent in counseling, explanation of diagnosis, planning of further management, and coordination of care.  MEDICATIONS: Scheduled Meds: . enoxaparin (LOVENOX) injection  20 mg Subcutaneous Q24H  . pantoprazole  40 mg Oral Daily  . sodium chloride flush  3 mL Intravenous Q12H  . vancomycin  750 mg Intravenous Q12H   Continuous Infusions:  PRN Meds:.acetaminophen **OR** acetaminophen, benzonatate, HYDROcodone-acetaminophen, ipratropium-albuterol, ondansetron **OR** ondansetron (ZOFRAN) IV, sodium chloride flush, technetium TC 54M diethylenetriame-pentaacetic acid    PHYSICAL EXAM: Vital signs in last 24 hours: Filed Vitals:   01/16/16 0457 01/16/16 1500 01/16/16 2140 01/17/16 0429  BP: 104/69 119/77 127/80 107/72  Pulse: 77 77 77 80  Temp: 98.1 F (36.7 C) 97.8 F (36.6 C) 98 F (36.7 C) 98 F (36.7 C)  TempSrc: Oral Oral Oral Oral  Resp: 18 16 18 18   Height:      Weight:      SpO2: 99% 100% 96% 99%    Weight change:  Filed Weights   01/15/16 1926  Weight: 41.731 kg (92 lb)   Body mass index is 17.97 kg/(m^2).   Gen Exam: Awake and alert with clear speech.  Neck: Supple, No JVD.   Chest: B/L Clear.  Chest tube in place. CVS: S1 S2 Regular, no murmurs.  Abdomen: soft, BS +, non tender, non  distended.  Extremities: no edema, lower extremities warm to touch. Neurologic: Non Focal.   Skin: No Rash.   Wounds: N/A.    Intake/Output from previous day:  Intake/Output Summary (Last 24 hours) at 01/17/16 1045 Last data filed at 01/17/16 0803  Gross per 24 hour  Intake      0 ml  Output     70 ml  Net    -70 ml     LAB RESULTS: CBC  Recent Labs Lab 01/11/16 2135 01/12/16 0121 01/13/16 0256  WBC 6.9 5.7 4.7  HGB 12.8* 11.4* 12.3*  HCT 40.2 37.3* 41.5  PLT 308 323 285  MCV 85.2 84.8 87.4  MCH 27.1 25.9* 25.9*  MCHC 31.8 30.6 29.6*  RDW 15.1 14.8 15.0  LYMPHSABS 0.8 0.7 1.5  MONOABS 0.7 0.8 0.5  EOSABS 0.1 0.1 0.0  BASOSABS 0.0 0.0 0.0    Chemistries   Recent Labs Lab 01/11/16 2135 01/12/16 0121 01/12/16 0303 01/13/16 0256 01/14/16 0610 01/15/16 0235  NA 135  --  137 135 136 135  K 4.3  --  4.5 5.2* 3.8 3.7  CL 96*  --  97* 94* 90* 89*  CO2 33*  --  29 36* 37*  38*  GLUCOSE 106*  --  106* 120* 109* 98  BUN <5*  --  6 7 8 6   CREATININE 1.08 1.11 1.06 1.18 1.07 1.10  CALCIUM 8.5*  --  8.2* 8.1* 7.9* 8.1*  MG 2.0  --   --   --   --   --     CBG: No results for input(s): GLUCAP in the last 168 hours.  GFR Estimated Creatinine Clearance: 45.8 mL/min (by C-G formula based on Cr of 1.1).  Coagulation profile  Recent Labs Lab 01/11/16 2135  INR 1.12    Cardiac Enzymes No results for input(s): CKMB, TROPONINI, MYOGLOBIN in the last 168 hours.  Invalid input(s): CK  Invalid input(s): POCBNP No results for input(s): DDIMER in the last 72 hours. No results for input(s): HGBA1C in the last 72 hours. No results for input(s): CHOL, HDL, LDLCALC, TRIG, CHOLHDL, LDLDIRECT in the last 72 hours. No results for input(s): TSH, T4TOTAL, T3FREE, THYROIDAB in the last 72 hours.  Invalid input(s): FREET3 No results for input(s): VITAMINB12, FOLATE, FERRITIN, TIBC, IRON, RETICCTPCT in the last 72 hours. No results for input(s): LIPASE, AMYLASE in the last  72 hours.  Urine Studies No results for input(s): UHGB, CRYS in the last 72 hours.  Invalid input(s): UACOL, UAPR, USPG, UPH, UTP, UGL, UKET, UBIL, UNIT, UROB, ULEU, UEPI, UWBC, URBC, UBAC, CAST, UCOM, BILUA  MICROBIOLOGY: Recent Results (from the past 240 hour(s))  Culture, blood (x 2)     Status: None   Collection Time: 01/11/16  9:40 PM  Result Value Ref Range Status   Specimen Description BLOOD RIGHT ANTECUBITAL  Final   Special Requests BOTTLES DRAWN AEROBIC AND ANAEROBIC 10CC   Final   Culture NO GROWTH 5 DAYS  Final   Report Status 01/16/2016 FINAL  Final  Culture, blood (x 2)     Status: None   Collection Time: 01/11/16  9:40 PM  Result Value Ref Range Status   Specimen Description BLOOD LEFT ARM  Final   Special Requests BOTTLES DRAWN AEROBIC AND ANAEROBIC 5CC  Final   Culture NO GROWTH 5 DAYS  Final   Report Status 01/16/2016 FINAL  Final  Culture, body fluid-bottle     Status: None   Collection Time: 01/12/16  1:48 PM  Result Value Ref Range Status   Specimen Description FLUID LEFT PLEURAL  Final   Special Requests BOTTLES DRAWN AEROBIC AND ANAEROBIC 5CC  Final   Gram Stain   Final    GRAM POSITIVE COCCI IN CLUSTERS IN BOTH AEROBIC AND ANAEROBIC BOTTLES CRITICAL RESULT CALLED TO, READ BACK BY AND VERIFIED WITH: A BURTON 01/13/16 @ 1245 M VESTAL    Culture STAPHYLOCOCCUS SPECIES (COAGULASE NEGATIVE)  Final   Report Status 01/15/2016 FINAL  Final   Organism ID, Bacteria STAPHYLOCOCCUS SPECIES (COAGULASE NEGATIVE)  Final      Susceptibility   Staphylococcus species (coagulase negative) - MIC*    CIPROFLOXACIN <=0.5 SENSITIVE Sensitive     ERYTHROMYCIN >=8 RESISTANT Resistant     GENTAMICIN <=0.5 SENSITIVE Sensitive     OXACILLIN >=4 RESISTANT Resistant     TETRACYCLINE >=16 RESISTANT Resistant     VANCOMYCIN 1 SENSITIVE Sensitive     TRIMETH/SULFA <=10 SENSITIVE Sensitive     CLINDAMYCIN <=0.25 SENSITIVE Sensitive     RIFAMPIN <=0.5 SENSITIVE Sensitive      Inducible Clindamycin NEGATIVE Sensitive     * STAPHYLOCOCCUS SPECIES (COAGULASE NEGATIVE)  Gram stain     Status: None   Collection  Time: 01/12/16  1:48 PM  Result Value Ref Range Status   Specimen Description FLUID LEFT PLEURAL  Final   Special Requests NONE  Final   Gram Stain   Final    ABUNDANT WBC PRESENT,BOTH PMN AND MONONUCLEAR NO ORGANISMS SEEN    Report Status 01/12/2016 FINAL  Final    RADIOLOGY STUDIES/RESULTS: Dg Chest 1 View  01/12/2016  CLINICAL DATA:  Patient status post catheter placement within the left chest. EXAM: CHEST 1 VIEW COMPARISON:  Chest radiograph 01/11/2016 FINDINGS: Stable cardiac and mediastinal contours with marked enlargement of the main pulmonary artery. Unchanged scattered bilateral heterogeneous pulmonary opacities, likely scarring. Interval placement of left pleural drainage catheter into a loculated left hydropneumothorax. Regional skeleton is unremarkable. IMPRESSION: Interval placement left pigtail drainage catheter into loculated left hydropneumothorax. Electronically Signed   By: Lovey Newcomer M.D.   On: 01/12/2016 14:23   Nm Pulmonary Perf And Vent  01/15/2016  CLINICAL DATA:  Pulmonary hypertension. Possible chronic PE. Left-sided chest tube in place for empyema. EXAM: NUCLEAR MEDICINE VENTILATION - PERFUSION LUNG SCAN TECHNIQUE: Ventilation images were obtained in multiple projections using inhaled aerosol Tc-27m DTPA. Perfusion images were obtained in multiple projections after intravenous injection of Tc-1m MAA. RADIOPHARMACEUTICALS:  31.1 Technetium-41m DTPA aerosol inhalation and 4.1 Technetium-89m MAA IV COMPARISON:  Chest CT 12/14/2015 FINDINGS: Ventilation: Very limited ventilation examination. Central deposition of the radiopharmaceutical and limited visualization of lungs. Perfusion: Small left hemi thorax due to a large complex left pleural fluid collection. No definite segmental or subsegmental perfusion defects to suggest pulmonary embolism.  IMPRESSION: Limited examination due to the limited ventilation study. Small left hemi thorax due to a left pleural fluid collection. No findings suspicious for pulmonary embolism. Electronically Signed   By: Marijo Sanes M.D.   On: 01/15/2016 15:35   Dg Chest Port 1 View  01/17/2016  CLINICAL DATA:  Emphysema.  Chest tube. EXAM: PORTABLE CHEST 1 VIEW COMPARISON:  01/12/2016 FINDINGS: Stable left chest tube. Tiny pneumothorax at the left base improved. Pleural thickening and volume loss in the left hemi thorax is stable. Right lung is stable in appearance. There is opacity inferior to the right hilum and in the peripheral right lower lung zone. Small right pleural effusion is suspected. Right PICC placed. Tip is at the cavoatrial junction. IMPRESSION: Stable left chest tube.  Tiny left basilar pneumothorax is improved. Right upper extremity PICC place with its tip at the cavoatrial junction Otherwise stable study. Electronically Signed   By: Marybelle Killings M.D.   On: 01/17/2016 10:10   Dg Chest Port 1 View  01/11/2016  CLINICAL DATA:  Sepsis EXAM: PORTABLE CHEST 1 VIEW COMPARISON:  CT chest 12/14/2015.  Chest x-ray 11/23/2015 FINDINGS: Left pleural loculated fluid and pleural scarring unchanged from the prior study. Prior CT demonstrated air-fluid level consistent with hydro pneumothorax. This is difficult to see on the chest x-ray but probably remains. 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 Underlying chronic lung disease with COPD Marked pulmonary artery enlargement with pulmonary artery hypertension again noted. IMPRESSION: Loculated left pleural effusion and pleural scarring unchanged with probable hydro pneumothorax on the left as seen on the prior CT of 12/14/2015. Severe pulmonary artery hypertension. No new findings compared with earlier studies. Electronically Signed   By: Franchot Gallo M.D.   On: 01/11/2016 21:18   Ct Perc Pleural Drain W/indwell Cath W/img Guide  01/12/2016  INDICATION:  55 year old male with concern for recurrent left-sided empyema. CT-guided drainage is requested. EXAM: CT PERC PLEURAL  DRAIN W/INDWELL CATH W/IMG GUIDE MEDICATIONS: The patient is currently admitted to the hospital and receiving intravenous antibiotics. The antibiotics were administered within an appropriate time frame prior to the initiation of the procedure. ANESTHESIA/SEDATION: Fentanyl  mcg IV; Versed  mg IV Moderate Sedation Time: The patient was continuously monitored during the procedure by the interventional radiology nurse under my direct supervision. COMPLICATIONS: None immediate. Estimated blood loss: 0 PROCEDURE: Informed written consent was obtained from the patient after a thorough discussion of the procedural risks, benefits and alternatives. All questions were addressed. A timeout was performed prior to the initiation of the procedure. A planning axial CT scan was performed. A thick-walled loculated hydro pneumothorax is identified on the left. A suitable skin entry site was selected and marked. The region was sterilely prepped and draped in standard fashion using chlorhexidine skin prep. Local anesthesia was attained by infiltration with 1% lidocaine. A small dermatotomy was placed. Under intermittent CT guidance an 18 gauge trocar needle was advanced through the rib space and into the empyema. An Amplatz wire was then advanced through the trocar needle and directed toward the lung apex. The needle was removed. The skin tract was dilated to 47 [place] and a Cook 14 [place] all-purpose drainage catheter modified with multiple additional sideholes was advanced over the wire. The drain was connected to low wall suction via a pleura vac. Repeat CT imaging demonstrates excellent placement of the drain. All additional sideholes are within the pleural space and spanned the entirety of the empyema. Aspiration yields approximately 30 mL turbid bloody fluid. This was sent to the lab for culture. The tube was  secured to the skin with 0 Prolene suture. An air tight bandage was applied. The patient tolerated the procedure well. IMPRESSION: Successful placement of a 66 French all-purpose drainage catheter modified with additional sideholes into the thick walled left-sided hydro pneumothorax. The aspirated material appears to be bloody. Signed, Criselda Peaches, MD [facility] Rad[facility] Electronically Signed   By: Jacqulynn Cadet M.D.   On: 01/12/2016 14:32    Oren Binet, MD  [facility] Pager:[phone]  If 7PM-7AM, please contact night-coverage [email or web address] Password [email or web address] 01/17/2016, 10:44 AM   LOS: 6 days

## 2016-01-18 ENCOUNTER — Encounter (HOSPITAL_COMMUNITY): Payer: Self-pay | Admitting: Internal Medicine

## 2016-01-18 ENCOUNTER — Encounter (HOSPITAL_COMMUNITY)
Admission: AD | Disposition: A | Discharge status: Home Health | Payer: Self-pay | Source: Ambulatory Visit | Attending: Internal Medicine

## 2016-01-18 ENCOUNTER — Inpatient Hospital Stay (HOSPITAL_COMMUNITY): Payer: BLUE CROSS/BLUE SHIELD

## 2016-01-18 DIAGNOSIS — I272 Pulmonary hypertension, unspecified: Secondary | ICD-10-CM | POA: Insufficient documentation

## 2016-01-18 HISTORY — PX: CARDIAC CATHETERIZATION: SHX172

## 2016-01-18 LAB — CBC
HCT: 37 % — ABNORMAL LOW (ref 39.0–52.0)
HEMATOCRIT: 38.7 % — AB (ref 39.0–52.0)
HEMOGLOBIN: 11.7 g/dL — AB (ref 13.0–17.0)
Hemoglobin: 11.3 g/dL — ABNORMAL LOW (ref 13.0–17.0)
MCH: 26.1 pg (ref 26.0–34.0)
MCH: 25.9 pg — AB (ref 26.0–34.0)
MCHC: 30.5 g/dL (ref 30.0–36.0)
MCHC: 30.2 g/dL (ref 30.0–36.0)
MCV: 85.5 fL (ref 78.0–100.0)
MCV: 85.8 fL (ref 78.0–100.0)
PLATELETS: 265 10*3/uL (ref 150–400)
Platelets: 242 10*3/uL (ref 150–400)
RBC: 4.33 MIL/uL (ref 4.22–5.81)
RBC: 4.51 MIL/uL (ref 4.22–5.81)
RDW: 14.3 % (ref 11.5–15.5)
RDW: 14.4 % (ref 11.5–15.5)
WBC: 6.4 10*3/uL (ref 4.0–10.5)
WBC: 6.8 10*3/uL (ref 4.0–10.5)

## 2016-01-18 LAB — POCT I-STAT 3, ART BLOOD GAS (G3+)
Acid-Base Excess: 12 mmol/L — ABNORMAL HIGH (ref 0.0–2.0)
BICARBONATE: 41.8 meq/L — AB (ref 20.0–24.0)
O2 Saturation: 95 %
PCO2 ART: 78.3 mmHg — AB (ref 35.0–45.0)
PH ART: 7.336 — AB (ref 7.350–7.450)
TCO2: 44 mmol/L (ref 0–100)
pO2, Arterial: 86 mmHg (ref 80.0–100.0)

## 2016-01-18 LAB — BASIC METABOLIC PANEL
Anion gap: 6 (ref 5–15)
BUN: 6 mg/dL (ref 6–20)
CALCIUM: 8.2 mg/dL — AB (ref 8.9–10.3)
CHLORIDE: 92 mmol/L — AB (ref 101–111)
CO2: 39 mmol/L — ABNORMAL HIGH (ref 22–32)
CREATININE: 0.75 mg/dL (ref 0.61–1.24)
GFR calc non Af Amer: >60 mL/min (ref >60)
Glucose, Bld: 96 mg/dL (ref 65–99)
Potassium: 4 mmol/L (ref 3.5–5.1)
SODIUM: 137 mmol/L (ref 135–145)

## 2016-01-18 LAB — POCT I-STAT 3, VENOUS BLOOD GAS (G3P V)
Acid-Base Excess: 11 mmol/L — ABNORMAL HIGH (ref 0.0–2.0)
Acid-Base Excess: 12 mmol/L — ABNORMAL HIGH (ref 0.0–2.0)
BICARBONATE: 40.5 meq/L — AB (ref 20.0–24.0)
Bicarbonate: 41.3 mEq/L — ABNORMAL HIGH (ref 20.0–24.0)
O2 SAT: 68 %
O2 Saturation: 68 %
PCO2 VEN: 78.3 mmHg — AB (ref 45.0–50.0)
PH VEN: 7.333 — AB (ref 7.250–7.300)
PO2 VEN: 40 mmHg (ref 31.0–45.0)
PO2 VEN: 40 mmHg (ref 31.0–45.0)
TCO2: 43 mmol/L (ref 0–100)
TCO2: 44 mmol/L (ref 0–100)
pCO2, Ven: 76.3 mmHg (ref 45.0–50.0)
pH, Ven: 7.33 — ABNORMAL HIGH (ref 7.250–7.300)

## 2016-01-18 LAB — CREATININE, SERUM
CREATININE: 0.73 mg/dL (ref 0.61–1.24)
GFR calc non Af Amer: >60 mL/min (ref >60)

## 2016-01-18 SURGERY — RIGHT HEART CATH
Anesthesia: LOCAL

## 2016-01-18 MED ORDER — SODIUM CHLORIDE 0.9% FLUSH: 3.0000 mL | INTRAVENOUS | Status: DC | PRN

## 2016-01-18 MED ORDER — SODIUM CHLORIDE 0.9 % IV SOLN: 250.0000 mL | INTRAVENOUS | Status: DC | PRN

## 2016-01-18 MED ORDER — ONDANSETRON HCL 4 MG/2ML IJ SOLN: 4.0000 mg | Freq: Four times a day (QID) | INTRAMUSCULAR | Status: DC | PRN

## 2016-01-18 MED ORDER — VANCOMYCIN HCL IN DEXTROSE 750-5 MG/150ML-% IV SOLN
750.0000 mg | Freq: Two times a day (BID) | INTRAVENOUS | Status: DC
  Administered 2016-01-18 – 2016-01-23 (×11): 750 mg via INTRAVENOUS
  Filled 2016-01-18 (×12): qty 150

## 2016-01-18 MED ORDER — HEPARIN (PORCINE) IN NACL 2-0.9 UNIT/ML-% IJ SOLN
INTRAMUSCULAR | Status: DC | PRN
  Administered 2016-01-18: 500 mL

## 2016-01-18 MED ORDER — SODIUM CHLORIDE 0.9 % IV SOLN: INTRAVENOUS | Status: DC

## 2016-01-18 MED ORDER — LIDOCAINE HCL (PF) 1 % IJ SOLN
INTRAMUSCULAR | Status: DC | PRN
  Administered 2016-01-18: 9 mL via SUBCUTANEOUS

## 2016-01-18 MED ORDER — HEPARIN (PORCINE) IN NACL 2-0.9 UNIT/ML-% IJ SOLN
INTRAMUSCULAR | Status: AC
  Filled 2016-01-18: qty 500

## 2016-01-18 MED ORDER — SODIUM CHLORIDE 0.9% FLUSH: 3.0000 mL | Freq: Two times a day (BID) | INTRAVENOUS | Status: DC

## 2016-01-18 MED ORDER — ENOXAPARIN SODIUM 30 MG/0.3ML ~~LOC~~ SOLN
20.0000 mg | SUBCUTANEOUS | Status: DC
  Administered 2016-01-19 – 2016-01-23 (×5): 20 mg via SUBCUTANEOUS
  Filled 2016-01-18: qty 0.3
  Filled 2016-01-18 (×6): qty 0.2
  Filled 2016-01-18: qty 0.3

## 2016-01-18 MED ORDER — ACETAMINOPHEN 325 MG PO TABS: 650.0000 mg | ORAL_TABLET | ORAL | Status: DC | PRN

## 2016-01-18 MED ORDER — LIDOCAINE HCL (PF) 1 % IJ SOLN
INTRAMUSCULAR | Status: AC
  Filled 2016-01-18: qty 30

## 2016-01-18 SURGICAL SUPPLY — 7 items
CATH SWAN GANZ 7F STRAIGHT (CATHETERS) ×2 IMPLANT
KIT HEART RIGHT NAMIC (KITS) ×2 IMPLANT
PACK CARDIAC CATHETERIZATION (CUSTOM PROCEDURE TRAY) ×2 IMPLANT
SHEATH PINNACLE 7F 10CM (SHEATH) ×2 IMPLANT
TRANSDUCER W/STOPCOCK (MISCELLANEOUS) ×2 IMPLANT
TUBING ART PRESS 72  MALE/FEM (TUBING) ×1
TUBING ART PRESS 72 MALE/FEM (TUBING) ×1 IMPLANT

## 2016-01-18 NOTE — Progress Notes (Addendum)
      [address]       [place] [address]             [phone]    Subjective:  No new complaints.  For heart cath today.  Objective: Vital signs in last 24 hours: Temp:  [97.7 F (36.5 C)-98.2 F (36.8 C)] 97.7 F (36.5 C) (03/16 1010) Pulse Rate:  [62-80] 77 (03/16 1010) Cardiac Rhythm:  [-] Normal sinus rhythm (03/16 0730) Resp:  [13-29] 18 (03/16 1010) BP: (108-132)/(62-88) 108/62 mmHg (03/16 1400) SpO2:  [96 %-100 %] 100 % (03/16 1045) Weight:  [84 lb 7 oz (38.3 kg)] 84 lb 7 oz (38.3 kg) (03/15 1600)  Intake/Output from previous day: 03/15 0701 - 03/16 0700 In: 850 [P.O.:840; I.V.:10] Out: 120 [Chest Tube:120]  General appearance: alert, cooperative and no distress Heart: regular rate and rhythm Lungs: clear to auscultation bilaterally and some scattered wheezes Wound: clean and dry  Lab Results:  Recent Labs  01/18/16 0450 01/18/16 1030  WBC 6.4 6.8  HGB 11.3* 11.7*  HCT 37.0* 38.7*  PLT 265 242   BMET:  Recent Labs  01/18/16 0450 01/18/16 1030  NA 137  --   K 4.0  --   CL 92*  --   CO2 39*  --   GLUCOSE 96  --   BUN 6  --   CREATININE 0.75 0.73  CALCIUM 8.2*  --     PT/INR: No results for input(s): LABPROT, INR in the last 72 hours. ABG    Component Value Date/Time   PHART 7.331* 10/03/2015 0317   HCO3 31.4* 10/03/2015 0317   TCO2 33.2 10/03/2015 0317   O2SAT 99.3 10/03/2015 0317   CBG (last 3)  No results for input(s): GLUCAP in the last 72 hours.  Assessment/Plan: S/P Procedure(s) (LRB): Right Heart Cath (N/A)  1. Chest tube- continues to have air leak, CXR remains stable, 120cc output yesterday 2. ID- empyema, on ABX per ID 3. Dispo- care per primary   LOS: 7 days    BARRETT, ERIN 01/18/2016  Right heart cath today I have seen and examined [name] [name] and agree with the above assessment  and plan.  [name] MD Beeper [phone] Office [phone] 01/18/2016 5:46 PM

## 2016-01-18 NOTE — CV Procedure (Signed)
Findings:  RA = 4 RV = 54/3/7 PA = 59/24 (37) PCW = 9 Fick cardiac output/index = 4.1/3.2 PVR = 6.8 WU FA sat = 95% PA sat = 68%, 68%  Assessment: 1. Mild to moderate PAH 2. Normal left-sided filling pressures 3. Normal cardia output  Plan/Discussion:  He has mild to moderate PAH with elevated PVR. 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 Would assess need for home O2 and if he complies with home O2 can re-evaluate PA pressures in 3-6 months. If they remain elevated and he follows up can consider trial of selective pulmonary vasodilator like sildenafil.   [name], Daniel,MD 9:44 AM

## 2016-01-18 NOTE — H&P (View-Only) (Signed)
Advanced Heart Failure Team Consult Note  Referring Physician: Dr Servando Snare Primary Physician:  None on file Primary Cardiologist:    Reason for Consultation: PAH  HPI:    [name] [name] is a 55 y.o. [place] male with PMH of urolithiasis, HTN, and chronic empyema directly admitted to hospital from Dr Everrett Coombe office for recurrent empyema.  He has a history of remote lung decortication. He was recently admitted in 09/2015 for empyema, underwent a VATs procedure and chest tube placement and PICC line placement for outpatient IV ABX.  He was non compliant with Cardiothoracic surgery and other provider visits and continued to go to the ED for care, where his chest tube was eventually removed in 10/2015.  Pertinent admission labs include WBC 6.9, K 4.5, creatinine 1.06, Lactic acid 0.6. Had image guided placement of pigtail chest tube on the left by Dr Laurence Ferrari 01/12/16.   History slightly limited with English second language. He has had some hemoptysis and SOB. Continues to feel poorly overall.  Chest tube remains on suction. ID following for + methicillin resistant CN Staph. He states prior to admission he did have some DOE.  He has to take breaks walking around grocery store.  Denies SOB with ADLs just as bathing and changing clothes.  He has never smoked.  Denies lightheadedness or dizziness. Has non-specific chest pain, not related to activity or relieved by rest. Denies peripheral edema or orthopnea.  He does have bendopnea.   Echo 01/14/16 LVEF 0000000, septal diastolic and systolic flattening. Trivial MR, Mildly dilated RV with mild/mod reduced RV function. Mild TR, PA peak pressure 81 mm Hg.   Review of Systems: [y] = yes, [ ]  = no   General: Weight gain [ ] ; Weight loss [ ] ; Anorexia [ ] ; Fatigue [ ] ; Fever [y]; Chills [ ] ; Weakness [y]  Cardiac: Chest pain/pressure [ ] ; Resting SOB [ ] ; Exertional SOB [y]; Orthopnea [ ] ; Pedal Edema [ ] ; Palpitations [ ] ; Syncope [ ] ; Presyncope [ ] ;  Paroxysmal nocturnal dyspnea[ ]   Pulmonary: Cough [y]; Wheezing[ ] ; Hemoptysis[y]; Sputum [y]; Snoring [ ]   GI: Vomiting[ ] ; Dysphagia[ ] ; Melena[ ] ; Hematochezia [ ] ; Heartburn[ ] ; Abdominal pain [ ] ; Constipation [ ] ; Diarrhea [ ] ; BRBPR [ ]   GU: Hematuria[ ] ; Dysuria [ ] ; Nocturia[ ]   Vascular: Pain in legs with walking [ ] ; Pain in feet with lying flat [ ] ; Non-healing sores [ ] ; Stroke [ ] ; TIA [ ] ; Slurred speech [ ] ;  Neuro: Headaches[ ] ; Vertigo[ ] ; Seizures[ ] ; Paresthesias[ ] ;Blurred vision [ ] ; Diplopia [ ] ; Vision changes [ ]   Ortho/Skin: Arthritis [ ] ; Joint pain [ ] ; Muscle pain [ ] ; Joint swelling [ ] ; Back Pain [ ] ; Rash [ ]   Psych: Depression[ ] ; Anxiety[ ]   Heme: Bleeding problems [ ] ; Clotting disorders [ ] ; Anemia [ ]   Endocrine: Diabetes [ ] ; Thyroid dysfunction[ ]   Home Medications Prior to Admission medications   Not on File    Past Medical History: Past Medical History  Diagnosis Date  . Kidney stones   . Hypertension     Past Surgical History: Past Surgical History  Procedure Laterality Date  . Kidney surgery    . Video assisted thoracoscopy (vats)/decortication Left 2003  . Video assisted thoracoscopy (vats)/decortication Left 10/02/2015    Procedure: VIDEO ASSISTED THORACOSCOPY (VATS)/DECORTICATION and drainage of chronic empyena;  Surgeon: [name], MD;  Location: [facility];  Service: Thoracic;  Laterality: Left;  . Video bronchoscopy N/A 10/02/2015  Procedure: VIDEO BRONCHOSCOPY;  Surgeon: [name], MD;  Location: [facility];  Service: Thoracic;  Laterality: N/A;    Family History: History reviewed. No pertinent family history.  Social History: Social History   Social History  . Marital Status: Single    Spouse Name: N/A  . Number of Children: N/A  . Years of Education: N/A   Social History Main Topics  . Smoking status: Never Smoker   . Smokeless tobacco: None  . Alcohol Use: Yes  . Drug Use: No  . Sexual Activity: Not Asked    Other Topics Concern  . None   Social History Narrative    Allergies:  No Known Allergies  Objective:    Vital Signs:   Temp:  [97.8 F (36.6 C)-98 F (36.7 C)] 98 F (36.7 C) (03/15 0429) Pulse Rate:  [77-80] 80 (03/15 0429) Resp:  [16-18] 18 (03/15 0429) BP: (107-127)/(72-80) 107/72 mmHg (03/15 0429) SpO2:  [96 %-100 %] 99 % (03/15 0429) Last BM Date: 01/16/16  Weight change: Filed Weights   01/15/16 1926  Weight: 92 lb (41.731 kg)    Intake/Output:   Intake/Output Summary (Last 24 hours) at 01/17/16 1100 Last data filed at 01/17/16 0803  Gross per 24 hour  Intake      0 ml  Output     70 ml  Net    -70 ml     Physical Exam: General:  Thin appearing, almost cachectic. NAD HEENT: normal Neck: supple. JVP 6-7. Carotids 2+ bilat; no bruits. No thyromegaly or nodule noted. Cor: PMI nondisplaced. Regular rate & rhythm. No rubs, gallops or murmurs. Lungs: Course sounds worse on L. Diminished bases. Abdomen: soft, NT, ND, no HSM. No bruits or masses. +BS  Extremities: no cyanosis,  rash, edema +clubbing Neuro: alert & orientedx3, cranial nerves grossly intact. moves all 4 extremities w/o difficulty. Affect pleasant  Telemetry: Reviewed, NSR 70s  Labs: Basic Metabolic Panel:  Recent Labs Lab 01/11/16 2135 01/12/16 0121 01/12/16 0303 01/13/16 0256 01/14/16 0610 01/15/16 0235  NA 135  --  137 135 136 135  K 4.3  --  4.5 5.2* 3.8 3.7  CL 96*  --  97* 94* 90* 89*  CO2 33*  --  29 36* 37* 38*  GLUCOSE 106*  --  106* 120* 109* 98  BUN <5*  --  6 7 8 6   CREATININE 1.08 1.11 1.06 1.18 1.07 1.10  CALCIUM 8.5*  --  8.2* 8.1* 7.9* 8.1*  MG 2.0  --   --   --   --   --   PHOS 3.1  --   --   --   --   --     Liver Function Tests:  Recent Labs Lab 01/11/16 2135 01/12/16 0303 01/13/16 0256 01/14/16 0610 01/15/16 0235  AST 32 26 28 26 24   ALT 18 17 17  15* 14*  ALKPHOS 109 97 86 66 60  BILITOT 0.4 0.4 0.1* 0.3 0.2*  PROT 8.8* 7.7 8.3* 6.9 7.2   ALBUMIN 2.9* 2.6* 2.5* 2.3* 2.3*   No results for input(s): LIPASE, AMYLASE in the last 168 hours. No results for input(s): AMMONIA in the last 168 hours.  CBC:  Recent Labs Lab 01/11/16 2135 01/12/16 0121 01/13/16 0256  WBC 6.9 5.7 4.7  NEUTROABS 5.3 4.1 2.7  HGB 12.8* 11.4* 12.3*  HCT 40.2 37.3* 41.5  MCV 85.2 84.8 87.4  PLT 308 323 285    Cardiac Enzymes: No results for input(s): CKTOTAL,  CKMB, CKMBINDEX, TROPONINI in the last 168 hours.  BNP: BNP (last 3 results) No results for input(s): BNP in the last 8760 hours.  ProBNP (last 3 results) No results for input(s): PROBNP in the last 8760 hours.   CBG: No results for input(s): GLUCAP in the last 168 hours.  Coagulation Studies: No results for input(s): LABPROT, INR in the last 72 hours.  Other results: EKG: 01/12/16 NSR 80s R-axis with RV strain v1,v2 Imaging: Nm Pulmonary Perf And Vent  01/15/2016  CLINICAL DATA:  Pulmonary hypertension. Possible chronic PE. Left-sided chest tube in place for empyema. EXAM: NUCLEAR MEDICINE VENTILATION - PERFUSION LUNG SCAN TECHNIQUE: Ventilation images were obtained in multiple projections using inhaled aerosol Tc-59m DTPA. Perfusion images were obtained in multiple projections after intravenous injection of Tc-74m MAA. RADIOPHARMACEUTICALS:  31.1 Technetium-64m DTPA aerosol inhalation and 4.1 Technetium-51m MAA IV COMPARISON:  Chest CT 12/14/2015 FINDINGS: Ventilation: Very limited ventilation examination. Central deposition of the radiopharmaceutical and limited visualization of lungs. Perfusion: Small left hemi thorax due to a large complex left pleural fluid collection. No definite segmental or subsegmental perfusion defects to suggest pulmonary embolism. IMPRESSION: Limited examination due to the limited ventilation study. Small left hemi thorax due to a left pleural fluid collection. No findings suspicious for pulmonary embolism. Electronically Signed   By: Marijo Sanes M.D.    On: 01/15/2016 15:35   Dg Chest Port 1 View  01/17/2016  CLINICAL DATA:  Emphysema.  Chest tube. EXAM: PORTABLE CHEST 1 VIEW COMPARISON:  01/12/2016 FINDINGS: Stable left chest tube. Tiny pneumothorax at the left base improved. Pleural thickening and volume loss in the left hemi thorax is stable. Right lung is stable in appearance. There is opacity inferior to the right hilum and in the peripheral right lower lung zone. Small right pleural effusion is suspected. Right PICC placed. Tip is at the cavoatrial junction. IMPRESSION: Stable left chest tube.  Tiny left basilar pneumothorax is improved. Right upper extremity PICC place with its tip at the cavoatrial junction Otherwise stable study. Electronically Signed   By: Marybelle Killings M.D.   On: 01/17/2016 10:10      Medications:     Current Medications: . enoxaparin (LOVENOX) injection  20 mg Subcutaneous Q24H  . pantoprazole  40 mg Oral Daily  . sodium chloride flush  3 mL Intravenous Q12H  . vancomycin  750 mg Intravenous Q12H     Infusions:      Assessment/Plan   1. Pulmonary HTN with cor pulmonale - PA pressure 81 mm HG by echo. -  ANA negative, ANCA negative, RF negative.  - Recent HIV/HCV test negative - VQ scan low probability. - LE dopplers negative for DVT.  - On schedule for RHC tomorrow AM now that he is no longer febrile 2. Recurrent left-sided empyema with sepsis - Per primary and Dr Servando Snare. 3. Hyperkalemia - Resolved. Follow electrolytes closely.   Length of Stay: 6  Shirley Friar PA-C 01/17/2016, 11:00 AM  Advanced Heart Failure Team Pager [phone] (M-F; 7a - 4p)  Please contact [facility] for night-coverage after hours (4p -7a ) and weekends on [email or web address]  Patient seen and examined with Oda Kilts, PA-C. We discussed all aspects of the encounter. I agree with the assessment and plan as stated above.   He has evidence of PAH of unknown etiology. 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 Will  plan RHC in am. Will need assessment for home O2 prior to d/c.   Emmitte Surgeon,MD 4:35 PM

## 2016-01-18 NOTE — Progress Notes (Signed)
PATIENT DETAILS Name: [name] [name] Age: 55 y.o. Sex: male Date of Birth: 17-Oct-1961 Admit Date: 01/11/2016 Admitting Physician Geradine Girt, DO PCP:No primary care provider on file.  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 Underwent chest tube placement by IR, total foot cultures positive for coagulase negative Staphylococcus. Infectious disease recommending IV vancomycin to 4/4-1 patient will be seen at the ID clinic. Cardiothoracic surgery following-currently still with chest tube in place. Separately, patient also has been experiencing exertional dyspnea that has been gradually worsening over the past few years, he claims that even walking 20 yards will now make him short of breath. Echocardiogram this admission shows a PA pressure of 81 mmHg-underwent RHC which showed PA = 59/24 (37). See below for further details.  Subjective: Continues to feel weak.   Assessment/Plan: Principal Problem: SIRS (systemic inflammatory response syndrome): SIRS pathophysiology resolved, Now afebrile. Secondary to recurrent left-sided empyema. Blood cultures negative, pleural fluid culture positive for coag neg staph-continue empiric vancomycin. ID consulted, recommendations are to continue vancomycin at least until 4/4-when patient has follow-up at the ID clinic, further decision to prolong or discontinue antibiotics will be made on that visit.PICC line placed on 3/14.  Active Problems: Recurrent left-sided empyema: Seen by cardiothoracic surgery, underwent CT-guided chest tube placement on 3/10. Pleural fluid cultures positive for coag neg staph-continue empiric vancomycin.[facility]/IR following-and managing chest tube. Defer chest tube management to cardiothoracic surgery and IR.  Hyperkalemia: Resolved. Follow  electrolytes periodically  Pulmonary hypertension: Echocardiogram confirms severe Pul HTN-PA pressure of 54mm Hg. RA,ANA, ANCA serology negative. Recent HIV test was negative as well. VQ scan low probability for PE, lower extremity Dopplers negative for DVT. CHF team consulted-underwent RHC on 3/16 which showed PA = 59/24 (37).Recommendations are to Would assess need for home O2 and if he complies with home O2 can re-evaluate PA pressures in 3-6 months. If they remain elevated and he follows up can consider trial of selective pulmonary vasodilator like sildenafil.   Disposition: Remain inpatient-home once cleared by [facility]  Antimicrobial agents  See below  Anti-infectives    Start     Dose/Rate Route Frequency Ordered Stop   01/18/16 1100  vancomycin (VANCOCIN) IVPB 750 mg/150 ml premix     750 mg 150 mL/hr over 60 Minutes Intravenous Every 12 hours 01/18/16 1032     01/17/16 1000  vancomycin (VANCOCIN) IVPB 750 mg/150 ml premix  Status:  Discontinued     750 mg 150 mL/hr over 60 Minutes Intravenous Every 12 hours 01/17/16 0918 01/18/16 1032   01/14/16 0800  vancomycin (VANCOCIN) IVPB 1000 mg/200 mL premix  Status:  Discontinued     1,000 mg 200 mL/hr over 60 Minutes Intravenous Every 24 hours 01/14/16 0723 01/17/16 0918   01/13/16 1830  vancomycin (VANCOCIN) IVPB 750 mg/150 ml premix  Status:  Discontinued     750 mg 150 mL/hr over 60 Minutes Intravenous  Once 01/13/16 1820 01/13/16 1821   01/13/16 1830  vancomycin (VANCOCIN) IVPB 1000 mg/200 mL premix  Status:  Discontinued     1,000 mg 200 mL/hr over 60 Minutes Intravenous Every 24 hours 01/13/16 1822 01/14/16 0723   01/12/16 2330  vancomycin (VANCOCIN) IVPB 1000 mg/200 mL premix  Status:  Discontinued     1,000 mg 200 mL/hr over  60 Minutes Intravenous Every 24 hours 01/11/16 2256 01/12/16 1519   01/12/16 0600  piperacillin-tazobactam (ZOSYN) IVPB 3.375 g  Status:  Discontinued     3.375 g 12.5 mL/hr over 240 Minutes Intravenous 3  times per day 01/11/16 2256 01/15/16 1237   01/11/16 2300  vancomycin (VANCOCIN) IVPB 1000 mg/200 mL premix     1,000 mg 200 mL/hr over 60 Minutes Intravenous STAT 01/11/16 2254 01/12/16 0330   01/11/16 2100  vancomycin (VANCOCIN) IVPB 750 mg/150 ml premix  Status:  Discontinued     750 mg 150 mL/hr over 60 Minutes Intravenous  Once 01/11/16 2055 01/11/16 2254   01/11/16 2045  piperacillin-tazobactam (ZOSYN) IVPB 3.375 g     3.375 g 100 mL/hr over 30 Minutes Intravenous  Once 01/11/16 2044 01/12/16 0406   01/11/16 2045  vancomycin (VANCOCIN) IVPB 1000 mg/200 mL premix  Status:  Discontinued     1,000 mg 200 mL/hr over 60 Minutes Intravenous  Once 01/11/16 2044 01/11/16 2055      DVT Prophylaxis: Prophylactic Lovenox   Code Status: Full code   Family Communication None at bedside  Procedures: None  CONSULTS:  ID and Cardiothoracic surgery  Time spent 25 minutes-Greater than 50% of this time was spent in counseling, explanation of diagnosis, planning of further management, and coordination of care.  MEDICATIONS: Scheduled Meds: . [START ON 01/19/2016] enoxaparin (LOVENOX) injection  20 mg Subcutaneous Q24H  . pantoprazole  40 mg Oral Daily  . sodium chloride flush  3 mL Intravenous Q12H  . sodium chloride flush  3 mL Intravenous Q12H  . sodium chloride flush  3 mL Intravenous Q12H  . vancomycin  750 mg Intravenous Q12H   Continuous Infusions:  PRN Meds:.sodium chloride, sodium chloride, [DISCONTINUED] acetaminophen **OR** acetaminophen, acetaminophen, benzonatate, HYDROcodone-acetaminophen, ipratropium-albuterol, ondansetron (ZOFRAN) IV, ondansetron **OR** [DISCONTINUED] ondansetron (ZOFRAN) IV, sodium chloride flush, sodium chloride flush, sodium chloride flush, technetium TC 33M diethylenetriame-pentaacetic acid    PHYSICAL EXAM: Vital signs in last 24 hours: Filed Vitals:   01/18/16 1030 01/18/16 1045 01/18/16 1100 01/18/16 1200  BP: 108/84 115/71 108/72 123/83    Pulse:      Temp:      TempSrc:      Resp:      Height:      Weight:      SpO2: 100% 100%      Weight change:  Filed Weights   01/15/16 1926 01/17/16 1600  Weight: 41.731 kg (92 lb) 38.3 kg (84 lb 7 oz)   Body mass index is 16.49 kg/(m^2).   Gen Exam: Awake and alert with clear speech.  Neck: Supple, No JVD.   Chest: B/L Clear.  Chest tube in place. CVS: S1 S2 Regular, no murmurs.  Abdomen: soft, BS +, non tender, non distended.  Extremities: no edema, lower extremities warm to touch. Neurologic: Non Focal.   Skin: No Rash.   Wounds: N/A.    Intake/Output from previous day:  Intake/Output Summary (Last 24 hours) at 01/18/16 1255 Last data filed at 01/18/16 0658  Gross per 24 hour  Intake    490 ml  Output     50 ml  Net    440 ml     LAB RESULTS: CBC  Recent Labs Lab 01/11/16 2135 01/12/16 0121 01/13/16 0256 01/18/16 0450 01/18/16 1030  WBC 6.9 5.7 4.7 6.4 6.8  HGB 12.8* 11.4* 12.3* 11.3* 11.7*  HCT 40.2 37.3* 41.5 37.0* 38.7*  PLT 308 323 285 265 242  MCV 85.2 84.8  87.4 85.5 85.8  MCH 27.1 25.9* 25.9* 26.1 25.9*  MCHC 31.8 30.6 29.6* 30.5 30.2  RDW 15.1 14.8 15.0 14.3 14.4  LYMPHSABS 0.8 0.7 1.5  --   --   MONOABS 0.7 0.8 0.5  --   --   EOSABS 0.1 0.1 0.0  --   --   BASOSABS 0.0 0.0 0.0  --   --     Chemistries   Recent Labs Lab 01/11/16 2135  01/12/16 0303 01/13/16 0256 01/14/16 0610 01/15/16 0235 01/18/16 0450 01/18/16 1030  NA 135  --  137 135 136 135 137  --   K 4.3  --  4.5 5.2* 3.8 3.7 4.0  --   CL 96*  --  97* 94* 90* 89* 92*  --   CO2 33*  --  29 36* 37* 38* 39*  --   GLUCOSE 106*  --  106* 120* 109* 98 96  --   BUN <5*  --  6 7 8 6 6   --   CREATININE 1.08  < > 1.06 1.18 1.07 1.10 0.75 0.73  CALCIUM 8.5*  --  8.2* 8.1* 7.9* 8.1* 8.2*  --   MG 2.0  --   --   --   --   --   --   --   < > = values in this interval not displayed.  CBG: No results for input(s): GLUCAP in the last 168 hours.  GFR Estimated Creatinine  Clearance: 57.8 mL/min (by C-G formula based on Cr of 0.73).  Coagulation profile  Recent Labs Lab 01/11/16 2135  INR 1.12    Cardiac Enzymes No results for input(s): CKMB, TROPONINI, MYOGLOBIN in the last 168 hours.  Invalid input(s): CK  Invalid input(s): POCBNP No results for input(s): DDIMER in the last 72 hours. No results for input(s): HGBA1C in the last 72 hours. No results for input(s): CHOL, HDL, LDLCALC, TRIG, CHOLHDL, LDLDIRECT in the last 72 hours. No results for input(s): TSH, T4TOTAL, T3FREE, THYROIDAB in the last 72 hours.  Invalid input(s): FREET3 No results for input(s): VITAMINB12, FOLATE, FERRITIN, TIBC, IRON, RETICCTPCT in the last 72 hours. No results for input(s): LIPASE, AMYLASE in the last 72 hours.  Urine Studies No results for input(s): UHGB, CRYS in the last 72 hours.  Invalid input(s): UACOL, UAPR, USPG, UPH, UTP, UGL, UKET, UBIL, UNIT, UROB, ULEU, UEPI, UWBC, URBC, UBAC, CAST, UCOM, BILUA  MICROBIOLOGY: Recent Results (from the past 240 hour(s))  Culture, blood (x 2)     Status: None   Collection Time: 01/11/16  9:40 PM  Result Value Ref Range Status   Specimen Description BLOOD RIGHT ANTECUBITAL  Final   Special Requests BOTTLES DRAWN AEROBIC AND ANAEROBIC 10CC   Final   Culture NO GROWTH 5 DAYS  Final   Report Status 01/16/2016 FINAL  Final  Culture, blood (x 2)     Status: None   Collection Time: 01/11/16  9:40 PM  Result Value Ref Range Status   Specimen Description BLOOD LEFT ARM  Final   Special Requests BOTTLES DRAWN AEROBIC AND ANAEROBIC 5CC  Final   Culture NO GROWTH 5 DAYS  Final   Report Status 01/16/2016 FINAL  Final  Culture, body fluid-bottle     Status: None   Collection Time: 01/12/16  1:48 PM  Result Value Ref Range Status   Specimen Description FLUID LEFT PLEURAL  Final   Special Requests BOTTLES DRAWN AEROBIC AND ANAEROBIC 5CC  Final   Gram Stain  Final    GRAM POSITIVE COCCI IN CLUSTERS IN BOTH AEROBIC AND  ANAEROBIC BOTTLES CRITICAL RESULT CALLED TO, READ BACK BY AND VERIFIED WITH: A BURTON 01/13/16 @ 77 M VESTAL    Culture STAPHYLOCOCCUS SPECIES (COAGULASE NEGATIVE)  Final   Report Status 01/15/2016 FINAL  Final   Organism ID, Bacteria STAPHYLOCOCCUS SPECIES (COAGULASE NEGATIVE)  Final      Susceptibility   Staphylococcus species (coagulase negative) - MIC*    CIPROFLOXACIN <=0.5 SENSITIVE Sensitive     ERYTHROMYCIN >=8 RESISTANT Resistant     GENTAMICIN <=0.5 SENSITIVE Sensitive     OXACILLIN >=4 RESISTANT Resistant     TETRACYCLINE >=16 RESISTANT Resistant     VANCOMYCIN 1 SENSITIVE Sensitive     TRIMETH/SULFA <=10 SENSITIVE Sensitive     CLINDAMYCIN <=0.25 SENSITIVE Sensitive     RIFAMPIN <=0.5 SENSITIVE Sensitive     Inducible Clindamycin NEGATIVE Sensitive     * STAPHYLOCOCCUS SPECIES (COAGULASE NEGATIVE)  Gram stain     Status: None   Collection Time: 01/12/16  1:48 PM  Result Value Ref Range Status   Specimen Description FLUID LEFT PLEURAL  Final   Special Requests NONE  Final   Gram Stain   Final    ABUNDANT WBC PRESENT,BOTH PMN AND MONONUCLEAR NO ORGANISMS SEEN    Report Status 01/12/2016 FINAL  Final    RADIOLOGY STUDIES/RESULTS: Dg Chest 1 View  01/12/2016  CLINICAL DATA:  Patient status post catheter placement within the left chest. EXAM: CHEST 1 VIEW COMPARISON:  Chest radiograph 01/11/2016 FINDINGS: Stable cardiac and mediastinal contours with marked enlargement of the main pulmonary artery. Unchanged scattered bilateral heterogeneous pulmonary opacities, likely scarring. Interval placement of left pleural drainage catheter into a loculated left hydropneumothorax. Regional skeleton is unremarkable. IMPRESSION: Interval placement left pigtail drainage catheter into loculated left hydropneumothorax. Electronically Signed   By: Lovey Newcomer M.D.   On: 01/12/2016 14:23   Nm Pulmonary Perf And Vent  01/15/2016  CLINICAL DATA:  Pulmonary hypertension. Possible chronic PE.  Left-sided chest tube in place for empyema. EXAM: NUCLEAR MEDICINE VENTILATION - PERFUSION LUNG SCAN TECHNIQUE: Ventilation images were obtained in multiple projections using inhaled aerosol Tc-58m DTPA. Perfusion images were obtained in multiple projections after intravenous injection of Tc-61m MAA. RADIOPHARMACEUTICALS:  31.1 Technetium-17m DTPA aerosol inhalation and 4.1 Technetium-23m MAA IV COMPARISON:  Chest CT 12/14/2015 FINDINGS: Ventilation: Very limited ventilation examination. Central deposition of the radiopharmaceutical and limited visualization of lungs. Perfusion: Small left hemi thorax due to a large complex left pleural fluid collection. No definite segmental or subsegmental perfusion defects to suggest pulmonary embolism. IMPRESSION: Limited examination due to the limited ventilation study. Small left hemi thorax due to a left pleural fluid collection. No findings suspicious for pulmonary embolism. Electronically Signed   By: Marijo Sanes M.D.   On: 01/15/2016 15:35   Dg Chest Port 1 View  01/18/2016  CLINICAL DATA:  Empyema with chest tube. EXAM: PORTABLE CHEST 1 VIEW COMPARISON:  01/17/2016 FINDINGS: Right-sided PICC line unchanged with tip just below the cavoatrial junction. Left-sided chest tube unchanged. Lungs are adequately inflated. Stable to slight worsening opacification of the left base and lateral mid to lower left lung compatible with known pleural fluid collection/empyema. Vertical sliver of air over the lateral left base improved and thought to represent improving component of pleural air. Cardiomediastinal silhouette and remainder the exam is unchanged to include a small amount right pleural fluid as well as prominent bilateral pulmonary arteries. IMPRESSION: Stable to slightly worse left  base opacification compatible with known pleural fluid collection/empyema. Improved sliver of pleural air in the lateral left base. Left-sided chest tube unchanged. Stable small right pleural  effusion. Tubes and lines as described. Electronically Signed   By: Marin Olp M.D.   On: 01/18/2016 08:17   Dg Chest Port 1 View  01/17/2016  CLINICAL DATA:  Emphysema.  Chest tube. EXAM: PORTABLE CHEST 1 VIEW COMPARISON:  01/12/2016 FINDINGS: Stable left chest tube. Tiny pneumothorax at the left base improved. Pleural thickening and volume loss in the left hemi thorax is stable. Right lung is stable in appearance. There is opacity inferior to the right hilum and in the peripheral right lower lung zone. Small right pleural effusion is suspected. Right PICC placed. Tip is at the cavoatrial junction. IMPRESSION: Stable left chest tube.  Tiny left basilar pneumothorax is improved. Right upper extremity PICC place with its tip at the cavoatrial junction Otherwise stable study. Electronically Signed   By: Marybelle Killings M.D.   On: 01/17/2016 10:10   Dg Chest Port 1 View  01/11/2016  CLINICAL DATA:  Sepsis EXAM: PORTABLE CHEST 1 VIEW COMPARISON:  CT chest 12/14/2015.  Chest x-ray 11/23/2015 FINDINGS: Left pleural loculated fluid and pleural scarring unchanged from the prior study. Prior CT demonstrated air-fluid level consistent with hydro pneumothorax. This is difficult to see on the chest x-ray but probably remains. 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 Underlying chronic lung disease with COPD Marked pulmonary artery enlargement with pulmonary artery hypertension again noted. IMPRESSION: Loculated left pleural effusion and pleural scarring unchanged with probable hydro pneumothorax on the left as seen on the prior CT of 12/14/2015. Severe pulmonary artery hypertension. No new findings compared with earlier studies. Electronically Signed   By: Franchot Gallo M.D.   On: 01/11/2016 21:18   Ct Perc Pleural Drain W/indwell Cath W/img Guide  01/12/2016  INDICATION: 55 year old male with concern for recurrent left-sided empyema. CT-guided drainage is requested. EXAM: CT PERC PLEURAL DRAIN W/INDWELL CATH  W/IMG GUIDE MEDICATIONS: The patient is currently admitted to the hospital and receiving intravenous antibiotics. The antibiotics were administered within an appropriate time frame prior to the initiation of the procedure. ANESTHESIA/SEDATION: Fentanyl  mcg IV; Versed  mg IV Moderate Sedation Time: The patient was continuously monitored during the procedure by the interventional radiology nurse under my direct supervision. COMPLICATIONS: None immediate. Estimated blood loss: 0 PROCEDURE: Informed written consent was obtained from the patient after a thorough discussion of the procedural risks, benefits and alternatives. All questions were addressed. A timeout was performed prior to the initiation of the procedure. A planning axial CT scan was performed. A thick-walled loculated hydro pneumothorax is identified on the left. A suitable skin entry site was selected and marked. The region was sterilely prepped and draped in standard fashion using chlorhexidine skin prep. Local anesthesia was attained by infiltration with 1% lidocaine. A small dermatotomy was placed. Under intermittent CT guidance an 18 gauge trocar needle was advanced through the rib space and into the empyema. An Amplatz wire was then advanced through the trocar needle and directed toward the lung apex. The needle was removed. The skin tract was dilated to 66 [place] and a Cook 14 [place] all-purpose drainage catheter modified with multiple additional sideholes was advanced over the wire. The drain was connected to low wall suction via a pleura vac. Repeat CT imaging demonstrates excellent placement of the drain. All additional sideholes are within the pleural space and spanned the entirety of the empyema. Aspiration yields  approximately 30 mL turbid bloody fluid. This was sent to the lab for culture. The tube was secured to the skin with 0 Prolene suture. An air tight bandage was applied. The patient tolerated the procedure well. IMPRESSION: Successful  placement of a 66 French all-purpose drainage catheter modified with additional sideholes into the thick walled left-sided hydro pneumothorax. The aspirated material appears to be bloody. Signed, Criselda Peaches, MD [facility] Electronically Signed   By: Jacqulynn Cadet M.D.   On: 01/12/2016 14:32    Oren Binet, MD  [facility] Pager:[phone]  If 7PM-7AM, please contact night-coverage [email or web address] Password [email or web address] 01/18/2016, 12:55 PM   LOS: 7 days

## 2016-01-18 NOTE — Interval H&P Note (Signed)
History and Physical Interval Note:  01/18/2016 9:19 AM  [name] [name]  has presented today for surgery, with the diagnosis of pulmonary hypertension  The various methods of treatment have been discussed with the patient and family. After consideration of risks, benefits and other options for treatment, the patient has consented to  Procedure(s): Right Heart Cath (N/A) as a surgical intervention .  The patient's history has been reviewed, patient examined, no change in status, stable for surgery.  I have reviewed the patient's chart and labs.  Questions were answered to the patient's satisfaction.     Bensimhon, Quillian Quince

## 2016-01-18 NOTE — Progress Notes (Signed)
Utilization review completed.  

## 2016-01-19 LAB — VANCOMYCIN, TROUGH: VANCOMYCIN TR: 15 ug/mL (ref 10.0–20.0)

## 2016-01-19 MED ORDER — SALINE SPRAY 0.65 % NA SOLN
1.0000 | NASAL | Status: DC | PRN
  Filled 2016-01-19: qty 44

## 2016-01-19 NOTE — Progress Notes (Signed)
Referring Physician(s): Dr. Ceasar Mons  Supervising Physician: Arne Cleveland  Chief Complaint: (L)chest empyema  Subjective: (L)empyema s/p perc pigtail chest drain 3/15 Pt feels ok, no new c/o  Allergies: Review of patient's allergies indicates no known allergies.  Medications:  Current facility-administered medications:  .  0.9 %  sodium chloride infusion, 250 mL, Intravenous, PRN, Shaune Pascal Bensimhon, MD .  0.9 %  sodium chloride infusion, 250 mL, Intravenous, PRN, Jolaine Artist, MD .  [DISCONTINUED] acetaminophen (TYLENOL) tablet 650 mg, 650 mg, Oral, Q6H PRN, 650 mg at 01/13/16 2045 **OR** acetaminophen (TYLENOL) suppository 650 mg, 650 mg, Rectal, Q6H PRN, Reubin Milan, MD .  acetaminophen (TYLENOL) tablet 650 mg, 650 mg, Oral, Q4H PRN, Jolaine Artist, MD .  benzonatate (TESSALON) capsule 200 mg, 200 mg, Oral, TID PRN, Reubin Milan, MD, 200 mg at 01/18/16 2130 .  enoxaparin (LOVENOX) injection 20 mg, 20 mg, Subcutaneous, Q24H, Jolaine Artist, MD, 20 mg at 01/19/16 1023 .  HYDROcodone-acetaminophen (NORCO/VICODIN) 5-325 MG per tablet 1 tablet, 1 tablet, Oral, Q6H PRN, Reubin Milan, MD, 1 tablet at 01/18/16 2127 .  ipratropium-albuterol (DUONEB) 0.5-2.5 (3) MG/3ML nebulizer solution 3 mL, 3 mL, Nebulization, Q6H PRN, Reubin Milan, MD .  ondansetron [facility]) injection 4 mg, 4 mg, Intravenous, Q6H PRN, Shaune Pascal Bensimhon, MD .  ondansetron (ZOFRAN) tablet 4 mg, 4 mg, Oral, Q6H PRN **OR** [DISCONTINUED] ondansetron (ZOFRAN) injection 4 mg, 4 mg, Intravenous, Q6H PRN, Reubin Milan, MD .  pantoprazole (PROTONIX) EC tablet 40 mg, 40 mg, Oral, Daily, Reubin Milan, MD, 40 mg at 01/19/16 1023 .  sodium chloride flush (NS) 0.9 % injection 10-40 mL, 10-40 mL, Intracatheter, PRN, Jonetta Osgood, MD, 10 mL at 01/18/16 0425 .  sodium chloride flush (NS) 0.9 % injection 3 mL, 3 mL, Intravenous, Q12H, Jolaine Artist, MD, 3 mL at  01/18/16 1030 .  sodium chloride flush (NS) 0.9 % injection 3 mL, 3 mL, Intravenous, PRN, Jolaine Artist, MD .  technetium TC 88M diethylenetriame-pentaacetic acid (DTPA) injection 31.1 milli Curie, 31.1 milli Curie, Intravenous, Once PRN, Jonetta Osgood, MD .  vancomycin (VANCOCIN) IVPB 750 mg/150 ml premix, 750 mg, Intravenous, Q12H, Thuy D Dang, RPH, 750 mg at 01/19/16 1023    Vital Signs: BP 103/70 mmHg  Pulse 73  Temp(Src) 98.7 F (37.1 C) (Oral)  Resp 15  Ht 5' (1.524 m)  Wt 84 lb 7 oz (38.3 kg)  BMI 16.49 kg/m2  SpO2 94%  Physical Exam Lungs: CTA Chest tube intact, site clean. sm air leak 1 time with cough, could not reproduce thereafter.    Imaging: Nm Pulmonary Perf And Vent  01/15/2016  CLINICAL DATA:  Pulmonary hypertension. Possible chronic PE. Left-sided chest tube in place for empyema. EXAM: NUCLEAR MEDICINE VENTILATION - PERFUSION LUNG SCAN TECHNIQUE: Ventilation images were obtained in multiple projections using inhaled aerosol Tc-2m DTPA. Perfusion images were obtained in multiple projections after intravenous injection of Tc-58m MAA. RADIOPHARMACEUTICALS:  31.1 Technetium-94m DTPA aerosol inhalation and 4.1 Technetium-6m MAA IV COMPARISON:  Chest CT 12/14/2015 FINDINGS: Ventilation: Very limited ventilation examination. Central deposition of the radiopharmaceutical and limited visualization of lungs. Perfusion: Small left hemi thorax due to a large complex left pleural fluid collection. No definite segmental or subsegmental perfusion defects to suggest pulmonary embolism. IMPRESSION: Limited examination due to the limited ventilation study. Small left hemi thorax due to a left pleural fluid collection. No findings suspicious for pulmonary embolism. Electronically  Signed   By: Marijo Sanes M.D.   On: 01/15/2016 15:35   Dg Chest Port 1 View  01/18/2016  CLINICAL DATA:  Empyema with chest tube. EXAM: PORTABLE CHEST 1 VIEW COMPARISON:  01/17/2016 FINDINGS:  Right-sided PICC line unchanged with tip just below the cavoatrial junction. Left-sided chest tube unchanged. Lungs are adequately inflated. Stable to slight worsening opacification of the left base and lateral mid to lower left lung compatible with known pleural fluid collection/empyema. Vertical sliver of air over the lateral left base improved and thought to represent improving component of pleural air. Cardiomediastinal silhouette and remainder the exam is unchanged to include a small amount right pleural fluid as well as prominent bilateral pulmonary arteries. IMPRESSION: Stable to slightly worse left base opacification compatible with known pleural fluid collection/empyema. Improved sliver of pleural air in the lateral left base. Left-sided chest tube unchanged. Stable small right pleural effusion. Tubes and lines as described. Electronically Signed   By: Marin Olp M.D.   On: 01/18/2016 08:17   Dg Chest Port 1 View  01/17/2016  CLINICAL DATA:  Emphysema.  Chest tube. EXAM: PORTABLE CHEST 1 VIEW COMPARISON:  01/12/2016 FINDINGS: Stable left chest tube. Tiny pneumothorax at the left base improved. Pleural thickening and volume loss in the left hemi thorax is stable. Right lung is stable in appearance. There is opacity inferior to the right hilum and in the peripheral right lower lung zone. Small right pleural effusion is suspected. Right PICC placed. Tip is at the cavoatrial junction. IMPRESSION: Stable left chest tube.  Tiny left basilar pneumothorax is improved. Right upper extremity PICC place with its tip at the cavoatrial junction Otherwise stable study. Electronically Signed   By: Marybelle Killings M.D.   On: 01/17/2016 10:10    Labs:  CBC:  Recent Labs  01/12/16 0121 01/13/16 0256 01/18/16 0450 01/18/16 1030  WBC 5.7 4.7 6.4 6.8  HGB 11.4* 12.3* 11.3* 11.7*  HCT 37.3* 41.5 37.0* 38.7*  PLT 323 285 265 242    COAGS:  Recent Labs  09/26/15 1437 10/16/15 0854 01/11/16 2135  INR  1.16 1.18 1.12  APTT 32 34 32    BMP:  Recent Labs  01/13/16 0256 01/14/16 0610 01/15/16 0235 01/18/16 0450 01/18/16 1030  NA 135 136 135 137  --   K 5.2* 3.8 3.7 4.0  --   CL 94* 90* 89* 92*  --   CO2 36* 37* 38* 39*  --   GLUCOSE 120* 109* 98 96  --   BUN 7 8 6 6   --   CALCIUM 8.1* 7.9* 8.1* 8.2*  --   CREATININE 1.18 1.07 1.10 0.75 0.73  GFRNONAA >60 >60 >60 >60 >60  GFRAA >60 >60 >60 >60 >60    LIVER FUNCTION TESTS:  Recent Labs  01/12/16 0303 01/13/16 0256 01/14/16 0610 01/15/16 0235  BILITOT 0.4 0.1* 0.3 0.2*  AST 26 28 26 24   ALT 17 17 15* 14*  ALKPHOS 97 86 66 60  PROT 7.7 8.3* 6.9 7.2  ALBUMIN 2.6* 2.5* 2.3* 2.3*    Assessment and Plan: (L)chest empyema S/p pigtail chest tube Stable Following along with CTS  Electronically Signed: Ascencion Dike 01/19/2016, 2:48 PM   I spent a total of 15 Minutes at the the patient's bedside AND on the patient's hospital floor or unit, greater than 50% of which was counseling/coordinating care for left chest tube palcement

## 2016-01-19 NOTE — Progress Notes (Signed)
Patient requested interpreter be called to discuss his care and plans with Dr. Sloan Leiter. All questions answered.  Patient ambulated in hallway without oxygen. Room air saturations prior to walking were 93-94%.  Patient ambulated approximately 350 feet without oxygen and saturations ranged from 90-97% on room air. Patient did not have difficulty or shortness of breath. Patient returned to room and sitting in chair. Payton Emerald, RN

## 2016-01-19 NOTE — Progress Notes (Signed)
PATIENT DETAILS Name: Theoren Schramm Age: 55 y.o. Sex: male Date of Birth: [birth year] Admit Date: 01/11/2016 Admitting Physician Geradine Girt, DO PCP:No primary care provider on file.  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 Underwent chest tube placement by IR, total foot cultures positive for coagulase negative Staphylococcus. Infectious disease recommending IV vancomycin to 4/4-1 patient will be seen at the ID clinic. Cardiothoracic surgery following-currently still with chest tube in place. Separately, patient also has been experiencing exertional dyspnea that has been gradually worsening over the past few years, he claims that even walking 20 yards will now make him short of breath. Echocardiogram this admission shows a PA pressure of 81 mmHg-underwent RHC which showed PA = 59/24 (37). See below for further details.  Subjective: Continues to feel weak. But denies shortness of breath chest pain. Long discussion with patient translator at bedside  Assessment/Plan: Principal Problem: SIRS (systemic inflammatory response syndrome): SIRS pathophysiology resolved, Now afebrile. Secondary to recurrent left-sided empyema. Blood cultures negative, pleural fluid culture positive for coag neg staph-continue empiric vancomycin. ID consulted, recommendations are to continue vancomycin at least until 4/4-when patient has follow-up at the ID clinic, further decision to prolong or discontinue antibiotics will be made on that visit.PICC line placed on 3/14.  Active Problems: Recurrent left-sided empyema: Seen by cardiothoracic surgery, underwent CT-guided chest tube placement on 3/10. Pleural fluid cultures positive for coag neg staph-continue empiric vancomycin.CTVS/IR following-and managing chest tube. Defer  chest tube management to cardiothoracic surgery and IR.  Hyperkalemia: Resolved. Follow electrolytes periodically  Pulmonary hypertension: Echocardiogram confirms severe Pul HTN-PA pressure of 40mm Hg. RA,ANA, ANCA serology negative. Recent HIV test was negative as well. VQ scan low probability for PE, lower extremity Dopplers negative for DVT. CHF team consulted-underwent RHC on 3/16 which showed PA = 59/24 (37).Recommendations are to  assess need for home O2 and if he complies with home O2 can re-evaluate PA pressures in 3-6 months. If they remain elevated and he follows up can consider trial of selective pulmonary vasodilator like sildenafil.   Disposition: Remain inpatient-home once cleared by CTVS  Antimicrobial agents  See below  Anti-infectives    Start     Dose/Rate Route Frequency Ordered Stop   01/18/16 1100  vancomycin (VANCOCIN) IVPB 750 mg/150 ml premix     750 mg 150 mL/hr over 60 Minutes Intravenous Every 12 hours 01/18/16 1032     01/17/16 1000  vancomycin (VANCOCIN) IVPB 750 mg/150 ml premix  Status:  Discontinued     750 mg 150 mL/hr over 60 Minutes Intravenous Every 12 hours 01/17/16 0918 01/18/16 1032   01/14/16 0800  vancomycin (VANCOCIN) IVPB 1000 mg/200 mL premix  Status:  Discontinued     1,000 mg 200 mL/hr over 60 Minutes Intravenous Every 24 hours 01/14/16 0723 01/17/16 0918   01/13/16 1830  vancomycin (VANCOCIN) IVPB 750 mg/150 ml premix  Status:  Discontinued     750 mg 150 mL/hr over 60 Minutes Intravenous  Once 01/13/16 1820 01/13/16 1821   01/13/16 1830  vancomycin (VANCOCIN) IVPB 1000 mg/200 mL premix  Status:  Discontinued     1,000 mg 200 mL/hr over 60 Minutes Intravenous Every 24 hours 01/13/16 1822 01/14/16 0723   01/12/16 2330  vancomycin (VANCOCIN) IVPB 1000 mg/200 mL premix  Status:  Discontinued     1,000 mg 200 mL/hr over 60 Minutes Intravenous Every 24 hours 01/11/16 2256 01/12/16 1519   01/12/16 0600  piperacillin-tazobactam (ZOSYN) IVPB  3.375 g  Status:  Discontinued     3.375 g 12.5 mL/hr over 240 Minutes Intravenous 3 times per day 01/11/16 2256 01/15/16 1237   01/11/16 2300  vancomycin (VANCOCIN) IVPB 1000 mg/200 mL premix     1,000 mg 200 mL/hr over 60 Minutes Intravenous STAT 01/11/16 2254 01/12/16 0330   01/11/16 2100  vancomycin (VANCOCIN) IVPB 750 mg/150 ml premix  Status:  Discontinued     750 mg 150 mL/hr over 60 Minutes Intravenous  Once 01/11/16 2055 01/11/16 2254   01/11/16 2045  piperacillin-tazobactam (ZOSYN) IVPB 3.375 g     3.375 g 100 mL/hr over 30 Minutes Intravenous  Once 01/11/16 2044 01/12/16 0406   01/11/16 2045  vancomycin (VANCOCIN) IVPB 1000 mg/200 mL premix  Status:  Discontinued     1,000 mg 200 mL/hr over 60 Minutes Intravenous  Once 01/11/16 2044 01/11/16 2055      DVT Prophylaxis: Prophylactic Lovenox   Code Status: Full code   Family Communication None at bedside  Procedures: None  CONSULTS:  ID and Cardiothoracic surgery  Time spent 25 minutes-Greater than 50% of this time was spent in counseling, explanation of diagnosis, planning of further management, and coordination of care.  MEDICATIONS: Scheduled Meds: . enoxaparin (LOVENOX) injection  20 mg Subcutaneous Q24H  . pantoprazole  40 mg Oral Daily  . sodium chloride flush  3 mL Intravenous Q12H  . vancomycin  750 mg Intravenous Q12H   Continuous Infusions:  PRN Meds:.sodium chloride, sodium chloride, [DISCONTINUED] acetaminophen **OR** acetaminophen, acetaminophen, benzonatate, HYDROcodone-acetaminophen, ipratropium-albuterol, ondansetron (ZOFRAN) IV, ondansetron **OR** [DISCONTINUED] ondansetron (ZOFRAN) IV, sodium chloride flush, sodium chloride flush, technetium TC 70M diethylenetriame-pentaacetic acid    PHYSICAL EXAM: Vital signs in last 24 hours: Filed Vitals:   01/19/16 0245 01/19/16 0500 01/19/16 1113 01/19/16 1116  BP: 124/64 103/70    Pulse:  73    Temp:  98.7 F (37.1 C)    TempSrc:  Oral    Resp:  16 15    Height:      Weight:      SpO2: 93% 96% 93% 94%    Weight change:  Filed Weights   01/15/16 1926 01/17/16 1600  Weight: 41.731 kg (92 lb) 38.3 kg (84 lb 7 oz)   Body mass index is 16.49 kg/(m^2).   Gen Exam: Awake and alert with clear speech.  Neck: Supple, No JVD.   Chest: B/L Clear.  Chest tube in place. CVS: S1 S2 Regular, no murmurs.  Abdomen: soft, BS +, non tender, non distended.  Extremities: no edema, lower extremities warm to touch. Neurologic: Non Focal.   Skin: No Rash.   Wounds: N/A.    Intake/Output from previous day:  Intake/Output Summary (Last 24 hours) at 01/19/16 1353 Last data filed at 01/19/16 1239  Gross per 24 hour  Intake    360 ml  Output     40 ml  Net    320 ml     LAB RESULTS: CBC  Recent Labs Lab 01/13/16 0256 01/18/16 0450 01/18/16 1030  WBC 4.7 6.4 6.8  HGB 12.3* 11.3* 11.7*  HCT 41.5 37.0* 38.7*  PLT 285 265 242  MCV 87.4 85.5 85.8  MCH 25.9* 26.1 25.9*  MCHC 29.6* 30.5 30.2  RDW 15.0 14.3 14.4  LYMPHSABS 1.5  --   --  MONOABS 0.5  --   --   EOSABS 0.0  --   --   BASOSABS 0.0  --   --     Chemistries   Recent Labs Lab 01/13/16 0256 01/14/16 0610 01/15/16 0235 01/18/16 0450 01/18/16 1030  NA 135 136 135 137  --   K 5.2* 3.8 3.7 4.0  --   CL 94* 90* 89* 92*  --   CO2 36* 37* 38* 39*  --   GLUCOSE 120* 109* 98 96  --   BUN 7 8 6 6   --   CREATININE 1.18 1.07 1.10 0.75 0.73  CALCIUM 8.1* 7.9* 8.1* 8.2*  --     CBG: No results for input(s): GLUCAP in the last 168 hours.  GFR Estimated Creatinine Clearance: 57.8 mL/min (by C-G formula based on Cr of 0.73).  Coagulation profile No results for input(s): INR, PROTIME in the last 168 hours.  Cardiac Enzymes No results for input(s): CKMB, TROPONINI, MYOGLOBIN in the last 168 hours.  Invalid input(s): CK  Invalid input(s): POCBNP No results for input(s): DDIMER in the last 72 hours. No results for input(s): HGBA1C in the last 72 hours. No results  for input(s): CHOL, HDL, LDLCALC, TRIG, CHOLHDL, LDLDIRECT in the last 72 hours. No results for input(s): TSH, T4TOTAL, T3FREE, THYROIDAB in the last 72 hours.  Invalid input(s): FREET3 No results for input(s): VITAMINB12, FOLATE, FERRITIN, TIBC, IRON, RETICCTPCT in the last 72 hours. No results for input(s): LIPASE, AMYLASE in the last 72 hours.  Urine Studies No results for input(s): UHGB, CRYS in the last 72 hours.  Invalid input(s): UACOL, UAPR, USPG, UPH, UTP, UGL, UKET, UBIL, UNIT, UROB, ULEU, UEPI, UWBC, URBC, UBAC, CAST, UCOM, BILUA  MICROBIOLOGY: Recent Results (from the past 240 hour(s))  Culture, blood (x 2)     Status: None   Collection Time: 01/11/16  9:40 PM  Result Value Ref Range Status   Specimen Description BLOOD RIGHT ANTECUBITAL  Final   Special Requests BOTTLES DRAWN AEROBIC AND ANAEROBIC 10CC   Final   Culture NO GROWTH 5 DAYS  Final   Report Status 01/16/2016 FINAL  Final  Culture, blood (x 2)     Status: None   Collection Time: 01/11/16  9:40 PM  Result Value Ref Range Status   Specimen Description BLOOD LEFT ARM  Final   Special Requests BOTTLES DRAWN AEROBIC AND ANAEROBIC 5CC  Final   Culture NO GROWTH 5 DAYS  Final   Report Status 01/16/2016 FINAL  Final  Culture, body fluid-bottle     Status: None   Collection Time: 01/12/16  1:48 PM  Result Value Ref Range Status   Specimen Description FLUID LEFT PLEURAL  Final   Special Requests BOTTLES DRAWN AEROBIC AND ANAEROBIC 5CC  Final   Gram Stain   Final    GRAM POSITIVE COCCI IN CLUSTERS IN BOTH AEROBIC AND ANAEROBIC BOTTLES CRITICAL RESULT CALLED TO, READ BACK BY AND VERIFIED WITH: A BURTON 01/13/16 @ 1245 M VESTAL    Culture STAPHYLOCOCCUS SPECIES (COAGULASE NEGATIVE)  Final   Report Status 01/15/2016 FINAL  Final   Organism ID, Bacteria STAPHYLOCOCCUS SPECIES (COAGULASE NEGATIVE)  Final      Susceptibility   Staphylococcus species (coagulase negative) - MIC*    CIPROFLOXACIN <=0.5 SENSITIVE Sensitive      ERYTHROMYCIN >=8 RESISTANT Resistant     GENTAMICIN <=0.5 SENSITIVE Sensitive     OXACILLIN >=4 RESISTANT Resistant     TETRACYCLINE >=16 RESISTANT Resistant  VANCOMYCIN 1 SENSITIVE Sensitive     TRIMETH/SULFA <=10 SENSITIVE Sensitive     CLINDAMYCIN <=0.25 SENSITIVE Sensitive     RIFAMPIN <=0.5 SENSITIVE Sensitive     Inducible Clindamycin NEGATIVE Sensitive     * STAPHYLOCOCCUS SPECIES (COAGULASE NEGATIVE)  Gram stain     Status: None   Collection Time: 01/12/16  1:48 PM  Result Value Ref Range Status   Specimen Description FLUID LEFT PLEURAL  Final   Special Requests NONE  Final   Gram Stain   Final    ABUNDANT WBC PRESENT,BOTH PMN AND MONONUCLEAR NO ORGANISMS SEEN    Report Status 01/12/2016 FINAL  Final    RADIOLOGY STUDIES/RESULTS: Dg Chest 1 View  01/12/2016  CLINICAL DATA:  Patient status post catheter placement within the left chest. EXAM: CHEST 1 VIEW COMPARISON:  Chest radiograph 01/11/2016 FINDINGS: Stable cardiac and mediastinal contours with marked enlargement of the main pulmonary artery. Unchanged scattered bilateral heterogeneous pulmonary opacities, likely scarring. Interval placement of left pleural drainage catheter into a loculated left hydropneumothorax. Regional skeleton is unremarkable. IMPRESSION: Interval placement left pigtail drainage catheter into loculated left hydropneumothorax. Electronically Signed   By: Lovey Newcomer M.D.   On: 01/12/2016 14:23   Nm Pulmonary Perf And Vent  01/15/2016  CLINICAL DATA:  Pulmonary hypertension. Possible chronic PE. Left-sided chest tube in place for empyema. EXAM: NUCLEAR MEDICINE VENTILATION - PERFUSION LUNG SCAN TECHNIQUE: Ventilation images were obtained in multiple projections using inhaled aerosol Tc-65m DTPA. Perfusion images were obtained in multiple projections after intravenous injection of Tc-19m MAA. RADIOPHARMACEUTICALS:  31.1 Technetium-53m DTPA aerosol inhalation and 4.1 Technetium-71m MAA IV COMPARISON:   Chest CT 12/14/2015 FINDINGS: Ventilation: Very limited ventilation examination. Central deposition of the radiopharmaceutical and limited visualization of lungs. Perfusion: Small left hemi thorax due to a large complex left pleural fluid collection. No definite segmental or subsegmental perfusion defects to suggest pulmonary embolism. IMPRESSION: Limited examination due to the limited ventilation study. Small left hemi thorax due to a left pleural fluid collection. No findings suspicious for pulmonary embolism. Electronically Signed   By: Marijo Sanes M.D.   On: 01/15/2016 15:35   Dg Chest Port 1 View  01/18/2016  CLINICAL DATA:  Empyema with chest tube. EXAM: PORTABLE CHEST 1 VIEW COMPARISON:  01/17/2016 FINDINGS: Right-sided PICC line unchanged with tip just below the cavoatrial junction. Left-sided chest tube unchanged. Lungs are adequately inflated. Stable to slight worsening opacification of the left base and lateral mid to lower left lung compatible with known pleural fluid collection/empyema. Vertical sliver of air over the lateral left base improved and thought to represent improving component of pleural air. Cardiomediastinal silhouette and remainder the exam is unchanged to include a small amount right pleural fluid as well as prominent bilateral pulmonary arteries. IMPRESSION: Stable to slightly worse left base opacification compatible with known pleural fluid collection/empyema. Improved sliver of pleural air in the lateral left base. Left-sided chest tube unchanged. Stable small right pleural effusion. Tubes and lines as described. Electronically Signed   By: Marin Olp M.D.   On: 01/18/2016 08:17   Dg Chest Port 1 View  01/17/2016  CLINICAL DATA:  Emphysema.  Chest tube. EXAM: PORTABLE CHEST 1 VIEW COMPARISON:  01/12/2016 FINDINGS: Stable left chest tube. Tiny pneumothorax at the left base improved. Pleural thickening and volume loss in the left hemi thorax is stable. Right lung is stable in  appearance. There is opacity inferior to the right hilum and in the peripheral right lower lung zone. Small  right pleural effusion is suspected. Right PICC placed. Tip is at the cavoatrial junction. IMPRESSION: Stable left chest tube.  Tiny left basilar pneumothorax is improved. Right upper extremity PICC place with its tip at the cavoatrial junction Otherwise stable study. Electronically Signed   By: Marybelle Killings M.D.   On: 01/17/2016 10:10   Dg Chest Port 1 View  01/11/2016  CLINICAL DATA:  Sepsis EXAM: PORTABLE CHEST 1 VIEW COMPARISON:  CT chest 12/14/2015.  Chest x-ray 11/23/2015 FINDINGS: Left pleural loculated fluid and pleural scarring unchanged from the prior study. Prior CT demonstrated air-fluid level consistent with hydro pneumothorax. This is difficult to see on the chest x-ray but probably remains. 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 Underlying chronic lung disease with COPD Marked pulmonary artery enlargement with pulmonary artery hypertension again noted. IMPRESSION: Loculated left pleural effusion and pleural scarring unchanged with probable hydro pneumothorax on the left as seen on the prior CT of 12/14/2015. Severe pulmonary artery hypertension. No new findings compared with earlier studies. Electronically Signed   By: Franchot Gallo M.D.   On: 01/11/2016 21:18   Ct Perc Pleural Drain W/[place] Cath W/img Guide  01/12/2016  INDICATION: 55 year old male with concern for recurrent left-sided empyema. CT-guided drainage is requested. EXAM: CT PERC PLEURAL DRAIN W/[place]WE[place] CATH W/IMG GUIDE MEDICATIONS: The patient is currently admitted to the hospital and receiving intravenous antibiotics. The antibiotics were administered within an appropriate time frame prior to the initiation of the procedure. ANESTHESIA/SEDATION: Fentanyl  mcg IV; Versed  mg IV Moderate Sedation Time: The patient was continuously monitored during the procedure by the interventional radiology nurse under my direct  supervision. COMPLICATIONS: None immediate. Estimated blood loss: 0 PROCEDURE: Informed written consent was obtained from the patient after a thorough discussion of the procedural risks, benefits and alternatives. All questions were addressed. A timeout was performed prior to the initiation of the procedure. A planning axial CT scan was performed. A thick-walled loculated hydro pneumothorax is identified on the left. A suitable skin entry site was selected and marked. The region was sterilely prepped and draped in standard fashion using chlorhexidine skin prep. Local anesthesia was attained by infiltration with 1% lidocaine. A small dermatotomy was placed. Under intermittent CT guidance an 18 gauge trocar needle was advanced through the rib space and into the empyema. An Amplatz wire was then advanced through the trocar needle and directed toward the lung apex. The needle was removed. The skin tract was dilated to 32 [place] and a Cook 14 [place] all-purpose drainage catheter modified with multiple additional sideholes was advanced over the wire. The drain was connected to low wall suction via a pleura vac. Repeat CT imaging demonstrates excellent placement of the drain. All additional sideholes are within the pleural space and spanned the entirety of the empyema. Aspiration yields approximately 30 mL turbid bloody fluid. This was sent to the lab for culture. The tube was secured to the skin with 0 Prolene suture. An air tight bandage was applied. The patient tolerated the procedure well. IMPRESSION: Successful placement of a 1 French all-purpose drainage catheter modified with additional sideholes into the thick walled left-sided hydro pneumothorax. The aspirated material appears to be bloody. Signed, Criselda Peaches, MD [facility] Electronically Signed   By: Jacqulynn Cadet M.D.   On: 01/12/2016 14:32    Oren Binet, MD  [facility] Pager:[phone]  If 7PM-7AM, please contact night-coverage [email or web address] Password [email or web address] 01/19/2016, 1:53 PM   LOS: 8 days

## 2016-01-19 NOTE — Progress Notes (Signed)
ANTIBIOTIC CONSULT NOTE - INITIAL  Pharmacy Consult for Vancomycin Indication: Empyema  No Known Allergies  Patient Measurements: Height: 5' (152.4 cm) Weight: 84 lb 7 oz (38.3 kg) IBW/kg (Calculated) : 50  Vital Signs: Temp: 98.7 F (37.1 C) (03/17 0500) Temp Source: Oral (03/17 0500) BP: 103/70 mmHg (03/17 0500) Pulse Rate: 73 (03/17 0500) Intake/Output from previous day: 03/16 0701 - 03/17 0700 In: -  Out: 40 [Chest Tube:40] Intake/Output from this shift:    Labs:  Recent Labs  01/18/16 0450 01/18/16 1030  WBC 6.4 6.8  HGB 11.3* 11.7*  PLT 265 242  CREATININE 0.75 0.73   Estimated Creatinine Clearance: 57.8 mL/min (by C-G formula based on Cr of 0.73).  Recent Labs  01/17/16 0708 01/19/16 0854  [place] 7* 15     Microbiology:   Medical History: Past Medical History  Diagnosis Date  . Kidney stones   . Hypertension    Assessment:  Infectious Disease: Vanc D#6/21 for CoNS in pleural fluid, hx empyema s/p VATS in 09/2015 and 3wks outpatient Zosyn. Noncompliant with outpatient f/u.  ID doubts this is a true relapse of recent empyema but a new process by a super infected staph; need 3 weeks IV vanc - afebrile, WBC WNL, PICC placed 3/14  Vanc 3/10 x1, restarted 3/12 >>  - 3/15 VT = 7 mcg/ml on 1g q24 >> 750mg  q12 - 3/17 VT: 15 ok Zosyn 3/10 >> 3/13  3/9 BCx x2 - Negative 3/10 left pleural fluid - CoNS  Goal of Therapy:  Vancomycin trough level 15-20 mcg/ml  Plan:  - Vanc 750mg  IV Q12H - Vanco trough weekly   Arrington Yohe S. Alford Highland, PharmD, [facility] Clinical Staff Pharmacist Pager [phone]  Eilene Ghazi Stillinger 01/19/2016,10:17 AM

## 2016-01-19 NOTE — Progress Notes (Addendum)
      [address]       [place] [address]             [phone]      1 Day Post-Op Procedure(s) (LRB): Right Heart Cath (N/A)   Subjective:  Mr. Rizer has no new complaints.  Objective: Vital signs in last 24 hours: Temp:  [97.7 F (36.5 C)-98.9 F (37.2 C)] 98.7 F (37.1 C) (03/17 0500) Pulse Rate:  [62-92] 73 (03/17 0500) Cardiac Rhythm:  [-] Normal sinus rhythm (03/16 1954) Resp:  [13-29] 15 (03/17 0500) BP: (103-132)/(62-88) 103/70 mmHg (03/17 0500) SpO2:  [91 %-100 %] 96 % (03/17 0500)  Intake/Output from previous day: 03/16 0701 - 03/17 0700 In: -  Out: 40 [Chest Tube:40]  General appearance: alert, cooperative and no distress Heart: regular rate and rhythm Lungs: clear to auscultation bilaterally Wound: clean and dry  Lab Results:  Recent Labs  01/18/16 0450 01/18/16 1030  WBC 6.4 6.8  HGB 11.3* 11.7*  HCT 37.0* 38.7*  PLT 265 242   BMET:  Recent Labs  01/18/16 0450 01/18/16 1030  NA 137  --   K 4.0  --   CL 92*  --   CO2 39*  --   GLUCOSE 96  --   BUN 6  --   CREATININE 0.75 0.73  CALCIUM 8.2*  --     PT/INR: No results for input(s): LABPROT, INR in the last 72 hours. ABG    Component Value Date/Time   PHART 7.336* 01/18/2016 0934   HCO3 41.3* 01/18/2016 0939   TCO2 44 01/18/2016 0939   O2SAT 68.0 01/18/2016 0939   CBG (last 3)  No results for input(s): GLUCAP in the last 72 hours.  Assessment/Plan: S/P Procedure(s) (LRB): Right Heart Cath (N/A)  1. Chest tube- air leak resolved, 40 cc output yesterday- can possibly transition tube to water seal 2. ID- Empyema, + MR CNS, on ABX per ID 3. CV- PAH, catheterization done yesterday, Cardiology following 4. Dispo- patient stable, continue care per primary, will repeat CXR in AM   LOS: 8 days    BARRETT, ERIN 01/19/2016  Feels better, does not like to wear o2 , makes his nose bleed Chest tube to water seal, no air leak but does have tidling  I have seen and  examined [name] and agree with the above assessment  and plan.  [name] MD Beeper [phone] Office [phone] 01/19/2016 4:04 PM

## 2016-01-20 ENCOUNTER — Inpatient Hospital Stay (HOSPITAL_COMMUNITY): Payer: BLUE CROSS/BLUE SHIELD

## 2016-01-20 DIAGNOSIS — J869 Pyothorax without fistula: Secondary | ICD-10-CM

## 2016-01-20 NOTE — Progress Notes (Signed)
PATIENT DETAILS Name: [name] [name] Age: 55 y.o. Sex: male Date of Birth: [birth year] Admit Date: 01/11/2016 Admitting Physician Geradine Girt, DO PCP:No primary care provider on file.  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 Underwent chest tube placement by IR, total foot cultures positive for coagulase negative Staphylococcus. Infectious disease recommending IV vancomycin to 4/4-1 patient will be seen at the ID clinic. Cardiothoracic surgery following-currently still with chest tube in place. Separately, patient also has been experiencing exertional dyspnea that has been gradually worsening over the past few years, he claims that even walking 20 yards will now make him short of breath. Echocardiogram this admission shows a PA pressure of 81 mmHg-underwent RHC which showed PA = 59/24 (37). See below for further details.  Subjective: No chest pain or shortness of breath.  Assessment/Plan: Principal Problem: SIRS (systemic inflammatory response syndrome): SIRS pathophysiology resolved, Now afebrile. Secondary to recurrent left-sided empyema. Blood cultures negative, pleural fluid culture positive for coag neg staph-continue empiric vancomycin. ID consulted, recommendations are to continue vancomycin at least until 4/4-when patient has follow-up at the ID clinic, further decision to prolong or discontinue antibiotics will be made on that visit.PICC line placed on 3/14. Will need weekly CBC, CMP, Vanco trough levels faxed to the infectious disease clinic when discharged.  Active Problems: Recurrent left-sided empyema: Seen by cardiothoracic surgery, underwent CT-guided chest tube placement on 3/10. Pleural fluid cultures positive for coag neg staph-continue empiric vancomycin.CTVS/IR following-and managing  chest tube. Defer chest tube management to cardiothoracic surgery and IR.  Hyperkalemia: Resolved. Follow electrolytes periodically  Pulmonary hypertension: Echocardiogram confirms severe Pul HTN-PA pressure of 69mm Hg. RA,ANA, ANCA serology negative. Recent HIV test was negative as well. VQ scan low probability for PE, lower extremity Dopplers negative for DVT. CHF team consulted-underwent RHC on 3/16 which showed PA = 59/24 (37).Recommendations are to  assess need for home O2 prior to discharge.If he complies with home O2 cardiology plans to re-evaluate PA pressures in 3-6 months. If they remain elevated and he follows up with cardiology-then can consider trial of selective pulmonary vasodilator like sildenafil.   Disposition: Remain inpatient-home once cleared by [facility]  Antimicrobial agents  See below  Anti-infectives    Start     Dose/Rate Route Frequency Ordered Stop   01/18/16 1100  vancomycin (VANCOCIN) IVPB 750 mg/150 ml premix     750 mg 150 mL/hr over 60 Minutes Intravenous Every 12 hours 01/18/16 1032     01/17/16 1000  vancomycin (VANCOCIN) IVPB 750 mg/150 ml premix  Status:  Discontinued     750 mg 150 mL/hr over 60 Minutes Intravenous Every 12 hours 01/17/16 0918 01/18/16 1032   01/14/16 0800  vancomycin (VANCOCIN) IVPB 1000 mg/200 mL premix  Status:  Discontinued     1,000 mg 200 mL/hr over 60 Minutes Intravenous Every 24 hours 01/14/16 0723 01/17/16 0918   01/13/16 1830  vancomycin (VANCOCIN) IVPB 750 mg/150 ml premix  Status:  Discontinued     750 mg 150 mL/hr over 60 Minutes Intravenous  Once 01/13/16 1820 01/13/16 1821   01/13/16 1830  vancomycin (VANCOCIN) IVPB 1000 mg/200 mL premix  Status:  Discontinued     1,000 mg 200 mL/hr over 60 Minutes Intravenous Every 24 hours 01/13/16 1822 01/14/16 0723  01/12/16 2330  vancomycin (VANCOCIN) IVPB 1000 mg/200 mL premix  Status:  Discontinued     1,000 mg 200 mL/hr over 60 Minutes Intravenous Every 24 hours 01/11/16 2256  01/12/16 1519   01/12/16 0600  piperacillin-tazobactam (ZOSYN) IVPB 3.375 g  Status:  Discontinued     3.375 g 12.5 mL/hr over 240 Minutes Intravenous 3 times per day 01/11/16 2256 01/15/16 1237   01/11/16 2300  vancomycin (VANCOCIN) IVPB 1000 mg/200 mL premix     1,000 mg 200 mL/hr over 60 Minutes Intravenous STAT 01/11/16 2254 01/12/16 0330   01/11/16 2100  vancomycin (VANCOCIN) IVPB 750 mg/150 ml premix  Status:  Discontinued     750 mg 150 mL/hr over 60 Minutes Intravenous  Once 01/11/16 2055 01/11/16 2254   01/11/16 2045  piperacillin-tazobactam (ZOSYN) IVPB 3.375 g     3.375 g 100 mL/hr over 30 Minutes Intravenous  Once 01/11/16 2044 01/12/16 0406   01/11/16 2045  vancomycin (VANCOCIN) IVPB 1000 mg/200 mL premix  Status:  Discontinued     1,000 mg 200 mL/hr over 60 Minutes Intravenous  Once 01/11/16 2044 01/11/16 2055      DVT Prophylaxis: Prophylactic Lovenox   Code Status: Full code   Family Communication None at bedside  Procedures: None  CONSULTS:  ID and Cardiothoracic surgery  Time spent 25 minutes-Greater than 50% of this time was spent in counseling, explanation of diagnosis, planning of further management, and coordination of care.  MEDICATIONS: Scheduled Meds: . enoxaparin (LOVENOX) injection  20 mg Subcutaneous Q24H  . pantoprazole  40 mg Oral Daily  . sodium chloride flush  3 mL Intravenous Q12H  . vancomycin  750 mg Intravenous Q12H   Continuous Infusions:  PRN Meds:.sodium chloride, sodium chloride, [DISCONTINUED] acetaminophen **OR** acetaminophen, acetaminophen, benzonatate, HYDROcodone-acetaminophen, ipratropium-albuterol, ondansetron (ZOFRAN) IV, ondansetron **OR** [DISCONTINUED] ondansetron (ZOFRAN) IV, sodium chloride, sodium chloride flush, sodium chloride flush, technetium TC 6M diethylenetriame-pentaacetic acid    PHYSICAL EXAM: Vital signs in last 24 hours: Filed Vitals:   01/19/16 1116 01/19/16 1330 01/19/16 2026 01/20/16 0357  BP:   128/75 120/84 114/80  Pulse:  84 89 85  Temp:  98.7 F (37.1 C) 98.5 F (36.9 C) 98.2 F (36.8 C)  TempSrc:  Oral Oral Oral  Resp:  16 18 18   Height:      Weight:      SpO2: 94% 93% 92% 96%    Weight change:  Filed Weights   01/15/16 1926 01/17/16 1600  Weight: 41.731 kg (92 lb) 38.3 kg (84 lb 7 oz)   Body mass index is 16.49 kg/(m^2).   Gen Exam: Awake and alert with clear speech.  Neck: Supple, No JVD.   Chest: B/L Clear.  Chest tube in place. CVS: S1 S2 Regular, no murmurs.  Abdomen: soft, BS +, non tender, non distended.  Extremities: no edema, lower extremities warm to touch. Neurologic: Non Focal.   Skin: No Rash.   Wounds: N/A.    Intake/Output from previous day:  Intake/Output Summary (Last 24 hours) at 01/20/16 1230 Last data filed at 01/20/16 0900  Gross per 24 hour  Intake    720 ml  Output      0 ml  Net    720 ml     LAB RESULTS: CBC  Recent Labs Lab 01/18/16 0450 01/18/16 1030  WBC 6.4 6.8  HGB 11.3* 11.7*  HCT 37.0* 38.7*  PLT 265 242  MCV 85.5 85.8  MCH 26.1 25.9*  MCHC 30.5 30.2  RDW  14.3 14.4    Chemistries   Recent Labs Lab 01/14/16 0610 01/15/16 0235 01/18/16 0450 01/18/16 1030  NA 136 135 137  --   K 3.8 3.7 4.0  --   CL 90* 89* 92*  --   CO2 37* 38* 39*  --   GLUCOSE 109* 98 96  --   BUN 8 6 6   --   CREATININE 1.07 1.10 0.75 0.73  CALCIUM 7.9* 8.1* 8.2*  --     CBG: No results for input(s): GLUCAP in the last 168 hours.  GFR Estimated Creatinine Clearance: 57.8 mL/min (by C-G formula based on Cr of 0.73).  Coagulation profile No results for input(s): INR, PROTIME in the last 168 hours.  Cardiac Enzymes No results for input(s): CKMB, TROPONINI, MYOGLOBIN in the last 168 hours.  Invalid input(s): CK  Invalid input(s): POCBNP No results for input(s): DDIMER in the last 72 hours. No results for input(s): HGBA1C in the last 72 hours. No results for input(s): CHOL, HDL, LDLCALC, TRIG, CHOLHDL, LDLDIRECT in  the last 72 hours. No results for input(s): TSH, T4TOTAL, T3FREE, THYROIDAB in the last 72 hours.  Invalid input(s): FREET3 No results for input(s): VITAMINB12, FOLATE, FERRITIN, TIBC, IRON, RETICCTPCT in the last 72 hours. No results for input(s): LIPASE, AMYLASE in the last 72 hours.  Urine Studies No results for input(s): UHGB, CRYS in the last 72 hours.  Invalid input(s): UACOL, UAPR, USPG, UPH, UTP, UGL, UKET, UBIL, UNIT, UROB, ULEU, UEPI, UWBC, URBC, UBAC, CAST, UCOM, BILUA  MICROBIOLOGY: Recent Results (from the past 240 hour(s))  Culture, blood (x 2)     Status: None   Collection Time: 01/11/16  9:40 PM  Result Value Ref Range Status   Specimen Description BLOOD RIGHT ANTECUBITAL  Final   Special Requests BOTTLES DRAWN AEROBIC AND ANAEROBIC 10CC   Final   Culture NO GROWTH 5 DAYS  Final   Report Status 01/16/2016 FINAL  Final  Culture, blood (x 2)     Status: None   Collection Time: 01/11/16  9:40 PM  Result Value Ref Range Status   Specimen Description BLOOD LEFT ARM  Final   Special Requests BOTTLES DRAWN AEROBIC AND ANAEROBIC 5CC  Final   Culture NO GROWTH 5 DAYS  Final   Report Status 01/16/2016 FINAL  Final  Culture, body fluid-bottle     Status: None   Collection Time: 01/12/16  1:48 PM  Result Value Ref Range Status   Specimen Description FLUID LEFT PLEURAL  Final   Special Requests BOTTLES DRAWN AEROBIC AND ANAEROBIC 5CC  Final   Gram Stain   Final    GRAM POSITIVE COCCI IN CLUSTERS IN BOTH AEROBIC AND ANAEROBIC BOTTLES CRITICAL RESULT CALLED TO, READ BACK BY AND VERIFIED WITH: A BURTON 01/13/16 @ 1245 M VESTAL    Culture STAPHYLOCOCCUS SPECIES (COAGULASE NEGATIVE)  Final   Report Status 01/15/2016 FINAL  Final   Organism ID, Bacteria STAPHYLOCOCCUS SPECIES (COAGULASE NEGATIVE)  Final      Susceptibility   Staphylococcus species (coagulase negative) - MIC*    CIPROFLOXACIN <=0.5 SENSITIVE Sensitive     ERYTHROMYCIN >=8 RESISTANT Resistant     GENTAMICIN  <=0.5 SENSITIVE Sensitive     OXACILLIN >=4 RESISTANT Resistant     TETRACYCLINE >=16 RESISTANT Resistant     VANCOMYCIN 1 SENSITIVE Sensitive     TRIMETH/SULFA <=10 SENSITIVE Sensitive     CLINDAMYCIN <=0.25 SENSITIVE Sensitive     RIFAMPIN <=0.5 SENSITIVE Sensitive     Inducible  Clindamycin NEGATIVE Sensitive     * STAPHYLOCOCCUS SPECIES (COAGULASE NEGATIVE)  Gram stain     Status: None   Collection Time: 01/12/16  1:48 PM  Result Value Ref Range Status   Specimen Description FLUID LEFT PLEURAL  Final   Special Requests NONE  Final   Gram Stain   Final    ABUNDANT WBC PRESENT,BOTH PMN AND MONONUCLEAR NO ORGANISMS SEEN    Report Status 01/12/2016 FINAL  Final    RADIOLOGY STUDIES/RESULTS: Dg Chest 1 View  01/12/2016  CLINICAL DATA:  Patient status post catheter placement within the left chest. EXAM: CHEST 1 VIEW COMPARISON:  Chest radiograph 01/11/2016 FINDINGS: Stable cardiac and mediastinal contours with marked enlargement of the main pulmonary artery. Unchanged scattered bilateral heterogeneous pulmonary opacities, likely scarring. Interval placement of left pleural drainage catheter into a loculated left hydropneumothorax. Regional skeleton is unremarkable. IMPRESSION: Interval placement left pigtail drainage catheter into loculated left hydropneumothorax. Electronically Signed   By: Lovey Newcomer M.D.   On: 01/12/2016 14:23   Nm Pulmonary Perf And Vent  01/15/2016  CLINICAL DATA:  Pulmonary hypertension. Possible chronic PE. Left-sided chest tube in place for empyema. EXAM: NUCLEAR MEDICINE VENTILATION - PERFUSION LUNG SCAN TECHNIQUE: Ventilation images were obtained in multiple projections using inhaled aerosol Tc-46m DTPA. Perfusion images were obtained in multiple projections after intravenous injection of Tc-20m MAA. RADIOPHARMACEUTICALS:  31.1 Technetium-6m DTPA aerosol inhalation and 4.1 Technetium-68m MAA IV COMPARISON:  Chest CT 12/14/2015 FINDINGS: Ventilation: Very limited  ventilation examination. Central deposition of the radiopharmaceutical and limited visualization of lungs. Perfusion: Small left hemi thorax due to a large complex left pleural fluid collection. No definite segmental or subsegmental perfusion defects to suggest pulmonary embolism. IMPRESSION: Limited examination due to the limited ventilation study. Small left hemi thorax due to a left pleural fluid collection. No findings suspicious for pulmonary embolism. Electronically Signed   By: Marijo Sanes M.D.   On: 01/15/2016 15:35   Dg Chest Port 1 View  01/20/2016  CLINICAL DATA:  Empyema EXAM: PORTABLE CHEST 1 VIEW COMPARISON:  01/18/2016 chest radiograph. FINDINGS: Right PICC terminates at the cavoatrial junction. Left chest tube is in place. Stable cardiomediastinal silhouette with normal heart size. Stable small loculated basilar left hydropneumothorax. Stable minimal blunting of the right costophrenic angle. Stable patchy consolidation at the left lung base. Stable calcified pleural plaques at both lung bases. No pulmonary edema. IMPRESSION: 1. Stable small loculated left basilar hydropneumothorax. 2. Stable minimal blunting of the right costophrenic angle, either trace right pleural effusion and/or pleural-parenchymal scarring. 3. Stable calcified pleural plaques at both lung bases. 4. Stable patchy left lung base consolidation. Electronically Signed   By: Ilona Sorrel M.D.   On: 01/20/2016 10:39   Dg Chest Port 1 View  01/18/2016  CLINICAL DATA:  Empyema with chest tube. EXAM: PORTABLE CHEST 1 VIEW COMPARISON:  01/17/2016 FINDINGS: Right-sided PICC line unchanged with tip just below the cavoatrial junction. Left-sided chest tube unchanged. Lungs are adequately inflated. Stable to slight worsening opacification of the left base and lateral mid to lower left lung compatible with known pleural fluid collection/empyema. Vertical sliver of air over the lateral left base improved and thought to represent  improving component of pleural air. Cardiomediastinal silhouette and remainder the exam is unchanged to include a small amount right pleural fluid as well as prominent bilateral pulmonary arteries. IMPRESSION: Stable to slightly worse left base opacification compatible with known pleural fluid collection/empyema. Improved sliver of pleural air in the lateral left base.  Left-sided chest tube unchanged. Stable small right pleural effusion. Tubes and lines as described. Electronically Signed   By: Marin Olp M.D.   On: 01/18/2016 08:17   Dg Chest Port 1 View  01/17/2016  CLINICAL DATA:  Emphysema.  Chest tube. EXAM: PORTABLE CHEST 1 VIEW COMPARISON:  01/12/2016 FINDINGS: Stable left chest tube. Tiny pneumothorax at the left base improved. Pleural thickening and volume loss in the left hemi thorax is stable. Right lung is stable in appearance. There is opacity inferior to the right hilum and in the peripheral right lower lung zone. Small right pleural effusion is suspected. Right PICC placed. Tip is at the cavoatrial junction. IMPRESSION: Stable left chest tube.  Tiny left basilar pneumothorax is improved. Right upper extremity PICC place with its tip at the cavoatrial junction Otherwise stable study. Electronically Signed   By: Marybelle Killings M.D.   On: 01/17/2016 10:10   Dg Chest Port 1 View  01/11/2016  CLINICAL DATA:  Sepsis EXAM: PORTABLE CHEST 1 VIEW COMPARISON:  CT chest 12/14/2015.  Chest x-ray 11/23/2015 FINDINGS: Left pleural loculated fluid and pleural scarring unchanged from the prior study. Prior CT demonstrated air-fluid level consistent with hydro pneumothorax. This is difficult to see on the chest x-ray but probably remains. 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 Underlying chronic lung disease with COPD Marked pulmonary artery enlargement with pulmonary artery hypertension again noted. IMPRESSION: Loculated left pleural effusion and pleural scarring unchanged with probable hydro  pneumothorax on the left as seen on the prior CT of 12/14/2015. Severe pulmonary artery hypertension. No new findings compared with earlier studies. Electronically Signed   By: Franchot Gallo M.D.   On: 01/11/2016 21:18   Ct Perc Pleural Drain W/indwell Cath W/img Guide  01/12/2016  INDICATION: 55 year old male with concern for recurrent left-sided empyema. CT-guided drainage is requested. EXAM: CT PERC PLEURAL DRAIN W/[place]WELL CATH W/IMG GUIDE MEDICATIONS: The patient is currently admitted to the hospital and receiving intravenous antibiotics. The antibiotics were administered within an appropriate time frame prior to the initiation of the procedure. ANESTHESIA/SEDATION: Fentanyl  mcg IV; Versed  mg IV Moderate Sedation Time: The patient was continuously monitored during the procedure by the interventional radiology nurse under my direct supervision. COMPLICATIONS: None immediate. Estimated blood loss: 0 PROCEDURE: Informed written consent was obtained from the patient after a thorough discussion of the procedural risks, benefits and alternatives. All questions were addressed. A timeout was performed prior to the initiation of the procedure. A planning axial CT scan was performed. A thick-walled loculated hydro pneumothorax is identified on the left. A suitable skin entry site was selected and marked. The region was sterilely prepped and draped in standard fashion using chlorhexidine skin prep. Local anesthesia was attained by infiltration with 1% lidocaine. A small dermatotomy was placed. Under intermittent CT guidance an 18 gauge trocar needle was advanced through the rib space and into the empyema. An Amplatz wire was then advanced through the trocar needle and directed toward the lung apex. The needle was removed. The skin tract was dilated to 74 [place] and a Cook 14 [place] all-purpose drainage catheter modified with multiple additional sideholes was advanced over the wire. The drain was connected to low wall  suction via a pleura vac. Repeat CT imaging demonstrates excellent placement of the drain. All additional sideholes are within the pleural space and spanned the entirety of the empyema. Aspiration yields approximately 30 mL turbid bloody fluid. This was sent to the lab for culture. The tube was secured  to the skin with 0 Prolene suture. An air tight bandage was applied. The patient tolerated the procedure well. IMPRESSION: Successful placement of a 70 French all-purpose drainage catheter modified with additional sideholes into the thick walled left-sided hydro pneumothorax. The aspirated material appears to be bloody. Signed, Criselda Peaches, MD [facility] Electronically Signed   By: Jacqulynn Cadet M.D.   On: 01/12/2016 14:32    Oren Binet, MD  [facility] Pager:[phone]  If 7PM-7AM, please contact night-coverage [email or web address] Password [email or web address] 01/20/2016, 12:30 PM   LOS: 9 days

## 2016-01-20 NOTE — Progress Notes (Addendum)
      [address]       [place] [address]             [phone]      2 Days Post-Op Procedure(s) (LRB): Right Heart Cath (N/A)   Subjective:  Patient feels so/so... No new complaints  Objective: Vital signs in last 24 hours: Temp:  [98.2 F (36.8 C)-98.7 F (37.1 C)] 98.2 F (36.8 C) (03/18 0357) Pulse Rate:  [84-89] 85 (03/18 0357) Cardiac Rhythm:  [-] Normal sinus rhythm (03/18 0813) Resp:  [16-18] 18 (03/18 0357) BP: (114-128)/(75-84) 114/80 mmHg (03/18 0357) SpO2:  [92 %-96 %] 96 % (03/18 0357)  Intake/Output from previous day: 03/17 0701 - 03/18 0700 In: 600 [P.O.:600] Out: 0   General appearance: alert, cooperative and no distress Heart: regular rate and rhythm Lungs: diminished breath sounds on left Wound: clean and dry  Lab Results:  Recent Labs  01/18/16 0450 01/18/16 1030  WBC 6.4 6.8  HGB 11.3* 11.7*  HCT 37.0* 38.7*  PLT 265 242   BMET:  Recent Labs  01/18/16 0450 01/18/16 1030  NA 137  --   K 4.0  --   CL 92*  --   CO2 39*  --   GLUCOSE 96  --   BUN 6  --   CREATININE 0.75 0.73  CALCIUM 8.2*  --     PT/INR: No results for input(s): LABPROT, INR in the last 72 hours. ABG    Component Value Date/Time   PHART 7.336* 01/18/2016 0934   HCO3 41.3* 01/18/2016 0939   TCO2 44 01/18/2016 0939   O2SAT 68.0 01/18/2016 0939   CBG (last 3)  No results for input(s): GLUCAP in the last 72 hours.  Assessment/Plan: S/P Procedure(s) (LRB): Right Heart Cath (N/A)  1. Chest tube- on water seal, no air leak appreciated, no output yesterday- CXR remains stable in apperance, no pneumothorax  2. ID- empyema, ID following on ABX 3. Dispo- patient stable, possibly remove chest tube soon as there is little to no drainage present, will discuss with staff   LOS: 9 days    BARRETT, ERIN 01/20/2016  I have seen and examined the patient and agree with the assessment and plan as outlined.  Per Dr Servando Snare the tube is to remain in  place.  Rexene Alberts, MD 01/20/2016 8:03 PM

## 2016-01-21 MED ORDER — GUAIFENESIN ER 600 MG PO TB12
600.0000 mg | ORAL_TABLET | Freq: Two times a day (BID) | ORAL | Status: DC
  Administered 2016-01-21 – 2016-01-23 (×5): 600 mg via ORAL
  Filled 2016-01-21 (×5): qty 1

## 2016-01-21 NOTE — Progress Notes (Signed)
Nursing note Patient with assist x1 gait steady to chair, will monitor patient. Dejion Grillo, Bettina Gavia RN

## 2016-01-21 NOTE — Progress Notes (Addendum)
      [address]       [place] [address]             [phone]      3 Days Post-Op Procedure(s) (LRB): Right Heart Cath (N/A)   Subjective:  Patient complains of difficulty coughing up sputum this morning.  He states it is "stuck"  Objective: Vital signs in last 24 hours: Temp:  [98.1 F (36.7 C)-98.3 F (36.8 C)] 98.2 F (36.8 C) (03/19 0359) Pulse Rate:  [79-90] 79 (03/19 0359) Cardiac Rhythm:  [-] Normal sinus rhythm (03/19 0804) Resp:  [17-18] 18 (03/19 0359) BP: (109-117)/(68-79) 116/68 mmHg (03/19 0359) SpO2:  [94 %-98 %] 96 % (03/19 0359)  Intake/Output from previous day: 03/18 0701 - 03/19 0700 In: 1452 [P.O.:702; IV Piggyback:750] Out: 0  Intake/Output this shift: Total I/O In: -  Out: 10 [Chest Tube:10]  General appearance: alert, cooperative and no distress Heart: regular rate and rhythm Lungs: clear to auscultation bilaterally Abdomen: soft, non-tender; bowel sounds normal; no masses,  no organomegaly Wound: clean and dry  Lab Results:  Recent Labs  01/18/16 1030  WBC 6.8  HGB 11.7*  HCT 38.7*  PLT 242   BMET:  Recent Labs  01/18/16 1030  CREATININE 0.73    PT/INR: No results for input(s): LABPROT, INR in the last 72 hours. ABG    Component Value Date/Time   PHART 7.336* 01/18/2016 0934   HCO3 41.3* 01/18/2016 0939   TCO2 44 01/18/2016 0939   O2SAT 68.0 01/18/2016 0939   CBG (last 3)  No results for input(s): GLUCAP in the last 72 hours.  Assessment/Plan: S/P Procedure(s) (LRB): Right Heart Cath (N/A)  1. Chest tube- no air leak, 10 cc output recorded since midnight continue on water seal  2. Pulm- cough, unable to expectorate sputum, ordered Mucinex 3. CV- PAH, Cardiology following 4. ID- remains afebrile, on ABX for Empyema 5. Dispo- care per primary, will repeat CXR in AM   LOS: 10 days    BARRETT, ERIN 01/21/2016  I have seen and examined the patient and agree with the assessment and plan as  outlined.  Rexene Alberts, MD 01/21/2016 12:55 PM

## 2016-01-21 NOTE — Progress Notes (Addendum)
PATIENT DETAILS Name: [name] [name] Age: 55 y.o. Sex: male Date of Birth: 1961/01/09 Admit Date: 01/11/2016 Admitting Physician Geradine Girt, DO PCP:No primary care provider on file.  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 Underwent chest tube placement by IR, total foot cultures positive for coagulase negative Staphylococcus. Infectious disease recommending IV vancomycin to 4/4-1 patient will be seen at the ID clinic. Cardiothoracic surgery following-currently still with chest tube in place. Separately, patient also has been experiencing exertional dyspnea that has been gradually worsening over the past few years, he claims that even walking 20 yards will now make him short of breath. Echocardiogram this admission shows a PA pressure of 81 mmHg-underwent RHC which showed PA = 59/24 (37). See below for further details.  Subjective:  Patient in bed, minimal left-sided pleuritic chest pain at the site of chest tube, no shortness of breath or cough, no fever or chills, no abdominal pain or weakness.  Assessment/Plan:   Sepsis due to recurrent left-sided empyema. Sepsis pathophysiology resolved, Now afebrile. Blood cultures negative, pleural fluid culture positive for coag neg staph-continue empiric vancomycin. ID consulted, recommendations are to continue vancomycin at least until 4/4-when patient has follow-up at the ID clinic, further decision to prolong or discontinue antibiotics will be made on that visit.PICC line placed on 3/14. Will need weekly CBC, CMP, Vanco trough levels faxed to the infectious disease clinic when discharged.  He has also been seen by cardiothoracic surgery, underwent CT-guided chest tube placement on 3/10. Pleural fluid cultures positive for coag neg staph-continue empiric  vancomycin.CTVS/IR following-and managing chest tube. Chest tube now to water seal, likely to be removed on 01/22/2016.   Pulmonary hypertension: Echocardiogram confirms severe Pul HTN-PA pressure of 20mm Hg. RA,ANA, ANCA serology negative. Recent HIV test was negative as well. VQ scan low probability for PE, lower extremity Dopplers negative for DVT. CHF team consulted-underwent RHC on 3/16 which showed PA = 59/24 (37). Recommendations are to  assess need for home O2 prior to discharge.If he complies with home O2 cardiology plans to re-evaluate PA pressures in 3-6 months. If they remain elevated and he follows up with cardiology-then can consider trial of selective pulmonary vasodilator like sildenafil. Needs outpatient cardiology follow-up upon discharge.    Disposition: Remain inpatient-home once cleared by CTVS  Antimicrobial agents  See below  Anti-infectives    Start     Dose/Rate Route Frequency Ordered Stop   01/18/16 1100  vancomycin (VANCOCIN) IVPB 750 mg/150 ml premix     750 mg 150 mL/hr over 60 Minutes Intravenous Every 12 hours 01/18/16 1032     01/17/16 1000  vancomycin (VANCOCIN) IVPB 750 mg/150 ml premix  Status:  Discontinued     750 mg 150 mL/hr over 60 Minutes Intravenous Every 12 hours 01/17/16 0918 01/18/16 1032   01/14/16 0800  vancomycin (VANCOCIN) IVPB 1000 mg/200 mL premix  Status:  Discontinued     1,000 mg 200 mL/hr over 60 Minutes Intravenous Every 24 hours 01/14/16 0723 01/17/16 0918   01/13/16 1830  vancomycin (VANCOCIN) IVPB 750 mg/150 ml premix  Status:  Discontinued     750 mg 150 mL/hr over 60 Minutes Intravenous  Once 01/13/16 1820 01/13/16 1821   01/13/16 1830  vancomycin (VANCOCIN) IVPB 1000 mg/200 mL premix  Status:  Discontinued     1,000 mg 200 mL/hr over 60 Minutes Intravenous Every 24 hours 01/13/16 1822 01/14/16 0723   01/12/16 2330  vancomycin (VANCOCIN) IVPB 1000 mg/200 mL premix  Status:  Discontinued     1,000 mg 200 mL/hr over 60  Minutes Intravenous Every 24 hours 01/11/16 2256 01/12/16 1519   01/12/16 0600  piperacillin-tazobactam (ZOSYN) IVPB 3.375 g  Status:  Discontinued     3.375 g 12.5 mL/hr over 240 Minutes Intravenous 3 times per day 01/11/16 2256 01/15/16 1237   01/11/16 2300  vancomycin (VANCOCIN) IVPB 1000 mg/200 mL premix     1,000 mg 200 mL/hr over 60 Minutes Intravenous STAT 01/11/16 2254 01/12/16 0330   01/11/16 2100  vancomycin (VANCOCIN) IVPB 750 mg/150 ml premix  Status:  Discontinued     750 mg 150 mL/hr over 60 Minutes Intravenous  Once 01/11/16 2055 01/11/16 2254   01/11/16 2045  piperacillin-tazobactam (ZOSYN) IVPB 3.375 g     3.375 g 100 mL/hr over 30 Minutes Intravenous  Once 01/11/16 2044 01/12/16 0406   01/11/16 2045  vancomycin (VANCOCIN) IVPB 1000 mg/200 mL premix  Status:  Discontinued     1,000 mg 200 mL/hr over 60 Minutes Intravenous  Once 01/11/16 2044 01/11/16 2055      DVT Prophylaxis: Prophylactic Lovenox   Code Status: Full code   Family Communication None at bedside  Procedures: L. Chest tube placement  CONSULTS:  ID and Cardiothoracic surgery  Time spent 25 minutes-Greater than 50% of this time was spent in counseling, explanation of diagnosis, planning of further management, and coordination of care.  MEDICATIONS: Scheduled Meds: . enoxaparin (LOVENOX) injection  20 mg Subcutaneous Q24H  . guaiFENesin  600 mg Oral BID  . pantoprazole  40 mg Oral Daily  . sodium chloride flush  3 mL Intravenous Q12H  . vancomycin  750 mg Intravenous Q12H   Continuous Infusions:  PRN Meds:.sodium chloride, sodium chloride, [DISCONTINUED] acetaminophen **OR** acetaminophen, acetaminophen, benzonatate, HYDROcodone-acetaminophen, ipratropium-albuterol, ondansetron (ZOFRAN) IV, ondansetron **OR** [DISCONTINUED] ondansetron (ZOFRAN) IV, sodium chloride, sodium chloride flush, sodium chloride flush, technetium TC 70M diethylenetriame-pentaacetic acid    PHYSICAL EXAM: Vital  signs in last 24 hours: Filed Vitals:   01/20/16 1400 01/20/16 1752 01/20/16 1930 01/21/16 0359  BP: 117/79 117/77 109/73 116/68  Pulse: 90 81 81 79  Temp: 98.3 F (36.8 C) 98.1 F (36.7 C) 98.2 F (36.8 C) 98.2 F (36.8 C)  TempSrc: Oral Oral Oral Oral  Resp: 18 17 18 18   Height:      Weight:      SpO2: 94% 98% 97% 96%    Weight change:  Filed Weights   01/15/16 1926 01/17/16 1600  Weight: 41.731 kg (92 lb) 38.3 kg (84 lb 7 oz)   Body mass index is 16.49 kg/(m^2).   Gen Exam: Awake and alert with clear speech.  Neck: Supple, No JVD.   Chest: B/L Clear.  L. Chest tube in place. CVS: S1 S2 Regular, no murmurs.  Abdomen: soft, BS +, non tender, non distended.  Extremities: no edema, lower extremities warm to touch. Neurologic: Non Focal.   Skin: No Rash.   Wounds: N/A.    Intake/Output from previous day:  Intake/Output Summary (Last 24 hours) at 01/21/16 1335 Last data filed at 01/21/16 0804  Gross per 24 hour  Intake    462 ml  Output     10 ml  Net    452 ml     LAB RESULTS: CBC  Recent Labs Lab 01/18/16 0450 01/18/16 1030  WBC 6.4 6.8  HGB 11.3* 11.7*  HCT 37.0* 38.7*  PLT 265 242  MCV 85.5 85.8  MCH 26.1 25.9*  MCHC 30.5 30.2  RDW 14.3 14.4    Chemistries   Recent Labs Lab 01/15/16 0235 01/18/16 0450 01/18/16 1030  NA 135 137  --   K 3.7 4.0  --   CL 89* 92*  --   CO2 38* 39*  --   GLUCOSE 98 96  --   BUN 6 6  --   CREATININE 1.10 0.75 0.73  CALCIUM 8.1* 8.2*  --     CBG: No results for input(s): GLUCAP in the last 168 hours.  GFR Estimated Creatinine Clearance: 57.8 mL/min (by C-G formula based on Cr of 0.73).  Coagulation profile No results for input(s): INR, PROTIME in the last 168 hours.  Cardiac Enzymes No results for input(s): CKMB, TROPONINI, MYOGLOBIN in the last 168 hours.  Invalid input(s): CK  Invalid input(s): POCBNP No results for input(s): DDIMER in the last 72 hours. No results for input(s): HGBA1C in the  last 72 hours. No results for input(s): CHOL, HDL, LDLCALC, TRIG, CHOLHDL, LDLDIRECT in the last 72 hours. No results for input(s): TSH, T4TOTAL, T3FREE, THYROIDAB in the last 72 hours.  Invalid input(s): FREET3 No results for input(s): VITAMINB12, FOLATE, FERRITIN, TIBC, IRON, RETICCTPCT in the last 72 hours. No results for input(s): LIPASE, AMYLASE in the last 72 hours.  Urine Studies No results for input(s): UHGB, CRYS in the last 72 hours.  Invalid input(s): UACOL, UAPR, USPG, UPH, UTP, UGL, UKET, UBIL, UNIT, UROB, ULEU, UEPI, UWBC, URBC, UBAC, CAST, UCOM, BILUA  MICROBIOLOGY: Recent Results (from the past 240 hour(s))  Culture, blood (x 2)     Status: None   Collection Time: 01/11/16  9:40 PM  Result Value Ref Range Status   Specimen Description BLOOD RIGHT ANTECUBITAL  Final   Special Requests BOTTLES DRAWN AEROBIC AND ANAEROBIC 10CC   Final   Culture NO GROWTH 5 DAYS  Final   Report Status 01/16/2016 FINAL  Final  Culture, blood (x 2)     Status: None   Collection Time: 01/11/16  9:40 PM  Result Value Ref Range Status   Specimen Description BLOOD LEFT ARM  Final   Special Requests BOTTLES DRAWN AEROBIC AND ANAEROBIC 5CC  Final   Culture NO GROWTH 5 DAYS  Final   Report Status 01/16/2016 FINAL  Final  Culture, body fluid-bottle     Status: None   Collection Time: 01/12/16  1:48 PM  Result Value Ref Range Status   Specimen Description FLUID LEFT PLEURAL  Final   Special Requests BOTTLES DRAWN AEROBIC AND ANAEROBIC 5CC  Final   Gram Stain   Final    GRAM POSITIVE COCCI IN CLUSTERS IN BOTH AEROBIC AND ANAEROBIC BOTTLES CRITICAL RESULT CALLED TO, READ BACK BY AND VERIFIED WITH: A BURTON 01/13/16 @ 1245 M VESTAL    Culture STAPHYLOCOCCUS SPECIES (COAGULASE NEGATIVE)  Final   Report Status 01/15/2016 FINAL  Final   Organism ID, Bacteria STAPHYLOCOCCUS SPECIES (COAGULASE NEGATIVE)  Final      Susceptibility   Staphylococcus species (coagulase negative) - MIC*     CIPROFLOXACIN <=0.5 SENSITIVE Sensitive     ERYTHROMYCIN >=8 RESISTANT Resistant     GENTAMICIN <=0.5 SENSITIVE Sensitive     OXACILLIN >=4 RESISTANT Resistant     TETRACYCLINE >=16 RESISTANT Resistant     VANCOMYCIN 1 SENSITIVE Sensitive  TRIMETH/SULFA <=10 SENSITIVE Sensitive     CLINDAMYCIN <=0.25 SENSITIVE Sensitive     RIFAMPIN <=0.5 SENSITIVE Sensitive     Inducible Clindamycin NEGATIVE Sensitive     * STAPHYLOCOCCUS SPECIES (COAGULASE NEGATIVE)  Gram stain     Status: None   Collection Time: 01/12/16  1:48 PM  Result Value Ref Range Status   Specimen Description FLUID LEFT PLEURAL  Final   Special Requests NONE  Final   Gram Stain   Final    ABUNDANT WBC PRESENT,BOTH PMN AND MONONUCLEAR NO ORGANISMS SEEN    Report Status 01/12/2016 FINAL  Final    RADIOLOGY STUDIES/RESULTS: Dg Chest 1 View  01/12/2016  CLINICAL DATA:  Patient status post catheter placement within the left chest. EXAM: CHEST 1 VIEW COMPARISON:  Chest radiograph 01/11/2016 FINDINGS: Stable cardiac and mediastinal contours with marked enlargement of the main pulmonary artery. Unchanged scattered bilateral heterogeneous pulmonary opacities, likely scarring. Interval placement of left pleural drainage catheter into a loculated left hydropneumothorax. Regional skeleton is unremarkable. IMPRESSION: Interval placement left pigtail drainage catheter into loculated left hydropneumothorax. Electronically Signed   By: Lovey Newcomer M.D.   On: 01/12/2016 14:23   Nm Pulmonary Perf And Vent  01/15/2016  CLINICAL DATA:  Pulmonary hypertension. Possible chronic PE. Left-sided chest tube in place for empyema. EXAM: NUCLEAR MEDICINE VENTILATION - PERFUSION LUNG SCAN TECHNIQUE: Ventilation images were obtained in multiple projections using inhaled aerosol Tc-17m DTPA. Perfusion images were obtained in multiple projections after intravenous injection of Tc-75m MAA. RADIOPHARMACEUTICALS:  31.1 Technetium-65m DTPA aerosol inhalation and  4.1 Technetium-29m MAA IV COMPARISON:  Chest CT 12/14/2015 FINDINGS: Ventilation: Very limited ventilation examination. Central deposition of the radiopharmaceutical and limited visualization of lungs. Perfusion: Small left hemi thorax due to a large complex left pleural fluid collection. No definite segmental or subsegmental perfusion defects to suggest pulmonary embolism. IMPRESSION: Limited examination due to the limited ventilation study. Small left hemi thorax due to a left pleural fluid collection. No findings suspicious for pulmonary embolism. Electronically Signed   By: Marijo Sanes M.D.   On: 01/15/2016 15:35   Dg Chest Port 1 View  01/20/2016  CLINICAL DATA:  Empyema EXAM: PORTABLE CHEST 1 VIEW COMPARISON:  01/18/2016 chest radiograph. FINDINGS: Right PICC terminates at the cavoatrial junction. Left chest tube is in place. Stable cardiomediastinal silhouette with normal heart size. Stable small loculated basilar left hydropneumothorax. Stable minimal blunting of the right costophrenic angle. Stable patchy consolidation at the left lung base. Stable calcified pleural plaques at both lung bases. No pulmonary edema. IMPRESSION: 1. Stable small loculated left basilar hydropneumothorax. 2. Stable minimal blunting of the right costophrenic angle, either trace right pleural effusion and/or pleural-parenchymal scarring. 3. Stable calcified pleural plaques at both lung bases. 4. Stable patchy left lung base consolidation. Electronically Signed   By: Ilona Sorrel M.D.   On: 01/20/2016 10:39   Dg Chest Port 1 View  01/18/2016  CLINICAL DATA:  Empyema with chest tube. EXAM: PORTABLE CHEST 1 VIEW COMPARISON:  01/17/2016 FINDINGS: Right-sided PICC line unchanged with tip just below the cavoatrial junction. Left-sided chest tube unchanged. Lungs are adequately inflated. Stable to slight worsening opacification of the left base and lateral mid to lower left lung compatible with known pleural fluid  collection/empyema. Vertical sliver of air over the lateral left base improved and thought to represent improving component of pleural air. Cardiomediastinal silhouette and remainder the exam is unchanged to include a small amount right pleural fluid as well as prominent bilateral pulmonary  arteries. IMPRESSION: Stable to slightly worse left base opacification compatible with known pleural fluid collection/empyema. Improved sliver of pleural air in the lateral left base. Left-sided chest tube unchanged. Stable small right pleural effusion. Tubes and lines as described. Electronically Signed   By: Marin Olp M.D.   On: 01/18/2016 08:17   Dg Chest Port 1 View  01/17/2016  CLINICAL DATA:  Emphysema.  Chest tube. EXAM: PORTABLE CHEST 1 VIEW COMPARISON:  01/12/2016 FINDINGS: Stable left chest tube. Tiny pneumothorax at the left base improved. Pleural thickening and volume loss in the left hemi thorax is stable. Right lung is stable in appearance. There is opacity inferior to the right hilum and in the peripheral right lower lung zone. Small right pleural effusion is suspected. Right PICC placed. Tip is at the cavoatrial junction. IMPRESSION: Stable left chest tube.  Tiny left basilar pneumothorax is improved. Right upper extremity PICC place with its tip at the cavoatrial junction Otherwise stable study. Electronically Signed   By: Marybelle Killings M.D.   On: 01/17/2016 10:10   Dg Chest Port 1 View  01/11/2016  CLINICAL DATA:  Sepsis EXAM: PORTABLE CHEST 1 VIEW COMPARISON:  CT chest 12/14/2015.  Chest x-ray 11/23/2015 FINDINGS: Left pleural loculated fluid and pleural scarring unchanged from the prior study. Prior CT demonstrated air-fluid level consistent with hydro pneumothorax. This is difficult to see on the chest x-ray but probably remains. 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 Underlying chronic lung disease with COPD Marked pulmonary artery enlargement with pulmonary artery hypertension again  noted. IMPRESSION: Loculated left pleural effusion and pleural scarring unchanged with probable hydro pneumothorax on the left as seen on the prior CT of 12/14/2015. Severe pulmonary artery hypertension. No new findings compared with earlier studies. Electronically Signed   By: Franchot Gallo M.D.   On: 01/11/2016 21:18   Ct Perc Pleural Drain W/[place]well Cath W/img Guide  01/12/2016  INDICATION: 55 year old male with concern for recurrent left-sided empyema. CT-guided drainage is requested. EXAM: CT PERC PLEURAL DRAIN W/[place]WELL CATH W/IMG GUIDE MEDICATIONS: The patient is currently admitted to the hospital and receiving intravenous antibiotics. The antibiotics were administered within an appropriate time frame prior to the initiation of the procedure. ANESTHESIA/SEDATION: Fentanyl  mcg IV; Versed  mg IV Moderate Sedation Time: The patient was continuously monitored during the procedure by the interventional radiology nurse under my direct supervision. COMPLICATIONS: None immediate. Estimated blood loss: 0 PROCEDURE: Informed written consent was obtained from the patient after a thorough discussion of the procedural risks, benefits and alternatives. All questions were addressed. A timeout was performed prior to the initiation of the procedure. A planning axial CT scan was performed. A thick-walled loculated hydro pneumothorax is identified on the left. A suitable skin entry site was selected and marked. The region was sterilely prepped and draped in standard fashion using chlorhexidine skin prep. Local anesthesia was attained by infiltration with 1% lidocaine. A small dermatotomy was placed. Under intermittent CT guidance an 18 gauge trocar needle was advanced through the rib space and into the empyema. An Amplatz wire was then advanced through the trocar needle and directed toward the lung apex. The needle was removed. The skin tract was dilated to 48 [place] and a Cook 14 [place] all-purpose drainage catheter  modified with multiple additional sideholes was advanced over the wire. The drain was connected to low wall suction via a pleura vac. Repeat CT imaging demonstrates excellent placement of the drain. All additional sideholes are within the pleural space and spanned  the entirety of the empyema. Aspiration yields approximately 30 mL turbid bloody fluid. This was sent to the lab for culture. The tube was secured to the skin with 0 Prolene suture. An air tight bandage was applied. The patient tolerated the procedure well. IMPRESSION: Successful placement of a 110 French all-purpose drainage catheter modified with additional sideholes into the thick walled left-sided hydro pneumothorax. The aspirated material appears to be bloody. Signed, Criselda Peaches, MD [facility] Electronically Signed   By: Jacqulynn Cadet M.D.   On: 01/12/2016 14:32    Thurnell Lose, MD  [facility] Pager:[phone]  If 7PM-7AM, please contact night-coverage [email or web address] Password [email or web address] 01/21/2016, 1:35 PM   LOS: 10 days

## 2016-01-21 NOTE — Progress Notes (Signed)
Patient ID: [name] [name], male   DOB: 12/07/1960, 55 y.o.   MRN: [id number]    Referring Physician(s): Dr. Ceasar Mons  Supervising Physician: Markus Daft  Chief Complaint: (L) chest empyemia  Subjective: Pt with no new c/o today  Allergies: Review of patient's allergies indicates no known allergies.  Medications: Prior to Admission medications   Not on File    Vital Signs: BP 116/68 mmHg  Pulse 79  Temp(Src) 98.2 F (36.8 C) (Oral)  Resp 18  Ht 5' (1.524 m)  Wt 84 lb 7 oz (38.3 kg)  BMI 16.49 kg/m2  SpO2 96%  Physical Exam: Chest: CTAB, chest tube in place and drain site is c/d/i.  10cc of serosang output in 24 hrs.  No air leak on water seal  Imaging: Dg Chest Port 1 View  01/20/2016  CLINICAL DATA:  Empyema EXAM: PORTABLE CHEST 1 VIEW COMPARISON:  01/18/2016 chest radiograph. FINDINGS: Right PICC terminates at the cavoatrial junction. Left chest tube is in place. Stable cardiomediastinal silhouette with normal heart size. Stable small loculated basilar left hydropneumothorax. Stable minimal blunting of the right costophrenic angle. Stable patchy consolidation at the left lung base. Stable calcified pleural plaques at both lung bases. No pulmonary edema. IMPRESSION: 1. Stable small loculated left basilar hydropneumothorax. 2. Stable minimal blunting of the right costophrenic angle, either trace right pleural effusion and/or pleural-parenchymal scarring. 3. Stable calcified pleural plaques at both lung bases. 4. Stable patchy left lung base consolidation. Electronically Signed   By: Ilona Sorrel M.D.   On: 01/20/2016 10:39   Dg Chest Port 1 View  01/18/2016  CLINICAL DATA:  Empyema with chest tube. EXAM: PORTABLE CHEST 1 VIEW COMPARISON:  01/17/2016 FINDINGS: Right-sided PICC line unchanged with tip just below the cavoatrial junction. Left-sided chest tube unchanged. Lungs are adequately inflated. Stable to slight worsening opacification of the left base and lateral mid to lower  left lung compatible with known pleural fluid collection/empyema. Vertical sliver of air over the lateral left base improved and thought to represent improving component of pleural air. Cardiomediastinal silhouette and remainder the exam is unchanged to include a small amount right pleural fluid as well as prominent bilateral pulmonary arteries. IMPRESSION: Stable to slightly worse left base opacification compatible with known pleural fluid collection/empyema. Improved sliver of pleural air in the lateral left base. Left-sided chest tube unchanged. Stable small right pleural effusion. Tubes and lines as described. Electronically Signed   By: Marin Olp M.D.   On: 01/18/2016 08:17    Labs:  CBC:  Recent Labs  01/12/16 0121 01/13/16 0256 01/18/16 0450 01/18/16 1030  WBC 5.7 4.7 6.4 6.8  HGB 11.4* 12.3* 11.3* 11.7*  HCT 37.3* 41.5 37.0* 38.7*  PLT 323 285 265 242    COAGS:  Recent Labs  09/26/15 1437 10/16/15 0854 01/11/16 2135  INR 1.16 1.18 1.12  APTT 32 34 32    BMP:  Recent Labs  01/13/16 0256 01/14/16 0610 01/15/16 0235 01/18/16 0450 01/18/16 1030  NA 135 136 135 137  --   K 5.2* 3.8 3.7 4.0  --   CL 94* 90* 89* 92*  --   CO2 36* 37* 38* 39*  --   GLUCOSE 120* 109* 98 96  --   BUN 7 8 6 6   --   CALCIUM 8.1* 7.9* 8.1* 8.2*  --   CREATININE 1.18 1.07 1.10 0.75 0.73  GFRNONAA >60 >60 >60 >60 >60  GFRAA >60 >60 >60 >60 >60    LIVER  FUNCTION TESTS:  Recent Labs  01/12/16 0303 01/13/16 0256 01/14/16 0610 01/15/16 0235  BILITOT 0.4 0.1* 0.3 0.2*  AST 26 28 26 24   ALT 17 17 15* 14*  ALKPHOS 97 86 66 60  PROT 7.7 8.3* 6.9 7.2  ALBUMIN 2.6* 2.5* 2.3* 2.3*    Assessment and Plan: (L) chest empyema, s/p pigtail chest tube placement -stable, no air leak -cont on water seal and repeat CXR in am, per TCTS   Electronically Signed: Brittin Belnap E 01/21/2016, 10:08 AM   I spent a total of 15 Minutes at the the patient's bedside AND on the patient's  hospital floor or unit, greater than 50% of which was counseling/coordinating care for (L) chest empyema s/p drain placement

## 2016-01-21 NOTE — Progress Notes (Signed)
ANTIBIOTIC CONSULT NOTE - INITIAL  Pharmacy Consult for Vancomycin Indication: Empyema  No Known Allergies  Patient Measurements: Height: 5' (152.4 cm) Weight: 84 lb 7 oz (38.3 kg) IBW/kg (Calculated) : 50  Vital Signs: Temp: 98.2 F (36.8 C) (03/19 0359) Temp Source: Oral (03/19 0359) BP: 116/68 mmHg (03/19 0359) Pulse Rate: 79 (03/19 0359) Intake/Output from previous day: 03/18 0701 - 03/19 0700 In: 1452 [P.O.:702; IV Piggyback:750] Out: 0  Intake/Output from this shift: Total I/O In: -  Out: 10 [Chest Tube:10]  Labs: No results for input(s): WBC, HGB, PLT, LABCREA, CREATININE in the last 72 hours. Estimated Creatinine Clearance: 57.8 mL/min (by C-G formula based on Cr of 0.73).  Recent Labs  01/19/16 0854  [facility] 15     Microbiology:   Medical History: Past Medical History  Diagnosis Date  . Kidney stones   . Hypertension    Assessment:  Infectious Disease: Vanc D#8/21 for CoNS in pleural fluid, hx empyema s/p VATS in 09/2015 and 3wks outpatient Zosyn. Noncompliant with outpatient f/u.  ID doubts this is a true relapse of recent empyema but a new process by a super infected staph; need 3 weeks IV vanc - afebrile, WBC WNL, PICC placed 3/14  Vanc 3/10 x1, restarted 3/12 >> (4/4) - 3/15 VT = 7 mcg/ml on 1g q24 >> 750mg  q12 - 3/17 VT: 15 ok Zosyn 3/10 >> 3/13  3/9 BCx x2 - Negative 3/10 left pleural fluid - CoNS   Goal of Therapy:  Vancomycin trough level 15-20 mcg/ml  Plan:  - Vanc 750mg  IV Q12H--trough weekly - BMET and CBC 3/20   Joda Braatz S. Alford Highland, PharmD, BCPS Clinical Staff Pharmacist Pager [phone]  Eilene Ghazi Stillinger 01/21/2016,1:42 PM

## 2016-01-21 NOTE — Progress Notes (Signed)
Patient ambulated 500 feet x1 assist in hallway, patient tolerated well with out complaints. Patient with some coughing while walking but otherwise no complaints. Back in room will monitor patient. [name] [name], Bettina Gavia RN

## 2016-01-22 ENCOUNTER — Inpatient Hospital Stay (HOSPITAL_COMMUNITY): Payer: BLUE CROSS/BLUE SHIELD

## 2016-01-22 LAB — BASIC METABOLIC PANEL
ANION GAP: 9 (ref 5–15)
BUN: 11 mg/dL (ref 6–20)
CALCIUM: 8.4 mg/dL — AB (ref 8.9–10.3)
CO2: 29 mmol/L (ref 22–32)
CREATININE: 0.94 mg/dL (ref 0.61–1.24)
Chloride: 101 mmol/L (ref 101–111)
GFR calc Af Amer: >60 mL/min (ref >60)
GLUCOSE: 90 mg/dL (ref 65–99)
Potassium: 3.8 mmol/L (ref 3.5–5.1)
Sodium: 139 mmol/L (ref 135–145)

## 2016-01-22 LAB — CBC
HEMATOCRIT: 35 % — AB (ref 39.0–52.0)
Hemoglobin: 10.6 g/dL — ABNORMAL LOW (ref 13.0–17.0)
MCH: 25.3 pg — ABNORMAL LOW (ref 26.0–34.0)
MCHC: 30.3 g/dL (ref 30.0–36.0)
MCV: 83.5 fL (ref 78.0–100.0)
PLATELETS: 376 10*3/uL (ref 150–400)
RBC: 4.19 MIL/uL — AB (ref 4.22–5.81)
RDW: 14.6 % (ref 11.5–15.5)
WBC: 7.6 10*3/uL (ref 4.0–10.5)

## 2016-01-22 NOTE — Progress Notes (Signed)
Nursing note IR PAC Pam Turpin made aware of patient order to DC chest tube, stated Ok to pull, Chest tube removed as ordered, Chest tube curled upon removal at entry site, sutures removed as no suture to assist with closure of skin, pressure dressing applied and patient tolerated well. Will continue to closely monitor patient. Miley Blanchett, Bettina Gavia RN

## 2016-01-22 NOTE — Progress Notes (Signed)
PATIENT DETAILS Name: [name] [name] Age: 55 y.o. Sex: male Date of Birth: 12-Apr-1961 Admit Date: 01/11/2016 Admitting Physician Geradine Girt, DO PCP:No primary care provider on file.  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 Underwent chest tube placement by IR, total foot cultures positive for coagulase negative Staphylococcus. Infectious disease recommending IV vancomycin to 4/4-1 patient will be seen at the ID clinic. Cardiothoracic surgery following-currently still with chest tube in place. Separately, patient also has been experiencing exertional dyspnea that has been gradually worsening over the past few years, he claims that even walking 20 yards will now make him short of breath. Echocardiogram this admission shows a PA pressure of 81 mmHg-underwent RHC which showed PA = 59/24 (37). See below for further details.  Subjective:  Patient in bed, minimal left-sided pleuritic chest pain at the site of chest tube, no shortness of breath or cough, no fever or chills, no abdominal pain or weakness.  Assessment/Plan:   Sepsis due to recurrent left-sided empyema. Sepsis pathophysiology resolved, Now afebrile. Blood cultures negative, pleural fluid culture positive for coag neg staph-continue empiric vancomycin. ID consulted, recommendations are to continue vancomycin at least until 4/4-when patient has follow-up at the ID clinic, further decision to prolong or discontinue antibiotics will be made on that visit. PICC line placed on 3/14. Will need weekly CBC, CMP, Vanco trough levels faxed to the infectious disease clinic when discharged.  He has also been seen by cardiothoracic surgery, underwent CT-guided chest tube placement on 3/10. Pleural fluid cultures positive for coag neg staph-continue empiric  vancomycin.CTVS/IR following-and managing chest tube. Chest tube now to water seal, likely to be removed on 01/22/2016 or 01/23/2016.   Pulmonary hypertension: Echocardiogram confirms severe Pul HTN-PA pressure of 63mm Hg. RA,ANA, ANCA serology negative. Recent HIV test was negative as well. VQ scan low probability for PE, lower extremity Dopplers negative for DVT. CHF team consulted-underwent RHC on 3/16 which showed PA = 59/24 (37). Recommendations are to  assess need for home O2 prior to discharge.If he complies with home O2 cardiology plans to re-evaluate PA pressures in 3-6 months. If they remain elevated and he follows up with cardiology-then can consider trial of selective pulmonary vasodilator like sildenafil. Needs outpatient cardiology follow-up upon discharge.    Disposition: Remain inpatient-home once cleared by CTVS  Antimicrobial agents  See below  Anti-infectives    Start     Dose/Rate Route Frequency Ordered Stop   01/18/16 1100  vancomycin (VANCOCIN) IVPB 750 mg/150 ml premix     750 mg 150 mL/hr over 60 Minutes Intravenous Every 12 hours 01/18/16 1032     01/17/16 1000  vancomycin (VANCOCIN) IVPB 750 mg/150 ml premix  Status:  Discontinued     750 mg 150 mL/hr over 60 Minutes Intravenous Every 12 hours 01/17/16 0918 01/18/16 1032   01/14/16 0800  vancomycin (VANCOCIN) IVPB 1000 mg/200 mL premix  Status:  Discontinued     1,000 mg 200 mL/hr over 60 Minutes Intravenous Every 24 hours 01/14/16 0723 01/17/16 0918   01/13/16 1830  vancomycin (VANCOCIN) IVPB 750 mg/150 ml premix  Status:  Discontinued     750 mg 150 mL/hr over 60 Minutes Intravenous  Once 01/13/16 1820 01/13/16 1821   01/13/16 1830  vancomycin (VANCOCIN) IVPB 1000 mg/200  mL premix  Status:  Discontinued     1,000 mg 200 mL/hr over 60 Minutes Intravenous Every 24 hours 01/13/16 1822 01/14/16 0723   01/12/16 2330  vancomycin (VANCOCIN) IVPB 1000 mg/200 mL premix  Status:  Discontinued     1,000 mg 200 mL/hr  over 60 Minutes Intravenous Every 24 hours 01/11/16 2256 01/12/16 1519   01/12/16 0600  piperacillin-tazobactam (ZOSYN) IVPB 3.375 g  Status:  Discontinued     3.375 g 12.5 mL/hr over 240 Minutes Intravenous 3 times per day 01/11/16 2256 01/15/16 1237   01/11/16 2300  vancomycin (VANCOCIN) IVPB 1000 mg/200 mL premix     1,000 mg 200 mL/hr over 60 Minutes Intravenous STAT 01/11/16 2254 01/12/16 0330   01/11/16 2100  vancomycin (VANCOCIN) IVPB 750 mg/150 ml premix  Status:  Discontinued     750 mg 150 mL/hr over 60 Minutes Intravenous  Once 01/11/16 2055 01/11/16 2254   01/11/16 2045  piperacillin-tazobactam (ZOSYN) IVPB 3.375 g     3.375 g 100 mL/hr over 30 Minutes Intravenous  Once 01/11/16 2044 01/12/16 0406   01/11/16 2045  vancomycin (VANCOCIN) IVPB 1000 mg/200 mL premix  Status:  Discontinued     1,000 mg 200 mL/hr over 60 Minutes Intravenous  Once 01/11/16 2044 01/11/16 2055      DVT Prophylaxis: Prophylactic Lovenox   Code Status: Full code   Family Communication None at bedside  Procedures: L. Chest tube placement  CONSULTS:  ID and Cardiothoracic surgery  Time spent 25 minutes-Greater than 50% of this time was spent in counseling, explanation of diagnosis, planning of further management, and coordination of care.  MEDICATIONS: Scheduled Meds: . enoxaparin (LOVENOX) injection  20 mg Subcutaneous Q24H  . guaiFENesin  600 mg Oral BID  . pantoprazole  40 mg Oral Daily  . sodium chloride flush  3 mL Intravenous Q12H  . vancomycin  750 mg Intravenous Q12H   Continuous Infusions:  PRN Meds:.sodium chloride, sodium chloride, [DISCONTINUED] acetaminophen **OR** acetaminophen, acetaminophen, benzonatate, HYDROcodone-acetaminophen, ipratropium-albuterol, ondansetron (ZOFRAN) IV, ondansetron **OR** [DISCONTINUED] ondansetron (ZOFRAN) IV, sodium chloride, sodium chloride flush, sodium chloride flush, technetium TC 58M diethylenetriame-pentaacetic acid    PHYSICAL  EXAM: Vital signs in last 24 hours: Filed Vitals:   01/21/16 0359 01/21/16 1410 01/21/16 2034 01/22/16 0508  BP: 116/68 117/77 104/71 111/70  Pulse: 79 88 85 86  Temp: 98.2 F (36.8 C) 98.8 F (37.1 C) 98 F (36.7 C) 98.1 F (36.7 C)  TempSrc: Oral Oral Oral Oral  Resp: 18 18 18 18   Height:      Weight:      SpO2: 96% 96% 96% 94%    Weight change:  Filed Weights   01/15/16 1926 01/17/16 1600  Weight: 41.731 kg (92 lb) 38.3 kg (84 lb 7 oz)   Body mass index is 16.49 kg/(m^2).   Gen Exam: Awake and alert with clear speech.  Neck: Supple, No JVD.   Chest: B/L Clear.  L. Chest tube in place. CVS: S1 S2 Regular, no murmurs.  Abdomen: soft, BS +, non tender, non distended.  Extremities: no edema, lower extremities warm to touch. Neurologic: Non Focal.   Skin: No Rash.   Wounds: N/A.    Intake/Output from previous day:  Intake/Output Summary (Last 24 hours) at 01/22/16 1138 Last data filed at 01/22/16 0731  Gross per 24 hour  Intake      0 ml  Output     30 ml  Net    -30 ml  LAB RESULTS: CBC  Recent Labs Lab 01/18/16 0450 01/18/16 1030 01/22/16 0424  WBC 6.4 6.8 7.6  HGB 11.3* 11.7* 10.6*  HCT 37.0* 38.7* 35.0*  PLT 265 242 376  MCV 85.5 85.8 83.5  MCH 26.1 25.9* 25.3*  MCHC 30.5 30.2 30.3  RDW 14.3 14.4 14.6    Chemistries   Recent Labs Lab 01/18/16 0450 01/18/16 1030 01/22/16 0424  NA 137  --  139  K 4.0  --  3.8  CL 92*  --  101  CO2 39*  --  29  GLUCOSE 96  --  90  BUN 6  --  11  CREATININE 0.75 0.73 0.94  CALCIUM 8.2*  --  8.4*    CBG: No results for input(s): GLUCAP in the last 168 hours.  GFR Estimated Creatinine Clearance: 49.2 mL/min (by C-G formula based on Cr of 0.94).  Coagulation profile No results for input(s): INR, PROTIME in the last 168 hours.  Cardiac Enzymes No results for input(s): CKMB, TROPONINI, MYOGLOBIN in the last 168 hours.  Invalid input(s): CK  Invalid input(s): POCBNP No results for input(s):  DDIMER in the last 72 hours. No results for input(s): HGBA1C in the last 72 hours. No results for input(s): CHOL, HDL, LDLCALC, TRIG, CHOLHDL, LDLDIRECT in the last 72 hours. No results for input(s): TSH, T4TOTAL, T3FREE, THYROIDAB in the last 72 hours.  Invalid input(s): FREET3 No results for input(s): VITAMINB12, FOLATE, FERRITIN, TIBC, IRON, RETICCTPCT in the last 72 hours. No results for input(s): LIPASE, AMYLASE in the last 72 hours.  Urine Studies No results for input(s): UHGB, CRYS in the last 72 hours.  Invalid input(s): UACOL, UAPR, USPG, UPH, UTP, UGL, UKET, UBIL, UNIT, UROB, ULEU, UEPI, UWBC, URBC, UBAC, CAST, UCOM, BILUA  MICROBIOLOGY: Recent Results (from the past 240 hour(s))  Culture, body fluid-bottle     Status: None   Collection Time: 01/12/16  1:48 PM  Result Value Ref Range Status   Specimen Description FLUID LEFT PLEURAL  Final   Special Requests BOTTLES DRAWN AEROBIC AND ANAEROBIC 5CC  Final   Gram Stain   Final    GRAM POSITIVE COCCI IN CLUSTERS IN BOTH AEROBIC AND ANAEROBIC BOTTLES CRITICAL RESULT CALLED TO, READ BACK BY AND VERIFIED WITH: A BURTON 01/13/16 @ 35 M VESTAL    Culture STAPHYLOCOCCUS SPECIES (COAGULASE NEGATIVE)  Final   Report Status 01/15/2016 FINAL  Final   Organism ID, Bacteria STAPHYLOCOCCUS SPECIES (COAGULASE NEGATIVE)  Final      Susceptibility   Staphylococcus species (coagulase negative) - MIC*    CIPROFLOXACIN <=0.5 SENSITIVE Sensitive     ERYTHROMYCIN >=8 RESISTANT Resistant     GENTAMICIN <=0.5 SENSITIVE Sensitive     OXACILLIN >=4 RESISTANT Resistant     TETRACYCLINE >=16 RESISTANT Resistant     VANCOMYCIN 1 SENSITIVE Sensitive     TRIMETH/SULFA <=10 SENSITIVE Sensitive     CLINDAMYCIN <=0.25 SENSITIVE Sensitive     RIFAMPIN <=0.5 SENSITIVE Sensitive     Inducible Clindamycin NEGATIVE Sensitive     * STAPHYLOCOCCUS SPECIES (COAGULASE NEGATIVE)  Gram stain     Status: None   Collection Time: 01/12/16  1:48 PM  Result Value  Ref Range Status   Specimen Description FLUID LEFT PLEURAL  Final   Special Requests NONE  Final   Gram Stain   Final    ABUNDANT WBC PRESENT,BOTH PMN AND MONONUCLEAR NO ORGANISMS SEEN    Report Status 01/12/2016 FINAL  Final    RADIOLOGY STUDIES/RESULTS: Dg Chest  1 View  01/12/2016  CLINICAL DATA:  Patient status post catheter placement within the left chest. EXAM: CHEST 1 VIEW COMPARISON:  Chest radiograph 01/11/2016 FINDINGS: Stable cardiac and mediastinal contours with marked enlargement of the main pulmonary artery. Unchanged scattered bilateral heterogeneous pulmonary opacities, likely scarring. Interval placement of left pleural drainage catheter into a loculated left hydropneumothorax. Regional skeleton is unremarkable. IMPRESSION: Interval placement left pigtail drainage catheter into loculated left hydropneumothorax. Electronically Signed   By: Lovey Newcomer M.D.   On: 01/12/2016 14:23   Nm Pulmonary Perf And Vent  01/15/2016  CLINICAL DATA:  Pulmonary hypertension. Possible chronic PE. Left-sided chest tube in place for empyema. EXAM: NUCLEAR MEDICINE VENTILATION - PERFUSION LUNG SCAN TECHNIQUE: Ventilation images were obtained in multiple projections using inhaled aerosol Tc-30m DTPA. Perfusion images were obtained in multiple projections after intravenous injection of Tc-47m MAA. RADIOPHARMACEUTICALS:  31.1 Technetium-2m DTPA aerosol inhalation and 4.1 Technetium-56m MAA IV COMPARISON:  Chest CT 12/14/2015 FINDINGS: Ventilation: Very limited ventilation examination. Central deposition of the radiopharmaceutical and limited visualization of lungs. Perfusion: Small left hemi thorax due to a large complex left pleural fluid collection. No definite segmental or subsegmental perfusion defects to suggest pulmonary embolism. IMPRESSION: Limited examination due to the limited ventilation study. Small left hemi thorax due to a left pleural fluid collection. No findings suspicious for pulmonary  embolism. Electronically Signed   By: Marijo Sanes M.D.   On: 01/15/2016 15:35   Dg Chest Port 1 View  01/22/2016  CLINICAL DATA:  Empyema. EXAM: PORTABLE CHEST 1 VIEW COMPARISON:  01/20/2016. FINDINGS: Right PICC line, left chest tube in stable position. Persistent small loculated left base hydro pneumothorax. Stable right base pleural thickening noted consistent with scarring. Calcified pleural plaques again noted. Stable cardiomegaly. No acute bony abnormality . IMPRESSION: 1. Right PICC line and left chest tube in stable position. Stable small left base loculated hydro pneumothorax and left pleural thickening . 2. Calcified pleural plaques again noted. Electronically Signed   By: Marcello Moores  Register   On: 01/22/2016 07:46   Dg Chest Port 1 View  01/20/2016  CLINICAL DATA:  Empyema EXAM: PORTABLE CHEST 1 VIEW COMPARISON:  01/18/2016 chest radiograph. FINDINGS: Right PICC terminates at the cavoatrial junction. Left chest tube is in place. Stable cardiomediastinal silhouette with normal heart size. Stable small loculated basilar left hydropneumothorax. Stable minimal blunting of the right costophrenic angle. Stable patchy consolidation at the left lung base. Stable calcified pleural plaques at both lung bases. No pulmonary edema. IMPRESSION: 1. Stable small loculated left basilar hydropneumothorax. 2. Stable minimal blunting of the right costophrenic angle, either trace right pleural effusion and/or pleural-parenchymal scarring. 3. Stable calcified pleural plaques at both lung bases. 4. Stable patchy left lung base consolidation. Electronically Signed   By: Ilona Sorrel M.D.   On: 01/20/2016 10:39   Dg Chest Port 1 View  01/18/2016  CLINICAL DATA:  Empyema with chest tube. EXAM: PORTABLE CHEST 1 VIEW COMPARISON:  01/17/2016 FINDINGS: Right-sided PICC line unchanged with tip just below the cavoatrial junction. Left-sided chest tube unchanged. Lungs are adequately inflated. Stable to slight worsening  opacification of the left base and lateral mid to lower left lung compatible with known pleural fluid collection/empyema. Vertical sliver of air over the lateral left base improved and thought to represent improving component of pleural air. Cardiomediastinal silhouette and remainder the exam is unchanged to include a small amount right pleural fluid as well as prominent bilateral pulmonary arteries. IMPRESSION: Stable to slightly worse left base  opacification compatible with known pleural fluid collection/empyema. Improved sliver of pleural air in the lateral left base. Left-sided chest tube unchanged. Stable small right pleural effusion. Tubes and lines as described. Electronically Signed   By: Marin Olp M.D.   On: 01/18/2016 08:17   Dg Chest Port 1 View  01/17/2016  CLINICAL DATA:  Emphysema.  Chest tube. EXAM: PORTABLE CHEST 1 VIEW COMPARISON:  01/12/2016 FINDINGS: Stable left chest tube. Tiny pneumothorax at the left base improved. Pleural thickening and volume loss in the left hemi thorax is stable. Right lung is stable in appearance. There is opacity inferior to the right hilum and in the peripheral right lower lung zone. Small right pleural effusion is suspected. Right PICC placed. Tip is at the cavoatrial junction. IMPRESSION: Stable left chest tube.  Tiny left basilar pneumothorax is improved. Right upper extremity PICC place with its tip at the cavoatrial junction Otherwise stable study. Electronically Signed   By: Marybelle Killings M.D.   On: 01/17/2016 10:10   Dg Chest Port 1 View  01/11/2016  CLINICAL DATA:  Sepsis EXAM: PORTABLE CHEST 1 VIEW COMPARISON:  CT chest 12/14/2015.  Chest x-ray 11/23/2015 FINDINGS: Left pleural loculated fluid and pleural scarring unchanged from the prior study. Prior CT demonstrated air-fluid level consistent with hydro pneumothorax. This is difficult to see on the chest x-ray but probably remains. 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 Underlying chronic  lung disease with COPD Marked pulmonary artery enlargement with pulmonary artery hypertension again noted. IMPRESSION: Loculated left pleural effusion and pleural scarring unchanged with probable hydro pneumothorax on the left as seen on the prior CT of 12/14/2015. Severe pulmonary artery hypertension. No new findings compared with earlier studies. Electronically Signed   By: Franchot Gallo M.D.   On: 01/11/2016 21:18   Ct Perc Pleural Drain W/indwell Cath W/img Guide  01/12/2016  INDICATION: 55 year old male with concern for recurrent left-sided empyema. CT-guided drainage is requested. EXAM: CT PERC PLEURAL DRAIN W/INDWELL CATH W/IMG GUIDE MEDICATIONS: The patient is currently admitted to the hospital and receiving intravenous antibiotics. The antibiotics were administered within an appropriate time frame prior to the initiation of the procedure. ANESTHESIA/SEDATION: Fentanyl  mcg IV; Versed  mg IV Moderate Sedation Time: The patient was continuously monitored during the procedure by the interventional radiology nurse under my direct supervision. COMPLICATIONS: None immediate. Estimated blood loss: 0 PROCEDURE: Informed written consent was obtained from the patient after a thorough discussion of the procedural risks, benefits and alternatives. All questions were addressed. A timeout was performed prior to the initiation of the procedure. A planning axial CT scan was performed. A thick-walled loculated hydro pneumothorax is identified on the left. A suitable skin entry site was selected and marked. The region was sterilely prepped and draped in standard fashion using chlorhexidine skin prep. Local anesthesia was attained by infiltration with 1% lidocaine. A small dermatotomy was placed. Under intermittent CT guidance an 18 gauge trocar needle was advanced through the rib space and into the empyema. An Amplatz wire was then advanced through the trocar needle and directed toward the lung apex. The needle was  removed. The skin tract was dilated to 75 [place] and a Cook 14 [place] all-purpose drainage catheter modified with multiple additional sideholes was advanced over the wire. The drain was connected to low wall suction via a pleura vac. Repeat CT imaging demonstrates excellent placement of the drain. All additional sideholes are within the pleural space and spanned the entirety of the empyema. Aspiration yields approximately  30 mL turbid bloody fluid. This was sent to the lab for culture. The tube was secured to the skin with 0 Prolene suture. An air tight bandage was applied. The patient tolerated the procedure well. IMPRESSION: Successful placement of a 57 French all-purpose drainage catheter modified with additional sideholes into the thick walled left-sided hydro pneumothorax. The aspirated material appears to be bloody. Signed, Criselda Peaches, MD [facility] Electronically Signed   By: Jacqulynn Cadet M.D.   On: 01/12/2016 14:32    Thurnell Lose, MD  [facility] Pager:[phone]  If 7PM-7AM, please contact night-coverage [email or web address] Password [email or web address] 01/22/2016, 11:38 AM   LOS: 11 days

## 2016-01-22 NOTE — Progress Notes (Signed)
SATURATION QUALIFICATIONS: (This note is used to comply with regulatory documentation for home oxygen)  Patient Saturations on Room Air at Rest = 94%  Patient Saturations on Room Air while Ambulating = 88%    Please briefly explain why patient needs home oxygen:  No signs of SOB with ambulation and did not drop below 88% range was 92-88% and increased with cues for pursed lip breathing. [place], [place] 01/22/2016

## 2016-01-22 NOTE — Progress Notes (Signed)
Chest tube site dressing saturated with serosanguinous fluid, dressing changed will monitor patient. Montasia Chisenhall, Bettina Gavia RN

## 2016-01-22 NOTE — Progress Notes (Signed)
Utilization review completed.  

## 2016-01-22 NOTE — Progress Notes (Addendum)
      [name]       [place] [address]             [phone]      4 Days Post-Op Procedure(s) (LRB): Right Heart Cath (N/A) Subjective: Some productive cough, not hemoptysis currently  Objective: Vital signs in last 24 hours: Temp:  [98 F (36.7 C)-98.8 F (37.1 C)] 98.1 F (36.7 C) (03/20 0508) Pulse Rate:  [85-88] 86 (03/20 0508) Cardiac Rhythm:  [-] Normal sinus rhythm (03/20 0731) Resp:  [18] 18 (03/20 0508) BP: (104-117)/(70-77) 111/70 mmHg (03/20 0508) SpO2:  [94 %-96 %] 94 % (03/20 0508)  Hemodynamic parameters for last 24 hours:    Intake/Output from previous day: 03/19 0701 - 03/20 0700 In: 300 [IV Piggyback:300] Out: 10 [Chest Tube:10] Intake/Output this shift: Total I/O In: -  Out: 30 [Chest Tube:30]  General appearance: alert, cooperative and no distress Heart: regular rate and rhythm Lungs: mildly dim in left base Abdomen: benign Extremities: no edema Wound: dressings CDI  Lab Results:  Recent Labs  01/22/16 0424  WBC 7.6  HGB 10.6*  HCT 35.0*  PLT 376   BMET:  Recent Labs  01/22/16 0424  NA 139  K 3.8  CL 101  CO2 29  GLUCOSE 90  BUN 11  CREATININE 0.94  CALCIUM 8.4*    PT/INR: No results for input(s): LABPROT, INR in the last 72 hours. ABG    Component Value Date/Time   PHART 7.336* 01/18/2016 0934   HCO3 41.3* 01/18/2016 0939   TCO2 44 01/18/2016 0939   O2SAT 68.0 01/18/2016 0939   CBG (last 3)  No results for input(s): GLUCAP in the last 72 hours.  Meds Scheduled Meds: . enoxaparin (LOVENOX) injection  20 mg Subcutaneous Q24H  . guaiFENesin  600 mg Oral BID  . pantoprazole  40 mg Oral Daily  . sodium chloride flush  3 mL Intravenous Q12H  . vancomycin  750 mg Intravenous Q12H   Continuous Infusions:  PRN Meds:.sodium chloride, sodium chloride, [DISCONTINUED] acetaminophen **OR** acetaminophen, acetaminophen, benzonatate, HYDROcodone-acetaminophen, ipratropium-albuterol, ondansetron  (ZOFRAN) IV, ondansetron **OR** [DISCONTINUED] ondansetron (ZOFRAN) IV, sodium chloride, sodium chloride flush, sodium chloride flush, technetium TC 7M diethylenetriame-pentaacetic acid  Xrays Dg Chest Port 1 View  01/22/2016  CLINICAL DATA:  Empyema. EXAM: PORTABLE CHEST 1 VIEW COMPARISON:  01/20/2016. FINDINGS: Right PICC line, left chest tube in stable position. Persistent small loculated left base hydro pneumothorax. Stable right base pleural thickening noted consistent with scarring. Calcified pleural plaques again noted. Stable cardiomegaly. No acute bony abnormality . IMPRESSION: 1. Right PICC line and left chest tube in stable position. Stable small left base loculated hydro pneumothorax and left pleural thickening . 2. Calcified pleural plaques again noted. Electronically Signed   By: Marcello Moores  Register   On: 01/22/2016 07:46    Assessment/Plan: S/P Procedure(s) (LRB): Right Heart Cath (N/A)  1 stable, no air leak, minimal drainage- poss d/c tube soon 2 afebrile , no leukocytosis- conts vanco 3 hemodyn stable in sinus rhythm 4 push rehab and pulm toilet as able     LOS: 11 days    GOLD,[name] 01/22/2016  Chest tube now non functional, no drainage or tidling  Will d/c today I have seen and examined [name] [name] and agree with the above assessment  and plan.  [name] MD [facility] [phone] Office [phone] 01/22/2016 1:07 PM

## 2016-01-22 NOTE — Evaluation (Signed)
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 Underwent chest tube placement by IR, total foot cultures positive for coagulase negative Staphylococcus.  Clinical Impression  Patient presents at modified independent level for mobility.  Mobilizing well without devices, attempting to manage his own chest tube and IV pole.  Feel patient is without current acute skilled PT needs.  Will sign off.  Of noted walking O2 sats measured, see note to follow for vitals measurements.    Follow Up Recommendations No PT follow up    Equipment Recommendations  None recommended by PT    Recommendations for Other Services       Precautions / Restrictions Precautions Precaution Comments: currently with R chest tube      Mobility  Bed Mobility               General bed mobility comments: NT pt up in chair  Transfers Overall transfer level: Modified independent                  Ambulation/Gait Ambulation/Gait assistance: Independent Ambulation Distance (Feet): 250 Feet Assistive device: None Gait Pattern/deviations: Step-through pattern;Decreased stride length     General Gait Details: slightly flexed with R chest tube; kept on room air  Stairs            Wheelchair Mobility    Modified Rankin (Stroke Patients Only)       Balance Overall balance assessment: No apparent balance deficits (not formally assessed)                                           Pertinent Vitals/Pain Pain Assessment: Faces Faces Pain Scale: Hurts a little bit Pain Location: R chest tube site Pain Descriptors / Indicators: Other (Comment) (itchinh) Pain Intervention(s):  Monitored during session    Home Living Family/patient expects to be discharged to:: Private residence Living Arrangements: Other relatives (neice) Available Help at Discharge: Family Type of Home: House Home Access: Level entry     Home Layout: One level Home Equipment: None      Prior Function Level of Independence: Independent               Hand Dominance        Extremity/Trunk Assessment   Upper Extremity Assessment: Overall WFL for tasks assessed           Lower Extremity Assessment: Overall WFL for tasks assessed         Communication   Communication: Prefers language other than English (but communicated well in English this session)  Cognition Arousal/Alertness: Awake/alert Behavior During Therapy: WFL for tasks assessed/performed Overall Cognitive Status: Within Functional Limits for tasks assessed                      General Comments      Exercises        Assessment/Plan    PT Assessment Patent does not need any further PT services  PT Diagnosis Generalized weakness   PT Problem List    PT Treatment Interventions     PT Goals (Current goals can be found in the Care Plan  section) Acute Rehab PT Goals PT Goal Formulation: All assessment and education complete, DC therapy    Frequency     Barriers to discharge        Co-evaluation               End of Session   Activity Tolerance: Patient tolerated treatment well Patient left: in chair;with call bell/phone within reach           Time: 1400-1422 PT Time Calculation (min) (ACUTE ONLY): 22 min   Charges:   PT Evaluation $PT Eval Low Complexity: 1 Procedure     PT G CodesReginia Naas Feb 12, 2016, 2:35 PM  Magda Kiel, [place] 02-12-2016

## 2016-01-22 NOTE — Progress Notes (Addendum)
Patient requesting medication for cough, Benzonatate capsules given as ordered as needed for cough. Will monitor patient [name] [name], Bettina Gavia RN

## 2016-01-23 ENCOUNTER — Inpatient Hospital Stay (HOSPITAL_COMMUNITY): Payer: BLUE CROSS/BLUE SHIELD

## 2016-01-23 DIAGNOSIS — J90 Pleural effusion, not elsewhere classified: Secondary | ICD-10-CM

## 2016-01-23 MED ORDER — HEPARIN SOD (PORK) LOCK FLUSH 100 UNIT/ML IV SOLN
250.0000 [IU] | INTRAVENOUS | Status: AC | PRN
  Administered 2016-01-23: 250 [IU]

## 2016-01-23 MED ORDER — GUAIFENESIN ER 600 MG PO TB12: 600.0000 mg | ORAL_TABLET | Freq: Two times a day (BID) | ORAL | Status: DC

## 2016-01-23 MED ORDER — VANCOMYCIN HCL IN DEXTROSE 750-5 MG/150ML-% IV SOLN: 750.0000 mg | Freq: Two times a day (BID) | INTRAVENOUS | Status: DC

## 2016-01-23 NOTE — Progress Notes (Signed)
Pt given discharge instructions, medication lists, follow up appointments, and when to call the doctor.  Pt verbalizes understanding. Interpreter went over all instructions and all questions answered. Payton Emerald, RN

## 2016-01-23 NOTE — Discharge Instructions (Signed)

## 2016-01-23 NOTE — Discharge Summary (Signed)
[name] [name], is a 55 y.o. male  DOB 09-19-1961  MRN [id number].  Admission date:  01/11/2016  Admitting Physician  Geradine Girt, DO  Discharge Date:  01/23/2016   Primary MD  No primary care provider on file.  Recommendations for primary care physician for things to follow:   Check vancomycin levels, CBC CMP twice a week until vancomycin was stopped. Repeat 2 view chest x-ray in a week.  Outpatient ID, cardiology and cardiothoracic surgery follow-up within 1-2 weeks.   Admission Diagnosis  hypoxia chronic empymea hypertension pulmonary hypertension   Discharge Diagnosis  hypoxia chronic empymea hypertension pulmonary hypertension     Principal Problem:   SIRS (systemic inflammatory response syndrome) (HCC) Active Problems:   Pleural effusion, left   Chest pain   Empyema, left (HCC)   Pulmonary hypertension (HCC)   Hyperkalemia   Sepsis ([place])   Pulmonary HTN ([place])      Past Medical History  Diagnosis Date  . Kidney stones   . Hypertension     Past Surgical History  Procedure Laterality Date  . Kidney surgery    . Video assisted thoracoscopy (vats)/decortication Left 2003  . Video assisted thoracoscopy (vats)/decortication Left 10/02/2015    Procedure: VIDEO ASSISTED THORACOSCOPY (VATS)/DECORTICATION and drainage of chronic empyena;  Surgeon: [name], MD;  Location: [place];  Service: Thoracic;  Laterality: Left;  . Video bronchoscopy N/A 10/02/2015    Procedure: VIDEO BRONCHOSCOPY;  Surgeon: [name], MD;  Location: [place];  Service: Thoracic;  Laterality: N/A;  . 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 Underwent chest tube placement by IR, total foot cultures positive for coagulase negative Staphylococcus. Infectious disease recommending IV vancomycin to 4/4-1 patient will be seen at the ID clinic. Cardiothoracic surgery following-currently still with chest tube in place. Separately, patient also has been experiencing exertional dyspnea that has been gradually worsening over the past few years, he claims that even walking 20 yards will now make him short of breath. Echocardiogram this admission shows a PA pressure of 81 mmHg-underwent RHC which showed PA = 59/24 (37). See below for further details.     Hospital Course:     Sepsis due to recurrent left-sided empyema. Sepsis pathophysiology resolved, Now afebrile. Blood cultures negative, pleural fluid culture positive for coag neg staph-continue empiric vancomycin. ID consulted, recommendations are to continue vancomycin at least until 4/4-when patient has follow-up at the ID clinic, further decision to prolong or discontinue antibiotics will be made on that visit. PICC line placed on 3/14. Will Twice weekly CBC, CMP, Vanco trough levels faxed  to the infectious disease clinic when discharged.  He was also been seen by cardiothoracic surgery, underwent CT-guided chest tube placement on 3/10. Pleural fluid cultures positive for coag neg staph-continue empiric vancomycin. Cardio thoracic surgery removed his chest tube on 01/22/2016, he was cleared for home discharge, will get vancomycin through a PICC line by home health RN. Request PCP to check CBC, CMP and vancomycin trough levels twice a weekly will be drawn by home health RN. Discontinue PICC line once vancomycin has been stopped on 02/06/2016.   Pulmonary  hypertension: Echocardiogram confirms severe Pul HTN-PA pressure of 66mm Hg. RA,ANA, ANCA serology negative. Recent HIV test was negative as well. VQ scan low probability for PE, lower extremity Dopplers negative for DVT. CHF team consulted-underwent RHC on 3/16 which showed PA = 59/24 (37). Not require home oxygen and ambulated on room air with pulse ox mid 90s. Will follow with cardiology outpatient for pulmonary hypertension along with pulmonary.        Discharge Condition: Stable  Follow UP  Follow-up Information    Follow up with [name], MD. Schedule an appointment as soon as possible for a visit in 1 week.   Specialty:  Cardiothoracic Surgery   Contact information:   [address] [place] [address] [phone]       Follow up with Michel Bickers, MD. Schedule an appointment as soon as possible for a visit in 1 week.   Specialty:  Infectious Diseases   Contact information:   [address] [place] [address] [phone]       Follow up with [facility], MD. Schedule an appointment as soon as possible for a visit in 1 week.   Specialty:  Pulmonary Disease   Why:  Pulm HTN   Contact information:   [place] [address] [phone]       Follow up with [facility]. Go on 01/31/2016.   Why:  f/u appointment made for 5:30 -    Contact information:   [address] [place] [phone]      Follow up with [facility].   Why:  HH-RN/PT/SW arranged- (home IV abx)   Contact information:   [address] [place] [address] [phone]       Follow up with Glori Bickers, MD. Schedule an appointment as soon as possible for a visit in 1 week.   Specialty:  Cardiology   Why:  Pulmonary hypertension   Contact information:   [address] [place] [address] [phone]        Consults  obtained -  ID, cardiothoracic surgery  Diet and Activity recommendation: See Discharge Instructions below  Discharge Instructions           Discharge Instructions    Diet - low sodium heart healthy    Complete by:  As directed      Discharge instructions    Complete by:  As directed   Follow with Primary MD in 7 days   Get CBC, CMP, 2 view Chest X ray checked  by Primary MD next visit.    Activity: As tolerated with Full fall precautions use walker/cane & assistance as needed   Disposition Home     Diet:   Heart Healthy   For Heart failure patients - Check your Weight same time everyday, if you gain over 2 pounds, or you develop  in leg swelling, experience more shortness of breath or chest pain, call your Primary MD immediately. Follow Cardiac Low Salt Diet and 1.5 lit/day fluid restriction.   On your next visit with your primary care physician please Get Medicines reviewed and adjusted.   Please request your Prim.MD to go over all Hospital Tests and Procedure/Radiological results at the follow up, please get all Hospital records sent to your Prim MD by signing hospital release before you go home.   If you experience worsening of your admission symptoms, develop shortness of breath, life threatening emergency, suicidal or homicidal thoughts you must seek medical attention immediately by calling 911 or calling your MD immediately  if symptoms less severe.  You Must read complete instructions/literature along with all the possible adverse reactions/side effects for all the Medicines you take and that have been prescribed to you. Take any new Medicines after you have completely understood and accpet all the possible adverse reactions/side effects.   Do not drive, operating heavy machinery, perform activities at heights, swimming or participation in water activities or provide baby sitting services if your were admitted for syncope or siezures until you have seen by Primary MD or  a Neurologist and advised to do so again.  Do not drive when taking Pain medications.    Do not take more than prescribed Pain, Sleep and Anxiety Medications  Special Instructions: If you have smoked or chewed Tobacco  in the last 2 yrs please stop smoking, stop any regular Alcohol  and or any Recreational drug use.  Wear Seat belts while driving.   Please note  You were cared for by a hospitalist during your hospital stay. If you have any questions about your discharge medications or the care you received while you were in the hospital after you are discharged, you can call the unit and asked to speak with the hospitalist on call if the hospitalist that took care of you is not available. Once you are discharged, your primary care physician will handle any further medical issues. Please note that NO REFILLS for any discharge medications will be authorized once you are discharged, as it is imperative that you return to your primary care physician (or establish a relationship with a primary care physician if you do not have one) for your aftercare needs so that they can reassess your need for medications and monitor your lab values.     Increase activity slowly    Complete by:  As directed              Discharge Medications       Medication List    TAKE these medications        guaiFENesin 600 MG 12 hr tablet  Commonly known as:  MUCINEX  Take 1 tablet (600 mg total) by mouth 2 (two) times daily.     Vancomycin 750 MG/150ML Soln  Commonly known as:  VANCOCIN  Inject 150 mLs (750 mg total) into the vein every 12 (twelve) hours. Please provide supplies lasting for 02/06/2016 which is to stop date. Home health RN to draw CBC, CMP, Vanco trough levels faxed to the infectious disease clinic when discharged.        Major procedures and Radiology Reports - PLEASE review detailed and final reports for all details, in brief -   Left chest tube placement which was removed on  01/22/2016   Dg Chest 1 View  01/12/2016  CLINICAL DATA:  Patient status post catheter placement within the left  chest. EXAM: CHEST 1 VIEW COMPARISON:  Chest radiograph 01/11/2016 FINDINGS: Stable cardiac and mediastinal contours with marked enlargement of the main pulmonary artery. Unchanged scattered bilateral heterogeneous pulmonary opacities, likely scarring. Interval placement of left pleural drainage catheter into a loculated left hydropneumothorax. Regional skeleton is unremarkable. IMPRESSION: Interval placement left pigtail drainage catheter into loculated left hydropneumothorax. Electronically Signed   By: Lovey Newcomer M.D.   On: 01/12/2016 14:23   Dg Chest 2 View  01/23/2016  CLINICAL DATA:  Chest tube removal. EXAM: CHEST  2 VIEW COMPARISON:  01/22/2016 . FINDINGS: Interval removal of left chest tube. Right PICC line stable position. Improvement of loculated left base pneumothorax. Stable left pleural thickening. No progressive pleural effusion. Small right pleural effusion. Calcified pleural plaques again noted. Heart size stable. No acute bony abnormality P IMPRESSION: 1. Interim removal of left chest tube. Small left base hydro pneumothorax has improved. Stable left pleural thickening noted. 2. Right PICC line stable position. 3. Calcified pleural plaques again noted. Small right pleural effusion again noted . Electronically Signed   By: Marcello Moores  Register   On: 01/23/2016 08:55   Nm Pulmonary Perf And Vent  01/15/2016  CLINICAL DATA:  Pulmonary hypertension. Possible chronic PE. Left-sided chest tube in place for empyema. EXAM: NUCLEAR MEDICINE VENTILATION - PERFUSION LUNG SCAN TECHNIQUE: Ventilation images were obtained in multiple projections using inhaled aerosol Tc-64m DTPA. Perfusion images were obtained in multiple projections after intravenous injection of Tc-71m MAA. RADIOPHARMACEUTICALS:  31.1 Technetium-18m DTPA aerosol inhalation and 4.1 Technetium-48m MAA IV COMPARISON:  Chest CT  12/14/2015 FINDINGS: Ventilation: Very limited ventilation examination. Central deposition of the radiopharmaceutical and limited visualization of lungs. Perfusion: Small left hemi thorax due to a large complex left pleural fluid collection. No definite segmental or subsegmental perfusion defects to suggest pulmonary embolism. IMPRESSION: Limited examination due to the limited ventilation study. Small left hemi thorax due to a left pleural fluid collection. No findings suspicious for pulmonary embolism. Electronically Signed   By: Marijo Sanes M.D.   On: 01/15/2016 15:35   Dg Chest Port 1 View  01/22/2016  CLINICAL DATA:  Empyema. EXAM: PORTABLE CHEST 1 VIEW COMPARISON:  01/20/2016. FINDINGS: Right PICC line, left chest tube in stable position. Persistent small loculated left base hydro pneumothorax. Stable right base pleural thickening noted consistent with scarring. Calcified pleural plaques again noted. Stable cardiomegaly. No acute bony abnormality . IMPRESSION: 1. Right PICC line and left chest tube in stable position. Stable small left base loculated hydro pneumothorax and left pleural thickening . 2. Calcified pleural plaques again noted. Electronically Signed   By: Marcello Moores  Register   On: 01/22/2016 07:46   Dg Chest Port 1 View  01/20/2016  CLINICAL DATA:  Empyema EXAM: PORTABLE CHEST 1 VIEW COMPARISON:  01/18/2016 chest radiograph. FINDINGS: Right PICC terminates at the cavoatrial junction. Left chest tube is in place. Stable cardiomediastinal silhouette with normal heart size. Stable small loculated basilar left hydropneumothorax. Stable minimal blunting of the right costophrenic angle. Stable patchy consolidation at the left lung base. Stable calcified pleural plaques at both lung bases. No pulmonary edema. IMPRESSION: 1. Stable small loculated left basilar hydropneumothorax. 2. Stable minimal blunting of the right costophrenic angle, either trace right pleural effusion and/or pleural-parenchymal  scarring. 3. Stable calcified pleural plaques at both lung bases. 4. Stable patchy left lung base consolidation. Electronically Signed   By: Ilona Sorrel M.D.   On: 01/20/2016 10:39   Dg Chest Port 1 View  01/18/2016  CLINICAL DATA:  Empyema with chest tube. EXAM: PORTABLE CHEST 1 VIEW COMPARISON:  01/17/2016 FINDINGS: Right-sided PICC line unchanged with tip just below the cavoatrial junction. Left-sided chest tube unchanged. Lungs are adequately inflated. Stable to slight worsening opacification of the left base and lateral mid to lower left lung compatible with known pleural fluid collection/empyema. Vertical sliver of air over the lateral left base improved and thought to represent improving component of pleural air. Cardiomediastinal silhouette and remainder the exam is unchanged to include a small amount right pleural fluid as well as prominent bilateral pulmonary arteries. IMPRESSION: Stable to slightly worse left base opacification compatible with known pleural fluid collection/empyema. Improved sliver of pleural air in the lateral left base. Left-sided chest tube unchanged. Stable small right pleural effusion. Tubes and lines as described. Electronically Signed   By: Marin Olp M.D.   On: 01/18/2016 08:17   Dg Chest Port 1 View  01/17/2016  CLINICAL DATA:  Emphysema.  Chest tube. EXAM: PORTABLE CHEST 1 VIEW COMPARISON:  01/12/2016 FINDINGS: Stable left chest tube. Tiny pneumothorax at the left base improved. Pleural thickening and volume loss in the left hemi thorax is stable. Right lung is stable in appearance. There is opacity inferior to the right hilum and in the peripheral right lower lung zone. Small right pleural effusion is suspected. Right PICC placed. Tip is at the cavoatrial junction. IMPRESSION: Stable left chest tube.  Tiny left basilar pneumothorax is improved. Right upper extremity PICC place with its tip at the cavoatrial junction Otherwise stable study. Electronically Signed   By:  Marybelle Killings M.D.   On: 01/17/2016 10:10   Dg Chest Port 1 View  01/11/2016  CLINICAL DATA:  Sepsis EXAM: PORTABLE CHEST 1 VIEW COMPARISON:  CT chest 12/14/2015.  Chest x-ray 11/23/2015 FINDINGS: Left pleural loculated fluid and pleural scarring unchanged from the prior study. Prior CT demonstrated air-fluid level consistent with hydro pneumothorax. This is difficult to see on the chest x-ray but probably remains. 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 Underlying chronic lung disease with COPD Marked pulmonary artery enlargement with pulmonary artery hypertension again noted. IMPRESSION: Loculated left pleural effusion and pleural scarring unchanged with probable hydro pneumothorax on the left as seen on the prior CT of 12/14/2015. Severe pulmonary artery hypertension. No new findings compared with earlier studies. Electronically Signed   By: Franchot Gallo M.D.   On: 01/11/2016 21:18   Ct Perc Pleural Drain W/indwell Cath W/img Guide  01/12/2016  INDICATION: 55 year old male with concern for recurrent left-sided empyema. CT-guided drainage is requested. EXAM: CT PERC PLEURAL DRAIN W/INDWELL CATH W/IMG GUIDE MEDICATIONS: The patient is currently admitted to the hospital and receiving intravenous antibiotics. The antibiotics were administered within an appropriate time frame prior to the initiation of the procedure. ANESTHESIA/SEDATION: Fentanyl  mcg IV; Versed  mg IV Moderate Sedation Time: The patient was continuously monitored during the procedure by the interventional radiology nurse under my direct supervision. COMPLICATIONS: None immediate. Estimated blood loss: 0 PROCEDURE: Informed written consent was obtained from the patient after a thorough discussion of the procedural risks, benefits and alternatives. All questions were addressed. A timeout was performed prior to the initiation of the procedure. A planning axial CT scan was performed. A thick-walled loculated hydro pneumothorax is  identified on the left. A suitable skin entry site was selected and marked. The region was sterilely prepped and draped in standard fashion using chlorhexidine skin prep. Local anesthesia was attained by infiltration with 1% lidocaine. A small dermatotomy was placed. Under  intermittent CT guidance an 18 gauge trocar needle was advanced through the rib space and into the empyema. An Amplatz wire was then advanced through the trocar needle and directed toward the lung apex. The needle was removed. The skin tract was dilated to 35 [place] and a Cook 14 [place] all-purpose drainage catheter modified with multiple additional sideholes was advanced over the wire. The drain was connected to low wall suction via a pleura vac. Repeat CT imaging demonstrates excellent placement of the drain. All additional sideholes are within the pleural space and spanned the entirety of the empyema. Aspiration yields approximately 30 mL turbid bloody fluid. This was sent to the lab for culture. The tube was secured to the skin with 0 Prolene suture. An air tight bandage was applied. The patient tolerated the procedure well. IMPRESSION: Successful placement of a 73 French all-purpose drainage catheter modified with additional sideholes into the thick walled left-sided hydro pneumothorax. The aspirated material appears to be bloody. Signed, Criselda Peaches, MD [facility] Electronically Signed   By: Jacqulynn Cadet M.D.   On: 01/12/2016 14:32    Micro Results      No results found for this or any previous visit (from the past 240 hour(s)).   Today   Subjective    Jailan Sifuentes today has no headache,no chest abdominal pain,no new weakness tingling or numbness, feels much better wants to go home today.    Objective   Blood pressure 125/81, pulse 87, temperature 98.3 F (36.8 C), temperature source Oral, resp. rate 18, height 5' (1.524 m), weight 38.3 kg (84 lb 7 oz),  SpO2 96 %.   Intake/Output Summary (Last 24 hours) at 01/23/16 1035 Last data filed at 01/22/16 1800  Gross per 24 hour  Intake    360 ml  Output      1 ml  Net    359 ml    Exam Awake Alert, Oriented x 3, No new F.N deficits, Normal affect [place].AT,PERRAL Supple Neck,No JVD, No cervical lymphadenopathy appriciated.  Symmetrical Chest wall movement, Good air movement bilaterally, CTAB RRR,No Gallops,Rubs or new Murmurs, No Parasternal Heave +ve B.Sounds, Abd Soft, Non tender, No organomegaly appriciated, No rebound -guarding or rigidity. No Cyanosis, Clubbing or edema, No new Rash or bruise   Data Review   CBC w Diff:  Lab Results  Component Value Date   WBC 7.6 01/22/2016   HGB 10.6* 01/22/2016   HCT 35.0* 01/22/2016   PLT 376 01/22/2016   LYMPHOPCT 31 01/13/2016   MONOPCT 11 01/13/2016   EOSPCT 0 01/13/2016   BASOPCT 0 01/13/2016    CMP:  Lab Results  Component Value Date   NA 139 01/22/2016   K 3.8 01/22/2016   CL 101 01/22/2016   CO2 29 01/22/2016   BUN 11 01/22/2016   CREATININE 0.94 01/22/2016   PROT 7.2 01/15/2016   ALBUMIN 2.3* 01/15/2016   BILITOT 0.2* 01/15/2016   ALKPHOS 60 01/15/2016   AST 24 01/15/2016   ALT 14* 01/15/2016  .   Total Time in preparing paper work, data evaluation and todays exam - 35 minutes  Thurnell Lose M.D on 01/23/2016 at 10:35 AM  [facility]   Office  [phone]

## 2016-01-23 NOTE — Care Management Note (Signed)
Case Management Note Marvetta Gibbons RN, BSN Unit 2W-Case Manager [phone]  Patient Details  Name: [name] [name] MRN: [id number] Date of Birth: Dec 24, 1960  Subjective/Objective:       Pt admitted with SIRS and recurrent empyema with chest tube placement             Action/Plan:  PTA pt lived at home- has had poor f/u with outpt appointment- PCP- [facility]- f/u appointment has been made for 01/31/16 at 5:30- pt will need IV abx at discharge- has had [facility] in past- plan is to use them again for [facility] services- referral has been made to [facility] who is following for IV abx needs. Orders have also been placed for HH-RN/PT/Resp/SW- spoke with Colletta [place] with [facility] regarding other [place] needs and pt d/c for today. Pt's brother to assist at home.   Expected Discharge Date:       01/23/16           Expected Discharge Plan:  [place]  In-House Referral:     Discharge planning Services  CM Consult, Follow-up appt scheduled  Post Acute Care Choice:  Home Health Choice offered to:  Patient  DME Arranged:    DME Agency:     HH Arranged:  IV Antibiotics, RN, PT, Respirator Therapy, Social Work [employer] Agency:  [place]  Status of Service:  Completed, signed off  Medicare Important Message Given:    Date Medicare IM Given:    Medicare IM give by:    Date Additional Medicare IM Given:    Additional Medicare Important Message give by:     If discussed at [place] of Stay Meetings, dates discussed:  01/16/16, 01/18/16, 01/23/16  Discharge Disposition: home/home health   Additional Comments:  Dawayne Patricia, RN 01/23/2016, 12:17 PM

## 2016-01-23 NOTE — Progress Notes (Addendum)
      [address]       [place] [address]             [phone]      5 Days Post-Op Procedure(s) (LRB): Right Heart Cath (N/A)   Subjective:  [name] [name] has no new complaints.  He continues to have productive cough.  States Mucinex helped some.  Objective: Vital signs in last 24 hours: Temp:  [98.3 F (36.8 C)-98.5 F (36.9 C)] 98.3 F (36.8 C) (03/21 0600) Pulse Rate:  [84-87] 87 (03/21 0600) Cardiac Rhythm:  [-] Normal sinus rhythm (03/20 2004) Resp:  [18] 18 (03/21 0600) BP: (110-125)/(74-81) 125/81 mmHg (03/21 0600) SpO2:  [96 %-97 %] 96 % (03/21 0600)   Intake/Output from previous day: 03/20 0701 - 03/21 0700 In: 660 [P.O.:360; IV Piggyback:300] Out: 31 [Stool:1; Chest Tube:30]  General appearance: alert, cooperative and no distress Heart: regular rate and rhythm Lungs: diminished breath sounds bibasilar Wound: clean and dry  Lab Results:  Recent Labs  01/22/16 0424  WBC 7.6  HGB 10.6*  HCT 35.0*  PLT 376   BMET:  Recent Labs  01/22/16 0424  NA 139  K 3.8  CL 101  CO2 29  GLUCOSE 90  BUN 11  CREATININE 0.94  CALCIUM 8.4*    PT/INR: No results for input(s): LABPROT, INR in the last 72 hours. ABG    Component Value Date/Time   PHART 7.336* 01/18/2016 0934   HCO3 41.3* 01/18/2016 0939   TCO2 44 01/18/2016 0939   O2SAT 68.0 01/18/2016 0939   CBG (last 3)  No results for input(s): GLUCAP in the last 72 hours.  Assessment/Plan: S/P Procedure(s) (LRB): Right Heart Cath (N/A)  1. Chest tube removed yesterday- CXR ordered for 6AM however, not yet completed 2. ID- empyema on ABX per sensitivities 3. CV- PAH 4. Dispo- care per primary, will review CXR once completed   LOS: 12 days    BARRETT, ERIN 01/23/2016  Dg Chest 2 View  01/23/2016  CLINICAL DATA:  Chest tube removal. EXAM: CHEST  2 VIEW COMPARISON:  01/22/2016 . FINDINGS: Interval removal of left chest tube. Right PICC line stable position. Improvement of  loculated left base pneumothorax. Stable left pleural thickening. No progressive pleural effusion. Small right pleural effusion. Calcified pleural plaques again noted. Heart size stable. No acute bony abnormality P IMPRESSION: 1. Interim removal of left chest tube. Small left base hydro pneumothorax has improved. Stable left pleural thickening noted. 2. Right PICC line stable position. 3. Calcified pleural plaques again noted. Small right pleural effusion again noted . Electronically Signed   By: Marcello Moores  Register   On: 01/23/2016 08:55   Chest xray improved  Home today I have seen and examined [name] [name] and agree with the above assessment  and plan.  [name] MD Beeper [phone] Office [phone] 01/23/2016 11:37 AM

## 2016-01-30 ENCOUNTER — Inpatient Hospital Stay: Payer: BLUE CROSS/BLUE SHIELD | Admitting: Adult Health

## 2016-01-31 ENCOUNTER — Other Ambulatory Visit: Payer: Self-pay | Admitting: Cardiothoracic Surgery

## 2016-01-31 ENCOUNTER — Inpatient Hospital Stay: Payer: BLUE CROSS/BLUE SHIELD | Admitting: Internal Medicine

## 2016-01-31 DIAGNOSIS — J90 Pleural effusion, not elsewhere classified: Secondary | ICD-10-CM

## 2016-02-01 ENCOUNTER — Encounter: Payer: Self-pay | Admitting: Cardiothoracic Surgery

## 2016-02-01 ENCOUNTER — Ambulatory Visit
Admission: RE | Admit: 2016-02-01 | Discharge: 2016-02-01 | Disposition: A | Discharge status: Home / Self Care | Payer: BLUE CROSS/BLUE SHIELD | Source: Ambulatory Visit | Attending: Cardiothoracic Surgery | Admitting: Cardiothoracic Surgery

## 2016-02-01 ENCOUNTER — Ambulatory Visit (INDEPENDENT_AMBULATORY_CARE_PROVIDER_SITE_OTHER): Payer: BLUE CROSS/BLUE SHIELD | Admitting: Cardiothoracic Surgery

## 2016-02-01 VITALS — BP 129/95 | HR 96 | Resp 20 | Ht 60.0 in | Wt 99.0 lb

## 2016-02-01 DIAGNOSIS — I272 Other secondary pulmonary hypertension: Secondary | ICD-10-CM | POA: Diagnosis not present

## 2016-02-01 DIAGNOSIS — J869 Pyothorax without fistula: Secondary | ICD-10-CM

## 2016-02-01 DIAGNOSIS — R6510 Systemic inflammatory response syndrome (SIRS) of non-infectious origin without acute organ dysfunction: Secondary | ICD-10-CM

## 2016-02-01 DIAGNOSIS — J948 Other specified pleural conditions: Secondary | ICD-10-CM | POA: Diagnosis not present

## 2016-02-01 DIAGNOSIS — J90 Pleural effusion, not elsewhere classified: Secondary | ICD-10-CM

## 2016-02-01 NOTE — Progress Notes (Signed)
[address]       [place] [address]             [phone]      [name] [name] Medical Record [id number] Date of Birth: April 30, 1961  Referring: Lacretia Leigh, MD Primary Care: No primary care provider on file.  Chief Complaint:   [facility]  FOLLOW UP  History of Present Illness:     Patient returns to the office stay after recent hospitalization for hypoxia and recurrent left empyema, he continues on IV vancomycin at home. He denies any fever or chills. He does have some cough. Overall he says he feels much better than when he was admitted from the office several weeks ago with O2 sat of 83.  Past Surgical History  Procedure Laterality Date  . Kidney surgery    . Video assisted thoracoscopy (vats)/decortication Left 2003  . Video assisted thoracoscopy (vats)/decortication Left 10/02/2015    Procedure: VIDEO ASSISTED THORACOSCOPY (VATS)/DECORTICATION and drainage of chronic empyena;  Surgeon: [name], MD;  Location: [facility];  Service: Thoracic;  Laterality: Left;  . Video bronchoscopy N/A 10/02/2015    Procedure: VIDEO BRONCHOSCOPY;  Surgeon: [name], MD;  Location: [facility];  Service: Thoracic;  Laterality: N/A;  . Cardiac catheterization N/A 01/18/2016    Procedure: Right Heart Cath;  Surgeon: Jolaine Artist, MD;  Location: [facility];  Service: Cardiovascular;  Laterality: N/A;     Past Medical History  Diagnosis Date  . Kidney stones   . Hypertension      History  Smoking status  . Never Smoker   Smokeless tobacco  . Not on file    History  Alcohol Use  . Yes     No Known Allergies  Current Outpatient Prescriptions  Medication Sig Dispense Refill  . guaiFENesin (MUCINEX) 600 MG 12 hr tablet Take 1 tablet (600 mg total) by mouth 2 (two) times daily. 20 tablet 0  . Vancomycin (VANCOCIN) 750 MG/150ML SOLN Inject 150 mLs (750 mg total) into the vein every 12 (twelve) hours. Please provide supplies  lasting for 02/06/2016 which is to stop date. Home health RN to draw CBC, CMP, Vanco trough levels faxed to the infectious disease clinic when discharged. 4000 mL 20   No current facility-administered medications for this visit.       Physical Exam: BP 129/95 mmHg  Pulse 96  Resp 20  Ht 5' (1.524 m)  Wt 99 lb (44.906 kg)  BMI 19.33 kg/m2  SpO2 95%  General appearance: alert and cooperative Neurologic: intact Heart: regular rate and rhythm, S1, S2 normal, no murmur, click, rub or gallop Lungs: diminished breath sounds LLL Abdomen: soft, non-tender; bowel sounds normal; no masses,  no organomegaly Extremities: extremities normal, atraumatic, no cyanosis or edema and Homans sign is negative, no sign of DVT Wound: All chest tube sites are well-healed   Diagnostic Studies & Laboratory data:     Recent Radiology Findings:   Dg Chest 2 View  02/01/2016  CLINICAL DATA:  55 year old male with shortness of breath, productive cough. Symptoms since last week. Initial encounter. Status post decortication of empyema in November 2016. EXAM: CHEST  2 VIEW COMPARISON:  01/23/2016 and earlier. FINDINGS: Stable right PICC line. Stable lung volumes and ventilation. Chronic pleural distortion on the left with bilateral calcified pleural plaques re- demonstrated. Residual left lateral loculated hydro pneumothorax is stable. No superimposed pulmonary edema, new pleural effusion, or new pulmonary  opacity. Stable cardiac size and mediastinal contours. Stable visualized osseous structures. IMPRESSION: 1. Residual left lateral loculated hydro pneumothorax is stable since last week. 2. Stable chronic lung and pleural disease otherwise. 3.  No superimposed acute findings are identified. Electronically Signed   By: Genevie Ann M.D.   On: 02/01/2016 08:35      Recent Lab Findings: Lab Results  Component Value Date   WBC 7.6 01/22/2016   HGB 10.6* 01/22/2016   HCT 35.0* 01/22/2016   PLT 376 01/22/2016   GLUCOSE  90 01/22/2016   ALT 14* 01/15/2016   AST 24 01/15/2016   NA 139 01/22/2016   K 3.8 01/22/2016   CL 101 01/22/2016   CREATININE 0.94 01/22/2016   BUN 11 01/22/2016   CO2 29 01/22/2016   INR 1.12 01/11/2016      Assessment / Plan:      Patient seems improved from his previous visit, continues on home antibiotics as monitored by infectious disease clinic. We'll plan to see the patient back in 2 weeks with a follow-up chest x-ray. No current change to the treatment plan. We have reminded him and showed him where the infectious disease office is for his appointment on April 4.     [name] MD      [address] [place] [address] Office [phone]   Beeper [phone]  02/01/2016 9:37 AM

## 2016-02-05 ENCOUNTER — Telehealth: Payer: Self-pay | Admitting: *Deleted

## 2016-02-05 NOTE — Telephone Encounter (Addendum)
[facility] labs results from Saturday, 02/03/16, found at [facility] Monday, 02/05/16, Critical High potassium = 6.7.  RN called [facility] to find out what had been done about the potassium level.  RN spoke with Jeani Hawking, [facility] pharmacist.  Jeani Hawking stated that this was found this morning as well in the pharmacy.  Jeani Hawking, [facility] pharmacist stated that the lab would be redrawn today.  Pharmacy to research what was done about this result and return call.

## 2016-02-06 ENCOUNTER — Encounter: Payer: Self-pay | Admitting: Infectious Diseases

## 2016-02-06 ENCOUNTER — Ambulatory Visit (HOSPITAL_COMMUNITY)
Admission: RE | Admit: 2016-02-06 | Discharge: 2016-02-06 | Disposition: A | Discharge status: Home / Self Care | Payer: BLUE CROSS/BLUE SHIELD | Source: Ambulatory Visit | Attending: Internal Medicine | Admitting: Internal Medicine

## 2016-02-06 ENCOUNTER — Ambulatory Visit (INDEPENDENT_AMBULATORY_CARE_PROVIDER_SITE_OTHER): Payer: BLUE CROSS/BLUE SHIELD | Admitting: Infectious Diseases

## 2016-02-06 ENCOUNTER — Telehealth (HOSPITAL_COMMUNITY): Payer: Self-pay | Admitting: Vascular Surgery

## 2016-02-06 VITALS — BP 124/87 | HR 92 | Temp 97.1°F | Wt 88.0 lb

## 2016-02-06 VITALS — BP 92/64 | HR 92 | Wt 89.0 lb

## 2016-02-06 DIAGNOSIS — J90 Pleural effusion, not elsewhere classified: Secondary | ICD-10-CM | POA: Insufficient documentation

## 2016-02-06 DIAGNOSIS — J869 Pyothorax without fistula: Secondary | ICD-10-CM | POA: Diagnosis not present

## 2016-02-06 DIAGNOSIS — I272 Other secondary pulmonary hypertension: Secondary | ICD-10-CM | POA: Insufficient documentation

## 2016-02-06 MED ORDER — SILDENAFIL CITRATE 20 MG PO TABS
20.0000 mg | ORAL_TABLET | Freq: Three times a day (TID) | ORAL | Status: DC
Start: 2016-02-06 — End: 2016-02-15

## 2016-02-06 NOTE — Progress Notes (Signed)
ADVANCED HF CLINIC NOTE  Patient ID: [name] [name], male   DOB: May 25, 1961, 55 y.o.   MRN: [id number] PCP: Primary Cardiologist:  HPI:  [name] [name] is a 55 y.o. [place] male with PMH of urolithiasis, HTN, chronic empyema s/p remote lung decortication and PAH.  He was admitted to [facility] in 09/2015 for empyema, underwent a VATs procedure and chest tube placement and PICC line placement for outpatient IV ABX. He was non compliant with Cardiothoracic surgery and other provider visits and continued to go to the ED for care, where his chest tube was eventually removed in 10/2015. He was readmitted in 3/16 with hemoptysis and recurrent empyema. Chest tube placed. Culture + methicillin resistant CN Staph. Echo with evidence of PAH.  Underwent RHC on 01/18/16 with mild to moderate PAH felt to be due to hypoxic lung disease.  RA = 4 RV = 54/3/7 PA = 59/24 (37) PCW = 9 Fick cardiac output/index = 4.1/3.2 PVR = 6.8 WU FA sat = 95% PA sat = 68%, 68%  He was discharged home with IV vancomycin through a PICC line expected to complete 02/06/16. Prior to d/c was able to ambulate in hall off O2 with O2 sats in mid 90s.     He presents today for post hospital follow up. Finishes IV vanc tonight. Denies fever Breathing stable, continues to have scant hemoptysis. SOB walking from check-in to patient room, but not marked. Does also have some SOB with ADLs such as showering or changing clothes. Denies lightheadedness or dizziness. Has non-specific chest pain still, not related to activity or relieved by rest, constant, dull ache. Denies peripheral edema. He does have some orthopnea and bendopnea. He has not been working.   Echo 01/14/16 LVEF 0000000, septal diastolic and systolic flattening. Trivial MR, Mildly dilated RV with mild/mod reduced RV function. Mild TR, PA peak pressure 81 mm Hg.   ROS: All systems negative except as listed in HPI, PMH and Problem List.  SH:  Social History   Social History  . Marital  Status: Single    Spouse Name: N/A  . Number of Children: N/A  . Years of Education: N/A   Occupational History  . Not on file.   Social History Main Topics  . Smoking status: Never Smoker   . Smokeless tobacco: Not on file  . Alcohol Use: Yes  . Drug Use: No  . Sexual Activity: Not on file   Other Topics Concern  . Not on file   Social History Narrative    FH: No family history on file.  Past Medical History  Diagnosis Date  . Kidney stones   . Hypertension     Current Outpatient Prescriptions  Medication Sig Dispense Refill  . guaiFENesin (MUCINEX) 600 MG 12 hr tablet Take 1 tablet (600 mg total) by mouth 2 (two) times daily. 20 tablet 0  . Vancomycin (VANCOCIN) 750 MG/150ML SOLN Inject 150 mLs (750 mg total) into the vein every 12 (twelve) hours. Please provide supplies lasting for 02/06/2016 which is to stop date. Home health RN to draw CBC, CMP, Vanco trough levels faxed to the infectious disease clinic when discharged. 4000 mL 20   No current facility-administered medications for this encounter.    Filed Vitals:   02/06/16 1027  BP: 92/64  Pulse: 92  Weight: 89 lb (40.37 kg)  SpO2: 96%    PHYSICAL EXAM:  General:  Well appearing. No resp difficulty HEENT: normal Neck: supple. JVP 6-7. Carotids 2+ bilaterally; no bruits. No  lymphadenopathy or thyromegaly appreciated. Cor: PMI normal. RR. No M/G/R. Normal S1, Prominent S2, minimal TR Lungs: Diminished L base, R sided crackles. Abdomen: soft, NT, ND, no HSM. No bruits or masses. +BS  Extremities: no cyanosis, rash, or edema. + Clubbing Neuro: alert & orientedx3, cranial nerves grossly intact. Moves all 4 extremities w/o difficulty. Affect pleasant.  ASSESSMENT & PLAN: 1. Pulmonary HTN with cor pulmonale - PA pressure 81 mm HG by echo. - ANA negative, ANCA negative, RF negative. Recent HIV/HCV test negative - VQ scan low probability. - LE dopplers negative for DVT.  - RHC on 01/18/16 with mild to  moderate PAH felt to be due to hypoxic lung disease.  - Will get CXR today. - Start sildenafil 20 mg TID - Will get PFTs with DLCO - 14mw next visit. 2. Recurrent left-sided empyema with sepsis - Per primary and Dr Servando Snare. - To finish IV vanc today.  3. Hyperkalemia - In hospital, resolved. Recheck BMET  Follow up 1 month  [name] [name], [place] 02/06/2016 11:30 AM   Patient seen and examined with [name] Kilts, PA-C. We discussed all aspects of the encounter. I agree with the assessment and plan as stated above.   He is improved but still NYHA III with evidence of effusion on left. Will repeat CXR. Given lack of hypoxemia on exertion likely hs component of Group I PAH. Will start sildenafil. Needs PFTs with DLCO and 6MW. Will finish abx today.   [name] Wachter,MD 6:02 PM

## 2016-02-06 NOTE — Telephone Encounter (Signed)
Can not get in touch with pt, sent pt letter with pft time , date and instructions

## 2016-02-06 NOTE — Progress Notes (Signed)
   Subjective:    Patient ID: [name] [name], male    DOB: 21-Apr-1961, 55 y.o.   MRN: [id number]  HPI 55 y.o. Male with a hx of right heart failure/Cor pulmonale, long history of chronic left pleural effusion. He underwent thoracotomy and decortication in 2003. He had persistent left effusion. He was treated for community-acquired pneumonia in 2012. He was readmitted last November with worsening shortness of breath. He underwent VATS drainage of what appeared to be a chronic empyema followed by decortication again on 10/02/2015. Pleural fluid cultures grew multiple organisms. 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 He had a sample sent to [facility] for molecular testing- this was negative for bacterial DNA. He had a chest tube placed on 10/07/2015 and was discharged on IV piperacillin tazobactam. He completed 3 weeks of therapy on 11/07/2015. His chest tube had been removed. He appeared to be better after completing antibiotics but recently has developed increased shortness of breath, hypoxia and fever. He was readmitted 01-11-16 with temperature of 102.6.  He underwent repeat chest tube. He had a single Cx which grew CNS. He was started on vanco He did well, his CT was removed on 3-20 and was able to be d/c home on 3-21.  vanco end date 02-06-16.RN will remove [facility] tomorrow.  Still feels like he has sputum stuck in his throat.   Was seen in CHF clinic today- started on sildenafil.  No further fevers. Feels some tightness in his L lower chest with deep breathing.  Feels like his SOB is unchanged. Has had occas episodes of hemoptysis (up to a Tbs), usually dry cough.  Has had quantiferon gold negative x 2.  He had repeat CXR 02-06-16: 1. Loculated left pleural effusion posteriorly. 2. Suspect small loculated left hydro pneumothorax.  Review of Systems  Constitutional: Negative for fever and chills.  Respiratory: Positive for cough and shortness of breath.   Cardiovascular:  Positive for chest pain.  Gastrointestinal: Negative for diarrhea and constipation.  Genitourinary: Negative for difficulty urinating.       Objective:   Physical Exam  Constitutional: He appears well-developed and well-nourished. No distress.  HENT:  Mouth/Throat: No oropharyngeal exudate.  Eyes: EOM are normal. Pupils are equal, round, and reactive to light.  Neck: Neck supple.  Cardiovascular: Normal rate, regular rhythm and normal heart sounds.   Pulmonary/Chest: Effort normal. He has decreased breath sounds in the left lower field.  Abdominal: Soft. Bowel sounds are normal. There is no tenderness. There is no rebound.  Musculoskeletal:       Arms: Lymphadenopathy:    He has no cervical adenopathy.       Assessment & Plan:

## 2016-02-06 NOTE — Assessment & Plan Note (Addendum)
He will complete his vanco tonight. PIC pulled by RN tomorrow.  Will review his labs from home health.  The etiology of this remains unclear. If he has further thoracentesis or chest tube, would send his specimen to [facility] again for molecular testing for bacterial, fungal and mycobacterial DNA.  I will see him back in 3 months.

## 2016-02-06 NOTE — Patient Instructions (Signed)
Start Sildenafil 20 mg Three times a day   Chest x-ray today  Your physician has recommended that you have a pulmonary function test. Pulmonary Function Tests are a group of tests that measure how well air moves in and out of your lungs.  Your physician recommends that you schedule a follow-up appointment in: 1 month

## 2016-02-07 ENCOUNTER — Telehealth: Payer: Self-pay | Admitting: *Deleted

## 2016-02-07 NOTE — Telephone Encounter (Signed)
Call from [place] at [facility]. Patient is requesting nursing to come to his home and pull the PICC today.  Per Dr. Algis Downs notes, patient completed IV antibiotics 4/4 and should have PICC pulled 4/5.  RN relayed the verbal order. Landis Gandy, RN

## 2016-02-08 ENCOUNTER — Other Ambulatory Visit: Payer: Self-pay | Admitting: *Deleted

## 2016-02-08 DIAGNOSIS — J869 Pyothorax without fistula: Secondary | ICD-10-CM

## 2016-02-13 ENCOUNTER — Encounter: Payer: Self-pay | Admitting: Internal Medicine

## 2016-02-15 ENCOUNTER — Encounter: Payer: Self-pay | Admitting: Cardiothoracic Surgery

## 2016-02-15 ENCOUNTER — Ambulatory Visit (INDEPENDENT_AMBULATORY_CARE_PROVIDER_SITE_OTHER): Payer: BLUE CROSS/BLUE SHIELD | Admitting: Cardiothoracic Surgery

## 2016-02-15 ENCOUNTER — Ambulatory Visit
Admission: RE | Admit: 2016-02-15 | Discharge: 2016-02-15 | Disposition: A | Discharge status: Home / Self Care | Payer: BLUE CROSS/BLUE SHIELD | Source: Ambulatory Visit | Attending: Cardiothoracic Surgery | Admitting: Cardiothoracic Surgery

## 2016-02-15 VITALS — BP 119/81 | HR 84 | Resp 16 | Ht 60.0 in | Wt 89.0 lb

## 2016-02-15 DIAGNOSIS — J869 Pyothorax without fistula: Secondary | ICD-10-CM | POA: Diagnosis not present

## 2016-02-15 DIAGNOSIS — Z09 Encounter for follow-up examination after completed treatment for conditions other than malignant neoplasm: Secondary | ICD-10-CM

## 2016-02-15 NOTE — Progress Notes (Signed)
[address]       [place] [address]             [phone]      [name] [name] Medical Record [id number] Date of Birth: 1961/08/02  Referring: Lacretia Leigh, MD Primary Care: No primary care provider on file.  Chief Complaint:   [facility]  FOLLOW UP  History of Present Illness:     Patient returns to the office stay after recent hospitalization for hypoxia and recurrent left empyema, he stopped IV vancomycin at home about 10 days ago. He denies any fever or chills. His cough has improved. Overall he says he feels much better than when he was admitted from the office 6 weeks ago with O2 sat of 83.  He has an appointment in the heart failure clinic for evaluation of his enlarged pulmonary arteries and question of pulmonary hypertension.  Past Surgical History  Procedure Laterality Date  . Kidney surgery    . Video assisted thoracoscopy (vats)/decortication Left 2003  . Video assisted thoracoscopy (vats)/decortication Left 10/02/2015    Procedure: VIDEO ASSISTED THORACOSCOPY (VATS)/DECORTICATION and drainage of chronic empyena;  Surgeon: [name], MD;  Location: [facility];  Service: Thoracic;  Laterality: Left;  . Video bronchoscopy N/A 10/02/2015    Procedure: VIDEO BRONCHOSCOPY;  Surgeon: [name], MD;  Location: [facility];  Service: Thoracic;  Laterality: N/A;  . Cardiac catheterization N/A 01/18/2016    Procedure: Right Heart Cath;  Surgeon: Jolaine Artist, MD;  Location: [facility];  Service: Cardiovascular;  Laterality: N/A;     Past Medical History  Diagnosis Date  . Kidney stones   . Hypertension      History  Smoking status  . Never Smoker   Smokeless tobacco  . Not on file    History  Alcohol Use  . Yes     No Known Allergies  No current outpatient prescriptions on file.   No current facility-administered medications for this visit.       Physical Exam: BP 119/81 mmHg  Pulse 84  Resp 16   Ht 5' (1.524 m)  Wt 89 lb (40.37 kg)  BMI 17.38 kg/m2  SpO2 95%  General appearance: alert and cooperative, overall the patient looks much better, breathing easily, is not dyspneic walking around the office Neurologic: intact Heart: regular rate and rhythm, S1, S2 normal, no murmur, click, rub or gallop Lungs: diminished breath sounds LLL Abdomen: soft, non-tender; bowel sounds normal; no masses,  no organomegaly Extremities: extremities normal, atraumatic, no cyanosis or edema and Homans sign is negative, no sign of DVT Wound: All chest tube sites are well-healed   Diagnostic Studies & Laboratory data:     Recent Radiology Findings:   Dg Chest 2 View  02/15/2016  CLINICAL DATA:  Status post prior VATS and chest tube placement for recurrent left empyema. EXAM: CHEST - 2 VIEW COMPARISON:  02/06/2016 FINDINGS: Stable appearance to residual loculated left-sided hydro pneumothorax and basilar loculated pleural fluid. Stable chronic changes in the lung parenchyma bilaterally. The visualized skeletal structures are unremarkable. IMPRESSION: Stable appearance of loculated left-sided hydro pneumothorax and pleural fluid. The presence of air in the pleural space may be secondary to residual empyema but does raise the possibility of bronchopleural fistula. Electronically Signed   By: Aletta Edouard M.D.   On: 02/15/2016 08:29      Recent Lab Findings: Lab Results  Component Value Date   WBC  7.6 01/22/2016   HGB 10.6* 01/22/2016   HCT 35.0* 01/22/2016   PLT 376 01/22/2016   GLUCOSE 90 01/22/2016   ALT 14* 01/15/2016   AST 24 01/15/2016   NA 139 01/22/2016   K 3.8 01/22/2016   CL 101 01/22/2016   CREATININE 0.94 01/22/2016   BUN 11 01/22/2016   CO2 29 01/22/2016   INR 1.12 01/11/2016      Assessment / Plan:      Patient seems improved from his previous visit,  home antibiotics Have been discontinued We'll plan to see the patient back in 4 weeks with a follow-up chest x-ray. No  current change to the treatment plan. 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     [name] MD      [address] [place] [address] Office [phone]   Beeper [phone]  02/15/2016 9:30 AM

## 2016-02-16 ENCOUNTER — Ambulatory Visit (HOSPITAL_COMMUNITY)
Admission: RE | Admit: 2016-02-16 | Discharge: 2016-02-16 | Disposition: A | Discharge status: Home / Self Care | Payer: BLUE CROSS/BLUE SHIELD | Source: Ambulatory Visit | Attending: Internal Medicine | Admitting: Internal Medicine

## 2016-02-16 DIAGNOSIS — I272 Other secondary pulmonary hypertension: Secondary | ICD-10-CM | POA: Insufficient documentation

## 2016-02-16 LAB — PULMONARY FUNCTION TEST
DL/VA % pred: 94 %
DL/VA: 3.57 ml/min/mmHg/L
DLCO unc % pred: 54 %
DLCO unc: 10.3 ml/min/mmHg
FEF 25-75 Post: 0.15 L/sec
FEF 25-75 Pre: 0.27 L/sec
FEF2575-%CHANGE-POST: -43 %
FEF2575-%Pred-Post: 6 %
FEF2575-%Pred-Pre: 11 %
FEV1-%Change-Post: -24 %
FEV1-%PRED-POST: 21 %
FEV1-%PRED-PRE: 28 %
FEV1-POST: 0.56 L
FEV1-PRE: 0.74 L
FEV1FVC-%CHANGE-POST: -2 %
FEV1FVC-%Pred-Pre: 59 %
FEV6-%CHANGE-POST: -14 %
FEV6-%PRED-PRE: 44 %
FEV6-%Pred-Post: 38 %
FEV6-POST: 1.23 L
FEV6-Pre: 1.44 L
FEV6FVC-%Change-Post: 11 %
FEV6FVC-%PRED-POST: 103 %
FEV6FVC-%PRED-PRE: 92 %
FVC-%CHANGE-POST: -23 %
FVC-%PRED-PRE: 48 %
FVC-%Pred-Post: 37 %
FVC-POST: 1.25 L
FVC-PRE: 1.63 L
POST FEV6/FVC RATIO: 98 %
Post FEV1/FVC ratio: 45 %
Pre FEV1/FVC ratio: 46 %
Pre FEV6/FVC Ratio: 88 %
RV % PRED: 10 %
RV: 0.16 L
TLC % pred: 58 %
TLC: 2.88 L

## 2016-02-16 MED ORDER — ALBUTEROL SULFATE (2.5 MG/3ML) 0.083% IN NEBU
2.5000 mg | INHALATION_SOLUTION | Freq: Once | RESPIRATORY_TRACT | Status: AC
  Administered 2016-02-16: 2.5 mg via RESPIRATORY_TRACT

## 2016-03-08 ENCOUNTER — Encounter: Payer: Self-pay | Admitting: Internal Medicine

## 2016-03-11 ENCOUNTER — Ambulatory Visit (HOSPITAL_COMMUNITY)
Admission: RE | Admit: 2016-03-11 | Discharge: 2016-03-11 | Disposition: A | Discharge status: Home / Self Care | Payer: BLUE CROSS/BLUE SHIELD | Source: Ambulatory Visit | Attending: Internal Medicine | Admitting: Internal Medicine

## 2016-03-11 VITALS — BP 104/50 | HR 87 | Wt 92.5 lb

## 2016-03-11 DIAGNOSIS — J984 Other disorders of lung: Secondary | ICD-10-CM | POA: Diagnosis not present

## 2016-03-11 DIAGNOSIS — Z79899 Other long term (current) drug therapy: Secondary | ICD-10-CM | POA: Diagnosis not present

## 2016-03-11 DIAGNOSIS — J42 Unspecified chronic bronchitis: Secondary | ICD-10-CM | POA: Diagnosis not present

## 2016-03-11 DIAGNOSIS — J449 Chronic obstructive pulmonary disease, unspecified: Secondary | ICD-10-CM | POA: Insufficient documentation

## 2016-03-11 DIAGNOSIS — I272 Other secondary pulmonary hypertension: Secondary | ICD-10-CM | POA: Insufficient documentation

## 2016-03-11 DIAGNOSIS — I10 Essential (primary) hypertension: Secondary | ICD-10-CM | POA: Insufficient documentation

## 2016-03-11 NOTE — Progress Notes (Signed)
Patient ID: [name] [name], male   DOB: [birth year], 55 y.o.   MRN: [id number]  ADVANCED HF CLINIC NOTE  Patient ID: [name] [name], male   DOB: [birth year], 55 y.o.   MRN: [id number] PCP: Primary Cardiologist:  HPI:  [name] [name] is a 55 y.o. [place] male with PMH of urolithiasis, HTN, chronic empyema s/p remote lung decortication and PAH.  He was admitted to [facility] in 09/2015 for empyema, underwent a VATs procedure and chest tube placement and PICC line placement for outpatient IV ABX. He was non compliant with Cardiothoracic surgery and other provider visits and continued to go to the ED for care, where his chest tube was eventually removed in 10/2015. He was readmitted in 3/16 with hemoptysis and recurrent empyema. Chest tube placed. Culture + methicillin resistant CN Staph. Echo with evidence of PAH.  Underwent RHC on 01/18/16 with mild to moderate PAH felt to be due to hypoxic lung disease.  RA = 4 RV = 54/3/7 PA = 59/24 (37) PCW = 9 Fick cardiac output/index = 4.1/3.2 PVR = 6.8 WU FA sat = 95% PA sat = 68%, 68%  He was discharged home with IV vancomycin through a PICC line expected to complete 02/06/16. Prior to d/c was able to ambulate in hall off O2 with O2 sats in mid 90s.     PFTs 02/16/16 FVC 1.63 (48%) FEV1 0.75 (28%) FEV1/FVC ratio 46% TLC 2.88 (58%) DLCO 10.3 (54%)  He presents today for regular follow up. Since last visit has finished ABX and PICC removed. Up 4 lbs since last visit. Says his breathing has improved. No longer SOB with showering or changing clothes. No lightheadedness or dizziness. Is having occasional epigastric "tightness" that is associated with meals. No edema, orthopnea, or bendopnea. Still out of work. Denies fevers or chills. Continues to have scant coughing that has improved.    Echo 01/14/16 LVEF 0000000, septal diastolic and systolic flattening. Trivial MR, Mildly dilated RV with mild/mod reduced RV function. Mild TR, PA peak pressure 81 mm Hg.   ROS: All systems  negative except as listed in HPI, PMH and Problem List.  SH:  Social History   Social History  . Marital Status: Single    Spouse Name: N/A  . Number of Children: N/A  . Years of Education: N/A   Occupational History  . Not on file.   Social History Main Topics  . Smoking status: Never Smoker   . Smokeless tobacco: Not on file  . Alcohol Use: Yes  . Drug Use: No  . Sexual Activity: Not on file   Other Topics Concern  . Not on file   Social History Narrative    FH: No family history on file.  Past Medical History  Diagnosis Date  . Kidney stones   . Hypertension     No current outpatient prescriptions on file.   No current facility-administered medications for this encounter.    Filed Vitals:   03/11/16 1336  BP: 104/50  Pulse: 87  Weight: 92 lb 8 oz (41.958 kg)  SpO2: 94%   Wt Readings from Last 3 Encounters:  03/11/16 92 lb 8 oz (41.958 kg)  02/15/16 89 lb (40.37 kg)  02/06/16 88 lb (39.917 kg)     PHYSICAL EXAM:  General:  Well appearing. No resp difficulty, very thin HEENT: normal Neck: supple. JVP 6-7. Carotids 2+ bilaterally; no bruits. No thyromegaly or nodule noted.  Cor: PMI normal. RRR. No M/G/R. Normal S1, Prominent S2, minimal TR Lungs: Left  side diminished compared to right, mild dry basilar crackles bilaterally. .  Abdomen: soft, non-tender, non-distended, no HSM. No bruits or masses. +BS  Extremities: no cyanosis, rash, or edema. + Clubbing Neuro: alert & orientedx3, cranial nerves grossly intact. Moves all 4 extremities w/o difficulty. Affect pleasant.  ASSESSMENT & PLAN: 1. Pulmonary HTN with cor pulmonale - Think primarily Group 3 with likely component of Group 1 with improvement on sildenafil.  - PFTs with DLCO 54%, bad obstructive disease.  - PA pressure 81 mm HG by echo. - ANA negative, ANCA negative, RF negative. Recent HIV/HCV test negative - VQ scan low probability. - LE dopplers negative for DVT.  - RHC on 01/18/16 with  mild to moderate PAH felt to be due to hypoxic lung disease.  - Continue sildenafil 20 mg TID 2. Recurrent left-sided empyema s/p drainage - Followed by Dr Servando Snare. - Finished course of IV vanc 02/06/16  - CXR 02/15/16 stable.   3. COPD - Severe by PFTs 02/16/16 FVC 1.63 (48%), FEV1 0.75 (28%), FEV1/FVC ratio 46%, TLC 2.88 (58%), DLCO 10.3 (54%) - Will refer to pulmonary.    Follow up 2  Months with Echo.  Stable on current regimen as above. Refer to pulm  [employer]" [place], [place] 03/11/2016 1:41 PM   Patient seen and examined with Oda Kilts, PA-C. We discussed all aspects of the encounter. I agree with the assessment and plan as stated above.   Feeling much better. Tolerating sildenafil 20 tid. PFTs reviewed with him. He has severe lung disease. Will refer to Pulmonary. Suspect PAH primarily WHO Group 3 with perhaps a component of WHO Group 1 disease. Will continue current regimen. See back in 2 months with repeat echo.   Bensimhon, Daniel,MD 4:40 PM

## 2016-03-11 NOTE — Patient Instructions (Signed)
Your provider has referred you to Pulmonary.  Follow up and ECHO with Dr.Bensimhon in 2 months.

## 2016-03-13 ENCOUNTER — Other Ambulatory Visit: Payer: Self-pay | Admitting: Cardiothoracic Surgery

## 2016-03-13 DIAGNOSIS — J869 Pyothorax without fistula: Secondary | ICD-10-CM

## 2016-03-14 ENCOUNTER — Ambulatory Visit (INDEPENDENT_AMBULATORY_CARE_PROVIDER_SITE_OTHER): Payer: BLUE CROSS/BLUE SHIELD | Admitting: Cardiothoracic Surgery

## 2016-03-14 ENCOUNTER — Encounter: Payer: Self-pay | Admitting: Cardiothoracic Surgery

## 2016-03-14 ENCOUNTER — Ambulatory Visit
Admission: RE | Admit: 2016-03-14 | Discharge: 2016-03-14 | Disposition: A | Discharge status: Home / Self Care | Payer: BLUE CROSS/BLUE SHIELD | Source: Ambulatory Visit | Attending: Cardiothoracic Surgery | Admitting: Cardiothoracic Surgery

## 2016-03-14 VITALS — BP 117/79 | HR 76 | Resp 20 | Ht 60.0 in | Wt 92.0 lb

## 2016-03-14 DIAGNOSIS — J869 Pyothorax without fistula: Secondary | ICD-10-CM

## 2016-03-14 DIAGNOSIS — Z09 Encounter for follow-up examination after completed treatment for conditions other than malignant neoplasm: Secondary | ICD-10-CM

## 2016-03-14 DIAGNOSIS — J90 Pleural effusion, not elsewhere classified: Secondary | ICD-10-CM

## 2016-03-14 DIAGNOSIS — J948 Other specified pleural conditions: Secondary | ICD-10-CM

## 2016-03-14 NOTE — Progress Notes (Signed)
[address]       [place] [address]             [phone]      [name] [name] [place] Medical Record [id number] Date of Birth: [birth year]  Referring: Lacretia Leigh, MD Primary Care: No primary care provider on file.  Chief Complaint:   [facility]  FOLLOW UP  History of Present Illness:     Patient returns to the office stay after recent hospitalization for hypoxia and recurrent left empyema,  He denies any fever or chills. His cough has improved. Overall he says he feels much better t  He was  in the heart failure clinic for evaluation of his enlarged pulmonary arteries and question of pulmonary hypertension.  Past Surgical History  Procedure Laterality Date  . Kidney surgery    . Video assisted thoracoscopy (vats)/decortication Left 2003  . Video assisted thoracoscopy (vats)/decortication Left 10/02/2015    Procedure: VIDEO ASSISTED THORACOSCOPY (VATS)/DECORTICATION and drainage of chronic empyena;  Surgeon: [name], MD;  Location: [facility];  Service: Thoracic;  Laterality: Left;  . Video bronchoscopy N/A 10/02/2015    Procedure: VIDEO BRONCHOSCOPY;  Surgeon: [name], MD;  Location: [facility];  Service: Thoracic;  Laterality: N/A;  . Cardiac catheterization N/A 01/18/2016    Procedure: Right Heart Cath;  Surgeon: Jolaine Artist, MD;  Location: [facility];  Service: Cardiovascular;  Laterality: N/A;     Past Medical History  Diagnosis Date  . Kidney stones   . Hypertension      History  Smoking status  . Never Smoker   Smokeless tobacco  . Not on file    History  Alcohol Use  . Yes     No Known Allergies  No current outpatient prescriptions on file.   No current facility-administered medications for this visit.       Physical Exam: BP 117/79 mmHg  Pulse 76  Resp 20  Ht 5' (1.524 m)  Wt 92 lb (41.731 kg)  BMI 17.97 kg/m2  SpO2 96%  General appearance: alert and cooperative, overall the patient  looks much better, breathing easily, is not dyspneic walking around the office Neurologic: intact Heart: regular rate and rhythm, S1, S2 normal, no murmur, click, rub or gallop Lungs: diminished breath sounds LLL Abdomen: soft, non-tender; bowel sounds normal; no masses,  no organomegaly Extremities: extremities normal, atraumatic, no cyanosis or edema and Homans sign is negative, no sign of DVT Wound: All chest tube sites are well-healed   Diagnostic Studies & Laboratory data:     Recent Radiology Findings:   Dg Chest 2 View  03/14/2016  CLINICAL DATA:  Chest pain. EXAM: CHEST  2 VIEW COMPARISON:  February 15, 2016. FINDINGS: Stable cardiomediastinal silhouette. Stable calcified pleural plaque is seen in right lung. Minimal right pleural effusion or basilar scarring is noted. There is continued presence of left loculated hydropneumothorax with no significant change compared to prior exam. Associated atelectasis or scarring is noted in the left lower lung zone. IMPRESSION: Grossly stable appearance of left-sided loculated hydropneumothorax compared to prior exam. Electronically Signed   By: Marijo Conception, M.D.   On: 03/14/2016 09:50      Recent Lab Findings: Lab Results  Component Value Date   WBC 7.6 01/22/2016   HGB 10.6* 01/22/2016   HCT 35.0* 01/22/2016   PLT 376 01/22/2016   GLUCOSE 90 01/22/2016   ALT 14* 01/15/2016   AST 24  01/15/2016   NA 139 01/22/2016   K 3.8 01/22/2016   CL 101 01/22/2016   CREATININE 0.94 01/22/2016   BUN 11 01/22/2016   CO2 29 01/22/2016   INR 1.12 01/11/2016      Assessment / Plan:      Patient seems improved from his previous visit,  home antibiotics Have been discontinued Currently on no meds We'll plan to see the patient back in 3 months with a follow-up chest x-ray. No current change to the treatment plan. Wants to return to work in [place] duty, not lifting over 25 lbs, will call if needs work papers  Evaluated in HF clinic for  pulmonary hypertension, right heart cath done while he was in hospital    [name] MD      [address] [place] [address] Office [phone]   Beeper [phone]  03/14/2016 10:18 AM

## 2016-03-26 ENCOUNTER — Telehealth: Payer: Self-pay | Admitting: Internal Medicine

## 2016-03-26 ENCOUNTER — Ambulatory Visit (INDEPENDENT_AMBULATORY_CARE_PROVIDER_SITE_OTHER): Payer: BLUE CROSS/BLUE SHIELD | Admitting: Internal Medicine

## 2016-03-26 ENCOUNTER — Encounter: Payer: Self-pay | Admitting: Internal Medicine

## 2016-03-26 VITALS — BP 112/78 | HR 94 | Ht 65.0 in | Wt 94.2 lb

## 2016-03-26 DIAGNOSIS — I272 Other secondary pulmonary hypertension: Secondary | ICD-10-CM | POA: Diagnosis not present

## 2016-03-26 DIAGNOSIS — J449 Chronic obstructive pulmonary disease, unspecified: Secondary | ICD-10-CM

## 2016-03-26 DIAGNOSIS — J9611 Chronic respiratory failure with hypoxia: Secondary | ICD-10-CM

## 2016-03-26 MED ORDER — UMECLIDINIUM-VILANTEROL 62.5-25 MCG/INH IN AEPB: 2.0000 | INHALATION_SPRAY | Freq: Once | RESPIRATORY_TRACT | Status: DC

## 2016-03-26 NOTE — Assessment & Plan Note (Signed)
03/26/2016   Walked RA x one lap @ 185 stopped due to  desat to 61% and 90% on 4lpm  - 03/26/2016 rec 4lpm 24/7 > repeat echo in 4 weeks

## 2016-03-26 NOTE — Telephone Encounter (Signed)
Spoke with [place] with [facility].  New order needs to be placed for o2 using new start o2 template.   Order placed.  Jason aware.

## 2016-03-26 NOTE — Patient Instructions (Addendum)
Please see patient coordinator before you leave today  to schedule 02 4lpm 24/7 - wear it as much as you can  Try anoro 1 click each am   Please schedule a follow up office visit in 4 weeks, sooner if needed  Add needs alpha one screen next ov

## 2016-03-26 NOTE — Progress Notes (Signed)
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 Pulmonary Consult    Referred by Dr. Jeffie Pollock for eval of pulmonary hypertension. Pt c/o occ DOE with lifting heavy objects. He also c/o hemoptysis x 3 months.   empyema dates back to at least 2003 with prev thoracotomy and re-VATS 10/02/2015 same finding with persistent doe > RHC 02/16/16 c/w PH @ 37 mean/with pcwp 9. Does fine walking flat at slow pace = MMRC2 = can't walk a nl pace on a flat grade s sob but does fine slow and flat eg [employer] all day long assoc with minimal cough prod slt blood streaked sputum x 3 m mostly in ams on no meds at all.   No obvious day to day or daytime variability or assoc pleuritic cp or chest tightness, subjective wheeze or overt sinus or hb symptoms. No unusual exp hx or h/o childhood pna/ asthma or knowledge of premature birth.  Sleeping ok without nocturnal  or early am exacerbation  of respiratory  c/o's or need for noct saba. Also denies any obvious fluctuation of symptoms with weather or environmental changes or other aggravating or alleviating factors except as outlined above   Current Medications, Allergies, Complete Past Medical History, Past Surgical History, Family History, and Social History were reviewed in [employer] record.  ROS  The following are not active complaints unless bolded sore throat, dysphagia, dental problems, itching, sneezing,  nasal congestion or excess/ purulent secretions, ear ache,   fever, chills, sweats, unintended wt loss, classically pleuritic or exertional cp, hemoptysis,  orthopnea pnd or leg swelling, presyncope,  palpitations, abdominal pain, anorexia, nausea, vomiting, diarrhea  or change in bowel or bladder habits, change in stools or urine, dysuria,hematuria,  rash, arthralgias, visual complaints, headache, numbness, weakness or ataxia or problems with walking or coordination,  change in mood/affect or memory.          Review of Systems     Objective:   Physical Exam    thin amb chronically ill vietnamese male nad RA but sats at 61% walking one lap    Wt Readings from Last 3 Encounters:  03/26/16 94 lb 3.2 oz (42.729 kg)  03/14/16 92 lb (41.731 kg)  03/11/16 92 lb 8 oz (41.958 kg)    Vital signs reviewed    HEENT: nl dentition, turbinates, and oropharynx. Nl external ear canals without cough reflex   NECK :  without JVD/Nodes/TM/ nl carotid upstrokes bilaterally   LUNGS: no acc muscle use,  slt barrel shaped chest/ surg scars on L base  where has bronchial changes on auscultation.   CV:  RRR  no s3 or murmur  - def  increase in P2, no edema   ABD:  soft and nontender with nl inspiratory excursion in the supine position. No bruits or organomegaly, bowel sounds nl  MS:  Nl gait/ ext warm without deformities, calf tenderness, cyanosis or clubbing No obvious joint restrictions   SKIN: warm and dry without lesions    NEURO:  alert, approp, nl sensorium with  no motor deficits     I personally reviewed images  and agree with radiology impression as follows:  CXR:  03/14/16 Grossly stable appearance of left-sided loculated hydropneumothorax compared to prior exam.  Assessment:

## 2016-03-26 NOTE — Assessment & Plan Note (Addendum)
RHC  02/16/16   PA 37 - PCWP 9   With CO 4.1 and PVR 6.8 > seen 03/26/2016 rec 24 h 02 @ 4lpm and trial of rx of copd with Anoro   This is clearly not PAH given the assoc chronic lung dz and hypoxemia> more of a cor pulmonale picutre >  so no role for vasodilator rx at this point, try to improve both if possible and recheck echo p a minimum of a month of rx then ? Second opinion by Dr Lake Bells?  Total time devoted to counseling  = 35/82m review case with pt/uncle who speaks English and helped translate at those times pt didn't understand complex questions or recs)  discussion of options/alternatives/ personally creating written instructions  in presence of pt  then going over those specific  Instructions directly with the pt including how to use all of the meds but in particular covering each new medication in detail and the difference between the maintenance/automatic meds and the prns using an action plan format for the latter.

## 2016-03-27 NOTE — Assessment & Plan Note (Addendum)
pfts 02/16/16  fev1  0.74 (28%) ratio 46 and DLCO 54/94 corrected  However, cause is unclear / very unlikely to have alpha one but need to check next ov, no hx to suggest prematurity, environmental smoke exp since moved here young age, no hx of chronic asthma  rec trial of anoro x 6 weeks samples then regroup (note complete absence of studies in pts s smoking hx and dx of copd so rx as N = 1)   - The proper method of use, as well as anticipated side effects, of a metered-dose inhaler are discussed and demonstrated to the patient. Improved effectiveness after extensive coaching during this visit to a level of approximately 90 % from a baseline of 75 %

## 2016-04-02 DIAGNOSIS — Z736 Limitation of activities due to disability: Secondary | ICD-10-CM

## 2016-04-04 ENCOUNTER — Telehealth: Payer: Self-pay | Admitting: Internal Medicine

## 2016-04-04 NOTE — Telephone Encounter (Signed)
Spoke with pt. He is needing back the disability paperwork he gave Magda Paganini on 03/26/16. Per Magda Paganini, we do not have a disability paperwork for this pt. I tried to explain this to the pt but the language barrier made this difficult. He had a Optometrist with him and he escalated the conversation. He began blaming [place] for losing the form. I advised both men to get another form and to take to our medical records department for it to be processed. They then left the office. Nothing further was needed.

## 2016-04-23 ENCOUNTER — Other Ambulatory Visit: Payer: BLUE CROSS/BLUE SHIELD

## 2016-04-23 ENCOUNTER — Ambulatory Visit (INDEPENDENT_AMBULATORY_CARE_PROVIDER_SITE_OTHER): Payer: BLUE CROSS/BLUE SHIELD | Admitting: Internal Medicine

## 2016-04-23 ENCOUNTER — Encounter: Payer: Self-pay | Admitting: Internal Medicine

## 2016-04-23 VITALS — BP 152/86 | HR 93 | Ht 60.0 in | Wt 98.2 lb

## 2016-04-23 DIAGNOSIS — I272 Other secondary pulmonary hypertension: Secondary | ICD-10-CM

## 2016-04-23 DIAGNOSIS — J449 Chronic obstructive pulmonary disease, unspecified: Secondary | ICD-10-CM

## 2016-04-23 DIAGNOSIS — J9611 Chronic respiratory failure with hypoxia: Secondary | ICD-10-CM

## 2016-04-23 MED ORDER — UMECLIDINIUM-VILANTEROL 62.5-25 MCG/INH IN AEPB: 2.0000 | INHALATION_SPRAY | Freq: Once | RESPIRATORY_TRACT | Status: DC

## 2016-04-23 NOTE — Progress Notes (Signed)
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 Pulmonary Consult    Referred by Dr. Jeffie [name] for eval of pulmonary hypertension. Pt c/o occ DOE with lifting heavy objects. He also c/o hemoptysis x 3 months.   empyema dates back to at least 2003 with prev thoracotomy and re-VATS 10/02/2015 same finding with persistent doe > RHC 02/16/16 c/w PH @ 37 mean/with pcwp 9. Does fine walking flat at slow pace = MMRC2 = can't walk a nl pace on a flat grade s sob but does fine slow and flat eg [employer] all day long assoc with minimal cough prod slt blood streaked sputum x 3 m mostly in ams on no meds at all.  rec Please see patient coordinator before you leave today  to schedule 02 4lpm 24/7 - wear it as much as you can Try anoro 1 click each am  Please schedule a follow up office visit in 4 weeks, sooner if needed  Add needs alpha one screen next ov    04/23/2016  f/u ov/[name] [name] re: "COPD"  Never smoker    Chief Complaint  Patient presents with  . Follow-up    Breathing has improved but not back to baseline. He has only been using o2 "three times a day, it causes nose to bleed".  really can't use 02 daytime / noct 02 on 4lpm / really Not limited by breathing from desired activities  While on anoro.    No obvious day to day or daytime variability or assoc pleuritic cp or chest tightness, subjective wheeze or overt sinus or hb symptoms. No unusual exp hx or h/o childhood pna/ asthma or knowledge of premature birth.  Sleeping ok without nocturnal  or early am exacerbation  of respiratory  c/o's or need for noct saba. Also  denies any obvious fluctuation of symptoms with weather or environmental changes or other aggravating or alleviating factors except as outlined above   Current Medications, Allergies, Complete Past Medical History, Past Surgical History, Family History, and Social History were reviewed in [employer] record.  ROS  The following are not active complaints unless bolded sore throat, dysphagia, dental problems, itching, sneezing,  nasal congestion or excess/ purulent secretions, ear ache,   fever, chills, sweats, unintended wt loss, classically pleuritic or exertional cp, hemoptysis,  orthopnea pnd or leg swelling, presyncope, palpitations, abdominal pain, anorexia, nausea, vomiting, diarrhea  or change in bowel or bladder habits, change in stools or urine, dysuria,hematuria,  rash, arthralgias, visual complaints, headache, numbness, weakness or ataxia or problems with walking or coordination,  change in mood/affect or memory.                Objective:   Physical Exam    thin amb chronically ill vietnamese male nad     04/23/2016       98   03/26/16 94 lb 3.2 oz (42.729 kg)  03/14/16 92 lb (41.731 kg)  03/11/16 92 lb 8 oz (41.958 kg)    Vital signs reviewed    HEENT: nl dentition, turbinates, and oropharynx.  Nl external ear canals without cough reflex   NECK :  without JVD/Nodes/TM/ nl carotid upstrokes bilaterally   LUNGS: no acc muscle use,  slt barrel shaped chest/ surg scars on L base  where has bronchial changes on auscultation.   CV:  RRR  no s3 or murmur  - def  increase in P2, no edema   ABD:  soft and nontender with nl inspiratory excursion in the supine position. No bruits or organomegaly, bowel sounds nl  MS:  Nl gait/ ext warm without deformities, calf tenderness, cyanosis or clubbing No obvious joint restrictions   SKIN: warm and dry without lesions    NEURO:  alert, approp, nl sensorium with  no motor deficits     I personally reviewed  images and agree with radiology impression as follows:  CXR:  03/14/16 Grossly stable appearance of left-sided loculated hydropneumothorax compared to prior exam.  Assessment:

## 2016-04-23 NOTE — Patient Instructions (Addendum)
Continue Anoro one click each am   Continue 02 at bedtime but just use 2lpm for now as irritates nose less   Please remember to go to the lab   department downstairs for your tests - we will call you with the results when they are available.     Please schedule a follow up visit in 3 months but call sooner if needed

## 2016-04-24 LAB — RESPIRATORY ALLERGY PROFILE REGION II ~~LOC~~
ALLERGEN, COTTONWOOD, T14: 0.2 kU/L — AB
ALLERGEN, D PTERNOYSSINUS, D1: 0.17 kU/L — AB
ALLERGEN, MULBERRY, T70: 0.16 kU/L — AB
ASPERGILLUS FUMIGATUS M3: 0.61 kU/L — AB
Allergen, Cedar tree, t12: 0.25 kU/L — ABNORMAL HIGH
Allergen, Comm Silver Birch, t9: 0.11 kU/L — ABNORMAL HIGH
Allergen, Mouse Urine Protein, e78: <0.1 kU/L
Allergen, Oak,t7: 0.18 kU/L — ABNORMAL HIGH
Alternaria Alternata: <0.1 kU/L
BERMUDA GRASS: 0.26 kU/L — AB
Box Elder IgE: 0.21 kU/L — ABNORMAL HIGH
CLADOSPORIUM HERBARUM: 0.13 kU/L — AB
COCKROACH: 0.32 kU/L — AB
Cat Dander: <0.1 kU/L
Common Ragweed: 0.26 kU/L — ABNORMAL HIGH
D. FARINAE: 0.81 kU/L — AB
DOG DANDER: 0.11 kU/L — AB
ELM IGE: 0.19 kU/L — AB
IgE (Immunoglobulin E), Serum: 2066 kU/L — ABNORMAL HIGH (ref <115)
JOHNSON GRASS: 0.46 kU/L — AB
Pecan/Hickory Tree IgE: 0.15 kU/L — ABNORMAL HIGH
Penicillium Notatum: 0.37 kU/L — ABNORMAL HIGH
Rough Pigweed  IgE: 0.16 kU/L — ABNORMAL HIGH
SHEEP SORREL IGE: 0.26 kU/L — AB
Timothy Grass: 0.23 kU/L — ABNORMAL HIGH

## 2016-04-24 LAB — IGG, IGA, IGM
IgA: 143 mg/dL (ref 81–463)
IgG (Immunoglobin G), Serum: >15300 mg/dL — ABNORMAL HIGH (ref 694–1618)
IgM, Serum: 45 mg/dL — ABNORMAL LOW (ref 48–271)

## 2016-04-24 NOTE — Assessment & Plan Note (Signed)
Echo 01/14/16 LVEF 0000000, septal diastolic and systolic flattening. Trivial MR, Mildly dilated RV with mild/mod reduced RV function. Mild TR, PA peak pressure 81 mm Hg.  RHC  02/16/16   PA 37 - PCWP 9  With CO 4.1 and PVR 6.8 > seen 03/26/2016 rec 24 h 02 @ 4lpm and trial of rx of copd with Anoro > non compliance documented 04/23/2016 so try 2lpm / repeat ono on 2lpm

## 2016-04-24 NOTE — Assessment & Plan Note (Signed)
pfts 02/16/16  fev1  0.74 (28%) ratio 46 and DLCO 54/94 corrected - anoro trial 03/26/16 >>> improved at ov 04/23/2016  - Alpha one screening 04/23/2016 >>>  He is much better clinically on anoro typical of a Group B COPD pt so rec he continue  04/23/2016  After extensive coaching HFA effectiveness =    90%   Each maintenance medication was reviewed in detail using an interpreter including most importantly the difference between maintenance and as needed and under what circumstances the prns are to be used.  Please see instructions for details which were reviewed in writing and the patient given a copy.

## 2016-04-24 NOTE — Assessment & Plan Note (Signed)
03/26/2016   Walked RA x one lap @ 185 stopped due to  desat to 61% and 90% on 4lpm  - 03/26/2016 rec 4lpm 24/7 > repeat echo in 4 weeks > not done - 04/23/2016 sats ok on RA > rec change 02 to 2lpm hs only (to improve compliance)   He is not wearing 02 due to nasal irritation but the good news is he may not it as much anyway at this point > rec try 2lpm hs and recheck ono on 2lpm

## 2016-04-25 LAB — ALPHA-1-ANTITRYPSIN: A1 ANTITRYPSIN SER: 176 mg/dL (ref 83–199)

## 2016-04-27 LAB — ALPHA-1 ANTITRYPSIN PHENOTYPE: A-1 Antitrypsin: 151 mg/dL (ref 83–199)

## 2016-05-30 ENCOUNTER — Encounter (HOSPITAL_COMMUNITY): Payer: BLUE CROSS/BLUE SHIELD | Admitting: Internal Medicine

## 2016-05-30 ENCOUNTER — Ambulatory Visit (HOSPITAL_COMMUNITY)
Admission: RE | Admit: 2016-05-30 | Discharge: 2016-05-30 | Disposition: A | Discharge status: Home / Self Care | Payer: BLUE CROSS/BLUE SHIELD | Source: Ambulatory Visit | Attending: Nurse Practitioner | Admitting: Nurse Practitioner

## 2016-05-30 DIAGNOSIS — I119 Hypertensive heart disease without heart failure: Secondary | ICD-10-CM | POA: Diagnosis not present

## 2016-05-30 DIAGNOSIS — I272 Other secondary pulmonary hypertension: Secondary | ICD-10-CM | POA: Diagnosis present

## 2016-05-30 NOTE — Progress Notes (Signed)
  Echocardiogram 2D Echocardiogram has been performed.  Tresa Res 05/30/2016, 3:28 PM

## 2016-06-17 ENCOUNTER — Ambulatory Visit (HOSPITAL_COMMUNITY)
Admission: RE | Admit: 2016-06-17 | Discharge: 2016-06-17 | Disposition: A | Discharge status: Home / Self Care | Payer: BLUE CROSS/BLUE SHIELD | Source: Ambulatory Visit | Attending: Internal Medicine | Admitting: Internal Medicine

## 2016-06-17 ENCOUNTER — Encounter (HOSPITAL_COMMUNITY): Payer: Self-pay | Admitting: *Deleted

## 2016-06-17 VITALS — BP 122/78 | HR 89 | Wt 97.0 lb

## 2016-06-17 DIAGNOSIS — I10 Essential (primary) hypertension: Secondary | ICD-10-CM | POA: Diagnosis not present

## 2016-06-17 DIAGNOSIS — I272 Other secondary pulmonary hypertension: Secondary | ICD-10-CM | POA: Diagnosis not present

## 2016-06-17 DIAGNOSIS — Z87442 Personal history of urinary calculi: Secondary | ICD-10-CM | POA: Insufficient documentation

## 2016-06-17 DIAGNOSIS — J449 Chronic obstructive pulmonary disease, unspecified: Secondary | ICD-10-CM | POA: Insufficient documentation

## 2016-06-17 DIAGNOSIS — I2781 Cor pulmonale (chronic): Secondary | ICD-10-CM | POA: Diagnosis not present

## 2016-06-17 DIAGNOSIS — Z9119 Patient's noncompliance with other medical treatment and regimen: Secondary | ICD-10-CM | POA: Insufficient documentation

## 2016-06-17 DIAGNOSIS — R0602 Shortness of breath: Secondary | ICD-10-CM

## 2016-06-17 NOTE — Patient Instructions (Signed)
Right Heart Catheterization, see instruction sheet  Your physician recommends that you schedule a follow-up appointment in: 6 weeks

## 2016-06-17 NOTE — Progress Notes (Signed)
Patient ID: [name] [name], male   DOB: May 25, 1961, 55 y.o.   MRN: [id number]  ADVANCED HF CLINIC NOTE  Patient ID: [name] [name], male   DOB: 08-23-1961, 55 y.o.   MRN: [id number] PCP: Primary Cardiologist:  HPI:  [name] [name] is a 55 y.o. [place] male with PMH of urolithiasis, HTN, chronic empyema s/p remote lung decortication and PAH.  He was admitted to [facility] in 09/2015 for empyema, underwent a VATs procedure and chest tube placement and PICC line placement for outpatient IV ABX. He was non compliant with Cardiothoracic surgery and other provider visits and continued to go to the ED for care, where his chest tube was eventually removed in 10/2015. He was readmitted in 3/16 with hemoptysis and recurrent empyema. Chest tube placed. Culture + methicillin resistant CN Staph. Echo with evidence of PAH.  Underwent RHC on 01/18/16 with mild to moderate PAH felt to be due to hypoxic lung disease.  RA = 4 RV = 54/3/7 PA = 59/24 (37) PCW = 9 Fick cardiac output/index = 4.1/3.2 PVR = 6.8 WU FA sat = 95% PA sat = 68%, 68%  PFTs 02/16/16 FVC 1.63 (48%) FEV1 0.75 (28%) FEV1/FVC ratio 46% TLC 2.88 (58%) DLCO 10.3 (54%)  He presents today for regular follow up. Recently saw Dr. Melvyn [name] in [place] and was non-compliant with his O2. Also no longer taking sildenafil. Says he is feeling better but still SOB if he tires to lift something heavy. No orthopnea, PND, edema, syncope. No cough.    Had echo 7/17. LVEF 55-60% MildlyD-shaped septum. RV dilated with normal function. No TR jet to measure PA pressures.    Echo 01/14/16 LVEF 0000000, septal diastolic and systolic flattening. Trivial MR, Mildly dilated RV with mild/mod reduced RV function. Mild TR, PA peak pressure 81 mm Hg.  Echo 7/17. LVEF 55-60% MildlyD-shaped septum. RV dilated with mild HK. No TR jet to measure PA pressures.   ROS: All systems negative except as listed in HPI, PMH and Problem List.  SH:  Social History   Social History  . Marital  status: Single    Spouse name: N/A  . Number of children: N/A  . Years of education: N/A   Occupational History  . Not on file.   Social History Main Topics  . Smoking status: Never Smoker  . Smokeless tobacco: Not on file  . Alcohol use Yes  . Drug use: No  . Sexual activity: Not on file   Other Topics Concern  . Not on file   Social History Narrative  . No narrative on file    FH: No family history on file.  Past Medical History:  Diagnosis Date  . Hypertension   . Kidney stones     Current Outpatient Prescriptions  Medication Sig Dispense Refill  . OXYGEN 4lpm 24/7    . umeclidinium-vilanterol (ANORO ELLIPTA) 62.5-25 MCG/INH AEPB Inhale 2 puffs into the lungs once. Only open the device one time and then take your two separate drags to be sure you get it all 1 each 11   No current facility-administered medications for this encounter.     Vitals:   06/17/16 1051  BP: 122/78  Pulse: 89  SpO2: 96%  Weight: 97 lb (44 kg)   Wt Readings from Last 3 Encounters:  06/17/16 97 lb (44 kg)  04/23/16 98 lb 3.2 oz (44.5 kg)  03/26/16 94 lb 3.2 oz (42.7 kg)     PHYSICAL EXAM:  General:  Well appearing. No resp difficulty,  very thin HEENT: normal Neck: supple. JVP 6. Carotids 2+ bilaterally; no bruits. No thyromegaly or nodule noted.  Cor: PMI normal. RRR. No M/G/R. Normal S1, Prominent S2, minimal TR Lungs: clear diminished at bases  Abdomen: soft, non-tender, non-distended, no HSM. No bruits or masses. +BS  Extremities: no cyanosis, rash, or edema. + Clubbing Neuro: alert & orientedx3, cranial nerves grossly intact. Moves all 4 extremities w/o difficulty. Affect pleasant.  ASSESSMENT & PLAN: 1. Pulmonary HTN with cor pulmonale - Think primarily Group 3 with likely component of Group 1 with improvement on sildenafil.  - PFTs with DLCO 54%, bad obstructive disease. - RHC on 01/18/16 with mild to moderate PAH felt to be due to hypoxic lung disease.   - PA pressure  81 mm HG by echo 3/17. Still with RH strain on echo 7/17 but no TR jet to measure RVSP - ANA negative, ANCA negative, RF negative. Recent HIV/HCV test negative - VQ scan low probability. - LE dopplers negative for DVT.  - Feels ok off sildenafil. Echo viewed personally. Still with some RH strain but unable to measure estimate PAP due to no TR. I suspect most of his PH is due to Group 3 disease but may have component of Group 1. He has stopped sildenafil. Given persistent RV strain wil repeat RHC to reassess pressures. If up markedly, may consider Adcirca 2. Recurrent left-sided empyema s/p drainage - Followed by Dr [name] [name]. - Finished course of IV vanc 02/06/16  - CXR 5/17 stable.   3. COPD - Severe by PFTs 02/16/16 FVC 1.63 (48%), FEV1 0.75 (28%), FEV1/FVC ratio 46%, TLC 2.88 (58%),  DLCO 10.3 (54%) - Has seen Dr. Melvyn [name]. Non-compliant with O2  [name], Daniel,MD 11:19 AM

## 2016-06-20 ENCOUNTER — Other Ambulatory Visit (HOSPITAL_COMMUNITY): Payer: Self-pay | Admitting: *Deleted

## 2016-06-20 ENCOUNTER — Other Ambulatory Visit: Payer: Self-pay | Admitting: Cardiothoracic Surgery

## 2016-06-20 ENCOUNTER — Ambulatory Visit
Admission: RE | Admit: 2016-06-20 | Discharge: 2016-06-20 | Disposition: A | Discharge status: Home / Self Care | Payer: BLUE CROSS/BLUE SHIELD | Source: Ambulatory Visit | Attending: Cardiothoracic Surgery | Admitting: Cardiothoracic Surgery

## 2016-06-20 ENCOUNTER — Encounter: Payer: Self-pay | Admitting: Cardiothoracic Surgery

## 2016-06-20 ENCOUNTER — Ambulatory Visit: Payer: BLUE CROSS/BLUE SHIELD | Admitting: Cardiothoracic Surgery

## 2016-06-20 ENCOUNTER — Ambulatory Visit (INDEPENDENT_AMBULATORY_CARE_PROVIDER_SITE_OTHER): Payer: BLUE CROSS/BLUE SHIELD | Admitting: Cardiothoracic Surgery

## 2016-06-20 VITALS — BP 122/70 | HR 76 | Resp 18 | Ht 60.0 in | Wt 97.0 lb

## 2016-06-20 DIAGNOSIS — R6510 Systemic inflammatory response syndrome (SIRS) of non-infectious origin without acute organ dysfunction: Secondary | ICD-10-CM

## 2016-06-20 DIAGNOSIS — I272 Other secondary pulmonary hypertension: Secondary | ICD-10-CM

## 2016-06-20 DIAGNOSIS — J948 Other specified pleural conditions: Secondary | ICD-10-CM | POA: Diagnosis not present

## 2016-06-20 DIAGNOSIS — J90 Pleural effusion, not elsewhere classified: Secondary | ICD-10-CM

## 2016-06-20 DIAGNOSIS — Z09 Encounter for follow-up examination after completed treatment for conditions other than malignant neoplasm: Secondary | ICD-10-CM

## 2016-06-20 DIAGNOSIS — J869 Pyothorax without fistula: Secondary | ICD-10-CM

## 2016-06-20 DIAGNOSIS — J9611 Chronic respiratory failure with hypoxia: Secondary | ICD-10-CM

## 2016-06-20 NOTE — Progress Notes (Signed)
[address]       [place] [address]             [phone]      [name] [name] [place] Medical Record [id number] Date of Birth: Jul 25, 1961  Referring: Lacretia Leigh, MD Primary Care: Truitt Merle, NP  Chief Complaint:   [place]  History of Present Illness:     Patient returns to the office stay after recent hospitalization for hypoxia and recurrent left empyema,  He denies any fever or chills. His cough has improved. Overall he is much improved, denies fever and chills. Has returned to work full-time but avoiding heavy strenuous lifting.  He was  in the heart failure clinic for evaluation of his enlarged pulmonary arteries and question of pulmonary hypertension.  Past Surgical History:  Procedure Laterality Date  . CARDIAC CATHETERIZATION N/A 01/18/2016   Procedure: Right Heart Cath;  Surgeon: Jolaine Artist, MD;  Location: [place] [facility];  Service: Cardiovascular;  Laterality: N/A;  . KIDNEY SURGERY    . VIDEO ASSISTED THORACOSCOPY (VATS)/DECORTICATION Left 2003  . VIDEO ASSISTED THORACOSCOPY (VATS)/DECORTICATION Left 10/02/2015   Procedure: VIDEO ASSISTED THORACOSCOPY (VATS)/DECORTICATION and drainage of chronic empyena;  Surgeon: [name], MD;  Location: [facility];  Service: Thoracic;  Laterality: Left;  Marland Kitchen VIDEO BRONCHOSCOPY N/A 10/02/2015   Procedure: VIDEO BRONCHOSCOPY;  Surgeon: [name], MD;  Location: [facility] OR;  Service: Thoracic;  Laterality: N/A;     Past Medical History:  Diagnosis Date  . Hypertension   . Kidney stones      History  Smoking Status  . Never Smoker  Smokeless Tobacco  . Not on file    History  Alcohol Use  . Yes     No Known Allergies  Current Outpatient Prescriptions  Medication Sig Dispense Refill  . OXYGEN 4lpm 24/7    . umeclidinium-vilanterol (ANORO ELLIPTA) 62.5-25 MCG/INH AEPB Inhale 2 puffs into the lungs once. Only open the device one time and then take your two  separate drags to be sure you get it all (Patient taking differently: Inhale 2 puffs into the lungs at bedtime. Only open the device one time and then take your two separate drags to be sure you get it all) 1 each 11   No current facility-administered medications for this visit.        Physical Exam: BP 122/70 (BP Location: Left Arm, Patient Position: Sitting, Cuff Size: Small)   Pulse 76   Resp 18   Ht 5' (1.524 m)   Wt 97 lb (44 kg)   SpO2 93%   BMI 18.94 kg/m   General appearance: alert and cooperative, overall the patient looks much better, breathing easily, is not dyspneic walking around the office Neurologic: intact Heart: regular rate and rhythm, S1, S2 normal, no murmur, click, rub or gallop Lungs: diminished breath sounds LLL Abdomen: soft, non-tender; bowel sounds normal; no masses,  no organomegaly Extremities: extremities normal, atraumatic, no cyanosis or edema and Homans sign is negative, no sign of DVT Wound: All chest tube sites are well-healed   Diagnostic Studies & Laboratory data:     Recent Radiology Findings:   No results found.    Recent Lab Findings: Lab Results  Component Value Date   WBC 7.6 01/22/2016   HGB 10.6 (L) 01/22/2016   HCT 35.0 (L) 01/22/2016   PLT 376 01/22/2016   GLUCOSE 90 01/22/2016   ALT 14 (L) 01/15/2016  AST 24 01/15/2016   NA 139 01/22/2016   K 3.8 01/22/2016   CL 101 01/22/2016   CREATININE 0.94 01/22/2016   BUN 11 01/22/2016   CO2 29 01/22/2016   INR 1.12 01/11/2016      Assessment / Plan:      Patient seems improved from his previous visit, He is back working full-time but avoids any heavy and strenuous work. His chest x-ray is stable, without signs or symptoms of recurrent infection We'll plan to see the patient back in 6 months with a follow-up chest x-ray.    [name] MD      [address] [place] [place] [address] Office [phone]   Beeper [phone]  06/20/2016 4:31  PM

## 2016-06-24 ENCOUNTER — Encounter (HOSPITAL_COMMUNITY): Payer: Self-pay | Admitting: Internal Medicine

## 2016-06-24 ENCOUNTER — Ambulatory Visit (HOSPITAL_COMMUNITY)
Admission: RE | Admit: 2016-06-24 | Discharge: 2016-06-24 | Disposition: A | Discharge status: Home / Self Care | Payer: BLUE CROSS/BLUE SHIELD | Source: Ambulatory Visit | Attending: Internal Medicine | Admitting: Internal Medicine

## 2016-06-24 ENCOUNTER — Encounter (HOSPITAL_COMMUNITY)
Admission: RE | Disposition: A | Discharge status: Home / Self Care | Payer: Self-pay | Source: Ambulatory Visit | Attending: Internal Medicine

## 2016-06-24 DIAGNOSIS — I272 Other secondary pulmonary hypertension: Secondary | ICD-10-CM | POA: Diagnosis present

## 2016-06-24 DIAGNOSIS — I10 Essential (primary) hypertension: Secondary | ICD-10-CM | POA: Insufficient documentation

## 2016-06-24 DIAGNOSIS — Z87442 Personal history of urinary calculi: Secondary | ICD-10-CM | POA: Diagnosis not present

## 2016-06-24 HISTORY — PX: CARDIAC CATHETERIZATION: SHX172

## 2016-06-24 LAB — BASIC METABOLIC PANEL
Anion gap: 3 — ABNORMAL LOW (ref 5–15)
BUN: 6 mg/dL (ref 6–20)
CHLORIDE: 102 mmol/L (ref 101–111)
CO2: 30 mmol/L (ref 22–32)
CREATININE: 0.94 mg/dL (ref 0.61–1.24)
Calcium: 8.1 mg/dL — ABNORMAL LOW (ref 8.9–10.3)
GFR calc non Af Amer: >60 mL/min (ref >60)
Glucose, Bld: 104 mg/dL — ABNORMAL HIGH (ref 65–99)
POTASSIUM: 4.2 mmol/L (ref 3.5–5.1)
Sodium: 135 mmol/L (ref 135–145)

## 2016-06-24 LAB — POCT I-STAT 3, VENOUS BLOOD GAS (G3P V)
ACID-BASE EXCESS: 3 mmol/L — AB (ref 0.0–2.0)
ACID-BASE EXCESS: 5 mmol/L — AB (ref 0.0–2.0)
BICARBONATE: 28.4 meq/L — AB (ref 20.0–24.0)
BICARBONATE: 30.2 meq/L — AB (ref 20.0–24.0)
O2 SAT: 66 %
O2 SAT: 67 %
PCO2 VEN: 45.6 mmHg (ref 45.0–50.0)
PCO2 VEN: 47.1 mmHg (ref 45.0–50.0)
PH VEN: 7.416 — AB (ref 7.250–7.300)
PO2 VEN: 35 mmHg (ref 31.0–45.0)
PO2 VEN: 35 mmHg (ref 31.0–45.0)
TCO2: 30 mmol/L (ref 0–100)
TCO2: 32 mmol/L (ref 0–100)
pH, Ven: 7.402 — ABNORMAL HIGH (ref 7.250–7.300)

## 2016-06-24 LAB — CBC
HEMATOCRIT: 40.4 % (ref 39.0–52.0)
HEMOGLOBIN: 13.2 g/dL (ref 13.0–17.0)
MCH: 27.2 pg (ref 26.0–34.0)
MCHC: 32.7 g/dL (ref 30.0–36.0)
MCV: 83.1 fL (ref 78.0–100.0)
Platelets: 335 10*3/uL (ref 150–400)
RBC: 4.86 MIL/uL (ref 4.22–5.81)
RDW: 13.9 % (ref 11.5–15.5)
WBC: 9.4 10*3/uL (ref 4.0–10.5)

## 2016-06-24 LAB — POCT ACTIVATED CLOTTING TIME
ACTIVATED CLOTTING TIME: 142 s
ACTIVATED CLOTTING TIME: 147 s

## 2016-06-24 LAB — PROTIME-INR
INR: 1.15
Prothrombin Time: 14.8 seconds (ref 11.4–15.2)

## 2016-06-24 SURGERY — RIGHT HEART CATH
Anesthesia: LOCAL

## 2016-06-24 MED ORDER — ASPIRIN 81 MG PO CHEW
81.0000 mg | CHEWABLE_TABLET | ORAL | Status: AC
  Administered 2016-06-24: 81 mg via ORAL

## 2016-06-24 MED ORDER — HEPARIN (PORCINE) IN NACL 2-0.9 UNIT/ML-% IJ SOLN
INTRAMUSCULAR | Status: AC
  Filled 2016-06-24: qty 500

## 2016-06-24 MED ORDER — LIDOCAINE HCL (PF) 1 % IJ SOLN
INTRAMUSCULAR | Status: AC
  Filled 2016-06-24: qty 30

## 2016-06-24 MED ORDER — SODIUM CHLORIDE 0.9 % IV SOLN: 250.0000 mL | INTRAVENOUS | Status: DC | PRN

## 2016-06-24 MED ORDER — HEPARIN (PORCINE) IN NACL 2-0.9 UNIT/ML-% IJ SOLN
INTRAMUSCULAR | Status: DC | PRN
  Administered 2016-06-24: 500 mL

## 2016-06-24 MED ORDER — SODIUM CHLORIDE 0.9% FLUSH: 3.0000 mL | INTRAVENOUS | Status: DC | PRN

## 2016-06-24 MED ORDER — ASPIRIN 81 MG PO CHEW
CHEWABLE_TABLET | ORAL | Status: AC
  Filled 2016-06-24: qty 1

## 2016-06-24 MED ORDER — SODIUM CHLORIDE 0.9% FLUSH: 3.0000 mL | Freq: Two times a day (BID) | INTRAVENOUS | Status: DC

## 2016-06-24 MED ORDER — SODIUM CHLORIDE 0.9 % IV SOLN
INTRAVENOUS | Status: DC
  Administered 2016-06-24: 08:00:00 via INTRAVENOUS

## 2016-06-24 MED ORDER — LIDOCAINE HCL (PF) 1 % IJ SOLN
INTRAMUSCULAR | Status: DC | PRN
  Administered 2016-06-24: 4 mL

## 2016-06-24 SURGICAL SUPPLY — 10 items
CATH BALLN WEDGE 5F 110CM (CATHETERS) ×2 IMPLANT
GUIDEWIRE .025 260CM (WIRE) ×2 IMPLANT
KIT HEART RIGHT NAMIC (KITS) ×2 IMPLANT
PACK CARDIAC CATHETERIZATION (CUSTOM PROCEDURE TRAY) ×2 IMPLANT
PROTECTION STATION PRESSURIZED (MISCELLANEOUS) ×2
SHEATH FAST CATH BRACH 5F 5CM (SHEATH) ×2 IMPLANT
STATION PROTECTION PRESSURIZED (MISCELLANEOUS) ×1 IMPLANT
TRANSDUCER W/STOPCOCK (MISCELLANEOUS) ×2 IMPLANT
TUBING ART PRESS 72  MALE/FEM (TUBING) ×1
TUBING ART PRESS 72 MALE/FEM (TUBING) ×1 IMPLANT

## 2016-06-24 NOTE — Interval H&P Note (Signed)
History and Physical Interval Note:  06/24/2016 8:42 AM  [name] [name]  has presented today for surgery, with the diagnosis of pulmonary hypertension  The various methods of treatment have been discussed with the patient and family. After consideration of risks, benefits and other options for treatment, the patient has consented to  Procedure(s): Right Heart Cath (N/A) as a surgical intervention .  The patient's history has been reviewed, patient examined, no change in status, stable for surgery.  I have reviewed the patient's chart and labs.  Questions were answered to the patient's satisfaction.     Bensimhon, Quillian Quince

## 2016-06-24 NOTE — H&P (View-Only) (Signed)
[address]       [place] [address]             [phone]      [name] [name] [place] Medical Record [id number] Date of Birth: Oct 18, 1961  Referring: Lacretia Leigh, MD Primary Care: Truitt Merle, NP  Chief Complaint:   [name] [name]  History of Present Illness:     Patient returns to the office stay after recent hospitalization for hypoxia and recurrent left empyema,  He denies any fever or chills. His cough has improved. Overall he is much improved, denies fever and chills. Has returned to work full-time but avoiding heavy strenuous lifting.  He was  in the heart failure clinic for evaluation of his enlarged pulmonary arteries and question of pulmonary hypertension.  Past Surgical History:  Procedure Laterality Date  . CARDIAC CATHETERIZATION N/A 01/18/2016   Procedure: Right Heart Cath;  Surgeon: Jolaine Artist, MD;  Location: [place] [facility];  Service: Cardiovascular;  Laterality: N/A;  . KIDNEY SURGERY    . VIDEO ASSISTED THORACOSCOPY (VATS)/DECORTICATION Left 2003  . VIDEO ASSISTED THORACOSCOPY (VATS)/DECORTICATION Left 10/02/2015   Procedure: VIDEO ASSISTED THORACOSCOPY (VATS)/DECORTICATION and drainage of chronic empyena;  Surgeon: [name], MD;  Location: [place];  Service: Thoracic;  Laterality: Left;  Marland Kitchen VIDEO BRONCHOSCOPY N/A 10/02/2015   Procedure: VIDEO BRONCHOSCOPY;  Surgeon: [name], MD;  Location: [facility] OR;  Service: Thoracic;  Laterality: N/A;     Past Medical History:  Diagnosis Date  . Hypertension   . Kidney stones      History  Smoking Status  . Never Smoker  Smokeless Tobacco  . Not on file    History  Alcohol Use  . Yes     No Known Allergies  Current Outpatient Prescriptions  Medication Sig Dispense Refill  . OXYGEN 4lpm 24/7    . umeclidinium-vilanterol (ANORO ELLIPTA) 62.5-25 MCG/INH AEPB Inhale 2 puffs into the lungs once. Only open the device one time and then take your two  separate drags to be sure you get it all (Patient taking differently: Inhale 2 puffs into the lungs at bedtime. Only open the device one time and then take your two separate drags to be sure you get it all) 1 each 11   No current facility-administered medications for this visit.        Physical Exam: BP 122/70 (BP Location: Left Arm, Patient Position: Sitting, Cuff Size: Small)   Pulse 76   Resp 18   Ht 5' (1.524 m)   Wt 97 lb (44 kg)   SpO2 93%   BMI 18.94 kg/m   General appearance: alert and cooperative, overall the patient looks much better, breathing easily, is not dyspneic walking around the office Neurologic: intact Heart: regular rate and rhythm, S1, S2 normal, no murmur, click, rub or gallop Lungs: diminished breath sounds LLL Abdomen: soft, non-tender; bowel sounds normal; no masses,  no organomegaly Extremities: extremities normal, atraumatic, no cyanosis or edema and Homans sign is negative, no sign of DVT Wound: All chest tube sites are well-healed   Diagnostic Studies & Laboratory data:     Recent Radiology Findings:   No results found.    Recent Lab Findings: Lab Results  Component Value Date   WBC 7.6 01/22/2016   HGB 10.6 (L) 01/22/2016   HCT 35.0 (L) 01/22/2016   PLT 376 01/22/2016   GLUCOSE 90 01/22/2016   ALT 14 (L) 01/15/2016  AST 24 01/15/2016   NA 139 01/22/2016   K 3.8 01/22/2016   CL 101 01/22/2016   CREATININE 0.94 01/22/2016   BUN 11 01/22/2016   CO2 29 01/22/2016   INR 1.12 01/11/2016      Assessment / Plan:      Patient seems improved from his previous visit, He is back working full-time but avoids any heavy and strenuous work. His chest x-ray is stable, without signs or symptoms of recurrent infection We'll plan to see the patient back in 6 months with a follow-[name] chest x-ray.    [name] MD      [address] [place] [address] Office [phone]   Beeper [phone]  06/20/2016 4:31  PM

## 2016-06-24 NOTE — Discharge Instructions (Signed)
Pulmonary Artery Catheterization Pulmonary artery catheterization is a procedure that is used to test blood movement through the heart and to monitor the heart's function. In this procedure, a thin, flexible tube (catheter) is passed into the right side of the heart and into the main artery that carries blood from your heart to your lungs (pulmonary artery). The procedure may be used to evaluate or help diagnose various problems, such as:  Heart failure.  Shock.  Leaky heart valves (valvular regurgitation).  Congenital heart disease.  Burns.  Kidney disease.  High blood pressure within the arteries in the lungs (pulmonary hypertension).  A buildup of fluid around the heart that prevents the heart from functioning normally (cardiac tamponade).  A disease that causes the heart muscle to become rigid (restrictive cardiomyopathy).  Abnormal blood flow between two areas of the heart (shunt). After a heart attack, this procedure may be used to monitor for further problems and to see if medicines are working. The procedure may be done in a cardiac catheterization lab or in an intensive care unit (ICU). LET [facility] CARE PROVIDER KNOW ABOUT:  Any allergies you have.  All medicines you are taking, including vitamins, herbs, eye drops, creams, and over-the-counter medicines.  Previous problems you or members of your family have had with the use of anesthetics.  Any blood disorders you have.  Previous surgeries you have had.  Any medical conditions you may have. RISKS AND COMPLICATIONS Generally, this is a safe procedure. However, problems may occur, including:  Bruising or bleeding at the catheter insertion site.  Injury to the vein where the catheter was inserted.  Puncture to the lung. This is a risk if neck or chest veins are used. The following problems may also occur, but they are very rare:  Abnormal heart rhythms.  Low blood pressure.  Infection.  Cardiac  tamponade.  Blocked blood vessel caused by a blood clot or foreign material circulating in the blood (embolism). This can be caused by blood clots at the tip of the catheter. BEFORE THE PROCEDURE  Follow instructions from your health care provider about eating or drinking restrictions.  Ask your health care provider about:  Changing or stopping your regular medicines. This is especially important if you are taking diabetes medicines or blood thinners.  Taking medicines such as aspirin and ibuprofen. These medicines can thin your blood. Do not take these medicines before your procedure if your health care provider instructs you not to. PROCEDURE  An IV tube will be inserted into one of your veins.  You may be given a medicine that helps you relax (sedative).  The area of your body that is chosen for insertion of the catheter will be cleaned. This is usually the neck or groin, but the insertion is sometimes done in another area.  You will be given a medicine that numbs this area (local anesthetic).  A small incision will be made in a vein in this area.  A catheter will be inserted through the incision and into the vein. The health care provider will carefully move the catheter into the upper chamber of the heart (right atrium). X-rays may be used to help guide the catheter to the right place.  The catheter will be threaded through two heart valves (tricuspid valve and pulmonary valve) and placed into the pulmonary artery.  As soon as the catheter is in place, the blood pressure in the pulmonary artery will be measured.  During the procedure, your heart's rhythm will be  watched constantly using an electrocardiogram (ECG).  The catheter will be removed after tests and monitoring have been completed. The procedure may vary among health care providers and hospitals. AFTER THE PROCEDURE  Your blood pressure, heart rate, breathing rate, and blood oxygen level will be monitored often until  the medicines you were given have worn off.   This information is not intended to replace advice given to you by your health care provider. Make sure you discuss any questions you have with your health care provider.   Document Released: 02/24/2007 Document Revised: 03/07/2015 Document Reviewed: 10/25/2014 Elsevier Interactive Patient Education [employer].

## 2016-06-24 NOTE — Progress Notes (Signed)
Site area: Right brachial a 5 french sheath was removed  Site Prior to Removal:  Level 0  Pressure Applied For 15 MINUTES    Bedrest Beginning at 0945am  Manual:   Yes.    Patient Status During Pull:  stable  Post Pull Groin Site:  Level 0  Post Pull Instructions Given:  Yes.    Post Pull Pulses Present:  Yes.    Dressing Applied:  Yes.    Comments:  VS remain stable during sheet pull.

## 2016-07-24 ENCOUNTER — Ambulatory Visit: Payer: BLUE CROSS/BLUE SHIELD | Admitting: Internal Medicine

## 2016-07-29 ENCOUNTER — Ambulatory Visit (HOSPITAL_COMMUNITY)
Admission: RE | Admit: 2016-07-29 | Discharge: 2016-07-29 | Disposition: A | Discharge status: Home / Self Care | Payer: BLUE CROSS/BLUE SHIELD | Source: Ambulatory Visit | Attending: Internal Medicine | Admitting: Internal Medicine

## 2016-07-29 VITALS — BP 122/88 | HR 86 | Resp 18 | Wt 93.0 lb

## 2016-07-29 DIAGNOSIS — I10 Essential (primary) hypertension: Secondary | ICD-10-CM | POA: Diagnosis not present

## 2016-07-29 DIAGNOSIS — Z9119 Patient's noncompliance with other medical treatment and regimen: Secondary | ICD-10-CM | POA: Insufficient documentation

## 2016-07-29 DIAGNOSIS — J449 Chronic obstructive pulmonary disease, unspecified: Secondary | ICD-10-CM | POA: Insufficient documentation

## 2016-07-29 DIAGNOSIS — I272 Other secondary pulmonary hypertension: Secondary | ICD-10-CM | POA: Diagnosis not present

## 2016-07-29 DIAGNOSIS — Z87442 Personal history of urinary calculi: Secondary | ICD-10-CM | POA: Insufficient documentation

## 2016-07-29 DIAGNOSIS — Z9981 Dependence on supplemental oxygen: Secondary | ICD-10-CM | POA: Diagnosis not present

## 2016-07-29 DIAGNOSIS — J869 Pyothorax without fistula: Secondary | ICD-10-CM

## 2016-07-29 NOTE — Patient Instructions (Signed)
Start Adcirca 20 mg daily, this has to come from a specialty pharmacy, [facility] they will contact you before sending you the medication.  If you can not afford the co-pay please let them know at that time and they will work with you for patient assistance.  We will contact you in 6 months to schedule your next appointment.

## 2016-07-29 NOTE — Progress Notes (Addendum)
Patient ID: [name] [name], male   DOB: December 08, 1960, 55 y.o.   MRN: [id number]  ADVANCED HF CLINIC NOTE  Patient ID: [name] [name], male   DOB: 1960/11/22, 55 y.o.   MRN: [id number] PCP: Primary Cardiologist:  HPI:  [name] [name] is a 55 y.o. [place] male with PMH of urolithiasis, HTN, chronic empyema s/p remote lung decortication and PAH.  He was admitted to [facility] in 09/2015 for empyema, underwent a VATs procedure and chest tube placement and PICC line placement for outpatient IV ABX. He was non compliant with Cardiothoracic surgery and other provider visits and continued to go to the ED for care, where his chest tube was eventually removed in 10/2015. He was readmitted in 3/17 with hemoptysis and recurrent empyema. Chest tube placed. Culture + methicillin resistant CN Staph. Treated with vanc. Echo with evidence of PAH started on sildenafil.   Was seen in office 8/17. Feeling better but off sildenafil. Underwent repeat RHC with persistent PAH. Sildenafil restarted. Here for f/u. Saw Dr. Servando Snare last month. CXR stable. Has also seen Dr. Melvyn Novas in Pulmonary. Stressed need for compliance with O2. Working full time. Still SOB with moderate exertion. Only wearing O2 at night. Has not filled sildenafil. No hemoptysis. Dry cough.   RHC 8/17 (off sildenafil) RA = 3 RV = 59/6 PA = 62/23 (40) PCW = 6 Fick cardiac output/index = 4.2/2.9 PVR = 8.1 WU Ao sat = 93%  PA sat = 67%, 68%  RHC on 01/18/16 .  RA = 4 RV = 54/3/7 PA = 59/24 (37) PCW = 9 Fick cardiac output/index = 4.1/3.2 PVR = 6.8 WU FA sat = 95% PA sat = 68%, 68%  PFTs 02/16/16 FVC 1.63 (48%) FEV1 0.75 (28%) FEV1/FVC ratio 46% TLC 2.88 (58%) DLCO 10.3 (54%)   Had echo 7/17. LVEF 55-60% MildlyD-shaped septum. RV dilated with normal function. No TR jet to measure PA pressures.    Echo 01/14/16 LVEF 0000000, septal diastolic and systolic flattening. Trivial MR, Mildly dilated RV with mild/mod reduced RV function. Mild TR, PA peak pressure 81 mm Hg.   Echo 7/17. LVEF 55-60% MildlyD-shaped septum. RV dilated with mild HK. No TR jet to measure PA pressures.   ROS: All systems negative except as listed in HPI, PMH and Problem List.  SH:  Social History   Social History  . Marital status: Single    Spouse name: N/A  . Number of children: N/A  . Years of education: N/A   Occupational History  . Not on file.   Social History Main Topics  . Smoking status: Never Smoker  . Smokeless tobacco: Not on file  . Alcohol use Yes  . Drug use: No  . Sexual activity: Not on file   Other Topics Concern  . Not on file   Social History Narrative  . No narrative on file    FH: No family history on file.  Past Medical History:  Diagnosis Date  . Hypertension   . Kidney stones     Current Outpatient Prescriptions  Medication Sig Dispense Refill  . OXYGEN 4lpm 24/7    . umeclidinium-vilanterol (ANORO ELLIPTA) 62.5-25 MCG/INH AEPB Inhale 2 puffs into the lungs once. Only open the device one time and then take your two separate drags to be sure you get it all (Patient taking differently: Inhale 2 puffs into the lungs at bedtime. Only open the device one time and then take your two separate drags to be sure you get it all) 1 each  11   No current facility-administered medications for this encounter.     There were no vitals filed for this visit. Wt Readings from Last 3 Encounters:  06/24/16 110 lb (49.9 kg)  06/20/16 97 lb (44 kg)  06/17/16 97 lb (44 kg)     PHYSICAL EXAM:  General:  Well appearing. No resp difficulty, very thin HEENT: normal Neck: supple. JVP flat. Carotids 2+ bilaterally; no bruits. No thyromegaly or nodule noted.  Cor: PMI normal. RRR. No M/G/R. Normal S1, Prominent S2, minimal TR Lungs: clear diminished at bases  Abdomen: soft, non-tender, non-distended, no HSM. No bruits or masses. +BS  Extremities: no cyanosis, rash, or edema. + Clubbing Neuro: alert & orientedx3, cranial nerves grossly intact. Moves all 4  extremities w/o difficulty. Affect pleasant.  ASSESSMENT & PLAN: 1. Pulmonary HTN with cor pulmonale - Think primarily Group 3 with likely component of Group 1  - PFTs with DLCO 54%, - Echo 7/17 with persistent RV strain. [place] on 8/17 with mild to moderate PAH.  - ANA negative, ANCA negative, RF negative. Recent HIV/HCV test negative - VQ scan low probability. - LE dopplers negative for DVT.  - Suspect mostly WHO Group 3 disease but did improve with sildenafil in past however was unable to comply with TID regimen. Will switch to adcirca 20 daily - Hall walk done personally today sats 95%->89%. Reminded to use O2 with exertion.  2. Recurrent left-sided empyema s/p drainage - Followed by Dr Servando Snare - CXR 8/17 stable.   3. COPD - Severe by PFTs 02/16/16 FVC 1.63 (48%), FEV1 0.75 (28%), FEV1/FVC ratio 46%, TLC 2.88 (58%),  DLCO 10.3 (54%) - Has seen Dr. Melvyn Novas. Non-compliant with O2 - Stressed need to use O2  Lynnae Ludemann,MD 8:40 AM

## 2016-08-01 NOTE — Addendum Note (Signed)
Encounter addended by: Scarlette Calico, RN on: 08/01/2016 12:59 PM<BR>    Actions taken: Order Entry activity accessed, Diagnosis association updated

## 2016-08-06 ENCOUNTER — Other Ambulatory Visit (HOSPITAL_COMMUNITY): Payer: Self-pay | Admitting: Pharmacist

## 2016-08-06 MED ORDER — TADALAFIL (PAH) 20 MG PO TABS: 20.0000 mg | ORAL_TABLET | Freq: Every day | ORAL | 11 refills | Status: DC

## 2016-08-12 ENCOUNTER — Encounter (HOSPITAL_COMMUNITY): Payer: Self-pay | Admitting: Pharmacist

## 2016-08-19 NOTE — Addendum Note (Signed)
Encounter addended by: Jolaine Artist, MD on: 08/19/2016 11:08 PM<BR>    Actions taken: Sign clinical note

## 2016-08-26 ENCOUNTER — Telehealth (HOSPITAL_COMMUNITY): Payer: Self-pay

## 2016-08-26 NOTE — Telephone Encounter (Signed)
[facility] called CHF clinic triage line to request med list to be faxed to [phone]. Current med list faxed as requested.  Renee Pain, RN

## 2016-08-27 ENCOUNTER — Telehealth (HOSPITAL_COMMUNITY): Payer: Self-pay | Admitting: Pharmacist

## 2016-08-27 NOTE — Telephone Encounter (Signed)
Adcirca PA approved by [facility] through 08/23/17.   [name], PharmD, BCPS, CPP Clinical Pharmacist Pager: [phone] Phone: [phone] 08/27/2016 3:01 PM

## 2016-11-05 ENCOUNTER — Emergency Department (HOSPITAL_COMMUNITY): Payer: BLUE CROSS/BLUE SHIELD

## 2016-11-05 ENCOUNTER — Encounter (HOSPITAL_COMMUNITY): Payer: Self-pay

## 2016-11-05 ENCOUNTER — Inpatient Hospital Stay (HOSPITAL_COMMUNITY)
Admission: EM | Admit: 2016-11-05 | Discharge: 2016-11-09 | DRG: 190 | Disposition: A | Discharge status: Home / Self Care | Payer: BLUE CROSS/BLUE SHIELD | Attending: Internal Medicine | Admitting: Internal Medicine

## 2016-11-05 DIAGNOSIS — I2721 Secondary pulmonary arterial hypertension: Secondary | ICD-10-CM | POA: Diagnosis present

## 2016-11-05 DIAGNOSIS — Z79899 Other long term (current) drug therapy: Secondary | ICD-10-CM

## 2016-11-05 DIAGNOSIS — R0602 Shortness of breath: Secondary | ICD-10-CM

## 2016-11-05 DIAGNOSIS — Z8249 Family history of ischemic heart disease and other diseases of the circulatory system: Secondary | ICD-10-CM

## 2016-11-05 DIAGNOSIS — R079 Chest pain, unspecified: Secondary | ICD-10-CM | POA: Diagnosis present

## 2016-11-05 DIAGNOSIS — I10 Essential (primary) hypertension: Secondary | ICD-10-CM | POA: Diagnosis present

## 2016-11-05 DIAGNOSIS — J440 Chronic obstructive pulmonary disease with acute lower respiratory infection: Principal | ICD-10-CM | POA: Diagnosis present

## 2016-11-05 DIAGNOSIS — J90 Pleural effusion, not elsewhere classified: Secondary | ICD-10-CM | POA: Diagnosis present

## 2016-11-05 DIAGNOSIS — J189 Pneumonia, unspecified organism: Secondary | ICD-10-CM | POA: Diagnosis present

## 2016-11-05 DIAGNOSIS — I272 Pulmonary hypertension, unspecified: Secondary | ICD-10-CM | POA: Diagnosis present

## 2016-11-05 DIAGNOSIS — J449 Chronic obstructive pulmonary disease, unspecified: Secondary | ICD-10-CM | POA: Diagnosis present

## 2016-11-05 DIAGNOSIS — I471 Supraventricular tachycardia: Secondary | ICD-10-CM | POA: Diagnosis present

## 2016-11-05 DIAGNOSIS — Z87442 Personal history of urinary calculi: Secondary | ICD-10-CM

## 2016-11-05 DIAGNOSIS — I248 Other forms of acute ischemic heart disease: Secondary | ICD-10-CM | POA: Diagnosis present

## 2016-11-05 DIAGNOSIS — J869 Pyothorax without fistula: Secondary | ICD-10-CM | POA: Diagnosis present

## 2016-11-05 DIAGNOSIS — Z9119 Patient's noncompliance with other medical treatment and regimen: Secondary | ICD-10-CM

## 2016-11-05 DIAGNOSIS — Z9889 Other specified postprocedural states: Secondary | ICD-10-CM

## 2016-11-05 DIAGNOSIS — Z7982 Long term (current) use of aspirin: Secondary | ICD-10-CM

## 2016-11-05 HISTORY — DX: Pulmonary hypertension, unspecified: I27.20

## 2016-11-05 LAB — COMPREHENSIVE METABOLIC PANEL WITH GFR
ALT: 22 U/L (ref 17–63)
AST: 31 U/L (ref 15–41)
Albumin: 2.3 g/dL — ABNORMAL LOW (ref 3.5–5.0)
Alkaline Phosphatase: 77 U/L (ref 38–126)
Anion gap: 10 (ref 5–15)
BUN: 12 mg/dL (ref 6–20)
CO2: 26 mmol/L (ref 22–32)
Calcium: 8.4 mg/dL — ABNORMAL LOW (ref 8.9–10.3)
Chloride: 98 mmol/L — ABNORMAL LOW (ref 101–111)
Creatinine, Ser: 0.91 mg/dL (ref 0.61–1.24)
GFR calc Af Amer: >60 mL/min (ref >60)
GFR calc non Af Amer: >60 mL/min (ref >60)
Glucose, Bld: 122 mg/dL — ABNORMAL HIGH (ref 65–99)
Potassium: 4.3 mmol/L (ref 3.5–5.1)
Sodium: 134 mmol/L — ABNORMAL LOW (ref 135–145)
Total Bilirubin: 0.2 mg/dL — ABNORMAL LOW (ref 0.3–1.2)
Total Protein: 9 g/dL — ABNORMAL HIGH (ref 6.5–8.1)

## 2016-11-05 LAB — CBC WITH DIFFERENTIAL/PLATELET
Basophils Absolute: 0.1 K/uL (ref 0.0–0.1)
Basophils Relative: 1 %
Eosinophils Absolute: 0.2 K/uL (ref 0.0–0.7)
Eosinophils Relative: 4 %
HCT: 40.2 % (ref 39.0–52.0)
Hemoglobin: 13.6 g/dL (ref 13.0–17.0)
Lymphocytes Relative: 33 %
Lymphs Abs: 1.9 K/uL (ref 0.7–4.0)
MCH: 28.3 pg (ref 26.0–34.0)
MCHC: 33.8 g/dL (ref 30.0–36.0)
MCV: 83.6 fL (ref 78.0–100.0)
Monocytes Absolute: 1 K/uL (ref 0.1–1.0)
Monocytes Relative: 18 %
Neutro Abs: 2.4 K/uL (ref 1.7–7.7)
Neutrophils Relative %: 44 %
Platelets: 300 K/uL (ref 150–400)
RBC: 4.81 MIL/uL (ref 4.22–5.81)
RDW: 14.1 % (ref 11.5–15.5)
WBC: 5.6 K/uL (ref 4.0–10.5)

## 2016-11-05 LAB — BRAIN NATRIURETIC PEPTIDE: B Natriuretic Peptide: 106.1 pg/mL — ABNORMAL HIGH (ref 0.0–100.0)

## 2016-11-05 LAB — I-STAT TROPONIN, ED: TROPONIN I, POC: 0 ng/mL (ref 0.00–0.08)

## 2016-11-05 LAB — I-STAT CG4 LACTIC ACID, ED: Lactic Acid, Venous: 1.33 mmol/L (ref 0.5–1.9)

## 2016-11-05 MED ORDER — ASPIRIN 325 MG PO TABS: 325.0000 mg | ORAL_TABLET | Freq: Every day | ORAL | Status: DC

## 2016-11-05 NOTE — ED Notes (Signed)
Patient is resting comfortably. 

## 2016-11-05 NOTE — ED Triage Notes (Signed)
Onset 2 days productive cough- white and yellow phlegm and shortness of breath.  Pt was sent to ED by PCP.  NAD at triage.  Talking in complete sentences.

## 2016-11-05 NOTE — ED Provider Notes (Signed)
[name] [name] Provider Note   CSN: [id number] Arrival date & time: 11/05/16  1455     History   Chief Complaint Chief Complaint  Patient presents with  . Cough  . Shortness of Breath    HPI [name] [name] is a 56 y.o. male.  The history is provided by the patient and medical records. No language interpreter was used.     56 year old male the past medical history pulmonary hypertension, hypertension, recurrent empyema who presents today with chest pain and shortness of breath. Chest pain started in the last day or so describes it as sharp. He has chest pain at rest. Also endorses one week of dyspnea on exertion that he states is worsening as well. Denies any fevers or chills but has had a cough that is productive of yellow sputum. Has not had any nausea, popping, diarrhea, abdominal pain. Denies any increased swelling in his lower 70s bilaterally. No further worsening alleviating factors.  Past Medical History:  Diagnosis Date  . Hypertension   . Kidney stones   . Pulmonary hypertension     Patient Active Problem List   Diagnosis Date Noted  . Chronic respiratory failure with hypoxia ([place]) 03/26/2016  . COPD GOLD IV criteria but never smoked  03/11/2016  . Pulmonary HTN   . Sepsis ([place])   . Hyperkalemia   . SIRS (systemic inflammatory response syndrome) ([place]) 01/11/2016  . Chest tube in place   . Empyema, left ([place]) 10/02/2015  . Diarrhea 09/27/2015  . Chest pain 09/27/2015  . Unintentional weight loss 09/27/2015  . Abdominal cramps 09/26/2015  . Pleural effusion, left 09/26/2015    Past Surgical History:  Procedure Laterality Date  . CARDIAC CATHETERIZATION N/A 01/18/2016   Procedure: Right Heart Cath;  Surgeon: Jolaine Artist, MD;  Location: [facility];  Service: Cardiovascular;  Laterality: N/A;  . CARDIAC CATHETERIZATION N/A 06/24/2016   Procedure: Right Heart Cath;  Surgeon: Jolaine Artist, MD;  Location: [facility];  Service:  Cardiovascular;  Laterality: N/A;  . KIDNEY SURGERY    . VIDEO ASSISTED THORACOSCOPY (VATS)/DECORTICATION Left 2003  . VIDEO ASSISTED THORACOSCOPY (VATS)/DECORTICATION Left 10/02/2015   Procedure: VIDEO ASSISTED THORACOSCOPY (VATS)/DECORTICATION and drainage of chronic empyena;  Surgeon: [name], MD;  Location: [facility];  Service: Thoracic;  Laterality: Left;  Marland Kitchen VIDEO BRONCHOSCOPY N/A 10/02/2015   Procedure: VIDEO BRONCHOSCOPY;  Surgeon: [name], MD;  Location: [facility];  Service: Thoracic;  Laterality: N/A;       Home Medications    Prior to Admission medications   Medication Sig Start Date End Date Taking? Authorizing Provider  tadalafil, PAH, (ADCIRCA) 20 MG tablet Take 1 tablet (20 mg total) by mouth daily. 08/06/16  Yes Shaune Pascal Bensimhon, MD  umeclidinium-vilanterol (ANORO ELLIPTA) 62.5-25 MCG/INH AEPB Inhale 2 puffs into the lungs once. Only open the device one time and then take your two separate drags to be sure you get it all Patient taking differently: Inhale 2 puffs into the lungs at bedtime. Only open the device one time and then take your two separate drags to be sure you get it all 04/23/16  Yes Tanda Rockers, MD  OXYGEN 4lpm 24/7    Historical Provider, MD    Family History History reviewed. No pertinent family history.  Social History Social History  Substance Use Topics  . Smoking status: Never Smoker  . Smokeless tobacco: Never Used  . Alcohol use Yes     Allergies  Patient has no known allergies.   Review of Systems Review of Systems  Constitutional: Negative for chills and fever.  HENT: Negative for congestion and rhinorrhea.   Respiratory: Positive for cough and shortness of breath (worsens with exertion).   Cardiovascular: Positive for chest pain (sharp, center of chest).  Gastrointestinal: Negative for abdominal pain, nausea and vomiting.  Genitourinary: Negative for dysuria and hematuria.  Skin: Negative for pallor and rash.    Neurological: Negative for weakness and numbness.  Psychiatric/Behavioral: Negative for agitation and confusion.  All other systems reviewed and are negative.    Physical Exam Updated Vital Signs BP 117/92   Pulse 89   Temp 98.5 F (36.9 C) (Oral)   Resp 23   SpO2 94%   Physical Exam  Constitutional: He is oriented to person, place, and time. No distress.  HENT:  Head: Normocephalic and atraumatic.  Eyes: Conjunctivae and EOM are normal.  Neck: Normal range of motion. Neck supple. JVD present.  Cardiovascular: Normal rate and regular rhythm.   Pulmonary/Chest: Effort normal. He has wheezes (diffuse end expiratory wheezing).  Abdominal: Soft. He exhibits no distension. There is no tenderness.  Musculoskeletal: Normal range of motion. He exhibits no edema.  Neurological: He is alert and oriented to person, place, and time.  Skin: Skin is warm and dry. He is not diaphoretic.     ED Treatments / Results  Labs (all labs ordered are listed, but only abnormal results are displayed) Labs Reviewed  COMPREHENSIVE METABOLIC PANEL - Abnormal; Notable for the following:       Result Value   Sodium 134 (*)    Chloride 98 (*)    Glucose, Bld 122 (*)    Calcium 8.4 (*)    Total Protein 9.0 (*)    Albumin 2.3 (*)    Total Bilirubin 0.2 (*)    All other components within normal limits  BRAIN NATRIURETIC PEPTIDE - Abnormal; Notable for the following:    B Natriuretic Peptide 106.1 (*)    All other components within normal limits  CBC WITH DIFFERENTIAL/PLATELET  I-STAT CG4 LACTIC ACID, ED  I-STAT TROPOININ, ED  I-STAT CG4 LACTIC ACID, ED    EKG  EKG Interpretation  Date/Time:  Tuesday November 05 2016 20:59:32 EST Ventricular Rate:  84 PR Interval:    QRS Duration: 78 QT Interval:  381 QTC Calculation: 451 R Axis:   100 Text Interpretation:  Sinus rhythm Right axis deviation Consider left ventricular hypertrophy Abnormal T, consider ischemia, anterior leads New since  previous tracing Confirmed by KNOTT MD, DANIEL [phone]) on 11/05/2016 9:09:16 PM       Radiology Dg Chest Portable 1 View  Result Date: 11/05/2016 CLINICAL DATA:  Two weeks duration of productive cough and dyspnea. Midchest pain tonight. EXAM: PORTABLE CHEST 1 VIEW COMPARISON:  06/20/2016, 03/14/2016. FINDINGS: Loculated left pleural collection persists although the pleural air component is reduced or resolved. Pleural calcifications persist bilaterally. No new areas of parenchymal lung opacity. Persistent mild opacities adjacent to the pleural collection. Prominent pulmonary arterial contours are unchanged. Hilar and mediastinal contours are otherwise unremarkable. IMPRESSION: No acute findings. There is unchanged chronic pleural collection on the left and chronic pleural calcifications bilaterally. Stable chronic enlargement of the pulmonary arteries consistent with pulmonary arterial hypertension. Electronically Signed   By: Andreas Newport M.D.   On: 11/05/2016 22:01    Procedures Procedures (including critical care time)  Medications Ordered in ED Medications  aspirin tablet 325 mg (not administered)  Initial Impression / Assessment and Plan / ED Course  I have reviewed the triage vital signs and the nursing notes.  Pertinent labs & imaging results that were available during my care of the patient were reviewed by me and considered in my medical decision making (see chart for details).  Clinical Course     Patient presented today for chest pain starting in the last few days. Describes it as sharp and worsens with exertion. Has EKG TWI in lateral leads that were not present on prior. Initial troponin is negative. Given ASA while in ED.   Patient has a known history of pulmonary hypertension and empyema. No infectious signs or symptoms are present today. CXR appears stable compared to prior.   Given age, history, presentation I discussed with hospitalist who is agreeable to admit  the patient for further evaluation and management. Patient is stable at the time of admission.   Final Clinical Impressions(s) / ED Diagnoses   Final diagnoses:  Chest pain, unspecified type    New Prescriptions New Prescriptions   No medications on file     Theodosia Quay, MD 11/05/16 [place], MD 11/06/16 [phone]

## 2016-11-05 NOTE — ED Notes (Signed)
Admitting at bedside 

## 2016-11-06 ENCOUNTER — Observation Stay (HOSPITAL_COMMUNITY): Payer: BLUE CROSS/BLUE SHIELD

## 2016-11-06 ENCOUNTER — Encounter (HOSPITAL_COMMUNITY): Payer: Self-pay | Admitting: Internal Medicine

## 2016-11-06 ENCOUNTER — Observation Stay (HOSPITAL_BASED_OUTPATIENT_CLINIC_OR_DEPARTMENT_OTHER): Payer: BLUE CROSS/BLUE SHIELD

## 2016-11-06 DIAGNOSIS — J449 Chronic obstructive pulmonary disease, unspecified: Secondary | ICD-10-CM | POA: Diagnosis not present

## 2016-11-06 DIAGNOSIS — I272 Pulmonary hypertension, unspecified: Secondary | ICD-10-CM | POA: Diagnosis not present

## 2016-11-06 DIAGNOSIS — R079 Chest pain, unspecified: Secondary | ICD-10-CM

## 2016-11-06 DIAGNOSIS — J189 Pneumonia, unspecified organism: Secondary | ICD-10-CM

## 2016-11-06 DIAGNOSIS — R0789 Other chest pain: Secondary | ICD-10-CM | POA: Diagnosis not present

## 2016-11-06 LAB — EXPECTORATED SPUTUM ASSESSMENT W GRAM STAIN, RFLX TO RESP C

## 2016-11-06 LAB — I-STAT TROPONIN, ED: TROPONIN I, POC: 0 ng/mL (ref 0.00–0.08)

## 2016-11-06 LAB — ECHOCARDIOGRAM COMPLETE
HEIGHTINCHES: 60 in
Weight: 1456 oz

## 2016-11-06 LAB — TROPONIN I
TROPONIN I: 0.03 ng/mL — AB (ref <0.03)
Troponin I: 0.03 ng/mL (ref <0.03)

## 2016-11-06 LAB — STREP PNEUMONIAE URINARY ANTIGEN: STREP PNEUMO URINARY ANTIGEN: NEGATIVE

## 2016-11-06 LAB — HIV ANTIBODY (ROUTINE TESTING W REFLEX): HIV Screen 4th Generation wRfx: NONREACTIVE

## 2016-11-06 LAB — EXPECTORATED SPUTUM ASSESSMENT W REFEX TO RESP CULTURE

## 2016-11-06 LAB — I-STAT CG4 LACTIC ACID, ED: LACTIC ACID, VENOUS: 1.17 mmol/L (ref 0.5–1.9)

## 2016-11-06 MED ORDER — ACETAMINOPHEN 325 MG PO TABS: 650.0000 mg | ORAL_TABLET | ORAL | Status: DC | PRN

## 2016-11-06 MED ORDER — ALUM & MAG HYDROXIDE-SIMETH 200-200-20 MG/5ML PO SUSP
15.0000 mL | Freq: Four times a day (QID) | ORAL | Status: DC | PRN
  Administered 2016-11-06: 15 mL via ORAL
  Filled 2016-11-06: qty 30

## 2016-11-06 MED ORDER — IOPAMIDOL (ISOVUE-370) INJECTION 76%
INTRAVENOUS | Status: AC
  Administered 2016-11-06: 100 mL via INTRAVENOUS
  Filled 2016-11-06: qty 100

## 2016-11-06 MED ORDER — DEXTROSE 5 % IV SOLN
1.0000 g | Freq: Once | INTRAVENOUS | Status: AC
  Administered 2016-11-06: 1 g via INTRAVENOUS
  Filled 2016-11-06: qty 10

## 2016-11-06 MED ORDER — DEXTROSE 5 % IV SOLN
500.0000 mg | INTRAVENOUS | Status: DC
  Administered 2016-11-06 – 2016-11-07 (×2): 500 mg via INTRAVENOUS
  Filled 2016-11-06 (×2): qty 500

## 2016-11-06 MED ORDER — UMECLIDINIUM BROMIDE 62.5 MCG/INH IN AEPB
1.0000 | INHALATION_SPRAY | Freq: Every day | RESPIRATORY_TRACT | Status: DC
  Administered 2016-11-06 – 2016-11-08 (×3): 1 via RESPIRATORY_TRACT
  Filled 2016-11-06: qty 7

## 2016-11-06 MED ORDER — UMECLIDINIUM-VILANTEROL 62.5-25 MCG/INH IN AEPB: 2.0000 | INHALATION_SPRAY | Freq: Every day | RESPIRATORY_TRACT | Status: DC

## 2016-11-06 MED ORDER — ASPIRIN EC 325 MG PO TBEC
325.0000 mg | DELAYED_RELEASE_TABLET | Freq: Every day | ORAL | Status: DC
  Administered 2016-11-06 – 2016-11-09 (×4): 325 mg via ORAL
  Filled 2016-11-06 (×3): qty 1

## 2016-11-06 MED ORDER — DEXTROSE 5 % IV SOLN
1.0000 g | INTRAVENOUS | Status: DC
  Administered 2016-11-07 – 2016-11-09 (×3): 1 g via INTRAVENOUS
  Filled 2016-11-06 (×3): qty 10

## 2016-11-06 MED ORDER — GUAIFENESIN-DM 100-10 MG/5ML PO SYRP
5.0000 mL | ORAL_SOLUTION | ORAL | Status: DC | PRN
  Administered 2016-11-06 – 2016-11-08 (×4): 5 mL via ORAL
  Filled 2016-11-06 (×4): qty 5

## 2016-11-06 MED ORDER — ARFORMOTEROL TARTRATE 15 MCG/2ML IN NEBU
15.0000 ug | INHALATION_SOLUTION | Freq: Two times a day (BID) | RESPIRATORY_TRACT | Status: DC
  Administered 2016-11-06 – 2016-11-08 (×5): 15 ug via RESPIRATORY_TRACT
  Filled 2016-11-06 (×6): qty 2

## 2016-11-06 MED ORDER — ONDANSETRON HCL 4 MG/2ML IJ SOLN: 4.0000 mg | Freq: Four times a day (QID) | INTRAMUSCULAR | Status: DC | PRN

## 2016-11-06 NOTE — Consult Note (Signed)
CARDIOLOGY CONSULT NOTE   Patient ID: [name] [name] MRN: [id number] DOB/AGE: [birth year] 56 y.o.  Admit date: 11/05/2016  Primary Physician   No PCP Per Patient Primary Cardiologist   Dr. Haroldine Laws Reason for Consultation   Chest pain Requesting Physician  Dr. Eliseo Squires  HPI: [name] [name] is a 56 y.o. male with a history of HTN, chronic empyema s/p remote lung decortication and PAH who presents with chest pain.   He was admitted to [facility] in 09/2015 for empyema, underwent a VATs procedure and chest tube placement and PICC line placement for outpatient IV ABX. He was non compliant with Cardiothoracic surgery and other provider visits and continued to go to the ED for care, where his chest tube was eventually removed in 10/2015. He was readmitted in 3/17 with hemoptysis and recurrent empyema. Chest tube placed. Culture + methicillin resistant CN Staph. Treated with vanc. Echo with evidence of PAH started on sildenafil.   Was seen in office 8/17. Feeling better but off sildenafil. Underwent repeat RHC with persistent PAH. Sildenafil restarted.  Last seen in office 07/29/16. Sildenafil changed to Adcirca 20mg  qd as he was unable to compliance with TID regimen.   Today he states the never started Adcirca as he does not know if its approved or not. However, per telephone note 08/27/16 it has been approved.  Presented to the ER yesterday with 2 days history of chest pain and shortness of breath. His pain starts around epigastric area and radiates to upper mid chest. Described as sharp in sensation. Its been constant and associated with shortness of breath. His dyspnea  worsened with exertion. Denies fever or chills but admits to having cough with yellow phlegm. Patient denies any lower extremity edema, orthopnea, PND, syncope, palpitation, melena or blood in his stool or urine.  Upon arrival to ER he was chest pain-free. Chest x-ray shows chronic lung changes with pleural effusion on the left side. EKG shows normal  sinus rhythm with T-wave inversion in anterior lead which appears new however has a LVH and repolarization. Troponin negative x 1. Point-of-care troponin negative x 2. BNP 106.   CT angiography chest as described below IMPRESSION: 1. Negative for acute pulmonary embolism. 2. New patchy airspace consolidation in the right lower lobe and in the right middle lobe base. This could represent pneumonia. 3. Chronic pulmonary arterial enlargement consistent with pulmonary arterial hypertension, unchanged. 4. Chronic left hemithorax pleural collection with air and fluid components, slightly reduced in size with resolution of the upper hemithorax air-fluid level since 12/14/2015.   Past Medical History:  Diagnosis Date  . Hypertension   . Kidney stones   . Pulmonary hypertension      Past Surgical History:  Procedure Laterality Date  . CARDIAC CATHETERIZATION N/A 01/18/2016   Procedure: Right Heart Cath;  Surgeon: Jolaine Artist, MD;  Location: [place] [facility];  Service: Cardiovascular;  Laterality: N/A;  . CARDIAC CATHETERIZATION N/A 06/24/2016   Procedure: Right Heart Cath;  Surgeon: Jolaine Artist, MD;  Location: [place] [facility];  Service: Cardiovascular;  Laterality: N/A;  . KIDNEY SURGERY    . VIDEO ASSISTED THORACOSCOPY (VATS)/DECORTICATION Left 2003  . VIDEO ASSISTED THORACOSCOPY (VATS)/DECORTICATION Left 10/02/2015   Procedure: VIDEO ASSISTED THORACOSCOPY (VATS)/DECORTICATION and drainage of chronic empyena;  Surgeon: [name], MD;  Location: [place];  Service: Thoracic;  Laterality: Left;  Marland Kitchen VIDEO BRONCHOSCOPY N/A 10/02/2015   Procedure: VIDEO BRONCHOSCOPY;  Surgeon: [name], MD;  Location: [facility];  Service:  Thoracic;  Laterality: N/A;    No Known Allergies  I have reviewed the patient's current medications . arformoterol  15 mcg Nebulization BID  . aspirin EC  325 mg Oral Daily  . azithromycin  500 mg Intravenous Q24H  . [START ON 11/07/2016]  cefTRIAXone (ROCEPHIN)  IV  1 g Intravenous Q24H  . umeclidinium bromide  1 puff Inhalation Daily    acetaminophen, ondansetron (ZOFRAN) IV  Prior to Admission medications   Medication Sig Start Date End Date Taking? Authorizing Provider  tadalafil, PAH, (ADCIRCA) 20 MG tablet Take 1 tablet (20 mg total) by mouth daily. 08/06/16  Yes Shaune Pascal Bensimhon, MD  umeclidinium-vilanterol (ANORO ELLIPTA) 62.5-25 MCG/INH AEPB Inhale 2 puffs into the lungs once. Only open the device one time and then take your two separate drags to be sure you get it all Patient taking differently: Inhale 2 puffs into the lungs at bedtime. Only open the device one time and then take your two separate drags to be sure you get it all 04/23/16  Yes Tanda Rockers, MD  OXYGEN 4lpm 24/7    Historical Provider, MD     Social History   Social History  . Marital status: Single    Spouse name: N/A  . Number of children: N/A  . Years of education: N/A   Occupational History  . Not on file.   Social History Main Topics  . Smoking status: Never Smoker  . Smokeless tobacco: Never Used  . Alcohol use Yes  . Drug use: No  . Sexual activity: Not on file   Other Topics Concern  . Not on file   Social History Narrative  . No narrative on file    Family Status  Relation Status  . Other    Family History  Problem Relation Age of Onset  . Hypertension Other      ROS:  Full 14 point review of systems complete and found to be negative unless listed above.  Physical Exam: Blood pressure 110/78, pulse 81, temperature 97.9 F (36.6 C), temperature source Oral, resp. rate 18, height 5' (1.524 m), weight 91 lb (41.3 kg), SpO2 96 %.  General: thin frail male in no acute distress Head: Eyes PERRLA, No xanthomas. Normocephalic and atraumatic, oropharynx without edema or exudate.  Lungs: Resp regular and unlabored, CTA. Heart: RRR no s3, s4, or murmurs..   Neck: No carotid bruits. No lymphadenopathy. No  JVD. Abdomen:  Bowel sounds present, abdomen soft and non-tender without masses or hernias noted. Msk:  No spine or cva tenderness. No weakness, no joint deformities or effusions. Extremities: No clubbing, cyanosis or edema. DP/PT/Radials 2+ and equal bilaterally. Neuro: Alert and oriented X 3. No focal deficits noted. Psych:  Good affect, responds appropriately Skin: No rashes or lesions noted.  Labs:   Lab Results  Component Value Date   WBC 5.6 11/05/2016   HGB 13.6 11/05/2016   HCT 40.2 11/05/2016   MCV 83.6 11/05/2016   PLT 300 11/05/2016   No results for input(s): INR in the last 72 hours.  Recent Labs Lab 11/05/16 2108  NA 134*  K 4.3  CL 98*  CO2 26  BUN 12  CREATININE 0.91  CALCIUM 8.4*  PROT 9.0*  BILITOT 0.2*  ALKPHOS 77  ALT 22  AST 31  GLUCOSE 122*  ALBUMIN 2.3*   Magnesium  Date Value Ref Range Status  01/11/2016 2.0 1.7 - 2.4 mg/dL Final    Recent Labs  11/06/16 0137  TROPONINI <0.03    Recent Labs  11/05/16 2134 11/06/16 0000  TROPIPOC 0.00 0.00   No results found for: PROBNP No results found for: CHOL, HDL, LDLCALC, TRIG No results found for: DDIMER Lipase  Date/Time Value Ref Range Status  09/25/2015 09:06 PM 37 11 - 51 U/L Final     Right Heart Cath 06/24/16  Conclusion   Findings:  RA = 3 RV = 59/6 PA = 62/23 (40) PCW = 6 Fick cardiac output/index = 4.2/2.9 PVR = 8.1 WU Ao sat = 93%  PA sat = 67%, 68%  Assessment:  1. Moderate PAH with normal left sided pressures and cardiac output  Plan/Discussion:  Restart sildenafil.    Echo 05/30/16 LV EF: 55% -   60%  ------------------------------------------------------------------- Indications:      Pulmonary hypertension - primary 416.0.  ------------------------------------------------------------------- History:   Risk factors:  Hypertension.  ------------------------------------------------------------------- Study Conclusions  - Left ventricle: The cavity size was  normal. Wall thickness was   normal. Systolic function was normal. The estimated ejection   fraction was in the range of 55% to 60%. Wall motion was normal;   there were no regional wall motion abnormalities. Doppler   parameters are consistent with abnormal left ventricular   relaxation (grade 1 diastolic dysfunction). - Ventricular septum: D-shaped interventricular septum suggesting   RV pressure/volume overload. - Aortic valve: There was no stenosis. - Mitral valve: There was no significant regurgitation. - Right ventricle: The cavity size was mildly to moderately   dilated. Systolic function was normal. - Pulmonary arteries: No complete TR doppler jet so unable to   estimate PA systolic pressure. - Systemic veins: IVC was not visualized.  Impressions:  - Normal LV size with EF 55-60%. D-shaped interventricular septum   suggesting RV pressure/volume overload. No complete TR doppler   jet measured so unable to estimate PA systolic pressure. Mild to   moderate RV dilation with normal systolic function.  Radiology:  Ct Angio Chest Pe W Or Wo Contrast  Result Date: 11/06/2016 CLINICAL DATA:  Dyspnea tonight EXAM: CT ANGIOGRAPHY CHEST WITH CONTRAST TECHNIQUE: Multidetector CT imaging of the chest was performed using the standard protocol during bolus administration of intravenous contrast. Multiplanar CT image reconstructions and MIPs were obtained to evaluate the vascular anatomy. CONTRAST:  100 mL Isovue 370 intravenous COMPARISON:  12/14/2015 FINDINGS: Cardiovascular: Satisfactory opacification of the pulmonary arteries to the segmental level. No evidence of pulmonary embolism. Stable chronic enlargement of the pulmonary arteries consistent with pulmonary arterial hypertension. The thoracic aorta is normal in caliber and intact. No pericardial effusion. Mediastinum/Nodes: No enlarged mediastinal, hilar, or axillary lymph nodes. Thyroid gland, trachea, and esophagus demonstrate no  significant findings. Lungs/Pleura: New patchy airspace opacities in the right lower lobe and in the right middle lobe base. This may represent pneumonia. Persistent chronic pleural air and flu collection on the left, with resolution of the air-fluid level in the upper left hemithorax. Upper Abdomen: No acute abnormality. Musculoskeletal: No significant skeletal lesion. Review of the MIP images confirms the above findings. IMPRESSION: 1. Negative for acute pulmonary embolism. 2. New patchy airspace consolidation in the right lower lobe and in the right middle lobe base. This could represent pneumonia. 3. Chronic pulmonary arterial enlargement consistent with pulmonary arterial hypertension, unchanged. 4. Chronic left hemithorax pleural collection with air and fluid components, slightly reduced in size with resolution of the upper hemithorax air-fluid level since 12/14/2015. Electronically Signed   By: Andreas Newport M.D.   On: 11/06/2016 03:12  Dg Chest Portable 1 View  Result Date: 11/05/2016 CLINICAL DATA:  Two weeks duration of productive cough and dyspnea. Midchest pain tonight. EXAM: PORTABLE CHEST 1 VIEW COMPARISON:  06/20/2016, 03/14/2016. FINDINGS: Loculated left pleural collection persists although the pleural air component is reduced or resolved. Pleural calcifications persist bilaterally. No new areas of parenchymal lung opacity. Persistent mild opacities adjacent to the pleural collection. Prominent pulmonary arterial contours are unchanged. Hilar and mediastinal contours are otherwise unremarkable. IMPRESSION: No acute findings. There is unchanged chronic pleural collection on the left and chronic pleural calcifications bilaterally. Stable chronic enlargement of the pulmonary arteries consistent with pulmonary arterial hypertension. Electronically Signed   By: Andreas Newport M.D.   On: 11/05/2016 22:01    ASSESSMENT AND PLAN:     1. Chest pain - Atypical. EKG with new anterior TWI in  setting of LVH. Troponin negative. CTA concerning for pneumonia. Started on abx.   2. pHTN - RHC on 8/17 with mild to moderate PAH. Never started on Adcirca 20mg  qd. F/u in CHF clinic.  3. COPD - Per primary  4. Recurrent left-sided empyema s/p drainage - Unchanged per CTA. Followed by Dr Servando Snare. However seems non-compliant.    SignedLeanor Kail, [place] 11/06/2016, 7:56 AM Pager [phone]  Personally seen and examined. Agree with above.  56 year old male with chronic empyema remote lung decortication, pulmonary hypertension with atypical chest pain with new T-wave inversion in V3, mildly elevated troponin, pneumonia on CT scan, negative for pulmonary embolism.   - Atypical chest pain, sharp, likely related to underlying pneumonia or previous lung injury. Troponin is mildly elevated 0.03, likely demand ischemia in the setting of his underlying pulmonary hypertension.  - No further cardiac testing at this time.  - Continue to encourage follow-up with heart failure clinic. Patient did not start pulmonary hypertension medication.  - Antibiotics per primary team.  - If chest pain returns, and appears more consistent with exertional angina, we can always proceed with left heart catheterization if necessary.  Please let us know if we can be of further assistance. We will sign off.  Candee Furbish, MD

## 2016-11-06 NOTE — Progress Notes (Signed)
Patient admitted after midnight, please see H&P.  Has follow up with Dr. Servando Snare next week.  Needs new PCP and medication compliance.  Continue IVF abx for PNA and will hope for d/c in AM once follow up arranged  Eulogio Bear DO

## 2016-11-06 NOTE — H&P (Signed)
History and Physical    [name] [name] [id number] DOB: [birth year] DOA: 11/05/2016  PCP: No PCP Per Patient  Patient coming from: Home.  Chief Complaint: Chest pain.  HPI: [name] [name] is a 56 y.o. male with history of recurrent empyema status post VATS with history of pulmonary hypertension and COPD presents to the ER because of chest pain. Patient has been having chest pain last 2 days which was retrosternal and nonradiating with some shortness of breath. Chest pain lasts a few minutes each time. Increases on exertion. Chest x-ray shows chronic lung changes with pleural effusion on the left side. EKG shows anterior T-wave changes concerning for ischemia. At the time of my exam patient is chest pain-free without any intervention. Patient will be admitted for further management of chest pain. Patient is afebrile, denies any productive cough.  Patient used to be on medications for pulmonary hypertension and COPD which patient states has not taken for last few months because of no refill.  ED Course:  As per the history of presenting illness.  Review of Systems: As per HPI, rest all negative.   Past Medical History:  Diagnosis Date  . Hypertension   . Kidney stones   . Pulmonary hypertension     Past Surgical History:  Procedure Laterality Date  . CARDIAC CATHETERIZATION N/A 01/18/2016   Procedure: Right Heart Cath;  Surgeon: Jolaine Artist, MD;  Location: [facility];  Service: Cardiovascular;  Laterality: N/A;  . CARDIAC CATHETERIZATION N/A 06/24/2016   Procedure: Right Heart Cath;  Surgeon: Jolaine Artist, MD;  Location: [facility];  Service: Cardiovascular;  Laterality: N/A;  . KIDNEY SURGERY    . VIDEO ASSISTED THORACOSCOPY (VATS)/DECORTICATION Left 2003  . VIDEO ASSISTED THORACOSCOPY (VATS)/DECORTICATION Left 10/02/2015   Procedure: VIDEO ASSISTED THORACOSCOPY (VATS)/DECORTICATION and drainage of chronic empyena;  Surgeon: [name], MD;  Location: [facility];   Service: Thoracic;  Laterality: Left;  Marland Kitchen VIDEO BRONCHOSCOPY N/A 10/02/2015   Procedure: VIDEO BRONCHOSCOPY;  Surgeon: [name], MD;  Location: [facility];  Service: Thoracic;  Laterality: N/A;     reports that he has never smoked. He has never used smokeless tobacco. He reports that he drinks alcohol. He reports that he does not use drugs.  No Known Allergies  Family History  Problem Relation Age of Onset  . Hypertension Other     Prior to Admission medications   Medication Sig Start Date End Date Taking? Authorizing Provider  tadalafil, PAH, (ADCIRCA) 20 MG tablet Take 1 tablet (20 mg total) by mouth daily. 08/06/16  Yes Shaune Pascal Bensimhon, MD  umeclidinium-vilanterol (ANORO ELLIPTA) 62.5-25 MCG/INH AEPB Inhale 2 puffs into the lungs once. Only open the device one time and then take your two separate drags to be sure you get it all Patient taking differently: Inhale 2 puffs into the lungs at bedtime. Only open the device one time and then take your two separate drags to be sure you get it all 04/23/16  Yes Tanda Rockers, MD  OXYGEN 4lpm 24/7    Historical Provider, MD    Physical Exam: Vitals:   11/05/16 2230 11/05/16 2245 11/05/16 2315 11/06/16 0045  BP: 133/81 117/92 127/82 126/91  Pulse: 79 89 87   Resp: 25 23 (!) 27   Temp:      TempSrc:      SpO2: 95% 94% 95%       Constitutional: Moderately built and nourished. Vitals:   11/05/16 2230 11/05/16  2245 11/05/16 2315 11/06/16 0045  BP: 133/81 117/92 127/82 126/91  Pulse: 79 89 87   Resp: 25 23 (!) 27   Temp:      TempSrc:      SpO2: 95% 94% 95%    Eyes: Anicteric no pallor. ENMT:  No discharge from the ears eyes nose and mouth. Neck:  No mass felt. No neck rigidity. No JVD appreciated. Respiratory:  No rhonchi or crepitations. Cardiovascular:  S1 and S2 heard no murmurs appreciated. Abdomen:  Soft nontender bowel sounds present. Musculoskeletal:  No edema. No joint effusion. Skin:  No rash. Skin appears  warm. Neurologic: alert awake oriented to time place and person. Moves all extremities. Psychiatric:  Appears normal. Normal affect.   Labs on Admission: I have personally reviewed following labs and imaging studies  CBC:  Recent Labs Lab 11/05/16 2108  WBC 5.6  NEUTROABS 2.4  HGB 13.6  HCT 40.2  MCV 83.6  PLT [id number]   Basic Metabolic Panel:  Recent Labs Lab 11/05/16 2108  NA 134*  K 4.3  CL 98*  CO2 26  GLUCOSE 122*  BUN 12  CREATININE 0.91  CALCIUM 8.4*   GFR: CrCl cannot be calculated (Unknown ideal weight.). Liver Function Tests:  Recent Labs Lab 11/05/16 2108  AST 31  ALT 22  ALKPHOS 77  BILITOT 0.2*  PROT 9.0*  ALBUMIN 2.3*   No results for input(s): LIPASE, AMYLASE in the last 168 hours. No results for input(s): AMMONIA in the last 168 hours. Coagulation Profile: No results for input(s): INR, PROTIME in the last 168 hours. Cardiac Enzymes: No results for input(s): CKTOTAL, CKMB, CKMBINDEX, TROPONINI in the last 168 hours. BNP (last 3 results) No results for input(s): PROBNP in the last 8760 hours. HbA1C: No results for input(s): HGBA1C in the last 72 hours. CBG: No results for input(s): GLUCAP in the last 168 hours. Lipid Profile: No results for input(s): CHOL, HDL, LDLCALC, TRIG, CHOLHDL, LDLDIRECT in the last 72 hours. Thyroid Function Tests: No results for input(s): TSH, T4TOTAL, FREET4, T3FREE, THYROIDAB in the last 72 hours. Anemia Panel: No results for input(s): VITAMINB12, FOLATE, FERRITIN, TIBC, IRON, RETICCTPCT in the last 72 hours. Urine analysis:    Component Value Date/Time   COLORURINE YELLOW 10/11/2015 [place] 10/11/2015 0414   LABSPEC 1.022 10/11/2015 0414   PHURINE 5.5 10/11/2015 0414   GLUCOSEU NEGATIVE 10/11/2015 0414   HGBUR NEGATIVE 10/11/2015 0414   BILIRUBINUR NEGATIVE 10/11/2015 0414   KETONESUR NEGATIVE 10/11/2015 0414   PROTEINUR NEGATIVE 10/11/2015 0414   UROBILINOGEN 0.2 11/11/2012 0145    NITRITE NEGATIVE 10/11/2015 0414   LEUKOCYTESUR NEGATIVE 10/11/2015 0414   Sepsis Labs: @LABRCNTIP (procalcitonin:4,lacticidven:4) )No results found for this or any previous visit (from the past 240 hour(s)).   Radiological Exams on Admission: Dg Chest Portable 1 View  Result Date: 11/05/2016 CLINICAL DATA:  Two weeks duration of productive cough and dyspnea. Midchest pain tonight. EXAM: PORTABLE CHEST 1 VIEW COMPARISON:  06/20/2016, 03/14/2016. FINDINGS: Loculated left pleural collection persists although the pleural air component is reduced or resolved. Pleural calcifications persist bilaterally. No new areas of parenchymal lung opacity. Persistent mild opacities adjacent to the pleural collection. Prominent pulmonary arterial contours are unchanged. Hilar and mediastinal contours are otherwise unremarkable. IMPRESSION: No acute findings. There is unchanged chronic pleural collection on the left and chronic pleural calcifications bilaterally. Stable chronic enlargement of the pulmonary arteries consistent with pulmonary arterial hypertension. Electronically Signed   By: Valerie Roys.D.  On: 11/05/2016 22:01    EKG: Independently reviewed. Normal sinus rhythm with anterior T-wave changes.  Assessment/Plan Principal Problem:   Chest pain Active Problems:   Pulmonary HTN   COPD GOLD IV criteria but never smoked    1. Chest pain with EKG changes concerning for ischemia - will cycle cardiac markers. Check 2-D echo for any wall motion abnormalities. Aspirin. Since patient has had recurrent episodes of empyema will check CT chest. 2. History of pulmonary hypertension - presently on no medications. May discuss with cardiology to see if patient needs to be back on Adcirca. 3. COPD - not actively wheezing I have continued patient's previous inhaler. 4. History of recurrent empyema status post VATS.   DVT prophylaxis:  SCDs until CT chest results available. Code Status:  Full code.  Family  Communication:  Discussed with patient.  Disposition Plan:  Home.  Consults called:  None.  Admission status:  Observation.    Rise Patience MD [facility] Pager [phone].  If 7PM-7AM, please contact night-coverage [email or web address] Password [email or web address]  11/06/2016, 1:04 AM

## 2016-11-06 NOTE — Progress Notes (Signed)
  Echocardiogram 2D Echocardiogram has been performed.  Jennette Dubin 11/06/2016, 1:20 PM

## 2016-11-07 DIAGNOSIS — I10 Essential (primary) hypertension: Secondary | ICD-10-CM | POA: Diagnosis present

## 2016-11-07 DIAGNOSIS — R0602 Shortness of breath: Secondary | ICD-10-CM | POA: Diagnosis present

## 2016-11-07 DIAGNOSIS — Z79899 Other long term (current) drug therapy: Secondary | ICD-10-CM | POA: Diagnosis not present

## 2016-11-07 DIAGNOSIS — J189 Pneumonia, unspecified organism: Secondary | ICD-10-CM | POA: Diagnosis not present

## 2016-11-07 DIAGNOSIS — I272 Pulmonary hypertension, unspecified: Secondary | ICD-10-CM | POA: Diagnosis not present

## 2016-11-07 DIAGNOSIS — Z9889 Other specified postprocedural states: Secondary | ICD-10-CM | POA: Diagnosis not present

## 2016-11-07 DIAGNOSIS — Z7982 Long term (current) use of aspirin: Secondary | ICD-10-CM | POA: Diagnosis not present

## 2016-11-07 DIAGNOSIS — Z87442 Personal history of urinary calculi: Secondary | ICD-10-CM | POA: Diagnosis not present

## 2016-11-07 DIAGNOSIS — Z8249 Family history of ischemic heart disease and other diseases of the circulatory system: Secondary | ICD-10-CM | POA: Diagnosis not present

## 2016-11-07 DIAGNOSIS — J869 Pyothorax without fistula: Secondary | ICD-10-CM | POA: Diagnosis present

## 2016-11-07 DIAGNOSIS — J440 Chronic obstructive pulmonary disease with acute lower respiratory infection: Secondary | ICD-10-CM | POA: Diagnosis present

## 2016-11-07 DIAGNOSIS — J449 Chronic obstructive pulmonary disease, unspecified: Secondary | ICD-10-CM | POA: Diagnosis not present

## 2016-11-07 DIAGNOSIS — Z9119 Patient's noncompliance with other medical treatment and regimen: Secondary | ICD-10-CM | POA: Diagnosis not present

## 2016-11-07 DIAGNOSIS — I248 Other forms of acute ischemic heart disease: Secondary | ICD-10-CM | POA: Diagnosis present

## 2016-11-07 DIAGNOSIS — J90 Pleural effusion, not elsewhere classified: Secondary | ICD-10-CM | POA: Diagnosis present

## 2016-11-07 DIAGNOSIS — I2721 Secondary pulmonary arterial hypertension: Secondary | ICD-10-CM | POA: Diagnosis present

## 2016-11-07 DIAGNOSIS — I471 Supraventricular tachycardia: Secondary | ICD-10-CM | POA: Diagnosis present

## 2016-11-07 DIAGNOSIS — R0789 Other chest pain: Secondary | ICD-10-CM | POA: Diagnosis not present

## 2016-11-07 LAB — LEGIONELLA PNEUMOPHILA SEROGP 1 UR AG: L. PNEUMOPHILA SEROGP 1 UR AG: NEGATIVE

## 2016-11-07 MED ORDER — AZITHROMYCIN 250 MG PO TABS
500.0000 mg | ORAL_TABLET | Freq: Every day | ORAL | Status: DC
  Administered 2016-11-08 – 2016-11-09 (×2): 500 mg via ORAL
  Filled 2016-11-07: qty 2

## 2016-11-07 NOTE — Progress Notes (Signed)
PHARMACIST - PHYSICIAN COMMUNICATION  CONCERNING: Antibiotic IV to Oral Route Change Policy  RECOMMENDATION: This patient is receiving azithromycin by the intravenous route.  Based on criteria approved by the Pharmacy and Therapeutics Committee, the antibiotic(s) is/are being converted to the equivalent oral dose form(s).   DESCRIPTION: These criteria include:  Patient being treated for a respiratory tract infection, urinary tract infection, cellulitis or clostridium difficile associated diarrhea if on metronidazole  The patient is not neutropenic and does not exhibit a GI malabsorption state  The patient is eating (either orally or via tube) and/or has been taking other orally administered medications for a least 24 hours  The patient is improving clinically and has a Tmax < 100.5  If you have questions about this conversion, please contact the Pharmacy Department  []   [phone] )  [facility] []   [phone] )  [facility] [x]   [phone] )  [facility] []   [phone] )  [facility] []   [phone] )  [place], [place] D 11/07/2016 8:55 AM

## 2016-11-07 NOTE — Progress Notes (Signed)
PROGRESS NOTE    [name] [name]  [id number] DOB: 07/13/1961 DOA: 11/05/2016 PCP: No PCP Per Patient   Outpatient Specialists:     Brief Narrative:  [name] [name] is a 56 y.o. male with history of recurrent empyema status post VATS with history of pulmonary hypertension and COPD presents to the ER because of chest pain. Patient has been having chest pain last 2 days which was retrosternal and nonradiating with some shortness of breath. Chest pain lasts a few minutes each time. Increases on exertion. Chest x-ray shows chronic lung changes with pleural effusion on the left side. EKG shows anterior T-wave changes concerning for ischemia. At the time of my exam patient is chest pain-free without any intervention. Patient will be admitted for further management of chest pain. Patient is afebrile, denies any productive cough.  Patient used to be on medications for pulmonary hypertension and COPD which patient states has not taken for last few months because of no refill.   Assessment & Plan:   Principal Problem:   Chest pain Active Problems:   Pulmonary HTN   COPD GOLD IV criteria but never smoked    Community acquired pneumonia  CAP -sputum culture pending- may need to change abx based on this -rocephin/azitho -nebs-- (brovana, incruse ellipta)  Chest pain - Atypical. EKG with new anterior TWI in setting of LVH. Troponin negative. No cardiac work up planned  [place] on 8/17 with mild to moderate PAH.  -started on Adcirca 20mg  qd. But never took -will need to follow in CHF clinic to get this medication  Recurrent left-sided empyema s/p drainage - Unchanged per CTA. Followed by Dr Servando Snare. -has appointment in 1 month with CVTS  Needs PCP -follow up with CVTS and CHF clinic for pHTN meds    DVT prophylaxis:  SCD's  Code Status: Full Code   Family Communication:   Disposition Plan:     Consultants:   cards   Subjective: At rest breathing well O2 sats dropped  while ambulating  Objective: Vitals:   11/06/16 2146 11/07/16 0617 11/07/16 0947 11/07/16 1349  BP: 93/68 106/66  109/65  Pulse: 79 81  88  Resp: 16 18    Temp: 97.9 F (36.6 C) 97.9 F (36.6 C)  98.1 F (36.7 C)  TempSrc: Oral Oral  Oral  SpO2: 97% 99% 99% 98%  Weight:  41.8 kg (92 lb 3.2 oz)    Height:        Intake/Output Summary (Last 24 hours) at 11/07/16 1528 Last data filed at 11/07/16 1350  Gross per 24 hour  Intake             1146 ml  Output              525 ml  Net              621 ml   Filed Weights   11/06/16 0110 11/07/16 0617  Weight: 41.3 kg (91 lb) 41.8 kg (92 lb 3.2 oz)    Examination:  General exam: Appears calm and comfortable  Respiratory system: diminished, left and right base. Cardiovascular system: S1 & S2 heard, RRR. No JVD, murmurs, rubs, gallops or clicks. No pedal edema. Gastrointestinal system: Abdomen is nondistended, soft and nontender. No organomegaly or masses felt. Normal bowel sounds heard. Central nervous system: Alert and oriented. No focal neurological deficits.     Data Reviewed: I have personally reviewed following labs and imaging studies  CBC:  Recent Labs Lab 11/05/16 2108  WBC 5.6  NEUTROABS 2.4  HGB 13.6  HCT 40.2  MCV 83.6  PLT [id number]   Basic Metabolic Panel:  Recent Labs Lab 11/05/16 2108  NA 134*  K 4.3  CL 98*  CO2 26  GLUCOSE 122*  BUN 12  CREATININE 0.91  CALCIUM 8.4*   GFR: Estimated Creatinine Clearance: 54.9 mL/min (by C-G formula based on SCr of 0.91 mg/dL). Liver Function Tests:  Recent Labs Lab 11/05/16 2108  AST 31  ALT 22  ALKPHOS 77  BILITOT 0.2*  PROT 9.0*  ALBUMIN 2.3*   No results for input(s): LIPASE, AMYLASE in the last 168 hours. No results for input(s): AMMONIA in the last 168 hours. Coagulation Profile: No results for input(s): INR, PROTIME in the last 168 hours. Cardiac Enzymes:  Recent Labs Lab 11/06/16 0137 11/06/16 0738 11/06/16 1349  TROPONINI <0.03 0.03*  0.03*   BNP (last 3 results) No results for input(s): PROBNP in the last 8760 hours. HbA1C: No results for input(s): HGBA1C in the last 72 hours. CBG: No results for input(s): GLUCAP in the last 168 hours. Lipid Profile: No results for input(s): CHOL, HDL, LDLCALC, TRIG, CHOLHDL, LDLDIRECT in the last 72 hours. Thyroid Function Tests: No results for input(s): TSH, T4TOTAL, FREET4, T3FREE, THYROIDAB in the last 72 hours. Anemia Panel: No results for input(s): VITAMINB12, FOLATE, FERRITIN, TIBC, IRON, RETICCTPCT in the last 72 hours. Urine analysis:    Component Value Date/Time   COLORURINE YELLOW 10/11/2015 [place] 10/11/2015 0414   LABSPEC 1.022 10/11/2015 0414   PHURINE 5.5 10/11/2015 0414   GLUCOSEU NEGATIVE 10/11/2015 0414   HGBUR NEGATIVE 10/11/2015 0414   BILIRUBINUR NEGATIVE 10/11/2015 0414   KETONESUR NEGATIVE 10/11/2015 0414   PROTEINUR NEGATIVE 10/11/2015 0414   UROBILINOGEN 0.2 11/11/2012 0145   NITRITE NEGATIVE 10/11/2015 0414   LEUKOCYTESUR NEGATIVE 10/11/2015 0414     ) Recent Results (from the past 240 hour(s))  Culture, sputum-assessment     Status: None   Collection Time: 11/06/16  4:01 AM  Result Value Ref Range Status   Specimen Description EXPECTORATED SPUTUM  Final   Special Requests NONE  Final   Sputum evaluation THIS SPECIMEN IS ACCEPTABLE FOR SPUTUM CULTURE  Final   Report Status 11/06/2016 FINAL  Final  Culture, respiratory (NON-Expectorated)     Status: None (Preliminary result)   Collection Time: 11/06/16  4:01 AM  Result Value Ref Range Status   Specimen Description EXPECTORATED SPUTUM  Final   Special Requests NONE Reflexed from [id number]  Final   Gram Stain   Final    ABUNDANT WBC PRESENT, PREDOMINANTLY PMN MODERATE GRAM POSITIVE COCCI MODERATE GRAM NEGATIVE RODS FEW GRAM NEGATIVE DIPLOCOCCI    Culture CULTURE REINCUBATED FOR BETTER GROWTH  Final   Report Status PENDING  Incomplete      Anti-infectives    Start      Dose/Rate Route Frequency Ordered Stop   11/08/16 1000  azithromycin (ZITHROMAX) tablet 500 mg     500 mg Oral Daily 11/07/16 0856     11/07/16 0400  cefTRIAXone (ROCEPHIN) 1 g in dextrose 5 % 50 mL IVPB     1 g 100 mL/hr over 30 Minutes Intravenous Every 24 hours 11/06/16 0401 11/14/16 0359   11/06/16 0500  azithromycin (ZITHROMAX) 500 mg in dextrose 5 % 250 mL IVPB  Status:  Discontinued     500 mg 250 mL/hr over 60 Minutes Intravenous Every 24 hours 11/06/16 0401 11/07/16 0856   11/06/16 0415  cefTRIAXone (ROCEPHIN) 1 g in dextrose 5 % 50 mL IVPB     1 g 100 mL/hr over 30 Minutes Intravenous  Once 11/06/16 0403 11/06/16 0518       Radiology Studies: Ct Angio Chest Pe W Or Wo Contrast  Result Date: 11/06/2016 CLINICAL DATA:  Dyspnea tonight EXAM: CT ANGIOGRAPHY CHEST WITH CONTRAST TECHNIQUE: Multidetector CT imaging of the chest was performed using the standard protocol during bolus administration of intravenous contrast. Multiplanar CT image reconstructions and MIPs were obtained to evaluate the vascular anatomy. CONTRAST:  100 mL Isovue 370 intravenous COMPARISON:  12/14/2015 FINDINGS: Cardiovascular: Satisfactory opacification of the pulmonary arteries to the segmental level. No evidence of pulmonary embolism. Stable chronic enlargement of the pulmonary arteries consistent with pulmonary arterial hypertension. The thoracic aorta is normal in caliber and intact. No pericardial effusion. Mediastinum/Nodes: No enlarged mediastinal, hilar, or axillary lymph nodes. Thyroid gland, trachea, and esophagus demonstrate no significant findings. Lungs/Pleura: New patchy airspace opacities in the right lower lobe and in the right middle lobe base. This may represent pneumonia. Persistent chronic pleural air and flu collection on the left, with resolution of the air-fluid level in the upper left hemithorax. Upper Abdomen: No acute abnormality. Musculoskeletal: No significant skeletal lesion. Review of the  MIP images confirms the above findings. IMPRESSION: 1. Negative for acute pulmonary embolism. 2. New patchy airspace consolidation in the right lower lobe and in the right middle lobe base. This could represent pneumonia. 3. Chronic pulmonary arterial enlargement consistent with pulmonary arterial hypertension, unchanged. 4. Chronic left hemithorax pleural collection with air and fluid components, slightly reduced in size with resolution of the upper hemithorax air-fluid level since 12/14/2015. Electronically Signed   By: Andreas Newport M.D.   On: 11/06/2016 03:12   Dg Chest Portable 1 View  Result Date: 11/05/2016 CLINICAL DATA:  Two weeks duration of productive cough and dyspnea. Midchest pain tonight. EXAM: PORTABLE CHEST 1 VIEW COMPARISON:  06/20/2016, 03/14/2016. FINDINGS: Loculated left pleural collection persists although the pleural air component is reduced or resolved. Pleural calcifications persist bilaterally. No new areas of parenchymal lung opacity. Persistent mild opacities adjacent to the pleural collection. Prominent pulmonary arterial contours are unchanged. Hilar and mediastinal contours are otherwise unremarkable. IMPRESSION: No acute findings. There is unchanged chronic pleural collection on the left and chronic pleural calcifications bilaterally. Stable chronic enlargement of the pulmonary arteries consistent with pulmonary arterial hypertension. Electronically Signed   By: Andreas Newport M.D.   On: 11/05/2016 22:01        Scheduled Meds: . arformoterol  15 mcg Nebulization BID  . aspirin EC  325 mg Oral Daily  . [START ON 11/08/2016] azithromycin  500 mg Oral Daily  . cefTRIAXone (ROCEPHIN)  IV  1 g Intravenous Q24H  . umeclidinium bromide  1 puff Inhalation Daily   Continuous Infusions:   LOS: 0 days    Time spent: 35 min    [place], DO [facility] Pager [phone]  If 7PM-7AM, please contact night-coverage [email or web address] Password  [email or web address] 11/07/2016, 3:28 PM

## 2016-11-07 NOTE — Progress Notes (Signed)
SATURATION QUALIFICATIONS: (This note is used to comply with regulatory documentation for home oxygen)  Patient Saturations on Room Air at Rest = 94%  Patient Saturations on Room Air while Ambulating = 83%  Patient Saturations on 1 Liters of oxygen while Ambulating = 92%  Please briefly explain why patient needs home oxygen:  [name] [name]

## 2016-11-08 DIAGNOSIS — J189 Pneumonia, unspecified organism: Secondary | ICD-10-CM

## 2016-11-08 LAB — BASIC METABOLIC PANEL
ANION GAP: 3 — AB (ref 5–15)
BUN: 8 mg/dL (ref 6–20)
CHLORIDE: 96 mmol/L — AB (ref 101–111)
CO2: 36 mmol/L — AB (ref 22–32)
Calcium: 8.3 mg/dL — ABNORMAL LOW (ref 8.9–10.3)
Creatinine, Ser: 0.9 mg/dL (ref 0.61–1.24)
GFR calc non Af Amer: >60 mL/min (ref >60)
Glucose, Bld: 93 mg/dL (ref 65–99)
POTASSIUM: 4.7 mmol/L (ref 3.5–5.1)
SODIUM: 135 mmol/L (ref 135–145)

## 2016-11-08 LAB — CULTURE, RESPIRATORY

## 2016-11-08 LAB — CULTURE, RESPIRATORY W GRAM STAIN: Culture: NORMAL

## 2016-11-08 LAB — CBC
HEMATOCRIT: 41.8 % (ref 39.0–52.0)
HEMOGLOBIN: 13.4 g/dL (ref 13.0–17.0)
MCH: 27.5 pg (ref 26.0–34.0)
MCHC: 32.1 g/dL (ref 30.0–36.0)
MCV: 85.8 fL (ref 78.0–100.0)
Platelets: 316 10*3/uL (ref 150–400)
RBC: 4.87 MIL/uL (ref 4.22–5.81)
RDW: 14 % (ref 11.5–15.5)
WBC: 8.3 10*3/uL (ref 4.0–10.5)

## 2016-11-08 LAB — MAGNESIUM: MAGNESIUM: 2 mg/dL (ref 1.7–2.4)

## 2016-11-08 NOTE — Progress Notes (Signed)
Notified MD Rai verbally while she was on the unit of some SVT in the 160's. (please see strip saved) I will continue to monitor the client closely.   Saddie Benders RN

## 2016-11-08 NOTE — Care Management Note (Signed)
Case Management Note  Patient Details  Name: [name] [name] MRN: [id number] Date of Birth: [birth year]  Subjective/Objective:  Pt presented for Chest Pain. Pt is from home. Pt has insurance [employer]- pt is without PCP. Pt is aware to contact his insurance provider for PCP or to call [facility] number provided to him. Pt has DME 02 at night.                  Action/Plan: Staff RN was to ambulate patient to see if he needed increase in 02 Liter flow. Per Staff RN pt continued to stay on 1 L while ambulating. No further needs from CM at this time.   Expected Discharge Date:                  Expected Discharge Plan:  Home/Self Care  In-House Referral:  NA  Discharge planning Services  CM Consult  Post Acute Care Choice:  NA Choice offered to:  NA  DME Arranged:  N/A DME Agency:  NA  HH Arranged:  NA HH Agency:  NA  Status of Service:  Completed, signed off  If discussed at [place] of Stay Meetings, dates discussed:    Additional Comments:  Bethena Roys, RN 11/08/2016, 3:48 PM

## 2016-11-08 NOTE — Progress Notes (Signed)
[facility]                                                                              Patient Demographics  [name] [name], is a 56 y.o. male, DOB - 1961/01/30, [id number]  Admit date - 11/05/2016   Admitting Physician Rise Patience, MD  Outpatient Primary MD for the patient is No PCP Per Patient  Outpatient specialists:   LOS - 1  days    Chief Complaint  Patient presents with  . Cough  . Shortness of Breath       Brief summary   [name] [name] a 56 y.o.malewith history of recurrent empyema status post VATS with history of pulmonary hypertension and COPD presents to the ER because of chest pain. Patient has been having chest pain last 2 days which was retrosternal and nonradiating with some shortness of breath. Chest pain lasts a few minutes each time. Increases on exertion. Chest x-ray shows chronic lung changes with pleural effusion onthe left side. EKG shows anterior T-wave changes concerning for ischemia.Patient used to be on medications for pulmonary hypertension and COPD which patient states has not taken for last few months because of no refill. Patient was admitted for chest pain rule out.   Assessment & Plan    Community-acquired pneumonia - Sputum culture with normal flora  - HIV negative, urine strep antigen negative, Legionella negative  - Continue IV Rocephin, Zithromax  - Home O2 evaluation  - Continue nebs -- (brovana, incruse ellipta)  Atypical chest pain - Atypical. EKG with new anterior TWI in setting of LVH. Troponin negative. -2-D echo showed EF of 60-65% with grade 2 diastolic dysfunction, moderate pulmonary hypertension   Pulmonary hypertension - RHC on 8/17 with mild to moderate PAH.  -started on Adcirca 20mg  qd. But never took -will need to follow in CHF clinic to get this medicationComment discussed with case management for follow-up appointment  Recurrent left-sided empyema s/p drainage - Unchanged per CTA. Followed  by Dr Servando Snare. -has appointment in 1 month with CVTS  Needs PCP -follow up with CVTS and CHF clinic for pHTN medsComment discussed with case management  Nonsustained SVT, asymptomatic  - Recent 2-D echo with preserved EF, follow mag, potassium 4.7  Code Status: Full CODE STATUS  DVT Prophylaxis: SCD's Family Communication: Discussed in detail with the patient, all imaging results, lab results explained to the patient   Disposition Plan  Time Spent in minutes   25 minutes  Procedures:  2-D echo  Consultants:   none   Antimicrobials:    Zithromax  IV Rocephin   Medications  Scheduled Meds: . arformoterol  15 mcg Nebulization BID  . aspirin EC  325 mg Oral Daily  . azithromycin  500 mg Oral Daily  . cefTRIAXone (ROCEPHIN)  IV  1 g Intravenous Q24H  . umeclidinium bromide  1 puff Inhalation Daily   Continuous Infusions: PRN Meds:.acetaminophen, alum & mag hydroxide-simeth, guaiFENesin-dextromethorphan, ondansetron (ZOFRAN) IV   Antibiotics   Anti-infectives    Start     Dose/Rate Route Frequency Ordered Stop   11/08/16 1000  azithromycin (ZITHROMAX) tablet 500 mg  500 mg Oral Daily 11/07/16 0856     11/07/16 0400  cefTRIAXone (ROCEPHIN) 1 g in dextrose 5 % 50 mL IVPB     1 g 100 mL/hr over 30 Minutes Intravenous Every 24 hours 11/06/16 0401 11/14/16 0359   11/06/16 0500  azithromycin (ZITHROMAX) 500 mg in dextrose 5 % 250 mL IVPB  Status:  Discontinued     500 mg 250 mL/hr over 60 Minutes Intravenous Every 24 hours 11/06/16 0401 11/07/16 0856   11/06/16 0415  cefTRIAXone (ROCEPHIN) 1 g in dextrose 5 % 50 mL IVPB     1 g 100 mL/hr over 30 Minutes Intravenous  Once 11/06/16 0403 11/06/16 0518        Subjective:   [name] [name] was seen and examined today. Patient denies dizziness, chest pain, shortness of breath, abdominal pain, N/V/D/C, new weakness, numbess, tingling. No acute events overnight.    Objective:   Vitals:   11/07/16 2050 11/07/16 2104  11/08/16 0522 11/08/16 0746  BP:  104/70 108/75   Pulse: 79 84 85   Resp:      Temp:  98.2 F (36.8 C) 98.7 F (37.1 C)   TempSrc:  Oral Oral   SpO2: 99% 97% 99% 100%  Weight:   44.5 kg (98 lb)   Height:        Intake/Output Summary (Last 24 hours) at 11/08/16 1131 Last data filed at 11/08/16 1000  Gross per 24 hour  Intake             1066 ml  Output                0 ml  Net             1066 ml     Wt Readings from Last 3 Encounters:  11/08/16 44.5 kg (98 lb)  07/29/16 42.2 kg (93 lb)  06/24/16 49.9 kg (110 lb)     Exam  General: Alert and oriented x 3, NAD  HEENT:    Neck: Supple, no JVD, no masses  Cardiovascular: S1 S2 auscultated, no rubs, murmurs or gallops. Regular rate and rhythm.  Respiratory: Clear to auscultation bilaterally, no wheezing, rales or rhonchi  Gastrointestinal: Soft, nontender, nondistended, + bowel sounds  Ext: no cyanosis clubbing or edema  Neuro: AAOx3, Cr N's II- XII. Strength 5/5 upper and lower extremities bilaterally  Skin: No rashes  Psych: Normal affect and demeanor, alert and oriented x3    Data Reviewed:  I have personally reviewed following labs and imaging studies  Micro Results Recent Results (from the past 240 hour(s))  Culture, sputum-assessment     Status: None   Collection Time: 11/06/16  4:01 AM  Result Value Ref Range Status   Specimen Description EXPECTORATED SPUTUM  Final   Special Requests NONE  Final   Sputum evaluation THIS SPECIMEN IS ACCEPTABLE FOR SPUTUM CULTURE  Final   Report Status 11/06/2016 FINAL  Final  Culture, respiratory (NON-Expectorated)     Status: None   Collection Time: 11/06/16  4:01 AM  Result Value Ref Range Status   Specimen Description EXPECTORATED SPUTUM  Final   Special Requests NONE Reflexed from [id number]  Final   Gram Stain   Final    ABUNDANT WBC PRESENT, PREDOMINANTLY PMN MODERATE GRAM POSITIVE COCCI MODERATE GRAM NEGATIVE RODS FEW GRAM NEGATIVE DIPLOCOCCI    Culture  Consistent with normal respiratory flora.  Final   Report Status 11/08/2016 FINAL  Final    Radiology Reports Ct Angio  Chest Pe W Or Wo Contrast  Result Date: 11/06/2016 CLINICAL DATA:  Dyspnea tonight EXAM: CT ANGIOGRAPHY CHEST WITH CONTRAST TECHNIQUE: Multidetector CT imaging of the chest was performed using the standard protocol during bolus administration of intravenous contrast. Multiplanar CT image reconstructions and MIPs were obtained to evaluate the vascular anatomy. CONTRAST:  100 mL Isovue 370 intravenous COMPARISON:  12/14/2015 FINDINGS: Cardiovascular: Satisfactory opacification of the pulmonary arteries to the segmental level. No evidence of pulmonary embolism. Stable chronic enlargement of the pulmonary arteries consistent with pulmonary arterial hypertension. The thoracic aorta is normal in caliber and intact. No pericardial effusion. Mediastinum/Nodes: No enlarged mediastinal, hilar, or axillary lymph nodes. Thyroid gland, trachea, and esophagus demonstrate no significant findings. Lungs/Pleura: New patchy airspace opacities in the right lower lobe and in the right middle lobe base. This may represent pneumonia. Persistent chronic pleural air and flu collection on the left, with resolution of the air-fluid level in the upper left hemithorax. Upper Abdomen: No acute abnormality. Musculoskeletal: No significant skeletal lesion. Review of the MIP images confirms the above findings. IMPRESSION: 1. Negative for acute pulmonary embolism. 2. New patchy airspace consolidation in the right lower lobe and in the right middle lobe base. This could represent pneumonia. 3. Chronic pulmonary arterial enlargement consistent with pulmonary arterial hypertension, unchanged. 4. Chronic left hemithorax pleural collection with air and fluid components, slightly reduced in size with resolution of the upper hemithorax air-fluid level since 12/14/2015. Electronically Signed   By: Andreas Newport M.D.   On:  11/06/2016 03:12   Dg Chest Portable 1 View  Result Date: 11/05/2016 CLINICAL DATA:  Two weeks duration of productive cough and dyspnea. Midchest pain tonight. EXAM: PORTABLE CHEST 1 VIEW COMPARISON:  06/20/2016, 03/14/2016. FINDINGS: Loculated left pleural collection persists although the pleural air component is reduced or resolved. Pleural calcifications persist bilaterally. No new areas of parenchymal lung opacity. Persistent mild opacities adjacent to the pleural collection. Prominent pulmonary arterial contours are unchanged. Hilar and mediastinal contours are otherwise unremarkable. IMPRESSION: No acute findings. There is unchanged chronic pleural collection on the left and chronic pleural calcifications bilaterally. Stable chronic enlargement of the pulmonary arteries consistent with pulmonary arterial hypertension. Electronically Signed   By: Andreas Newport M.D.   On: 11/05/2016 22:01    Lab Data:  CBC:  Recent Labs Lab 11/05/16 2108 11/08/16 0524  WBC 5.6 8.3  NEUTROABS 2.4  --   HGB 13.6 13.4  HCT 40.2 41.8  MCV 83.6 85.8  PLT 300 [id number]   Basic Metabolic Panel:  Recent Labs Lab 11/05/16 2108 11/08/16 0524  NA 134* 135  K 4.3 4.7  CL 98* 96*  CO2 26 36*  GLUCOSE 122* 93  BUN 12 8  CREATININE 0.91 0.90  CALCIUM 8.4* 8.3*   GFR: Estimated Creatinine Clearance: 59.1 mL/min (by C-G formula based on SCr of 0.9 mg/dL). Liver Function Tests:  Recent Labs Lab 11/05/16 2108  AST 31  ALT 22  ALKPHOS 77  BILITOT 0.2*  PROT 9.0*  ALBUMIN 2.3*   No results for input(s): LIPASE, AMYLASE in the last 168 hours. No results for input(s): AMMONIA in the last 168 hours. Coagulation Profile: No results for input(s): INR, PROTIME in the last 168 hours. Cardiac Enzymes:  Recent Labs Lab 11/06/16 0137 11/06/16 0738 11/06/16 1349  TROPONINI <0.03 0.03* 0.03*   BNP (last 3 results) No results for input(s): PROBNP in the last 8760 hours. HbA1C: No results for  input(s): HGBA1C in the last 72 hours. CBG: No  results for input(s): GLUCAP in the last 168 hours. Lipid Profile: No results for input(s): CHOL, HDL, LDLCALC, TRIG, CHOLHDL, LDLDIRECT in the last 72 hours. Thyroid Function Tests: No results for input(s): TSH, T4TOTAL, FREET4, T3FREE, THYROIDAB in the last 72 hours. Anemia Panel: No results for input(s): VITAMINB12, FOLATE, FERRITIN, TIBC, IRON, RETICCTPCT in the last 72 hours. Urine analysis:    Component Value Date/Time   COLORURINE YELLOW 10/11/2015 [place] 10/11/2015 0414   LABSPEC 1.022 10/11/2015 0414   PHURINE 5.5 10/11/2015 0414   GLUCOSEU NEGATIVE 10/11/2015 0414   HGBUR NEGATIVE 10/11/2015 0414   BILIRUBINUR NEGATIVE 10/11/2015 0414   KETONESUR NEGATIVE 10/11/2015 0414   PROTEINUR NEGATIVE 10/11/2015 0414   UROBILINOGEN 0.2 11/11/2012 0145   NITRITE NEGATIVE 10/11/2015 0414   LEUKOCYTESUR NEGATIVE 10/11/2015 0414     Baudelio Karnes M.D. [facility] 11/08/2016, 11:31 AM  Pager: [phone] Between 7am to 7pm - call Pager - [phone]  After 7pm go to [email or web address] - password [email or web address]  Call night coverage person covering after 7pm

## 2016-11-09 LAB — BASIC METABOLIC PANEL
Anion gap: 10 (ref 5–15)
BUN: 10 mg/dL (ref 6–20)
CALCIUM: 8.1 mg/dL — AB (ref 8.9–10.3)
CHLORIDE: 97 mmol/L — AB (ref 101–111)
CO2: 29 mmol/L (ref 22–32)
CREATININE: 0.79 mg/dL (ref 0.61–1.24)
Glucose, Bld: 87 mg/dL (ref 65–99)
Potassium: 4.6 mmol/L (ref 3.5–5.1)
SODIUM: 136 mmol/L (ref 135–145)

## 2016-11-09 LAB — CBC
HCT: 41.8 % (ref 39.0–52.0)
Hemoglobin: 13.9 g/dL (ref 13.0–17.0)
MCH: 27.5 pg (ref 26.0–34.0)
MCHC: 33.3 g/dL (ref 30.0–36.0)
MCV: 82.8 fL (ref 78.0–100.0)
PLATELETS: 161 10*3/uL (ref 150–400)
RBC: 5.05 MIL/uL (ref 4.22–5.81)
RDW: 13.7 % (ref 11.5–15.5)
WBC: 6.4 10*3/uL (ref 4.0–10.5)

## 2016-11-09 LAB — MAGNESIUM: MAGNESIUM: 1.9 mg/dL (ref 1.7–2.4)

## 2016-11-09 MED ORDER — AZITHROMYCIN 500 MG PO TABS: 500.0000 mg | ORAL_TABLET | Freq: Every day | ORAL | 0 refills | Status: DC

## 2016-11-09 MED ORDER — CEFUROXIME AXETIL 500 MG PO TABS: 500.0000 mg | ORAL_TABLET | Freq: Two times a day (BID) | ORAL | 0 refills | Status: DC

## 2016-11-09 MED ORDER — ASPIRIN 325 MG PO TBEC: 325.0000 mg | DELAYED_RELEASE_TABLET | Freq: Every day | ORAL | 3 refills | Status: DC

## 2016-11-09 MED ORDER — GUAIFENESIN-DM 100-10 MG/5ML PO SYRP: 5.0000 mL | ORAL_SOLUTION | ORAL | 0 refills | Status: DC | PRN

## 2016-11-09 MED ORDER — ALBUTEROL SULFATE HFA 108 (90 BASE) MCG/ACT IN AERS: 2.0000 | INHALATION_SPRAY | Freq: Four times a day (QID) | RESPIRATORY_TRACT | 2 refills | Status: DC | PRN

## 2016-11-09 NOTE — Discharge Summary (Signed)
Physician Discharge Summary   Patient ID: [name] [name] MRN: [id number] DOB/AGE: [birth year] 56 y.o.  Admit date: 11/05/2016 Discharge date: 11/09/2016  Primary Care Physician:  No PCP Per Patient  Discharge Diagnoses:     Community-acquired pneumonia . atypical Chest pain . Pulmonary HTN . COPD GOLD IV criteria but never smoked  . CAP (community acquired pneumonia)   Consults:  None   Recommendations for Outpatient Follow-up:  1. Please repeat CBC/BMET at next visit   DIET: Heart healthy diet    Allergies:  No Known Allergies   DISCHARGE MEDICATIONS: Current Discharge Medication List    START taking these medications   Details  albuterol (PROVENTIL HFA;VENTOLIN HFA) 108 (90 Base) MCG/ACT inhaler Inhale 2 puffs into the lungs every 6 (six) hours as needed for wheezing or shortness of breath. Qty: 1 Inhaler, Refills: 2    aspirin EC 325 MG EC tablet Take 1 tablet (325 mg total) by mouth daily. Qty: 30 tablet, Refills: 3    azithromycin (ZITHROMAX) 500 MG tablet Take 1 tablet (500 mg total) by mouth daily. Qty: 5 tablet, Refills: 0    cefUROXime (CEFTIN) 500 MG tablet Take 1 tablet (500 mg total) by mouth 2 (two) times daily with a meal. X 5days Qty: 10 tablet, Refills: 0    guaiFENesin-dextromethorphan (ROBITUSSIN DM) 100-10 MG/5ML syrup Take 5 mLs by mouth every 4 (four) hours as needed for cough. Qty: 118 mL, Refills: 0      CONTINUE these medications which have NOT CHANGED   Details  umeclidinium-vilanterol (ANORO ELLIPTA) 62.5-25 MCG/INH AEPB Inhale 2 puffs into the lungs once. Only open the device one time and then take your two separate drags to be sure you get it all Qty: 1 each, Refills: 11    OXYGEN 4lpm 24/7    tadalafil, PAH, (ADCIRCA) 20 MG tablet Take 1 tablet (20 mg total) by mouth daily. Qty: 30 tablet, Refills: 11         Brief H and P: For complete details please refer to admission H and P, but in brief [name] [name] a 56 y.o.malewith history  of recurrent empyema status post VATS with history of pulmonary hypertension and COPD presents to the ER because of chest pain. Patient has been having chest pain last 2 days which was retrosternal and nonradiating with some shortness of breath. Chest pain lasts a few minutes each time. Increases on exertion. Chest x-ray shows chronic lung changes with pleural effusion onthe left side. EKG shows anterior T-wave changes concerning for ischemia.Patient used to be on medications for pulmonary hypertension and COPD which patient states has not taken for last few months because of no refill. Patient was admitted for chest pain rule out.  Hospital Course:   Community-acquired pneumonia - Sputum culture with normal flora  - HIV negative, urine strep antigen negative, Legionella negative  -Patient was placed on IV Zithromax and Rocephin. He was transitioned to oral Zithromax and Ceftin at discharge   Home O2 evaluation showed no  extra oxygen needed - Continue -- (Ventolin , incruse ellipta)  Atypical chest pain - Atypical. EKG with new anterior TWI in setting of LVH. Troponin negative. -2-D echo showed EF of 60-65% with grade 2 diastolic dysfunction, moderate pulmonary hypertension   Pulmonary hypertension - RHC on 8/17 with mild to moderate PAH.  - started on Adcirca 20mg  qd. But never took - will need to follow in CHF clinic to get this medication,  discussed with case management for follow-up appointment, discussed  with patient to call the clinic for the follow-up appointment.  Recurrent left-sided empyema s/p drainage - Unchanged per CTA. Followed by Dr Servando Snare. -has appointment in 1 month with CVTS  Needs PCP -follow up with CVTS and CHF clinic for pHTN meds, discussed with case management. Per case management, patient has insurance [employer], patient is aware to contact his insurance provider for PCP or to call health connect numbers provided to him.    Day of Discharge BP 101/76 (BP  Location: Right Arm)   Pulse 82   Temp 97.9 F (36.6 C) (Oral)   Resp 18   Ht 5' (1.524 m)   Wt 40.8 kg (89 lb 14.4 oz)   SpO2 98%   BMI 17.56 kg/m   Physical Exam: General: Alert and awake oriented x3 not in any acute distress. HEENT: anicteric sclera, pupils reactive to light and accommodation CVS: S1-S2 clear no murmur rubs or gallops Chest: clear to auscultation bilaterally, no wheezing rales or rhonchi Abdomen: soft nontender, nondistended, normal bowel sounds Extremities: no cyanosis, clubbing or edema noted bilaterally Neuro: Cranial nerves II-XII intact, no focal neurological deficits   The results of significant diagnostics from this hospitalization (including imaging, microbiology, ancillary and laboratory) are listed below for reference.    LAB RESULTS: Basic Metabolic Panel:  Recent Labs Lab 11/08/16 0524  11/09/16 0524  NA 135  --  136  K 4.7  --  4.6  CL 96*  --  97*  CO2 36*  --  29  GLUCOSE 93  --  87  BUN 8  --  10  CREATININE 0.90  --  0.79  CALCIUM 8.3*  --  8.1*  MG  --   < > 1.9  < > = values in this interval not displayed. Liver Function Tests:  Recent Labs Lab 11/05/16 2108  AST 31  ALT 22  ALKPHOS 77  BILITOT 0.2*  PROT 9.0*  ALBUMIN 2.3*   No results for input(s): LIPASE, AMYLASE in the last 168 hours. No results for input(s): AMMONIA in the last 168 hours. CBC:  Recent Labs Lab 11/05/16 2108 11/08/16 0524 11/09/16 0524  WBC 5.6 8.3 6.4  NEUTROABS 2.4  --   --   HGB 13.6 13.4 13.9  HCT 40.2 41.8 41.8  MCV 83.6 85.8 82.8  PLT 300 316 161   Cardiac Enzymes:  Recent Labs Lab 11/06/16 0738 11/06/16 1349  TROPONINI 0.03* 0.03*   BNP: Invalid input(s): POCBNP CBG: No results for input(s): GLUCAP in the last 168 hours.  Significant Diagnostic Studies:  Ct Angio Chest Pe W Or Wo Contrast  Result Date: 11/06/2016 CLINICAL DATA:  Dyspnea tonight EXAM: CT ANGIOGRAPHY CHEST WITH CONTRAST TECHNIQUE: Multidetector CT  imaging of the chest was performed using the standard protocol during bolus administration of intravenous contrast. Multiplanar CT image reconstructions and MIPs were obtained to evaluate the vascular anatomy. CONTRAST:  100 mL Isovue 370 intravenous COMPARISON:  12/14/2015 FINDINGS: Cardiovascular: Satisfactory opacification of the pulmonary arteries to the segmental level. No evidence of pulmonary embolism. Stable chronic enlargement of the pulmonary arteries consistent with pulmonary arterial hypertension. The thoracic aorta is normal in caliber and intact. No pericardial effusion. Mediastinum/Nodes: No enlarged mediastinal, hilar, or axillary lymph nodes. Thyroid gland, trachea, and esophagus demonstrate no significant findings. Lungs/Pleura: New patchy airspace opacities in the right lower lobe and in the right middle lobe base. This may represent pneumonia. Persistent chronic pleural air and flu collection on the left, with resolution of the air-fluid  level in the upper left hemithorax. Upper Abdomen: No acute abnormality. Musculoskeletal: No significant skeletal lesion. Review of the MIP images confirms the above findings. IMPRESSION: 1. Negative for acute pulmonary embolism. 2. New patchy airspace consolidation in the right lower lobe and in the right middle lobe base. This could represent pneumonia. 3. Chronic pulmonary arterial enlargement consistent with pulmonary arterial hypertension, unchanged. 4. Chronic left hemithorax pleural collection with air and fluid components, slightly reduced in size with resolution of the upper hemithorax air-fluid level since 12/14/2015. Electronically Signed   By: Andreas Newport M.D.   On: 11/06/2016 03:12   Dg Chest Portable 1 View  Result Date: 11/05/2016 CLINICAL DATA:  Two weeks duration of productive cough and dyspnea. Midchest pain tonight. EXAM: PORTABLE CHEST 1 VIEW COMPARISON:  06/20/2016, 03/14/2016. FINDINGS: Loculated left pleural collection persists  although the pleural air component is reduced or resolved. Pleural calcifications persist bilaterally. No new areas of parenchymal lung opacity. Persistent mild opacities adjacent to the pleural collection. Prominent pulmonary arterial contours are unchanged. Hilar and mediastinal contours are otherwise unremarkable. IMPRESSION: No acute findings. There is unchanged chronic pleural collection on the left and chronic pleural calcifications bilaterally. Stable chronic enlargement of the pulmonary arteries consistent with pulmonary arterial hypertension. Electronically Signed   By: Andreas Newport M.D.   On: 11/05/2016 22:01    2D ECHO:   Disposition and Follow-up: Discharge Instructions    Diet - low sodium heart healthy    Complete by:  As directed    Increase activity slowly    Complete by:  As directed        DISPOSITION: home    [place]    Bensimhon, Daniel, MD. Schedule an appointment as soon as possible for a visit in 2 week(s).   Specialty:  Cardiology Contact information: [address] [place] [address] [phone]        [name], MD Follow up on 12/12/2016.   Specialty:  Cardiothoracic Surgery Why:  at 1:00PM for follow-up  Contact information: [place] [address] [phone]            Time spent on Discharge: 33mins   Signed:   RAI,RIPUDEEP M.D. [facility] 11/09/2016, 11:47 AM Pager: [phone]

## 2016-11-18 ENCOUNTER — Ambulatory Visit (HOSPITAL_COMMUNITY)
Admission: EM | Admit: 2016-11-18 | Discharge: 2016-11-18 | Disposition: A | Discharge status: Home / Self Care | Payer: BLUE CROSS/BLUE SHIELD

## 2016-12-05 ENCOUNTER — Ambulatory Visit (HOSPITAL_COMMUNITY)
Admission: RE | Admit: 2016-12-05 | Discharge: 2016-12-05 | Disposition: A | Discharge status: Home / Self Care | Payer: BLUE CROSS/BLUE SHIELD | Source: Ambulatory Visit | Attending: Internal Medicine | Admitting: Internal Medicine

## 2016-12-05 VITALS — BP 114/78 | HR 79 | Wt 93.4 lb

## 2016-12-05 DIAGNOSIS — R0602 Shortness of breath: Secondary | ICD-10-CM

## 2016-12-05 DIAGNOSIS — J869 Pyothorax without fistula: Secondary | ICD-10-CM | POA: Diagnosis not present

## 2016-12-05 DIAGNOSIS — Z79899 Other long term (current) drug therapy: Secondary | ICD-10-CM | POA: Insufficient documentation

## 2016-12-05 DIAGNOSIS — Z5189 Encounter for other specified aftercare: Secondary | ICD-10-CM | POA: Diagnosis not present

## 2016-12-05 DIAGNOSIS — J449 Chronic obstructive pulmonary disease, unspecified: Secondary | ICD-10-CM | POA: Insufficient documentation

## 2016-12-05 DIAGNOSIS — Z7982 Long term (current) use of aspirin: Secondary | ICD-10-CM | POA: Diagnosis not present

## 2016-12-05 DIAGNOSIS — I272 Pulmonary hypertension, unspecified: Secondary | ICD-10-CM | POA: Diagnosis not present

## 2016-12-05 DIAGNOSIS — Z9889 Other specified postprocedural states: Secondary | ICD-10-CM | POA: Insufficient documentation

## 2016-12-05 NOTE — Progress Notes (Addendum)
Advanced Heart Failure Medication Review by a Pharmacist  Does the patient  feel that his/her medications are working for him/her?  yes  Has the patient been experiencing any side effects to the medications prescribed?  no  Does the patient measure his/her own blood pressure or blood glucose at home?  no   Does the patient have any problems obtaining medications due to transportation or finances?   no  Understanding of regimen: good Understanding of indications: good Potential of compliance: good Patient understands to avoid NSAIDs. Patient understands to avoid decongestants.  Issues to address at subsequent visits: None   Pharmacist comments: [name] [name] is a pleasant 56 yo M presenting without a medication list but with good recall of his regimen. He reports good compliance with his regimen and did not have any specific medication-related questions or concerns for me at this time. I did call [facility] who states that he was never shipped the [place] because he never answered the phone to verify shipping. Will provide him with their number to call and get it shipped.   [name], PharmD, BCPS, CPP Clinical Pharmacist Pager: [phone] Phone: [phone] 12/05/2016 11:49 AM      Time with patient: 10 minutes Preparation and documentation time: 2 minutes Total time: 12 minutes

## 2016-12-05 NOTE — Patient Instructions (Signed)
Please call [facility]- call to [place] [phone]   Your physician recommends that you schedule a follow-up appointment in: 2 months with Dr Haroldine Laws

## 2016-12-05 NOTE — Progress Notes (Signed)
ADVANCED HF CLINIC NOTE  Patient ID: [name] [name], male   DOB: [birth year], 56 y.o.   MRN: [id number] PCP: Primary HF Cardiologist: Dr Vaughan Browner  HPI: [name] [name] is a 56 y.o. [place] male with PMH of urolithiasis, HTN, chronic empyema s/p remote lung decortication and PAH.  He was admitted to [facility] in 09/2015 for empyema, underwent a VATs procedure and chest tube placement and PICC line placement for outpatient IV ABX. He was non compliant with Cardiothoracic surgery and other provider visits and continued to go to the ED for care, where his chest tube was eventually removed in 10/2015. He was readmitted in 3/17 with hemoptysis and recurrent empyema. Chest tube placed. Culture + methicillin resistant CN Staph. Treated with vanc. Echo with evidence of PAH started on sildenafil.   Admitted 1/2 through 11/09/16  with pneumonia. Treated with antibiotics.   Today he returns for HF follow up. He has completed antibiotic course. Overall feeling ok.SOB with steps. Denies PND/Orthopnea. Uses oxygen at home. He has not started [place] but was prescribed adcirca in the fall last year. . Denies syncope/presyncope.   Marland Kitchen  RHC 8/17 (off sildenafil) RA = 3 RV = 59/6 PA = 62/23 (40) PCW = 6 Fick cardiac output/index = 4.2/2.9 PVR = 8.1 WU Ao sat = 93%  PA sat = 67%, 68%  RHC on 01/18/16 .  RA = 4 RV = 54/3/7 PA = 59/24 (37) PCW = 9 Fick cardiac output/index = 4.1/3.2 PVR = 6.8 WU FA sat = 95% PA sat = 68%, 68%  PFTs 02/16/16 FVC 1.63 (48%) FEV1 0.75 (28%) FEV1/FVC ratio 46% TLC 2.88 (58%) DLCO 10.3 (54%)  ECHO 11/2016: LVEF 60-65% Grade II DD peak PA pressure 56 mm hg.  Had echo 7/17. LVEF 55-60% MildlyD-shaped septum. RV dilated with normal function. No TR jet to measure PA pressures.  Echo 01/14/16 LVEF 0000000, septal diastolic and systolic flattening. Trivial MR, Mildly dilated RV with mild/mod reduced RV function. Mild TR, PA peak pressure 81 mm Hg.     ROS: All systems negative except as  listed in HPI, PMH and Problem List.  SH:  Social History   Social History  . Marital status: Single    Spouse name: N/A  . Number of children: N/A  . Years of education: N/A   Occupational History  . Not on file.   Social History Main Topics  . Smoking status: Never Smoker  . Smokeless tobacco: Never Used  . Alcohol use Yes  . Drug use: No  . Sexual activity: Not on file   Other Topics Concern  . Not on file   Social History Narrative  . No narrative on file    FH:  Family History  Problem Relation Age of Onset  . Hypertension Other     Past Medical History:  Diagnosis Date  . Hypertension   . Kidney stones   . Pulmonary hypertension     Current Outpatient Prescriptions  Medication Sig Dispense Refill  . albuterol (PROVENTIL HFA;VENTOLIN HFA) 108 (90 Base) MCG/ACT inhaler Inhale 2 puffs into the lungs every 6 (six) hours as needed for wheezing or shortness of breath. 1 Inhaler 2  . aspirin EC 325 MG EC tablet Take 1 tablet (325 mg total) by mouth daily. 30 tablet 3  . guaiFENesin-dextromethorphan (ROBITUSSIN DM) 100-10 MG/5ML syrup Take 5 mLs by mouth every 4 (four) hours as needed for cough. 118 mL 0  . OXYGEN 4lpm 24/7    . tadalafil, PAH, (ADCIRCA)  20 MG tablet Take 1 tablet (20 mg total) by mouth daily. 30 tablet 11   No current facility-administered medications for this encounter.     Vitals:   12/05/16 1141  BP: 114/78  Pulse: 79  SpO2: 90%  Weight: 93 lb 6.4 oz (42.4 kg)   Wt Readings from Last 3 Encounters:  12/05/16 93 lb 6.4 oz (42.4 kg)  11/09/16 89 lb 14.4 oz (40.8 kg)  07/29/16 93 lb (42.2 kg)     PHYSICAL EXAM: General:  Thin, NAD.  HEENT: normal Neck: supple. JVP 5-6. Carotids 2+ bilaterally; no bruits. No thyromegaly or nodule noted.  Cor: PMI normal. RRR. No M/G/R. Normal S1, Prominent S2, minimal TR Lungs: CTAB Abdomen: soft, non-tender, non-distended, no HSM. No bruits or masses. +BS  Extremities: no cyanosis, rash, or edema.  + clubbing  Neuro: alert & orientedx3, cranial nerves grossly intact. Moves all 4 extremities w/o difficulty. Affect pleasant.  ASSESSMENT & PLAN: 1. Pulmonary HTN with cor pulmonale - Think primarily Group 3 with likely component of Group 1  - PFTs with DLCO 54%, - Echo 7/17 with persistent RV strain. [place] on 8/17 with mild to moderate PAH.  ECHO 11/2016 EF 60-65% Peak PA pressure 56 mm hg RV moderately dilated. - ANA negative, ANCA negative, RF negative. Recent HIV/HCV test negative - VQ scan low probability. - LE dopplers negative for DVT.  - Suspect mostly WHO Group 3 disease.  In the fall he was prescribed adcirca however he never started. We contacted the pharmacy and it has been approved but he has to call and verify shipping. We provided with specialty pharmacy number.  I have asked him to call today.  Only using oxygen at home but needs to use when he walks.   2. Recurrent left-sided empyema s/p drainage 3. COPD - Severe by PFTs 02/16/16 FVC 1.63 (48%), FEV1 0.75 (28%), FEV1/FVC ratio 46%, TLC 2.88 (58%),  DLCO 10.3 (54%) -   Follow up in 6 weeks with Dr Haroldine Laws will need 6MW at that time after he has been on adcirca for at least a month.  Karson Chicas NP-C  12:20 PM

## 2016-12-11 ENCOUNTER — Other Ambulatory Visit: Payer: Self-pay | Admitting: Cardiothoracic Surgery

## 2016-12-11 DIAGNOSIS — J90 Pleural effusion, not elsewhere classified: Secondary | ICD-10-CM

## 2016-12-12 ENCOUNTER — Encounter: Payer: Self-pay | Admitting: Cardiothoracic Surgery

## 2016-12-12 ENCOUNTER — Ambulatory Visit (INDEPENDENT_AMBULATORY_CARE_PROVIDER_SITE_OTHER): Payer: BLUE CROSS/BLUE SHIELD | Admitting: Cardiothoracic Surgery

## 2016-12-12 ENCOUNTER — Ambulatory Visit
Admission: RE | Admit: 2016-12-12 | Discharge: 2016-12-12 | Disposition: A | Discharge status: Home / Self Care | Payer: BLUE CROSS/BLUE SHIELD | Source: Ambulatory Visit | Attending: Cardiothoracic Surgery | Admitting: Cardiothoracic Surgery

## 2016-12-12 VITALS — BP 120/86 | HR 85 | Resp 16 | Ht 60.0 in | Wt 95.0 lb

## 2016-12-12 DIAGNOSIS — J90 Pleural effusion, not elsewhere classified: Secondary | ICD-10-CM

## 2016-12-12 DIAGNOSIS — Z09 Encounter for follow-up examination after completed treatment for conditions other than malignant neoplasm: Secondary | ICD-10-CM | POA: Diagnosis not present

## 2016-12-12 DIAGNOSIS — J869 Pyothorax without fistula: Secondary | ICD-10-CM | POA: Diagnosis not present

## 2016-12-12 NOTE — Progress Notes (Signed)
[address]       [place] [address]             [phone]      Holman Hallstrom Butts Medical Record [id number] Date of Birth: Apr 30, 1961  Referring: Lacretia Leigh, MD Primary Care: No PCP Per Patient  Chief Complaint:     FOLLOW UP  History of Present Illness:     Patient returns to the office stay afterhospitalization for hypoxia and recurrent left empyema,  He denies any fever or chills. His cough has improved.   He is followed in heart failure clinic for evaluation of his enlarged pulmonary arteries and question of pulmonary hypertension.   He comes into the office today with a history of being on oxygen but notes that he seldom uses it, and when he does sits primarily at night He denies any fever chills or night sweats. Overall feels much better than he has for many months  Past Surgical History:  Procedure Laterality Date  . CARDIAC CATHETERIZATION N/A 01/18/2016   Procedure: Right Heart Cath;  Surgeon: Jolaine Artist, MD;  Location: [facility];  Service: Cardiovascular;  Laterality: N/A;  . CARDIAC CATHETERIZATION N/A 06/24/2016   Procedure: Right Heart Cath;  Surgeon: Jolaine Artist, MD;  Location: [facility];  Service: Cardiovascular;  Laterality: N/A;  . KIDNEY SURGERY    . VIDEO ASSISTED THORACOSCOPY (VATS)/DECORTICATION Left 2003  . VIDEO ASSISTED THORACOSCOPY (VATS)/DECORTICATION Left 10/02/2015   Procedure: VIDEO ASSISTED THORACOSCOPY (VATS)/DECORTICATION and drainage of chronic empyena;  Surgeon: [name], MD;  Location: [place] [place];  Service: Thoracic;  Laterality: Left;  Marland Kitchen VIDEO BRONCHOSCOPY N/A 10/02/2015   Procedure: VIDEO BRONCHOSCOPY;  Surgeon: [name], MD;  Location: [facility] OR;  Service: Thoracic;  Laterality: N/A;     Past Medical History:  Diagnosis Date  . Hypertension   . Kidney stones   . Pulmonary hypertension      History  Smoking Status  . Never Smoker  Smokeless Tobacco  . Never  Used    History  Alcohol Use  . Yes     No Known Allergies  Current Outpatient Prescriptions  Medication Sig Dispense Refill  . albuterol (PROVENTIL HFA;VENTOLIN HFA) 108 (90 Base) MCG/ACT inhaler Inhale 2 puffs into the lungs every 6 (six) hours as needed for wheezing or shortness of breath. 1 Inhaler 2  . aspirin EC 325 MG EC tablet Take 1 tablet (325 mg total) by mouth daily. 30 tablet 3  . guaiFENesin-dextromethorphan (ROBITUSSIN DM) 100-10 MG/5ML syrup Take 5 mLs by mouth every 4 (four) hours as needed for cough. 118 mL 0  . OXYGEN 4lpm BEDTIME ONLY    . tadalafil, PAH, (ADCIRCA) 20 MG tablet Take 1 tablet (20 mg total) by mouth daily. (Patient not taking: Reported on 12/05/2016) 30 tablet 11   No current facility-administered medications for this visit.        Physical Exam: BP 120/86 (BP Location: Right Arm, Patient Position: Sitting, Cuff Size: Normal)   Pulse 85   Resp 16   Ht 5' (1.524 m)   Wt 95 lb (43.1 kg)   SpO2 95% Comment: RA  BMI 18.55 kg/m   General appearance: alert and cooperative, overall the patient looks much better, breathing easily, is not dyspneic walking around the office Neurologic: intact Heart: regular rate and rhythm, S1, S2 normal, no murmur, click, rub or gallop Lungs: diminished breath sounds LLL Abdomen: soft, non-tender;  bowel sounds normal; no masses,  no organomegaly Extremities: extremities normal, atraumatic, no cyanosis or edema and Homans sign is negative, no sign of DVT Wound: All chest tube sites are well-healed   Diagnostic Studies & Laboratory data:     Recent Radiology Findings:   Dg Chest 2 View  Result Date: 12/12/2016 CLINICAL DATA:  Status post video-assisted thoracic surgery in November 2016 with decortication and drainage of a chronic empyema. EXAM: CHEST  2 VIEW COMPARISON:  Chest x-ray of November 05, 2016 and chest CT scan of November 06, 2016. FINDINGS: There is persistent pleural fluid and pleural thickening with  pleural space air along the mid and lower left lateral thoracic wall middle changed from the previous study. There is stable left apical pleural thickening. The right lung is well-expanded. There are stable right-sided pleural calcifications. The heart and pulmonary vascularity are normal. The observed bony thorax exhibits no acute abnormality. IMPRESSION: Persistent loculated fluid and air peripherally in the left pleural space not significantly changed since the study of November 05, 2016. Stable pleural calcifications on the right. No CHF nor acute pneumonia. Electronically Signed   By: David  Martinique M.D.   On: 12/12/2016 13:20      Recent Lab Findings: Lab Results  Component Value Date   WBC 6.4 11/09/2016   HGB 13.9 11/09/2016   HCT 41.8 11/09/2016   PLT 161 11/09/2016   GLUCOSE 87 11/09/2016   ALT 22 11/05/2016   AST 31 11/05/2016   NA 136 11/09/2016   K 4.6 11/09/2016   CL 97 (L) 11/09/2016   CREATININE 0.79 11/09/2016   BUN 10 11/09/2016   CO2 29 11/09/2016   INR 1.15 06/24/2016      Assessment / Plan:      Patient seems improved from his previous visit, He Wants to go back working full-time , and allowed him to do this but told him to avoid lifting over 25 pounds  and strenuous work. His chest x-ray is stable, without signs or symptoms of recurrent infection We'll plan to see the patient back in 6 months with a follow-up chest x-ray.    [name] MD      [address] [place] [address] Office [phone]   Beeper [phone]  12/12/2016 1:52 PM

## 2016-12-19 ENCOUNTER — Encounter: Payer: BLUE CROSS/BLUE SHIELD | Admitting: Cardiothoracic Surgery

## 2017-01-07 ENCOUNTER — Telehealth (HOSPITAL_COMMUNITY): Payer: Self-pay

## 2017-01-07 NOTE — Telephone Encounter (Signed)
Left VM for patient to inform of completed disability paperwork. Paperwork will remain in [place] folder until patient returns our call as he requested to pick up paperwork personally. Paperwork due on 01/09/2017, will continue to try to reach patient by phone.  Renee Pain, RN

## 2017-01-09 ENCOUNTER — Telehealth (HOSPITAL_COMMUNITY): Payer: Self-pay | Admitting: Pharmacist

## 2017-01-09 NOTE — Telephone Encounter (Signed)
Mr. Mazo arrived in clinic today with his interpreter to ask about shipment of his Adcirca. I explained to him that this needs to come from [facility] pharmacy and he needs to call them to get it shipped to his home every month. He asked if I could help with this so I have set up his first shipment for tomorrow which he will need to sign for. He has also given me permission to speak with [facility] to give authorization for future fills.   [name], PharmD, BCPS, CPP Clinical Pharmacist Pager: [phone] Phone: [phone] 01/09/2017 11:13 AM

## 2017-01-09 NOTE — Telephone Encounter (Signed)
Paperwork picked up by patient for disability claim. Copy scanned into patient's electronic medical records.  Renee Pain, RN

## 2017-01-27 ENCOUNTER — Ambulatory Visit (HOSPITAL_COMMUNITY)
Admission: RE | Admit: 2017-01-27 | Discharge: 2017-01-27 | Disposition: A | Discharge status: Home / Self Care | Payer: BLUE CROSS/BLUE SHIELD | Source: Ambulatory Visit | Attending: Internal Medicine | Admitting: Internal Medicine

## 2017-01-27 ENCOUNTER — Encounter (HOSPITAL_COMMUNITY): Payer: Self-pay | Admitting: Internal Medicine

## 2017-01-27 VITALS — BP 124/84 | HR 85 | Wt 95.5 lb

## 2017-01-27 DIAGNOSIS — I272 Pulmonary hypertension, unspecified: Secondary | ICD-10-CM | POA: Insufficient documentation

## 2017-01-27 DIAGNOSIS — Z79899 Other long term (current) drug therapy: Secondary | ICD-10-CM | POA: Insufficient documentation

## 2017-01-27 DIAGNOSIS — J449 Chronic obstructive pulmonary disease, unspecified: Secondary | ICD-10-CM | POA: Insufficient documentation

## 2017-01-27 DIAGNOSIS — Z7982 Long term (current) use of aspirin: Secondary | ICD-10-CM | POA: Diagnosis not present

## 2017-01-27 DIAGNOSIS — I10 Essential (primary) hypertension: Secondary | ICD-10-CM | POA: Insufficient documentation

## 2017-01-27 DIAGNOSIS — Z5189 Encounter for other specified aftercare: Secondary | ICD-10-CM | POA: Diagnosis present

## 2017-01-27 DIAGNOSIS — Z87442 Personal history of urinary calculi: Secondary | ICD-10-CM | POA: Insufficient documentation

## 2017-01-27 DIAGNOSIS — J869 Pyothorax without fistula: Secondary | ICD-10-CM

## 2017-01-27 NOTE — Patient Instructions (Signed)
Your physician recommends that you schedule a follow-up appointment in: 2 months with echocardiogram  

## 2017-01-27 NOTE — Progress Notes (Signed)
ADVANCED HF CLINIC NOTE  Patient ID: [name] [name], male   DOB: 12-21-1960, 56 y.o.   MRN: [id number] PCP: Primary HF Cardiologist: Dr Vaughan Browner  HPI: [name] [name] is a 56 y.o. [place] male with PMH of urolithiasis, HTN, chronic empyema s/p remote lung decortication and PAH.  He was admitted to [facility] in 09/2015 for empyema, underwent a VATs procedure and chest tube placement and PICC line placement for outpatient IV ABX. He was non compliant with Cardiothoracic surgery and other provider visits and continued to go to the ED for care, where his chest tube was eventually removed in 10/2015. He was readmitted in 3/17 with hemoptysis and recurrent empyema. Chest tube placed. Culture + methicillin resistant CN Staph. Treated with vanc. Echo with evidence of PAH started on sildenafil.   Admitted 1/2 through 11/09/16  with pneumonia. Treated with antibiotics.   Presents today for HF follow up. History taken with interpreter present. Says he has been feeling well, only occasional SOB if he tries to do too much. Has been taking his medications with compliance. Denies dizziness, no syncope or presyncope. Denies lower extremity edema, presyncope. Denies cough. No CP.  Marland Kitchen  RHC 8/17 (off sildenafil) RA = 3 RV = 59/6 PA = 62/23 (40) PCW = 6 Fick cardiac output/index = 4.2/2.9 PVR = 8.1 WU Ao sat = 93%  PA sat = 67%, 68%  RHC on 01/18/16 .  RA = 4 RV = 54/3/7 PA = 59/24 (37) PCW = 9 Fick cardiac output/index = 4.1/3.2 PVR = 6.8 WU FA sat = 95% PA sat = 68%, 68%  PFTs 02/16/16 FVC 1.63 (48%) FEV1 0.75 (28%) FEV1/FVC ratio 46% TLC 2.88 (58%) DLCO 10.3 (54%)  ECHO 11/2016: LVEF 60-65% Grade II DD peak PA pressure 56 mm hg.  Had echo 7/17. LVEF 55-60% MildlyD-shaped septum. RV dilated with normal function. No TR jet to measure PA pressures.  Echo 01/14/16 LVEF 59-56%, septal diastolic and systolic flattening. Trivial MR, Mildly dilated RV with mild/mod reduced RV function. Mild TR, PA peak pressure 81 mm  Hg.     ROS: All systems negative except as listed in HPI, PMH and Problem List.  SH:  Social History   Social History  . Marital status: Single    Spouse name: N/A  . Number of children: N/A  . Years of education: N/A   Occupational History  . Not on file.   Social History Main Topics  . Smoking status: Never Smoker  . Smokeless tobacco: Never Used  . Alcohol use Yes  . Drug use: No  . Sexual activity: Not on file   Other Topics Concern  . Not on file   Social History Narrative  . No narrative on file    FH:  Family History  Problem Relation Age of Onset  . Hypertension Other     Past Medical History:  Diagnosis Date  . Hypertension   . Kidney stones   . Pulmonary hypertension     Current Outpatient Prescriptions  Medication Sig Dispense Refill  . albuterol (PROVENTIL HFA;VENTOLIN HFA) 108 (90 Base) MCG/ACT inhaler Inhale 2 puffs into the lungs every 6 (six) hours as needed for wheezing or shortness of breath. 1 Inhaler 2  . aspirin EC 325 MG EC tablet Take 1 tablet (325 mg total) by mouth daily. 30 tablet 3  . guaiFENesin-dextromethorphan (ROBITUSSIN DM) 100-10 MG/5ML syrup Take 5 mLs by mouth every 4 (four) hours as needed for cough. 118 mL 0  . tadalafil, PAH, (  ADCIRCA) 20 MG tablet Take 1 tablet (20 mg total) by mouth daily. 30 tablet 11  . OXYGEN 4lpm BEDTIME ONLY     No current facility-administered medications for this encounter.     Vitals:   01/27/17 1440  BP: 124/84  Pulse: 85  SpO2: 96%  Weight: 95 lb 8 oz (43.3 kg)   Wt Readings from Last 3 Encounters:  01/27/17 95 lb 8 oz (43.3 kg)  12/12/16 95 lb (43.1 kg)  12/05/16 93 lb 6.4 oz (42.4 kg)     PHYSICAL EXAM: General:  Thin male in NAD.  HEENT: normal Neck: supple. No JVP. Carotids 2+ bilaterally; no bruits. No thyromegaly or nodule noted.  Cor: PMI normal. Regular rate and rhythm. No murmurs, rubs.  Lungs: Clear in bilateral upper lobes, Dull in LLL.  Abdomen: soft, non-tender,  non-distended, no HSM. No bruits or masses. +BS  Extremities: no cyanosis, rash, or edema. + clubbing.  Neuro: alert & orientedx3, cranial nerves grossly intact. Moves all 4 extremities w/o difficulty. Affect pleasant.  ASSESSMENT & PLAN: 1. Pulmonary HTN with cor pulmonale - Think primarily Group 3 with likely component of Group 1  - PFTs with DLCO 54%, - Echo 7/17 with persistent RV strain. [place] on 8/17 with mild to moderate PAH.  ECHO 11/2016 EF 60-65% Peak PA pressure 56 mm hg RV moderately dilated. - ANA negative, ANCA negative, RF negative. Recent HIV/HCV test negative - VQ scan low probability for PE.  - LE dopplers negative for DVT.  - Suspect mostly WHO Group 3 disease.  In the fall he was prescribed adcirca however he never started. We contacted the pharmacy, and he was started on Adcirca in Feb. 2018. He thinks it has helped his symptoms somewhat.  2. Recurrent left-sided empyema s/p drainage 3. COPD - Severe by PFTs 02/16/16 FVC 1.63 (48%), FEV1 0.75 (28%), FEV1/FVC ratio 46%, TLC 2.88 (58%),  DLCO 10.3 (54%)   Follow up in 2 months with Echo. He will stay out of work until we see him back in 2 months.   Arbutus Leas NP-C  3:23 PM  Patient seen and examined with the above-signed Advanced Practice Provider and/or Housestaff. I personally reviewed laboratory data, imaging studies and relevant notes. I independently examined the patient and formulated the important aspects of the plan. I have edited the note to reflect any of my changes or salient points. I have personally discussed the plan with the patient and/or family.  He is improving with tadalafil. Likely NYHA II-III. Volume status ok. Will continue current therapy. F/u 2 months with echo. Will need to repeat PFTs in near future as well. He will remain out of work for now.   Glori Bickers, MD  10:27 PM

## 2017-02-04 ENCOUNTER — Encounter (HOSPITAL_COMMUNITY): Payer: Self-pay | Admitting: *Deleted

## 2017-02-04 NOTE — Progress Notes (Signed)
Patient dropped off Disability form on 01/29/2017 and requested for completed form and requested medical records be faxed and copy left for him to pick up.  Forms completed and requested medical records faxed to [employer] [facility] @ [phone] today.  I called patient and he is aware and he will pick up his copy at the front desk tomorrow. Completed forms will be scanned to patient's electronic medical record.

## 2017-03-06 ENCOUNTER — Encounter (HOSPITAL_COMMUNITY): Payer: Self-pay | Admitting: *Deleted

## 2017-03-06 NOTE — Progress Notes (Signed)
Received medical records request from [employer] [facility] for patient's long-term disability approval.  Requested all medical records from 10-04-2016 to Present.    All requested documents faxed today to [phone] as requested.  Original request will be scanned to patient's electronic medical record.

## 2017-03-28 ENCOUNTER — Inpatient Hospital Stay (HOSPITAL_COMMUNITY)
Admission: RE | Admit: 2017-03-28 | Payer: BLUE CROSS/BLUE SHIELD | Source: Ambulatory Visit | Admitting: Internal Medicine

## 2017-03-28 ENCOUNTER — Ambulatory Visit (HOSPITAL_COMMUNITY): Admission: RE | Admit: 2017-03-28 | Payer: Self-pay | Source: Ambulatory Visit

## 2017-04-01 ENCOUNTER — Telehealth (HOSPITAL_COMMUNITY): Payer: Self-pay | Admitting: Pharmacist

## 2017-04-01 NOTE — Telephone Encounter (Signed)
Received a call from [place] that [name] [name] insurance is no longer active. I have called and left a VM for him to call me back with new insurance information.   [name], PharmD, BCPS, CPP Clinical Pharmacist Pager: [phone] Phone: [phone] 04/01/2017 10:05 AM

## 2017-04-03 ENCOUNTER — Encounter (HOSPITAL_COMMUNITY): Payer: Self-pay | Admitting: *Deleted

## 2017-04-03 NOTE — Progress Notes (Signed)
Received medical record request from [facility] for patient on Mar 24, 2017.  Requested records and completed form faxed today to [phone].  Original request will be scanned to patient's electronic medical record.

## 2017-05-27 ENCOUNTER — Telehealth (HOSPITAL_COMMUNITY): Payer: Self-pay | Admitting: *Deleted

## 2017-05-27 NOTE — Telephone Encounter (Signed)
I have reached out to patient several times regarding his missed appointment/echo in May but had not received a call back.  This morning I reached patient and have rescheduled his echo and follow up appointment.  Due to language barrier, Arbutus Leas has mailed him a letter stating when his appointment are.  I called patient back and made him aware but unsure if he understands me or not.   I have disability paperwork that he is requesting me to fill out.  I explained to him that I'm unable to complete work until he has his echo and follow up.

## 2017-05-29 ENCOUNTER — Telehealth (HOSPITAL_COMMUNITY): Payer: Self-pay | Admitting: *Deleted

## 2017-05-29 NOTE — Telephone Encounter (Signed)
Patient has been scheduled for Echo and follow up on July 01, 2017.  This was rescheduled from his missed appointment on Mar 28, 2017.  I called [employer] [facility] disability office and left detailed message that patient's forms will be completed after his August appointment is completed.

## 2017-06-11 ENCOUNTER — Other Ambulatory Visit: Payer: Self-pay | Admitting: Cardiothoracic Surgery

## 2017-06-11 DIAGNOSIS — J90 Pleural effusion, not elsewhere classified: Secondary | ICD-10-CM

## 2017-06-12 ENCOUNTER — Encounter: Payer: Self-pay | Admitting: Cardiothoracic Surgery

## 2017-06-12 ENCOUNTER — Ambulatory Visit (INDEPENDENT_AMBULATORY_CARE_PROVIDER_SITE_OTHER): Payer: Self-pay | Admitting: Cardiothoracic Surgery

## 2017-06-12 ENCOUNTER — Ambulatory Visit
Admission: RE | Admit: 2017-06-12 | Discharge: 2017-06-12 | Disposition: A | Discharge status: Home / Self Care | Payer: No Typology Code available for payment source | Source: Ambulatory Visit | Attending: Cardiothoracic Surgery | Admitting: Cardiothoracic Surgery

## 2017-06-12 VITALS — BP 151/100 | HR 86 | Resp 16 | Ht 60.0 in | Wt 88.8 lb

## 2017-06-12 DIAGNOSIS — Z09 Encounter for follow-up examination after completed treatment for conditions other than malignant neoplasm: Secondary | ICD-10-CM

## 2017-06-12 DIAGNOSIS — J90 Pleural effusion, not elsewhere classified: Secondary | ICD-10-CM

## 2017-06-12 DIAGNOSIS — J869 Pyothorax without fistula: Secondary | ICD-10-CM

## 2017-06-12 NOTE — Progress Notes (Signed)
Ed - We will chat with him and see if we can improve his compliance. Thanks for the note.

## 2017-06-12 NOTE — Progress Notes (Signed)
[address]       [place] [address]             [phone]      Ulrich Hegstrom [place] Medical Record #[id number] Date of Birth: 1961/10/01  Referring: Lacretia Leigh, MD Primary Care: Patient, No Pcp Per  Chief Complaint:     FOLLOW UP  History of Present Illness:     Patient returns today for follow-up visit and chest x-ray. He continues on intermittent use of home oxygen. Denies any fever or chills. Still gets short of breath with exertion but overall feels better than 6 months ago. 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    He is followed in heart failure clinic for evaluation of his enlarged pulmonary arteries and question of pulmonary hypertension.  He denies any fever chills or night sweats. He notes in the office today although on his medication list he has not takingTadalafil.     Past Surgical History:  Procedure Laterality Date  . CARDIAC CATHETERIZATION N/A 01/18/2016   Procedure: Right Heart Cath;  Surgeon: Jolaine Artist, MD;  Location: [facility];  Service: Cardiovascular;  Laterality: N/A;  . CARDIAC CATHETERIZATION N/A 06/24/2016   Procedure: Right Heart Cath;  Surgeon: Jolaine Artist, MD;  Location: [facility];  Service: Cardiovascular;  Laterality: N/A;  . KIDNEY SURGERY    . VIDEO ASSISTED THORACOSCOPY (VATS)/DECORTICATION Left 2003  . VIDEO ASSISTED THORACOSCOPY (VATS)/DECORTICATION Left 10/02/2015   Procedure: VIDEO ASSISTED THORACOSCOPY (VATS)/DECORTICATION and drainage of chronic empyena;  Surgeon: [name], MD;  Location: [facility];  Service: Thoracic;  Laterality: Left;  Marland Kitchen VIDEO BRONCHOSCOPY N/A 10/02/2015   Procedure: VIDEO BRONCHOSCOPY;  Surgeon: [name], MD;  Location: [facility];  Service: Thoracic;  Laterality: N/A;     Past Medical History:  Diagnosis Date  . Hypertension   . Kidney stones   . Pulmonary hypertension (HCC)       History  Smoking Status  . Never Smoker  Smokeless Tobacco  . Never Used    History  Alcohol Use  . Yes     No Known Allergies  Current Outpatient Prescriptions  Medication Sig Dispense Refill  . aspirin EC 325 MG EC tablet Take 1 tablet (325 mg total) by mouth daily. 30 tablet 3  . OXYGEN 4lpm BEDTIME ONLY    . albuterol (PROVENTIL HFA;VENTOLIN HFA) 108 (90 Base) MCG/ACT inhaler Inhale 2 puffs into the lungs every 6 (six) hours as needed for wheezing or shortness of breath. (Patient not taking: Reported on 06/12/2017) 1 Inhaler 2  . guaiFENesin-dextromethorphan (ROBITUSSIN DM) 100-10 MG/5ML syrup Take 5 mLs by mouth every 4 (four) hours as needed for cough. (Patient not taking: Reported on 06/12/2017) 118 mL 0  . tadalafil, PAH, (ADCIRCA) 20 MG tablet Take 1 tablet (20 mg total) by mouth daily. (Patient not taking: Reported on 06/12/2017) 30 tablet 11   No current facility-administered medications for this visit.        Physical Exam: BP (!) 151/100 (BP Location: Right Arm, Patient Position: Sitting, Cuff Size: Normal)   Pulse 86   Resp 16   Ht 5' (1.524 m)   Wt 88 lb 12.8 oz (40.3 kg)   SpO2 93% Comment: RA  BMI 17.34 kg/m   Physical Exam  Constitutional: He is oriented to person, place, and time. No distress.  Eyes: Right  eye exhibits no discharge. Left eye exhibits no discharge. No scleral icterus.  Neck: No JVD present. No tracheal deviation present. No thyromegaly present.  Cardiovascular: Normal rate and regular rhythm.  Exam reveals no gallop and no friction rub.   No murmur heard. Respiratory: No respiratory distress. He has no wheezes. He has no rales. He exhibits no tenderness.  GI: Soft. Bowel sounds are normal.  Musculoskeletal: He exhibits no edema or deformity.  Lymphadenopathy:    He has no cervical adenopathy.  Neurological: He is alert and oriented to person, place, and time.  Skin: He is not diaphoretic.    Diagnostic Studies & Laboratory  data:     Recent Radiology Findings:   Dg Chest 2 View  Result Date: 06/12/2017 CLINICAL DATA:  History of VATS in 2016, pleural effusion, chronic empyema EXAM: CHEST  2 VIEW COMPARISON:  Chest x-ray of 12/12/2016 and CT chest of 11/06/2016 FINDINGS: The chronic appearing left hydropneumothorax appears unchanged in size. Chronic change at the right lung base with scarring is stable. Slight blunting of the right costophrenic angle also is stable. No new infiltrate or enlarging effusion is seen. Mediastinal and hilar contours are unremarkable with prominent pulmonary artery segments in this patient with a history of pulmonary arterial hypertension. Heart size is stable. No bony abnormality is seen. IMPRESSION: Stable chronic changes bilaterally with no change in the left hydropneumothorax. Electronically Signed   By: Ivar Drape M.D.   On: 06/12/2017 10:45      Recent Lab Findings: Lab Results  Component Value Date   WBC 6.4 11/09/2016   HGB 13.9 11/09/2016   HCT 41.8 11/09/2016   PLT 161 11/09/2016   GLUCOSE 87 11/09/2016   ALT 22 11/05/2016   AST 31 11/05/2016   NA 136 11/09/2016   K 4.6 11/09/2016   CL 97 (L) 11/09/2016   CREATININE 0.79 11/09/2016   BUN 10 11/09/2016   CO2 29 11/09/2016   INR 1.15 06/24/2016      Assessment / Plan:    Patient remained stable since last seen 6 months ago, but will need help with heart fairlure drugs, says he is only taking asa .  He has follow-up echocardiogram and heart failure visit later in August, will need to address his heart failure antihypertensive medications,   Patient's chest x-ray today is stable with chronic pleural scarring on the left . We'll plan to see him back in 6 months with a follow-up chest x-ray   [name] MD      [address] [place] [address] Office [phone]   Beeper [phone]  06/12/2017 11:00 AM

## 2017-07-01 ENCOUNTER — Encounter (HOSPITAL_COMMUNITY): Payer: Self-pay | Admitting: Internal Medicine

## 2017-07-01 ENCOUNTER — Ambulatory Visit (HOSPITAL_COMMUNITY)
Admission: RE | Admit: 2017-07-01 | Discharge: 2017-07-01 | Disposition: A | Discharge status: Home / Self Care | Payer: Self-pay | Source: Ambulatory Visit | Attending: Internal Medicine | Admitting: Internal Medicine

## 2017-07-01 ENCOUNTER — Ambulatory Visit (HOSPITAL_BASED_OUTPATIENT_CLINIC_OR_DEPARTMENT_OTHER)
Admission: RE | Admit: 2017-07-01 | Discharge: 2017-07-01 | Disposition: A | Discharge status: Home / Self Care | Payer: Self-pay | Source: Ambulatory Visit | Attending: Internal Medicine | Admitting: Internal Medicine

## 2017-07-01 VITALS — BP 137/88 | HR 94 | Wt 90.8 lb

## 2017-07-01 DIAGNOSIS — Z87442 Personal history of urinary calculi: Secondary | ICD-10-CM | POA: Insufficient documentation

## 2017-07-01 DIAGNOSIS — I272 Pulmonary hypertension, unspecified: Secondary | ICD-10-CM

## 2017-07-01 DIAGNOSIS — J449 Chronic obstructive pulmonary disease, unspecified: Secondary | ICD-10-CM | POA: Insufficient documentation

## 2017-07-01 DIAGNOSIS — I2729 Other secondary pulmonary hypertension: Secondary | ICD-10-CM | POA: Insufficient documentation

## 2017-07-01 DIAGNOSIS — Z7982 Long term (current) use of aspirin: Secondary | ICD-10-CM | POA: Insufficient documentation

## 2017-07-01 DIAGNOSIS — I10 Essential (primary) hypertension: Secondary | ICD-10-CM | POA: Insufficient documentation

## 2017-07-01 DIAGNOSIS — R0602 Shortness of breath: Secondary | ICD-10-CM

## 2017-07-01 DIAGNOSIS — Z452 Encounter for adjustment and management of vascular access device: Secondary | ICD-10-CM | POA: Insufficient documentation

## 2017-07-01 MED ORDER — TADALAFIL (PAH) 20 MG PO TABS: 20.0000 mg | ORAL_TABLET | Freq: Every day | ORAL | 11 refills | Status: DC

## 2017-07-01 NOTE — Patient Instructions (Signed)
Restart Adcirca 20 mg daily, this will come from [place] need to start wearing home oxygen, [facility] will get this set up for you  You have been referred to Dr Lake Bells at [facility]  Your physician recommends that you schedule a follow-up appointment in: 3 months

## 2017-07-01 NOTE — Progress Notes (Signed)
ADVANCED HF CLINIC NOTE  Patient ID: [name] [name], male   DOB: [birth year], 56 y.o.   MRN: [id number] PCP: Primary HF Cardiologist: Dr Vaughan Browner  HPI: [name] [name] is a 56 y.o. [place] male with PMH of urolithiasis, HTN, chronic empyema s/p remote lung decortication and PAH.  He was admitted to [facility] in 09/2015 for empyema, underwent a VATs procedure and chest tube placement and PICC line placement for outpatient IV ABX. He was non compliant with Cardiothoracic surgery and other provider visits and continued to go to the ED for care, where his chest tube was eventually removed in 10/2015. He was readmitted in 3/17 with hemoptysis and recurrent empyema. Chest tube placed. Culture + methicillin resistant CN Staph. Treated with vanc. Echo with evidence of PAH started on sildenafil.   Admitted 1/2 through 11/09/16  with pneumonia. Treated with antibiotics.   Presents today for HF follow up. History taken with interpreter present. Says he has good days and bad days. 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 Says get SOB with just slow walking. No edema, orthopnea or PND. + dry cough.  Saw Dr. Servando Snare last week. Chest x-ray stable with chronic pleural scarring on the left. No longer taking tadalfil. Not taking any medicines. Wearing O2 at night.  Echo today LVEF 60% RV dilated moderately to severe HK. Flattened septum. RVSP ~ 60-65. Personally reviewed  .  RHC 8/17 (off sildenafil) RA = 3 RV = 59/6 PA = 62/23 (40) PCW = 6 Fick cardiac output/index = 4.2/2.9 PVR = 8.1 WU Ao sat = 93%  PA sat = 67%, 68%  RHC on 01/18/16 .  RA = 4 RV = 54/3/7 PA = 59/24 (37) PCW = 9 Fick cardiac output/index = 4.1/3.2 PVR = 6.8 WU FA sat = 95% PA sat = 68%, 68%  PFTs 02/16/16 FVC 1.63 (48%) FEV1 0.75 (28%) FEV1/FVC ratio 46% TLC 2.88 (58%) DLCO 10.3 (54%)  ECHO 11/2016: LVEF 60-65% Grade II DD peak PA pressure 56 mm hg.  Had echo 7/17. LVEF 55-60% MildlyD-shaped septum. RV dilated with normal  function. No TR jet to measure PA pressures.  Echo 01/14/16 LVEF 25-42%, septal diastolic and systolic flattening. Trivial MR, Mildly dilated RV with mild/mod reduced RV function. Mild TR, PA peak pressure 81 mm Hg.     ROS: All systems negative except as listed in HPI, PMH and Problem List.  SH:  Social History   Social History  . Marital status: Single    Spouse name: N/A  . Number of children: N/A  . Years of education: N/A   Occupational History  . Not on file.   Social History Main Topics  . Smoking status: Never Smoker  . Smokeless tobacco: Never Used  . Alcohol use Yes  . Drug use: No  . Sexual activity: Not on file   Other Topics Concern  . Not on file   Social History Narrative  . No narrative on file    FH:  Family History  Problem Relation Age of Onset  . Hypertension Other     Past Medical History:  Diagnosis Date  . Hypertension   . Kidney stones   . Pulmonary hypertension ([place])     Current Outpatient Prescriptions  Medication Sig Dispense Refill  . albuterol (PROVENTIL HFA;VENTOLIN HFA) 108 (90 Base) MCG/ACT inhaler Inhale 2 puffs into the lungs every 6 (six) hours as needed for wheezing or shortness of breath. 1 Inhaler 2  . aspirin EC 325 MG EC  tablet Take 1 tablet (325 mg total) by mouth daily. 30 tablet 3  . OXYGEN 4lpm BEDTIME ONLY     No current facility-administered medications for this encounter.     Vitals:   07/01/17 1425  BP: 137/88  Pulse: 94  SpO2: 96%  Weight: 90 lb 12 oz (41.2 kg)   Wt Readings from Last 3 Encounters:  07/01/17 90 lb 12 oz (41.2 kg)  06/12/17 88 lb 12.8 oz (40.3 kg)  01/27/17 95 lb 8 oz (43.3 kg)     PHYSICAL EXAM: General:  Thin male in NAD.  HEENT: normal Neck: supple. JVP 7-8 Carotids 2+ bilat; no bruits. No lymphadenopathy or thryomegaly appreciated. Cor: PMI nondisplaced. Regular rate & rhythm. 2/6 TR loud P2. + RVlift  Lungs: clear decreased at left base Abdomen: soft, nontender, nondistended.  No hepatosplenomegaly. No bruits or masses. Good bowel sounds. Extremities: no cyanosis,rash, edema + clubbing Neuro: alert & orientedx3, cranial nerves grossly intact. moves all 4 extremities w/o difficulty. Affect pleasant  Hall walk destat to 85% up to 92% with 2L  ASSESSMENT & PLAN: 1. Pulmonary HTN with cor pulmonale - Primarily Group 3 with likely component of Group 1  - PFTs with DLCO 54%, - Echo today with persistent RV strain and RVSP 60-12mmHG. [place] on 8/17 with mild to moderate PAH. PVR 8.1 - ANA negative, ANCA negative, RF negative.  HIV/HCV test negative - VQ scan low probability for PE.  - LE dopplers negative for DVT.  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 Improved with Adcirca in past. Will restart. Watch closely for shunting and worsening hypoxia 2. Recurrent left-sided empyema s/p drainage 3. COPD - Severe by PFTs 02/16/16 FVC 1.63 (48%), FEV1 0.75 (28%), FEV1/FVC ratio 46%, TLC 2.88 (58%),  DLCO 10.3 (54%) - Continue nighttime O2. Get protable tank for ambualtory O2 - F/u Dr. Lake Bells.   Glori Bickers MD 2:47 PM

## 2017-07-01 NOTE — Addendum Note (Signed)
Encounter addended by: Scarlette Calico, RN on: 07/01/2017  3:12 PM<BR>    Actions taken: Pharmacy for encounter modified, Order list changed, Diagnosis association updated, Sign clinical note

## 2017-07-01 NOTE — Progress Notes (Signed)
SATURATION QUALIFICATIONS: (This note is used to comply with regulatory documentation for home oxygen)  Patient Saturations on Room Air at Rest = 96%  Patient Saturations on Room Air while Ambulating = 85%  Patient Saturations on 2 Liters of oxygen while Ambulating = 98%  Please briefly explain why patient needs home oxygen: hypoxia w/ambulation, pulmonary hypertension

## 2017-07-01 NOTE — Progress Notes (Signed)
  Echocardiogram 2D Echocardiogram has been performed.  [name] [name] 07/01/2017, 1:44 PM

## 2017-07-02 ENCOUNTER — Ambulatory Visit (INDEPENDENT_AMBULATORY_CARE_PROVIDER_SITE_OTHER): Payer: Self-pay | Admitting: Internal Medicine

## 2017-07-02 ENCOUNTER — Encounter: Payer: Self-pay | Admitting: Internal Medicine

## 2017-07-02 VITALS — BP 120/72 | HR 91 | Ht 60.0 in | Wt 91.0 lb

## 2017-07-02 DIAGNOSIS — I272 Pulmonary hypertension, unspecified: Secondary | ICD-10-CM

## 2017-07-02 DIAGNOSIS — J9611 Chronic respiratory failure with hypoxia: Secondary | ICD-10-CM

## 2017-07-02 DIAGNOSIS — J449 Chronic obstructive pulmonary disease, unspecified: Secondary | ICD-10-CM

## 2017-07-02 MED ORDER — UMECLIDINIUM-VILANTEROL 62.5-25 MCG/INH IN AEPB: 1.0000 | INHALATION_SPRAY | Freq: Every day | RESPIRATORY_TRACT | 11 refills | Status: DC

## 2017-07-02 MED ORDER — UMECLIDINIUM-VILANTEROL 62.5-25 MCG/INH IN AEPB: 1.0000 | INHALATION_SPRAY | Freq: Every day | RESPIRATORY_TRACT | 0 refills | Status: DC

## 2017-07-02 NOTE — Patient Instructions (Signed)
Anoro one click each am x 2 good drags    Next appt with Dr Milton Ferguson 30 min slot for second opinion re Pulmonary hypertension   You will need to establish with the community wellness center and let them know whether the Anoro helps your breathing

## 2017-07-02 NOTE — Congregational Nurse Program (Signed)
Congregational Nurse Program Note  Date of Encounter: 07/02/2017  Past Medical History: Past Medical History:  Diagnosis Date  . Hypertension   . Kidney stones   . Pulmonary hypertension ([place])     Encounter Details: Patient came to CN office asking for help applying for Medicaid.  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  Medicaid application completed and given to patient to mail .  Referred to Medication assistance program for help paying for medicine, primarily his Albuterol inhaler. Has an appointment today at [facility]  Jake Michaelis RN, Congregational Nurse [phone]     CNP Questionnaire - 07/02/17 1729      Patient Demographics   Is this a new or existing patient? New   Patient is considered a/an Refugee   Race Asian     Patient Assistance   Location of Patient Assistance Not Applicable   Patient's financial/insurance status Self-Pay (Uninsured)   Uninsured Patient ([facility]) No   Patient referred to apply for the following financial assistance Medicaid   Food insecurities addressed Not Applicable   Transportation assistance No   Assistance securing medications Yes   Type of Assistance Medication Assistance Program   Educational health offerings Other     Encounter Details   Primary purpose of visit Other   Was an Emergency Department visit averted? Not Applicable   Does patient have a medical provider? Yes   Patient referred to Not Applicable   Was a mental health screening completed? (GAINS tool) No   Does patient have dental issues? No   Does patient have vision issues? No   Does your patient have an abnormal blood pressure today? No   Since previous encounter, have you referred patient for abnormal blood pressure that resulted in a new diagnosis or medication change? No   Does your patient have an abnormal blood glucose today? No   Since previous  encounter, have you referred patient for abnormal blood glucose that resulted in a new diagnosis or medication change? No   Was there a life-saving intervention made? No

## 2017-07-02 NOTE — Progress Notes (Signed)
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 Pulmonary Consult    Referred by Dr. Jeffie Pollock for eval of pulmonary hypertension. Pt c/o occ DOE with lifting heavy objects. He also c/o hemoptysis x 3 months.   empyema dates back to at least 2003 with prev thoracotomy and re-VATS 10/02/2015 same finding with persistent doe > RHC 02/16/16 c/w PH @ 37 mean/with pcwp 9. Does fine walking flat at slow pace = MMRC2 = can't walk a nl pace on a flat grade s sob but does fine slow and flat eg [employer] all day long assoc with minimal cough prod slt blood streaked sputum x 3 m mostly in ams on no meds at all.  rec Please see patient coordinator before you leave today  to schedule 02 4lpm 24/7 - wear it as much as you can Try anoro 1 click each am  Please schedule a follow up office visit in 4 weeks, sooner if needed  Add needs alpha one screen next ov    04/23/2016  f/u ov/Laporcha Marchesi re: "COPD"  Never smoker    Chief Complaint  Patient presents with  . Follow-up    Breathing has improved but not back to baseline. He has only been using o2 "three times a day, it causes nose to bleed".  really can't use 02 daytime / noct 02 on 4lpm / really Not limited by breathing from desired activities  While on anoro. rec Continue Anoro one click each am  Continue 02 at bedtime but just use 2lpm for now as irritates nose less  Please remember to go to the lab   department downstairs for your tests - we will call you with the results when they are available.   07/02/2017  f/u ov/[name] [name] re: "COPD" never smoker/ [place]  Chief Complaint  Patient  presents with  . Pulmonary Consult    Referred by Dr. Jeffie Pollock. Pt c/o increased DOE and non prod cough for the past several months.   doe = MMRC3 = can't walk 100 yards even at a slow pace at a flat grade s stopping due to sob  - can't finish HT  Breathing was better on anoro ? Why stopped  On 02 just at hs x 4 / not during day > per Cards note 07/01/17 to start 02 ? When to use and adcirca but doesn't have it yet and has no insurance for either.  No obvious day to day or daytime variability or assoc excess/ purulent sputum or mucus plugs or hemoptysis or cp or chest tightness, subjective wheeze or overt sinus or hb symptoms. No unusual exp hx or h/o childhood pna/ asthma or knowledge of premature birth.  Sleeping ok without nocturnal  or early am exacerbation  of respiratory  c/o's or need for noct saba. Also denies any obvious fluctuation of symptoms with weather or environmental changes or other aggravating or alleviating factors except as outlined above   Current Medications, Allergies, Complete Past Medical History, Past Surgical History, Family History, and Social History were reviewed in [employer]  record.  ROS  The following are not active complaints unless bolded sore throat, dysphagia, dental problems, itching, sneezing,  nasal congestion or excess/ purulent secretions, ear ache,   fever, chills, sweats, unintended wt loss, classically pleuritic or exertional cp,  orthopnea pnd or leg swelling, presyncope, palpitations, abdominal pain, anorexia, nausea, vomiting, diarrhea  or change in bowel or bladder habits, change in stools or urine, dysuria,hematuria,  rash, arthralgias, visual complaints, headache, numbness, weakness or ataxia or problems with walking or coordination,  change in mood/affect or memory.                  Objective:   Physical Exam    thin amb chronically ill vietnamese male nad / speaks broken English  07/03/2017        89   04/23/2016        98   03/26/16 94 lb 3.2 oz (42.729 kg)  03/14/16 92 lb (41.731 kg)  03/11/16 92 lb 8 oz (41.958 kg)    Vital signs reviewed - Note on arrival 02 sats  94% on RA     HEENT: nl dentition, turbinates, and oropharynx. Nl external ear canals without cough reflex   NECK :  without JVD/Nodes/TM/ nl carotid upstrokes bilaterally   LUNGS: no acc muscle use,  slt barrel shaped chest/ surg scars on L base  where has bronchial changes on auscultation.   CV:  RRR  no s3 or murmur  - def  increase in P2, no edema   ABD:  soft and nontender with nl inspiratory excursion in the supine position. No bruits or organomegaly, bowel sounds nl  MS:  Nl gait/ ext warm without deformities, calf tenderness, cyanosis or clubbing No obvious joint restrictions   SKIN: warm and dry without lesions    NEURO:  alert, approp, nl sensorium with  no motor deficits     I personally reviewed images and agree with radiology impression as follows:  CXR:   06/12/17 Stable chronic changes bilaterally with no change in the left hydropneumothorax.     Assessment:

## 2017-07-03 NOTE — Assessment & Plan Note (Signed)
pfts 02/16/16  fev1  0.74 (28%) ratio 46 and DLCO 54/94 corrected and classic curvature - anoro trial 03/26/16 >>> improved at ov 04/23/2016  - Alpha one screening 04/23/2016 >   MM/ level 151  - Ig profile 04/23/16  marked increased IgG  - 07/02/2017  After extensive coaching device  effectiveness =    90% with dpi > restart anoro  As has improved on this in past but ? Whether can get this thru community wellness with other options  = bevespi or stioto  Strongly suspect heavy exp to biofuels in [place] or premature birth as does not have pattern of severe asthma or obstructive bronchiectasis or CT evidence of the latter  either.    I had an extended discussion with the patient/girlfriend reviewing all relevant studies completed to date and  lasting 15 to 20 minutes of a 25 minute visit    Each maintenance medication was reviewed in detail including most importantly the difference between maintenance and prns and under what circumstances the prns are to be triggered using an action plan format that is not reflected in the computer generated alphabetically organized AVS.    Please see AVS for specific instructions unique to this visit that I personally wrote and verbalized to the the pt in detail and then reviewed with pt  by my nurse highlighting any  changes in therapy recommended at today's visit to their plan of care.

## 2017-07-03 NOTE — Assessment & Plan Note (Signed)
03/26/2016   Walked RA x one lap @ 185 stopped due to  desat to 61% and 90% on 4lpm  - 03/26/2016 rec 4lpm 24/7 > repeat echo in 4 weeks > not done - 04/23/2016 sats ok on RA > rec change 02 to 2lpm hs only (to improve compliance) >did not do  - 07/02/2017  Walked RA x 3 laps @ 185 ft each stopped due to  End of study, sats 89% / nl pace/ no sob   Cards has already requested 24h 02 which is reasonable given PH (which I think is really cor pulmonale) but doubt he will wear it.

## 2017-07-03 NOTE — Assessment & Plan Note (Addendum)
Echo 01/14/16 LVEF 81-77%, septal diastolic and systolic flattening. Trivial MR, Mildly dilated RV with mild/mod reduced RV function. Mild TR, PA peak pressure 81 mm Hg.  RHC  02/16/16   PA 37 - PCWP 9  With CO 4.1 and PVR 6.8 > seen 03/26/2016 rec 24 h 02 @ 4lpm and trial of rx of copd with Anoro > non compliance documented 04/23/2016 so try 2lpm / repeat ono on 2lpm   I suspect this is all cor pulmonale and ? What's the best way to treat it esp given financial issues he's facing taking any of his other meds.  In past, adherence has been a major issue partly related to language barrier and partly related to expenses but already set up for 02 /adcirca by cards/ referred to Dr Lake Bells by Tempie Hoist and no changes needed in interim

## 2017-07-15 ENCOUNTER — Telehealth (HOSPITAL_COMMUNITY): Payer: Self-pay | Admitting: Pharmacist

## 2017-07-15 ENCOUNTER — Other Ambulatory Visit (HOSPITAL_COMMUNITY): Payer: Self-pay | Admitting: *Deleted

## 2017-07-15 ENCOUNTER — Other Ambulatory Visit (HOSPITAL_COMMUNITY): Payer: Self-pay | Admitting: Pharmacist

## 2017-07-15 MED ORDER — TADALAFIL (PAH) 20 MG PO TABS: 20.0000 mg | ORAL_TABLET | Freq: Every day | ORAL | 11 refills | Status: DC

## 2017-07-15 NOTE — Telephone Encounter (Signed)
Received a VM from [employer], PharmD, with Medication Assistance Program [phone]) asking if Mr. Fishbaugh was receiving patient assistance for his Adcirca. I called back and left VM stating that he had been receiving his Adcirca through [facility] with his insurance but as of 5/29, he no longer had active coverage and shipments were discontinued. I called him at that time to attempt to assist with restarting Adcirca and never heard back from him. Have asked Crystal to call me back if needs help with obtaining Adcirca assistance for Mr. Daws.   [name], PharmD, BCPS, CPP Clinical Pharmacist Pager: [phone] Phone: [phone] 07/15/2017 12:14 PM

## 2017-07-15 NOTE — Telephone Encounter (Signed)
Spoke with Crystal again who stated that she is going to try to get assistance for Mr. [name] [name]. Will send Rx to [facility] Medication Assistance Program.   [place] K. Velva Harman, PharmD, BCPS, CPP Clinical Pharmacist Pager: [phone] Phone: [phone] 07/15/2017 4:31 PM

## 2017-07-16 ENCOUNTER — Telehealth: Payer: Self-pay | Admitting: Internal Medicine

## 2017-07-16 NOTE — Telephone Encounter (Signed)
Called and spoke with Crystal with [place] [facility] wanted to verify pt's dx, as pt's has been prescribed Anono and pt reported to [place] that his only pulmonary dx is asthma. I have verified pt's dx which are pulmonary HTN, COPD, Empyema, chronic resp failure and CAP.  Nothing further needed.

## 2017-07-24 ENCOUNTER — Telehealth: Payer: Self-pay | Admitting: Internal Medicine

## 2017-07-24 NOTE — Telephone Encounter (Signed)
lmtcb x1 for pt. 

## 2017-07-25 NOTE — Telephone Encounter (Signed)
lmomtcb x 1 for [employer]

## 2017-07-28 MED ORDER — ALBUTEROL SULFATE HFA 108 (90 BASE) MCG/ACT IN AERS: 2.0000 | INHALATION_SPRAY | Freq: Four times a day (QID) | RESPIRATORY_TRACT | 2 refills | Status: DC | PRN

## 2017-07-28 NOTE — Telephone Encounter (Signed)
Attempted to call Crystal and left voicemail to call back.

## 2017-07-28 NOTE — Telephone Encounter (Signed)
Spoke with Crystal and gave verbal order for Albuterol rx refill. Nothing further needed.

## 2017-07-28 NOTE — Telephone Encounter (Signed)
Return call from crystal and she can be reached @ [phone].Hillery Hunter

## 2017-08-05 ENCOUNTER — Encounter (HOSPITAL_COMMUNITY): Payer: Self-pay | Admitting: *Deleted

## 2017-08-05 NOTE — Progress Notes (Signed)
Received medical records request from [facility] on 07/31/2017 CASE # [id number]  Requested records faxed today to [phone].  Original request will be scanned to patient's electronic medical record.

## 2017-08-06 ENCOUNTER — Encounter (HOSPITAL_COMMUNITY): Payer: Self-pay | Admitting: *Deleted

## 2017-08-06 NOTE — Progress Notes (Unsigned)
Patient dropped off Disability form on 07-15-2017 and requested for completed forms along with medical records be faxed.  Forms completed and requested medical records faxed to [facility] @ [phone] today. I called Anderson Malta who helps care for patient and she is aware they were faxed.  Original forms will be scanned to patient's electronic medical record.

## 2017-08-25 ENCOUNTER — Encounter (HOSPITAL_COMMUNITY): Payer: Self-pay | Admitting: *Deleted

## 2017-08-25 NOTE — Progress Notes (Signed)
Received medical records request from [facility] on 08/25/2017 CASE # [id number]  Requested records faxed today to [phone].  Original request will be scanned to patient's electronic medical record.

## 2017-08-26 ENCOUNTER — Ambulatory Visit (INDEPENDENT_AMBULATORY_CARE_PROVIDER_SITE_OTHER): Payer: Self-pay | Admitting: Pulmonary Disease

## 2017-08-26 ENCOUNTER — Other Ambulatory Visit: Payer: Self-pay

## 2017-08-26 ENCOUNTER — Encounter: Payer: Self-pay | Admitting: Pulmonary Disease

## 2017-08-26 VITALS — BP 106/66 | HR 96 | Ht 60.0 in | Wt 93.8 lb

## 2017-08-26 DIAGNOSIS — J449 Chronic obstructive pulmonary disease, unspecified: Secondary | ICD-10-CM

## 2017-08-26 DIAGNOSIS — J479 Bronchiectasis, uncomplicated: Secondary | ICD-10-CM

## 2017-08-26 DIAGNOSIS — I272 Pulmonary hypertension, unspecified: Secondary | ICD-10-CM

## 2017-08-26 DIAGNOSIS — D892 Hypergammaglobulinemia, unspecified: Secondary | ICD-10-CM

## 2017-08-26 DIAGNOSIS — J9611 Chronic respiratory failure with hypoxia: Secondary | ICD-10-CM

## 2017-08-26 DIAGNOSIS — Z23 Encounter for immunization: Secondary | ICD-10-CM

## 2017-08-26 MED ORDER — UMECLIDINIUM-VILANTEROL 62.5-25 MCG/INH IN AEPB: 1.0000 | INHALATION_SPRAY | Freq: Every day | RESPIRATORY_TRACT | 0 refills | Status: DC

## 2017-08-26 MED ORDER — TADALAFIL (PAH) 20 MG PO TABS: 40.0000 mg | ORAL_TABLET | Freq: Every day | ORAL | 11 refills | Status: DC

## 2017-08-26 MED ORDER — UMECLIDINIUM-VILANTEROL 62.5-25 MCG/INH IN AEPB: 1.0000 | INHALATION_SPRAY | Freq: Every day | RESPIRATORY_TRACT | 5 refills | Status: DC

## 2017-08-26 NOTE — Progress Notes (Signed)
Subjective:    Patient ID: [name] [name], male    DOB: [birth year], 56 y.o.   MRN: [id number]  Synopsis: Referred in 2018 for evaluation of pulmonary hypertension in the setting of chronic left-sided hydropneumothorax and bronchiectasis.  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  Apparently this was originally treated as an empyema with VATS decortication in 2016.  CT imaging since then confirms bronchiectasis in the right lung and pulmonary function testing shows severe airflow obstruction.  In 2017 01/2017 he has had evidence of exertional hypoxemia.  Compliance with medications and oxygen use has been a challenge.  HPI Chief Complaint  Patient presents with  . Advice Only    Referred by MW for a second opinion on pulm htn.      This is a pleasant 56 year old male who was sent to me today for a second opinion on pulmonary hypertension.  He tells me that as a child he had no respiratory illnesses until he turned 60.  At that point he developed a cough which is productive of mucus all the time and severe shortness of breath with any activity.  He never received a diagnosis.  Was never told that he had pneumonia.  There is since then he struggled with shortness of breath and recurrent cough over the years.  In 2016 he developed an empyema and underwent a VATS decortication.  Since then he has struggled with a left-sided hydropneumothorax and he has been diagnosed with pulmonary hypertension.  He has been followed by both the pulmonary and the cardiology clinics.  He had a right heart catheterization performed 2017 which showed evidence of moderate hypertension.  He has been treated with Adcirca, neuro, and varying prescriptions for oxygen.  It sounds as if he has been noncompliant with all of these uniformly.  He comes to clinic today and tells me that he has significant fatigue and shortness of breath while walking on level ground.  He can walk into a grocery store from a  parking lot, about 50 yards but he has a really hard time carrying groceries or climbing a flight of stairs.  He has a dry cough but no mucus production.  He denies any leg swelling or ankle swelling right now  He sleeps with oxygen.     Past Medical History:  Diagnosis Date  . Hypertension   . Kidney stones   . Pulmonary hypertension (HCC)      Family History  Problem Relation Age of Onset  . Hypertension Other      Social History   Social History  . Marital status: Single    Spouse name: N/A  . Number of children: N/A  . Years of education: N/A   Occupational History  . Not on file.   Social History Main Topics  . Smoking status: Never Smoker  . Smokeless tobacco: Never Used  . Alcohol use Yes  . Drug use: No  . Sexual activity: Not on file   Other Topics Concern  . Not on file   Social History Narrative  . No narrative on file     No Known Allergies   Outpatient Medications Prior to Visit  Medication Sig Dispense Refill  . albuterol (PROVENTIL HFA;VENTOLIN HFA) 108 (90 Base) MCG/ACT inhaler Inhale 2 puffs into the lungs every 6 (six) hours as needed for wheezing or shortness of breath. 1 Inhaler 2  . aspirin EC 325 MG EC tablet Take 1 tablet (325 mg total) by mouth daily.  30 tablet 3  . OXYGEN 4lpm BEDTIME ONLY    . tadalafil, PAH, (ADCIRCA) 20 MG tablet Take 1 tablet (20 mg total) by mouth daily. (Patient not taking: Reported on 08/26/2017) 30 tablet 11  . umeclidinium-vilanterol (ANORO ELLIPTA) 62.5-25 MCG/INH AEPB Inhale 1 puff into the lungs daily. (Patient not taking: Reported on 08/26/2017) 60 each 11   No facility-administered medications prior to visit.       Review of Systems  Constitutional: Negative for fever and unexpected weight change.  HENT: Negative for congestion, dental problem, ear pain, nosebleeds, postnasal drip, rhinorrhea, sinus pressure, sneezing, sore throat and trouble swallowing.   Eyes: Negative for redness and itching.    Respiratory: Positive for cough, chest tightness and shortness of breath. Negative for wheezing.   Cardiovascular: Negative for palpitations and leg swelling.  Gastrointestinal: Negative for nausea and vomiting.  Genitourinary: Negative for dysuria.  Musculoskeletal: Negative for joint swelling.  Skin: Negative for rash.  Neurological: Negative for headaches.  Hematological: Does not bruise/bleed easily.  Psychiatric/Behavioral: Negative for dysphoric mood. The patient is not nervous/anxious.        Objective:   Physical Exam  Vitals:   08/26/17 1519  BP: 106/66  Pulse: 96  SpO2: 94%  Weight: 93 lb 12.8 oz (42.5 kg)  Height: 5' (1.524 m)  RA  Ambulated 500 feet on RA and his O2 saturation was 90%  Gen: well appearing, no acute distress HENT: NCAT, OP clear, neck supple without masses Eyes: PERRL, EOMi Lymph: no cervical lymphadenopathy PULM: Crackles bases bilaterally B CV: RRR, no mgr, no JVD GI: BS+, soft, nontender, no hsm Derm: no rash or skin breakdown MSK: normal bulk and tone Neuro: A&Ox4, CN II-XII intact, strength 5/5 in all 4 extremities Psyche: normal mood and affect   RHC: 06/2017 RA = 3, RV = 59/6   PA = 62/23 (40) PCW = 6 Fick cardiac output/index = 4.2/2.9 PVR = 8.1 WU Ao sat = 93%  PA sat = 67%, 68%  Echo: August 2018 LVEF 60-65%, D shaped interventricular septum suggestive of RV pressure volume overload, moderately dilated right ventricle with moderately depressed systolic function, PA pressure estimate 59 mmHg, RA dilation, valvues OK, moderate Tricuspid regurgitation  Chest imaging: August 2018 chest x-ray images independently reviewed the prominent pulmonary vasculature, scar versus consolidation right lung, chronic appearing left-sided pleural effusion with apical pneumothorax4 January 2018 CT angiogram chest images independently reviewed showing chronic appearing hydropneumothorax in the left lung, scattered consolidation patches in the right  upper lobe and right lower lobe, there is also bronchiectasis changes in the right lower lobe.  Pulmonary function test: April 2017 ratio 45%, FEV1 0.56 L 21% predicted, FVC 1.25 L 37% predicted, total lung capacity 2.88 L 58% predicted, DLCO 10.30 54% predicted  Micro: 11/2016 Sputum: normal flora  Labs: 04/2016 Alpha 1 MZ, normal level; total IgG level severely elevated, IgA normal, IgM low, IgE 2,066  CBC    Component Value Date/Time   WBC 6.4 11/09/2016 0524   RBC 5.05 11/09/2016 0524   HGB 13.9 11/09/2016 0524   HCT 41.8 11/09/2016 0524   PLT 161 11/09/2016 0524   MCV 82.8 11/09/2016 0524   MCH 27.5 11/09/2016 0524   MCHC 33.3 11/09/2016 0524   RDW 13.7 11/09/2016 0524   LYMPHSABS 1.9 11/05/2016 2108   MONOABS 1.0 11/05/2016 2108   EOSABS 0.2 11/05/2016 2108   BASOSABS 0.1 11/05/2016 2108       Assessment & Plan:    Paraproteinemia  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  Major barrier here is compliance with medications which I think is probably due to poor lack of insight into his disease and poor resources.  As I believe that his pulmonary hypertension is a physiologic response to his severe lung disease I am not sure they were going to gain much ground with continued efforts at more pulmonary vasodilation.  Talked to him today through a translator about the importance of compliance with his bronchodilators and oxygen.  In addition to all this and noted that in 2017 he had a severely elevated serum IgE and IgG level.  At this point is not showing signs or symptoms of wheezing to suggest an allergic process in  the lungs that we need to keep this in the back of her mind if he gets sick again.  The elevated serum IgG level is worrisome though I will send serum electrophoresis today to evaluate for a paraproteinemia/myeloma type process.  Plan: Bronchiectasis with severe airflow obstruction: Restart Anoro, take this once a day no matter how you feel, samples given Use albuterol as needed Flu shot today  Paraproteinemia (remarkably elevated IgG level): Check SPEP to evaluate for myeloma  Chronic respiratory failure with hypoxemia: Keep using oxygen when you sleep 6MW next visit  Pulmonary hypertension: Restart Adcirca 20mg  daily  We will see you back in 3-4 weeks or sooner if needed    Current Outpatient Prescriptions:  .  albuterol (PROVENTIL HFA;VENTOLIN HFA) 108 (90 Base) MCG/ACT inhaler, Inhale 2 puffs into the lungs every 6 (six) hours as needed for wheezing or shortness of breath., Disp: 1 Inhaler, Rfl: 2 .  aspirin EC 325 MG EC tablet, Take 1 tablet (325 mg total) by mouth daily., Disp: 30 tablet, Rfl: 3 .  OXYGEN, 4lpm BEDTIME ONLY, Disp: , Rfl:

## 2017-08-26 NOTE — Patient Instructions (Addendum)
Bronchiectasis with severe airflow obstruction: Restart Anoro, take this once a day no matter how you feel, samples given Use albuterol as needed Flu shot today  Paraproteinemia (remarkably elevated IgG level): Check SPEP to evaluate for myeloma  Chronic respiratory failure with hypoxemia: Keep using oxygen when you sleep  Pulmonary hypertension: Restart Adcirca 20mg  daily  We will see you back in 3-4 weeks or sooner if needed

## 2017-08-28 ENCOUNTER — Telehealth: Payer: Self-pay | Admitting: Pulmonary Disease

## 2017-08-28 LAB — PROTEIN ELECTROPHORESIS, SERUM
ABNORMAL PROTEIN BAND1: 3.5 g/dL — AB
ALPHA 1: 0.3 g/dL (ref 0.2–0.3)
ALPHA 2: 0.8 g/dL (ref 0.5–0.9)
Albumin ELP: 3.3 g/dL — ABNORMAL LOW (ref 3.8–4.8)
BETA 2: 0.5 g/dL (ref 0.2–0.5)
Beta Globulin: 0.5 g/dL (ref 0.4–0.6)
GAMMA GLOBULIN: 3.9 g/dL — AB (ref 0.8–1.7)
Total Protein: 9.3 g/dL — ABNORMAL HIGH (ref 6.1–8.1)

## 2017-08-28 NOTE — Telephone Encounter (Signed)
AC have you done any paper work for the [place]?  Looks like the [facility] is telling the pt that he will need to try [employer] to see if he is eligible for the assistance from them.  Please advise. Thanks

## 2017-09-01 NOTE — Telephone Encounter (Signed)
I have not- this rx was initially started by cardiology, and we sent in a refill at pt's OV at pt request. lmtcb X1 for pt.

## 2017-09-03 NOTE — Telephone Encounter (Signed)
Called and spoke with pt and he is aware that he will need to contact the cardiologist office to discuss the adcirca and the pt assistance forms.

## 2017-09-04 ENCOUNTER — Other Ambulatory Visit: Payer: Self-pay

## 2017-09-04 DIAGNOSIS — R778 Other specified abnormalities of plasma proteins: Secondary | ICD-10-CM

## 2017-09-17 ENCOUNTER — Encounter: Payer: Self-pay | Admitting: Acute Care

## 2017-09-17 ENCOUNTER — Telehealth: Payer: Self-pay | Admitting: Acute Care

## 2017-09-17 ENCOUNTER — Ambulatory Visit (INDEPENDENT_AMBULATORY_CARE_PROVIDER_SITE_OTHER): Payer: Self-pay | Admitting: Acute Care

## 2017-09-17 ENCOUNTER — Ambulatory Visit (INDEPENDENT_AMBULATORY_CARE_PROVIDER_SITE_OTHER): Payer: Self-pay | Admitting: *Deleted

## 2017-09-17 VITALS — BP 104/64 | HR 91 | Ht 60.0 in | Wt 92.0 lb

## 2017-09-17 DIAGNOSIS — I272 Pulmonary hypertension, unspecified: Secondary | ICD-10-CM

## 2017-09-17 DIAGNOSIS — R0600 Dyspnea, unspecified: Secondary | ICD-10-CM

## 2017-09-17 DIAGNOSIS — D892 Hypergammaglobulinemia, unspecified: Secondary | ICD-10-CM

## 2017-09-17 DIAGNOSIS — J449 Chronic obstructive pulmonary disease, unspecified: Secondary | ICD-10-CM

## 2017-09-17 DIAGNOSIS — R0609 Other forms of dyspnea: Secondary | ICD-10-CM

## 2017-09-17 MED ORDER — UMECLIDINIUM-VILANTEROL 62.5-25 MCG/INH IN AEPB: 1.0000 | INHALATION_SPRAY | Freq: Every day | RESPIRATORY_TRACT | 5 refills | Status: DC

## 2017-09-17 NOTE — Assessment & Plan Note (Signed)
States dyspnea is better after starting Anoro Plan: Continue using the Anoro one puff once daily. We will give you a prescription for Anoro today. We will make sure you get established at [facility] for your care and assistance with medications. Continue using the rescue inhaler as needed for shortness of breath We will schedule a 6 minute walk today. Follow up with Dr. Lake Bells in 3 months or sooner as needed. Please contact office for sooner follow up if symptoms do not improve or worsen or seek emergency care

## 2017-09-17 NOTE — Assessment & Plan Note (Signed)
Referral to hematology scheduled 09/29/2017

## 2017-09-17 NOTE — Assessment & Plan Note (Addendum)
States he is compliant with Adcirca Plan: Continue Adcirca 20 mg daily as you have been doing. Will need BMET at 3 month follow up. Will need to establish with [facility] for medications and continued care. Follow up with Dr. Lake Bells in 3 months or sooner as needed. Please contact office for sooner follow up if symptoms do not improve or worsen or seek emergency care

## 2017-09-17 NOTE — Telephone Encounter (Signed)
Please make sure this patient is established at the community wellness center for medication assistance and follow up care.Thanks so much.

## 2017-09-17 NOTE — Patient Instructions (Addendum)
It is good to meet you today. Continue using the Anoro one puff once daily. Continue Adcirca 20 mg daily as you have been doing We will give you a prescription for Anoro today. We will make sure you get established at [facility] for your care and assistance with medications. Continue using the rescue inhaler as needed for shortness of breath Follow up with Hematology 09/29/2017 as is scheduled We will schedule a 6 minute walk today. Will need BMET at 3 month follow up. Follow up with Dr. Lake Bells in 3 months or sooner as needed. Please contact office for sooner follow up if symptoms do not improve or worsen or seek emergency care

## 2017-09-17 NOTE — Progress Notes (Signed)
History of Present Illness [name] [name] is a 56 y.o. 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  Apparently this was originally treated as an empyema with VATS decortication in 2016.  CT imaging since then confirms bronchiectasis in the right lung and pulmonary function testing shows severe airflow obstruction.  In 2017 01/2017 he has had evidence of exertional hypoxemia.  Compliance with medications and oxygen use has been a challenge.  09/17/2017 Follow up  Pt returns for follow up. He was seen by [name]. Lake [name] 08/26/2017 for fatigue and shortness of breath.  Plan of care after that visit was as follows:  Plan: Bronchiectasis with severe airflow obstruction: Restart Anoro, take this once a day no matter how you feel, samples given Use albuterol as needed Flu shot today  [name] [name] (remarkably elevated IgG level): Check SPEP to evaluate for myeloma  Chronic respiratory failure with hypoxemia: Keep using oxygen when you sleep 6MW next visit  Pulmonary hypertension: Restart Adcirca 20mg  daily    He states, through interpreter that he is better since starting the Anoro inhaler.He states he is compliant with the inhaler daily. He states he has been using his rescue inhaler about 2-3 times daily.He is wearing his oxygen at night.He states he would like to continue using the Anoro as he feels it is helping him. He states he has been compliant with his Adcirca 20 mg daily for pulmonary hypertension.He is aware his lab test done at the last OV were abnormal and has follow up at Hematology 09/29/17.He denies  fever, chest pain, orthopnea or hemoptysis  Test Results:  09/17/2017 6 Minute Walk: stopped with pt stopped with 59min 44sec left on the clock d/t fatigue (Borg = 3) and dyspnea (Borg = 4); O2 sat = 92% RA, HR = 100.  Walked 360 feet RHC: 06/2017 RA = 3, RV = 59/6   PA = 62/23 (40) PCW = 6 Fick cardiac output/index = 4.2/2.9 PVR = 8.1 WU Ao sat = 93%  PA sat = 67%, 68%  Echo: August 2018 LVEF 60-65%, D shaped interventricular septum suggestive of RV pressure volume overload, moderately dilated right ventricle with moderately depressed systolic function, PA pressure estimate 59 mmHg, RA dilation, valvues OK, moderate Tricuspid regurgitation  Chest imaging: August 2018 chest x-ray images independently reviewed the prominent pulmonary vasculature, scar versus consolidation right lung, chronic appearing left-sided pleural effusion with apical pneumothorax4 January 2018 CT angiogram chest images independently reviewed showing chronic appearing hydropneumothorax in the left lung, scattered consolidation patches in the right upper lobe and right lower lobe, there is also bronchiectasis changes in the right lower lobe.  Pulmonary function test: April 2017 ratio 45%, FEV1 0.56 L 21% predicted, FVC 1.25 L 37% predicted, total lung capacity 2.88 L 58% predicted, DLCO 10.30 54% predicted: corrected and classic curvature  Micro: 11/2016 Sputum: normal flora  Labs: 04/2016 Alpha 1 MZ, normal level; total IgG level severely elevated, IgA normal, IgM low, IgE 2,066  - anoro trial 03/26/16 >>> improved at ov 04/23/2016  - Alpha one screening 04/23/2016 >   MM/ level 151  - Ig profile 04/23/16  marked increased IgG  - 07/02/2017  After extensive coaching device  effectiveness =    90% with dpi > restart anoro  As has improved on this in past but ? Whether can get this thru community wellness with other options  = bevespi or stioto  Strongly suspect heavy exp to biofuels in [place] or premature birth as does  not have pattern of severe asthma or obstructive bronchiectasis or CT evidence of the latter  either.     CBC Latest Ref Rng & Units 11/09/2016 11/08/2016 11/05/2016  WBC 4.0 - 10.5 K/uL 6.4 8.3 5.6  Hemoglobin 13.0 - 17.0 g/dL 13.9 13.4 13.6  Hematocrit 39.0 - 52.0 % 41.8 41.8 40.2  Platelets 150 - 400 K/uL 161 316 300    BMP Latest Ref Rng & Units 11/09/2016 11/08/2016 11/05/2016  Glucose 65 - 99 mg/dL 87 93 122(H)  BUN 6 - 20 mg/dL 10 8 12   Creatinine 0.61 - 1.24 mg/dL 0.79 0.90 0.91  Sodium 135 - 145 mmol/L 136 135 134(L)  Potassium 3.5 - 5.1 mmol/L 4.6 4.7 4.3  Chloride 101 - 111 mmol/L 97(L) 96(L) 98(L)  CO2 22 - 32 mmol/L 29 36(H) 26  Calcium 8.9 - 10.3 mg/dL 8.1(L) 8.3(L) 8.4(L)    BNP    Component Value Date/Time   BNP 106.1 (H) 11/05/2016 2108    ProBNP No results found for: PROBNP  PFT    Component Value Date/Time   FEV1PRE 0.74 02/16/2016 0932   FEV1POST 0.56 02/16/2016 0932   FVCPRE 1.63 02/16/2016 0932   FVCPOST 1.25 02/16/2016 0932   TLC 2.88 02/16/2016 0932   DLCOUNC 10.30 02/16/2016 0932   PREFEV1FVCRT 46 02/16/2016 0932   PSTFEV1FVCRT 45 02/16/2016 0932    No results found.   Past medical hx Past Medical History:  Diagnosis Date  . Hypertension   . Kidney stones   . Pulmonary hypertension (HCC)      Social History   Tobacco Use  . Smoking status: Never Smoker  . Smokeless tobacco: Never Used  Substance Use Topics  . Alcohol use: Yes  . Drug use: No    Mr.[name] [name] reports that  has never smoked. he has never used smokeless tobacco. He reports that he drinks alcohol. He reports that he does not use drugs.  Tobacco Cessation: Never smoker  Past surgical hx, Family hx, Social hx all reviewed.  Current Outpatient Medications on File Prior to Visit  Medication Sig  . albuterol (PROVENTIL HFA;VENTOLIN HFA) 108 (90 Base) MCG/ACT inhaler Inhale 2 puffs into the lungs every 6 (six) hours as needed for wheezing or shortness of breath.  Marland Kitchen aspirin EC  325 MG EC tablet Take 1 tablet (325 mg total) by mouth daily.  . OXYGEN 2lpm BEDTIME ONLY  . tadalafil, PAH, (ADCIRCA) 20 MG tablet Take 2 tablets (40 mg total) by mouth daily.  Marland Kitchen umeclidinium-vilanterol (ANORO ELLIPTA) 62.5-25 MCG/INH AEPB Inhale 1 puff into the lungs daily.   No current facility-administered medications on file prior to visit.      No Known Allergies  Review Of Systems:  Constitutional:   +  weight loss,No  night sweats,  Fevers, chills, fatigue, or  lassitude.  HEENT:   No headaches,  Difficulty swallowing,  Tooth/dental problems, or  Sore throat,                No sneezing, itching, ear ache, nasal congestion, post nasal drip,   CV:  No chest pain,  Orthopnea, PND, swelling in lower extremities, anasarca, dizziness, palpitations, syncope.  GI  No heartburn, indigestion, abdominal pain, nausea, vomiting, diarrhea, change in bowel habits, loss of appetite, bloody stools.   Resp: No shortness of breath with exertion or at rest.  No excess mucus, no productive cough,  No non-productive cough,  No coughing up of blood.  No change in color of mucus.  No wheezing.  No chest wall deformity  Skin: no rash or lesions.  GU: no dysuria, change in color of urine, no urgency or frequency.  No flank pain, no hematuria   MS:  No joint pain or swelling.  No decreased range of motion.  No back pain.  Psych:  No change in mood or affect. No depression or anxiety.  No memory loss.   Vital Signs BP 104/64 (BP Location: Right Arm, Cuff Size: Normal)   Pulse 91   Ht 5' (1.524 m)   Wt 92 lb (41.7 kg)   SpO2 95%   BMI 17.97 kg/m    Physical Exam:  General- No distress,  A&Ox3 ENT: No sinus tenderness, TM clear, pale nasal mucosa, no oral exudate,no post nasal drip, no LAN Cardiac: S1, S2, regular rate and rhythm, no murmur Chest: No wheeze/ rales/ dullness; no accessory muscle use, no nasal flaring, no sternal retractions Abd.: Soft Non-tender Ext: No clubbing cyanosis,  edema Neuro:  normal strength Skin: No rashes, warm and dry Psych: normal mood and behavior   Assessment/Plan  [name] GOLD IV criteria but never smoked  States dyspnea is better after starting Anoro Plan: Continue using the Anoro one puff once daily. We will give you a prescription for Anoro today. We will make sure you get established at [facility] for your care and assistance with medications. Continue using the rescue inhaler as needed for shortness of breath We will schedule a 6 minute walk today. Follow up with [name]. Lake [name] in 3 months or sooner as needed. Please contact office for sooner follow up if symptoms do not improve or worsen or seek emergency care    Pulmonary HTN States he is compliant with Adcirca Plan: Continue Adcirca 20 mg daily as you have been doing. Will need BMET at 3 month follow up. Will need to establish with [facility] for medications and continued care. Follow up with [name]. Lake [name] in 3 months or sooner as needed. Please contact office for sooner follow up if symptoms do not improve or worsen or seek emergency care    [name] [name] Referral to hematology scheduled 09/29/2017    Magdalen Spatz, NP 09/17/2017  8:47 PM

## 2017-09-17 NOTE — Progress Notes (Signed)
SIX MIN WALK 09/17/2017 07/02/2017  Medications no medications -  Supplimental Oxygen during Test? (L/min) No No  Laps 7 -  Partial Lap (in Meters) 24 -  Baseline BP (sitting) 104/74 -  Baseline Heartrate 89 -  Baseline Dyspnea (Borg Scale) 0 -  Baseline Fatigue (Borg Scale) 0 -  Baseline SPO2 95 -  BP (sitting) 128/82 -  Heartrate 104 -  Dyspnea (Borg Scale) 5 -  Fatigue (Borg Scale) 4 -  SPO2 90 -  BP (sitting) 104/68 -  Heartrate 89 -  SPO2 98 -  Stopped or Paused before Six Minutes Yes -  Other Symptoms at end of Exercise pt stopped with 49min 44sec left on the clock d/t fatigue (Borg = 3) and dyspnea (Borg = 4); O2 sat = 92ra, HR = 100.   -  Interpretation (No Data) -  Distance Completed 360 -  Tech Comments: pt completed test at a slow-moderate pace.  he did stop to rest with 57min 44sec left on the clock with a distance of 4laps/139meters.  pt did complain of tightness in chest at end of test but denied any angine, dizziness, hip/leg/calf pain. normal pace/no SOB//lmr

## 2017-09-18 NOTE — Telephone Encounter (Signed)
Called and spoke with patient today regarding message from [place] in regards to establishing himself with community wellness center for medication assistance and follow up care for himself..  Patient has verbalized understanding of recommendations and denied any questions or concerns at this time. Nothing further needed at this time.

## 2017-09-29 ENCOUNTER — Telehealth: Payer: Self-pay

## 2017-09-29 ENCOUNTER — Ambulatory Visit (HOSPITAL_BASED_OUTPATIENT_CLINIC_OR_DEPARTMENT_OTHER): Payer: Self-pay | Admitting: Hematology and Oncology

## 2017-09-29 ENCOUNTER — Ambulatory Visit (HOSPITAL_BASED_OUTPATIENT_CLINIC_OR_DEPARTMENT_OTHER): Payer: Self-pay

## 2017-09-29 ENCOUNTER — Encounter: Payer: Self-pay | Admitting: Hematology and Oncology

## 2017-09-29 VITALS — BP 125/89 | HR 81 | Temp 97.6°F | Resp 17 | Ht 60.0 in | Wt 94.7 lb

## 2017-09-29 DIAGNOSIS — D892 Hypergammaglobulinemia, unspecified: Secondary | ICD-10-CM

## 2017-09-29 LAB — CBC WITH DIFFERENTIAL/PLATELET
BASO%: 0.4 % (ref 0.0–2.0)
BASOS ABS: 0 10*3/uL (ref 0.0–0.1)
EOS%: 6 % (ref 0.0–7.0)
Eosinophils Absolute: 0.3 10*3/uL (ref 0.0–0.5)
HEMATOCRIT: 36.7 % — AB (ref 38.4–49.9)
HEMOGLOBIN: 12.2 g/dL — AB (ref 13.0–17.1)
LYMPH%: 31.5 % (ref 14.0–49.0)
MCH: 28.9 pg (ref 27.2–33.4)
MCHC: 33.2 g/dL (ref 32.0–36.0)
MCV: 87 fL (ref 79.3–98.0)
MONO#: 0.4 10*3/uL (ref 0.1–0.9)
MONO%: 6.8 % (ref 0.0–14.0)
NEUT#: 3 10*3/uL (ref 1.5–6.5)
NEUT%: 55.3 % (ref 39.0–75.0)
Platelets: 286 10*3/uL (ref 140–400)
RBC: 4.22 10*6/uL (ref 4.20–5.82)
RDW: 14.8 % — ABNORMAL HIGH (ref 11.0–14.6)
WBC: 5.5 10*3/uL (ref 4.0–10.3)
lymph#: 1.7 10*3/uL (ref 0.9–3.3)

## 2017-09-29 LAB — COMPREHENSIVE METABOLIC PANEL
ALT: 12 U/L (ref 0–55)
ANION GAP: 9 meq/L (ref 3–11)
AST: 28 U/L (ref 5–34)
Albumin: 2.7 g/dL — ABNORMAL LOW (ref 3.5–5.0)
Alkaline Phosphatase: 75 U/L (ref 40–150)
BUN: 7 mg/dL (ref 7.0–26.0)
CHLORIDE: 100 meq/L (ref 98–109)
CO2: 26 meq/L (ref 22–29)
CREATININE: 0.9 mg/dL (ref 0.7–1.3)
Calcium: 8.9 mg/dL (ref 8.4–10.4)
EGFR: >60 mL/min/{1.73_m2} (ref >60)
GLUCOSE: 78 mg/dL (ref 70–140)
Potassium: 4.3 mEq/L (ref 3.5–5.1)
SODIUM: 135 meq/L — AB (ref 136–145)
Total Bilirubin: 0.27 mg/dL (ref 0.20–1.20)
Total Protein: 10 g/dL — ABNORMAL HIGH (ref 6.4–8.3)

## 2017-09-29 LAB — LACTATE DEHYDROGENASE: LDH: 169 U/L (ref 125–245)

## 2017-09-29 NOTE — Telephone Encounter (Signed)
Printed avs and calender for upcoming appointment. Per 11/26 los added on for labs today.

## 2017-09-30 LAB — MULTIPLE MYELOMA PANEL, SERUM
ALBUMIN SERPL ELPH-MCNC: 2.9 g/dL (ref 2.9–4.4)
ALPHA 1: 0.3 g/dL (ref 0.0–0.4)
ALPHA2 GLOB SERPL ELPH-MCNC: 0.8 g/dL (ref 0.4–1.0)
Albumin/Glob SerPl: 0.5 — ABNORMAL LOW (ref 0.7–1.7)
B-GLOBULIN SERPL ELPH-MCNC: 1.1 g/dL (ref 0.7–1.3)
GAMMA GLOB SERPL ELPH-MCNC: 4 g/dL — AB (ref 0.4–1.8)
GLOBULIN, TOTAL: 6.3 g/dL — AB (ref 2.2–3.9)
IgA, Qn, Serum: 142 mg/dL (ref 90–386)
IgM, Qn, Serum: 32 mg/dL (ref 20–172)
M PROTEIN SERPL ELPH-MCNC: 3.4 g/dL — AB
Total Protein: 9.2 g/dL — ABNORMAL HIGH (ref 6.0–8.5)

## 2017-09-30 LAB — KAPPA/LAMBDA LIGHT CHAINS
IG LAMBDA FREE LIGHT CHAIN: 15.8 mg/L (ref 5.7–26.3)
Ig Kappa Free Light Chain: 74.2 mg/L — ABNORMAL HIGH (ref 3.3–19.4)
Kappa/Lambda FluidC Ratio: 4.7 — ABNORMAL HIGH (ref 0.26–1.65)

## 2017-10-01 ENCOUNTER — Encounter (HOSPITAL_COMMUNITY): Payer: Self-pay | Admitting: Internal Medicine

## 2017-10-01 ENCOUNTER — Ambulatory Visit (HOSPITAL_COMMUNITY)
Admission: RE | Admit: 2017-10-01 | Discharge: 2017-10-01 | Disposition: A | Discharge status: Home / Self Care | Payer: Self-pay | Source: Ambulatory Visit | Attending: Internal Medicine | Admitting: Internal Medicine

## 2017-10-01 VITALS — BP 128/88 | HR 90 | Wt 95.0 lb

## 2017-10-01 DIAGNOSIS — Z87442 Personal history of urinary calculi: Secondary | ICD-10-CM | POA: Insufficient documentation

## 2017-10-01 DIAGNOSIS — I272 Pulmonary hypertension, unspecified: Secondary | ICD-10-CM

## 2017-10-01 DIAGNOSIS — Z7982 Long term (current) use of aspirin: Secondary | ICD-10-CM | POA: Insufficient documentation

## 2017-10-01 DIAGNOSIS — J479 Bronchiectasis, uncomplicated: Secondary | ICD-10-CM | POA: Insufficient documentation

## 2017-10-01 DIAGNOSIS — I10 Essential (primary) hypertension: Secondary | ICD-10-CM | POA: Insufficient documentation

## 2017-10-01 DIAGNOSIS — Z79899 Other long term (current) drug therapy: Secondary | ICD-10-CM | POA: Insufficient documentation

## 2017-10-01 NOTE — Patient Instructions (Signed)
Your physician has requested that you have an echocardiogram. Echocardiography is a painless test that uses sound waves to create images of your heart. It provides your doctor with information about the size and shape of your heart and how well your heart's chambers and valves are working. This procedure takes approximately one hour. There are no restrictions for this procedure.   Your physician recommends that you schedule a follow-up appointment in: 4 months with echocardiogram

## 2017-10-01 NOTE — Progress Notes (Signed)
ADVANCED HF CLINIC NOTE  Patient ID: [name] [name], male   DOB: October 13, 1961, 56 y.o.   MRN: [id number] PCP: Primary HF Cardiologist: Dr Vaughan Browner  HPI: [name] [name] is a 56y.o. [place] male with PMH of urolithiasis, HTN, chronic empyema s/p remote lung decortication and PAH.  He was admitted to [facility] in 09/2015 for empyema, underwent a VATs procedure and chest tube placement and PICC line placement for outpatient IV ABX. He was non compliant with Cardiothoracic surgery and other provider visits and continued to go to the ED for care, where his chest tube was eventually removed in 10/2015. He was readmitted in 3/17 with hemoptysis and recurrent empyema. Chest tube placed. Culture + methicillin resistant CN Staph. Treated with vanc. Echo with evidence of PAH started on sildenafil.   Admitted 1/2 through 11/09/16  with pneumonia. Treated with antibiotics.   Seen in Pulmonary Clinic earlier this month. Diagnosed with bronchiectasis. Anoro restarted. Also titrated Adcirca from 20 to 40 daily.  Presents today for [facility] follow up. History taken with interpreter present. Gets SOB with mild exertion. No real change. Taking Adcirca 40 for about 3 weeks and feels it is helping. No edema. No syncope. Has large tank for O2 he uses at home but doesn't have portable. No chest pain.   Echo 8/18 LVEF 60% RV dilated moderately to severe HK. Flattened septum. RVSP ~ 60-65. Personally reviewed  .  RHC 8/17 (off sildenafil) RA = 3 RV = 59/6 PA = 62/23 (40) PCW = 6 Fick cardiac output/index = 4.2/2.9 PVR = 8.1 WU Ao sat = 93%  PA sat = 67%, 68%  RHC on 01/18/16 .  RA = 4 RV = 54/3/7 PA = 59/24 (37) PCW = 9 Fick cardiac output/index = 4.1/3.2 PVR = 6.8 WU FA sat = 95% PA sat = 68%, 68%  PFTs 02/16/16 FVC 1.63 (48%) FEV1 0.75 (28%) FEV1/FVC ratio 46% TLC 2.88 (58%) DLCO 10.3 (54%)  ECHO 11/2016: LVEF 60-65% Grade II DD peak PA pressure 56 mm hg.  Had echo 7/17. LVEF 55-60% MildlyD-shaped septum. RV dilated  with normal function. No TR jet to measure PA pressures.  Echo 01/14/16 LVEF 55-73%, septal diastolic and systolic flattening. Trivial MR, Mildly dilated RV with mild/mod reduced RV function. Mild TR, PA peak pressure 81 mm Hg.     ROS: All systems negative except as listed in HPI, PMH and Problem List.  SH:  Social History   Socioeconomic History  . Marital status: Single    Spouse name: Not on file  . Number of children: Not on file  . Years of education: Not on file  . Highest education level: Not on file  Social Needs  . Financial resource strain: Not on file  . Food insecurity - worry: Not on file  . Food insecurity - inability: Not on file  . Transportation needs - medical: Not on file  . Transportation needs - non-medical: Not on file  Occupational History  . Not on file  Tobacco Use  . Smoking status: Never Smoker  . Smokeless tobacco: Never Used  Substance and Sexual Activity  . Alcohol use: Yes  . Drug use: No  . Sexual activity: Not on file  Other Topics Concern  . Not on file  Social History Narrative  . Not on file    FH:  Family History  Problem Relation Age of Onset  . Hypertension Other     Past Medical History:  Diagnosis Date  . Hypertension   .  Kidney stones   . Pulmonary hypertension ([place])     Current Outpatient Medications  Medication Sig Dispense Refill  . albuterol (PROVENTIL HFA;VENTOLIN HFA) 108 (90 Base) MCG/ACT inhaler Inhale 2 puffs into the lungs every 6 (six) hours as needed for wheezing or shortness of breath. 1 Inhaler 2  . tadalafil, PAH, (ADCIRCA) 20 MG tablet Take 2 tablets (40 mg total) by mouth daily. 60 tablet 11  . umeclidinium-vilanterol (ANORO ELLIPTA) 62.5-25 MCG/INH AEPB Inhale 1 puff daily into the lungs. 1 each 5  . aspirin EC 325 MG EC tablet Take 1 tablet (325 mg total) by mouth daily. 30 tablet 3  . OXYGEN 2lpm BEDTIME ONLY    . umeclidinium-vilanterol (ANORO ELLIPTA) 62.5-25 MCG/INH AEPB Inhale 1 puff into the  lungs daily. 60 each 5   No current facility-administered medications for this encounter.     Vitals:   10/01/17 1113  BP: 128/88  Pulse: 90  SpO2: 99%  Weight: 95 lb (43.1 kg)   Wt Readings from Last 3 Encounters:  10/01/17 95 lb (43.1 kg)  09/29/17 94 lb 11.2 oz (43 kg)  09/17/17 92 lb (41.7 kg)     PHYSICAL EXAM: General:  Thin male in NAD.  HEENT: Norma Neck: supple. JVP 6. Carotids 2+ bilat; no bruits. No lymphadenopathy or thryomegaly appreciated. Cor: PMI nondisplaced. Regular rate & rhythm. + prominent P2 Lungs: clear with markedly decreased BS throughout  No wheeze Abdomen: soft, nontender, nondistended. No hepatosplenomegaly. No bruits or masses. Good bowel sounds. Extremities: no cyanosis, rash, edema + clunbbing Neuro: alert & orientedx3, cranial nerves grossly intact. moves all 4 extremities w/o difficulty. Affect pleasant   ASSESSMENT & PLAN: 1. Pulmonary HTN with cor pulmonale - Primarily Group 3 with likely component of Group 1  - PFTs with DLCO 54%, - Echo 8/18 with persistent RV strain and RVSP 60-41mmHG.  - RHC on 8/17 with mild to moderate PAH. PVR 8.1 - ANA negative, ANCA negative, RF negative.  HIV/HCV test negative - VQ scan low probability for PE.  - LE dopplers negative for DVT.  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 He has improved with Adcrica. Will continue to follow and watch for shunting.  - Repeat echo now that he is on Adcirca. Consider repeat RHC at next visit - Continue supplemental O2 2. H/o left-sided empyema s/p drainage - no recurrence 3. COPD/bronchiectasis - Severe by PFTs 02/16/16 FVC 1.63 (48%), FEV1 0.75 (28%), FEV1/FVC ratio 46%, TLC 2.88 (58%),  DLCO 10.3 (54%) - Continue nighttime O2. - He follows with Pulmonary - I have suggested he ask for portable O2 tank - Unable to afford Pulmonary Rehab  Glori Bickers MD 11:47 AM

## 2017-10-02 DIAGNOSIS — Z736 Limitation of activities due to disability: Secondary | ICD-10-CM

## 2017-10-08 ENCOUNTER — Telehealth: Payer: Self-pay

## 2017-10-08 ENCOUNTER — Encounter: Payer: Self-pay | Admitting: Hematology and Oncology

## 2017-10-08 ENCOUNTER — Ambulatory Visit (HOSPITAL_BASED_OUTPATIENT_CLINIC_OR_DEPARTMENT_OTHER): Payer: Self-pay | Admitting: Hematology and Oncology

## 2017-10-08 VITALS — BP 135/100 | HR 87 | Temp 97.7°F | Resp 18 | Ht 60.0 in | Wt 96.8 lb

## 2017-10-08 DIAGNOSIS — D472 Monoclonal gammopathy: Secondary | ICD-10-CM

## 2017-10-08 NOTE — Telephone Encounter (Signed)
Printed avs and calender for up coming appointment. Per 12/5 los 

## 2017-10-12 NOTE — Progress Notes (Signed)
[name] [name] Cancer New Visit:  Assessment: Paraproteinemia 56 y.o.  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 Will initiate evaluation by repeating labs as outlined below.  Previously noted IgG levels that were elevated or more likely consistent with previous infections and by themselves not constitute a monoclonal gammopathy nor do they raise suspicion of multiple myeloma in absence of other laboratory signs or symptoms of the disease.  Plan: --Labs as outlined below -- return to clinic among week to review the findings.  Voice recognition software was used and creation of this note. Despite my best effort at editing the text, some misspelling/errors may have occurred.   Orders Placed This Encounter  Procedures  . CBC with Differential    Standing Status:   Future    Number of Occurrences:   1    Standing Expiration Date:   09/29/2018  . Comprehensive metabolic panel    Standing Status:   Future    Number of Occurrences:   1    Standing Expiration Date:   09/29/2018  . Lactate dehydrogenase (LDH)    Standing Status:   Future    Number of Occurrences:   1    Standing Expiration Date:   09/29/2018  . Multiple Myeloma Panel (SPEP&IFE w/QIG)    Standing Status:   Future    Number of Occurrences:   1    Standing Expiration Date:   09/29/2018  . Kappa/lambda light chains    Standing Status:   Future    Number of Occurrences:   1    Standing Expiration Date:   09/29/2018    All questions were answered.  . The patient knows to call the clinic with any problems, questions or concerns.  This note was electronically signed.    History of Presenting Illness [name] [name] 56 y.o. 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 Patient's past medical history is significant for recurrent left empyema and pulmonary hypertension. Patient has been treated with VATS/decortication of the  left lung.Also has history or heterozygous alpha-1 Anti-trypsin deficiency, MZ phenotype, COPD.  Patient currently reports feeling well with no active complaints of fever, chills, night sweats. Denies any recent weight loss. Denies a change in activity tolerance or energy level. No significant changes in appetite. Denies abdominal pain, constipation, or diarrhea. No dysuria or hematuria. Denies any urological deficits.  Oncological/hematological History: --ECHO, 07/01/17: left ventricular ejection fraction 60 to 65%, normal Left ventricular wall thickness, impaired relaxation. --Labs, 08/26/17: SPEP -- restricted band in gamma globulins, suggestive of possible medical protein..  Medical History: Past Medical History:  Diagnosis Date  . Hypertension   . Kidney stones   . Pulmonary hypertension ([place])     Surgical History: Past Surgical History:  Procedure Laterality Date  . CARDIAC CATHETERIZATION N/A 01/18/2016   Procedure: Right Heart Cath;  Surgeon: Jolaine Artist, MD;  Location: [facility];  Service: Cardiovascular;  Laterality: N/A;  . CARDIAC CATHETERIZATION N/A 06/24/2016   Procedure: Right Heart Cath;  Surgeon: Jolaine Artist, MD;  Location: [facility];  Service: Cardiovascular;  Laterality: N/A;  . KIDNEY SURGERY    . VIDEO ASSISTED THORACOSCOPY (VATS)/DECORTICATION Left 2003  . VIDEO ASSISTED THORACOSCOPY (VATS)/DECORTICATION Left 10/02/2015   Procedure: VIDEO ASSISTED THORACOSCOPY (VATS)/DECORTICATION and drainage of chronic empyena;  Surgeon: [name], MD;  Location: [facility];  Service: Thoracic;  Laterality: Left;  Marland Kitchen VIDEO BRONCHOSCOPY N/A 10/02/2015  Procedure: VIDEO BRONCHOSCOPY;  Surgeon: [name], MD;  Location[facility];  Service: Thoracic;  Laterality: N/A;    Family History: Family History  Problem Relation Age of Onset  . Hypertension Other     Social History: Social History   Socioeconomic History  . Marital status:  Single    Spouse name: Not on file  . Number of children: Not on file  . Years of education: Not on file  . Highest education level: Not on file  Social Needs  . Financial resource strain: Not on file  . Food insecurity - worry: Not on file  . Food insecurity - inability: Not on file  . Transportation needs - medical: Not on file  . Transportation needs - non-medical: Not on file  Occupational History  . Not on file  Tobacco Use  . Smoking status: Never Smoker  . Smokeless tobacco: Never Used  Substance and Sexual Activity  . Alcohol use: Yes  . Drug use: No  . Sexual activity: Not on file  Other Topics Concern  . Not on file  Social History Narrative  . Not on file    Allergies: No Known Allergies  Medications:  Current Outpatient Medications  Medication Sig Dispense Refill  . albuterol (PROVENTIL HFA;VENTOLIN HFA) 108 (90 Base) MCG/ACT inhaler Inhale 2 puffs into the lungs every 6 (six) hours as needed for wheezing or shortness of breath. 1 Inhaler 2  . aspirin EC 325 MG EC tablet Take 1 tablet (325 mg total) by mouth daily. 30 tablet 3  . OXYGEN 2lpm BEDTIME ONLY    . tadalafil, PAH, (ADCIRCA) 20 MG tablet Take 2 tablets (40 mg total) by mouth daily. 60 tablet 11  . umeclidinium-vilanterol (ANORO ELLIPTA) 62.5-25 MCG/INH AEPB Inhale 1 puff into the lungs daily. 60 each 5  . umeclidinium-vilanterol (ANORO ELLIPTA) 62.5-25 MCG/INH AEPB Inhale 1 puff daily into the lungs. 1 each 5   No current facility-administered medications for this visit.     Review of Systems: Review of Systems  All other systems reviewed and are negative.    PHYSICAL EXAMINATION Blood pressure 125/89, pulse 81, temperature 97.6 F (36.4 C), temperature source Oral, resp. rate 17, height 5' (1.524 m), weight 94 lb 11.2 oz (43 kg), SpO2 98 %.  ECOG PERFORMANCE STATUS: 1 - Symptomatic but completely ambulatory  Physical Exam  Constitutional: He is oriented to person, place, and time and  well-developed, well-nourished, and in no distress. No distress.  HENT:  Head: Normocephalic and atraumatic.  Mouth/Throat: Oropharynx is clear and moist. No oropharyngeal exudate.  Eyes: Conjunctivae are normal. Pupils are equal, round, and reactive to light. No scleral icterus.  Neck: No thyromegaly present.  Cardiovascular: Normal rate, regular rhythm and normal heart sounds.  No murmur heard. Pulmonary/Chest:  decreased breath sounds diffuse over the left lung, right lung with basilar crackles. No expiratory wheezing.  Abdominal: Soft. Bowel sounds are normal. He exhibits no distension. There is no tenderness. There is no rebound.  Musculoskeletal: He exhibits no edema.  Lymphadenopathy:    He has no cervical adenopathy.  Neurological: He is alert and oriented to person, place, and time. He has normal reflexes. No cranial nerve deficit.  Skin: Skin is warm and dry. No rash noted. He is not diaphoretic. No erythema.     LABORATORY DATA: I have personally reviewed the data as listed: Appointment on 09/29/2017  Component Date Value Ref Range Status  . Ig Kappa Free Light Chain 09/29/2017 74.2* 3.3 -  19.4 mg/L Final  . Ig Lambda Free Light Chain 09/29/2017 15.8  5.7 - 26.3 mg/L Final  . Kappa/Lambda FluidC Ratio 09/29/2017 4.70* 0.26 - 1.65 Final  . IgG, Qn, Serum 09/29/2017 4,555* 700 - 1600 mg/dL Final  . IgA, Qn, Serum 09/29/2017 142  90 - 386 mg/dL Final  . IgM, Qn, Serum 09/29/2017 32  20 - 172 mg/dL Final  . Total Protein 09/29/2017 9.2* 6.0 - 8.5 g/dL Final  . Albumin SerPl Elph-Mcnc 09/29/2017 2.9  2.9 - 4.4 g/dL Final  . Alpha 1 09/29/2017 0.3  0.0 - 0.4 g/dL Final  . Alpha2 Glob SerPl Elph-Mcnc 09/29/2017 0.8  0.4 - 1.0 g/dL Final  . B-Globulin SerPl Elph-Mcnc 09/29/2017 1.1  0.7 - 1.3 g/dL Final  . Gamma Glob SerPl Elph-Mcnc 09/29/2017 4.0* 0.4 - 1.8 g/dL Final  . M Protein SerPl Elph-Mcnc 09/29/2017 3.4* Not Observed g/dL Final  . Globulin, Total 09/29/2017 6.3* 2.2 -  3.9 g/dL Final  . Albumin/Glob SerPl 09/29/2017 0.5* 0.7 - 1.7 Final  . IFE 1 09/29/2017 Comment   Final   Comment: Immunofixation shows IgG monoclonal protein with kappa light chain specificity.   . Please Note 09/29/2017 Comment   Final   Comment: Protein electrophoresis scan will follow via computer, mail, or courier delivery.   Marland Kitchen LDH 09/29/2017 169  125 - 245 U/L Final  . Sodium 09/29/2017 135* 136 - 145 mEq/L Final  . Potassium 09/29/2017 4.3  3.5 - 5.1 mEq/L Final  . Chloride 09/29/2017 100  98 - 109 mEq/L Final  . CO2 09/29/2017 26  22 - 29 mEq/L Final  . Glucose 09/29/2017 78  70 - 140 mg/dl Final   Glucose reference range is for nonfasting patients. Fasting glucose reference range is 70- 100.  Marland Kitchen BUN 09/29/2017 7.0  7.0 - 26.0 mg/dL Final  . Creatinine 09/29/2017 0.9  0.7 - 1.3 mg/dL Final  . Total Bilirubin 09/29/2017 0.27  0.20 - 1.20 mg/dL Final  . Alkaline Phosphatase 09/29/2017 75  40 - 150 U/L Final  . AST 09/29/2017 28  5 - 34 U/L Final  . ALT 09/29/2017 12  0 - 55 U/L Final  . Total Protein 09/29/2017 10.0* 6.4 - 8.3 g/dL Final  . Albumin 09/29/2017 2.7* 3.5 - 5.0 g/dL Final  . Calcium 09/29/2017 8.9  8.4 - 10.4 mg/dL Final  . Anion Gap 09/29/2017 9  3 - 11 mEq/L Final  . EGFR 09/29/2017 >60  >60 ml/min/1.73 m2 Final   eGFR is calculated using the CKD-EPI Creatinine Equation (2009)  . WBC 09/29/2017 5.5  4.0 - 10.3 10e3/uL Final  . NEUT# 09/29/2017 3.0  1.5 - 6.5 10e3/uL Final  . HGB 09/29/2017 12.2* 13.0 - 17.1 g/dL Final  . HCT 09/29/2017 36.7* 38.4 - 49.9 % Final  . Platelets 09/29/2017 286  140 - 400 10e3/uL Final  . MCV 09/29/2017 87.0  79.3 - 98.0 fL Final  . MCH 09/29/2017 28.9  27.2 - 33.4 pg Final  . MCHC 09/29/2017 33.2  32.0 - 36.0 g/dL Final  . RBC 09/29/2017 4.22  4.20 - 5.82 10e6/uL Final  . RDW 09/29/2017 14.8* 11.0 - 14.6 % Final  . lymph# 09/29/2017 1.7  0.9 - 3.3 10e3/uL Final  . MONO# 09/29/2017 0.4  0.1 - 0.9 10e3/uL Final  . Eosinophils  Absolute 09/29/2017 0.3  0.0 - 0.5 10e3/uL Final  . Basophils Absolute 09/29/2017 0.0  0.0 - 0.1 10e3/uL Final  . NEUT% 09/29/2017 55.3  39.0 -  75.0 % Final  . LYMPH% 09/29/2017 31.5  14.0 - 49.0 % Final  . MONO% 09/29/2017 6.8  0.0 - 14.0 % Final  . EOS% 09/29/2017 6.0  0.0 - 7.0 % Final  . BASO% 09/29/2017 0.4  0.0 - 2.0 % Final         Ardath Sax, MD

## 2017-10-12 NOTE — Assessment & Plan Note (Signed)
56 y.o.  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 Will initiate evaluation by repeating labs as outlined below.  Previously noted IgG levels that were elevated or more likely consistent with previous infections and by themselves not constitute a monoclonal gammopathy nor do they raise suspicion of multiple myeloma in absence of other laboratory signs or symptoms of the disease.  Plan: --Labs as outlined below -- return to clinic among week to review the findings.

## 2017-10-13 ENCOUNTER — Ambulatory Visit (HOSPITAL_COMMUNITY): Payer: Self-pay

## 2017-10-17 DIAGNOSIS — Z736 Limitation of activities due to disability: Secondary | ICD-10-CM

## 2017-10-21 ENCOUNTER — Ambulatory Visit (HOSPITAL_COMMUNITY)
Admission: RE | Admit: 2017-10-21 | Discharge: 2017-10-21 | Disposition: A | Discharge status: Home / Self Care | Payer: Self-pay | Source: Ambulatory Visit | Attending: Hematology and Oncology | Admitting: Hematology and Oncology

## 2017-10-21 DIAGNOSIS — J869 Pyothorax without fistula: Secondary | ICD-10-CM | POA: Insufficient documentation

## 2017-10-21 DIAGNOSIS — M899 Disorder of bone, unspecified: Secondary | ICD-10-CM | POA: Insufficient documentation

## 2017-10-21 DIAGNOSIS — D472 Monoclonal gammopathy: Secondary | ICD-10-CM | POA: Insufficient documentation

## 2017-10-21 LAB — GLUCOSE, CAPILLARY: Glucose-Capillary: 94 mg/dL (ref 65–99)

## 2017-10-21 MED ORDER — FLUDEOXYGLUCOSE F - 18 (FDG) INJECTION
5.7900 | Freq: Once | INTRAVENOUS | Status: AC | PRN
  Administered 2017-10-21: 5.79 via INTRAVENOUS

## 2017-10-22 ENCOUNTER — Ambulatory Visit (HOSPITAL_BASED_OUTPATIENT_CLINIC_OR_DEPARTMENT_OTHER): Payer: Self-pay | Admitting: Hematology

## 2017-10-22 ENCOUNTER — Telehealth: Payer: Self-pay | Admitting: Hematology

## 2017-10-22 ENCOUNTER — Encounter: Payer: Self-pay | Admitting: Hematology

## 2017-10-22 VITALS — BP 116/76 | HR 87 | Temp 98.1°F | Resp 16 | Ht 60.0 in | Wt 95.4 lb

## 2017-10-22 DIAGNOSIS — M545 Low back pain, unspecified: Secondary | ICD-10-CM

## 2017-10-22 DIAGNOSIS — Z7289 Other problems related to lifestyle: Secondary | ICD-10-CM

## 2017-10-22 DIAGNOSIS — R079 Chest pain, unspecified: Secondary | ICD-10-CM

## 2017-10-22 DIAGNOSIS — D472 Monoclonal gammopathy: Secondary | ICD-10-CM

## 2017-10-22 DIAGNOSIS — Z87891 Personal history of nicotine dependence: Secondary | ICD-10-CM

## 2017-10-22 DIAGNOSIS — G893 Neoplasm related pain (acute) (chronic): Secondary | ICD-10-CM

## 2017-10-22 DIAGNOSIS — C9000 Multiple myeloma not having achieved remission: Secondary | ICD-10-CM

## 2017-10-22 MED ORDER — TRAMADOL HCL 50 MG PO TABS: 50.0000 mg | ORAL_TABLET | Freq: Four times a day (QID) | ORAL | 0 refills | Status: DC | PRN

## 2017-10-22 MED ORDER — DEXAMETHASONE 4 MG PO TABS: 20.0000 mg | ORAL_TABLET | ORAL | 0 refills | Status: DC

## 2017-10-22 NOTE — Telephone Encounter (Signed)
Gave avs and calendar for December and January  °

## 2017-10-22 NOTE — Progress Notes (Signed)
HEMATOLOGY ONCOLOGY CLINIC NOTE  DOS .10/22/2017  History of Presenting Illness [name] [name] 56 y.o. 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 Patient's past medical history is significant for recurrent left empyema and pulmonary hypertension. Patient has been treated with VATS/decortication of the left lung.Also has history or heterozygous alpha-1 Anti-trypsin deficiency, MZ phenotype, COPD.  Patient currently reports feeling well with no active complaints of fever, chills, night sweats. Denies any recent weight loss. Denies a change in activity tolerance or energy level. No significant changes in appetite. Denies abdominal pain, constipation, or diarrhea. No dysuria or hematuria. Denies any urological deficits.  Oncological/hematological History: --ECHO, 07/01/17: left ventricular ejection fraction 60 to 65%, normal Left ventricular wall thickness, impaired relaxation. --Labs, 08/26/17: SPEP -- restricted band in gamma globulins, suggestive of possible medical protein.  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 He is being seen at the request of Dr Lebron Conners who is on holiday. In the interim, the patient reports that he is doing so-so. He does admit to some midline lower back pain. His pain as mostly remained that same. Additionally, in 2016 he had a VATS performed and he also endorses some stiffness and pin prick type pain to the left lateral chest wall. He has a distant history of smoking. He is not aware of any liver issue, but he reports that he drinks anywhere from 3-7 16oz beers a day.  PET/CT results were reviewed in details with the patient with the help of [employer] interpreter. BM Bx is scheduled for 11/03/2017  On review of systems, pt denies fever, chills, rash, mouth sores, weight loss, decreased appetite, urinary complaints. He reports ongoing pain within the lumbar region of his back. Pt denies abdominal pain,  nausea, vomiting. He denies any dental pain. Pertinent positives are listed and detailed within the above HPI.   Medical History: Past Medical History:  Diagnosis Date  . Hypertension   . Kidney stones   . Pulmonary hypertension ([place])     Surgical History: Past Surgical History:  Procedure Laterality Date  . CARDIAC CATHETERIZATION N/A 01/18/2016   Procedure: Right Heart Cath;  Surgeon: Jolaine Artist, MD;  Location: [facility];  Service: Cardiovascular;  Laterality: N/A;  . CARDIAC CATHETERIZATION N/A 06/24/2016   Procedure: Right Heart Cath;  Surgeon: Jolaine Artist, MD;  Location: [facility];  Service: Cardiovascular;  Laterality: N/A;  . KIDNEY SURGERY    . VIDEO ASSISTED THORACOSCOPY (VATS)/DECORTICATION Left 2003  . VIDEO ASSISTED THORACOSCOPY (VATS)/DECORTICATION Left 10/02/2015   Procedure: VIDEO ASSISTED THORACOSCOPY (VATS)/DECORTICATION and drainage of chronic empyena;  Surgeon: [name], MD;  Location: [facility];  Service: Thoracic;  Laterality: Left;  Marland Kitchen VIDEO BRONCHOSCOPY N/A 10/02/2015   Procedure: VIDEO BRONCHOSCOPY;  Surgeon: [name], MD;  Location: [facility];  Service: Thoracic;  Laterality: N/A;    Family History: Family History  Problem Relation Age of Onset  . Hypertension Other     Social History: Social History   Socioeconomic History  . Marital status: Single    Spouse name: Not on file  . Number of children: Not on file  . Years of education: Not on file  . Highest education level: Not on file  Social Needs  . Financial resource strain: Not on file  . Food insecurity - worry: Not on file  . Food insecurity - inability: Not on file  . Transportation needs - medical:  Not on file  . Transportation needs - non-medical: Not on file  Occupational History  . Not on file  Tobacco Use  . Smoking status: Never Smoker  . Smokeless tobacco: Never Used  Substance and Sexual Activity  . Alcohol use: Yes  . Drug use: No  .  Sexual activity: Not on file  Other Topics Concern  . Not on file  Social History Narrative  . Not on file    Allergies: No Known Allergies  Medications:  Current Outpatient Medications  Medication Sig Dispense Refill  . albuterol (PROVENTIL HFA;VENTOLIN HFA) 108 (90 Base) MCG/ACT inhaler Inhale 2 puffs into the lungs every 6 (six) hours as needed for wheezing or shortness of breath. 1 Inhaler 2  . OXYGEN 2lpm BEDTIME ONLY    . tadalafil, PAH, (ADCIRCA) 20 MG tablet Take 2 tablets (40 mg total) by mouth daily. 60 tablet 11  . umeclidinium-vilanterol (ANORO ELLIPTA) 62.5-25 MCG/INH AEPB Inhale 1 puff into the lungs daily. 60 each 5  . umeclidinium-vilanterol (ANORO ELLIPTA) 62.5-25 MCG/INH AEPB Inhale 1 puff daily into the lungs. 1 each 5  . aspirin EC 325 MG EC tablet Take 1 tablet (325 mg total) by mouth daily. 30 tablet 3  . dexamethasone (DECADRON) 4 MG tablet Take 5 tablets (20 mg total) by mouth once a week. 20 tablet 0  . traMADol (ULTRAM) 50 MG tablet Take 1 tablet (50 mg total) by mouth every 6 (six) hours as needed. 30 tablet 0   No current facility-administered medications for this visit.    Facility-Administered Medications Ordered in Other Visits  Medication Dose Route Frequency Provider Last Rate Last Dose  . 0.9 %  sodium chloride infusion   Intravenous Continuous Monia Sabal, PA-C   Stopped at 11/03/17 1000  . fentaNYL (SUBLIMAZE) 100 MCG/2ML injection           . midazolam (VERSED) 2 MG/2ML injection             Review of Systems: Review of Systems  Constitutional: Negative for fever.  HENT:   Negative for lump/mass.   Respiratory: Negative for shortness of breath.   Cardiovascular: Positive for chest pain.  Gastrointestinal: Negative for diarrhea, nausea and vomiting.  Genitourinary: Negative for bladder incontinence and difficulty urinating.   Musculoskeletal: Positive for arthralgias and back pain.  All other systems reviewed and are negative.  PHYSICAL  EXAMINATION Blood pressure 116/76, pulse 87, temperature 98.1 F (36.7 C), temperature source Oral, resp. rate 16, height 5' (1.524 m), weight 95 lb 6.4 oz (43.3 kg), SpO2 95 %.  ECOG PERFORMANCE STATUS: 1 - Symptomatic but completely ambulatory  Physical Exam  Constitutional: He is oriented to person, place, and time and well-developed, well-nourished, and in no distress. No distress.  HENT:  Head: Normocephalic and atraumatic.  Mouth/Throat: Oropharynx is clear and moist. No oropharyngeal exudate.  Eyes: Conjunctivae are normal. Pupils are equal, round, and reactive to light. No scleral icterus.  Neck: No thyromegaly present.  Cardiovascular: Normal rate, regular rhythm and normal heart sounds.  No murmur heard. Pulmonary/Chest:  decreased breath sounds diffuse over the left lung, right lung with basilar crackles. No expiratory wheezing.  Abdominal: Soft. Bowel sounds are normal. He exhibits no distension. There is no tenderness. There is no rebound.  Musculoskeletal: He exhibits tenderness. He exhibits no edema.  Mild TTP in the lower back around the L3-L4 region.   Lymphadenopathy:    He has no cervical adenopathy.  Neurological: He is alert and oriented to  person, place, and time. He has normal reflexes. No cranial nerve deficit.  Skin: Skin is warm and dry. No rash noted. He is not diaphoretic. No erythema.     LABORATORY DATA:  . CBC Latest Ref Rng & Units 09/29/2017 11/09/2016  WBC 4.0 - 10.5 K/uL 5.5 6.4  Hemoglobin 13.0 - 17.0 g/dL 12.2(L) 13.9  Hematocrit 39.0 - 52.0 % 36.7(L) 41.8  Platelets 150 - 400 K/uL 286 161   . CMP Latest Ref Rng & Units 09/29/2017 09/29/2017 08/26/2017  Glucose 70 - 140 mg/dl 78 - -  BUN 7.0 - 26.0 mg/dL 7.0 - -  Creatinine 0.7 - 1.3 mg/dL 0.9 - -  Sodium 136 - 145 mEq/L 135(L) - -  Potassium 3.5 - 5.1 mEq/L 4.3 - -  Chloride 101 - 111 mmol/L - - -  CO2 22 - 29 mEq/L 26 - -  Calcium 8.4 - 10.4 mg/dL 8.9 - -  Total Protein 6.4 - 8.3 g/dL  10.0(H) 9.2(H) 9.3(H)  Total Bilirubin 0.20 - 1.20 mg/dL 0.27 - -  Alkaline Phos 40 - 150 U/L 75 - -  AST 5 - 34 U/L 28 - -  ALT 0 - 55 U/L 12 - -       . Lab Results  Component Value Date   LDH 169 09/29/2017    RADIOLOGY  .Nm Pet Image Initial (pi) Whole Body  Result Date: 10/21/2017 CLINICAL DATA:  Subsequent treatment strategy for multiple myeloma. EXAM: NUCLEAR MEDICINE PET WHOLE BODY TECHNIQUE: 5.79 mCi F-18 FDG was injected intravenously. Full-ring PET imaging was performed from the vertex to the feet after the radiotracer. CT data was obtained and used for attenuation correction and anatomic localization. FASTING BLOOD GLUCOSE:  Value: 94 mg/dl COMPARISON:  CTA chest dated 11/06/2016. FINDINGS: HEAD/NECK No hypermetabolic cervical lymphadenopathy. CHEST Chronic thick-walled left pleural collection with complex fluid and gas, corresponding to the patient's known empyema. Associated mild hypermetabolism. Mild bronchiectasis in the bilateral lower lobes. No suspicious pulmonary nodules. The heart is normal in size.  No pericardial effusion. ABDOMEN/PELVIS No abnormal hypermetabolic activity within the liver, pancreas, adrenal glands, or spleen. No hypermetabolic lymph nodes in the abdomen or pelvis. SKELETON 15 mm lytic lesion in the left L4 vertebral body (series 4/image 137), max SUV 4.0. Mild metabolism in the L5 vertebral body, max SUV 3.2, without CT correlate. Mild asymmetric hypermetabolism in the left T12 vertebral body, max SUV 2.7, without CT correlate. EXTREMITIES No abnormal hypermetabolic activity in the lower extremities. IMPRESSION: 15 mm lytic lesion with hypermetabolism in the left L4 vertebral body, suspicious for multiple myeloma. Chronic empyema with gas/fluid in the left hemithorax. Electronically Signed   By: Julian Hy M.D.   On: 10/21/2017 11:54   Ct Biopsy  Result Date: 11/03/2017 INDICATION: MGUS. Concern for multiple myeloma. Please perform CT-guided  bone marrow biopsy for tissue diagnostic purposes. EXAM: CT-GUIDED BONE MARROW BIOPSY AND ASPIRATION MEDICATIONS: None ANESTHESIA/SEDATION: Fentanyl 100 mcg IV; Versed 2 mg IV Sedation Time: 12 minutes; The patient was continuously monitored during the procedure by the interventional radiology nurse under my direct supervision. COMPLICATIONS: None immediate. PROCEDURE: Informed consent was obtained from the patient following an explanation of the procedure, risks, benefits and alternatives. The patient understands, agrees and consents for the procedure. All questions were addressed. A time out was performed prior to the initiation of the procedure. The patient was positioned prone and non-contrast localization CT was performed of the pelvis to demonstrate the iliac marrow spaces. The operative site  was prepped and draped in the usual sterile fashion. Under sterile conditions and local anesthesia, a 22 gauge spinal needle was utilized for procedural planning. Next, an 11 gauge coaxial bone biopsy needle was advanced into the left iliac marrow space. Needle position was confirmed with CT imaging. Initially, bone marrow aspiration was performed. Next, a bone marrow biopsy was obtained with the 11 gauge outer bone marrow device. Samples were prepared with the cytotechnologist and deemed adequate. The needle was removed intact. Hemostasis was obtained with compression and a dressing was placed. The patient tolerated the procedure well without immediate post procedural complication. IMPRESSION: Successful CT guided left iliac bone marrow aspiration and core biopsy. Electronically Signed   By: Sandi Mariscal M.D.   On: 11/03/2017 10:08   Ct Bone Marrow Biopsy & Aspiration  Result Date: 11/03/2017 INDICATION: MGUS. Concern for multiple myeloma. Please perform CT-guided bone marrow biopsy for tissue diagnostic purposes. EXAM: CT-GUIDED BONE MARROW BIOPSY AND ASPIRATION MEDICATIONS: None ANESTHESIA/SEDATION: Fentanyl 100  mcg IV; Versed 2 mg IV Sedation Time: 12 minutes; The patient was continuously monitored during the procedure by the interventional radiology nurse under my direct supervision. COMPLICATIONS: None immediate. PROCEDURE: Informed consent was obtained from the patient following an explanation of the procedure, risks, benefits and alternatives. The patient understands, agrees and consents for the procedure. All questions were addressed. A time out was performed prior to the initiation of the procedure. The patient was positioned prone and non-contrast localization CT was performed of the pelvis to demonstrate the iliac marrow spaces. The operative site was prepped and draped in the usual sterile fashion. Under sterile conditions and local anesthesia, a 22 gauge spinal needle was utilized for procedural planning. Next, an 11 gauge coaxial bone biopsy needle was advanced into the left iliac marrow space. Needle position was confirmed with CT imaging. Initially, bone marrow aspiration was performed. Next, a bone marrow biopsy was obtained with the 11 gauge outer bone marrow device. Samples were prepared with the cytotechnologist and deemed adequate. The needle was removed intact. Hemostasis was obtained with compression and a dressing was placed. The patient tolerated the procedure well without immediate post procedural complication. IMPRESSION: Successful CT guided left iliac bone marrow aspiration and core biopsy. Electronically Signed   By: Sandi Mariscal M.D.   On: 11/03/2017 10:08     ASSESSMENT AND PLAN  The patient is a 56 y.o. male who presents into the clinic today to discuss the following:   1) IgG kappa Multiple Myeloma  M spike of 3.4g/dl No signiifcant anemia, renal failure or hypercalcemia at this time PET/CT shows - 15 mm lytic lesion with hypermetabolism in the left L4 vertebral body, suspicious for multiple myeloma.  2) Lower back pain - ? Related to L4 lesion.  PLAN -I discussed with the  patient and his girlfriend via Physiological scientist the details of his current condition at great length.  -we discussed his available lab results, PET/CT and likely diagnosis of multiple myeloma , its natural history, treatment rationale and consideration. -Most recent PET does confirm a 38m lytic lesions at the level of L4. -He does not currently have signs or lab values suggesting hypercalcemia, anemia, or declining renal functioning.  -He currently has a bone marrow exam scheduled for Dec 31st, and we will tentatively begin treatment following receiving the results of this.  -We will also begin him on Dexamethasone (243mweekly) as well while we wait these results to help with back pain -I will also give him  a small amount of a Tramadol prn for his back pain and for the lesion within L4.  -his girl friend was particularly interested in the details of the pain medication and came across as a concerning partner with care to watch for drug abuse. -I communicate via interpreter separately to let patient know to appropriate manage his pain medication and not have anyone else use them.  All questions were answered.  F/u for bone marrow aspiration/biopsy on 11/03/2017 RTC with Dr Irene Limbo 11/07/2016 with labs Chemotherapy Velcade D1,4,8,11 Q21days starting from 11/10/2016 Chemo-counseling for RVd in 1 week Will also need to consider starting him on Zometa IV  The patient knows to call the clinic with any problems, questions or concerns. TT spent in direct contact , counseling and co-ordinating cares for new diagnosis of myeloma 93mns   Gautam Kale MD MS AAHIVMS SFranklin Endoscopy Center LLCCFort Belvoir Community HospitalHematology/Oncology Physician CWarroad This document serves as a record of services personally performed by GSullivan Lone MD. It was created on his behalf by WReola Mosher a trained medical scribe. The creation of this record is based on the scribe's personal observations and the provider's statements to them.    .I have reviewed the above documentation for accuracy and completeness, and I agree with the above. .Brunetta GeneraMD MS

## 2017-10-27 ENCOUNTER — Other Ambulatory Visit: Payer: Self-pay

## 2017-10-31 ENCOUNTER — Other Ambulatory Visit: Payer: Self-pay | Admitting: Student

## 2017-10-31 ENCOUNTER — Other Ambulatory Visit: Payer: Self-pay | Admitting: Radiology

## 2017-11-03 ENCOUNTER — Encounter (HOSPITAL_COMMUNITY): Payer: Self-pay

## 2017-11-03 ENCOUNTER — Ambulatory Visit (HOSPITAL_COMMUNITY)
Admission: RE | Admit: 2017-11-03 | Discharge: 2017-11-03 | Disposition: A | Discharge status: Home / Self Care | Payer: Self-pay | Source: Ambulatory Visit | Attending: Hematology and Oncology | Admitting: Hematology and Oncology

## 2017-11-03 DIAGNOSIS — C9000 Multiple myeloma not having achieved remission: Secondary | ICD-10-CM | POA: Insufficient documentation

## 2017-11-03 DIAGNOSIS — I10 Essential (primary) hypertension: Secondary | ICD-10-CM | POA: Insufficient documentation

## 2017-11-03 DIAGNOSIS — I2781 Cor pulmonale (chronic): Secondary | ICD-10-CM | POA: Insufficient documentation

## 2017-11-03 DIAGNOSIS — D472 Monoclonal gammopathy: Secondary | ICD-10-CM

## 2017-11-03 DIAGNOSIS — I272 Pulmonary hypertension, unspecified: Secondary | ICD-10-CM | POA: Insufficient documentation

## 2017-11-03 LAB — APTT: APTT: 32 s (ref 24–36)

## 2017-11-03 LAB — CBC WITH DIFFERENTIAL/PLATELET
BASOS ABS: 0 10*3/uL (ref 0.0–0.1)
BASOS PCT: 0 %
EOS ABS: 0.5 10*3/uL (ref 0.0–0.7)
Eosinophils Relative: 8 %
HEMATOCRIT: 38.9 % — AB (ref 39.0–52.0)
HEMOGLOBIN: 13.3 g/dL (ref 13.0–17.0)
Lymphocytes Relative: 25 %
Lymphs Abs: 1.4 10*3/uL (ref 0.7–4.0)
MCH: 29.4 pg (ref 26.0–34.0)
MCHC: 34.2 g/dL (ref 30.0–36.0)
MCV: 86.1 fL (ref 78.0–100.0)
MONOS PCT: 9 %
Monocytes Absolute: 0.5 10*3/uL (ref 0.1–1.0)
NEUTROS ABS: 3.2 10*3/uL (ref 1.7–7.7)
NEUTROS PCT: 58 %
Platelets: 297 10*3/uL (ref 150–400)
RBC: 4.52 MIL/uL (ref 4.22–5.81)
RDW: 14.5 % (ref 11.5–15.5)
WBC: 5.6 10*3/uL (ref 4.0–10.5)

## 2017-11-03 LAB — PROTIME-INR
INR: 1.05
PROTHROMBIN TIME: 13.6 s (ref 11.4–15.2)

## 2017-11-03 MED ORDER — SODIUM CHLORIDE 0.9 % IV SOLN
INTRAVENOUS | Status: DC
  Administered 2017-11-03: 08:00:00 via INTRAVENOUS

## 2017-11-03 MED ORDER — MIDAZOLAM HCL 2 MG/2ML IJ SOLN
INTRAMUSCULAR | Status: AC | PRN
  Administered 2017-11-03 (×2): 1 mg via INTRAVENOUS

## 2017-11-03 MED ORDER — FENTANYL CITRATE (PF) 100 MCG/2ML IJ SOLN
INTRAMUSCULAR | Status: AC | PRN
  Administered 2017-11-03 (×2): 50 ug via INTRAVENOUS

## 2017-11-03 MED ORDER — LIDOCAINE HCL (PF) 1 % IJ SOLN
INTRAMUSCULAR | Status: AC | PRN
  Administered 2017-11-03: 30 mL

## 2017-11-03 MED ORDER — FENTANYL CITRATE (PF) 100 MCG/2ML IJ SOLN
INTRAMUSCULAR | Status: AC
  Filled 2017-11-03: qty 2

## 2017-11-03 MED ORDER — MIDAZOLAM HCL 2 MG/2ML IJ SOLN
INTRAMUSCULAR | Status: AC
  Filled 2017-11-03: qty 2

## 2017-11-03 NOTE — Discharge Instructions (Signed)
You may take dressing off and shower in 24 hours.   Bone Marrow Aspiration and Bone Marrow Biopsy, Adult, Care After  This sheet gives you information about how to care for yourself after your procedure. Your health care provider may also give you more specific instructions. If you have problems or questions, contact your health care provider. What can I expect after the procedure? After the procedure, it is common to have:  Mild pain and tenderness.  Swelling.  Bruising.  Follow these instructions at home:  Take over-the-counter or prescription medicines only as told by your health care provider.  Do not take baths, swim, or use a hot tub until your health care provider approves. Ask if you can take a shower or have a sponge bath.  Follow instructions from your health care provider about how to take care of the puncture site. Make sure you: ? Wash your hands with soap and water before you change your bandage (dressing). If soap and water are not available, use hand sanitizer. ? Change your dressing as told by your health care provider.  Check your puncture siteevery day for signs of infection. Check for: ? More redness, swelling, or pain. ? More fluid or blood. ? Warmth. ? Pus or a bad smell.  Return to your normal activities as told by your health care provider. Ask your health care provider what activities are safe for you.  Do not drive for 24 hours if you were given a medicine to help you relax (sedative).  Keep all follow-up visits as told by your health care provider. This is important. Contact a health care provider if:  You have more redness, swelling, or pain around the puncture site.  You have more fluid or blood coming from the puncture site.  Your puncture site feels warm to the touch.  You have pus or a bad smell coming from the puncture site.  You have a fever.  Your pain is not controlled with medicine. This information is not intended to replace  advice given to you by your health care provider. Make sure you discuss any questions you have with your health care provider. Document Released: 05/10/2005 Document Revised: 05/10/2016 Document Reviewed: 04/03/2016 Elsevier Interactive Patient Education  2018 [employer].   Moderate Conscious Sedation, Adult, Care After These instructions provide you with information about caring for yourself after your procedure. Your health care provider may also give you more specific instructions. Your treatment has been planned according to current medical practices, but problems sometimes occur. Call your health care provider if you have any problems or questions after your procedure. What can I expect after the procedure? After your procedure, it is common:  To feel sleepy for several hours.  To feel clumsy and have poor balance for several hours.  To have poor judgment for several hours.  To vomit if you eat too soon.  Follow these instructions at home: For at least 24 hours after the procedure:   Do not: ? Participate in activities where you could fall or become injured. ? Drive. ? Use heavy machinery. ? Drink alcohol. ? Take sleeping pills or medicines that cause drowsiness. ? Make important decisions or sign legal documents. ? Take care of children on your own.  Rest. Eating and drinking  Follow the diet recommended by your health care provider.  If you vomit: ? Drink water, juice, or soup when you can drink without vomiting. ? Make sure you have little or no nausea before eating  solid foods. General instructions  Have a responsible adult stay with you until you are awake and alert.  Take over-the-counter and prescription medicines only as told by your health care provider.  If you smoke, do not smoke without supervision.  Keep all follow-up visits as told by your health care provider. This is important. Contact a health care provider if:  You keep feeling nauseous or  you keep vomiting.  You feel light-headed.  You develop a rash.  You have a fever. Get help right away if:  You have trouble breathing. This information is not intended to replace advice given to you by your health care provider. Make sure you discuss any questions you have with your health care provider. Document Released: 08/11/2013 Document Revised: 03/25/2016 Document Reviewed: 02/10/2016 Elsevier Interactive Patient Education  [employer].

## 2017-11-03 NOTE — Procedures (Signed)
Pre-procedure Diagnosis: Multiple Myeloma Post-procedure Diagnosis: Same  Technically successful CT guided bone marrow aspiration and biopsy of left iliac crest.   Complications: None Immediate  EBL: None  SignedSandi Mariscal Pager: [phone] 11/03/2017, 9:47 AM

## 2017-11-03 NOTE — H&P (Signed)
Chief Complaint: MGUS  Referring Physician:Dr. [name]  Supervising Physician: Sandi Mariscal  Patient Status: [facility] - Out-pt  HPI: [name] [name] is a 56 y.o. male who has a history of pulmonary HTN with cor pulmonale, HTN, previous empyema that required chest tube and VATS in 2016 who has been found to possibly have MGUS.  He is followed by Dr. Lebron Conners and a request has been made for a BMBX.  The patient states he has frequent chest pain with rest and with exertion.  He is seeing Dr. Haroldine Laws for his cardiac issues.  He was just recently seen on 10/01/17.  He complains of some SOB with chest pain, but denies fevers, chills, HA, abdominal pain.  Past Medical History:  Past Medical History:  Diagnosis Date  . Hypertension   . Kidney stones   . Pulmonary hypertension ([place])     Past Surgical History:  Past Surgical History:  Procedure Laterality Date  . CARDIAC CATHETERIZATION N/A 01/18/2016   Procedure: Right Heart Cath;  Surgeon: Jolaine Artist, MD;  Location: [place] [facility];  Service: Cardiovascular;  Laterality: N/A;  . CARDIAC CATHETERIZATION N/A 06/24/2016   Procedure: Right Heart Cath;  Surgeon: Jolaine Artist, MD;  Location: [place] [facility];  Service: Cardiovascular;  Laterality: N/A;  . KIDNEY SURGERY    . VIDEO ASSISTED THORACOSCOPY (VATS)/DECORTICATION Left 2003  . VIDEO ASSISTED THORACOSCOPY (VATS)/DECORTICATION Left 10/02/2015   Procedure: VIDEO ASSISTED THORACOSCOPY (VATS)/DECORTICATION and drainage of chronic empyena;  Surgeon: [name], MD;  Location: [place];  Service: Thoracic;  Laterality: Left;  Marland Kitchen VIDEO BRONCHOSCOPY N/A 10/02/2015   Procedure: VIDEO BRONCHOSCOPY;  Surgeon: [name], MD;  Location: [facility];  Service: Thoracic;  Laterality: N/A;    Family History:  Family History  Problem Relation Age of Onset  . Hypertension Other     Social History:  reports that  has never smoked. he has never used smokeless tobacco. He reports  that he drinks alcohol. He reports that he does not use drugs.  Allergies: No Known Allergies  Medications: Medications reviewed in epic  Please HPI for pertinent positives, otherwise complete 10 system ROS negative.  Mallampati Score: MD Evaluation Airway: WNL Heart: WNL Abdomen: WNL Chest/ Lungs: WNL ASA  Classification: 3 Mallampati/Airway Score: Two  Physical Exam: BP 138/90   Pulse 92   Temp 98.2 F (36.8 C) (Oral)   Resp 16   Ht 5' (1.524 m)   Wt 95 lb (43.1 kg)   SpO2 94%   BMI 18.55 kg/m  Body mass index is 18.55 kg/m. General: pleasant, WD, WN male who is laying in bed in NAD HEENT: head is normocephalic, atraumatic.  Sclera are noninjected.  PERRL.  Ears and nose without any masses or lesions.  Mouth is pink and moist Heart: regular, rate, and rhythm.  Normal s1,s2. No obvious murmurs, gallops, or rubs noted.  Palpable radial pulses bilaterally Lungs: CTAB, no wheezes, rhonchi, or rales noted.  Respiratory effort nonlabored Abd: soft, NT, ND, +BS, no masses, hernias, or organomegaly  Psych: A&Ox3 with an appropriate affect.   Labs: Results for orders placed or performed during the hospital encounter of 11/03/17 (from the past 48 hour(s))  APTT upon arrival     Status: None   Collection Time: 11/03/17  7:28 AM  Result Value Ref Range   aPTT 32 24 - 36 seconds  Protime-INR upon arrival     Status: None   Collection Time: 11/03/17  7:28 AM  Result Value Ref Range   Prothrombin Time 13.6 11.4 - 15.2 seconds   INR 1.05   CBC with Differential/Platelet     Status: Abnormal   Collection Time: 11/03/17  7:28 AM  Result Value Ref Range   WBC 5.6 4.0 - 10.5 K/uL   RBC 4.52 4.22 - 5.81 MIL/uL   Hemoglobin 13.3 13.0 - 17.0 g/dL   HCT 38.9 (L) 39.0 - 52.0 %   MCV 86.1 78.0 - 100.0 fL   MCH 29.4 26.0 - 34.0 pg   MCHC 34.2 30.0 - 36.0 g/dL   RDW 14.5 11.5 - 15.5 %   Platelets 297 150 - 400 K/uL   Neutrophils Relative % 58 %   Neutro Abs 3.2 1.7 - 7.7 K/uL    Lymphocytes Relative 25 %   Lymphs Abs 1.4 0.7 - 4.0 K/uL   Monocytes Relative 9 %   Monocytes Absolute 0.5 0.1 - 1.0 K/uL   Eosinophils Relative 8 %   Eosinophils Absolute 0.5 0.0 - 0.7 K/uL   Basophils Relative 0 %   Basophils Absolute 0.0 0.0 - 0.1 K/uL    Imaging: No results found.  Assessment/Plan 1. MGUS  Proceed with a BMBX today.  His labs and vitals have been reviewed.  The patient does have chest pain, but is currently under the supervision of a cardiologist who manages these issues.   Risks and benefits discussed with the patient including, but not limited to bleeding, infection, damage to adjacent structures or low yield requiring additional tests. All of the patient's questions were answered, patient is agreeable to proceed. Consent signed and in chart.   Thank you for this interesting consult.  I greatly enjoyed meeting [name] [name] and look forward to participating in their care.  A copy of this report was sent to the requesting provider on this date.  Electronically Signed: Henreitta Cea 11/03/2017, 8:54 AM   I spent a total of  30 Minutes   in face to face in clinical consultation, greater than 50% of which was counseling/coordinating care for MGUS

## 2017-11-03 NOTE — Progress Notes (Addendum)
Patient speaks [place]. Attempted to communicate with interpreter via the solaris system but he was unable to communicate with patient. Patient has friend Anderson Malta that can communicate with patient in [place]. I was able to do my assessment and ask all questions needed with the help of patients friend Anderson Malta. The hospital interpreter Leodis Liverpool Eban) then showed up and friend stepped out allowing me to ask all of the psychosocial and suicide risk questions. With the help of the interpreter, I went back over all assessment questions with patients making sure nothing was missed considering the language barrier.

## 2017-11-06 ENCOUNTER — Other Ambulatory Visit: Payer: Self-pay | Admitting: Hematology

## 2017-11-07 ENCOUNTER — Encounter: Payer: Self-pay | Admitting: Hematology

## 2017-11-07 ENCOUNTER — Other Ambulatory Visit (HOSPITAL_BASED_OUTPATIENT_CLINIC_OR_DEPARTMENT_OTHER): Payer: Self-pay

## 2017-11-07 ENCOUNTER — Inpatient Hospital Stay (HOSPITAL_BASED_OUTPATIENT_CLINIC_OR_DEPARTMENT_OTHER): Payer: Self-pay | Admitting: Hematology

## 2017-11-07 VITALS — BP 143/80 | HR 80 | Temp 98.4°F | Resp 18 | Ht 60.0 in | Wt 96.5 lb

## 2017-11-07 DIAGNOSIS — R1310 Dysphagia, unspecified: Secondary | ICD-10-CM

## 2017-11-07 DIAGNOSIS — C9000 Multiple myeloma not having achieved remission: Secondary | ICD-10-CM | POA: Insufficient documentation

## 2017-11-07 DIAGNOSIS — Z7189 Other specified counseling: Secondary | ICD-10-CM

## 2017-11-07 DIAGNOSIS — G893 Neoplasm related pain (acute) (chronic): Secondary | ICD-10-CM

## 2017-11-07 DIAGNOSIS — M545 Low back pain: Secondary | ICD-10-CM

## 2017-11-07 LAB — CBC & DIFF AND RETIC
BASO%: 0.4 % (ref 0.0–2.0)
BASOS ABS: 0 10*3/uL (ref 0.0–0.1)
EOS ABS: 0.7 10*3/uL — AB (ref 0.0–0.5)
EOS%: 9.5 % — ABNORMAL HIGH (ref 0.0–7.0)
HEMATOCRIT: 39.7 % (ref 38.4–49.9)
HEMOGLOBIN: 13.1 g/dL (ref 13.0–17.1)
Immature Retic Fract: 11.7 % — ABNORMAL HIGH (ref 3.00–10.60)
LYMPH#: 1.7 10*3/uL (ref 0.9–3.3)
LYMPH%: 23.5 % (ref 14.0–49.0)
MCH: 28.9 pg (ref 27.2–33.4)
MCHC: 33 g/dL (ref 32.0–36.0)
MCV: 87.6 fL (ref 79.3–98.0)
MONO#: 0.8 10*3/uL (ref 0.1–0.9)
MONO%: 10.8 % (ref 0.0–14.0)
NEUT#: 4.1 10*3/uL (ref 1.5–6.5)
NEUT%: 55.8 % (ref 39.0–75.0)
Platelets: 320 10*3/uL (ref 140–400)
RBC: 4.53 10*6/uL (ref 4.20–5.82)
RDW: 14.5 % (ref 11.0–14.6)
RETIC CT ABS: 53.91 10*3/uL (ref 34.80–93.90)
Retic %: 1.19 % (ref 0.80–1.80)
WBC: 7.4 10*3/uL (ref 4.0–10.3)

## 2017-11-07 LAB — COMPREHENSIVE METABOLIC PANEL
ALT: 11 U/L (ref 0–55)
ANION GAP: 6 meq/L (ref 3–11)
AST: 24 U/L (ref 5–34)
Albumin: 2.7 g/dL — ABNORMAL LOW (ref 3.5–5.0)
Alkaline Phosphatase: 92 U/L (ref 40–150)
BUN: 12 mg/dL (ref 7.0–26.0)
CHLORIDE: 102 meq/L (ref 98–109)
CO2: 30 meq/L — AB (ref 22–29)
CREATININE: 0.9 mg/dL (ref 0.7–1.3)
Calcium: 8.5 mg/dL (ref 8.4–10.4)
Glucose: 90 mg/dl (ref 70–140)
Potassium: 4.1 mEq/L (ref 3.5–5.1)
Sodium: 138 mEq/L (ref 136–145)
Total Bilirubin: 0.43 mg/dL (ref 0.20–1.20)
Total Protein: 9.2 g/dL — ABNORMAL HIGH (ref 6.4–8.3)

## 2017-11-07 MED ORDER — TRAMADOL HCL 50 MG PO TABS: 50.0000 mg | ORAL_TABLET | Freq: Four times a day (QID) | ORAL | 0 refills | Status: DC | PRN

## 2017-11-07 NOTE — Progress Notes (Signed)
HEMATOLOGY ONCOLOGY CLINIC NOTE  DOS .10/22/2017  History of Presenting Illness [name] [name] 57 y.o. presenting to the [place] for Evaluation of suspected monoclonal myopathy. Patient's past medical history is significant for recurrent left empyema and pulmonary hypertension. Patient has been treated with VATS/decortication of the left lung.Also has history or heterozygous alpha-1 Anti-trypsin deficiency, MZ phenotype, COPD.  Patient currently reports feeling well with no active complaints of fever, chills, night sweats. Denies any recent weight loss. Denies a change in activity tolerance or energy level. No significant changes in appetite. Denies abdominal pain, constipation, or diarrhea. No dysuria or hematuria. Denies any urological deficits.  Oncological/hematological History: --ECHO, 07/01/17: left ventricular ejection fraction 60 to 65%, normal Left ventricular wall thickness, impaired relaxation. --Labs, 08/26/17: SPEP -- restricted band in gamma globulins, suggestive of possible medical protein.  INTERIM HISTORY:   [name] [name] presents today for routine follow up of his Myeloma. He is accompanied by his translator. Of note since his last visit, he underwent a bone marrow biopsy on 11/03/2017. He has been doing well overall. He states he noticed difficulty swallowing about 3 weeks ago with solid foods since he feels his mouth and throat is dry. Resolves after drinking water.  He's been taking his dexamethasone and is tolerating it well.  Completed chemo-counseling. We discussed BM Bx results.  On review of systems, pt reports difficulty swallowing since 3 weeks, dry mouth, lower back pain, increased appetite, and denies abdominal pain, teeth pain, fever, chills, night sweats and any other accompanying symptoms.  Medical History: Past Medical History:  Diagnosis Date  . Hypertension   . Kidney stones   . Pulmonary hypertension ([place])     Surgical History: Past Surgical History:    Procedure Laterality Date  . CARDIAC CATHETERIZATION N/A 01/18/2016   Procedure: Right Heart Cath;  Surgeon: Jolaine Artist, MD;  Location: [place] [facility];  Service: Cardiovascular;  Laterality: N/A;  . CARDIAC CATHETERIZATION N/A 06/24/2016   Procedure: Right Heart Cath;  Surgeon: Jolaine Artist, MD;  Location: [place] [facility];  Service: Cardiovascular;  Laterality: N/A;  . KIDNEY SURGERY    . VIDEO ASSISTED THORACOSCOPY (VATS)/DECORTICATION Left 2003  . VIDEO ASSISTED THORACOSCOPY (VATS)/DECORTICATION Left 10/02/2015   Procedure: VIDEO ASSISTED THORACOSCOPY (VATS)/DECORTICATION and drainage of chronic empyena;  Surgeon: [name], MD;  Location: [place];  Service: Thoracic;  Laterality: Left;  Marland Kitchen VIDEO BRONCHOSCOPY N/A 10/02/2015   Procedure: VIDEO BRONCHOSCOPY;  Surgeon: [name], MD;  Location: [facility] OR;  Service: Thoracic;  Laterality: N/A;    Family History: Family History  Problem Relation Age of Onset  . Hypertension Other     Social History: Social History   Socioeconomic History  . Marital status: Single    Spouse name: Not on file  . Number of children: Not on file  . Years of education: Not on file  . Highest education level: Not on file  Social Needs  . Financial resource strain: Not on file  . Food insecurity - worry: Not on file  . Food insecurity - inability: Not on file  . Transportation needs - medical: Not on file  . Transportation needs - non-medical: Not on file  Occupational History  . Not on file  Tobacco Use  . Smoking status: Never Smoker  . Smokeless tobacco: Never Used  Substance and Sexual Activity  . Alcohol use: Yes  . Drug use: No  . Sexual activity: Not on file  Other Topics Concern  .  Not on file  Social History Narrative  . Not on file    Allergies: No Known Allergies  Medications:  Current Outpatient Medications  Medication Sig Dispense Refill  . albuterol (PROVENTIL HFA;VENTOLIN HFA) 108 (90 Base)  MCG/ACT inhaler Inhale 2 puffs into the lungs every 6 (six) hours as needed for wheezing or shortness of breath. 1 Inhaler 2  . aspirin EC 325 MG EC tablet Take 1 tablet (325 mg total) by mouth daily. 30 tablet 3  . dexamethasone (DECADRON) 4 MG tablet Take 5 tablets (20 mg total) by mouth once a week. 20 tablet 0  . OXYGEN 2lpm BEDTIME ONLY    . tadalafil, PAH, (ADCIRCA) 20 MG tablet Take 2 tablets (40 mg total) by mouth daily. 60 tablet 11  . traMADol (ULTRAM) 50 MG tablet Take 1 tablet (50 mg total) by mouth every 6 (six) hours as needed. 30 tablet 0  . umeclidinium-vilanterol (ANORO ELLIPTA) 62.5-25 MCG/INH AEPB Inhale 1 puff into the lungs daily. 60 each 5  . umeclidinium-vilanterol (ANORO ELLIPTA) 62.5-25 MCG/INH AEPB Inhale 1 puff daily into the lungs. 1 each 5   No current facility-administered medications for this visit.     Review of Systems: Review of Systems  Constitutional: Negative for fever.  HENT:   Negative for lump/mass.   Respiratory: Negative for shortness of breath.   Cardiovascular: Positive for chest pain.  Gastrointestinal: Negative for diarrhea, nausea and vomiting.  Genitourinary: Negative for bladder incontinence and difficulty urinating.   Musculoskeletal: Positive for arthralgias and back pain.  All other systems reviewed and are negative.  PHYSICAL EXAMINATION There were no vitals taken for this visit.  ECOG PERFORMANCE STATUS: 1 - Symptomatic but completely ambulatory  Physical Exam  Constitutional: He is oriented to person, place, and time and well-developed, well-nourished, and in no distress. No distress.  HENT:  Head: Normocephalic and atraumatic.  Mouth/Throat: Oropharynx is clear and moist. No oropharyngeal exudate.  Eyes: Conjunctivae are normal. Pupils are equal, round, and reactive to light. No scleral icterus.  Neck: No thyromegaly present.  Cardiovascular: Normal rate, regular rhythm and normal heart sounds.  No murmur  heard. Pulmonary/Chest:  decreased breath sounds diffuse over the left lung, right lung with basilar crackles. No expiratory wheezing.  Abdominal: Soft. Bowel sounds are normal. He exhibits no distension. There is no tenderness. There is no rebound.  Musculoskeletal: He exhibits tenderness. He exhibits no edema.  Mild TTP in the lower back around the L3-L4 region.   Lymphadenopathy:    He has no cervical adenopathy.  Neurological: He is alert and oriented to person, place, and time. He has normal reflexes. No cranial nerve deficit.  Skin: Skin is warm and dry. No rash noted. He is not diaphoretic. No erythema.     LABORATORY DATA:  .Marland Kitchen CBC Latest Ref Rng & Units 11/07/2017 11/03/2017 09/29/2017  WBC 4.0 - 10.3 10e3/uL 7.4 5.6 5.5  Hemoglobin 13.0 - 17.1 g/dL 13.1 13.3 12.2(L)  Hematocrit 38.4 - 49.9 % 39.7 38.9(L) 36.7(L)  Platelets 140 - 400 10e3/uL 320 297 286     . CMP Latest Ref Rng & Units 11/07/2017 09/29/2017 09/29/2017  Glucose 70 - 140 mg/dl 90 78 -  BUN 7.0 - 26.0 mg/dL 12.0 7.0 -  Creatinine 0.7 - 1.3 mg/dL 0.9 0.9 -  Sodium 136 - 145 mEq/L 138 135(L) -  Potassium 3.5 - 5.1 mEq/L 4.1 4.3 -  Chloride 101 - 111 mmol/L - - -  CO2 22 - 29 mEq/L 30(H)  26 -  Calcium 8.4 - 10.4 mg/dL 8.5 8.9 -  Total Protein 6.4 - 8.3 g/dL 9.2(H) 10.0(H) 9.2(H)  Total Bilirubin 0.20 - 1.20 mg/dL 0.43 0.27 -  Alkaline Phos 40 - 150 U/L 92 75 -  AST 5 - 34 U/L 24 28 -  ALT 0 - 55 U/L 11 12 -       . Lab Results  Component Value Date   LDH 169 09/29/2017    RADIOLOGY  .Nm Pet Image Initial (pi) Whole Body  Result Date: 10/21/2017 CLINICAL DATA:  Subsequent treatment strategy for multiple myeloma. EXAM: NUCLEAR MEDICINE PET WHOLE BODY TECHNIQUE: 5.79 mCi F-18 FDG was injected intravenously. Full-ring PET imaging was performed from the vertex to the feet after the radiotracer. CT data was obtained and used for attenuation correction and anatomic localization. FASTING BLOOD GLUCOSE:   Value: 94 mg/dl COMPARISON:  CTA chest dated 11/06/2016. FINDINGS: HEAD/NECK No hypermetabolic cervical lymphadenopathy. CHEST Chronic thick-walled left pleural collection with complex fluid and gas, corresponding to the patient's known empyema. Associated mild hypermetabolism. Mild bronchiectasis in the bilateral lower lobes. No suspicious pulmonary nodules. The heart is normal in size.  No pericardial effusion. ABDOMEN/PELVIS No abnormal hypermetabolic activity within the liver, pancreas, adrenal glands, or spleen. No hypermetabolic lymph nodes in the abdomen or pelvis. SKELETON 15 mm lytic lesion in the left L4 vertebral body (series 4/image 137), max SUV 4.0. Mild metabolism in the L5 vertebral body, max SUV 3.2, without CT correlate. Mild asymmetric hypermetabolism in the left T12 vertebral body, max SUV 2.7, without CT correlate. EXTREMITIES No abnormal hypermetabolic activity in the lower extremities. IMPRESSION: 15 mm lytic lesion with hypermetabolism in the left L4 vertebral body, suspicious for multiple myeloma. Chronic empyema with gas/fluid in the left hemithorax. Electronically Signed   By: Julian Hy M.D.   On: 10/21/2017 11:54   Ct Biopsy  Result Date: 11/03/2017 INDICATION: MGUS. Concern for multiple myeloma. Please perform CT-guided bone marrow biopsy for tissue diagnostic purposes. EXAM: CT-GUIDED BONE MARROW BIOPSY AND ASPIRATION MEDICATIONS: None ANESTHESIA/SEDATION: Fentanyl 100 mcg IV; Versed 2 mg IV Sedation Time: 12 minutes; The patient was continuously monitored during the procedure by the interventional radiology nurse under my direct supervision. COMPLICATIONS: None immediate. PROCEDURE: Informed consent was obtained from the patient following an explanation of the procedure, risks, benefits and alternatives. The patient understands, agrees and consents for the procedure. All questions were addressed. A time out was performed prior to the initiation of the procedure. The  patient was positioned prone and non-contrast localization CT was performed of the pelvis to demonstrate the iliac marrow spaces. The operative site was prepped and draped in the usual sterile fashion. Under sterile conditions and local anesthesia, a 22 gauge spinal needle was utilized for procedural planning. Next, an 11 gauge coaxial bone biopsy needle was advanced into the left iliac marrow space. Needle position was confirmed with CT imaging. Initially, bone marrow aspiration was performed. Next, a bone marrow biopsy was obtained with the 11 gauge outer bone marrow device. Samples were prepared with the cytotechnologist and deemed adequate. The needle was removed intact. Hemostasis was obtained with compression and a dressing was placed. The patient tolerated the procedure well without immediate post procedural complication. IMPRESSION: Successful CT guided left iliac bone marrow aspiration and core biopsy. Electronically Signed   By: Sandi Mariscal M.D.   On: 11/03/2017 10:08   Ct Bone Marrow Biopsy & Aspiration  Result Date: 11/03/2017 INDICATION: MGUS. Concern for multiple myeloma. Please  perform CT-guided bone marrow biopsy for tissue diagnostic purposes. EXAM: CT-GUIDED BONE MARROW BIOPSY AND ASPIRATION MEDICATIONS: None ANESTHESIA/SEDATION: Fentanyl 100 mcg IV; Versed 2 mg IV Sedation Time: 12 minutes; The patient was continuously monitored during the procedure by the interventional radiology nurse under my direct supervision. COMPLICATIONS: None immediate. PROCEDURE: Informed consent was obtained from the patient following an explanation of the procedure, risks, benefits and alternatives. The patient understands, agrees and consents for the procedure. All questions were addressed. A time out was performed prior to the initiation of the procedure. The patient was positioned prone and non-contrast localization CT was performed of the pelvis to demonstrate the iliac marrow spaces. The operative site was  prepped and draped in the usual sterile fashion. Under sterile conditions and local anesthesia, a 22 gauge spinal needle was utilized for procedural planning. Next, an 11 gauge coaxial bone biopsy needle was advanced into the left iliac marrow space. Needle position was confirmed with CT imaging. Initially, bone marrow aspiration was performed. Next, a bone marrow biopsy was obtained with the 11 gauge outer bone marrow device. Samples were prepared with the cytotechnologist and deemed adequate. The needle was removed intact. Hemostasis was obtained with compression and a dressing was placed. The patient tolerated the procedure well without immediate post procedural complication. IMPRESSION: Successful CT guided left iliac bone marrow aspiration and core biopsy. Electronically Signed   By: Sandi Mariscal M.D.   On: 11/03/2017 10:08     ASSESSMENT AND PLAN  The patient is a 57 y.o. male who presents into the clinic today to discuss the following:   1) IgG kappa Multiple Myeloma  M spike of 3.4g/dl No signiifcant anemia, renal failure or hypercalcemia at this time PET/CT shows - 15 mm lytic lesion with hypermetabolism in the left L4 vertebral body, suspicious for multiple myeloma.  2) Lower back pain - ? Related to L4 lesion.  PLAN -I discussed with the patient and his girlfriend via Physiological scientist the details of his current condition at great length.  -M Bx results were discussed in details. -on Dexamethasone (5m weekly) as well while we wait these results to help with back pain -tramadol refilled for prn use for back pain. Completed chemo-counseling. -starting Vd, consider additioning 3rd drug from cycle 2 if tolerated. -on acyclovir for shingle proplhyaxis -zofran prn for nausea.  -Schedule IV Zometa q4weeks starting 11/18/2016 Start treatment as per orders from 11/10/2016 Weekly labs RTC with Dr PLebron Connerson C1D8 for toxicity check  RTC with Dr. PLebron Connerson D8 of chemotherapy   The patient  knows to call the clinic with any problems, questions or concerns.  TT spent in direct contact 266ms , counseling and co-ordinating cares for new diagnosis of myeloma 4544m   Sharaya Boruff MD MS AAHIVMS SCHLake Worth Surgical CenterHDenville Surgery Centermatology/Oncology Physician ConOresteshis document serves as a record of services personally performed by GauSullivan LoneD. It was created on his behalf by LanAlean Rinne trained medical scribe. The creation of this record is based on the scribe's personal observations and the provider's statements to them.   .I have reviewed the above documentation for accuracy and completeness, and I agree with the above. .GaBrunetta Genera MS

## 2017-11-07 NOTE — Patient Instructions (Signed)
Thank you for choosing [facility] to provide your oncology and hematology care.  To afford each patient quality time with our providers, please arrive 30 minutes before your scheduled appointment time.  If you arrive late for your appointment, you may be asked to reschedule.  We strive to give you quality time with our providers, and arriving late affects you and other patients whose appointments are after yours.  If you are a no show for multiple scheduled visits, you may be dismissed from the clinic at the providers discretion.   Again, thank you for choosing [facility], our hope is that these requests will decrease the amount of time that you wait before being seen by our physicians.  ______________________________________________________________________ Should you have questions after your visit to the [facility], please contact our office at [phone] between the hours of 8:30 and 4:30 p.m.    Voicemails left after 4:30p.m will not be returned until the following business day.   For prescription refill requests, please have your pharmacy contact us directly.  Please also try to allow 48 hours for prescription requests.   Please contact the scheduling department for questions regarding scheduling.  For scheduling of procedures such as PET scans, CT scans, MRI, Ultrasound, etc please contact central scheduling at [phone].   Resources For Cancer Patients and Caregivers:  American Cancer Society:  800-227-2345  Can help patients locate various types of support and financial assistance Cancer Care: 1-800-813-HOPE (4673) Provides financial assistance, online support groups, medication/co-pay assistance.   [place] DSS:  336-641-3447 Where to apply for food stamps, Medicaid, and utility assistance Medicare Rights Center: 800-333-4114 Helps people with Medicare understand their rights and benefits, navigate the Medicare system, and secure the  quality healthcare they deserve SCAT: 336-333-6589 [place] [employer]'s shared-ride transportation service for eligible riders who have a disability that prevents them from riding the fixed route bus.   For additional information on assistance programs please contact our social worker:   Grier Hock/Abigail Elmore:  [phone] 

## 2017-11-07 NOTE — Progress Notes (Signed)
Met w/ pt's caregiver regarding financial assistance.  Pt is uninsured so I gave her a financial application to apply for a discount thru the hospital and also another Medicaid application to apply since he was denied before receiving disability.  I also informed her of the [place], went over what it covers and gave her an expense sheet.  Pt would like to apply so he will bring his proof of income on 11/10/17.  Pt has my card for questions or concerns he may have in the future.

## 2017-11-08 DIAGNOSIS — Z7189 Other specified counseling: Secondary | ICD-10-CM | POA: Insufficient documentation

## 2017-11-08 MED ORDER — ONDANSETRON HCL 8 MG PO TABS: 8.0000 mg | ORAL_TABLET | Freq: Two times a day (BID) | ORAL | 1 refills | Status: DC | PRN

## 2017-11-08 MED ORDER — ACYCLOVIR 400 MG PO TABS: 400.0000 mg | ORAL_TABLET | Freq: Two times a day (BID) | ORAL | 3 refills | Status: DC

## 2017-11-08 NOTE — Progress Notes (Signed)
[facility] Follow-up Visit:  Assessment: MGUS (monoclonal gammopathy of unknown significance) 57 y.o.  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  Repeat level demonstrates more convincing evidence of presence of monoclonal gammopathy with M spike of 3.4 g/dL.  With that in mind, we will proceed with additional assessment for possible presence of multiple myeloma.  Plan: --PET/CT --Bone marrow biopsy by interventional radiology -- Return to clinic 1-2 weeks following bone marrow biopsy to discuss results and plan further assessment/management strategy.  Voice recognition software was used and creation of this note. Despite my best effort at editing the text, some misspelling/errors may have occurred.  Orders Placed This Encounter  Procedures  . NM PET Image Initial (PI) Whole Body    Standing Status:   Future    Number of Occurrences:   1    Standing Expiration Date:   10/08/2018    Order Specific Question:   If indicated for the ordered procedure, I authorize the administration of a radiopharmaceutical per Radiology protocol    Answer:   Yes    Order Specific Question:   Preferred imaging location?    Answer:   [facility]    Order Specific Question:   Radiology Contrast Protocol - do NOT remove file path    Answer:   [email or web address]    Order Specific Question:   Reason for Exam additional comments    Answer:   MGUS, please eval skeletal structures for lytic lesions and possible multiple myeloma  . CT Biopsy    Standing Status:   Future    Number of Occurrences:   1    Standing Expiration Date:   10/08/2018    Order Specific Question:   Lab orders requested (DO NOT place separate lab orders, these will be automatically ordered during procedure specimen collection):    Answer:   Cytology - Non Pap    Comments:   Cytogenetics, MM FISH    Order Specific Question:    Lab orders requested (DO NOT place separate lab orders, these will be automatically ordered during procedure specimen collection):    Answer:   Surgical Pathology    Order Specific Question:   Lab orders requested (DO NOT place separate lab orders, these will be automatically ordered during procedure specimen collection):    Answer:   Other    Order Specific Question:   Reason for Exam (SYMPTOM  OR DIAGNOSIS REQUIRED)    Answer:   MGUS, evaluating for possible multiple myealoma    Order Specific Question:   Preferred imaging location?    Answer:   [facility]    Order Specific Question:   Radiology Contrast Protocol - do NOT remove file path    Answer:   [email or web address]  . CT BONE MARROW BIOPSY & ASPIRATION    Standing Status:   Future    Number of Occurrences:   1    Standing Expiration Date:   01/07/2019    Order Specific Question:   Reason for Exam (SYMPTOM  OR DIAGNOSIS REQUIRED)    Answer:   MGUS, please eval for MM    Order Specific Question:   Preferred imaging location?    Answer:   [facility]    Order Specific Question:   Radiology Contrast Protocol - do NOT remove file path    Answer:   [email or web address]    Cancer Staging No matching staging information was found for the patient.  All questions were answered.  . The patient knows to call the clinic with any problems, questions or concerns.  This note was electronically signed.    History of Presenting Illness [name] [name] is a 57 y.o. male followed in the [facility] for monoclonal Gammopathy of unknown significance. Patient's past medical history is significant for recurrent left empyema and pulmonary hypertension. Patient has been treated with VATS/decortication of the left lung.Also has history or heterozygous alpha-1 Anti-trypsin deficiency, MZ phenotype, COPD.  Patient currently reports feeling well with no active complaints of fever,  chills, night sweats. Denies any recent weight loss. Denies a change in activity tolerance or energy level. No significant changes in appetite. Denies abdominal pain, constipation, or diarrhea. No dysuria or hematuria. Denies any urological deficits.  Oncological/hematological History: --ECHO, 07/01/17: left ventricular ejection fraction 60 to 65%, normal Left ventricular wall thickness, impaired relaxation. --Labs, 08/26/17: SPEP -- restricted band in gamma globulins, suggestive of possible medical protein. --Labs, 09/29/17: tProt 10.0, Alb 2.7, Ca 8.9, Cr 0.9, AP 75, LDH 169; SPEP -- M-Spike 3.4g/dL, SIFE -- IgG kappa; IgG 4555, IgA 142, IgM 32; kappa 74.2, lambda 15.8, KLR 4.70; WBC 5.2, Hgb 12.2, Plt 286   No history exists.    Medical History: Past Medical History:  Diagnosis Date  . Hypertension   . Kidney stones   . Pulmonary hypertension ([place])     Surgical History: Past Surgical History:  Procedure Laterality Date  . CARDIAC CATHETERIZATION N/A 01/18/2016   Procedure: Right Heart Cath;  Surgeon: Jolaine Artist, MD;  Location: [facility];  Service: Cardiovascular;  Laterality: N/A;  . CARDIAC CATHETERIZATION N/A 06/24/2016   Procedure: Right Heart Cath;  Surgeon: Jolaine Artist, MD;  Location: [facility];  Service: Cardiovascular;  Laterality: N/A;  . KIDNEY SURGERY    . VIDEO ASSISTED THORACOSCOPY (VATS)/DECORTICATION Left 2003  . VIDEO ASSISTED THORACOSCOPY (VATS)/DECORTICATION Left 10/02/2015   Procedure: VIDEO ASSISTED THORACOSCOPY (VATS)/DECORTICATION and drainage of chronic empyena;  Surgeon: [name], MD;  Location: [facility];  Service: Thoracic;  Laterality: Left;  Marland Kitchen VIDEO BRONCHOSCOPY N/A 10/02/2015   Procedure: VIDEO BRONCHOSCOPY;  Surgeon: [name], MD;  Location: [facility] OR;  Service: Thoracic;  Laterality: N/A;    Family History: Family History  Problem Relation Age of Onset  . Hypertension Other     Social History: Social  History   Socioeconomic History  . Marital status: Single    Spouse name: Not on file  . Number of children: Not on file  . Years of education: Not on file  . Highest education level: Not on file  Social Needs  . Financial resource strain: Not on file  . Food insecurity - worry: Not on file  . Food insecurity - inability: Not on file  . Transportation needs - medical: Not on file  . Transportation needs - non-medical: Not on file  Occupational History  . Not on file  Tobacco Use  . Smoking status: Never Smoker  . Smokeless tobacco: Never Used  Substance and Sexual Activity  . Alcohol use: Yes  . Drug use: No  . Sexual activity: Not on file  Other Topics Concern  . Not on file  Social History Narrative  . Not on file    Allergies: No Known Allergies  Medications:  Current Outpatient Medications  Medication Sig Dispense Refill  . albuterol (PROVENTIL HFA;VENTOLIN HFA) 108 (90 Base) MCG/ACT inhaler Inhale 2 puffs into the lungs every 6 (six)  hours as needed for wheezing or shortness of breath. 1 Inhaler 2  . aspirin EC 325 MG EC tablet Take 1 tablet (325 mg total) by mouth daily. 30 tablet 3  . OXYGEN 2lpm BEDTIME ONLY    . tadalafil, PAH, (ADCIRCA) 20 MG tablet Take 2 tablets (40 mg total) by mouth daily. 60 tablet 11  . umeclidinium-vilanterol (ANORO ELLIPTA) 62.5-25 MCG/INH AEPB Inhale 1 puff into the lungs daily. 60 each 5  . dexamethasone (DECADRON) 4 MG tablet Take 5 tablets (20 mg total) by mouth once a week. 20 tablet 0  . traMADol (ULTRAM) 50 MG tablet Take 1-2 tablets (50-100 mg total) by mouth every 6 (six) hours as needed. 60 tablet 0   No current facility-administered medications for this visit.     Review of Systems: Review of Systems  All other systems reviewed and are negative.    PHYSICAL EXAMINATION Blood pressure (!) 135/100, pulse 87, temperature 97.7 F (36.5 C), temperature source Oral, resp. rate 18, height 5' (1.524 m), weight 96 lb 12.8 oz  (43.9 kg), SpO2 95 %.  ECOG PERFORMANCE STATUS: 1 - Symptomatic but completely ambulatory  Physical Exam  Constitutional: He is oriented to person, place, and time and well-developed, well-nourished, and in no distress. No distress.  HENT:  Head: Normocephalic and atraumatic.  Mouth/Throat: Oropharynx is clear and moist. No oropharyngeal exudate.  Eyes: Conjunctivae are normal. Pupils are equal, round, and reactive to light. No scleral icterus.  Neck: No thyromegaly present.  Cardiovascular: Normal rate, regular rhythm and normal heart sounds.  No murmur heard. Pulmonary/Chest:  decreased breath sounds diffuse over the left lung, right lung with basilar crackles. No expiratory wheezing.  Abdominal: Soft. Bowel sounds are normal. He exhibits no distension. There is no tenderness. There is no rebound.  Musculoskeletal: He exhibits no edema.  Lymphadenopathy:    He has no cervical adenopathy.  Neurological: He is alert and oriented to person, place, and time. He has normal reflexes. No cranial nerve deficit.  Skin: Skin is warm and dry. No rash noted. He is not diaphoretic. No erythema.     LABORATORY DATA: I have personally reviewed the data as listed: No visits with results within 1 Week(s) from this visit.  Latest known visit with results is:  Appointment on 09/29/2017  Component Date Value Ref Range Status  . Ig Kappa Free Light Chain 09/29/2017 74.2* 3.3 - 19.4 mg/L Final  . Ig Lambda Free Light Chain 09/29/2017 15.8  5.7 - 26.3 mg/L Final  . Kappa/Lambda FluidC Ratio 09/29/2017 4.70* 0.26 - 1.65 Final  . IgG, Qn, Serum 09/29/2017 4,555* 700 - 1600 mg/dL Final  . IgA, Qn, Serum 09/29/2017 142  90 - 386 mg/dL Final  . IgM, Qn, Serum 09/29/2017 32  20 - 172 mg/dL Final  . Total Protein 09/29/2017 9.2* 6.0 - 8.5 g/dL Final  . Albumin SerPl Elph-Mcnc 09/29/2017 2.9  2.9 - 4.4 g/dL Final  . Alpha 1 09/29/2017 0.3  0.0 - 0.4 g/dL Final  . Alpha2 Glob SerPl Elph-Mcnc 09/29/2017 0.8   0.4 - 1.0 g/dL Final  . B-Globulin SerPl Elph-Mcnc 09/29/2017 1.1  0.7 - 1.3 g/dL Final  . Gamma Glob SerPl Elph-Mcnc 09/29/2017 4.0* 0.4 - 1.8 g/dL Final  . M Protein SerPl Elph-Mcnc 09/29/2017 3.4* Not Observed g/dL Final  . Globulin, Total 09/29/2017 6.3* 2.2 - 3.9 g/dL Final  . Albumin/Glob SerPl 09/29/2017 0.5* 0.7 - 1.7 Final  . IFE 1 09/29/2017 Comment   Final  Comment: Immunofixation shows IgG monoclonal protein with kappa light chain specificity.   . Please Note 09/29/2017 Comment   Final   Comment: Protein electrophoresis scan will follow via computer, mail, or courier delivery.   Marland Kitchen LDH 09/29/2017 169  125 - 245 U/L Final  . Sodium 09/29/2017 135* 136 - 145 mEq/L Final  . Potassium 09/29/2017 4.3  3.5 - 5.1 mEq/L Final  . Chloride 09/29/2017 100  98 - 109 mEq/L Final  . CO2 09/29/2017 26  22 - 29 mEq/L Final  . Glucose 09/29/2017 78  70 - 140 mg/dl Final   Glucose reference range is for nonfasting patients. Fasting glucose reference range is 70- 100.  Marland Kitchen BUN 09/29/2017 7.0  7.0 - 26.0 mg/dL Final  . Creatinine 09/29/2017 0.9  0.7 - 1.3 mg/dL Final  . Total Bilirubin 09/29/2017 0.27  0.20 - 1.20 mg/dL Final  . Alkaline Phosphatase 09/29/2017 75  40 - 150 U/L Final  . AST 09/29/2017 28  5 - 34 U/L Final  . ALT 09/29/2017 12  0 - 55 U/L Final  . Total Protein 09/29/2017 10.0* 6.4 - 8.3 g/dL Final  . Albumin 09/29/2017 2.7* 3.5 - 5.0 g/dL Final  . Calcium 09/29/2017 8.9  8.4 - 10.4 mg/dL Final  . Anion Gap 09/29/2017 9  3 - 11 mEq/L Final  . EGFR 09/29/2017 >60  >60 ml/min/1.73 m2 Final   eGFR is calculated using the CKD-EPI Creatinine Equation (2009)  . WBC 09/29/2017 5.5  4.0 - 10.3 10e3/uL Final  . NEUT# 09/29/2017 3.0  1.5 - 6.5 10e3/uL Final  . HGB 09/29/2017 12.2* 13.0 - 17.1 g/dL Final  . HCT 09/29/2017 36.7* 38.4 - 49.9 % Final  . Platelets 09/29/2017 286  140 - 400 10e3/uL Final  . MCV 09/29/2017 87.0  79.3 - 98.0 fL Final  . MCH 09/29/2017 28.9  27.2 - 33.4 pg  Final  . MCHC 09/29/2017 33.2  32.0 - 36.0 g/dL Final  . RBC 09/29/2017 4.22  4.20 - 5.82 10e6/uL Final  . RDW 09/29/2017 14.8* 11.0 - 14.6 % Final  . lymph# 09/29/2017 1.7  0.9 - 3.3 10e3/uL Final  . MONO# 09/29/2017 0.4  0.1 - 0.9 10e3/uL Final  . Eosinophils Absolute 09/29/2017 0.3  0.0 - 0.5 10e3/uL Final  . Basophils Absolute 09/29/2017 0.0  0.0 - 0.1 10e3/uL Final  . NEUT% 09/29/2017 55.3  39.0 - 75.0 % Final  . LYMPH% 09/29/2017 31.5  14.0 - 49.0 % Final  . MONO% 09/29/2017 6.8  0.0 - 14.0 % Final  . EOS% 09/29/2017 6.0  0.0 - 7.0 % Final  . BASO% 09/29/2017 0.4  0.0 - 2.0 % Final       Ardath Sax, MD

## 2017-11-08 NOTE — Assessment & Plan Note (Signed)
57 y.o.  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  Repeat level demonstrates more convincing evidence of presence of monoclonal gammopathy with M spike of 3.4 g/dL.  With that in mind, we will proceed with additional assessment for possible presence of multiple myeloma.  Plan: --PET/CT --Bone marrow biopsy by interventional radiology -- Return to clinic 1-2 weeks following bone marrow biopsy to discuss results and plan further assessment/management strategy.

## 2017-11-08 NOTE — Progress Notes (Signed)
START ON PATHWAY REGIMEN - Multiple Myeloma and Other Plasma Cell Dyscrasias     A cycle is every 21 days:     Bortezomib      Lenalidomide      Dexamethasone   **Always confirm dose/schedule in your pharmacy ordering system**  Patient Characteristics: Newly Diagnosed, Transplant Eligible, Unknown or Awaiting Test Results R-ISS Staging: Unknown Disease Classification: Newly Diagnosed Is Patient Eligible for Transplant<= Transplant Eligible Risk Status: Awaiting Test Results Intent of Therapy: Non-Curative / Palliative Intent, Discussed with Patient 

## 2017-11-10 ENCOUNTER — Other Ambulatory Visit: Payer: Self-pay

## 2017-11-10 ENCOUNTER — Encounter: Payer: Self-pay | Admitting: Hematology

## 2017-11-10 ENCOUNTER — Inpatient Hospital Stay: Payer: Self-pay | Attending: Hematology

## 2017-11-10 DIAGNOSIS — C9000 Multiple myeloma not having achieved remission: Secondary | ICD-10-CM | POA: Insufficient documentation

## 2017-11-10 DIAGNOSIS — Z7189 Other specified counseling: Secondary | ICD-10-CM

## 2017-11-10 DIAGNOSIS — Z5112 Encounter for antineoplastic immunotherapy: Secondary | ICD-10-CM | POA: Insufficient documentation

## 2017-11-10 LAB — KAPPA/LAMBDA LIGHT CHAINS
IG KAPPA FREE LIGHT CHAIN: 46.2 mg/L — AB (ref 3.3–19.4)
Ig Lambda Free Light Chain: 16 mg/L (ref 5.7–26.3)
KAPPA/LAMBDA FLC RATIO: 2.89 — AB (ref 0.26–1.65)

## 2017-11-10 LAB — MULTIPLE MYELOMA PANEL, SERUM
ALBUMIN/GLOB SERPL: 0.6 — AB (ref 0.7–1.7)
ALPHA 1: 0.3 g/dL (ref 0.0–0.4)
Albumin SerPl Elph-Mcnc: 2.8 g/dL — ABNORMAL LOW (ref 2.9–4.4)
Alpha2 Glob SerPl Elph-Mcnc: 0.8 g/dL (ref 0.4–1.0)
B-Globulin SerPl Elph-Mcnc: 1 g/dL (ref 0.7–1.3)
Gamma Glob SerPl Elph-Mcnc: 3.3 g/dL — ABNORMAL HIGH (ref 0.4–1.8)
Globulin, Total: 5.5 g/dL — ABNORMAL HIGH (ref 2.2–3.9)
IGM (IMMUNOGLOBIN M), SRM: 35 mg/dL (ref 20–172)
IgA, Qn, Serum: 134 mg/dL (ref 90–386)
IgG, Qn, Serum: 3587 mg/dL — ABNORMAL HIGH (ref 700–1600)
M Protein SerPl Elph-Mcnc: 2.5 g/dL — ABNORMAL HIGH
TOTAL PROTEIN: 8.3 g/dL (ref 6.0–8.5)

## 2017-11-10 MED ORDER — PROCHLORPERAZINE MALEATE 10 MG PO TABS
ORAL_TABLET | ORAL | Status: AC
  Filled 2017-11-10: qty 1

## 2017-11-10 MED ORDER — BORTEZOMIB CHEMO SQ INJECTION 3.5 MG (2.5MG/ML)
1.3000 mg/m2 | Freq: Once | INTRAMUSCULAR | Status: AC
  Administered 2017-11-10: 1.75 mg via SUBCUTANEOUS
  Filled 2017-11-10: qty 1.75

## 2017-11-10 MED ORDER — PROCHLORPERAZINE MALEATE 10 MG PO TABS
10.0000 mg | ORAL_TABLET | Freq: Once | ORAL | Status: AC
  Administered 2017-11-10: 10 mg via ORAL

## 2017-11-10 NOTE — Progress Notes (Signed)
Pt is approved for the $400 [facility] grant.  °

## 2017-11-10 NOTE — Patient Instructions (Signed)
[facility] Discharge Instructions for Patients Receiving Chemotherapy  Today you received the following chemotherapy agents: Bortezomib (Velcade).  To help prevent nausea and vomiting after your treatment, we encourage you to take your nausea medication as prescribed.  If you develop nausea and vomiting that is not controlled by your nausea medication, call the clinic.   BELOW ARE SYMPTOMS THAT SHOULD BE REPORTED IMMEDIATELY:  *FEVER GREATER THAN 100.5 F  *CHILLS WITH OR WITHOUT FEVER  NAUSEA AND VOMITING THAT IS NOT CONTROLLED WITH YOUR NAUSEA MEDICATION  *UNUSUAL SHORTNESS OF BREATH  *UNUSUAL BRUISING OR BLEEDING  TENDERNESS IN MOUTH AND THROAT WITH OR WITHOUT PRESENCE OF ULCERS  *URINARY PROBLEMS  *BOWEL PROBLEMS  UNUSUAL RASH Items with * indicate a potential emergency and should be followed up as soon as possible.  Feel free to call the clinic should you have any questions or concerns. The clinic phone number is [phone].  Please show the [facility] at check-in to the Emergency Department and triage nurse.  Bortezomib injection What is this medicine? BORTEZOMIB (bor TEZ oh mib) is a medicine that targets proteins in cancer cells and stops the cancer cells from growing. It is used to treat multiple myeloma and mantle-cell lymphoma. This medicine may be used for other purposes; ask your health care provider or pharmacist if you have questions. COMMON BRAND NAME(S): Velcade What should I tell my health care provider before I take this medicine? They need to know if you have any of these conditions: -diabetes -heart disease -irregular heartbeat -liver disease -on hemodialysis -low blood counts, like low white blood cells, platelets, or hemoglobin -peripheral neuropathy -taking medicine for blood pressure -an unusual or allergic reaction to bortezomib, mannitol, boron, other medicines, foods, dyes, or preservatives -pregnant or trying to get  pregnant -breast-feeding How should I use this medicine? This medicine is for injection into a vein or for injection under the skin. It is given by a health care professional in a hospital or clinic setting. Talk to your pediatrician regarding the use of this medicine in children. Special care may be needed. Overdosage: If you think you have taken too much of this medicine contact a poison control center or emergency room at once. NOTE: This medicine is only for you. Do not share this medicine with others. What if I miss a dose? It is important not to miss your dose. Call your doctor or health care professional if you are unable to keep an appointment. What may interact with this medicine? This medicine may interact with the following medications: -ketoconazole -rifampin -ritonavir -St. John's Wort This list may not describe all possible interactions. Give your health care provider a list of all the medicines, herbs, non-prescription drugs, or dietary supplements you use. Also tell them if you smoke, drink alcohol, or use illegal drugs. Some items may interact with your medicine. What should I watch for while using this medicine? You may get drowsy or dizzy. Do not drive, use machinery, or do anything that needs mental alertness until you know how this medicine affects you. Do not stand or sit up quickly, especially if you are an older patient. This reduces the risk of dizzy or fainting spells. In some cases, you may be given additional medicines to help with side effects. Follow all directions for their use. Call your doctor or health care professional for advice if you get a fever, chills or sore throat, or other symptoms of a cold or flu. Do not treat  treat yourself. This drug decreases your body's ability to fight infections. Try to avoid being around people who are sick. This medicine may increase your risk to bruise or bleed. Call your doctor or health care professional if you notice any unusual  bleeding. You may need blood work done while you are taking this medicine. In some patients, this medicine may cause a serious brain infection that may cause death. If you have any problems seeing, thinking, speaking, walking, or standing, tell your doctor right away. If you cannot reach your doctor, urgently seek other source of medical care. Check with your doctor or health care professional if you get an attack of severe diarrhea, nausea and vomiting, or if you sweat a lot. The loss of too much body fluid can make it dangerous for you to take this medicine. Do not become pregnant while taking this medicine or for at least 2 months after stopping it. Women should inform their doctor if they wish to become pregnant or think they might be pregnant. Men should not father a child while taking this medicine and for at least 2 months after stopping it. There is a potential for serious side effects to an unborn child. Talk to your health care professional or pharmacist for more information. Do not breast-feed an infant while taking this medicine or for 2 months after stopping it. This medicine may interfere with the ability to have a child. You should talk with your doctor or health care professional if you are concerned about your fertility. What side effects may I notice from receiving this medicine? Side effects that you should report to your doctor or health care professional as soon as possible: -allergic reactions like skin rash, itching or hives, swelling of the face, lips, or tongue -breathing problems -changes in hearing -changes in vision -fast, irregular heartbeat -feeling faint or lightheaded, falls -pain, tingling, numbness in the hands or feet -right upper belly pain -seizures -swelling of the ankles, feet, hands -unusual bleeding or bruising -unusually weak or tired -vomiting -yellowing of the eyes or skin Side effects that usually do not require medical attention (report to your  doctor or health care professional if they continue or are bothersome): -changes in emotions or moods -constipation -diarrhea -loss of appetite -headache -irritation at site where injected -nausea This list may not describe all possible side effects. Call your doctor for medical advice about side effects. You may report side effects to FDA at 1-800-FDA-1088. Where should I keep my medicine? This drug is given in a hospital or clinic and will not be stored at home. NOTE: This sheet is a summary. It may not cover all possible information. If you have questions about this medicine, talk to your doctor, pharmacist, or health care provider.  2018 Elsevier/Gold Standard (2016-09-19 15:53:51)   

## 2017-11-12 ENCOUNTER — Telehealth: Payer: Self-pay | Admitting: Hematology and Oncology

## 2017-11-12 ENCOUNTER — Other Ambulatory Visit: Payer: Self-pay | Admitting: *Deleted

## 2017-11-12 MED ORDER — DEXAMETHASONE 4 MG PO TABS: 20.0000 mg | ORAL_TABLET | ORAL | 0 refills | Status: DC

## 2017-11-12 NOTE — Telephone Encounter (Signed)
Scheduled appt per 1/4 los - Patient is aware and will pick up an updated schedule.

## 2017-11-13 ENCOUNTER — Inpatient Hospital Stay: Payer: Self-pay

## 2017-11-13 VITALS — BP 115/73 | HR 89 | Temp 98.9°F | Resp 17

## 2017-11-13 DIAGNOSIS — C9000 Multiple myeloma not having achieved remission: Secondary | ICD-10-CM

## 2017-11-13 DIAGNOSIS — Z7189 Other specified counseling: Secondary | ICD-10-CM

## 2017-11-13 MED ORDER — PROCHLORPERAZINE MALEATE 10 MG PO TABS
10.0000 mg | ORAL_TABLET | Freq: Once | ORAL | Status: AC
  Administered 2017-11-13: 10 mg via ORAL

## 2017-11-13 MED ORDER — BORTEZOMIB CHEMO SQ INJECTION 3.5 MG (2.5MG/ML)
1.3000 mg/m2 | Freq: Once | INTRAMUSCULAR | Status: AC
  Administered 2017-11-13: 1.75 mg via SUBCUTANEOUS
  Filled 2017-11-13: qty 1.75

## 2017-11-13 MED ORDER — PROCHLORPERAZINE MALEATE 10 MG PO TABS
ORAL_TABLET | ORAL | Status: AC
  Filled 2017-11-13: qty 1

## 2017-11-13 NOTE — Patient Instructions (Signed)
[facility] Discharge Instructions for Patients Receiving Chemotherapy  Today you received the following chemotherapy agents: Bortezomib (Velcade)  To help prevent nausea and vomiting after your treatment, we encourage you to take your nausea medication  as prescribed.    If you develop nausea and vomiting that is not controlled by your nausea medication, call the clinic.   BELOW ARE SYMPTOMS THAT SHOULD BE REPORTED IMMEDIATELY:  *FEVER GREATER THAN 100.5 F  *CHILLS WITH OR WITHOUT FEVER  NAUSEA AND VOMITING THAT IS NOT CONTROLLED WITH YOUR NAUSEA MEDICATION  *UNUSUAL SHORTNESS OF BREATH  *UNUSUAL BRUISING OR BLEEDING  TENDERNESS IN MOUTH AND THROAT WITH OR WITHOUT PRESENCE OF ULCERS  *URINARY PROBLEMS  *BOWEL PROBLEMS  UNUSUAL RASH Items with * indicate a potential emergency and should be followed up as soon as possible.  Feel free to call the clinic should you have any questions or concerns. The clinic phone number is [phone].  Please show the CHEMO ALERT CARD at check-in to the Emergency Department and triage nurse.   

## 2017-11-14 ENCOUNTER — Encounter: Payer: Self-pay | Admitting: Hematology

## 2017-11-14 ENCOUNTER — Other Ambulatory Visit: Payer: Self-pay

## 2017-11-14 ENCOUNTER — Other Ambulatory Visit: Payer: Self-pay | Admitting: Hematology

## 2017-11-14 DIAGNOSIS — D472 Monoclonal gammopathy: Secondary | ICD-10-CM

## 2017-11-14 MED ORDER — DEXAMETHASONE 4 MG PO TABS: 20.0000 mg | ORAL_TABLET | ORAL | 3 refills | Status: DC

## 2017-11-14 MED FILL — DEXAMETHASONE 4 MG TABLET: 4 | 28 days supply | Qty: 20 | Fill #0

## 2017-11-14 NOTE — Progress Notes (Signed)
Pt submitted financial application along with the required docs needed for processing.  I sent them thru inter office mail to Halina Andreas attention.

## 2017-11-14 NOTE — Progress Notes (Signed)
Dexamethasone prescription resent to [facility] as per family request .Brunetta Genera MD MS

## 2017-11-17 ENCOUNTER — Inpatient Hospital Stay: Payer: Self-pay

## 2017-11-17 VITALS — BP 109/78 | HR 75 | Temp 98.1°F | Resp 18 | Wt 93.8 lb

## 2017-11-17 DIAGNOSIS — C9000 Multiple myeloma not having achieved remission: Secondary | ICD-10-CM

## 2017-11-17 DIAGNOSIS — D472 Monoclonal gammopathy: Secondary | ICD-10-CM

## 2017-11-17 DIAGNOSIS — Z7189 Other specified counseling: Secondary | ICD-10-CM

## 2017-11-17 LAB — CMP (CANCER CENTER ONLY)
ALT: 14 U/L (ref 0–55)
AST: 27 U/L (ref 5–34)
Albumin: 2.7 g/dL — ABNORMAL LOW (ref 3.5–5.0)
Alkaline Phosphatase: 103 U/L (ref 40–150)
Anion gap: 9 (ref 3–11)
BUN: 10 mg/dL (ref 7–26)
CHLORIDE: 95 mmol/L — AB (ref 98–109)
CO2: 27 mmol/L (ref 22–29)
Calcium: 8.9 mg/dL (ref 8.4–10.4)
Creatinine: 0.93 mg/dL (ref 0.70–1.30)
GFR, Est AFR Am: >60 mL/min (ref >60)
GFR, Estimated: >60 mL/min (ref >60)
Glucose, Bld: 135 mg/dL (ref 70–140)
Potassium: 4.4 mmol/L (ref 3.5–5.1)
SODIUM: 131 mmol/L — AB (ref 136–145)
Total Bilirubin: <0.2 mg/dL — ABNORMAL LOW (ref 0.2–1.2)
Total Protein: 9.3 g/dL — ABNORMAL HIGH (ref 6.4–8.3)

## 2017-11-17 LAB — CBC WITH DIFFERENTIAL (CANCER CENTER ONLY)
Basophils Absolute: 0 10*3/uL (ref 0.0–0.1)
Basophils Relative: 0 %
Eosinophils Absolute: 0 10*3/uL (ref 0.0–0.5)
Eosinophils Relative: 0 %
HEMATOCRIT: 42.4 % (ref 38.4–49.9)
HEMOGLOBIN: 13.8 g/dL (ref 13.0–17.1)
LYMPHS ABS: 0.5 10*3/uL — AB (ref 0.9–3.3)
LYMPHS PCT: 6 %
MCH: 28.7 pg (ref 27.2–33.4)
MCHC: 32.5 g/dL (ref 32.0–36.0)
MCV: 88.5 fL (ref 79.3–98.0)
MONOS PCT: 1 %
Monocytes Absolute: 0.1 10*3/uL (ref 0.1–0.9)
NEUTROS ABS: 7.1 10*3/uL — AB (ref 1.5–6.5)
NEUTROS PCT: 93 %
Platelet Count: 305 10*3/uL (ref 140–400)
RBC: 4.79 MIL/uL (ref 4.20–5.82)
RDW: 14.9 % (ref 11.0–15.6)
WBC: 7.7 10*3/uL (ref 4.0–10.3)

## 2017-11-17 MED ORDER — BORTEZOMIB CHEMO SQ INJECTION 3.5 MG (2.5MG/ML)
1.3000 mg/m2 | Freq: Once | INTRAMUSCULAR | Status: AC
  Administered 2017-11-17: 1.75 mg via SUBCUTANEOUS
  Filled 2017-11-17: qty 1.75

## 2017-11-17 MED ORDER — SODIUM CHLORIDE 0.9 % IV SOLN
Freq: Once | INTRAVENOUS | Status: AC
  Administered 2017-11-17: 16:00:00 via INTRAVENOUS

## 2017-11-17 MED ORDER — PROCHLORPERAZINE MALEATE 10 MG PO TABS
10.0000 mg | ORAL_TABLET | Freq: Once | ORAL | Status: AC
  Administered 2017-11-17: 10 mg via ORAL

## 2017-11-17 MED ORDER — ZOLEDRONIC ACID 4 MG/5ML IV CONC
3.5000 mg | Freq: Once | INTRAVENOUS | Status: AC
  Administered 2017-11-17: 3.5 mg via INTRAVENOUS
  Filled 2017-11-17: qty 4.38

## 2017-11-17 MED ORDER — PROCHLORPERAZINE MALEATE 10 MG PO TABS
ORAL_TABLET | ORAL | Status: AC
  Filled 2017-11-17: qty 1

## 2017-11-17 NOTE — Patient Instructions (Addendum)
[facility] Discharge Instructions for Patients Receiving Chemotherapy  Today you received the following chemotherapy agents Velcade  To help prevent nausea and vomiting after your treatment, we encourage you to take your nausea medication as directed If you develop nausea and vomiting that is not controlled by your nausea medication, call the clinic.   BELOW ARE SYMPTOMS THAT SHOULD BE REPORTED IMMEDIATELY:  *FEVER GREATER THAN 100.5 F  *CHILLS WITH OR WITHOUT FEVER  NAUSEA AND VOMITING THAT IS NOT CONTROLLED WITH YOUR NAUSEA MEDICATION  *UNUSUAL SHORTNESS OF BREATH  *UNUSUAL BRUISING OR BLEEDING  TENDERNESS IN MOUTH AND THROAT WITH OR WITHOUT PRESENCE OF ULCERS  *URINARY PROBLEMS  *BOWEL PROBLEMS  UNUSUAL RASH Items with * indicate a potential emergency and should be followed up as soon as possible.  Feel free to call the clinic should you have any questions or concerns. The clinic phone number is [phone].  Please show the CHEMO ALERT CARD at check-in to the Emergency Department and triage nurse.   Zoledronic Acid injection (Hypercalcemia, Oncology) What is this medicine? ZOLEDRONIC ACID (ZOE le dron ik AS id) lowers the amount of calcium loss from bone. It is used to treat too much calcium in your blood from cancer. It is also used to prevent complications of cancer that has spread to the bone. This medicine may be used for other purposes; ask your health care provider or pharmacist if you have questions. COMMON BRAND NAME(S): Zometa What should I tell my health care provider before I take this medicine? They need to know if you have any of these conditions: -aspirin-sensitive asthma -cancer, especially if you are receiving medicines used to treat cancer -dental disease or wear dentures -infection -kidney disease -receiving corticosteroids like dexamethasone or prednisone -an unusual or allergic reaction to zoledronic acid, other medicines,  foods, dyes, or preservatives -pregnant or trying to get pregnant -breast-feeding How should I use this medicine? This medicine is for infusion into a vein. It is given by a health care professional in a hospital or clinic setting. Talk to your pediatrician regarding the use of this medicine in children. Special care may be needed. Overdosage: If you think you have taken too much of this medicine contact a poison control center or emergency room at once. NOTE: This medicine is only for you. Do not share this medicine with others. What if I miss a dose? It is important not to miss your dose. Call your doctor or health care professional if you are unable to keep an appointment. What may interact with this medicine? -certain antibiotics given by injection -NSAIDs, medicines for pain and inflammation, like ibuprofen or naproxen -some diuretics like bumetanide, furosemide -teriparatide -thalidomide This list may not describe all possible interactions. Give your health care provider a list of all the medicines, herbs, non-prescription drugs, or dietary supplements you use. Also tell them if you smoke, drink alcohol, or use illegal drugs. Some items may interact with your medicine. What should I watch for while using this medicine? Visit your doctor or health care professional for regular checkups. It may be some time before you see the benefit from this medicine. Do not stop taking your medicine unless your doctor tells you to. Your doctor may order blood tests or other tests to see how you are doing. Women should inform their doctor if they wish to become pregnant or think they might be pregnant. There is a potential for serious side effects to an unborn child. Talk to your   health care professional or pharmacist for more information. You should make sure that you get enough calcium and vitamin D while you are taking this medicine. Discuss the foods you eat and the vitamins you take with your health  care professional. Some people who take this medicine have severe bone, joint, and/or muscle pain. This medicine may also increase your risk for jaw problems or a broken thigh bone. Tell your doctor right away if you have severe pain in your jaw, bones, joints, or muscles. Tell your doctor if you have any pain that does not go away or that gets worse. Tell your dentist and dental surgeon that you are taking this medicine. You should not have major dental surgery while on this medicine. See your dentist to have a dental exam and fix any dental problems before starting this medicine. Take good care of your teeth while on this medicine. Make sure you see your dentist for regular follow-up appointments. What side effects may I notice from receiving this medicine? Side effects that you should report to your doctor or health care professional as soon as possible: -allergic reactions like skin rash, itching or hives, swelling of the face, lips, or tongue -anxiety, confusion, or depression -breathing problems -changes in vision -eye pain -feeling faint or lightheaded, falls -jaw pain, especially after dental work -mouth sores -muscle cramps, stiffness, or weakness -redness, blistering, peeling or loosening of the skin, including inside the mouth -trouble passing urine or change in the amount of urine Side effects that usually do not require medical attention (report to your doctor or health care professional if they continue or are bothersome): -bone, joint, or muscle pain -constipation -diarrhea -fever -hair loss -irritation at site where injected -loss of appetite -nausea, vomiting -stomach upset -trouble sleeping -trouble swallowing -weak or tired This list may not describe all possible side effects. Call your doctor for medical advice about side effects. You may report side effects to FDA at 1-800-FDA-1088. Where should I keep my medicine? This drug is given in a hospital or clinic and  will not be stored at home. NOTE: This sheet is a summary. It may not cover all possible information. If you have questions about this medicine, talk to your doctor, pharmacist, or health care provider.  2018 Elsevier/Gold Standard (2014-03-19 14:19:39)    

## 2017-11-20 ENCOUNTER — Inpatient Hospital Stay (HOSPITAL_BASED_OUTPATIENT_CLINIC_OR_DEPARTMENT_OTHER): Payer: Self-pay | Admitting: Hematology and Oncology

## 2017-11-20 ENCOUNTER — Encounter: Payer: Self-pay | Admitting: Hematology and Oncology

## 2017-11-20 ENCOUNTER — Encounter (HOSPITAL_COMMUNITY): Payer: Self-pay

## 2017-11-20 ENCOUNTER — Inpatient Hospital Stay: Payer: Self-pay

## 2017-11-20 VITALS — BP 116/80 | HR 89 | Temp 98.7°F | Resp 17

## 2017-11-20 VITALS — BP 112/73 | HR 85 | Temp 97.9°F | Resp 17 | Wt 97.3 lb

## 2017-11-20 DIAGNOSIS — Z5111 Encounter for antineoplastic chemotherapy: Secondary | ICD-10-CM

## 2017-11-20 DIAGNOSIS — C9000 Multiple myeloma not having achieved remission: Secondary | ICD-10-CM

## 2017-11-20 DIAGNOSIS — Z7189 Other specified counseling: Secondary | ICD-10-CM

## 2017-11-20 LAB — CHROMOSOME ANALYSIS, BONE MARROW

## 2017-11-20 LAB — TISSUE HYBRIDIZATION (BONE MARROW)-NCBH

## 2017-11-20 MED ORDER — BORTEZOMIB CHEMO SQ INJECTION 3.5 MG (2.5MG/ML)
1.3000 mg/m2 | Freq: Once | INTRAMUSCULAR | Status: AC
  Administered 2017-11-20: 1.75 mg via SUBCUTANEOUS
  Filled 2017-11-20: qty 1.75

## 2017-11-20 MED ORDER — PROCHLORPERAZINE MALEATE 10 MG PO TABS
10.0000 mg | ORAL_TABLET | Freq: Once | ORAL | Status: AC
  Administered 2017-11-20: 10 mg via ORAL

## 2017-11-20 NOTE — Patient Instructions (Signed)
[facility] Discharge Instructions for Patients Receiving Chemotherapy  Today you received the following chemotherapy agents Velcade To help prevent nausea and vomiting after your treatment, we encourage you to take your nausea medication as prescribed.   If you develop nausea and vomiting that is not controlled by your nausea medication, call the clinic.   BELOW ARE SYMPTOMS THAT SHOULD BE REPORTED IMMEDIATELY:  *FEVER GREATER THAN 100.5 F  *CHILLS WITH OR WITHOUT FEVER  NAUSEA AND VOMITING THAT IS NOT CONTROLLED WITH YOUR NAUSEA MEDICATION  *UNUSUAL SHORTNESS OF BREATH  *UNUSUAL BRUISING OR BLEEDING  TENDERNESS IN MOUTH AND THROAT WITH OR WITHOUT PRESENCE OF ULCERS  *URINARY PROBLEMS  *BOWEL PROBLEMS  UNUSUAL RASH Items with * indicate a potential emergency and should be followed up as soon as possible.  Feel free to call the clinic should you have any questions or concerns. The clinic phone number is [phone].  Please show the CHEMO ALERT CARD at check-in to the Emergency Department and triage nurse.   

## 2017-11-24 ENCOUNTER — Inpatient Hospital Stay: Payer: Self-pay

## 2017-11-24 DIAGNOSIS — C9000 Multiple myeloma not having achieved remission: Secondary | ICD-10-CM

## 2017-11-24 LAB — CMP (CANCER CENTER ONLY)
ALBUMIN: 3 g/dL — AB (ref 3.5–5.0)
ALT: 26 U/L (ref 0–55)
ANION GAP: 11 (ref 3–11)
AST: 52 U/L — ABNORMAL HIGH (ref 5–34)
Alkaline Phosphatase: 107 U/L (ref 40–150)
BUN: 5 mg/dL — ABNORMAL LOW (ref 7–26)
CALCIUM: 8.7 mg/dL (ref 8.4–10.4)
CHLORIDE: 99 mmol/L (ref 98–109)
CO2: 22 mmol/L (ref 22–29)
Creatinine: 0.84 mg/dL (ref 0.70–1.30)
GFR, Estimated: >60 mL/min (ref >60)
GLUCOSE: 94 mg/dL (ref 70–140)
POTASSIUM: 4.4 mmol/L (ref 3.5–5.1)
SODIUM: 132 mmol/L — AB (ref 136–145)
Total Bilirubin: 0.3 mg/dL (ref 0.2–1.2)
Total Protein: 9.6 g/dL — ABNORMAL HIGH (ref 6.4–8.3)

## 2017-11-24 LAB — CBC WITH DIFFERENTIAL (CANCER CENTER ONLY)
BASOS PCT: 0 %
Basophils Absolute: 0 10*3/uL (ref 0.0–0.1)
EOS ABS: 0 10*3/uL (ref 0.0–0.5)
EOS PCT: 0 %
HCT: 42.2 % (ref 38.4–49.9)
HEMOGLOBIN: 14.4 g/dL (ref 13.0–17.1)
LYMPHS ABS: 0.7 10*3/uL — AB (ref 0.9–3.3)
Lymphocytes Relative: 8 %
MCH: 29 pg (ref 27.2–33.4)
MCHC: 34.1 g/dL (ref 32.0–36.0)
MCV: 84.9 fL (ref 79.3–98.0)
MONOS PCT: 2 %
Monocytes Absolute: 0.1 10*3/uL (ref 0.1–0.9)
NEUTROS PCT: 90 %
Neutro Abs: 7.6 10*3/uL — ABNORMAL HIGH (ref 1.5–6.5)
PLATELETS: 120 10*3/uL — AB (ref 140–400)
RBC: 4.97 MIL/uL (ref 4.20–5.82)
RDW: 13.6 % (ref 11.0–15.6)
WBC Count: 8.5 10*3/uL (ref 4.0–10.3)

## 2017-11-24 LAB — LACTATE DEHYDROGENASE: LDH: 255 U/L — ABNORMAL HIGH (ref 125–245)

## 2017-11-25 LAB — KAPPA/LAMBDA LIGHT CHAINS: Kappa free light chain: UNDETERMINED mg/L

## 2017-11-25 LAB — BETA 2 MICROGLOBULIN, SERUM: BETA 2 MICROGLOBULIN: 1.9 mg/L (ref 0.6–2.4)

## 2017-11-26 LAB — MULTIPLE MYELOMA PANEL, SERUM
ALBUMIN SERPL ELPH-MCNC: 3.2 g/dL (ref 2.9–4.4)
ALPHA2 GLOB SERPL ELPH-MCNC: 1 g/dL (ref 0.4–1.0)
Albumin/Glob SerPl: 0.6 — ABNORMAL LOW (ref 0.7–1.7)
Alpha 1: 0.4 g/dL (ref 0.0–0.4)
B-GLOBULIN SERPL ELPH-MCNC: 1.1 g/dL (ref 0.7–1.3)
Gamma Glob SerPl Elph-Mcnc: 3 g/dL — ABNORMAL HIGH (ref 0.4–1.8)
Globulin, Total: 5.4 g/dL — ABNORMAL HIGH (ref 2.2–3.9)
IGG (IMMUNOGLOBIN G), SERUM: 3199 mg/dL — AB (ref 700–1600)
IgA: 111 mg/dL (ref 90–386)
IgM (Immunoglobulin M), Srm: 36 mg/dL (ref 20–172)
M Protein SerPl Elph-Mcnc: 2.5 g/dL — ABNORMAL HIGH
TOTAL PROTEIN ELP: 8.6 g/dL — AB (ref 6.0–8.5)

## 2017-11-27 ENCOUNTER — Other Ambulatory Visit: Payer: Self-pay

## 2017-11-27 DIAGNOSIS — D472 Monoclonal gammopathy: Secondary | ICD-10-CM

## 2017-11-27 LAB — KAPPA/LAMBDA LIGHT CHAINS
Kappa free light chain: 39.2 mg/L — ABNORMAL HIGH (ref 3.3–19.4)
Kappa, lambda light chain ratio: 4.84 — ABNORMAL HIGH (ref 0.26–1.65)
LAMDA FREE LIGHT CHAINS: 8.1 mg/L (ref 5.7–26.3)

## 2017-12-01 ENCOUNTER — Inpatient Hospital Stay: Payer: Self-pay

## 2017-12-01 ENCOUNTER — Other Ambulatory Visit: Payer: Self-pay | Admitting: *Deleted

## 2017-12-01 VITALS — BP 101/63 | HR 80 | Temp 98.2°F | Resp 18

## 2017-12-01 DIAGNOSIS — Z7189 Other specified counseling: Secondary | ICD-10-CM

## 2017-12-01 DIAGNOSIS — C9000 Multiple myeloma not having achieved remission: Secondary | ICD-10-CM

## 2017-12-01 DIAGNOSIS — D472 Monoclonal gammopathy: Secondary | ICD-10-CM

## 2017-12-01 LAB — CMP (CANCER CENTER ONLY)
ALK PHOS: 102 U/L (ref 40–150)
ALT: 13 U/L (ref 0–55)
AST: 27 U/L (ref 5–34)
Albumin: 2.7 g/dL — ABNORMAL LOW (ref 3.5–5.0)
Anion gap: 10 (ref 3–11)
BUN: 5 mg/dL — ABNORMAL LOW (ref 7–26)
CALCIUM: 8.1 mg/dL — AB (ref 8.4–10.4)
CHLORIDE: 97 mmol/L — AB (ref 98–109)
CO2: 22 mmol/L (ref 22–29)
CREATININE: 0.77 mg/dL (ref 0.70–1.30)
GFR, Est AFR Am: >60 mL/min (ref >60)
Glucose, Bld: 124 mg/dL (ref 70–140)
Potassium: 4.5 mmol/L (ref 3.5–5.1)
Sodium: 129 mmol/L — ABNORMAL LOW (ref 136–145)
Total Bilirubin: 0.2 mg/dL (ref 0.2–1.2)
Total Protein: 8.5 g/dL — ABNORMAL HIGH (ref 6.4–8.3)

## 2017-12-01 LAB — CBC WITH DIFFERENTIAL (CANCER CENTER ONLY)
BASOS ABS: 0 10*3/uL (ref 0.0–0.1)
BASOS PCT: 0 %
EOS ABS: 0 10*3/uL (ref 0.0–0.5)
Eosinophils Relative: 0 %
HCT: 37.1 % — ABNORMAL LOW (ref 38.4–49.9)
HEMOGLOBIN: 12.4 g/dL — AB (ref 13.0–17.1)
Lymphocytes Relative: 9 %
Lymphs Abs: 0.5 10*3/uL — ABNORMAL LOW (ref 0.9–3.3)
MCH: 28.3 pg (ref 27.2–33.4)
MCHC: 33.4 g/dL (ref 32.0–36.0)
MCV: 84.7 fL (ref 79.3–98.0)
Monocytes Absolute: 0 10*3/uL — ABNORMAL LOW (ref 0.1–0.9)
Monocytes Relative: 0 %
NEUTROS PCT: 91 %
Neutro Abs: 5.5 10*3/uL (ref 1.5–6.5)
Platelet Count: 514 10*3/uL — ABNORMAL HIGH (ref 140–400)
RBC: 4.38 MIL/uL (ref 4.20–5.82)
RDW: 13.3 % (ref 11.0–15.6)
WBC: 6.1 10*3/uL (ref 4.0–10.3)

## 2017-12-01 MED ORDER — SODIUM CHLORIDE 0.9 % IV SOLN
Freq: Once | INTRAVENOUS | Status: AC
  Administered 2017-12-01: 16:00:00 via INTRAVENOUS

## 2017-12-01 MED ORDER — BORTEZOMIB CHEMO SQ INJECTION 3.5 MG (2.5MG/ML)
1.3000 mg/m2 | Freq: Once | INTRAMUSCULAR | Status: AC
  Administered 2017-12-01: 1.75 mg via SUBCUTANEOUS
  Filled 2017-12-01: qty 1.75

## 2017-12-01 MED ORDER — PROCHLORPERAZINE MALEATE 10 MG PO TABS
10.0000 mg | ORAL_TABLET | Freq: Once | ORAL | Status: AC
  Administered 2017-12-01: 10 mg via ORAL

## 2017-12-01 MED ORDER — PROCHLORPERAZINE MALEATE 10 MG PO TABS
ORAL_TABLET | ORAL | Status: AC
  Filled 2017-12-01: qty 1

## 2017-12-01 NOTE — Patient Instructions (Signed)
[facility] Discharge Instructions for Patients Receiving Chemotherapy  Today you received the following chemotherapy agents:  Velcade  To help prevent nausea and vomiting after your treatment, we encourage you to take your nausea medication as prescribed.   If you develop nausea and vomiting that is not controlled by your nausea medication, call the clinic.   BELOW ARE SYMPTOMS THAT SHOULD BE REPORTED IMMEDIATELY:  *FEVER GREATER THAN 100.5 F  *CHILLS WITH OR WITHOUT FEVER  NAUSEA AND VOMITING THAT IS NOT CONTROLLED WITH YOUR NAUSEA MEDICATION  *UNUSUAL SHORTNESS OF BREATH  *UNUSUAL BRUISING OR BLEEDING  TENDERNESS IN MOUTH AND THROAT WITH OR WITHOUT PRESENCE OF ULCERS  *URINARY PROBLEMS  *BOWEL PROBLEMS  UNUSUAL RASH Items with * indicate a potential emergency and should be followed up as soon as possible.  Feel free to call the clinic should you have any questions or concerns. The clinic phone number is [phone].  Please show the [place] at check-in to the Emergency Department and triage nurse.   Dehydration, Adult Dehydration is a condition in which there is not enough fluid or water in the body. This happens when you lose more fluids than you take in. Important organs, such as the kidneys, brain, and heart, cannot function without a proper amount of fluids. Any loss of fluids from the body can lead to dehydration. Dehydration can range from mild to severe. This condition should be treated right away to prevent it from becoming severe. What are the causes? This condition may be caused by:  Vomiting.  Diarrhea.  Excessive sweating, such as from heat exposure or exercise.  Not drinking enough fluid, especially: ? When ill. ? While doing activity that requires a lot of energy.  Excessive urination.  Fever.  Infection.  Certain medicines, such as medicines that cause the body to lose excess fluid (diuretics).  Inability to access  safe drinking water.  Reduced physical ability to get adequate water and food.  What increases the risk? This condition is more likely to develop in people:  Who have a poorly controlled long-term (chronic) illness, such as diabetes, heart disease, or kidney disease.  Who are age 56 or older.  Who are disabled.  Who live in a place with high altitude.  Who play endurance sports.  What are the signs or symptoms? Symptoms of mild dehydration may include:  Thirst.  Dry lips.  Slightly dry mouth.  Dry, warm skin.  Dizziness. Symptoms of moderate dehydration may include:  Very dry mouth.  Muscle cramps.  Dark urine. Urine may be the color of tea.  Decreased urine production.  Decreased tear production.  Heartbeat that is irregular or faster than normal (palpitations).  Headache.  Light-headedness, especially when you stand up from a sitting position.  Fainting (syncope). Symptoms of severe dehydration may include:  Changes in skin, such as: ? Cold and clammy skin. ? Blotchy (mottled) or pale skin. ? Skin that does not quickly return to normal after being lightly pinched and released (poor skin turgor).  Changes in body fluids, such as: ? Extreme thirst. ? No tear production. ? Inability to sweat when body temperature is high, such as in hot weather. ? Very little urine production.  Changes in vital signs, such as: ? Weak pulse. ? Pulse that is more than 100 beats a minute when sitting still. ? Rapid breathing. ? Low blood pressure.  Other changes, such as: ? Sunken eyes. ? Cold hands and feet. ? Confusion. ?  Lack of energy (lethargy). ? Difficulty waking up from sleep. ? Short-term weight loss. ? Unconsciousness. How is this diagnosed? This condition is diagnosed based on your symptoms and a physical exam. Blood and urine tests may be done to help confirm the diagnosis. How is this treated? Treatment for this condition depends on the severity.  Mild or moderate dehydration can often be treated at home. Treatment should be started right away. Do not wait until dehydration becomes severe. Severe dehydration is an emergency and it needs to be treated in a hospital. Treatment for mild dehydration may include:  Drinking more fluids.  Replacing salts and minerals in your blood (electrolytes) that you may have lost. Treatment for moderate dehydration may include:  Drinking an oral rehydration solution (ORS). This is a drink that helps you replace fluids and electrolytes (rehydrate). It can be found at pharmacies and retail stores. Treatment for severe dehydration may include:  Receiving fluids through an IV tube.  Receiving an electrolyte solution through a feeding tube that is passed through your nose and into your stomach (nasogastric tube, or NG tube).  Correcting any abnormalities in electrolytes.  Treating the underlying cause of dehydration. Follow these instructions at home:  If directed by your health care provider, drink an ORS: ? Make an ORS by following instructions on the package. ? Start by drinking small amounts, about  cup (120 mL) every 5-10 minutes. ? Slowly increase how much you drink until you have taken the amount recommended by your health care provider.  Drink enough clear fluid to keep your urine clear or pale yellow. If you were told to drink an ORS, finish the ORS first, then start slowly drinking other clear fluids. Drink fluids such as: ? Water. Do not drink only water. Doing that can lead to having too little salt (sodium) in the body (hyponatremia). ? Ice chips. ? Fruit juice that you have added water to (diluted fruit juice). ? Low-calorie sports drinks.  Avoid: ? Alcohol. ? Drinks that contain a lot of sugar. These include high-calorie sports drinks, fruit juice that is not diluted, and soda. ? Caffeine. ? Foods that are greasy or contain a lot of fat or sugar.  Take over-the-counter and  prescription medicines only as told by your health care provider.  Do not take sodium tablets. This can lead to having too much sodium in the body (hypernatremia).  Eat foods that contain a healthy balance of electrolytes, such as bananas, oranges, potatoes, tomatoes, and spinach.  Keep all follow-up visits as told by your health care provider. This is important. Contact a health care provider if:  You have abdominal pain that: ? Gets worse. ? Stays in one area (localizes).  You have a rash.  You have a stiff neck.  You are more irritable than usual.  You are sleepier or more difficult to wake up than usual.  You feel weak or dizzy.  You feel very thirsty.  You have urinated only a small amount of very dark urine over 6-8 hours. Get help right away if:  You have symptoms of severe dehydration.  You cannot drink fluids without vomiting.  Your symptoms get worse with treatment.  You have a fever.  You have a severe headache.  You have vomiting or diarrhea that: ? Gets worse. ? Does not go away.  You have blood or green matter (bile) in your vomit.  You have blood in your stool. This may cause stool to look black and tarry.  You have not urinated in 6-8 hours.  You faint.  Your heart rate while sitting still is over 100 beats a minute.  You have trouble breathing. This information is not intended to replace advice given to you by your health care provider. Make sure you discuss any questions you have with your health care provider. Document Released: 10/21/2005 Document Revised: 05/17/2016 Document Reviewed: 12/15/2015 Elsevier Interactive Patient Education  [employer].

## 2017-12-04 ENCOUNTER — Inpatient Hospital Stay: Payer: Self-pay

## 2017-12-04 ENCOUNTER — Other Ambulatory Visit: Payer: Self-pay

## 2017-12-04 ENCOUNTER — Encounter: Payer: Self-pay | Admitting: Pharmacy Technician

## 2017-12-04 VITALS — BP 100/71 | HR 95 | Temp 98.9°F | Resp 19

## 2017-12-04 DIAGNOSIS — C9000 Multiple myeloma not having achieved remission: Secondary | ICD-10-CM

## 2017-12-04 DIAGNOSIS — Z7189 Other specified counseling: Secondary | ICD-10-CM

## 2017-12-04 MED ORDER — BORTEZOMIB CHEMO SQ INJECTION 3.5 MG (2.5MG/ML)
1.3000 mg/m2 | Freq: Once | INTRAMUSCULAR | Status: AC
  Administered 2017-12-04: 1.75 mg via SUBCUTANEOUS
  Filled 2017-12-04: qty 1.75

## 2017-12-04 MED ORDER — PROCHLORPERAZINE MALEATE 10 MG PO TABS
ORAL_TABLET | ORAL | Status: AC
  Filled 2017-12-04: qty 1

## 2017-12-04 MED ORDER — TRAMADOL HCL 50 MG PO TABS: 50.0000 mg | ORAL_TABLET | Freq: Four times a day (QID) | ORAL | 0 refills | Status: DC | PRN

## 2017-12-04 MED ORDER — PROCHLORPERAZINE MALEATE 10 MG PO TABS
10.0000 mg | ORAL_TABLET | Freq: Once | ORAL | Status: AC
  Administered 2017-12-04: 10 mg via ORAL

## 2017-12-04 MED FILL — traMADol HCL 50 MG TABS: 50 | 7 days supply | Qty: 60 | Fill #0

## 2017-12-04 NOTE — Patient Instructions (Signed)

## 2017-12-04 NOTE — Progress Notes (Signed)
The patient is approved for drug assistance by [place] [facility] for Velcade. The enrollment is from 12/04/17 - 12/04/18 and is based on Self Pay.  Drug replacement starts DOS 11/10/17.

## 2017-12-05 ENCOUNTER — Other Ambulatory Visit: Payer: Self-pay

## 2017-12-05 DIAGNOSIS — D472 Monoclonal gammopathy: Secondary | ICD-10-CM

## 2017-12-08 ENCOUNTER — Inpatient Hospital Stay: Payer: Self-pay

## 2017-12-08 ENCOUNTER — Inpatient Hospital Stay: Payer: Self-pay | Attending: Hematology

## 2017-12-08 ENCOUNTER — Inpatient Hospital Stay (HOSPITAL_BASED_OUTPATIENT_CLINIC_OR_DEPARTMENT_OTHER): Payer: Self-pay | Admitting: Hematology and Oncology

## 2017-12-08 ENCOUNTER — Encounter: Payer: Self-pay | Admitting: Hematology and Oncology

## 2017-12-08 VITALS — BP 126/76 | HR 88 | Temp 97.9°F | Resp 18 | Ht 60.0 in | Wt 91.8 lb

## 2017-12-08 DIAGNOSIS — D472 Monoclonal gammopathy: Secondary | ICD-10-CM

## 2017-12-08 DIAGNOSIS — C9000 Multiple myeloma not having achieved remission: Secondary | ICD-10-CM

## 2017-12-08 DIAGNOSIS — Z5112 Encounter for antineoplastic immunotherapy: Secondary | ICD-10-CM | POA: Insufficient documentation

## 2017-12-08 DIAGNOSIS — Z5111 Encounter for antineoplastic chemotherapy: Secondary | ICD-10-CM

## 2017-12-08 DIAGNOSIS — Z7189 Other specified counseling: Secondary | ICD-10-CM

## 2017-12-08 LAB — CBC WITH DIFFERENTIAL (CANCER CENTER ONLY)
BASOS PCT: 0 %
Basophils Absolute: 0 10*3/uL (ref 0.0–0.1)
EOS ABS: 0 10*3/uL (ref 0.0–0.5)
EOS PCT: 0 %
HCT: 38.5 % (ref 38.4–49.9)
Hemoglobin: 13.1 g/dL (ref 13.0–17.1)
LYMPHS PCT: 11 %
Lymphs Abs: 0.8 10*3/uL — ABNORMAL LOW (ref 0.9–3.3)
MCH: 28.2 pg (ref 27.2–33.4)
MCHC: 34 g/dL (ref 32.0–36.0)
MCV: 83 fL (ref 79.3–98.0)
MONO ABS: 0.2 10*3/uL (ref 0.1–0.9)
Monocytes Relative: 3 %
Neutro Abs: 6 10*3/uL (ref 1.5–6.5)
Neutrophils Relative %: 86 %
PLATELETS: 182 10*3/uL (ref 140–400)
RBC: 4.64 MIL/uL (ref 4.20–5.82)
RDW: 13.4 % (ref 11.0–14.6)
WBC: 7.1 10*3/uL (ref 4.0–10.3)

## 2017-12-08 LAB — CMP (CANCER CENTER ONLY)
ALK PHOS: 92 U/L (ref 40–150)
ALT: 16 U/L (ref 0–55)
AST: 49 U/L — AB (ref 5–34)
Albumin: 2.9 g/dL — ABNORMAL LOW (ref 3.5–5.0)
Anion gap: 12 — ABNORMAL HIGH (ref 3–11)
BILIRUBIN TOTAL: 0.2 mg/dL (ref 0.2–1.2)
BUN: 6 mg/dL — AB (ref 7–26)
CALCIUM: 8.6 mg/dL (ref 8.4–10.4)
CO2: 24 mmol/L (ref 22–29)
CREATININE: 0.8 mg/dL (ref 0.70–1.30)
Chloride: 98 mmol/L (ref 98–109)
Glucose, Bld: 80 mg/dL (ref 70–140)
Potassium: 4.8 mmol/L (ref 3.5–5.1)
Sodium: 134 mmol/L — ABNORMAL LOW (ref 136–145)
Total Protein: 8.5 g/dL — ABNORMAL HIGH (ref 6.4–8.3)

## 2017-12-08 MED ORDER — PROCHLORPERAZINE MALEATE 10 MG PO TABS
10.0000 mg | ORAL_TABLET | Freq: Once | ORAL | Status: AC
  Administered 2017-12-08: 10 mg via ORAL

## 2017-12-08 MED ORDER — PROCHLORPERAZINE MALEATE 10 MG PO TABS
ORAL_TABLET | ORAL | Status: AC
Start: 2017-12-08 — End: 2017-12-08
  Filled 2017-12-08: qty 1

## 2017-12-08 MED ORDER — BORTEZOMIB CHEMO SQ INJECTION 3.5 MG (2.5MG/ML)
1.3000 mg/m2 | Freq: Once | INTRAMUSCULAR | Status: AC
  Administered 2017-12-08: 1.75 mg via SUBCUTANEOUS
  Filled 2017-12-08: qty 1.75

## 2017-12-08 NOTE — Patient Instructions (Signed)
[facility] Discharge Instructions for Patients Receiving Chemotherapy  Today you received the following chemotherapy agents Velcade To help prevent nausea and vomiting after your treatment, we encourage you to take your nausea medication as prescribed.   If you develop nausea and vomiting that is not controlled by your nausea medication, call the clinic.   BELOW ARE SYMPTOMS THAT SHOULD BE REPORTED IMMEDIATELY:  *FEVER GREATER THAN 100.5 F  *CHILLS WITH OR WITHOUT FEVER  NAUSEA AND VOMITING THAT IS NOT CONTROLLED WITH YOUR NAUSEA MEDICATION  *UNUSUAL SHORTNESS OF BREATH  *UNUSUAL BRUISING OR BLEEDING  TENDERNESS IN MOUTH AND THROAT WITH OR WITHOUT PRESENCE OF ULCERS  *URINARY PROBLEMS  *BOWEL PROBLEMS  UNUSUAL RASH Items with * indicate a potential emergency and should be followed up as soon as possible.  Feel free to call the clinic should you have any questions or concerns. The clinic phone number is [phone].  Please show the CHEMO ALERT CARD at check-in to the Emergency Department and triage nurse.   

## 2017-12-09 MED FILL — DEXAMETHASONE 4 MG TABLET: 4 | 28 days supply | Qty: 20 | Fill #1

## 2017-12-09 NOTE — Progress Notes (Signed)
[facility] Follow-up Visit:  Assessment: Multiple myeloma ([place]) 57 y.o.  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  Based on the diagnosis, patient was started on treatment with lenalidomide, bortezomib, low-dose dexamethasone and is currently undergoing cycle 1 day 11 of therapy.  Tolerating treatment well without significant side effects at this time.  Clinical evaluation lab workup permissive to continue treated as scheduled.  Plan: -Proceed with bortezomib and dexamethasone to complete the cycle. - Return to clinic on 12/01/17 with labs, clinic visit, and possible initiation of the second cycle of systemic therapy with incorporation of lenalidomide and additional zoledronic acid. -Disease assessment with repeat bone marrow biopsy and PET/CT after 3 cycles of therapy.  If complete response is observed, patient will be referred for possible consolidative therapy with high-dose chemotherapy/autologous stem cell rescue at [facility].  If significant amount of residual disease is detected, patient will continue with additional 3 cycles of therapy to complete full 6 cycles of treatment with repeat disease assessment at that time.  Voice recognition software was used and creation of this note. Despite my best effort at editing the text, some misspelling/errors may have occurred.  Orders Placed This Encounter  Procedures  . CBC with Differential (Cancer Center Only)    Standing Status:   Future    Number of Occurrences:   1    Standing Expiration Date:   11/20/2018  . CMP ([place] only)    Standing Status:   Future    Number of Occurrences:   1    Standing Expiration Date:   11/20/2018  . Lactate dehydrogenase (LDH)    Standing Status:   Future   Number of Occurrences:   1    Standing Expiration Date:   11/20/2018  . Multiple Myeloma Panel (SPEP&IFE w/QIG)    Standing Status:   Future    Number of Occurrences:   1    Standing Expiration Date:   11/20/2018  . Kappa/lambda light chains    Standing Status:   Future    Number of Occurrences:   1    Standing Expiration Date:   11/20/2018  . Beta 2 microglobulin    Standing Status:   Future    Number of Occurrences:   1    Standing Expiration Date:   11/20/2018    Cancer Staging Multiple myeloma ([place]) Staging form: Plasma Cell Myeloma and Plasma Cell Disorders, AJCC 8th Edition - Clinical stage from 11/20/2017: RISS Stage II (Beta-2-microglobulin (mg/L): 2.1, Albumin (g/dL): 2.7, ISS: Stage II, High-risk cytogenetics: Absent, LDH: Normal) - Signed by Ardath Sax, MD on 12/09/2017   All questions were answered.  . The patient knows to call the clinic with any problems, questions or concerns.  This note was electronically signed.    History of Presenting Illness [name] [name] is a 57 y.o. male followed in the [facility] for monoclonal Gammopathy of unknown significance. Patient's past medical history is significant for recurrent left empyema and pulmonary hypertension. Patient has been treated with VATS/decortication of the left lung.Also has history or heterozygous alpha-1 Anti-trypsin deficiency, MZ phenotype, COPD.  Patient returns to the clinic for continued therapy with lenalidomide, bortezomib, low-dose dexamethasone regimen.  In the interim, patient denies any new complaints.  Currently, he is on cycle 1  day 11 of the treatment.  At this time he is receiving only bortezomib and dexamethasone due to risk of infection, with consideration of adding lenalidomide with cycle 2 of therapy.  Oncological/hematological History: --ECHO, 07/01/17: left ventricular ejection fraction 60 to 65%, normal Left ventricular wall thickness, impaired relaxation. --Labs, 08/26/17: SPEP --  restricted band in gamma globulins, suggestive of possible medical protein.    Multiple myeloma ([place])   09/29/2017 Tumor Marker    tProt 10.0, Alb 2.7, Ca 8.9, Cr 0.9, AP 75 LDH 169 SPEP -- M-Spike 3.4g/dL, SIFE -- IgG kappa;  IgG 4555, IgA 142, IgM 32; kappa 74.2, lambda 15.8, KLR 4.70;  WBC 5.2, Hgb 12.2, Plt 286      10/21/2017 PET scan    Hypermetabolic lytic lesion measuring 1.5 cm in L4. otherwise, no significant hypermetabolic skeletal disease.      11/03/2017 Bone Marrow Biopsy    Pathology: SLIGHTLY HYPERCELLULAR BONE MARROW FOR AGE WITH PLASMA CELL NEOPLASM. Peripheral blood RED BLOOD CELLS WITH ROULEAUX FORMATION. --The aspirate material is suboptimal with lack of bone marrow particles for evaluation. Touch imprints and core biopsy show a slightly hypercellular bone marrow for age with increased number of atypical plasma cells representing 24% of all cells on touch imprints and associated with interstitial infiltrates and small clusters in the core biopsy. This is seen in a background of a mixture of myeloid cell types. Immunohistochemical stains highlight the increased plasma cell component in the bone marrow, which shows kappa light chain restriction consistent with plasma cell neoplasm. 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        Medical History: Past Medical History:  Diagnosis Date  . Hypertension   . Kidney stones   . Pulmonary hypertension ([place])     Surgical History: Past Surgical History:  Procedure Laterality Date  . CARDIAC CATHETERIZATION N/A 01/18/2016   Procedure: Right Heart Cath;  Surgeon: Jolaine Artist, MD;   Location: [facility];  Service: Cardiovascular;  Laterality: N/A;  . CARDIAC CATHETERIZATION N/A 06/24/2016   Procedure: Right Heart Cath;  Surgeon: Jolaine Artist, MD;  Location: [facility];  Service: Cardiovascular;  Laterality: N/A;  . KIDNEY SURGERY    . VIDEO ASSISTED THORACOSCOPY (VATS)/DECORTICATION Left 2003  . VIDEO ASSISTED THORACOSCOPY (VATS)/DECORTICATION Left 10/02/2015   Procedure: VIDEO ASSISTED THORACOSCOPY (VATS)/DECORTICATION and drainage of chronic empyena;  Surgeon: [name], MD;  Location: [facility];  Service: Thoracic;  Laterality: Left;  Marland Kitchen VIDEO BRONCHOSCOPY N/A 10/02/2015   Procedure: VIDEO BRONCHOSCOPY;  Surgeon: [name], MD;  Location: [facility];  Service: Thoracic;  Laterality: N/A;    Family History: Family History  Problem Relation Age of Onset  . Hypertension Other     Social History: Social History   Socioeconomic History  . Marital status: Single    Spouse name: Not on file  . Number of children: Not on file  . Years of education: Not on file  . Highest education level: Not on file  Social Needs  . Financial resource strain: Not on file  . Food insecurity - worry: Not on file  . Food insecurity -  inability: Not on file  . Transportation needs - medical: Not on file  . Transportation needs - non-medical: Not on file  Occupational History  . Not on file  Tobacco Use  . Smoking status: Never Smoker  . Smokeless tobacco: Never Used  Substance and Sexual Activity  . Alcohol use: Yes  . Drug use: No  . Sexual activity: Not on file  Other Topics Concern  . Not on file  Social History Narrative  . Not on file    Allergies: No Known Allergies  Medications:  Current Outpatient Medications  Medication Sig Dispense Refill  . acyclovir (ZOVIRAX) 400 MG tablet Take 1 tablet (400 mg total) by mouth 2 (two) times daily. 60 tablet 3  . albuterol (PROVENTIL HFA;VENTOLIN HFA) 108 (90 Base) MCG/ACT inhaler Inhale 2 puffs  into the lungs every 6 (six) hours as needed for wheezing or shortness of breath. 1 Inhaler 2  . aspirin EC 325 MG EC tablet Take 1 tablet (325 mg total) by mouth daily. 30 tablet 3  . dexamethasone (DECADRON) 4 MG tablet Take 5 tablets (20 mg total) by mouth once a week. 20 tablet 3  . ondansetron (ZOFRAN) 8 MG tablet Take 1 tablet (8 mg total) by mouth 2 (two) times daily as needed (Nausea or vomiting). 30 tablet 1  . OXYGEN 2lpm BEDTIME ONLY    . tadalafil, PAH, (ADCIRCA) 20 MG tablet Take 2 tablets (40 mg total) by mouth daily. 60 tablet 11  . umeclidinium-vilanterol (ANORO ELLIPTA) 62.5-25 MCG/INH AEPB Inhale 1 puff into the lungs daily. 60 each 5  . traMADol (ULTRAM) 50 MG tablet Take 1-2 tablets (50-100 mg total) by mouth every 6 (six) hours as needed. 60 tablet 0   No current facility-administered medications for this visit.     Review of Systems: Review of Systems  All other systems reviewed and are negative.    PHYSICAL EXAMINATION Blood pressure 112/73, pulse 85, temperature 97.9 F (36.6 C), temperature source Oral, resp. rate 17, weight 97 lb 4.8 oz (44.1 kg), SpO2 94 %.  ECOG PERFORMANCE STATUS: 1 - Symptomatic but completely ambulatory  Physical Exam  Constitutional: He is oriented to person, place, and time and well-developed, well-nourished, and in no distress. No distress.  HENT:  Head: Normocephalic and atraumatic.  Mouth/Throat: Oropharynx is clear and moist. No oropharyngeal exudate.  Eyes: Conjunctivae are normal. Pupils are equal, round, and reactive to light. No scleral icterus.  Neck: No thyromegaly present.  Cardiovascular: Normal rate, regular rhythm and normal heart sounds.  No murmur heard. Pulmonary/Chest:  decreased breath sounds diffuse over the left lung, right lung with basilar crackles. No expiratory wheezing.  Abdominal: Soft. Bowel sounds are normal. He exhibits no distension. There is no tenderness. There is no rebound.  Musculoskeletal: He  exhibits no edema.  Lymphadenopathy:    He has no cervical adenopathy.  Neurological: He is alert and oriented to person, place, and time. He has normal reflexes. No cranial nerve deficit.  Skin: Skin is warm and dry. No rash noted. He is not diaphoretic. No erythema.     LABORATORY DATA: I have personally reviewed the data as listed: Appointment on 11/17/2017  Component Date Value Ref Range Status  . Sodium 11/17/2017 131* 136 - 145 mmol/L Final  . Potassium 11/17/2017 4.4  3.5 - 5.1 mmol/L Final  . Chloride 11/17/2017 95* 98 - 109 mmol/L Final  . CO2 11/17/2017 27  22 - 29 mmol/L Final  . Glucose, Bld 11/17/2017 135  70 - 140 mg/dL Final  . BUN 11/17/2017 10  7 - 26 mg/dL Final  . Creatinine 11/17/2017 0.93  0.70 - 1.30 mg/dL Final  . Calcium 11/17/2017 8.9  8.4 - 10.4 mg/dL Final  . Total Protein 11/17/2017 9.3* 6.4 - 8.3 g/dL Final  . Albumin 11/17/2017 2.7* 3.5 - 5.0 g/dL Final  . AST 11/17/2017 27  5 - 34 U/L Final  . ALT 11/17/2017 14  0 - 55 U/L Final  . Alkaline Phosphatase 11/17/2017 103  40 - 150 U/L Final  . Total Bilirubin 11/17/2017 <0.2* 0.2 - 1.2 mg/dL Final  . GFR, Est Non Af Am 11/17/2017 >60  >60 mL/min Final  . GFR, Est AFR Am 11/17/2017 >60  >60 mL/min Final   Comment: (NOTE) The eGFR has been calculated using the CKD EPI equation. This calculation has not been validated in all clinical situations. eGFR's persistently <60 mL/min signify possible Chronic Kidney Disease.   Georgiann Hahn [place] 11/17/2017 9  3 - 11 Final   Performed at [facility], [place] [address] [place], [place] [address]  . WBC Count 11/17/2017 7.7  4.0 - 10.3 K/uL Final  . RBC 11/17/2017 4.79  4.20 - 5.82 MIL/uL Final  . Hemoglobin 11/17/2017 13.8  13.0 - 17.1 g/dL Final  . HCT 11/17/2017 42.4  38.4 - 49.9 % Final  . MCV 11/17/2017 88.5  79.3 - 98.0 fL Final  . MCH 11/17/2017 28.7  27.2 - 33.4 pg Final  . MCHC 11/17/2017 32.5  32.0 - 36.0 g/dL Final  . RDW 11/17/2017  14.9  11.0 - 15.6 % Final  . Platelet Count 11/17/2017 305  140 - 400 K/uL Final  . Neutrophils Relative % 11/17/2017 93  % Final  . Neutro Abs 11/17/2017 7.1* 1.5 - 6.5 K/uL Final  . Lymphocytes Relative 11/17/2017 6  % Final  . Lymphs Abs 11/17/2017 0.5* 0.9 - 3.3 K/uL Final  . Monocytes Relative 11/17/2017 1  % Final  . Monocytes Absolute 11/17/2017 0.1  0.1 - 0.9 K/uL Final  . Eosinophils Relative 11/17/2017 0  % Final  . Eosinophils Absolute 11/17/2017 0.0  0.0 - 0.5 K/uL Final  . Basophils Relative 11/17/2017 0  % Final  . Basophils Absolute 11/17/2017 0.0  0.0 - 0.1 K/uL Final   Performed at [facility], [place] [address], [place], [place] [address]       Ardath Sax, MD

## 2017-12-09 NOTE — Assessment & Plan Note (Signed)
57 y.o.  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  Based on the diagnosis, patient was started on treatment with lenalidomide, bortezomib, low-dose dexamethasone and is currently undergoing cycle 1 day 11 of therapy.  Tolerating treatment well without significant side effects at this time.  Clinical evaluation lab workup permissive to continue treated as scheduled.  Plan: -Proceed with bortezomib and dexamethasone to complete the cycle. - Return to clinic on 12/01/17 with labs, clinic visit, and possible initiation of the second cycle of systemic therapy with incorporation of lenalidomide and additional zoledronic acid. -Disease assessment with repeat bone marrow biopsy and PET/CT after 3 cycles of therapy.  If complete response is observed, patient will be referred for possible consolidative therapy with high-dose chemotherapy/autologous stem cell rescue at [facility].  If significant amount of residual disease is detected, patient will continue with additional 3 cycles of therapy to complete full 6 cycles of treatment with repeat disease assessment at that time.

## 2017-12-10 ENCOUNTER — Other Ambulatory Visit: Payer: Self-pay | Admitting: Cardiothoracic Surgery

## 2017-12-10 DIAGNOSIS — J90 Pleural effusion, not elsewhere classified: Secondary | ICD-10-CM

## 2017-12-11 ENCOUNTER — Ambulatory Visit (INDEPENDENT_AMBULATORY_CARE_PROVIDER_SITE_OTHER): Payer: Self-pay | Admitting: Cardiothoracic Surgery

## 2017-12-11 ENCOUNTER — Encounter: Payer: Self-pay | Admitting: Cardiothoracic Surgery

## 2017-12-11 ENCOUNTER — Ambulatory Visit
Admission: RE | Admit: 2017-12-11 | Discharge: 2017-12-11 | Disposition: A | Discharge status: Home / Self Care | Payer: Self-pay | Source: Ambulatory Visit | Attending: Cardiothoracic Surgery | Admitting: Cardiothoracic Surgery

## 2017-12-11 ENCOUNTER — Other Ambulatory Visit: Payer: Self-pay

## 2017-12-11 ENCOUNTER — Inpatient Hospital Stay: Payer: Self-pay

## 2017-12-11 VITALS — BP 124/80 | HR 95 | Resp 18 | Ht 60.0 in | Wt 93.0 lb

## 2017-12-11 VITALS — BP 111/85 | HR 83 | Temp 98.1°F | Resp 17

## 2017-12-11 DIAGNOSIS — C9000 Multiple myeloma not having achieved remission: Secondary | ICD-10-CM

## 2017-12-11 DIAGNOSIS — J869 Pyothorax without fistula: Secondary | ICD-10-CM

## 2017-12-11 DIAGNOSIS — Z7189 Other specified counseling: Secondary | ICD-10-CM

## 2017-12-11 DIAGNOSIS — J90 Pleural effusion, not elsewhere classified: Secondary | ICD-10-CM

## 2017-12-11 MED ORDER — BORTEZOMIB CHEMO SQ INJECTION 3.5 MG (2.5MG/ML)
1.3000 mg/m2 | Freq: Once | INTRAMUSCULAR | Status: AC
  Administered 2017-12-11: 1.75 mg via SUBCUTANEOUS
  Filled 2017-12-11: qty 1.75

## 2017-12-11 MED ORDER — PROCHLORPERAZINE MALEATE 10 MG PO TABS
ORAL_TABLET | ORAL | Status: AC
  Filled 2017-12-11: qty 1

## 2017-12-11 MED ORDER — PROCHLORPERAZINE MALEATE 10 MG PO TABS
10.0000 mg | ORAL_TABLET | Freq: Once | ORAL | Status: AC
  Administered 2017-12-11: 10 mg via ORAL

## 2017-12-11 NOTE — Progress Notes (Signed)
[address]       [place] [address]             [phone]      Elyjah Shieh Govan Medical Record #[id number] Date of Birth: 1961-04-13  Referring: Lacretia Leigh, MD Primary Care: Patient, No Pcp Per  Chief Complaint:     FOLLOW UP empyema left chest History of Present Illness:     Patient now has been diagnosed with IgG kappa Multiple Myeloma  M spike of 3.4g/dl PET/CT shows - 15 mm lytic lesion with hypermetabolism in the left L4 vertebral body, suspicious for multiple myeloma.  He is now followed in the oncology clinic pulmonary clinic and congestive heart failure clinic.   Past Surgical History:  Procedure Laterality Date  . CARDIAC CATHETERIZATION N/A 01/18/2016   Procedure: Right Heart Cath;  Surgeon: Jolaine Artist, MD;  Location: [place];  Service: Cardiovascular;  Laterality: N/A;  . CARDIAC CATHETERIZATION N/A 06/24/2016   Procedure: Right Heart Cath;  Surgeon: Jolaine Artist, MD;  Location: [facility];  Service: Cardiovascular;  Laterality: N/A;  . KIDNEY SURGERY    . VIDEO ASSISTED THORACOSCOPY (VATS)/DECORTICATION Left 2003  . VIDEO ASSISTED THORACOSCOPY (VATS)/DECORTICATION Left 10/02/2015   Procedure: VIDEO ASSISTED THORACOSCOPY (VATS)/DECORTICATION and drainage of chronic empyena;  Surgeon: [name], MD;  Location: [facility];  Service: Thoracic;  Laterality: Left;  Marland Kitchen VIDEO BRONCHOSCOPY N/A 10/02/2015   Procedure: VIDEO BRONCHOSCOPY;  Surgeon: [name], MD;  Location: [facility];  Service: Thoracic;  Laterality: N/A;     Past Medical History:  Diagnosis Date  . Hypertension   . Kidney stones   . Pulmonary hypertension ([facility])      Social History   Tobacco Use  Smoking Status Never Smoker  Smokeless Tobacco Never Used    Social History   Substance and Sexual Activity  Alcohol Use Yes     No Known Allergies  Current Outpatient Medications  Medication Sig Dispense Refill  . acyclovir  (ZOVIRAX) 400 MG tablet Take 1 tablet (400 mg total) by mouth 2 (two) times daily. 60 tablet 3  . albuterol (PROVENTIL HFA;VENTOLIN HFA) 108 (90 Base) MCG/ACT inhaler Inhale 2 puffs into the lungs every 6 (six) hours as needed for wheezing or shortness of breath. 1 Inhaler 2  . aspirin EC 325 MG EC tablet Take 1 tablet (325 mg total) by mouth daily. 30 tablet 3  . dexamethasone (DECADRON) 4 MG tablet Take 5 tablets (20 mg total) by mouth once a week. 20 tablet 3  . ondansetron (ZOFRAN) 8 MG tablet Take 1 tablet (8 mg total) by mouth 2 (two) times daily as needed (Nausea or vomiting). 30 tablet 1  . OXYGEN 2lpm BEDTIME ONLY    . tadalafil, PAH, (ADCIRCA) 20 MG tablet Take 2 tablets (40 mg total) by mouth daily. 60 tablet 11  . traMADol (ULTRAM) 50 MG tablet Take 1-2 tablets (50-100 mg total) by mouth every 6 (six) hours as needed. 60 tablet 0  . umeclidinium-vilanterol (ANORO ELLIPTA) 62.5-25 MCG/INH AEPB Inhale 1 puff into the lungs daily. 60 each 5   No current facility-administered medications for this visit.        Physical Exam: BP 124/80 (BP Location: Right Arm, Patient Position: Sitting, Cuff Size: Normal)   Pulse 95   Resp 18   Ht 5' (1.524 m)   Wt 93 lb (42.2 kg)   SpO2 95% Comment: RA  BMI 18.16 kg/m   General appearance: alert, cooperative and no distress Head: Normocephalic, without obvious abnormality, atraumatic Neck: no adenopathy, no carotid bruit, no JVD, supple, symmetrical, trachea midline and thyroid not enlarged, symmetric, no tenderness/mass/nodules Lymph nodes: Cervical, supraclavicular, and axillary nodes normal. Resp: diminished breath sounds LLL Cardio: regular rate and rhythm, S1, S2 normal, no murmur, click, rub or gallop Extremities: extremities normal, atraumatic, no cyanosis or edema Neurologic: Grossly normal Currently in the office without oxygen, breathing comfortably  Diagnostic Studies & Laboratory data:     Recent Radiology Findings:   Dg  Chest 2 View  Result Date: 12/11/2017 CLINICAL DATA:  Left pleural effusion.  Multiple myeloma EXAM: CHEST  2 VIEW COMPARISON:  06/12/2017 FINDINGS: Chronic left pleural thickening unchanged. Mild left lower lobe airspace disease unchanged. Calcified pleural plaque on the right unchanged. Mild right pleural thickening. Heart size normal. Pulmonary artery enlargement compatible with pulmonary artery hypertension which appears severe. Negative for heart failure or edema. Negative for pneumonia IMPRESSION: Chronic pleural thickening bilaterally left greater than right is stable Marked pulmonary hypertension unchanged. Electronically Signed   By: Franchot Gallo M.D.   On: 12/11/2017 09:35   I have independently reviewed the above radiology studies  and reviewed the findings with the patient.   Recent Lab Findings: Lab Results  Component Value Date   WBC 7.1 12/08/2017   HGB 13.1 11/07/2017   HCT 38.5 12/08/2017   PLT 182 12/08/2017   GLUCOSE 80 12/08/2017   ALT 16 12/08/2017   AST 49 (H) 12/08/2017   NA 134 (L) 12/08/2017   K 4.8 12/08/2017   CL 98 12/08/2017   CREATININE 0.80 12/08/2017   BUN 6 (L) 12/08/2017   CO2 24 12/08/2017   INR 1.05 11/03/2017      Assessment / Plan:   Patient stable from empyema status currently undergoing treatment for multiple myeloma, going to the infusion center this afternoon Since the patient now is actively followed in the heart failure clinic pulmonary clinic and cancer center I have not made a return appointment for the surgical office but would be glad to see him as needed.  [name] MD      [address] [place] [address] Office [phone]   Beeper [phone]  12/11/2017 10:00 AM

## 2017-12-11 NOTE — Patient Instructions (Signed)
[facility] Discharge Instructions for Patients Receiving Chemotherapy  Today you received the following chemotherapy agents: Bortezomib (Velcade)  To help prevent nausea and vomiting after your treatment, we encourage you to take your nausea medication  as prescribed.    If you develop nausea and vomiting that is not controlled by your nausea medication, call the clinic.   BELOW ARE SYMPTOMS THAT SHOULD BE REPORTED IMMEDIATELY:  *FEVER GREATER THAN 100.5 F  *CHILLS WITH OR WITHOUT FEVER  NAUSEA AND VOMITING THAT IS NOT CONTROLLED WITH YOUR NAUSEA MEDICATION  *UNUSUAL SHORTNESS OF BREATH  *UNUSUAL BRUISING OR BLEEDING  TENDERNESS IN MOUTH AND THROAT WITH OR WITHOUT PRESENCE OF ULCERS  *URINARY PROBLEMS  *BOWEL PROBLEMS  UNUSUAL RASH Items with * indicate a potential emergency and should be followed up as soon as possible.  Feel free to call the clinic should you have any questions or concerns. The clinic phone number is [phone].  Please show the CHEMO ALERT CARD at check-in to the Emergency Department and triage nurse.   

## 2017-12-12 ENCOUNTER — Other Ambulatory Visit: Payer: Self-pay | Admitting: *Deleted

## 2017-12-22 ENCOUNTER — Telehealth: Payer: Self-pay

## 2017-12-22 ENCOUNTER — Inpatient Hospital Stay (HOSPITAL_BASED_OUTPATIENT_CLINIC_OR_DEPARTMENT_OTHER): Payer: Self-pay | Admitting: Hematology and Oncology

## 2017-12-22 ENCOUNTER — Inpatient Hospital Stay: Payer: Self-pay

## 2017-12-22 ENCOUNTER — Encounter: Payer: Self-pay | Admitting: Hematology and Oncology

## 2017-12-22 VITALS — BP 110/75 | HR 76 | Temp 98.6°F | Resp 18 | Ht 60.0 in | Wt 91.0 lb

## 2017-12-22 DIAGNOSIS — C9000 Multiple myeloma not having achieved remission: Secondary | ICD-10-CM

## 2017-12-22 DIAGNOSIS — Z5111 Encounter for antineoplastic chemotherapy: Secondary | ICD-10-CM

## 2017-12-22 DIAGNOSIS — R1012 Left upper quadrant pain: Secondary | ICD-10-CM

## 2017-12-22 DIAGNOSIS — M545 Low back pain: Secondary | ICD-10-CM

## 2017-12-22 DIAGNOSIS — Z7189 Other specified counseling: Secondary | ICD-10-CM

## 2017-12-22 DIAGNOSIS — R109 Unspecified abdominal pain: Secondary | ICD-10-CM

## 2017-12-22 LAB — CMP (CANCER CENTER ONLY)
ALBUMIN: 2.5 g/dL — AB (ref 3.5–5.0)
ALK PHOS: 82 U/L (ref 40–150)
ALT: 15 U/L (ref 0–55)
AST: 29 U/L (ref 5–34)
Anion gap: 9 (ref 3–11)
BUN: 11 mg/dL (ref 7–26)
CO2: 24 mmol/L (ref 22–29)
CREATININE: 0.95 mg/dL (ref 0.70–1.30)
Calcium: 8.6 mg/dL (ref 8.4–10.4)
Chloride: 99 mmol/L (ref 98–109)
GFR, Estimated: >60 mL/min (ref >60)
GLUCOSE: 107 mg/dL (ref 70–140)
Potassium: 4.1 mmol/L (ref 3.5–5.1)
Sodium: 132 mmol/L — ABNORMAL LOW (ref 136–145)
Total Bilirubin: 0.2 mg/dL (ref 0.2–1.2)
Total Protein: 7.5 g/dL (ref 6.4–8.3)

## 2017-12-22 LAB — CBC WITH DIFFERENTIAL (CANCER CENTER ONLY)
Basophils Absolute: 0 10*3/uL (ref 0.0–0.1)
Basophils Relative: 1 %
EOS ABS: 0 10*3/uL (ref 0.0–0.5)
Eosinophils Relative: 0 %
HCT: 33.8 % — ABNORMAL LOW (ref 38.4–49.9)
Hemoglobin: 11.1 g/dL — ABNORMAL LOW (ref 13.0–17.1)
Lymphocytes Relative: 11 %
Lymphs Abs: 0.7 10*3/uL — ABNORMAL LOW (ref 0.9–3.3)
MCH: 27.7 pg (ref 27.2–33.4)
MCHC: 32.8 g/dL (ref 32.0–36.0)
MCV: 84.7 fL (ref 79.3–98.0)
MONO ABS: 0.6 10*3/uL (ref 0.1–0.9)
MONOS PCT: 9 %
Neutro Abs: 5 10*3/uL (ref 1.5–6.5)
Neutrophils Relative %: 79 %
Platelet Count: 628 10*3/uL — ABNORMAL HIGH (ref 140–400)
RBC: 3.99 MIL/uL — ABNORMAL LOW (ref 4.20–5.82)
RDW: 14 % (ref 11.0–14.6)
WBC Count: 6.4 10*3/uL (ref 4.0–10.3)

## 2017-12-22 LAB — URINALYSIS, COMPLETE (UACMP) WITH MICROSCOPIC
BACTERIA UA: NONE SEEN
BILIRUBIN URINE: NEGATIVE
GLUCOSE, UA: NEGATIVE mg/dL
Hgb urine dipstick: NEGATIVE
KETONES UR: NEGATIVE mg/dL
LEUKOCYTES UA: NEGATIVE
NITRITE: NEGATIVE
PROTEIN: NEGATIVE mg/dL
Specific Gravity, Urine: 1.011 (ref 1.005–1.030)
Squamous Epithelial / LPF: NONE SEEN
pH: 8 (ref 5.0–8.0)

## 2017-12-22 LAB — PHOSPHORUS: Phosphorus: 1.8 mg/dL — ABNORMAL LOW (ref 2.5–4.6)

## 2017-12-22 LAB — MAGNESIUM: Magnesium: 2.4 mg/dL (ref 1.5–2.5)

## 2017-12-22 LAB — LACTATE DEHYDROGENASE: LDH: 214 U/L (ref 125–245)

## 2017-12-22 MED ORDER — PROCHLORPERAZINE MALEATE 10 MG PO TABS
ORAL_TABLET | ORAL | Status: AC
  Filled 2017-12-22: qty 1

## 2017-12-22 MED ORDER — SODIUM CHLORIDE 0.9 % IV SOLN
Freq: Once | INTRAVENOUS | Status: AC
  Administered 2017-12-22: 15:00:00 via INTRAVENOUS

## 2017-12-22 MED ORDER — ZOLEDRONIC ACID 4 MG/5ML IV CONC
3.5000 mg | Freq: Once | INTRAVENOUS | Status: AC
  Administered 2017-12-22: 3.5 mg via INTRAVENOUS
  Filled 2017-12-22: qty 4.38

## 2017-12-22 MED ORDER — BORTEZOMIB CHEMO SQ INJECTION 3.5 MG (2.5MG/ML)
1.3000 mg/m2 | Freq: Once | INTRAMUSCULAR | Status: AC
  Administered 2017-12-22: 1.75 mg via SUBCUTANEOUS
  Filled 2017-12-22: qty 1.75

## 2017-12-22 MED ORDER — PROCHLORPERAZINE MALEATE 10 MG PO TABS
10.0000 mg | ORAL_TABLET | Freq: Once | ORAL | Status: AC
  Administered 2017-12-22: 10 mg via ORAL

## 2017-12-22 NOTE — Patient Instructions (Signed)
[facility] Discharge Instructions for Patients Receiving Chemotherapy  Today you received the following chemotherapy agents: Velcade and Zometa  To help prevent nausea and vomiting after your treatment, we encourage you to take your nausea medication as directed.   If you develop nausea and vomiting that is not controlled by your nausea medication, call the clinic.   BELOW ARE SYMPTOMS THAT SHOULD BE REPORTED IMMEDIATELY:  *FEVER GREATER THAN 100.5 F  *CHILLS WITH OR WITHOUT FEVER  NAUSEA AND VOMITING THAT IS NOT CONTROLLED WITH YOUR NAUSEA MEDICATION  *UNUSUAL SHORTNESS OF BREATH  *UNUSUAL BRUISING OR BLEEDING  TENDERNESS IN MOUTH AND THROAT WITH OR WITHOUT PRESENCE OF ULCERS  *URINARY PROBLEMS  *BOWEL PROBLEMS  UNUSUAL RASH Items with * indicate a potential emergency and should be followed up as soon as possible.  Feel free to call the clinic should you have any questions or concerns. The clinic phone number is [phone].  Please show the CHEMO ALERT CARD at check-in to the Emergency Department and triage nurse.   

## 2017-12-22 NOTE — Telephone Encounter (Signed)
Per Central Scheduling, no appointment times available for CT Abd/Pelvis today. Patient scheduled for 8:30 am 12/23/17. Patient and his interpreter made aware and verbalized understanding of appointment time and where to report at 8:30am.

## 2017-12-23 ENCOUNTER — Ambulatory Visit (HOSPITAL_COMMUNITY)
Admission: RE | Admit: 2017-12-23 | Discharge: 2017-12-23 | Disposition: A | Discharge status: Home / Self Care | Payer: Self-pay | Source: Ambulatory Visit | Attending: Hematology and Oncology | Admitting: Hematology and Oncology

## 2017-12-23 DIAGNOSIS — R1012 Left upper quadrant pain: Secondary | ICD-10-CM | POA: Insufficient documentation

## 2017-12-23 DIAGNOSIS — Z77090 Contact with and (suspected) exposure to asbestos: Secondary | ICD-10-CM | POA: Insufficient documentation

## 2017-12-23 LAB — BETA 2 MICROGLOBULIN, SERUM: BETA 2 MICROGLOBULIN: 2.8 mg/L — AB (ref 0.6–2.4)

## 2017-12-23 LAB — KAPPA/LAMBDA LIGHT CHAINS
Kappa free light chain: 28.7 mg/L — ABNORMAL HIGH (ref 3.3–19.4)
Kappa, lambda light chain ratio: 2.96 — ABNORMAL HIGH (ref 0.26–1.65)
LAMDA FREE LIGHT CHAINS: 9.7 mg/L (ref 5.7–26.3)

## 2017-12-24 ENCOUNTER — Other Ambulatory Visit: Payer: Self-pay

## 2017-12-24 DIAGNOSIS — C9000 Multiple myeloma not having achieved remission: Secondary | ICD-10-CM

## 2017-12-24 LAB — URINE CULTURE: Culture: NO GROWTH

## 2017-12-25 ENCOUNTER — Inpatient Hospital Stay: Payer: Self-pay

## 2017-12-25 ENCOUNTER — Telehealth: Payer: Self-pay

## 2017-12-25 DIAGNOSIS — C9000 Multiple myeloma not having achieved remission: Secondary | ICD-10-CM

## 2017-12-25 DIAGNOSIS — Z7189 Other specified counseling: Secondary | ICD-10-CM

## 2017-12-25 LAB — CBC WITH DIFFERENTIAL (CANCER CENTER ONLY)
BASOS PCT: 1 %
Basophils Absolute: 0.1 10*3/uL (ref 0.0–0.1)
EOS PCT: 2 %
Eosinophils Absolute: 0.1 10*3/uL (ref 0.0–0.5)
HCT: 36.8 % — ABNORMAL LOW (ref 38.4–49.9)
HEMOGLOBIN: 11.9 g/dL — AB (ref 13.0–17.1)
Lymphocytes Relative: 19 %
Lymphs Abs: 1.4 10*3/uL (ref 0.9–3.3)
MCH: 27.2 pg (ref 27.2–33.4)
MCHC: 32.3 g/dL (ref 32.0–36.0)
MCV: 84.3 fL (ref 79.3–98.0)
MONOS PCT: 15 %
Monocytes Absolute: 1.1 10*3/uL — ABNORMAL HIGH (ref 0.1–0.9)
NEUTROS PCT: 63 %
Neutro Abs: 4.5 10*3/uL (ref 1.5–6.5)
PLATELETS: 511 10*3/uL — AB (ref 140–400)
RBC: 4.36 MIL/uL (ref 4.20–5.82)
RDW: 14.2 % (ref 11.0–14.6)
WBC: 7.1 10*3/uL (ref 4.0–10.3)

## 2017-12-25 LAB — MULTIPLE MYELOMA PANEL, SERUM
Albumin SerPl Elph-Mcnc: 2.7 g/dL — ABNORMAL LOW (ref 2.9–4.4)
Albumin/Glob SerPl: 0.7 (ref 0.7–1.7)
Alpha 1: 0.3 g/dL (ref 0.0–0.4)
Alpha2 Glob SerPl Elph-Mcnc: 0.8 g/dL (ref 0.4–1.0)
B-Globulin SerPl Elph-Mcnc: 0.9 g/dL (ref 0.7–1.3)
Gamma Glob SerPl Elph-Mcnc: 2.3 g/dL — ABNORMAL HIGH (ref 0.4–1.8)
Globulin, Total: 4.3 g/dL — ABNORMAL HIGH (ref 2.2–3.9)
IgA: 48 mg/dL — ABNORMAL LOW (ref 90–386)
IgG (Immunoglobin G), Serum: 2407 mg/dL — ABNORMAL HIGH (ref 700–1600)
IgM (Immunoglobulin M), Srm: 24 mg/dL (ref 20–172)
M Protein SerPl Elph-Mcnc: 1.8 g/dL — ABNORMAL HIGH
Total Protein ELP: 7 g/dL (ref 6.0–8.5)

## 2017-12-25 LAB — CMP (CANCER CENTER ONLY)
ALK PHOS: 79 U/L (ref 40–150)
ALT: 14 U/L (ref 0–55)
AST: 25 U/L (ref 5–34)
Albumin: 2.6 g/dL — ABNORMAL LOW (ref 3.5–5.0)
Anion gap: 9 (ref 3–11)
BILIRUBIN TOTAL: 0.2 mg/dL (ref 0.2–1.2)
BUN: 8 mg/dL (ref 7–26)
CALCIUM: 8.4 mg/dL (ref 8.4–10.4)
CO2: 25 mmol/L (ref 22–29)
Chloride: 100 mmol/L (ref 98–109)
Creatinine: 0.81 mg/dL (ref 0.70–1.30)
Glucose, Bld: 84 mg/dL (ref 70–140)
Potassium: 4.2 mmol/L (ref 3.5–5.1)
Sodium: 134 mmol/L — ABNORMAL LOW (ref 136–145)
Total Protein: 7.3 g/dL (ref 6.4–8.3)

## 2017-12-25 MED ORDER — BORTEZOMIB CHEMO SQ INJECTION 3.5 MG (2.5MG/ML)
1.3000 mg/m2 | Freq: Once | INTRAMUSCULAR | Status: AC
  Administered 2017-12-25: 1.75 mg via SUBCUTANEOUS
  Filled 2017-12-25: qty 1.75

## 2017-12-25 MED ORDER — PROCHLORPERAZINE MALEATE 10 MG PO TABS
10.0000 mg | ORAL_TABLET | Freq: Once | ORAL | Status: AC
  Administered 2017-12-25: 10 mg via ORAL

## 2017-12-25 MED ORDER — PROCHLORPERAZINE MALEATE 10 MG PO TABS
ORAL_TABLET | ORAL | Status: AC
Start: 2017-12-25 — End: 2017-12-25
  Filled 2017-12-25: qty 1

## 2017-12-25 NOTE — Patient Instructions (Signed)

## 2017-12-25 NOTE — Telephone Encounter (Addendum)
Pt friend, Anderson Malta, left message this AM while pt, interpreter, and friend waiting in the lobby and requested refill of tramadol. Dr. Lebron Conners out of the office today, and message given to this RN for Dr. Irene Limbo to fill. Per Dr. Irene Limbo, pt was given this medication initially with the expectation that a refill would not be required, but at the next visit a refill was given. This RN spoke with nurse who would be working with the pt in infusion, Stanton Kidney, to determine if the pt was having pain, how often he was taking his tramadol, and if he was out of the medication.   After RN assessment in infusion, this RN spoke with Berniece Andreas, RN who reported that the patient would attempt to answer questions, but the friend, Anderson Malta, would answer for the patient. Anderson Malta said at one point, when patient was asked what their relationship was, "Sometimes I am his wife, sometimes I am his girlfriend, and sometimes I am his friend. Depends on the day." Infusion RN asked the patient if he was in any pain, and he responded, "No." RN asked if the pt needed a refill of his tramadol and looked over at [place] to ask her. When pt was asked how often he has been taking his tramadol, Anderson Malta responded, "Well, he has been out for some time." Unrelated to pt's pain, infusion RN asked if the pt was hungry, and Anderson Malta responded, "No, he's not hungry." Infusion RN asked, "Have you had anything to eat for lunch," and Anderson Malta responded, "I fed him some grapes."  Infusion RN pulled interpreter to the side to determine what the patient's current living situation is and how Anderson Malta fit into his care. Pt is currently living with his niece who is out of the country for three months. So, the patient is currently staying at his niece's house alone. Interpreter was asked to speak to the pt in his language to ask if Anderson Malta was taking the patient's pain medication and he responded, "I am their friend and am not comfortable asking that."  Anderson Malta  returned over to the collaborative side and spoke with Lynnae Sandhoff, NT again to pass along another message that the pt was out of his tramadol and needed a refill because the pharmacy said that he could not get any more. Per this RN, NT was instructed to explain to Anderson Malta that there would not be any refills until Dr. Clydene Laming return to the office.  Called Dr. Lebron Conners on the phone to explain the situation. MD in agreement that there should not be a refill at this time. He said, "I have already explained to his friend, [Jennifer], that the patient's prescription will be refilled no more than once a month." Natalie, RN spoke with Anderson Malta over the phone to relay this message, and her response was, "Okay, that's fine."  Any further discussion will left to Dr. Lebron Conners upon his return.

## 2017-12-26 ENCOUNTER — Telehealth: Payer: Self-pay

## 2017-12-26 NOTE — Telephone Encounter (Signed)
Called pt mobile phone and spoke with patient friend, Anderson Malta, to let the pt know that he did not need to get lab work on Monday, just to come in for his infusion appt at 1415. Pt friend verbalized understanding and would make change to pt schedule.

## 2017-12-29 ENCOUNTER — Inpatient Hospital Stay: Payer: Self-pay

## 2017-12-29 ENCOUNTER — Other Ambulatory Visit: Payer: Self-pay

## 2017-12-29 VITALS — BP 124/75 | HR 99 | Temp 97.9°F | Resp 18

## 2017-12-29 DIAGNOSIS — C9000 Multiple myeloma not having achieved remission: Secondary | ICD-10-CM

## 2017-12-29 DIAGNOSIS — Z7189 Other specified counseling: Secondary | ICD-10-CM

## 2017-12-29 MED ORDER — PROCHLORPERAZINE MALEATE 10 MG PO TABS
10.0000 mg | ORAL_TABLET | Freq: Once | ORAL | Status: AC
  Administered 2017-12-29: 10 mg via ORAL

## 2017-12-29 MED ORDER — BORTEZOMIB CHEMO SQ INJECTION 3.5 MG (2.5MG/ML)
1.3000 mg/m2 | Freq: Once | INTRAMUSCULAR | Status: AC
  Administered 2017-12-29: 1.75 mg via SUBCUTANEOUS
  Filled 2017-12-29: qty 1.75

## 2017-12-29 MED ORDER — PROCHLORPERAZINE MALEATE 10 MG PO TABS
ORAL_TABLET | ORAL | Status: AC
Start: 2017-12-29 — End: 2017-12-29
  Filled 2017-12-29: qty 1

## 2018-01-01 ENCOUNTER — Other Ambulatory Visit: Payer: Self-pay

## 2018-01-01 ENCOUNTER — Inpatient Hospital Stay: Payer: Self-pay

## 2018-01-01 ENCOUNTER — Other Ambulatory Visit: Payer: Self-pay | Admitting: Radiology

## 2018-01-01 VITALS — BP 103/67 | HR 74 | Temp 98.3°F | Resp 20

## 2018-01-01 DIAGNOSIS — D472 Monoclonal gammopathy: Secondary | ICD-10-CM

## 2018-01-01 DIAGNOSIS — C9000 Multiple myeloma not having achieved remission: Secondary | ICD-10-CM

## 2018-01-01 DIAGNOSIS — Z7189 Other specified counseling: Secondary | ICD-10-CM

## 2018-01-01 LAB — CBC WITH DIFFERENTIAL (CANCER CENTER ONLY)
BASOS ABS: 0 10*3/uL (ref 0.0–0.1)
Basophils Relative: 1 %
EOS ABS: 0.1 10*3/uL (ref 0.0–0.5)
Eosinophils Relative: 1 %
HCT: 33.2 % — ABNORMAL LOW (ref 38.4–49.9)
HEMOGLOBIN: 10.8 g/dL — AB (ref 13.0–17.1)
LYMPHS ABS: 1.4 10*3/uL (ref 0.9–3.3)
LYMPHS PCT: 16 %
MCH: 26.9 pg — AB (ref 27.2–33.4)
MCHC: 32.5 g/dL (ref 32.0–36.0)
MCV: 82.8 fL (ref 79.3–98.0)
Monocytes Absolute: 1.3 10*3/uL — ABNORMAL HIGH (ref 0.1–0.9)
Monocytes Relative: 14 %
NEUTROS PCT: 68 %
Neutro Abs: 6.3 10*3/uL (ref 1.5–6.5)
PLATELETS: 133 10*3/uL — AB (ref 140–400)
RBC: 4.01 MIL/uL — AB (ref 4.20–5.82)
RDW: 14.5 % (ref 11.0–14.6)
WBC: 9.1 10*3/uL (ref 4.0–10.3)

## 2018-01-01 LAB — CMP (CANCER CENTER ONLY)
ALBUMIN: 2.5 g/dL — AB (ref 3.5–5.0)
ALK PHOS: 84 U/L (ref 40–150)
ALT: 17 U/L (ref 0–55)
AST: 32 U/L (ref 5–34)
Anion gap: 8 (ref 3–11)
BUN: 6 mg/dL — ABNORMAL LOW (ref 7–26)
CHLORIDE: 100 mmol/L (ref 98–109)
CO2: 23 mmol/L (ref 22–29)
CREATININE: 0.76 mg/dL (ref 0.70–1.30)
Calcium: 7.9 mg/dL — ABNORMAL LOW (ref 8.4–10.4)
GFR, Estimated: >60 mL/min (ref >60)
GLUCOSE: 86 mg/dL (ref 70–140)
Potassium: 4.2 mmol/L (ref 3.5–5.1)
SODIUM: 131 mmol/L — AB (ref 136–145)
Total Bilirubin: 0.3 mg/dL (ref 0.2–1.2)
Total Protein: 6.6 g/dL (ref 6.4–8.3)

## 2018-01-01 MED ORDER — PROCHLORPERAZINE MALEATE 10 MG PO TABS
10.0000 mg | ORAL_TABLET | Freq: Once | ORAL | Status: AC
  Administered 2018-01-01: 10 mg via ORAL

## 2018-01-01 MED ORDER — PROCHLORPERAZINE MALEATE 10 MG PO TABS
ORAL_TABLET | ORAL | Status: AC
  Filled 2018-01-01: qty 1

## 2018-01-01 MED ORDER — BORTEZOMIB CHEMO SQ INJECTION 3.5 MG (2.5MG/ML)
1.3000 mg/m2 | Freq: Once | INTRAMUSCULAR | Status: AC
  Administered 2018-01-01: 1.75 mg via SUBCUTANEOUS
  Filled 2018-01-01: qty 1.75

## 2018-01-01 NOTE — Patient Instructions (Signed)
[facility] Discharge Instructions for Patients Receiving Chemotherapy  Today you received the following chemotherapy agents: Bortezomib (Velcade)  To help prevent nausea and vomiting after your treatment, we encourage you to take your nausea medication  as prescribed.    If you develop nausea and vomiting that is not controlled by your nausea medication, call the clinic.   BELOW ARE SYMPTOMS THAT SHOULD BE REPORTED IMMEDIATELY:  *FEVER GREATER THAN 100.5 F  *CHILLS WITH OR WITHOUT FEVER  NAUSEA AND VOMITING THAT IS NOT CONTROLLED WITH YOUR NAUSEA MEDICATION  *UNUSUAL SHORTNESS OF BREATH  *UNUSUAL BRUISING OR BLEEDING  TENDERNESS IN MOUTH AND THROAT WITH OR WITHOUT PRESENCE OF ULCERS  *URINARY PROBLEMS  *BOWEL PROBLEMS  UNUSUAL RASH Items with * indicate a potential emergency and should be followed up as soon as possible.  Feel free to call the clinic should you have any questions or concerns. The clinic phone number is [phone].  Please show the CHEMO ALERT CARD at check-in to the Emergency Department and triage nurse.   

## 2018-01-02 ENCOUNTER — Encounter (HOSPITAL_COMMUNITY): Payer: Self-pay

## 2018-01-02 ENCOUNTER — Ambulatory Visit (HOSPITAL_COMMUNITY)
Admission: RE | Admit: 2018-01-02 | Discharge: 2018-01-02 | Disposition: A | Discharge status: Home / Self Care | Payer: Self-pay | Source: Ambulatory Visit | Attending: Hematology and Oncology | Admitting: Hematology and Oncology

## 2018-01-02 DIAGNOSIS — Z79899 Other long term (current) drug therapy: Secondary | ICD-10-CM | POA: Insufficient documentation

## 2018-01-02 DIAGNOSIS — Z7982 Long term (current) use of aspirin: Secondary | ICD-10-CM | POA: Insufficient documentation

## 2018-01-02 DIAGNOSIS — I272 Pulmonary hypertension, unspecified: Secondary | ICD-10-CM | POA: Insufficient documentation

## 2018-01-02 DIAGNOSIS — C9000 Multiple myeloma not having achieved remission: Secondary | ICD-10-CM | POA: Insufficient documentation

## 2018-01-02 DIAGNOSIS — I10 Essential (primary) hypertension: Secondary | ICD-10-CM | POA: Insufficient documentation

## 2018-01-02 LAB — CBC WITH DIFFERENTIAL/PLATELET
BASOS PCT: 0 %
Basophils Absolute: 0 10*3/uL (ref 0.0–0.1)
EOS ABS: 0.2 10*3/uL (ref 0.0–0.7)
EOS PCT: 2 %
HCT: 32.4 % — ABNORMAL LOW (ref 39.0–52.0)
Hemoglobin: 11.3 g/dL — ABNORMAL LOW (ref 13.0–17.0)
Lymphocytes Relative: 10 %
Lymphs Abs: 1.1 10*3/uL (ref 0.7–4.0)
MCH: 27.8 pg (ref 26.0–34.0)
MCHC: 34.9 g/dL (ref 30.0–36.0)
MCV: 79.8 fL (ref 78.0–100.0)
MONO ABS: 1.1 10*3/uL — AB (ref 0.1–1.0)
MONOS PCT: 10 %
Neutro Abs: 9 10*3/uL — ABNORMAL HIGH (ref 1.7–7.7)
Neutrophils Relative %: 78 %
PLATELETS: 113 10*3/uL — AB (ref 150–400)
RBC: 4.06 MIL/uL — ABNORMAL LOW (ref 4.22–5.81)
RDW: 13.7 % (ref 11.5–15.5)
WBC: 11.4 10*3/uL — ABNORMAL HIGH (ref 4.0–10.5)

## 2018-01-02 LAB — PROTIME-INR
INR: 1
PROTHROMBIN TIME: 13.1 s (ref 11.4–15.2)

## 2018-01-02 MED ORDER — FENTANYL CITRATE (PF) 100 MCG/2ML IJ SOLN
INTRAMUSCULAR | Status: AC | PRN
  Administered 2018-01-02 (×2): 50 ug via INTRAVENOUS

## 2018-01-02 MED ORDER — MIDAZOLAM HCL 2 MG/2ML IJ SOLN
INTRAMUSCULAR | Status: AC | PRN
  Administered 2018-01-02 (×2): 1 mg via INTRAVENOUS

## 2018-01-02 MED ORDER — MIDAZOLAM HCL 2 MG/2ML IJ SOLN
INTRAMUSCULAR | Status: AC
  Filled 2018-01-02: qty 2

## 2018-01-02 MED ORDER — SODIUM CHLORIDE 0.9 % IV SOLN
INTRAVENOUS | Status: DC
  Administered 2018-01-02: 08:00:00 via INTRAVENOUS

## 2018-01-02 MED ORDER — FENTANYL CITRATE (PF) 100 MCG/2ML IJ SOLN
INTRAMUSCULAR | Status: AC
  Filled 2018-01-02: qty 2

## 2018-01-02 MED ORDER — LIDOCAINE HCL (PF) 1 % IJ SOLN
INTRAMUSCULAR | Status: AC | PRN
  Administered 2018-01-02: 20 mL

## 2018-01-02 NOTE — H&P (Signed)
Chief Complaint: Multiple Myeloma  Referring Physician(s): Rachel G  Supervising Physician: Aletta Edouard  Patient Status: [facility] - Out-pt  History of Present Illness: [name] [name] is a 57 y.o. male who has a history of pulmonary HTN with cor pulmonale, HTN, previous empyema that required chest tube and VATS in 2016 who is here today for a bone marrow biopsy.  He has a diagnosis of Multiple Myeloma and needs the biopsy to evaluate progress after 3 cycles of therapy.    He is known to our service. He underwent a bone marrow biopsy by Dr. Pascal Lux on 11/03/2017.  He is followed by Dr. Lebron Conners.  He is NPO.  Past Medical History:  Diagnosis Date  . Hypertension   . Kidney stones   . Pulmonary hypertension ([place])     Past Surgical History:  Procedure Laterality Date  . CARDIAC CATHETERIZATION N/A 01/18/2016   Procedure: Right Heart Cath;  Surgeon: Jolaine Artist, MD;  Location: [place] [facility];  Service: Cardiovascular;  Laterality: N/A;  . CARDIAC CATHETERIZATION N/A 06/24/2016   Procedure: Right Heart Cath;  Surgeon: Jolaine Artist, MD;  Location: [place] [facility];  Service: Cardiovascular;  Laterality: N/A;  . KIDNEY SURGERY    . VIDEO ASSISTED THORACOSCOPY (VATS)/DECORTICATION Left 2003  . VIDEO ASSISTED THORACOSCOPY (VATS)/DECORTICATION Left 10/02/2015   Procedure: VIDEO ASSISTED THORACOSCOPY (VATS)/DECORTICATION and drainage of chronic empyena;  Surgeon: [name], MD;  Location: [place];  Service: Thoracic;  Laterality: Left;  Marland Kitchen VIDEO BRONCHOSCOPY N/A 10/02/2015   Procedure: VIDEO BRONCHOSCOPY;  Surgeon: [name], MD;  Location: [facility] OR;  Service: Thoracic;  Laterality: N/A;    Allergies: Patient has no known allergies.  Medications: Prior to Admission medications   Medication Sig Start Date End Date Taking? Authorizing Provider  acyclovir (ZOVIRAX) 400 MG tablet Take 1 tablet (400 mg total) by mouth 2 (two) times daily. 11/08/17  Yes Brunetta Genera, MD  albuterol (PROVENTIL HFA;VENTOLIN HFA) 108 (90 Base) MCG/ACT inhaler Inhale 2 puffs into the lungs every 6 (six) hours as needed for wheezing or shortness of breath. 07/28/17  Yes Tanda Rockers, MD  aspirin EC 325 MG EC tablet Take 1 tablet (325 mg total) by mouth daily. 11/09/16  Yes Rai, Ripudeep K, MD  ondansetron (ZOFRAN) 8 MG tablet Take 1 tablet (8 mg total) by mouth 2 (two) times daily as needed (Nausea or vomiting). 11/08/17  Yes Brunetta Genera, MD  OXYGEN 2lpm BEDTIME ONLY   Yes [provider]  tadalafil, PAH, (ADCIRCA) 20 MG tablet Take 2 tablets (40 mg total) by mouth daily. 08/26/17  Yes Juanito Doom, MD  traMADol (ULTRAM) 50 MG tablet Take 1-2 tablets (50-100 mg total) by mouth every 6 (six) hours as needed. 12/04/17  Yes Brunetta Genera, MD  dexamethasone (DECADRON) 4 MG tablet Take 5 tablets (20 mg total) by mouth once a week. 11/14/17   Brunetta Genera, MD  umeclidinium-vilanterol [facility] ELLIPTA) 62.5-25 MCG/INH AEPB Inhale 1 puff into the lungs daily. 08/26/17   Juanito Doom, MD     Family History  Problem Relation Age of Onset  . Hypertension Other     Social History   Socioeconomic History  . Marital status: Single    Spouse name: None  . Number of children: None  . Years of education: None  . Highest education level: None  Social Needs  . Financial resource strain: None  . Food insecurity - worry: None  .  Food insecurity - inability: None  . Transportation needs - medical: None  . Transportation needs - non-medical: None  Occupational History  . None  Tobacco Use  . Smoking status: Never Smoker  . Smokeless tobacco: Never Used  Substance and Sexual Activity  . Alcohol use: Yes  . Drug use: No  . Sexual activity: None  Other Topics Concern  . None  Social History Narrative  . None    Review of Systems: A 12 point ROS discussedReview of Systems  Constitutional: Negative.   HENT: Negative.     Respiratory: Negative.   Cardiovascular: Negative.   Gastrointestinal: Negative.   Genitourinary: Negative.   Musculoskeletal: Negative.   Skin: Negative.   Neurological: Negative.   Hematological: Negative.   Psychiatric/Behavioral: Negative.     Vital Signs: BP 126/74 (BP Location: Right Arm)   Pulse (!) 56   Temp 98.5 F (36.9 C) (Oral)   Resp 18   SpO2 94%   Physical Exam  Constitutional: He is oriented to person, place, and time. He appears well-developed.  HENT:  Head: Normocephalic and atraumatic.  Eyes: EOM are normal.  Neck: Normal range of motion.  Cardiovascular: Normal rate, regular rhythm and normal heart sounds.  Pulmonary/Chest: Effort normal and breath sounds normal. No respiratory distress.  Abdominal: He exhibits no distension.  Musculoskeletal: Normal range of motion.  Neurological: He is alert and oriented to person, place, and time.  Skin: Skin is warm and dry.  Psychiatric: He has a normal mood and affect. His behavior is normal. Judgment and thought content normal.  Vitals reviewed.   Imaging: Ct Abdomen Pelvis Wo Contrast  Result Date: 12/23/2017 CLINICAL DATA:  Left-sided flank pain for 1 year EXAM: CT ABDOMEN AND PELVIS WITHOUT CONTRAST TECHNIQUE: Multidetector CT imaging of the abdomen and pelvis was performed following the standard protocol without IV contrast. COMPARISON:  10/21/2017 FINDINGS: Lower chest: Chronic changes in the left lung base are noted consistent with the known history of empyema. An irregular fluid collection is noted inferiorly stable from the prior PET-CT. Multiple calcified pleural plaques are again seen as are chronic scarring changes in the right base. Hepatobiliary: No focal liver abnormality is seen. No gallstones, gallbladder wall thickening, or biliary dilatation. Pancreas: Unremarkable. No pancreatic ductal dilatation or surrounding inflammatory changes. Spleen: Chronic calcifications are noted along the lateral aspect  of the spleen. Adrenals/Urinary Tract: Adrenal glands are within normal limits. A hypodense lesion is noted in the left kidney likely representing a cyst. Similar but smaller hypodense lesion is noted within the right kidney. No renal calculi are noted. No obstructive changes are seen. The bladder is decompressed. Stomach/Bowel: Stomach is within normal limits. Appendix appears normal. No evidence of bowel wall thickening, distention, or inflammatory changes. Vascular/Lymphatic: Aortic atherosclerosis. No enlarged abdominal or pelvic lymph nodes. Reproductive: Prostate is unremarkable. Other: Minimal amount of free pelvic fluid is noted likely physiologic in nature. No free air is noted. No hernia is seen. Musculoskeletal: Lytic lesion is again noted in the left half of the L4 vertebral body similar to that noted on prior PET-CT. Degenerative changes of lumbar spine are noted. IMPRESSION: Chronic changes consistent with left empyema. 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 Bilateral renal cystic change stable from the previous exam. Stable lytic lesion and L4. No acute abnormality is noted. Electronically Signed   By: Inez Catalina M.D.   On: 12/23/2017 08:55   Dg Chest 2 View  Result Date: 12/11/2017 CLINICAL DATA:  Left pleural effusion.  Multiple myeloma EXAM: CHEST  2 VIEW COMPARISON:  06/12/2017 FINDINGS: Chronic left pleural thickening unchanged. Mild left lower lobe airspace disease unchanged. Calcified pleural plaque on the right unchanged. Mild right pleural thickening. Heart size normal. Pulmonary artery enlargement compatible with pulmonary artery hypertension which appears severe. Negative for heart failure or edema. Negative for pneumonia IMPRESSION: Chronic pleural thickening bilaterally left greater than right is stable Marked pulmonary hypertension unchanged. Electronically Signed   By: Franchot Gallo M.D.   On: 12/11/2017 09:35    Labs:  CBC: Recent  Labs    09/29/17 1216 11/03/17 0728 11/07/17 1014  12/22/17 1200 12/25/17 1316 01/01/18 1306 01/02/18 0705  WBC 5.5 5.6 7.4   < > 6.4 7.1 9.1 11.4*  HGB 12.2* 13.3 13.1  --   --   --   --  11.3*  HCT 36.7* 38.9* 39.7   < > 33.8* 36.8* 33.2* 32.4*  PLT 286 297 320   < > 628* 511* 133* PENDING   < > = values in this interval not displayed.    COAGS: Recent Labs    11/03/17 0728 01/02/18 0705  INR 1.05 1.00  APTT 32  --     BMP: Recent Labs    12/08/17 1256 12/22/17 1200 12/25/17 1316 01/01/18 1306  NA 134* 132* 134* 131*  K 4.8 4.1 4.2 4.2  CL 98 99 100 100  CO2 _0 GLUCOSE 80 107 84 86  BUN 6* 11 8 6*  CALCIUM 8.6 8.6 8.4 7.9*  CREATININE 0.80 0.95 0.81 0.76  GFRNONAA >60 >60 >60 >60  GFRAA >60 >60 >60 >60    LIVER FUNCTION TESTS: Recent Labs    12/08/17 1256 12/22/17 1200 12/25/17 1316 01/01/18 1306  BILITOT 0.2 0.2 0.2 0.3  AST 49* 29 25 32  ALT _1 ALKPHOS 92 82 79 84  PROT 8.5* 7.5 7.3 6.6  ALBUMIN 2.9* 2.5* 2.6* 2.5*    TUMOR MARKERS: No results for input(s): AFPTM, CEA, CA199, CHROMGRNA in the last 8760 hours.  Assessment and Plan:  Multiple Myeloma  Will proceed with image guided bone marrow biopsy by Dr. Kathlene Cote.  Interpreter was used today.  Risks and benefits discussed with the patient including, but not limited to bleeding, infection, damage to adjacent structures or low yield requiring additional tests.  He is familiar with the procedure and had no questions.  He is agreeable to proceed. Consent signed and in chart.  Thank you for this interesting consult.  I greatly enjoyed meeting Niels Cranshaw and look forward to participating in their care.  A copy of this report was sent to the requesting provider on this date.  Electronically Signed: Murrell Redden, PA-C 01/02/2018, 7:58 AM   I spent a total of  25 Minutes in face to face in clinical consultation, greater than 50% of which was counseling/coordinating care  for bone marrow biopsy.

## 2018-01-02 NOTE — Discharge Instructions (Signed)
You may remove dressing and bathe in 24 hours.    Needle Biopsy of the Bone, Care After Refer to this sheet in the next few weeks. These instructions provide you with information about caring for yourself after your procedure. Your health care provider may also give you more specific instructions. Your treatment has been planned according to current medical practices, but problems sometimes occur. Call your health care provider if you have any problems or questions after your procedure. What can I expect after the procedure? After your procedure, it is common to have soreness or tenderness at the puncture site. Follow these instructions at home:  Take over-the-counter and prescription medicines only as told by your health care provider.  Bathe and shower as told by your health care provider.  Follow instructions from your health care provider about: ? How to take care of your puncture site. ? When and how you should change your bandage (dressing). ? When you should remove your dressing.  Check your puncture site every day for signs of infection. Watch for: ? Redness, swelling, or worsening pain. ? Fluid, blood, or pus.  Return to your normal activities as told by your health care provider.  Keep all follow-up visits as told by your health care provider. This is important. Contact a health care provider if:  You have redness, swelling, or worsening pain at the site of your puncture.  You have fluid, blood, or pus coming from your puncture site.  You have a fever.  You have persistent nausea or vomiting. Get help right away if:  You develop a rash.  You have difficulty breathing. This information is not intended to replace advice given to you by your health care provider. Make sure you discuss any questions you have with your health care provider. Document Released: 05/10/2005 Document Revised: 03/28/2016 Document Reviewed: 11/28/2014 Elsevier Interactive Patient Education   2018 [place].     Moderate Conscious Sedation, Adult, Care After These instructions provide you with information about caring for yourself after your procedure. Your health care provider may also give you more specific instructions. Your treatment has been planned according to current medical practices, but problems sometimes occur. Call your health care provider if you have any problems or questions after your procedure. What can I expect after the procedure? After your procedure, it is common:  To feel sleepy for several hours.  To feel clumsy and have poor balance for several hours.  To have poor judgment for several hours.  To vomit if you eat too soon.  Follow these instructions at home: For at least 24 hours after the procedure:   Do not: ? Participate in activities where you could fall or become injured. ? Drive. ? Use heavy machinery. ? Drink alcohol. ? Take sleeping pills or medicines that cause drowsiness. ? Make important decisions or sign legal documents. ? Take care of children on your own.  Rest. Eating and drinking  Follow the diet recommended by your health care provider.  If you vomit: ? Drink water, juice, or soup when you can drink without vomiting. ? Make sure you have little or no nausea before eating solid foods. General instructions  Have a responsible adult stay with you until you are awake and alert.  Take over-the-counter and prescription medicines only as told by your health care provider.  If you smoke, do not smoke without supervision.  Keep all follow-up visits as told by your health care provider. This is important. Contact a health  care provider if:  You keep feeling nauseous or you keep vomiting.  You feel light-headed.  You develop a rash.  You have a fever. Get help right away if:  You have trouble breathing. This information is not intended to replace advice given to you by your health care provider. Make sure you  discuss any questions you have with your health care provider. Document Released: 08/11/2013 Document Revised: 03/25/2016 Document Reviewed: 02/10/2016 Elsevier Interactive Patient Education  [employer].

## 2018-01-02 NOTE — Procedures (Signed)
Interventional Radiology Procedure Note  Procedure: CT guided bone marrow aspiration and biopsy  Complications: None  EBL: < 10 mL  Findings: Aspirate and core biopsy performed of bone marrow in right iliac bone.  Plan: Bedrest supine x 1 hrs  Jia Dottavio T. Quorra Rosene, M.D Pager:  [phone]   

## 2018-01-03 NOTE — Assessment & Plan Note (Signed)
57 y.o.  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  Based on the diagnosis, patient was started on treatment with lenalidomide, bortezomib, low-dose dexamethasone and is currently undergoing cycle 2 day 8 of therapy.  Tolerating treatment well without significant side effects at this time.  Clinical evaluation lab workup permissive to continue treated as scheduled.  Plan: -Proceed with lenalidomide, bortezomib and dexamethasone to complete the cycle. - Return to clinic on 12/22/17 with labs, clinic visit, and possible initiation of the third cycle of systemic therapy  -Disease assessment with repeat bone marrow biopsy and PET/CT after 3 cycles of therapy.  If complete response is observed, patient will be referred for possible consolidative therapy with high-dose chemotherapy/autologous stem cell rescue at [facility].  If significant amount of residual disease is detected, patient will continue with additional 3 cycles of therapy to complete full 6 cycles of treatment with repeat disease assessment at that time.

## 2018-01-03 NOTE — Progress Notes (Signed)
[facility] Follow-up Visit:  Assessment: Multiple myeloma ([place]) 57 y.o.  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  Based on the diagnosis, patient was started on treatment with lenalidomide, bortezomib, low-dose dexamethasone and is currently undergoing cycle 2 day 8 of therapy.  Tolerating treatment well without significant side effects at this time.  Clinical evaluation lab workup permissive to continue treated as scheduled.  Plan: -Proceed with lenalidomide, bortezomib and dexamethasone to complete the cycle. - Return to clinic on 12/22/17 with labs, clinic visit, and possible initiation of the third cycle of systemic therapy  -Disease assessment with repeat bone marrow biopsy and PET/CT after 3 cycles of therapy.  If complete response is observed, patient will be referred for possible consolidative therapy with high-dose chemotherapy/autologous stem cell rescue at [facility].  If significant amount of residual disease is detected, patient will continue with additional 3 cycles of therapy to complete full 6 cycles of treatment with repeat disease assessment at that time.  Voice recognition software was used and creation of this note. Despite my best effort at editing the text, some misspelling/errors may have occurred.  Orders Placed This Encounter  Procedures  . CBC with Differential (Cancer Center Only)    Standing Status:   Future    Number of Occurrences:   1    Standing Expiration Date:   12/08/2018  . CMP ([place] only)    Standing Status:   Future    Number of Occurrences:   1    Standing Expiration Date:   12/08/2018  . Lactate dehydrogenase (LDH)    Standing Status:   Future    Number of Occurrences:   1    Standing Expiration Date:    12/08/2018  . Magnesium    Standing Status:   Future    Number of Occurrences:   1    Standing Expiration Date:   12/08/2018  . Phosphorus    Standing Status:   Future    Number of Occurrences:   1    Standing Expiration Date:   12/08/2018  . Beta 2 microglobulin    Standing Status:   Future    Number of Occurrences:   1    Standing Expiration Date:   12/08/2018  . Multiple Myeloma Panel (SPEP&IFE w/QIG)    Standing Status:   Future    Number of Occurrences:   1    Standing Expiration Date:   12/08/2018  . Kappa/lambda light chains    Standing Status:   Future    Number of Occurrences:   1    Standing Expiration Date:   12/08/2018    Cancer Staging Multiple myeloma ([place]) Staging form: Plasma Cell Myeloma and Plasma Cell Disorders, AJCC 8th Edition - Clinical stage from 11/20/2017: RISS Stage II (Beta-2-microglobulin (mg/L): 2.1, Albumin (g/dL): 2.7, ISS: Stage II, High-risk cytogenetics: Absent, LDH: Normal) - Signed by Ardath Sax, MD on 12/09/2017   All questions were answered.  . The patient knows to call the clinic with any problems, questions or concerns.  This note was electronically signed.    History of Presenting Illness Eswin Minus is a 57 y.o. male followed in the [facility] for monoclonal Gammopathy of unknown significance. Patient's past medical history is significant for recurrent left empyema  and pulmonary hypertension. Patient has been treated with VATS/decortication of the left lung.Also has history or heterozygous alpha-1 Anti-trypsin deficiency, MZ phenotype, COPD.  Patient returns to the clinic for continued therapy with lenalidomide, bortezomib, low-dose dexamethasone regimen.  In the interim, patient denies any new complaints.  Currently, he is on cycle 2 day 8 of the treatment.  Patient denies interval fever, chills, night sweats.  No new or progressive neuropathy.  No new musculoskeletal complaints.  No skin rash.  Oncological/hematological History: --ECHO,  07/01/17: left ventricular ejection fraction 60 to 65%, normal Left ventricular wall thickness, impaired relaxation. --Labs, 08/26/17: SPEP -- restricted band in gamma globulins, suggestive of possible medical protein.    Multiple myeloma ([place])   09/29/2017 Tumor Marker    tProt 10.0, Alb 2.7, Ca 8.9, Cr 0.9, AP 75 LDH 169 SPEP -- M-Spike 3.4g/dL, SIFE -- IgG kappa;  IgG 4555, IgA 142, IgM 32; kappa 74.2, lambda 15.8, KLR 4.70;  WBC 5.2, Hgb 12.2, Plt 286      10/21/2017 PET scan    Hypermetabolic lytic lesion measuring 1.5 cm in L4. otherwise, no significant hypermetabolic skeletal disease.      11/03/2017 Bone Marrow Biopsy    Pathology: SLIGHTLY HYPERCELLULAR BONE MARROW FOR AGE WITH PLASMA CELL NEOPLASM. Peripheral blood RED BLOOD CELLS WITH ROULEAUX FORMATION. --The aspirate material is suboptimal with lack of bone marrow particles for evaluation. Touch imprints and core biopsy show a slightly hypercellular bone marrow for age with increased number of atypical plasma cells representing 24% of all cells on touch imprints and associated with interstitial infiltrates and small clusters in the core biopsy. This is seen in a background of a mixture of myeloid cell types. Immunohistochemical stains highlight the increased plasma cell component in the bone marrow, which shows kappa light chain restriction consistent with plasma cell neoplasm. 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 --Cycle #2, 12/01/17:       11/24/2017 Tumor Marker    tProt 9.6, Alb 3.0, Ca 8.7, Cr 0.8, AP 107, AST 521, ALT 26 LDH 255, bete-2 microglobulin 1.9 SPEP -- MSpike 2.5g/dL IgG  3199, IgA 111, IgM 36; kappa 39.2, lambda 15.8, KLR 4.84; WBC 8.5, Hgb 14.4, Plt 120        Medical History: Past Medical History:  Diagnosis Date  . Hypertension   . Kidney stones   . [place]monary [place] ([place]   Surgical History: Past Surgical History:  Procedure Laterality Date  . CARDIAC CATHETERIZATION N/A 01/18/2016   Procedure: Right Heart Cath;  Surgeon: DanJolaine ArtistD;  Location: [facility];  Service: Cardiovascular;  Laterality: N/A;  . CARDIAC CATHETERIZATION N/A 06/24/2016   Procedure: Right Heart Cath;  Surgeon: DanJolaine ArtistD;  Location: [name];  Service: Cardiovascular;  Laterality: N/A;  . KIDNEY SURGERY    . VIDEO ASSISTED THORACOSCOPY (VATS)/DECORTICATION Left 2003  . VIDEO ASSISTED THORACOSCOPY (VATS)/DECORTICATION Left 10/02/2015   Procedure: VIDEO ASSISTED THORACOSCOPY (VATS)/DECORTICATION and drainage of chronic empyena;  Surgeon: EdwGrace IsaacD;  [facility];  Laterality: Left;  . VMarland KitchenDEO BRONCHOSCOPY N/A 10/02/2015   Procedure: VIDEO BRONCHOSCOPY;  Surgeon: [name], MD;  Location[facility];  Service: Thoracic;  Laterality: N/A;    Family History: Family History  Problem Relation Age of Onset  . Hypertension Other     Social History: Social History   Socioeconomic History  . Marital status: Single    Spouse name: Not on file  . Number of children: Not on file  . Years of education: Not on file  . Highest education level: Not on file  Social Needs  . Financial resource strain: Not on file  . Food insecurity - worry: Not on file  . Food insecurity - inability: Not on file  . Transportation needs - medical: Not on file  . Transportation needs - non-medical: Not on file  Occupational History  . Not on file  Tobacco Use  . Smoking status: Never Smoker  . Smokeless tobacco: Never Used  Substance and Sexual Activity  . Alcohol use: Yes  . Drug use: No  . Sexual  activity: Not on file  Other Topics Concern  . Not on file  Social History Narrative  . Not on file    Allergies: No Known Allergies  Medications:  Current Outpatient Medications  Medication Sig Dispense Refill  . acyclovir (ZOVIRAX) 400 MG tablet Take 1 tablet (400 mg total) by mouth 2 (two) times daily. 60 tablet 3  . albuterol (PROVENTIL HFA;VENTOLIN HFA) 108 (90 Base) MCG/ACT inhaler Inhale 2 puffs into the lungs every 6 (six) hours as needed for wheezing or shortness of breath. 1 Inhaler 2  . aspirin EC 325 MG EC tablet Take 1 tablet (325 mg total) by mouth daily. 30 tablet 3  . dexamethasone (DECADRON) 4 MG tablet Take 5 tablets (20 mg total) by mouth once a week. 20 tablet 3  . ondansetron (ZOFRAN) 8 MG tablet Take 1 tablet (8 mg total) by mouth 2 (two) times daily as needed (Nausea or vomiting). 30 tablet 1  . OXYGEN 2lpm BEDTIME ONLY    . tadalafil, PAH, (ADCIRCA) 20 MG tablet Take 2 tablets (40 mg total) by mouth daily. 60 tablet 11  . traMADol (ULTRAM) 50 MG tablet Take 1-2 tablets (50-100 mg total) by mouth every 6 (six) hours as needed. 60 tablet 0  . umeclidinium-vilanterol (ANORO ELLIPTA) 62.5-25 MCG/INH AEPB Inhale 1 puff into the lungs daily. 60 each 5   No current facility-administered medications for this visit.     Review of Systems: Review of Systems  All other systems reviewed and are negative.    PHYSICAL EXAMINATION Blood pressure 126/76, pulse 88, temperature 97.9 F (36.6 C), temperature source Oral, resp. rate 18, height 5' (1.524 m), weight 91 lb 12.8 oz (41.6 kg), SpO2 94 %.  ECOG PERFORMANCE STATUS: 1 - Symptomatic but completely ambulatory  Physical Exam  Constitutional: He is oriented to person, place, and time and well-developed, well-nourished, and in no distress. No distress.  HENT:  Head: Normocephalic and atraumatic.  Mouth/Throat: Oropharynx is clear and moist. No oropharyngeal exudate.  Eyes: Conjunctivae are normal. Pupils are equal,  round, and reactive to light. No scleral icterus.  Neck: No thyromegaly present.  Cardiovascular: Normal rate, regular rhythm and normal heart sounds.  No murmur heard. Pulmonary/Chest:  Decreased breath sounds in the lower portion of the left lung.  Normal breath sounds on the right.  Abdominal: Soft. Bowel sounds are normal. He exhibits no distension. There is no tenderness. There is no rebound.  Musculoskeletal: He exhibits no edema.  Lymphadenopathy:    He  has no cervical adenopathy.  Neurological: He is alert and oriented to person, place, and time. He has normal reflexes. No cranial nerve deficit.  Skin: Skin is warm and dry. No rash noted. He is not diaphoretic. No erythema.     LABORATORY DATA: I have personally reviewed the data as listed: Appointment on 12/08/2017  Component Date Value Ref Range Status  . Sodium 12/08/2017 134* 136 - 145 mmol/L Final  . Potassium 12/08/2017 4.8  3.5 - 5.1 mmol/L Final  . Chloride 12/08/2017 98  98 - 109 mmol/L Final  . CO2 12/08/2017 24  22 - 29 mmol/L Final  . Glucose, Bld 12/08/2017 80  70 - 140 mg/dL Final  . BUN 12/08/2017 6* 7 - 26 mg/dL Final  . Creatinine 12/08/2017 0.80  0.70 - 1.30 mg/dL Final  . Calcium 12/08/2017 8.6  8.4 - 10.4 mg/dL Final  . Total Protein 12/08/2017 8.5* 6.4 - 8.3 g/dL Final  . Albumin 12/08/2017 2.9* 3.5 - 5.0 g/dL Final  . AST 12/08/2017 49* 5 - 34 U/L Final  . ALT 12/08/2017 16  0 - 55 U/L Final  . Alkaline Phosphatase 12/08/2017 92  40 - 150 U/L Final  . Total Bilirubin 12/08/2017 0.2  0.2 - 1.2 mg/dL Final  . GFR, Est Non Af Am 12/08/2017 >60  >60 mL/min Final  . GFR, Est AFR Am 12/08/2017 >60  >60 mL/min Final   Comment: (NOTE) The eGFR has been calculated using the CKD EPI equation. This calculation has not been validated in all clinical situations. eGFR's persistently <60 mL/min signify possible Chronic Kidney Disease.   Georgiann Hahn gap 12/08/2017 12* 3 - 11 Final   Performed at [facility], [place] [address], [place], [place] [address]  . WBC Count 12/08/2017 7.1  4.0 - 10.3 K/uL Final  . RBC 12/08/2017 4.64  4.20 - 5.82 MIL/uL Final  . Hemoglobin 12/08/2017 13.1  13.0 - 17.1 g/dL Final  . HCT 12/08/2017 38.5  38.4 - 49.9 % Final  . MCV 12/08/2017 83.0  79.3 - 98.0 fL Final  . MCH 12/08/2017 28.2  27.2 - 33.4 pg Final  . MCHC 12/08/2017 34.0  32.0 - 36.0 g/dL Final  . RDW 12/08/2017 13.4  11.0 - 14.6 % Final  . Platelet Count 12/08/2017 182  140 - 400 K/uL Final  . Neutrophils Relative % 12/08/2017 86  % Final  . Neutro Abs 12/08/2017 6.0  1.5 - 6.5 K/uL Final  . Lymphocytes Relative 12/08/2017 11  % Final  . Lymphs Abs 12/08/2017 0.8* 0.9 - 3.3 K/uL Final  . Monocytes Relative 12/08/2017 3  % Final  . Monocytes Absolute 12/08/2017 0.2  0.1 - 0.9 K/uL Final  . Eosinophils Relative 12/08/2017 0  % Final  . Eosinophils Absolute 12/08/2017 0.0  0.0 - 0.5 K/uL Final  . Basophils Relative 12/08/2017 0  % Final  . Basophils Absolute 12/08/2017 0.0  0.0 - 0.1 K/uL Final   Performed at [facility], [place] [address], [place], [place] [address]       Ardath Sax, MD

## 2018-01-04 NOTE — Progress Notes (Signed)
[name] Follow-up Visit:  Assessment: Multiple myeloma ([place]) 57 y.o.  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  Based on the diagnosis, patient was started on treatment with lenalidomide, bortezomib, low-dose dexamethasone and is currently undergoing cycle 2 day 8 of therapy.  Tolerating treatment well without significant side effects at this time.  Clinical evaluation lab workup permissive to continue treated as scheduled.  Patient's recent symptoms of the left CVA tenderness and pain radiating down into the left groin are consistent with possible nephrolithiasis.  Patient is presently afebrile so I do not suspect a urinary tract infection at this point in time.  Plan: -Additional lab work as outlined below. -CT of the abdomen and pelvis without contrast to evaluate for nephrolithiasis -Proceed with lenalidomide, bortezomib and dexamethasone, cycle #3 without dose change -Consult interventional radiology for bone marrow biopsy prior to the next visit in the clinic to assess response to therapy. -Return to clinic in 3 weeks: Labs, clinic visit to review lab work, bone marrow biopsy results, possible continuation of systemic therapy.  If good response is observed in the bone marrow biopsy, will make referral [facility] for possible consolidation with high-dose chemotherapy and autologous stem cell rescue.  Voice recognition software was used and creation of this note. Despite my best effort at editing the text, some misspelling/errors may have occurred.  Orders Placed This Encounter  Procedures  . Urine Culture    Standing Status:   Future    Number of Occurrences:   1    Standing Expiration Date:   12/22/2018  . CT  Abdomen Pelvis Wo Contrast    Standing Status:   Future    Number of Occurrences:   1    Standing Expiration Date:   12/22/2018    Order Specific Question:   Preferred imaging location?    Answer:   [facility]    Order Specific Question:   Radiology Contrast Protocol - do NOT remove file path    Answer:   \\[email or web address]\CTProtocols.pdf    Order Specific Question:   Reason for Exam additional comments    Answer:   Left flank pain, please eval for renal stone/hydronephrosis presence  . CT Biopsy    Standing Status:   Future    Number of Occurrences:   1    Standing Expiration Date:   12/22/2018    Order Specific Question:   Lab orders requested (DO NOT place separate lab orders, these will be automatically ordered during procedure specimen collection):    Answer:   Cytology - Non Pap    Comments:   Cytogenetics, MM FISH, molecular studies    Order Specific Question:   Lab orders requested (DO NOT place separate lab orders, these will be automatically ordered during procedure specimen collection):    Answer:   Surgical Pathology    Order Specific Question:   Lab orders requested (DO NOT place separate lab orders, these will be automatically ordered during procedure specimen collection):    Answer:   Other    Order Specific Question:   Reason for Exam (SYMPTOM  OR DIAGNOSIS REQUIRED)    Answer:   Multiple myeloma -- s/p 3 cycles of therapy, please eval respionse    Order Specific Question:  Preferred imaging location?    Answer:   [facility]    Order Specific Question:   Radiology Contrast Protocol - do NOT remove file path    Answer:   \\[email or web address]\CTProtocols.pdf  . CT BONE MARROW BIOPSY & ASPIRATION    Standing Status:   Future    Number of Occurrences:   1    Standing Expiration Date:   03/22/2019    Order Specific Question:   Reason for Exam (SYMPTOM  OR DIAGNOSIS REQUIRED)    Answer:   Multiple myeloma -- s/p 3 cycles of therapy,  please eval respionse    Order Specific Question:   Preferred imaging location?    Answer:   [facility]    Order Specific Question:   Radiology Contrast Protocol - do NOT remove file path    Answer:   \\[email or web address]\Radiant\CTProtocols.pdf  . Urinalysis, Complete w Microscopic    Standing Status:   Future    Number of Occurrences:   1    Standing Expiration Date:   12/22/2018  . CBC with Differential (Cancer Center Only)    Standing Status:   Future    Standing Expiration Date:   12/22/2018  . CMP ([facility] only)    Standing Status:   Future    Standing Expiration Date:   12/22/2018  . Lactate dehydrogenase (LDH)    Standing Status:   Future    Standing Expiration Date:   12/22/2018  . Beta 2 microglobulin    Standing Status:   Future    Standing Expiration Date:   12/22/2018  . Multiple Myeloma Panel (SPEP&IFE w/QIG)    Standing Status:   Future    Standing Expiration Date:   12/22/2018  . 24-Hr Ur UPEP/UIFE/Light Chains/TP    Standing Status:   Future    Standing Expiration Date:   12/22/2018  . Kappa/lambda light chains    Standing Status:   Future    Standing Expiration Date:   12/22/2018    Cancer Staging Multiple myeloma ([place]) Staging form: Plasma Cell Myeloma and Plasma Cell Disorders, AJCC 8th Edition - Clinical stage from 11/20/2017: RISS Stage II (Beta-2-microglobulin (mg/L): 2.1, Albumin (g/dL): 2.7, ISS: Stage II, High-risk cytogenetics: Absent, LDH: Normal) - Signed by Ardath Sax, MD on 12/09/2017   All questions were answered.  . The patient knows to call the clinic with any problems, questions or concerns.  This note was electronically signed.    History of Presenting Illness [name] [name] is a 57 y.o. male followed in the [place] for monoclonal Gammopathy of unknown significance. Patient's past medical history is significant for recurrent left empyema and pulmonary hypertension. Patient has been treated with VATS/decortication of the  left lung.Also has history or heterozygous alpha-1 Anti-trypsin deficiency, MZ phenotype, COPD.  Patient returns to the clinic for continued therapy with lenalidomide, bortezomib, low-dose dexamethasone regimen.  In the interim, patient denies any new complaints.  Currently, he is on cycle 3 day 1 of the treatment.  Patient denies interval fever, chills, night sweats.  No new or progressive neuropathy.  Patient reports a period of 2 days last week when he was unable to urinate or pass stool.  He did not contact any of his health care providers with the problems.  Since that time, continues to have pain and tenderness in the left flank and mid back.  No associated nausea, vomiting, or dysuria.  Denies any hematuria.  Presently, reports being able to urinate and pass stool without  difficulties.  Oncological/hematological History: --ECHO, 07/01/17: left ventricular ejection fraction 60 to 65%, normal Left ventricular wall thickness, impaired relaxation. --Labs, 08/26/17: SPEP -- restricted band in gamma globulins, suggestive of possible medical protein.    Multiple myeloma ([place])   09/29/2017 Tumor Marker    tProt 10.0, Alb 2.7, Ca 8.9, Cr 0.9, AP 75 LDH 169 SPEP -- M-Spike 3.4g/dL, SIFE -- IgG kappa;  IgG 4555, IgA 142, IgM 32; kappa 74.2, lambda 15.8, KLR 4.70;  WBC 5.2, Hgb 12.2, Plt 286      10/21/2017 PET scan    Hypermetabolic lytic lesion measuring 1.5 cm in L4. otherwise, no significant hypermetabolic skeletal disease.      11/03/2017 Bone Marrow Biopsy    Pathology: SLIGHTLY HYPERCELLULAR BONE MARROW FOR AGE WITH PLASMA CELL NEOPLASM. Peripheral blood RED BLOOD CELLS WITH ROULEAUX FORMATION. --The aspirate material is suboptimal with lack of bone marrow particles for evaluation. Touch imprints and core biopsy show a slightly hypercellular bone marrow for age with increased number of atypical plasma cells representing 24% of all cells on touch imprints and associated with interstitial  infiltrates and small clusters in the core biopsy. This is seen in a background of a mixture of myeloid cell types. Immunohistochemical stains highlight the increased plasma cell component in the bone marrow, which shows kappa light chain restriction consistent with plasma cell neoplasm. --CytoGen: No clonal cytogenetic abnormalities --FISH: Positive for loss of D13S319 and loss of 13q34      11/07/2017 Initial Diagnosis    Multiple myeloma ([place])      11/10/2017 -  Chemotherapy    Lenalidomide 51m PO Qday, d1-14 + Bortezomib 1.361mm2, d1,4,8,11 + Dexamethasone 406mWk Q21d --Cycle #1, 11/10/17: Therapy started without lenalidomide to assess tolerance. --Cycle #2, 12/01/17: --Cycle #3, 12/22/17:       11/24/2017 Tumor Marker    tProt 9.6, Alb 3.0, Ca 8.7, Cr 0.8, AP 107, AST 521, ALT 26 LDH 255, bete-2 microglobulin 1.9 SPEP -- MSpike 2.5g/dL IgG 3199, IgA 111, IgM 36; kappa 39.2, lambda 15.8, KLR 4.84; WBC 8.5, Hgb 14.4, Plt 120        Medical History: Past Medical History:  Diagnosis Date  . Hypertension   . Kidney stones   . Pulmonary hypertension ([place]   Surgical History: Past Surgical History:  Procedure Laterality Date  . CARDIAC CATHETERIZATION N/A 01/18/2016   Procedure: Right Heart Cath;  Surgeon: DanJolaine ArtistD;  Location: [facility];  Service: Cardiovascular;  Laterality: N/A;  . CARDIAC CATHETERIZATION N/A 06/24/2016   Procedure: Right Heart Cath;  Surgeon: DanJolaine ArtistD;  Location: [facility];  Service: Cardiovascular;  Laterality: N/A;  . KIDNEY SURGERY    . VIDEO ASSISTED THORACOSCOPY (VATS)/DECORTICATION Left 2003  . VIDEO ASSISTED THORACOSCOPY (VATS)/DECORTICATION Left 10/02/2015   Procedure: VIDEO ASSISTED THORACOSCOPY (VATS)/DECORTICATION and drainage of chronic empyena;  Surgeon: EdwGrace IsaacD;  [facility];  Laterality: Left;  . VMarland KitchenDEO BRONCHOSCOPY N/A 10/02/2015   Procedure: VIDEO  BRONCHOSCOPY;  Surgeon: EdwGrace IsaacD;  [facility];  Service: Thoracic;  Laterality: N/A;    Family History: Family History  Problem Relation Age of Onset  . Hypertension Other     Social History: Social History   Socioeconomic History  . Marital status: Single    Spouse name: Not on file  . Number of children: Not on file  . Years of education: Not on file  . Highest education  level: Not on file  Social Needs  . Financial resource strain: Not on file  . Food insecurity - worry: Not on file  . Food insecurity - inability: Not on file  . Transportation needs - medical: Not on file  . Transportation needs - non-medical: Not on file  Occupational History  . Not on file  Tobacco Use  . Smoking status: Never Smoker  . Smokeless tobacco: Never Used  Substance and Sexual Activity  . Alcohol use: Yes  . Drug use: No  . Sexual activity: Not on file  Other Topics Concern  . Not on file  Social History Narrative  . Not on file    Allergies: No Known Allergies  Medications:  Current Outpatient Medications  Medication Sig Dispense Refill  . acyclovir (ZOVIRAX) 400 MG tablet Take 1 tablet (400 mg total) by mouth 2 (two) times daily. 60 tablet 3  . albuterol (PROVENTIL HFA;VENTOLIN HFA) 108 (90 Base) MCG/ACT inhaler Inhale 2 puffs into the lungs every 6 (six) hours as needed for wheezing or shortness of breath. 1 Inhaler 2  . aspirin EC 325 MG EC tablet Take 1 tablet (325 mg total) by mouth daily. 30 tablet 3  . dexamethasone (DECADRON) 4 MG tablet Take 5 tablets (20 mg total) by mouth once a week. 20 tablet 3  . ondansetron (ZOFRAN) 8 MG tablet Take 1 tablet (8 mg total) by mouth 2 (two) times daily as needed (Nausea or vomiting). 30 tablet 1  . OXYGEN 2lpm BEDTIME ONLY    . tadalafil, PAH, (ADCIRCA) 20 MG tablet Take 2 tablets (40 mg total) by mouth daily. 60 tablet 11  . traMADol (ULTRAM) 50 MG tablet Take 1-2 tablets (50-100 mg total) by mouth every 6 (six) hours  as needed. 60 tablet 0  . umeclidinium-vilanterol (ANORO ELLIPTA) 62.5-25 MCG/INH AEPB Inhale 1 puff into the lungs daily. 60 each 5   No current facility-administered medications for this visit.     Review of Systems: Review of Systems  Gastrointestinal: Positive for constipation.  Genitourinary: Positive for difficulty urinating.   Musculoskeletal: Positive for flank pain.  All other systems reviewed and are negative.    PHYSICAL EXAMINATION Blood pressure 110/75, pulse 76, temperature 98.6 F (37 C), temperature source Oral, resp. rate 18, height 5' (1.524 m), weight 91 lb (41.3 kg), SpO2 94 %.  ECOG PERFORMANCE STATUS: 1 - Symptomatic but completely ambulatory  Physical Exam  Constitutional: He is oriented to person, place, and time and well-developed, well-nourished, and in no distress. No distress.  HENT:  Head: Normocephalic and atraumatic.  Mouth/Throat: Oropharynx is clear and moist. No oropharyngeal exudate.  Eyes: Conjunctivae are normal. Pupils are equal, round, and reactive to light. No scleral icterus.  Neck: No thyromegaly present.  Cardiovascular: Normal rate, regular rhythm and normal heart sounds.  No murmur heard. Pulmonary/Chest:  Decreased breath sounds in the lower portion of the left lung.  Normal breath sounds on the right.  Abdominal: Soft. Bowel sounds are normal. He exhibits no distension. There is no tenderness. There is no rebound.  Musculoskeletal: He exhibits no edema.  Lymphadenopathy:    He has no cervical adenopathy.  Neurological: He is alert and oriented to person, place, and time. He has normal reflexes. No cranial nerve deficit.  Skin: Skin is warm and dry. No rash noted. He is not diaphoretic. No erythema.     LABORATORY DATA: I have personally reviewed the data as listed: Appointment on 12/22/2017  Component Date Value Ref  Range Status  . Specimen Description 12/22/2017    Final                   Value:URINE, CLEAN CATCH Performed  at [facility], [place] [address] [place], [place] [address]   . Special Requests 12/22/2017    Final                   Value:NONE Performed at [facility], [place] [address], [place], [place] [address]   . Culture 12/22/2017    Final                   Value:NO GROWTH Performed at [facility], [place] [address], [place], [place] [address]   . Report Status 12/22/2017 12/24/2017 FINAL   Final  . Color, Urine 12/22/2017 YELLOW  YELLOW Final  . APPearance 12/22/2017 CLEAR  CLEAR Final  . Specific Gravity, Urine 12/22/2017 1.011  1.005 - 1.030 Final  . pH 12/22/2017 8.0  5.0 - 8.0 Final  . Glucose, UA 12/22/2017 NEGATIVE  NEGATIVE mg/dL Final  . Hgb urine dipstick 12/22/2017 NEGATIVE  NEGATIVE Final  . Bilirubin Urine 12/22/2017 NEGATIVE  NEGATIVE Final  . Ketones, ur 12/22/2017 NEGATIVE  NEGATIVE mg/dL Final  . Protein, ur 12/22/2017 NEGATIVE  NEGATIVE mg/dL Final  . Nitrite 12/22/2017 NEGATIVE  NEGATIVE Final  . Leukocytes, UA 12/22/2017 NEGATIVE  NEGATIVE Final  . RBC / HPF 12/22/2017 0-5  0 - 5 RBC/hpf Final  . WBC, UA 12/22/2017 0-5  0 - 5 WBC/hpf Final  . Bacteria, UA 12/22/2017 NONE SEEN  NONE SEEN Final  . Squamous Epithelial / LPF 12/22/2017 NONE SEEN  NONE SEEN Final  . Mucus 12/22/2017 PRESENT   Final   Performed at [facility], [place] [address] [place], [place] [address]  Appointment on 12/22/2017  Component Date Value Ref Range Status  . Kappa free light chain 12/22/2017 28.7* 3.3 - 19.4 mg/L Final  . Lamda free light chains 12/22/2017 9.7  5.7 - 26.3 mg/L Final  . Kappa, lamda light chain ratio 12/22/2017 2.96* 0.26 - 1.65 Final   Comment: (NOTE) Performed At: [facility] [place], [place] [id number] Rush Farmer MD EL:[id number] Performed at [facility], [place] [address], [place], [place] [address]   . IgG (Immunoglobin G), Serum 12/22/2017 2,407*  700 - 1,600 mg/dL Final  . IgA 12/22/2017 48* 90 - 386 mg/dL Final   Result confirmed on concentration.  . IgM (Immunoglobulin M), Srm 12/22/2017 24  20 - 172 mg/dL Final   Result confirmed on concentration.  . Total Protein ELP 12/22/2017 7.0  6.0 - 8.5 g/dL Corrected  . Albumin SerPl Elph-Mcnc 12/22/2017 2.7* 2.9 - 4.4 g/dL Corrected  . Alpha 1 12/22/2017 0.3  0.0 - 0.4 g/dL Corrected  . Alpha2 Glob SerPl Elph-Mcnc 12/22/2017 0.8  0.4 - 1.0 g/dL Corrected  . B-Globulin SerPl Elph-Mcnc 12/22/2017 0.9  0.7 - 1.3 g/dL Corrected  . Gamma Glob SerPl Elph-Mcnc 12/22/2017 2.3* 0.4 - 1.8 g/dL Corrected  . M Protein SerPl Elph-Mcnc 12/22/2017 1.8* Not Observed g/dL Corrected  . Globulin, Total 12/22/2017 4.3* 2.2 - 3.9 g/dL Corrected  . Albumin/Glob SerPl 12/22/2017 0.7  0.7 - 1.7 Corrected  . IFE 1 12/22/2017 Comment   Corrected   Comment: (NOTE) Immunofixation shows IgG monoclonal protein with kappa light chain specificity. Please note that samples from patients receiving DARZALEX(R) (daratumumab) treatment can  appear as an "IgG kappa" and mask a complete response. If this patient is receiving DARA, this IFE assay interference can be removed by ordering test number [id number]-"Immunofixation, Daratumumab-Specific, Serum" and submitting a new sample for testing or by calling the lab to add this test to the current sample.   . Please Note 12/22/2017 Comment   Corrected   Comment: (NOTE) Protein electrophoresis scan will follow via computer, mail, or courier delivery. Performed At: [facility] [place], [place] [id number] Rush Farmer MD [id number] Performed at [facility], [place] [address] [place], [place] [address]   . Beta-2 Microglobulin 12/22/2017 2.8* 0.6 - 2.4 mg/L Final   Comment: (NOTE) Siemens Immulite 2000 Immunochemiluminometric assay (ICMA) Values obtained with different assay methods or kits cannot be used interchangeably.  Results cannot be interpreted as absolute evidence of the presence or absence of malignant disease. Performed At: [facility] [place], [place] [id number] Rush Farmer MD [id number] Performed at [facility], [place] [address], [place], [place] [address]   . Phosphorus 12/22/2017 1.8* 2.5 - 4.6 mg/dL Final   Performed at [place] [address], [place], [place] [address]  . Magnesium 12/22/2017 2.4  1.5 - 2.5 mg/dL Final   Performed at [facility], [place] [address] [place], [place] [address]  . LDH 12/22/2017 214  125 - 245 U/L Final   Performed at [facility], [place] [address] [place], [place] [address]  . Sodium 12/22/2017 132* 136 - 145 mmol/L Final  . Potassium 12/22/2017 4.1  3.5 - 5.1 mmol/L Final  . Chloride 12/22/2017 99  98 - 109 mmol/L Final  . CO2 12/22/2017 24  22 - 29 mmol/L Final  . Glucose, Bld 12/22/2017 107  70 - 140 mg/dL Final  . BUN 12/22/2017 11  7 - 26 mg/dL Final  . Creatinine 12/22/2017 0.95  0.70 - 1.30 mg/dL Final  . Calcium 12/22/2017 8.6  8.4 - 10.4 mg/dL Final  . Total Protein 12/22/2017 7.5  6.4 - 8.3 g/dL Final  . Albumin 12/22/2017 2.5* 3.5 - 5.0 g/dL Final  . AST 12/22/2017 29  5 - 34 U/L Final  . ALT 12/22/2017 15  0 - 55 U/L Final  . Alkaline Phosphatase 12/22/2017 82  40 - 150 U/L Final  . Total Bilirubin 12/22/2017 0.2  0.2 - 1.2 mg/dL Final  . GFR, Est Non Af Am 12/22/2017 >60  >60 mL/min Final  . GFR, Est AFR Am 12/22/2017 >60  >60 mL/min Final   Comment: (NOTE) The eGFR has been calculated using the CKD EPI equation. This calculation has not been validated in all clinical situations. eGFR's persistently <60 mL/min signify possible Chronic Kidney Disease.   Georgiann Hahn gap 12/22/2017 9  3 - 11 Final   Performed at [facility], [place] [address], [place], [place] [address]  . WBC Count 12/22/2017 6.4   4.0 - 10.3 K/uL Final  . RBC 12/22/2017 3.99* 4.20 - 5.82 MIL/uL Final  . Hemoglobin 12/22/2017 11.1* 13.0 - 17.1 g/dL Final  . HCT 12/22/2017 33.8* 38.4 - 49.9 % Final  . MCV 12/22/2017 84.7  79.3 - 98.0 fL Final  . MCH 12/22/2017 27.7  27.2 - 33.4 pg Final  . MCHC 12/22/2017 32.8  32.0 - 36.0 g/dL Final  . RDW 12/22/2017 14.0  11.0 - 14.6 % Final  . Platelet Count 12/22/2017 628* 140 -  400 K/uL Final  . Neutrophils Relative % 12/22/2017 79  % Final  . Neutro Abs 12/22/2017 5.0  1.5 - 6.5 K/uL Final  . Lymphocytes Relative 12/22/2017 11  % Final  . Lymphs Abs 12/22/2017 0.7* 0.9 - 3.3 K/uL Final  . Monocytes Relative 12/22/2017 9  % Final  . Monocytes Absolute 12/22/2017 0.6  0.1 - 0.9 K/uL Final  . Eosinophils Relative 12/22/2017 0  % Final  . Eosinophils Absolute 12/22/2017 0.0  0.0 - 0.5 K/uL Final  . Basophils Relative 12/22/2017 1  % Final  . Basophils Absolute 12/22/2017 0.0  0.0 - 0.1 K/uL Final   Performed at [facility], [place] [address] [place], [place] [address]       Ardath Sax, MD

## 2018-01-04 NOTE — Assessment & Plan Note (Signed)
57 y.o.  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  Based on the diagnosis, patient was started on treatment with lenalidomide, bortezomib, low-dose dexamethasone and is currently undergoing cycle 2 day 8 of therapy.  Tolerating treatment well without significant side effects at this time.  Clinical evaluation lab workup permissive to continue treated as scheduled.  Patient's recent symptoms of the left CVA tenderness and pain radiating down into the left groin are consistent with possible nephrolithiasis.  Patient is presently afebrile so I do not suspect a urinary tract infection at this point in time.  Plan: -Additional lab work as outlined below. -CT of the abdomen and pelvis without contrast to evaluate for nephrolithiasis -Proceed with lenalidomide, bortezomib and dexamethasone, cycle #3 without dose change -Consult interventional radiology for bone marrow biopsy prior to the next visit in the clinic to assess response to therapy. -Return to clinic in 3 weeks: Labs, clinic visit to review lab work, bone marrow biopsy results, possible continuation of systemic therapy.  If good response is observed in the bone marrow biopsy, will make referral to [facility] for possible consolidation with high-dose chemotherapy and autologous stem cell rescue. 

## 2018-01-08 ENCOUNTER — Encounter (HOSPITAL_COMMUNITY): Payer: Self-pay | Admitting: *Deleted

## 2018-01-08 NOTE — Progress Notes (Signed)
Received disability forms from [employer].  Forms completed and faxed today to [phone].    Original forms will be scanned to patient's electronic medical record.

## 2018-01-12 ENCOUNTER — Ambulatory Visit (HOSPITAL_COMMUNITY)
Admission: RE | Admit: 2018-01-12 | Discharge: 2018-01-12 | Disposition: A | Discharge status: Home / Self Care | Payer: Self-pay | Source: Ambulatory Visit | Attending: Hematology and Oncology | Admitting: Hematology and Oncology

## 2018-01-12 ENCOUNTER — Ambulatory Visit: Payer: Self-pay

## 2018-01-12 ENCOUNTER — Inpatient Hospital Stay: Payer: Self-pay | Attending: Hematology

## 2018-01-12 ENCOUNTER — Inpatient Hospital Stay: Payer: Self-pay

## 2018-01-12 ENCOUNTER — Inpatient Hospital Stay (HOSPITAL_BASED_OUTPATIENT_CLINIC_OR_DEPARTMENT_OTHER): Payer: Self-pay | Admitting: Hematology and Oncology

## 2018-01-12 ENCOUNTER — Encounter: Payer: Self-pay | Admitting: Hematology and Oncology

## 2018-01-12 ENCOUNTER — Telehealth: Payer: Self-pay | Admitting: Hematology and Oncology

## 2018-01-12 VITALS — BP 103/63 | HR 87 | Temp 98.0°F | Resp 20 | Ht 60.0 in | Wt 98.2 lb

## 2018-01-12 DIAGNOSIS — C9000 Multiple myeloma not having achieved remission: Secondary | ICD-10-CM

## 2018-01-12 DIAGNOSIS — R0781 Pleurodynia: Secondary | ICD-10-CM

## 2018-01-12 DIAGNOSIS — R071 Chest pain on breathing: Secondary | ICD-10-CM | POA: Insufficient documentation

## 2018-01-12 DIAGNOSIS — J449 Chronic obstructive pulmonary disease, unspecified: Secondary | ICD-10-CM

## 2018-01-12 DIAGNOSIS — I272 Pulmonary hypertension, unspecified: Secondary | ICD-10-CM

## 2018-01-12 DIAGNOSIS — Z5112 Encounter for antineoplastic immunotherapy: Secondary | ICD-10-CM | POA: Insufficient documentation

## 2018-01-12 DIAGNOSIS — J9811 Atelectasis: Secondary | ICD-10-CM | POA: Insufficient documentation

## 2018-01-12 DIAGNOSIS — I2721 Secondary pulmonary arterial hypertension: Secondary | ICD-10-CM | POA: Insufficient documentation

## 2018-01-12 DIAGNOSIS — Z7189 Other specified counseling: Secondary | ICD-10-CM

## 2018-01-12 LAB — CBC WITH DIFFERENTIAL (CANCER CENTER ONLY)
Basophils Absolute: 0 10*3/uL (ref 0.0–0.1)
Basophils Relative: 1 %
EOS PCT: 5 %
Eosinophils Absolute: 0.5 10*3/uL (ref 0.0–0.5)
HCT: 31.7 % — ABNORMAL LOW (ref 38.4–49.9)
Hemoglobin: 10.2 g/dL — ABNORMAL LOW (ref 13.0–17.1)
LYMPHS PCT: 17 %
Lymphs Abs: 1.7 10*3/uL (ref 0.9–3.3)
MCH: 26.8 pg — AB (ref 27.2–33.4)
MCHC: 32.3 g/dL (ref 32.0–36.0)
MCV: 83 fL (ref 79.3–98.0)
MONO ABS: 1.6 10*3/uL — AB (ref 0.1–0.9)
Monocytes Relative: 17 %
Neutro Abs: 5.8 10*3/uL (ref 1.5–6.5)
Neutrophils Relative %: 60 %
PLATELETS: 644 10*3/uL — AB (ref 140–400)
RBC: 3.81 MIL/uL — AB (ref 4.20–5.82)
RDW: 14.7 % — ABNORMAL HIGH (ref 11.0–14.6)
WBC: 9.7 10*3/uL (ref 4.0–10.3)

## 2018-01-12 LAB — CMP (CANCER CENTER ONLY)
ALBUMIN: 2.4 g/dL — AB (ref 3.5–5.0)
ALT: 14 U/L (ref 0–55)
AST: 23 U/L (ref 5–34)
Alkaline Phosphatase: 79 U/L (ref 40–150)
Anion gap: 6 (ref 3–11)
BUN: 9 mg/dL (ref 7–26)
CO2: 29 mmol/L (ref 22–29)
Calcium: 8.2 mg/dL — ABNORMAL LOW (ref 8.4–10.4)
Chloride: 100 mmol/L (ref 98–109)
Creatinine: 0.73 mg/dL (ref 0.70–1.30)
GFR, Est AFR Am: >60 mL/min (ref >60)
GFR, Estimated: >60 mL/min (ref >60)
GLUCOSE: 85 mg/dL (ref 70–140)
POTASSIUM: 4.2 mmol/L (ref 3.5–5.1)
Sodium: 135 mmol/L — ABNORMAL LOW (ref 136–145)
Total Bilirubin: 0.3 mg/dL (ref 0.2–1.2)
Total Protein: 7.1 g/dL (ref 6.4–8.3)

## 2018-01-12 LAB — LACTATE DEHYDROGENASE: LDH: 196 U/L (ref 125–245)

## 2018-01-12 MED ORDER — PROCHLORPERAZINE MALEATE 10 MG PO TABS
10.0000 mg | ORAL_TABLET | Freq: Once | ORAL | Status: AC
  Administered 2018-01-12: 10 mg via ORAL

## 2018-01-12 MED ORDER — SODIUM CHLORIDE 0.9 % IJ SOLN
INTRAMUSCULAR | Status: AC
  Filled 2018-01-12: qty 50

## 2018-01-12 MED ORDER — IOPAMIDOL (ISOVUE-370) INJECTION 76%
INTRAVENOUS | Status: AC
  Filled 2018-01-12: qty 100

## 2018-01-12 MED ORDER — PROCHLORPERAZINE MALEATE 10 MG PO TABS
ORAL_TABLET | ORAL | Status: AC
  Filled 2018-01-12: qty 1

## 2018-01-12 MED ORDER — BORTEZOMIB CHEMO SQ INJECTION 3.5 MG (2.5MG/ML)
1.3000 mg/m2 | Freq: Once | INTRAMUSCULAR | Status: AC
  Administered 2018-01-12: 1.75 mg via SUBCUTANEOUS
  Filled 2018-01-12: qty 1.75

## 2018-01-12 MED ORDER — OXYCODONE HCL 10 MG PO TABS
5.0000 mg | ORAL_TABLET | ORAL | 0 refills | Status: AC | PRN
Start: 2018-01-12 — End: 2018-02-02

## 2018-01-12 MED ORDER — IOPAMIDOL (ISOVUE-370) INJECTION 76%
100.0000 mL | Freq: Once | INTRAVENOUS | Status: AC | PRN
  Administered 2018-01-12: 62 mL via INTRAVENOUS

## 2018-01-12 MED FILL — oxyCODONE HCL 10 MG TABS: 10 | 20 days supply | Qty: 60 | Fill #0

## 2018-01-12 NOTE — Patient Instructions (Signed)
[facility] Discharge Instructions for Patients Receiving Chemotherapy  Today you received the following chemotherapy agents: Bortezomib (Velcade)  To help prevent nausea and vomiting after your treatment, we encourage you to take your nausea medication  as prescribed.    If you develop nausea and vomiting that is not controlled by your nausea medication, call the clinic.   BELOW ARE SYMPTOMS THAT SHOULD BE REPORTED IMMEDIATELY:  *FEVER GREATER THAN 100.5 F  *CHILLS WITH OR WITHOUT FEVER  NAUSEA AND VOMITING THAT IS NOT CONTROLLED WITH YOUR NAUSEA MEDICATION  *UNUSUAL SHORTNESS OF BREATH  *UNUSUAL BRUISING OR BLEEDING  TENDERNESS IN MOUTH AND THROAT WITH OR WITHOUT PRESENCE OF ULCERS  *URINARY PROBLEMS  *BOWEL PROBLEMS  UNUSUAL RASH Items with * indicate a potential emergency and should be followed up as soon as possible.  Feel free to call the clinic should you have any questions or concerns. The clinic phone number is [phone].  Please show the CHEMO ALERT CARD at check-in to the Emergency Department and triage nurse.   

## 2018-01-12 NOTE — Telephone Encounter (Signed)
Appointments scheduled AVS/Calendar printed per 3/11 los °

## 2018-01-13 ENCOUNTER — Encounter (HOSPITAL_COMMUNITY): Payer: Self-pay | Admitting: Hematology and Oncology

## 2018-01-13 LAB — TISSUE HYBRIDIZATION (BONE MARROW)-NCBH

## 2018-01-13 LAB — KAPPA/LAMBDA LIGHT CHAINS
KAPPA FREE LGHT CHN: 39.5 mg/L — AB (ref 3.3–19.4)
Kappa, lambda light chain ratio: 3.35 — ABNORMAL HIGH (ref 0.26–1.65)
Lambda free light chains: 11.8 mg/L (ref 5.7–26.3)

## 2018-01-13 LAB — BETA 2 MICROGLOBULIN, SERUM: Beta-2 Microglobulin: 2.4 mg/L (ref 0.6–2.4)

## 2018-01-13 LAB — CHROMOSOME ANALYSIS, BONE MARROW

## 2018-01-15 ENCOUNTER — Other Ambulatory Visit: Payer: Self-pay

## 2018-01-15 ENCOUNTER — Inpatient Hospital Stay: Payer: Self-pay

## 2018-01-15 VITALS — BP 123/72 | HR 92 | Temp 99.1°F | Resp 18

## 2018-01-15 DIAGNOSIS — D472 Monoclonal gammopathy: Secondary | ICD-10-CM

## 2018-01-15 DIAGNOSIS — C9000 Multiple myeloma not having achieved remission: Secondary | ICD-10-CM

## 2018-01-15 DIAGNOSIS — Z7189 Other specified counseling: Secondary | ICD-10-CM

## 2018-01-15 LAB — CMP (CANCER CENTER ONLY)
ALBUMIN: 2.3 g/dL — AB (ref 3.5–5.0)
ALK PHOS: 75 U/L (ref 40–150)
ALT: 14 U/L (ref 0–55)
AST: 22 U/L (ref 5–34)
Anion gap: 6 (ref 3–11)
BUN: 9 mg/dL (ref 7–26)
CO2: 30 mmol/L — ABNORMAL HIGH (ref 22–29)
Calcium: 8.7 mg/dL (ref 8.4–10.4)
Chloride: 101 mmol/L (ref 98–109)
Creatinine: 0.85 mg/dL (ref 0.70–1.30)
GFR, Est AFR Am: >60 mL/min (ref >60)
GLUCOSE: 154 mg/dL — AB (ref 70–140)
POTASSIUM: 4.8 mmol/L (ref 3.5–5.1)
Sodium: 137 mmol/L (ref 136–145)
TOTAL PROTEIN: 7 g/dL (ref 6.4–8.3)

## 2018-01-15 LAB — CBC WITH DIFFERENTIAL (CANCER CENTER ONLY)
BASOS ABS: 0.1 10*3/uL (ref 0.0–0.1)
Basophils Relative: 2 %
EOS ABS: 0.3 10*3/uL (ref 0.0–0.5)
EOS PCT: 3 %
HCT: 31.1 % — ABNORMAL LOW (ref 38.4–49.9)
Hemoglobin: 10.1 g/dL — ABNORMAL LOW (ref 13.0–17.1)
LYMPHS PCT: 17 %
Lymphs Abs: 1.3 10*3/uL (ref 0.9–3.3)
MCH: 26.7 pg — ABNORMAL LOW (ref 27.2–33.4)
MCHC: 32.5 g/dL (ref 32.0–36.0)
MCV: 82.3 fL (ref 79.3–98.0)
MONO ABS: 0.8 10*3/uL (ref 0.1–0.9)
Monocytes Relative: 11 %
Neutro Abs: 5.2 10*3/uL (ref 1.5–6.5)
Neutrophils Relative %: 67 %
PLATELETS: 518 10*3/uL — AB (ref 140–400)
RBC: 3.77 MIL/uL — AB (ref 4.20–5.82)
RDW: 14.5 % (ref 11.0–14.6)
WBC: 7.7 10*3/uL (ref 4.0–10.3)

## 2018-01-15 LAB — UPEP/UIFE/LIGHT CHAINS/TP, 24-HR UR
% BETA, Urine: 35.9 %
ALBUMIN, U: 11.5 %
ALPHA 1 URINE: 7.3 %
ALPHA 2 UR: 12.4 %
FREE KAPPA LT CHAINS, UR: 15.9 mg/L (ref 1.35–24.19)
FREE KAPPA/LAMBDA RATIO: 17.87 — AB (ref 2.04–10.37)
Free Lambda Lt Chains,Ur: 0.89 mg/L (ref 0.24–6.66)
GAMMA GLOBULIN URINE: 32.9 %
Total Protein, Urine-Ur/day: 202 mg/24 hr — ABNORMAL HIGH (ref 30–150)
Total Protein, Urine: 11.9 mg/dL
Total Volume: 1700

## 2018-01-15 LAB — MULTIPLE MYELOMA PANEL, SERUM
ALBUMIN SERPL ELPH-MCNC: 2.6 g/dL — AB (ref 2.9–4.4)
ALPHA 1: 0.3 g/dL (ref 0.0–0.4)
ALPHA2 GLOB SERPL ELPH-MCNC: 0.7 g/dL (ref 0.4–1.0)
Albumin/Glob SerPl: 0.7 (ref 0.7–1.7)
B-GLOBULIN SERPL ELPH-MCNC: 0.8 g/dL (ref 0.7–1.3)
GAMMA GLOB SERPL ELPH-MCNC: 2 g/dL — AB (ref 0.4–1.8)
GLOBULIN, TOTAL: 3.8 g/dL (ref 2.2–3.9)
IGG (IMMUNOGLOBIN G), SERUM: 2278 mg/dL — AB (ref 700–1600)
IgA: 43 mg/dL — ABNORMAL LOW (ref 90–386)
IgM (Immunoglobulin M), Srm: 16 mg/dL — ABNORMAL LOW (ref 20–172)
M PROTEIN SERPL ELPH-MCNC: 1.5 g/dL — AB
TOTAL PROTEIN ELP: 6.4 g/dL (ref 6.0–8.5)

## 2018-01-15 MED ORDER — BORTEZOMIB CHEMO SQ INJECTION 3.5 MG (2.5MG/ML)
1.3000 mg/m2 | Freq: Once | INTRAMUSCULAR | Status: AC
Start: 2018-01-15 — End: 2018-01-15
  Administered 2018-01-15: 1.75 mg via SUBCUTANEOUS
  Filled 2018-01-15: qty 1.75

## 2018-01-15 MED ORDER — PROCHLORPERAZINE MALEATE 10 MG PO TABS
ORAL_TABLET | ORAL | Status: AC
  Filled 2018-01-15: qty 1

## 2018-01-15 MED ORDER — PROCHLORPERAZINE MALEATE 10 MG PO TABS
10.0000 mg | ORAL_TABLET | Freq: Once | ORAL | Status: AC
Start: 2018-01-15 — End: 2018-01-15
  Administered 2018-01-15: 10 mg via ORAL

## 2018-01-15 NOTE — Patient Instructions (Signed)
[facility] Discharge Instructions for Patients Receiving Chemotherapy  Today you received the following chemotherapy agents Velcade.  To help prevent nausea and vomiting after your treatment, we encourage you to take your nausea medication as directed.  If you develop nausea and vomiting that is not controlled by your nausea medication, call the clinic.   BELOW ARE SYMPTOMS THAT SHOULD BE REPORTED IMMEDIATELY:  *FEVER GREATER THAN 100.5 F  *CHILLS WITH OR WITHOUT FEVER  NAUSEA AND VOMITING THAT IS NOT CONTROLLED WITH YOUR NAUSEA MEDICATION  *UNUSUAL SHORTNESS OF BREATH  *UNUSUAL BRUISING OR BLEEDING  TENDERNESS IN MOUTH AND THROAT WITH OR WITHOUT PRESENCE OF ULCERS  *URINARY PROBLEMS  *BOWEL PROBLEMS  UNUSUAL RASH Items with * indicate a potential emergency and should be followed up as soon as possible.  Feel free to call the clinic should you have any questions or concerns. The clinic phone number is [phone].  Please show the CHEMO ALERT CARD at check-in to the Emergency Department and triage nurse.   

## 2018-01-16 ENCOUNTER — Other Ambulatory Visit: Payer: Self-pay | Admitting: Medical Oncology

## 2018-01-16 DIAGNOSIS — C9000 Multiple myeloma not having achieved remission: Secondary | ICD-10-CM

## 2018-01-19 ENCOUNTER — Other Ambulatory Visit: Payer: Self-pay | Admitting: Hematology and Oncology

## 2018-01-19 ENCOUNTER — Inpatient Hospital Stay: Payer: Self-pay

## 2018-01-19 VITALS — BP 132/72 | HR 57 | Temp 98.3°F | Resp 24

## 2018-01-19 DIAGNOSIS — C9000 Multiple myeloma not having achieved remission: Secondary | ICD-10-CM

## 2018-01-19 DIAGNOSIS — Z7189 Other specified counseling: Secondary | ICD-10-CM

## 2018-01-19 DIAGNOSIS — R0602 Shortness of breath: Secondary | ICD-10-CM

## 2018-01-19 LAB — CBC WITH DIFFERENTIAL (CANCER CENTER ONLY)
BASOS ABS: 0 10*3/uL (ref 0.0–0.1)
Basophils Relative: 0 %
EOS PCT: 3 %
Eosinophils Absolute: 0.3 10*3/uL (ref 0.0–0.5)
HCT: 32.5 % — ABNORMAL LOW (ref 38.4–49.9)
Hemoglobin: 10.5 g/dL — ABNORMAL LOW (ref 13.0–17.1)
LYMPHS ABS: 1.7 10*3/uL (ref 0.9–3.3)
LYMPHS PCT: 21 %
MCH: 26.1 pg — AB (ref 27.2–33.4)
MCHC: 32.3 g/dL (ref 32.0–36.0)
MCV: 80.8 fL (ref 79.3–98.0)
MONO ABS: 1.2 10*3/uL — AB (ref 0.1–0.9)
Monocytes Relative: 15 %
Neutro Abs: 4.7 10*3/uL (ref 1.5–6.5)
Neutrophils Relative %: 61 %
PLATELETS: 249 10*3/uL (ref 140–400)
RBC: 4.02 MIL/uL — ABNORMAL LOW (ref 4.20–5.82)
RDW: 14.5 % (ref 11.0–14.6)
WBC Count: 7.8 10*3/uL (ref 4.0–10.3)

## 2018-01-19 LAB — CMP (CANCER CENTER ONLY)
ALT: 13 U/L (ref 0–55)
AST: 26 U/L (ref 5–34)
Albumin: 2.5 g/dL — ABNORMAL LOW (ref 3.5–5.0)
Alkaline Phosphatase: 73 U/L (ref 40–150)
Anion gap: 5 (ref 3–11)
BUN: 8 mg/dL (ref 7–26)
CHLORIDE: 101 mmol/L (ref 98–109)
CO2: 30 mmol/L — ABNORMAL HIGH (ref 22–29)
Calcium: 8.3 mg/dL — ABNORMAL LOW (ref 8.4–10.4)
Creatinine: 0.87 mg/dL (ref 0.70–1.30)
Glucose, Bld: 131 mg/dL (ref 70–140)
POTASSIUM: 4.4 mmol/L (ref 3.5–5.1)
Sodium: 136 mmol/L (ref 136–145)
Total Bilirubin: 0.3 mg/dL (ref 0.2–1.2)
Total Protein: 7.3 g/dL (ref 6.4–8.3)

## 2018-01-19 MED ORDER — BORTEZOMIB CHEMO SQ INJECTION 3.5 MG (2.5MG/ML)
1.3000 mg/m2 | Freq: Once | INTRAMUSCULAR | Status: AC
  Administered 2018-01-19: 1.75 mg via SUBCUTANEOUS
  Filled 2018-01-19: qty 1.75

## 2018-01-19 MED ORDER — FUROSEMIDE 20 MG PO TABS: 40.0000 mg | ORAL_TABLET | Freq: Once | ORAL | Status: DC

## 2018-01-19 MED ORDER — PROCHLORPERAZINE MALEATE 10 MG PO TABS
ORAL_TABLET | ORAL | Status: AC
  Filled 2018-01-19: qty 1

## 2018-01-19 MED ORDER — ZOLEDRONIC ACID 4 MG/5ML IV CONC
3.5000 mg | Freq: Once | INTRAVENOUS | Status: AC
  Administered 2018-01-19: 3.5 mg via INTRAVENOUS
  Filled 2018-01-19: qty 4.38

## 2018-01-19 MED ORDER — ZOLEDRONIC ACID 4 MG/100ML IV SOLN: 4.0000 mg | Freq: Once | INTRAVENOUS | Status: DC

## 2018-01-19 MED ORDER — FUROSEMIDE 20 MG PO TABS
ORAL_TABLET | ORAL | Status: AC
  Filled 2018-01-19: qty 2

## 2018-01-19 MED ORDER — SODIUM CHLORIDE 0.9 % IV SOLN: Freq: Once | INTRAVENOUS | Status: DC

## 2018-01-19 MED ORDER — FUROSEMIDE 20 MG PO TABS
40.0000 mg | ORAL_TABLET | Freq: Once | ORAL | Status: AC
  Administered 2018-01-19: 40 mg via ORAL

## 2018-01-19 MED ORDER — PROCHLORPERAZINE MALEATE 10 MG PO TABS
10.0000 mg | ORAL_TABLET | Freq: Once | ORAL | Status: AC
  Administered 2018-01-19: 10 mg via ORAL

## 2018-01-19 NOTE — Patient Instructions (Signed)
[facility] Discharge Instructions for Patients Receiving Chemotherapy  Today you received the following chemotherapy agents Velcade  To help prevent nausea and vomiting after your treatment, we encourage you to take your nausea medication as directed If you develop nausea and vomiting that is not controlled by your nausea medication, call the clinic.   BELOW ARE SYMPTOMS THAT SHOULD BE REPORTED IMMEDIATELY:  *FEVER GREATER THAN 100.5 F  *CHILLS WITH OR WITHOUT FEVER  NAUSEA AND VOMITING THAT IS NOT CONTROLLED WITH YOUR NAUSEA MEDICATION  *UNUSUAL SHORTNESS OF BREATH  *UNUSUAL BRUISING OR BLEEDING  TENDERNESS IN MOUTH AND THROAT WITH OR WITHOUT PRESENCE OF ULCERS  *URINARY PROBLEMS  *BOWEL PROBLEMS  UNUSUAL RASH Items with * indicate a potential emergency and should be followed up as soon as possible.  Feel free to call the clinic should you have any questions or concerns. The clinic phone number is [phone].  Please show the CHEMO ALERT CARD at check-in to the Emergency Department and triage nurse.   Zoledronic Acid injection (Hypercalcemia, Oncology) What is this medicine? ZOLEDRONIC ACID (ZOE le dron ik AS id) lowers the amount of calcium loss from bone. It is used to treat too much calcium in your blood from cancer. It is also used to prevent complications of cancer that has spread to the bone. This medicine may be used for other purposes; ask your health care provider or pharmacist if you have questions. COMMON BRAND NAME(S): Zometa What should I tell my health care provider before I take this medicine? They need to know if you have any of these conditions: -aspirin-sensitive asthma -cancer, especially if you are receiving medicines used to treat cancer -dental disease or wear dentures -infection -kidney disease -receiving corticosteroids like dexamethasone or prednisone -an unusual or allergic reaction to zoledronic acid, other medicines,  foods, dyes, or preservatives -pregnant or trying to get pregnant -breast-feeding How should I use this medicine? This medicine is for infusion into a vein. It is given by a health care professional in a hospital or clinic setting. Talk to your pediatrician regarding the use of this medicine in children. Special care may be needed. Overdosage: If you think you have taken too much of this medicine contact a poison control center or emergency room at once. NOTE: This medicine is only for you. Do not share this medicine with others. What if I miss a dose? It is important not to miss your dose. Call your doctor or health care professional if you are unable to keep an appointment. What may interact with this medicine? -certain antibiotics given by injection -NSAIDs, medicines for pain and inflammation, like ibuprofen or naproxen -some diuretics like bumetanide, furosemide -teriparatide -thalidomide This list may not describe all possible interactions. Give your health care provider a list of all the medicines, herbs, non-prescription drugs, or dietary supplements you use. Also tell them if you smoke, drink alcohol, or use illegal drugs. Some items may interact with your medicine. What should I watch for while using this medicine? Visit your doctor or health care professional for regular checkups. It may be some time before you see the benefit from this medicine. Do not stop taking your medicine unless your doctor tells you to. Your doctor may order blood tests or other tests to see how you are doing. Women should inform their doctor if they wish to become pregnant or think they might be pregnant. There is a potential for serious side effects to an unborn child. Talk to your   health care professional or pharmacist for more information. You should make sure that you get enough calcium and vitamin D while you are taking this medicine. Discuss the foods you eat and the vitamins you take with your health  care professional. Some people who take this medicine have severe bone, joint, and/or muscle pain. This medicine may also increase your risk for jaw problems or a broken thigh bone. Tell your doctor right away if you have severe pain in your jaw, bones, joints, or muscles. Tell your doctor if you have any pain that does not go away or that gets worse. Tell your dentist and dental surgeon that you are taking this medicine. You should not have major dental surgery while on this medicine. See your dentist to have a dental exam and fix any dental problems before starting this medicine. Take good care of your teeth while on this medicine. Make sure you see your dentist for regular follow-up appointments. What side effects may I notice from receiving this medicine? Side effects that you should report to your doctor or health care professional as soon as possible: -allergic reactions like skin rash, itching or hives, swelling of the face, lips, or tongue -anxiety, confusion, or depression -breathing problems -changes in vision -eye pain -feeling faint or lightheaded, falls -jaw pain, especially after dental work -mouth sores -muscle cramps, stiffness, or weakness -redness, blistering, peeling or loosening of the skin, including inside the mouth -trouble passing urine or change in the amount of urine Side effects that usually do not require medical attention (report to your doctor or health care professional if they continue or are bothersome): -bone, joint, or muscle pain -constipation -diarrhea -fever -hair loss -irritation at site where injected -loss of appetite -nausea, vomiting -stomach upset -trouble sleeping -trouble swallowing -weak or tired This list may not describe all possible side effects. Call your doctor for medical advice about side effects. You may report side effects to FDA at 1-800-FDA-1088. Where should I keep my medicine? This drug is given in a hospital or clinic and  will not be stored at home. NOTE: This sheet is a summary. It may not cover all possible information. If you have questions about this medicine, talk to your doctor, pharmacist, or health care provider.  2018 [employer] Standard (2014-03-19 14:19:39)    

## 2018-01-19 NOTE — Progress Notes (Signed)
Patient presented to treatment today with c/o progressive dyspnea at rest since Friday, 01/16/2018. States dyspnea is markedly worse with exertion. Also c/o LUQ abdominal pain @ costal margin and non-productive cough. Lung sounds coarse in bilateral bases with R>L. Dr. Lebron Conners aware of complaints and came to infusion to evaluate patient. Orders received, repeated, and confirmed for Lasix to be given in treatment today. Orders carried out. Patient encouraged to call office or go to nearest ED if symptoms worsen. Patient verbalized understanding.

## 2018-01-20 NOTE — Progress Notes (Signed)
[place] [facility] Follow-up Visit:  Assessment: Multiple myeloma ([place]) 57 y.o.  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  Based on the diagnosis, patient was started on treatment with lenalidomide, bortezomib, low-dose dexamethasone and is currently undergoing cycle 2 day 8 of therapy.  Tolerating treatment well without significant side effects at this time.  Clinical evaluation lab workup permissive to continue treated as scheduled.  Patient presents for possible initiation of the fourth cycle of systemic injection therapy.  After 3 cycles, bone marrow biopsy demonstrates findings consistent with partial response with 50% reduction of the plasma cell burden.  Today, clinical evaluation lab workup permissive to proceed with the treatment as scheduled.  Patient does have pleuritic type chest discomfort in the left lower ribs.  Due to current therapy involving lenalidomide which does have an elevated risk of thromboembolism, will need to assess patient for possible development of pulmonary embolism.  Plan: -CTA chest for possible pulmonary embolism -Discontinue tramadol due to lack of benefit for pain control, start patient on oxycodone. -Proceed with cycle #4 of lenalidomide, bortezomib, low-dose dexamethasone.  We will extend therapy to a total of 6 cycles with repeat bone marrow biopsy for disease assessment at the completion of therapy due to persistent disease after 3 cycles of treatment. - Return to clinic in 3 weeks with labs to consider possible fifth cycle of systemic therapy.  Voice recognition software was used and creation of this note. Despite my best effort at editing the text, some misspelling/errors may have occurred.  Orders Placed This Encounter   Procedures  . CT ANGIO CHEST PE W OR WO CONTRAST    Standing Status:   Future    Number of Occurrences:   1    Standing Expiration Date:   04/15/2019    Order Specific Question:   If indicated for the ordered procedure, I authorize the administration of contrast media per Radiology protocol    Answer:   Yes    Order Specific Question:   Preferred imaging location?    Answer:   [facility]    Order Specific Question:   Call Results- Best Contact Number?    Answer:   [phone] pod number [phone]....patient to be held     Order Specific Question:   Radiology Contrast Protocol - do NOT remove file path    Answer:   [email or web address]    Order Specific Question:   Reason for Exam additional comments    Answer:   Pleuritic chest pain -- please eval for PE  . CBC with Differential ([place] Only)    Standing Status:   Future    Standing Expiration Date:   01/13/2019  . CMP ([place] only)    Standing Status:   Future    Standing Expiration Date:   01/13/2019  . Magnesium    Standing Status:   Future    Standing Expiration Date:   01/12/2019  . Phosphorus    Standing Status:   Future    Standing Expiration Date:   01/12/2019  . Lactate dehydrogenase (LDH)    Standing Status:   Future    Standing Expiration Date:   01/12/2019  . Beta 2 microglobulin    Standing Status:   Future    Standing Expiration Date:  01/12/2019  . Multiple Myeloma Panel (SPEP&IFE w/QIG)    Standing Status:   Future    Standing Expiration Date:   01/12/2019  . Kappa/lambda light chains    Standing Status:   Future    Standing Expiration Date:   01/12/2019    Cancer Staging Multiple myeloma ([place]) Staging form: Plasma Cell Myeloma and Plasma Cell Disorders, AJCC 8th Edition - Clinical stage from 11/20/2017: RISS Stage II (Beta-2-microglobulin (mg/L): 2.1, Albumin (g/dL): 2.7, ISS: Stage II, High-risk cytogenetics: Absent, LDH: Normal) - Signed by Ardath Sax, MD on  12/09/2017   All questions were answered.  . The patient knows to call the clinic with any problems, questions or concerns.  This note was electronically signed.    History of Presenting Illness [name] [name] is a 57 y.o. male followed in the [place] for monoclonal Gammopathy of unknown significance. Patient's past medical history is significant for recurrent left empyema and pulmonary hypertension. Patient has been treated with VATS/decortication of the left lung.Also has history or heterozygous alpha-1 Anti-trypsin deficiency, MZ phenotype, COPD.  Patient returns to the clinic for continued therapy with lenalidomide, bortezomib, low-dose dexamethasone regimen.  In the interim, patient underwent restaging bone marrow biopsy consistent with partial disease response.  Patient reports taking lenalidomide as prescribed.  Complains of fatigue, also complains of pain in the left lower ribs making deep breaths difficult due to tightness.  Denies fevers, chills, night sweats.  Denies cough.  No new or progressive neuropathy.  Oncological/hematological History: --ECHO, 07/01/17: left ventricular ejection fraction 60 to 65%, normal Left ventricular wall thickness, impaired relaxation. --Labs, 08/26/17: SPEP -- restricted band in gamma globulins, suggestive of possible medical protein.    Multiple myeloma ([place])   09/29/2017 Tumor Marker    tProt 10.0, Alb 2.7, Ca 8.9, Cr 0.9, AP 75 LDH 169 SPEP -- M-Spike 3.4g/dL, SIFE -- IgG kappa;  IgG 4555, IgA 142, IgM 32; kappa 74.2, lambda 15.8, KLR 4.70;  WBC 5.2, Hgb 12.2, Plt 286      10/21/2017 PET scan    Hypermetabolic lytic lesion measuring 1.5 cm in L4. otherwise, no significant hypermetabolic skeletal disease.      11/03/2017 Bone Marrow Biopsy    Pathology: SLIGHTLY HYPERCELLULAR BONE MARROW FOR AGE WITH PLASMA CELL NEOPLASM. Peripheral blood RED BLOOD CELLS WITH ROULEAUX FORMATION. --The aspirate material is suboptimal with lack of bone  marrow particles for evaluation. Touch imprints and core biopsy show a slightly hypercellular bone marrow for age with increased number of atypical plasma cells representing 24% of all cells on touch imprints and associated with interstitial infiltrates and small clusters in the core biopsy. This is seen in a background of a mixture of myeloid cell types. Immunohistochemical stains highlight the increased plasma cell component in the bone marrow, which shows kappa light chain restriction consistent with plasma cell neoplasm. --CytoGen: No clonal cytogenetic abnormalities --FISH: Positive for loss of D13S319 and loss of 13q34      11/07/2017 Initial Diagnosis    Multiple myeloma ([place])      11/10/2017 -  Chemotherapy    Lenalidomide 30m PO Qday, d1-14 + Bortezomib 1.38mm2, d1,4,8,11 + Dexamethasone 4021mWk Q21d --Cycle #1, 11/10/17: Therapy started without lenalidomide to assess tolerance. --Cycle #2, 12/01/17: --Cycle #3, 12/22/17:       11/24/2017 Tumor Marker    tProt 9.6, Alb 3.0, Ca 8.7, Cr 0.8, AP 107, AST 521, ALT 26 LDH 255, bete-2 microglobulin 1.9 SPEP -- MSpike 2.5g/dL IgG 3199, IgA 111, IgM  36; kappa 39.2, lambda 15.8, KLR 4.84; WBC 8.5, Hgb 14.4, Plt 120       01/02/2018 Bone Marrow Biopsy    There are increased monoclonal plasma cells (7% aspirate, 10% by CD138) consistent with persistent plasma cell neoplasm. Compared to the prior marrow ([id number]) there has been a moderate decrease in plasma cells (24% to 10%). There is some atypia in the erythroid and megakaryocytic lineages. Given these changes were not noted in the prior marrow, they may be reactive in nature. --Cytogenetics: Normal --FISH: Normal (the previously noted clones positive for mutations are reduced below significance threshold).       Medical History: Past Medical History:  Diagnosis Date  . Hypertension   . Kidney stones   . Pulmonary hypertension ([place])     Surgical History: Past Surgical  History:  Procedure Laterality Date  . CARDIAC CATHETERIZATION N/A 01/18/2016   Procedure: Right Heart Cath;  Surgeon: Jolaine Artist, MD;  Location: [facility];  Service: Cardiovascular;  Laterality: N/A;  . CARDIAC CATHETERIZATION N/A 06/24/2016   Procedure: Right Heart Cath;  Surgeon: Jolaine Artist, MD;  Location: [facility];  Service: Cardiovascular;  Laterality: N/A;  . KIDNEY SURGERY    . VIDEO ASSISTED THORACOSCOPY (VATS)/DECORTICATION Left 2003  . VIDEO ASSISTED THORACOSCOPY (VATS)/DECORTICATION Left 10/02/2015   Procedure: VIDEO ASSISTED THORACOSCOPY (VATS)/DECORTICATION and drainage of chronic empyena;  Surgeon: [name], MD;  Location: [facility];  Service: Thoracic;  Laterality: Left;  Marland Kitchen VIDEO BRONCHOSCOPY N/A 10/02/2015   Procedure: VIDEO BRONCHOSCOPY;  Surgeon: [name], MD;  Location: [facility];  Service: Thoracic;  Laterality: N/A;    Family History: Family History  Problem Relation Age of Onset  . Hypertension Other     Social History: Social History   Socioeconomic History  . Marital status: Single    Spouse name: Not on file  . Number of children: Not on file  . Years of education: Not on file  . Highest education level: Not on file  Social Needs  . Financial resource strain: Not on file  . Food insecurity - worry: Not on file  . Food insecurity - inability: Not on file  . Transportation needs - medical: Not on file  . Transportation needs - non-medical: Not on file  Occupational History  . Not on file  Tobacco Use  . Smoking status: Never Smoker  . Smokeless tobacco: Never Used  Substance and Sexual Activity  . Alcohol use: Yes  . Drug use: No  . Sexual activity: Not on file  Other Topics Concern  . Not on file  Social History Narrative  . Not on file    Allergies: No Known Allergies  Medications:  Current Outpatient Medications  Medication Sig Dispense Refill  . acyclovir (ZOVIRAX) 400 MG tablet Take 1 tablet  (400 mg total) by mouth 2 (two) times daily. 60 tablet 3  . albuterol (PROVENTIL HFA;VENTOLIN HFA) 108 (90 Base) MCG/ACT inhaler Inhale 2 puffs into the lungs every 6 (six) hours as needed for wheezing or shortness of breath. 1 Inhaler 2  . aspirin EC 325 MG EC tablet Take 1 tablet (325 mg total) by mouth daily. 30 tablet 3  . dexamethasone (DECADRON) 4 MG tablet Take 5 tablets (20 mg total) by mouth once a week. 20 tablet 3  . ondansetron (ZOFRAN) 8 MG tablet Take 1 tablet (8 mg total) by mouth 2 (two) times daily as needed (Nausea or vomiting). 30 tablet 1  .  OXYGEN 2lpm BEDTIME ONLY    . tadalafil, PAH, (ADCIRCA) 20 MG tablet Take 2 tablets (40 mg total) by mouth daily. 60 tablet 11  . umeclidinium-vilanterol (ANORO ELLIPTA) 62.5-25 MCG/INH AEPB Inhale 1 puff into the lungs daily. 60 each 5  . oxyCODONE 10 MG TABS Take 0.5 tablets (5 mg total) by mouth every 4 (four) hours as needed for up to 21 days for severe pain. 60 tablet 0   No current facility-administered medications for this visit.    Facility-Administered Medications Ordered in Other Visits  Medication Dose Route Frequency Provider Last Rate Last Dose  . furosemide (LASIX) tablet 40 mg  40 mg Oral Once Ardath Sax, MD        Review of Systems: Review of Systems  Gastrointestinal: Positive for constipation.  Genitourinary: Positive for difficulty urinating.   Musculoskeletal: Positive for flank pain.  All other systems reviewed and are negative.    PHYSICAL EXAMINATION Blood pressure 103/63, pulse 87, temperature 98 F (36.7 C), temperature source Oral, resp. rate 20, height 5' (1.524 m), weight 98 lb 3.2 oz (44.5 kg), SpO2 95 %.  ECOG PERFORMANCE STATUS: 1 - Symptomatic but completely ambulatory  Physical Exam  Constitutional: He is oriented to person, place, and time and well-developed, well-nourished, and in no distress. No distress.  HENT:  Head: Normocephalic and atraumatic.  Mouth/Throat: Oropharynx is  clear and moist. No oropharyngeal exudate.  Eyes: Conjunctivae are normal. Pupils are equal, round, and reactive to light. No scleral icterus.  Neck: No thyromegaly present.  Cardiovascular: Normal rate, regular rhythm and normal heart sounds.  No murmur heard. Pulmonary/Chest:  Decreased breath sounds in the lower portion of the left lung.  Normal breath sounds on the right.  Abdominal: Soft. Bowel sounds are normal. He exhibits no distension. There is no tenderness. There is no rebound.  Musculoskeletal: He exhibits no edema.  Lymphadenopathy:    He has no cervical adenopathy.  Neurological: He is alert and oriented to person, place, and time. He has normal reflexes. No cranial nerve deficit.  Skin: Skin is warm and dry. No rash noted. He is not diaphoretic. No erythema.     LABORATORY DATA: I have personally reviewed the data as listed: Appointment on 01/12/2018  Component Date Value Ref Range Status  . Kappa free light chain 01/12/2018 39.5* 3.3 - 19.4 mg/L Final  . Lamda free light chains 01/12/2018 11.8  5.7 - 26.3 mg/L Final  . Kappa, lamda light chain ratio 01/12/2018 3.35* 0.26 - 1.65 Final   Comment: (NOTE) Performed At: [facility] [place], [place] [id number] Rush Farmer MD [id number] Performed at [facility], [place] [address], [place], [place] [address]   . IgG (Immunoglobin G), Serum 01/12/2018 2,278* 700 - 1,600 mg/dL Final  . IgA 01/12/2018 43* 90 - 386 mg/dL Final   Result confirmed on concentration.  . IgM (Immunoglobulin M), Srm 01/12/2018 16* 20 - 172 mg/dL Final   Result confirmed on concentration.  . Total Protein ELP 01/12/2018 6.4  6.0 - 8.5 g/dL Corrected  . Albumin SerPl Elph-Mcnc 01/12/2018 2.6* 2.9 - 4.4 g/dL Corrected  . Alpha 1 01/12/2018 0.3  0.0 - 0.4 g/dL Corrected  . Alpha2 Glob SerPl Elph-Mcnc 01/12/2018 0.7  0.4 - 1.0 g/dL Corrected  . B-Globulin SerPl Elph-Mcnc 01/12/2018 0.8  0.7 - 1.3  g/dL Corrected  . Gamma Glob SerPl Elph-Mcnc 01/12/2018 2.0* 0.4 - 1.8 g/dL Corrected  . M Protein SerPl Elph-Mcnc 01/12/2018  1.5* Not Observed g/dL Corrected  . Globulin, Total 01/12/2018 3.8  2.2 - 3.9 g/dL Corrected  . Albumin/Glob SerPl 01/12/2018 0.7  0.7 - 1.7 Corrected  . IFE 1 01/12/2018 Comment   Corrected   Comment: (NOTE) Immunofixation shows IgG monoclonal protein with kappa light chain specificity. Please note that samples from patients receiving DARZALEX(R) (daratumumab) treatment can appear as an "IgG kappa" and mask a complete response. If this patient is receiving DARA, this IFE assay interference can be removed by ordering test number [id number]-"Immunofixation, Daratumumab-Specific, Serum" and submitting a new sample for testing or by calling the lab to add this test to the current sample.   . Please Note 01/12/2018 Comment   Corrected   Comment: (NOTE) Protein electrophoresis scan will follow via computer, mail, or courier delivery. Performed At: [facility] [place], [place] [id number] Rush Farmer MD [id number] Performed at [facility], [place] [address], [place], [place] [address]   . Beta-2 Microglobulin 01/12/2018 2.4  0.6 - 2.4 mg/L Final   Comment: (NOTE) Siemens Immulite 2000 Immunochemiluminometric assay (ICMA) Values obtained with different assay methods or kits cannot be used interchangeably. Results cannot be interpreted as absolute evidence of the presence or absence of malignant disease. Performed At: [facility] [place], [place] [id number] Rush Farmer MD [id number] Performed at [facility], [place] [address], [place], [place] [address]   . LDH 01/12/2018 196  125 - 245 U/L Final   Performed at [facility], [place] [address], [place], [place] [address]  . Sodium 01/12/2018 135* 136 - 145 mmol/L Final  . Potassium  01/12/2018 4.2  3.5 - 5.1 mmol/L Final  . Chloride 01/12/2018 100  98 - 109 mmol/L Final  . CO2 01/12/2018 29  22 - 29 mmol/L Final  . Glucose, Bld 01/12/2018 85  70 - 140 mg/dL Final  . BUN 01/12/2018 9  7 - 26 mg/dL Final  . Creatinine 01/12/2018 0.73  0.70 - 1.30 mg/dL Final  . Calcium 01/12/2018 8.2* 8.4 - 10.4 mg/dL Final  . Total Protein 01/12/2018 7.1  6.4 - 8.3 g/dL Final  . Albumin 01/12/2018 2.4* 3.5 - 5.0 g/dL Final  . AST 01/12/2018 23  5 - 34 U/L Final  . ALT 01/12/2018 14  0 - 55 U/L Final  . Alkaline Phosphatase 01/12/2018 79  40 - 150 U/L Final  . Total Bilirubin 01/12/2018 0.3  0.2 - 1.2 mg/dL Final  . GFR, Est Non Af Am 01/12/2018 >60  >60 mL/min Final  . GFR, Est AFR Am 01/12/2018 >60  >60 mL/min Final   Comment: (NOTE) The eGFR has been calculated using the CKD EPI equation. This calculation has not been validated in all clinical situations. eGFR's persistently <60 mL/min signify possible Chronic Kidney Disease.   Georgiann Hahn gap 01/12/2018 6  3 - 11 Final   Performed at [facility], [place] [address], [place], [place] [address]  . WBC Count 01/12/2018 9.7  4.0 - 10.3 K/uL Final  . RBC 01/12/2018 3.81* 4.20 - 5.82 MIL/uL Final  . Hemoglobin 01/12/2018 10.2* 13.0 - 17.1 g/dL Final  . HCT 01/12/2018 31.7* 38.4 - 49.9 % Final  . MCV 01/12/2018 83.0  79.3 - 98.0 fL Final  . MCH 01/12/2018 26.8* 27.2 - 33.4 pg Final  . MCHC 01/12/2018 32.3  32.0 - 36.0 g/dL Final  . RDW 01/12/2018 14.7* 11.0 - 14.6 % Final  .  Platelet Count 01/12/2018 644* 140 - 400 K/uL Final  . Neutrophils Relative % 01/12/2018 60  % Final  . Neutro Abs 01/12/2018 5.8  1.5 - 6.5 K/uL Final  . Lymphocytes Relative 01/12/2018 17  % Final  . Lymphs Abs 01/12/2018 1.7  0.9 - 3.3 K/uL Final  . Monocytes Relative 01/12/2018 17  % Final  . Monocytes Absolute 01/12/2018 1.6* 0.1 - 0.9 K/uL Final  . Eosinophils Relative 01/12/2018 5  % Final  . Eosinophils Absolute 01/12/2018 0.5  0.0  - 0.5 K/uL Final  . Basophils Relative 01/12/2018 1  % Final  . Basophils Absolute 01/12/2018 0.0  0.0 - 0.1 K/uL Final   Performed at [facility], [place] [address] [place], [place] [address]  . Total Protein, Urine 01/14/2018 11.9  Not Estab. mg/dL Final  . Total Protein, Urine-Ur/day 01/14/2018 202* 30 - 150 mg/24 hr Final  . Albumin, U 01/14/2018 11.5  % Final  . ALPHA 1 URINE 01/14/2018 7.3  % Final  . Alpha 2, Urine 01/14/2018 12.4  % Final  . % BETA, Urine 01/14/2018 35.9  % Final  . GAMMA GLOBULIN URINE 01/14/2018 32.9  % Final  . Free Kappa Lt Chains,Ur 01/14/2018 15.90  1.35 - 24.19 mg/L Final  . Free Lambda Lt Chains,Ur 01/14/2018 0.89  0.24 - 6.66 mg/L Corrected   **Results verified by repeat testing**  . Free Kappa/Lambda Ratio 01/14/2018 17.87* 2.04 - 10.37 Corrected   Comment: (NOTE) Performed At:  [facility] [place], [place] [id number] Rush Farmer MD [id number]   . Immunofixation Result, Urine 01/14/2018 Comment   Corrected   Comment: (NOTE) Immunofixation shows IgG monoclonal protein with kappa light chain specificity.   . Total Volume 01/14/2018 1,700   Final  . M-SPIKE %, Urine 01/14/2018 Not Observed  Not Observed % Corrected  . Note: 01/14/2018 Comment   Corrected   Comment: (NOTE) Protein electrophoresis scan will follow via computer, mail, or courier delivery. Performed at [facility], [place] [address] [place], [place] [address]        Ardath Sax, MD

## 2018-01-20 NOTE — Assessment & Plan Note (Signed)
57 y.o.  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  Based on the diagnosis, patient was started on treatment with lenalidomide, bortezomib, low-dose dexamethasone and is currently undergoing cycle 2 day 8 of therapy.  Tolerating treatment well without significant side effects at this time.  Clinical evaluation lab workup permissive to continue treated as scheduled.  Patient presents for possible initiation of the fourth cycle of systemic injection therapy.  After 3 cycles, bone marrow biopsy demonstrates findings consistent with partial response with 50% reduction of the plasma cell burden.  Today, clinical evaluation lab workup permissive to proceed with the treatment as scheduled.  Patient does have pleuritic type chest discomfort in the left lower ribs.  Due to current therapy involving lenalidomide which does have an elevated risk of thromboembolism, will need to assess patient for possible development of pulmonary embolism.  Plan: -CTA chest for possible pulmonary embolism -Discontinue tramadol due to lack of benefit for pain control, start patient on oxycodone. -Proceed with cycle #4 of lenalidomide, bortezomib, low-dose dexamethasone.  We will extend therapy to a total of 6 cycles with repeat bone marrow biopsy for disease assessment at the completion of therapy due to persistent disease after 3 cycles of treatment. - Return to clinic in 3 weeks with labs to consider possible fifth cycle of systemic therapy.

## 2018-01-22 ENCOUNTER — Other Ambulatory Visit: Payer: Self-pay | Admitting: Hematology and Oncology

## 2018-01-22 ENCOUNTER — Inpatient Hospital Stay: Payer: Self-pay

## 2018-01-22 ENCOUNTER — Telehealth: Payer: Self-pay

## 2018-01-22 ENCOUNTER — Other Ambulatory Visit: Payer: Self-pay

## 2018-01-22 VITALS — BP 124/78 | HR 78 | Temp 97.7°F | Resp 19

## 2018-01-22 DIAGNOSIS — R1012 Left upper quadrant pain: Secondary | ICD-10-CM

## 2018-01-22 DIAGNOSIS — C9000 Multiple myeloma not having achieved remission: Secondary | ICD-10-CM

## 2018-01-22 DIAGNOSIS — Z7189 Other specified counseling: Secondary | ICD-10-CM

## 2018-01-22 MED ORDER — PROCHLORPERAZINE MALEATE 10 MG PO TABS
ORAL_TABLET | ORAL | Status: AC
  Filled 2018-01-22: qty 1

## 2018-01-22 MED ORDER — LENALIDOMIDE 25 MG PO CAPS: 25.0000 mg | ORAL_CAPSULE | Freq: Every day | ORAL | 3 refills | Status: DC

## 2018-01-22 MED ORDER — PROCHLORPERAZINE MALEATE 10 MG PO TABS
10.0000 mg | ORAL_TABLET | Freq: Once | ORAL | Status: AC
  Administered 2018-01-22: 10 mg via ORAL

## 2018-01-22 MED ORDER — BORTEZOMIB CHEMO SQ INJECTION 3.5 MG (2.5MG/ML)
1.3000 mg/m2 | Freq: Once | INTRAMUSCULAR | Status: AC
  Administered 2018-01-22: 1.75 mg via SUBCUTANEOUS
  Filled 2018-01-22: qty 1.75

## 2018-01-22 NOTE — Progress Notes (Signed)
Pt continues to c/o for abdominal tightness/discomfort that began on Monday 01/19/18. Dr. Lebron Conners updated and saw pt in treatment area. CT with contrast ordered and pt/significant other updated on appt tomorrow my Paula Compton. Will continue to monitor.

## 2018-01-22 NOTE — Patient Instructions (Signed)
[facility] Discharge Instructions for Patients Receiving Chemotherapy  Today you received the following chemotherapy agents: Bortezomib (Velcade)  To help prevent nausea and vomiting after your treatment, we encourage you to take your nausea medication  as prescribed.    If you develop nausea and vomiting that is not controlled by your nausea medication, call the clinic.   BELOW ARE SYMPTOMS THAT SHOULD BE REPORTED IMMEDIATELY:  *FEVER GREATER THAN 100.5 F  *CHILLS WITH OR WITHOUT FEVER  NAUSEA AND VOMITING THAT IS NOT CONTROLLED WITH YOUR NAUSEA MEDICATION  *UNUSUAL SHORTNESS OF BREATH  *UNUSUAL BRUISING OR BLEEDING  TENDERNESS IN MOUTH AND THROAT WITH OR WITHOUT PRESENCE OF ULCERS  *URINARY PROBLEMS  *BOWEL PROBLEMS  UNUSUAL RASH Items with * indicate a potential emergency and should be followed up as soon as possible.  Feel free to call the clinic should you have any questions or concerns. The clinic phone number is [phone].  Please show the CHEMO ALERT CARD at check-in to the Emergency Department and triage nurse.   

## 2018-01-22 NOTE — Telephone Encounter (Signed)
Friend with patient made aware of appointment for CT scan tomorrow 01/23/18 at 4:15pm. Aware nothing to eat or drink after 12:30pm. Friend picked up contrast for CT in Radiology. Appointment time written down and given to patient/friend.

## 2018-01-23 ENCOUNTER — Other Ambulatory Visit: Payer: Self-pay

## 2018-01-23 ENCOUNTER — Ambulatory Visit (HOSPITAL_COMMUNITY)
Admission: RE | Admit: 2018-01-23 | Discharge: 2018-01-23 | Disposition: A | Discharge status: Home / Self Care | Payer: Self-pay | Source: Ambulatory Visit | Attending: Hematology and Oncology | Admitting: Hematology and Oncology

## 2018-01-23 ENCOUNTER — Encounter (HOSPITAL_COMMUNITY): Payer: Self-pay | Admitting: Radiology

## 2018-01-23 ENCOUNTER — Other Ambulatory Visit: Payer: Self-pay | Admitting: Hematology and Oncology

## 2018-01-23 ENCOUNTER — Telehealth: Payer: Self-pay | Admitting: Pharmacist

## 2018-01-23 DIAGNOSIS — K769 Liver disease, unspecified: Secondary | ICD-10-CM | POA: Insufficient documentation

## 2018-01-23 DIAGNOSIS — R1012 Left upper quadrant pain: Secondary | ICD-10-CM

## 2018-01-23 DIAGNOSIS — C9000 Multiple myeloma not having achieved remission: Secondary | ICD-10-CM

## 2018-01-23 DIAGNOSIS — K5289 Other specified noninfective gastroenteritis and colitis: Secondary | ICD-10-CM | POA: Insufficient documentation

## 2018-01-23 DIAGNOSIS — J984 Other disorders of lung: Secondary | ICD-10-CM | POA: Insufficient documentation

## 2018-01-23 MED ORDER — LENALIDOMIDE 25 MG PO CAPS: 25.0000 mg | ORAL_CAPSULE | Freq: Every day | ORAL | 3 refills | Status: DC

## 2018-01-23 MED ORDER — IOPAMIDOL (ISOVUE-300) INJECTION 61%
INTRAVENOUS | Status: AC
  Administered 2018-01-23: 100 mL
  Filled 2018-01-23: qty 100

## 2018-01-23 NOTE — Telephone Encounter (Signed)
Oral Oncology Pharmacist Encounter  Received new prescription for Revlimid (lenalidomide) for the treatment of multiple myeloma in conjunction with Velcade and dexamethasone, planned duration until disease control.  Labs from 01/19/2018 assessed, okay for treatment. Noted Scr=0.87, est CrCl ~60 mL/min No dose adjustment recommended by manufacturer when est CrCl > 60 mL/min. Manufacturer recommends dose reduction of Revlimid to 10-15 milligrams daily for est CrCl 30-60 mL/min, this will be discussed with MD. Noted current Revlimid prescription at 25 mg daily for 14 days on 7 days off, this is full dose.  Current medication list in Epic reviewed, no DDIs with Revlimid identified. Noted acyclovir and aspirin on patient's medication list. We will reinforce necessity of the supportive care medications at initial counseling for Revlimid.  Noted patient without prescription insurance coverage. Prescription will not be sent to any dispensing pharmacy at this time. Oral oncology clinic will work with patient to try to obtain manufacturer assistance for his Revlimid.  Oral Oncology Clinic will continue to follow for medication acquisition, initial counseling and start date.  Johny Drilling, PharmD, BCPS, BCOP 01/23/2018 4:24 PM Oral Oncology Clinic [phone]

## 2018-01-25 ENCOUNTER — Other Ambulatory Visit: Payer: Self-pay | Admitting: Hematology and Oncology

## 2018-01-25 DIAGNOSIS — K529 Noninfective gastroenteritis and colitis, unspecified: Secondary | ICD-10-CM

## 2018-01-25 MED ORDER — METRONIDAZOLE 500 MG PO TABS: 500.0000 mg | ORAL_TABLET | Freq: Three times a day (TID) | ORAL | 0 refills | Status: DC

## 2018-01-26 ENCOUNTER — Ambulatory Visit: Payer: Self-pay

## 2018-01-26 ENCOUNTER — Inpatient Hospital Stay (HOSPITAL_COMMUNITY): Admission: RE | Admit: 2018-01-26 | Payer: Self-pay | Source: Ambulatory Visit

## 2018-01-26 ENCOUNTER — Other Ambulatory Visit: Payer: Self-pay

## 2018-01-26 ENCOUNTER — Telehealth: Payer: Self-pay

## 2018-01-26 ENCOUNTER — Encounter (HOSPITAL_COMMUNITY): Payer: Self-pay | Admitting: Emergency Medicine

## 2018-01-26 ENCOUNTER — Emergency Department (HOSPITAL_COMMUNITY): Payer: Self-pay

## 2018-01-26 ENCOUNTER — Encounter (HOSPITAL_COMMUNITY): Payer: Self-pay | Admitting: Internal Medicine

## 2018-01-26 ENCOUNTER — Inpatient Hospital Stay (HOSPITAL_COMMUNITY)
Admission: EM | Admit: 2018-01-26 | Discharge: 2018-01-29 | DRG: 193 | Disposition: A | Discharge status: Home / Self Care | Payer: Self-pay | Attending: Internal Medicine | Admitting: Internal Medicine

## 2018-01-26 DIAGNOSIS — J189 Pneumonia, unspecified organism: Principal | ICD-10-CM | POA: Diagnosis present

## 2018-01-26 DIAGNOSIS — C9000 Multiple myeloma not having achieved remission: Secondary | ICD-10-CM | POA: Diagnosis present

## 2018-01-26 DIAGNOSIS — R6881 Early satiety: Secondary | ICD-10-CM | POA: Diagnosis present

## 2018-01-26 DIAGNOSIS — K529 Noninfective gastroenteritis and colitis, unspecified: Secondary | ICD-10-CM | POA: Diagnosis present

## 2018-01-26 DIAGNOSIS — I2721 Secondary pulmonary arterial hypertension: Secondary | ICD-10-CM | POA: Diagnosis present

## 2018-01-26 DIAGNOSIS — D6481 Anemia due to antineoplastic chemotherapy: Secondary | ICD-10-CM | POA: Diagnosis present

## 2018-01-26 DIAGNOSIS — Y95 Nosocomial condition: Secondary | ICD-10-CM | POA: Diagnosis present

## 2018-01-26 DIAGNOSIS — Z8249 Family history of ischemic heart disease and other diseases of the circulatory system: Secondary | ICD-10-CM

## 2018-01-26 DIAGNOSIS — R6520 Severe sepsis without septic shock: Secondary | ICD-10-CM

## 2018-01-26 DIAGNOSIS — I10 Essential (primary) hypertension: Secondary | ICD-10-CM | POA: Diagnosis present

## 2018-01-26 DIAGNOSIS — J9621 Acute and chronic respiratory failure with hypoxia: Secondary | ICD-10-CM

## 2018-01-26 DIAGNOSIS — R55 Syncope and collapse: Secondary | ICD-10-CM | POA: Diagnosis present

## 2018-01-26 DIAGNOSIS — J9611 Chronic respiratory failure with hypoxia: Secondary | ICD-10-CM | POA: Diagnosis present

## 2018-01-26 DIAGNOSIS — Z79899 Other long term (current) drug therapy: Secondary | ICD-10-CM

## 2018-01-26 DIAGNOSIS — J440 Chronic obstructive pulmonary disease with acute lower respiratory infection: Secondary | ICD-10-CM | POA: Diagnosis present

## 2018-01-26 DIAGNOSIS — Z7952 Long term (current) use of systemic steroids: Secondary | ICD-10-CM

## 2018-01-26 DIAGNOSIS — I272 Pulmonary hypertension, unspecified: Secondary | ICD-10-CM | POA: Diagnosis present

## 2018-01-26 DIAGNOSIS — R740 Nonspecific elevation of levels of transaminase and lactic acid dehydrogenase [LDH]: Secondary | ICD-10-CM | POA: Diagnosis present

## 2018-01-26 DIAGNOSIS — A419 Sepsis, unspecified organism: Secondary | ICD-10-CM

## 2018-01-26 DIAGNOSIS — I959 Hypotension, unspecified: Secondary | ICD-10-CM | POA: Diagnosis present

## 2018-01-26 DIAGNOSIS — J449 Chronic obstructive pulmonary disease, unspecified: Secondary | ICD-10-CM | POA: Diagnosis present

## 2018-01-26 DIAGNOSIS — D6959 Other secondary thrombocytopenia: Secondary | ICD-10-CM | POA: Diagnosis present

## 2018-01-26 DIAGNOSIS — Z7982 Long term (current) use of aspirin: Secondary | ICD-10-CM

## 2018-01-26 DIAGNOSIS — T451X5A Adverse effect of antineoplastic and immunosuppressive drugs, initial encounter: Secondary | ICD-10-CM | POA: Diagnosis present

## 2018-01-26 DIAGNOSIS — Z87442 Personal history of urinary calculi: Secondary | ICD-10-CM

## 2018-01-26 LAB — CBC WITH DIFFERENTIAL/PLATELET
Basophils Absolute: 0 10*3/uL (ref 0.0–0.1)
Basophils Relative: 0 %
EOS PCT: 0 %
Eosinophils Absolute: 0 10*3/uL (ref 0.0–0.7)
HCT: 28.2 % — ABNORMAL LOW (ref 39.0–52.0)
Hemoglobin: 9 g/dL — ABNORMAL LOW (ref 13.0–17.0)
LYMPHS ABS: 0.6 10*3/uL — AB (ref 0.7–4.0)
LYMPHS PCT: 7 %
MCH: 25.4 pg — AB (ref 26.0–34.0)
MCHC: 31.9 g/dL (ref 30.0–36.0)
MCV: 79.4 fL (ref 78.0–100.0)
MONO ABS: 1 10*3/uL (ref 0.1–1.0)
MONOS PCT: 12 %
Neutro Abs: 7.3 10*3/uL (ref 1.7–7.7)
Neutrophils Relative %: 81 %
PLATELETS: 62 10*3/uL — AB (ref 150–400)
RBC: 3.55 MIL/uL — ABNORMAL LOW (ref 4.22–5.81)
RDW: 14.9 % (ref 11.5–15.5)
WBC: 9 10*3/uL (ref 4.0–10.5)

## 2018-01-26 LAB — DIC (DISSEMINATED INTRAVASCULAR COAGULATION)PANEL
D-Dimer, Quant: 2.53 ug/mL-FEU — ABNORMAL HIGH (ref 0.00–0.50)
Fibrinogen: 503 mg/dL — ABNORMAL HIGH (ref 210–475)
INR: 1.18
Platelets: 51 10*3/uL — ABNORMAL LOW (ref 150–400)
Prothrombin Time: 14.9 seconds (ref 11.4–15.2)
Smear Review: NONE SEEN
aPTT: 36 seconds (ref 24–36)

## 2018-01-26 LAB — COMPREHENSIVE METABOLIC PANEL
ALBUMIN: 2.5 g/dL — AB (ref 3.5–5.0)
ALT: 16 U/L — AB (ref 17–63)
AST: 42 U/L — AB (ref 15–41)
Alkaline Phosphatase: 61 U/L (ref 38–126)
Anion gap: 8 (ref 5–15)
BILIRUBIN TOTAL: 0.7 mg/dL (ref 0.3–1.2)
BUN: 17 mg/dL (ref 6–20)
CHLORIDE: 102 mmol/L (ref 101–111)
CO2: 28 mmol/L (ref 22–32)
Calcium: 7.7 mg/dL — ABNORMAL LOW (ref 8.9–10.3)
Creatinine, Ser: 0.98 mg/dL (ref 0.61–1.24)
GFR calc Af Amer: >60 mL/min (ref >60)
GFR calc non Af Amer: >60 mL/min (ref >60)
GLUCOSE: 118 mg/dL — AB (ref 65–99)
POTASSIUM: 4.2 mmol/L (ref 3.5–5.1)
Sodium: 138 mmol/L (ref 135–145)
Total Protein: 7 g/dL (ref 6.5–8.1)

## 2018-01-26 LAB — INFLUENZA PANEL BY PCR (TYPE A & B)
Influenza A By PCR: NEGATIVE
Influenza B By PCR: NEGATIVE

## 2018-01-26 LAB — I-STAT CG4 LACTIC ACID, ED: Lactic Acid, Venous: 2.35 mmol/L (ref 0.5–1.9)

## 2018-01-26 LAB — TROPONIN I: Troponin I: <0.03 ng/mL (ref <0.03)

## 2018-01-26 LAB — BRAIN NATRIURETIC PEPTIDE
B Natriuretic Peptide: 465.9 pg/mL — ABNORMAL HIGH (ref 0.0–100.0)
B Natriuretic Peptide: 456.2 pg/mL — ABNORMAL HIGH (ref 0.0–100.0)

## 2018-01-26 MED ORDER — SODIUM CHLORIDE 0.9 % IV BOLUS
500.0000 mL | Freq: Once | INTRAVENOUS | Status: AC
Start: 2018-01-26 — End: 2018-01-26
  Administered 2018-01-26: 500 mL via INTRAVENOUS

## 2018-01-26 MED ORDER — ALBUTEROL SULFATE (2.5 MG/3ML) 0.083% IN NEBU
2.5000 mg | INHALATION_SOLUTION | Freq: Four times a day (QID) | RESPIRATORY_TRACT | Status: DC | PRN
  Administered 2018-01-29: 2.5 mg via RESPIRATORY_TRACT
  Filled 2018-01-26: qty 3

## 2018-01-26 MED ORDER — ENOXAPARIN SODIUM 30 MG/0.3ML ~~LOC~~ SOLN
30.0000 mg | SUBCUTANEOUS | Status: DC
  Administered 2018-01-26 – 2018-01-28 (×3): 30 mg via SUBCUTANEOUS
  Filled 2018-01-26 (×3): qty 0.3

## 2018-01-26 MED ORDER — DEXAMETHASONE 4 MG PO TABS
20.0000 mg | ORAL_TABLET | ORAL | Status: DC
  Administered 2018-01-26: 20 mg via ORAL
  Filled 2018-01-26: qty 5

## 2018-01-26 MED ORDER — SODIUM CHLORIDE 0.9 % IV SOLN
INTRAVENOUS | Status: AC
  Administered 2018-01-27 – 2018-01-28 (×2): via INTRAVENOUS

## 2018-01-26 MED ORDER — UMECLIDINIUM-VILANTEROL 62.5-25 MCG/INH IN AEPB
1.0000 | INHALATION_SPRAY | Freq: Every day | RESPIRATORY_TRACT | Status: DC
  Administered 2018-01-28 – 2018-01-29 (×2): 1 via RESPIRATORY_TRACT
  Filled 2018-01-26: qty 14

## 2018-01-26 MED ORDER — ACETAMINOPHEN 650 MG RE SUPP: 650.0000 mg | Freq: Four times a day (QID) | RECTAL | Status: DC | PRN

## 2018-01-26 MED ORDER — ONDANSETRON HCL 4 MG PO TABS: 4.0000 mg | ORAL_TABLET | Freq: Four times a day (QID) | ORAL | Status: DC | PRN

## 2018-01-26 MED ORDER — VANCOMYCIN HCL IN DEXTROSE 1-5 GM/200ML-% IV SOLN
1000.0000 mg | Freq: Once | INTRAVENOUS | Status: AC
  Administered 2018-01-26: 1000 mg via INTRAVENOUS
  Filled 2018-01-26: qty 200

## 2018-01-26 MED ORDER — OXYCODONE HCL 5 MG PO TABS
5.0000 mg | ORAL_TABLET | ORAL | Status: DC | PRN
  Administered 2018-01-28 – 2018-01-29 (×2): 5 mg via ORAL
  Filled 2018-01-26 (×2): qty 1

## 2018-01-26 MED ORDER — ACYCLOVIR 400 MG PO TABS
400.0000 mg | ORAL_TABLET | Freq: Two times a day (BID) | ORAL | Status: DC
  Administered 2018-01-26 – 2018-01-29 (×6): 400 mg via ORAL
  Filled 2018-01-26 (×6): qty 1

## 2018-01-26 MED ORDER — SODIUM CHLORIDE 0.9 % IV SOLN
2.0000 g | Freq: Two times a day (BID) | INTRAVENOUS | Status: DC
  Administered 2018-01-27 – 2018-01-29 (×5): 2 g via INTRAVENOUS
  Filled 2018-01-26 (×7): qty 2

## 2018-01-26 MED ORDER — ONDANSETRON HCL 4 MG/2ML IJ SOLN: 4.0000 mg | Freq: Four times a day (QID) | INTRAMUSCULAR | Status: DC | PRN

## 2018-01-26 MED ORDER — SODIUM CHLORIDE 0.9 % IV SOLN
2.0000 g | Freq: Once | INTRAVENOUS | Status: AC
  Administered 2018-01-26: 2 g via INTRAVENOUS
  Filled 2018-01-26: qty 2

## 2018-01-26 MED ORDER — METRONIDAZOLE IN NACL 5-0.79 MG/ML-% IV SOLN
500.0000 mg | Freq: Three times a day (TID) | INTRAVENOUS | Status: DC
  Administered 2018-01-26 – 2018-01-27 (×2): 500 mg via INTRAVENOUS
  Filled 2018-01-26 (×2): qty 100

## 2018-01-26 MED ORDER — ASPIRIN EC 325 MG PO TBEC
325.0000 mg | DELAYED_RELEASE_TABLET | Freq: Every day | ORAL | Status: DC
  Administered 2018-01-27 – 2018-01-29 (×3): 325 mg via ORAL
  Filled 2018-01-26 (×3): qty 1

## 2018-01-26 MED ORDER — TADALAFIL (PAH) 20 MG PO TABS
40.0000 mg | ORAL_TABLET | Freq: Every day | ORAL | Status: DC
  Administered 2018-01-27 – 2018-01-29 (×3): 40 mg via ORAL
  Filled 2018-01-26 (×3): qty 2

## 2018-01-26 MED ORDER — OXYCODONE HCL 5 MG PO TABS
5.0000 mg | ORAL_TABLET | Freq: Once | ORAL | Status: AC
  Administered 2018-01-26: 5 mg via ORAL
  Filled 2018-01-26: qty 1

## 2018-01-26 MED ORDER — POLYETHYLENE GLYCOL 3350 17 G PO PACK: 17.0000 g | PACK | Freq: Every day | ORAL | Status: DC | PRN

## 2018-01-26 MED ORDER — ALBUTEROL SULFATE HFA 108 (90 BASE) MCG/ACT IN AERS: 2.0000 | INHALATION_SPRAY | Freq: Four times a day (QID) | RESPIRATORY_TRACT | Status: DC | PRN

## 2018-01-26 MED ORDER — VANCOMYCIN HCL 10 G IV SOLR
1250.0000 mg | INTRAVENOUS | Status: DC
  Filled 2018-01-26: qty 1250

## 2018-01-26 MED ORDER — ACETAMINOPHEN 325 MG PO TABS
650.0000 mg | ORAL_TABLET | Freq: Four times a day (QID) | ORAL | Status: DC | PRN
Start: 2018-01-26 — End: 2018-01-29
  Administered 2018-01-29: 650 mg via ORAL
  Filled 2018-01-26: qty 2

## 2018-01-26 NOTE — Progress Notes (Signed)
Pharmacy Antibiotic Note  Sylvestre Rathgeber is a 57 y.o. male admitted on 01/26/2018 with pneumonia.  PMH significant for multiple myeloma (on Velcade), O2 dependence & pulmonary hypertension.  CXR + PNA.  Pharmacy has been consulted for Vancomycin dosing.  Initial doses given in ED.   01/26/2018:  Afebrile  WBC 9, LA 2.35 (elevated)  Renal function at patient's baseline, CrCl ~31m/min  Plan: Vancomycin 12558mIV q48h (goal AUC 400-500) Cefepime 2gm IV q12h per MD Monitor renal function and cx data  Consider check MRSA PCR    Temp (24hrs), Avg:99 F (37.2 C), Min:99 F (37.2 C), Max:99 F (37.2 C)  Recent Labs  Lab 01/26/18 1445 01/26/18 1448  WBC 9.0  --   CREATININE 0.98  --   LATICACIDVEN  --  2.35*    CrCl cannot be calculated (Unknown ideal weight.).    No Known Allergies  Antimicrobials this admission: 3/25 Vancomycin >>  3/25 Cefepime >>   Dose adjustments this admission:  Microbiology results: 3/25 BCx:  3/25 Influenza PCR 3/25 HIV: MRSA PCR:   Thank you for allowing pharmacy to be a part of this patient's care.  LiBiagio Borg/25/2019 4:34 PM

## 2018-01-26 NOTE — H&P (Signed)
History and Physical    Duvall Comes FSF:[id number] DOB: 02/21/1961 DOA: 01/26/2018  PCP: Patient, No Pcp Per   Patient coming from: Home    Chief Complaint: Shortness of breath, presyncope, abdominal pain  HPI: [name] [name] is a 57 y.o. male with medical history significant of pulmonary arterial hypertension with PA peak pressure of 59, moderate RV dilatation by echo On 07/01/2017 and wedge of 6 and PVR of 8.1 Wood units by right heart cath on 06/24/2016, R ISS stage II multiple myeloma status post 3 cycles of lenalidomide bortezomib and dexamethasone and Gold stage III COPD by PFTs in 02/16/2016 with no smoking history as well as a history of left pleural empyema requiring VATS in 2016 who comes in with increasing dyspnea on exertion and syncopal episode.  Patient reports he was doing well until today when he woke up quite fatigued.  He also reports some dry cough with no sputum production.  He reports increasing dyspnea on exertion and some tightness over the mid sternum/epigastric area.  He was walking today and he had a presyncopal episode where it appeared the world darkened around his eyes.  He also reports a headache that is primarily bitemporal.he has not woken up with a headache.  He has not had any fevers, nausea, vomiting, diarrhea, abdominal pain, lower extremity edema, orthopnea, paroxysmal nocturnal dyspnea.  He has not had any sick contacts. Patient presented for chemotherapy on 01/19/2018 for cycle 3 was noted to have progressive dyspnea that was worse with exertion and some crampy epigastric/abdominal pain.  Patient was given chemotherapy and Lasix with chemotherapy.  A CT was ordered as an outpatient which apparently showed some colitis.  Patient was apparently started on metronidazole for this.  He has been taking it without much improvement in the pain.  ED Course: In the ED vitals were notable for some hypoxia and some tachypnea.  Labs are notable for a mildly elevated lactic acid of 2.35.   CBC showed a normal white blood cell count and platelets of 62.  Hemoglobin was 9.0 and hematocrit was 28.2.  CMP showed low albumin and mildly elevated AST.  Chest x-ray showed right-sided pneumonia.  Noncontrasted head CT was negative.  BNP was 465.  Review of Systems: As per HPI otherwise 10 point review of systems negative.    Past Medical History:  Diagnosis Date  . Hypertension   . Kidney stones   . Pulmonary hypertension ([place])     Past Surgical History:  Procedure Laterality Date  . CARDIAC CATHETERIZATION N/A 01/18/2016   Procedure: Right Heart Cath;  Surgeon: Jolaine Artist, MD;  Location: [facility];  Service: Cardiovascular;  Laterality: N/A;  . CARDIAC CATHETERIZATION N/A 06/24/2016   Procedure: Right Heart Cath;  Surgeon: Jolaine Artist, MD;  Location: [facility];  Service: Cardiovascular;  Laterality: N/A;  . KIDNEY SURGERY    . VIDEO ASSISTED THORACOSCOPY (VATS)/DECORTICATION Left 2003  . VIDEO ASSISTED THORACOSCOPY (VATS)/DECORTICATION Left 10/02/2015   Procedure: VIDEO ASSISTED THORACOSCOPY (VATS)/DECORTICATION and drainage of chronic empyena;  Surgeon: [name], MD;  Location: [facility];  Service: Thoracic;  Laterality: Left;  Marland Kitchen VIDEO BRONCHOSCOPY N/A 10/02/2015   Procedure: VIDEO BRONCHOSCOPY;  Surgeon: [name], MD;  Location: [facility];  Service: Thoracic;  Laterality: N/A;     reports that he has never smoked. He has never used smokeless tobacco. He reports that he drinks alcohol. He reports that he does not use drugs.  No Known Allergies  Family History  Problem Relation Age of Onset  . Hypertension Other    Unacceptable: Noncontributory, unremarkable, or negative. Acceptable: Family history reviewed and not pertinent (If you reviewed it)  Prior to Admission medications   Medication Sig Start Date End Date Taking? Authorizing Provider  acyclovir (ZOVIRAX) 400 MG tablet Take 1 tablet (400 mg total) by mouth 2 (two) times  daily. 11/08/17   Brunetta Genera, MD  albuterol (PROVENTIL HFA;VENTOLIN HFA) 108 (90 Base) MCG/ACT inhaler Inhale 2 puffs into the lungs every 6 (six) hours as needed for wheezing or shortness of breath. 07/28/17   Tanda Rockers, MD  aspirin EC 325 MG EC tablet Take 1 tablet (325 mg total) by mouth daily. 11/09/16   Rai, Ripudeep K, MD  dexamethasone (DECADRON) 4 MG tablet Take 5 tablets (20 mg total) by mouth once a week. 11/14/17   Brunetta Genera, MD  lenalidomide (REVLIMID) 25 MG capsule Take 1 capsule (25 mg total) by mouth daily for 14 days. 01/23/18 02/06/18  Ardath Sax, MD  metroNIDAZOLE (FLAGYL) 500 MG tablet Take 1 tablet (500 mg total) by mouth 3 (three) times daily for 10 days. 01/25/18 02/04/18  Ardath Sax, MD  ondansetron (ZOFRAN) 8 MG tablet Take 1 tablet (8 mg total) by mouth 2 (two) times daily as needed (Nausea or vomiting). 11/08/17   Brunetta Genera, MD  oxyCODONE 10 MG TABS Take 0.5 tablets (5 mg total) by mouth every 4 (four) hours as needed for up to 21 days for severe pain. 01/12/18 02/02/18  Ardath Sax, MD  OXYGEN 2lpm BEDTIME ONLY    [provider]  tadalafil, PAH, (ADCIRCA) 20 MG tablet Take 2 tablets (40 mg total) by mouth daily. 08/26/17   Juanito Doom, MD  umeclidinium-vilanterol (ANORO ELLIPTA) 62.5-25 MCG/INH AEPB Inhale 1 puff into the lungs daily. 08/26/17   Juanito Doom, MD    Physical Exam: Vitals:   01/26/18 1352 01/26/18 1354  BP: 106/80   Pulse: 86   Resp: 20   Temp: 99 F (37.2 C)   TempSrc: Oral   SpO2: (!) 72% 94%    Constitutional: NAD, calm, comfortable Vitals:   01/26/18 1352 01/26/18 1354  BP: 106/80   Pulse: 86   Resp: 20   Temp: 99 F (37.2 C)   TempSrc: Oral   SpO2: (!) 72% 94%   Eyes: Anicteric sclera ENMT: Dry mucous membranes, poor dentition Neck: normal, supple Respiratory: No lung sounds at left base, over right lung noted to have coarse breath sounds with scattered rhonchi and  some dry crackles, no increased work of breathing.  Cardiovascular: Regular rate and rhythm, prominent second heart sound with 2 out of 6 systolic murmur heard best at third intercostal space Abdomen: Epigastric tenderness to palpation, diminished bowel sounds, no organomegaly Musculoskeletal: No lower extremity edema Skin: no rashes on visible skin Neurologic: Cranial nerves grossly intact, moving all extremities 4.  Psychiatric: Normal judgment and insight. Alert and oriented x 3. Normal mood.    Labs on Admission: I have personally reviewed following labs and imaging studies  CBC: Recent Labs  Lab 01/26/18 1445  WBC 9.0  NEUTROABS 7.3  HGB 9.0*  HCT 28.2*  MCV 79.4  PLT 62*   Basic Metabolic Panel: Recent Labs  Lab 01/26/18 1445  NA 138  K 4.2  CL 102  CO2 28  GLUCOSE 118*  BUN 17  CREATININE 0.98  CALCIUM 7.7*   GFR: CrCl cannot be  calculated (Unknown ideal weight.). Liver Function Tests: Recent Labs  Lab 01/26/18 1445  AST 42*  ALT 16*  ALKPHOS 61  BILITOT 0.7  PROT 7.0  ALBUMIN 2.5*   No results for input(s): LIPASE, AMYLASE in the last 168 hours. No results for input(s): AMMONIA in the last 168 hours. Coagulation Profile: No results for input(s): INR, PROTIME in the last 168 hours. Cardiac Enzymes: Recent Labs  Lab 01/26/18 1445  TROPONINI <0.03   BNP (last 3 results) No results for input(s): PROBNP in the last 8760 hours. HbA1C: No results for input(s): HGBA1C in the last 72 hours. CBG: No results for input(s): GLUCAP in the last 168 hours. Lipid Profile: No results for input(s): CHOL, HDL, LDLCALC, TRIG, CHOLHDL, LDLDIRECT in the last 72 hours. Thyroid Function Tests: No results for input(s): TSH, T4TOTAL, FREET4, T3FREE, THYROIDAB in the last 72 hours. Anemia Panel: No results for input(s): VITAMINB12, FOLATE, FERRITIN, TIBC, IRON, RETICCTPCT in the last 72 hours. Urine analysis:    Component Value Date/Time   COLORURINE YELLOW  12/22/2017 [place] 12/22/2017 1323   LABSPEC 1.011 12/22/2017 1323   PHURINE 8.0 12/22/2017 1323   GLUCOSEU NEGATIVE 12/22/2017 1323   HGBUR NEGATIVE 12/22/2017 [place] 12/22/2017 1323   KETONESUR NEGATIVE 12/22/2017 1323   PROTEINUR NEGATIVE 12/22/2017 1323   UROBILINOGEN 0.2 11/11/2012 0145   NITRITE NEGATIVE 12/22/2017 1323   LEUKOCYTESUR NEGATIVE 12/22/2017 1323    Radiological Exams on Admission: Dg Chest 2 View  Result Date: 01/26/2018 CLINICAL DATA:  Acute onset shortness of breath today. Weakness, dizziness, and blurred vision. Undergoing chemotherapy for multiple myeloma. EXAM: CHEST - 2 VIEW COMPARISON:  CT on 01/12/2018 and chest radiograph on 12/11/2017. FINDINGS: Heart size is stable. Chronic left-sided pleural thickening again noted. No evidence of pneumothorax. New airspace opacity is seen in the inferior aspect of the right upper lobe, suspicious for pneumonia. Enlargement of pulmonary arteries is seen, consistent with pulmonary arterial hypertension. IMPRESSION: New right upper lobe airspace opacity, suspicious for pneumonia. Stable left pleural thickening. Stable pulmonary arterial enlargement, consistent pulmonary artery hypertension. Electronically Signed   By: Earle Gell M.D.   On: 01/26/2018 15:40   Ct Head Wo Contrast  Result Date: 01/26/2018 CLINICAL DATA:  Severe headache, blurred vision EXAM: CT HEAD WITHOUT CONTRAST TECHNIQUE: Contiguous axial images were obtained from the base of the skull through the vertex without intravenous contrast. COMPARISON:  None FINDINGS: Brain: No acute intracranial abnormality. Specifically, no hemorrhage, hydrocephalus, mass lesion, acute infarction, or significant intracranial injury. Vascular: No hyperdense vessel or unexpected calcification. Skull: No acute calvarial abnormality. Sinuses/Orbits: Mucosal thickening in the right ethmoid air cells. Near complete opacification of the right maxillary  sinus. Mastoid air cells are clear. Other: None IMPRESSION: No acute intracranial abnormality. Electronically Signed   By: Rolm Baptise M.D.   On: 01/26/2018 15:21    EKG: Independently reviewed. Poor R wave progression, inverted T waves in precordial leads  Assessment/Plan Active Problems:   Pulmonary HTN (HCC)   COPD GOLD IV criteria but never smoked    Chronic respiratory failure with hypoxia ([name])   Community acquired pneumonia   Multiple myeloma ([place])   #) Acute respiratory failure secondary to community-acquired pneumonia: Most likely cause for his symptoms is a community-acquired pneumonia likely in the setting of immunosuppression after getting chemotherapy.  He likely cannot generate much of a fever response her white blood cell response. -Flu PCR -IV cefepime, metronidazole and vancomycin started 01/26/2018 -Follow-up  blood cultures drawn 01/26/2018 - Continue oxygen, would be very hesitant to wean it as this would likely exacerbate his underlying pulmonary hypertension -Gentle IV hydration  #) Abdominal pain/epigastric pain: Patient's symptoms appear to be mostly epigastric in nature.  He cannot provide much of a history for me however on chart review patient apparently was diagnosed with colitis and started on just metronidazole for treatment. -Continue IV cefepime, metronidazole and vancomycin  #) Dyspnea on exertion/presyncope: Suspect that this is almost certainly related to the infection with possibly worsening or exacerbating of his pulmonary arterial hypertension.  His last echo is several months ago in 2018.  His last cardiac catheterization is over 2 years ago.  He does not have any signs of right-sided failure including no worsening edema or ascites on exam however this dyspnea on exertion is certainly concerning. -Echo -We will consider CTA if high suspicion remains for PE however per chart review patient's oncologist had ordered a CTA however I cannot find the results  of this. -PRN oxygen -Currently getting gentle hydration as he appears dry however we will be very careful with this - Troponin negative, no acute ST segment changes on EKG, will hold on further ischemic workup at this time  #) Thrombocytopenia: Patient is noted to be quite thrombocytopenic compared to even a week ago.  His platelets are in the 60s.  He is a little bit more anemic as well.  His white count is stable.  It is unclear if this is related to chemotherapy however in the setting of an acute infection would raise concern for possible thrombotic microangiopathy -DIC panel  #) Anemia: This appears to be stable.  Patient had hemoglobin approximately 10 a few days ago and has not had any overt bleeding.  Suspect likely due to both bone marrow suppression from chemotherapy and bone marrow infiltration from his multiple adenoma. -Monitor  #) Gold stage IV COPD: Unclear why patient has such severe COPD with no history of smoking.  Suspect environmental exposure. -Continue home LIMA/L ABA -Continue PRN duo nebs  #) Pulmonary arterial hypertension: Likely related to above COPD/emphysematous disease. - Continue oxygen -Continue tadalafil 40 mg daily  #) Multiple myeloma status post 3 cycles of chemotherapy: Exline-continue acyclovir 400 mg twice daily prophylaxis -Continue dexamethasone 20 mg weekly - We will hold on lenalidomide 25 mg daily as he is off this cycle and he might be infected  Fluids: Gentle IV fluids Electrolytes: Monitor and supplement Nutrition: Regular diet  Prophylaxis: Enoxaparin until platelets fall below 50  Disposition: Pending resolution of respiratory status and evaluation of lab abnormalities Extent full code   Cristy Folks MD [facility]  If 7PM-7AM, please contact night-coverage [email or web address] Password [facility]  01/26/2018, 4:17 PM

## 2018-01-26 NOTE — Telephone Encounter (Signed)
Received call from friend of pt stating "He has some appointments today but he is very short of breath and tired all of a sudden." Friend states that pt can not finish answering questions without stopping to take breaths. Per friend pt unable to walk to car without taking multiple breaks but has not had any fevers. Per friend she was unsure whether to take him to emergency room or keep appointments. Upon review of chart and talking with Aldona Bar, RN from [facility]; pt to be sent to emergency room due to sudden onset of shortness of breath and fatigue. Instructions given to friend to take pt to closest emergency room and let staff know he is receiving chemotherapy. Understanding verbalized by friend who had no further questions.

## 2018-01-26 NOTE — ED Notes (Signed)
Report given to FLOOR RN 

## 2018-01-26 NOTE — ED Notes (Signed)
Bed: IZ12 Expected date:  Expected time:  Means of arrival:  Comments: [name] [name]

## 2018-01-26 NOTE — ED Provider Notes (Signed)
[facility] DEPT Provider Note   CSN: [id number] Arrival date & time: 01/26/18  1325     History   Chief Complaint Chief Complaint  Patient presents with  . Weakness    HPI Kayveon Lennartz is a 57 y.o. male.  HPI 57 year old male with a history of hypertension, pulmonary hypertension, multiple myeloma and prior empyema presents with shortness of breath and headache.  History is somewhat limited by the patient's language barrier.  However he has been having worsening shortness of breath quiring more oxygen.  Typically he uses oxygen only at night but now is using it during the day.  While walking he developed a headache and lightheadedness and blurry vision.  He was also more short of breath than normal.  His O2 sats dropped according to friend at the bedside.  Now his headache is slowly improving.  He has not taken his oxycodone today.  He denies any fevers.  He has been having shortness of breath and abdominal pain but the symptoms are worsening slowly over the last couple weeks.  Past Medical History:  Diagnosis Date  . Hypertension   . Kidney stones   . Pulmonary hypertension [facility])     Patient Active Problem List   Diagnosis Date Noted  . Counseling regarding advanced care planning and goals of care 11/08/2017  . Multiple myeloma ([place]) 11/07/2017  . Community acquired pneumonia   . Chronic respiratory failure with hypoxia ([place]) 03/26/2016  . COPD GOLD IV criteria but never smoked  03/11/2016  . Pulmonary HTN ([place])   . Hyperkalemia   . Diarrhea 09/27/2015  . Chest pain 09/27/2015  . Unintentional weight loss 09/27/2015  . Abdominal cramps 09/26/2015  . Pleural effusion, left 09/26/2015    Past Surgical History:  Procedure Laterality Date  . CARDIAC CATHETERIZATION N/A 01/18/2016   Procedure: Right Heart Cath;  Surgeon: Jolaine Artist, MD;  Location: [facility] LAB;  Service: Cardiovascular;  Laterality: N/A;  . CARDIAC CATHETERIZATION  N/A 06/24/2016   Procedure: Right Heart Cath;  Surgeon: Jolaine Artist, MD;  Location: [place] [facility];  Service: Cardiovascular;  Laterality: N/A;  . KIDNEY SURGERY    . VIDEO ASSISTED THORACOSCOPY (VATS)/DECORTICATION Left 2003  . VIDEO ASSISTED THORACOSCOPY (VATS)/DECORTICATION Left 10/02/2015   Procedure: VIDEO ASSISTED THORACOSCOPY (VATS)/DECORTICATION and drainage of chronic empyena;  Surgeon: [name], MD;  Location: [facility];  Service: Thoracic;  Laterality: Left;  Marland Kitchen VIDEO BRONCHOSCOPY N/A 10/02/2015   Procedure: VIDEO BRONCHOSCOPY;  Surgeon: [name], MD;  Location: [facility] OR;  Service: Thoracic;  Laterality: N/A;        Home Medications    Prior to Admission medications   Medication Sig Start Date End Date Taking? Authorizing Provider  albuterol (PROVENTIL HFA;VENTOLIN HFA) 108 (90 Base) MCG/ACT inhaler Inhale 2 puffs into the lungs every 6 (six) hours as needed for wheezing or shortness of breath. 07/28/17  Yes Tanda Rockers, MD  aspirin EC 325 MG EC tablet Take 1 tablet (325 mg total) by mouth daily. 11/09/16  Yes Rai, Ripudeep K, MD  metroNIDAZOLE (FLAGYL) 500 MG tablet Take 1 tablet (500 mg total) by mouth 3 (three) times daily for 10 days. 01/25/18 02/04/18 Yes Perlov, Marinell Blight, MD  oxyCODONE 10 MG TABS Take 0.5 tablets (5 mg total) by mouth every 4 (four) hours as needed for up to 21 days for severe pain. 01/12/18 02/02/18 Yes Perlov, Marinell Blight, MD  tadalafil, PAH, (ADCIRCA) 20 MG tablet  Take 2 tablets (40 mg total) by mouth daily. 08/26/17  Yes McQuaid, Ronie Spies, MD  umeclidinium-vilanterol (ANORO ELLIPTA) 62.5-25 MCG/INH AEPB Inhale 1 puff into the lungs daily. 08/26/17  Yes Juanito Doom, MD  acyclovir (ZOVIRAX) 400 MG tablet Take 1 tablet (400 mg total) by mouth 2 (two) times daily. Patient not taking: Reported on 01/26/2018 11/08/17   Brunetta Genera, MD  dexamethasone (DECADRON) 4 MG tablet Take 5 tablets (20 mg total) by mouth once a week. 11/14/17    Brunetta Genera, MD  lenalidomide (REVLIMID) 25 MG capsule Take 1 capsule (25 mg total) by mouth daily for 14 days. 01/23/18 02/06/18  Ardath Sax, MD  ondansetron (ZOFRAN) 8 MG tablet Take 1 tablet (8 mg total) by mouth 2 (two) times daily as needed (Nausea or vomiting). 11/08/17   Brunetta Genera, MD  OXYGEN 2lpm BEDTIME ONLY    [provider]    Family History Family History  Problem Relation Age of Onset  . Hypertension Other     Social History Social History   Tobacco Use  . Smoking status: Never Smoker  . Smokeless tobacco: Never Used  Substance Use Topics  . Alcohol use: Yes  . Drug use: No     Allergies   Patient has no known allergies.   Review of Systems Review of Systems  Constitutional: Negative for fever.  Eyes: Positive for visual disturbance. Negative for photophobia.  Respiratory: Positive for cough and shortness of breath.   Cardiovascular: Negative for chest pain.  Gastrointestinal: Positive for abdominal pain. Negative for vomiting.  Neurological: Positive for weakness, light-headedness and headaches.  All other systems reviewed and are negative.    Physical Exam Updated Vital Signs BP 100/65   Pulse 73   Temp 99 F (37.2 C) (Oral)   Resp (!) 21   SpO2 96%   Physical Exam  Constitutional: He is oriented to person, place, and time. He appears well-developed and well-nourished.  HENT:  Head: Normocephalic and atraumatic.  Right Ear: External ear normal.  Left Ear: External ear normal.  Nose: Nose normal.  Eyes: Pupils are equal, round, and reactive to light. EOM are normal. Right eye exhibits no discharge. Left eye exhibits no discharge.  Neck: Normal range of motion. Neck supple.  Cardiovascular: Normal rate, regular rhythm and normal heart sounds.  Pulmonary/Chest: Effort normal. He has rales.  Abdominal: Soft. There is no tenderness.  Musculoskeletal: He exhibits no edema.  Neurological: He is alert and oriented to  person, place, and time.  CN 3-12 grossly intact. 5/5 strength in all 4 extremities. Grossly normal sensation. Normal finger to nose.   Skin: Skin is warm and dry.  Nursing note and vitals reviewed.    ED Treatments / Results  Labs (all labs ordered are listed, but only abnormal results are displayed) Labs Reviewed  COMPREHENSIVE METABOLIC PANEL - Abnormal; Notable for the following components:      Result Value   Glucose, Bld 118 (*)    Calcium 7.7 (*)    Albumin 2.5 (*)    AST 42 (*)    ALT 16 (*)    All other components within normal limits  CBC WITH DIFFERENTIAL/PLATELET - Abnormal; Notable for the following components:   RBC 3.55 (*)    Hemoglobin 9.0 (*)    HCT 28.2 (*)    MCH 25.4 (*)    Platelets 62 (*)    Lymphs Abs 0.6 (*)    All other components  within normal limits  BRAIN NATRIURETIC PEPTIDE - Abnormal; Notable for the following components:   B Natriuretic Peptide 465.9 (*)    All other components within normal limits  I-STAT CG4 LACTIC ACID, ED - Abnormal; Notable for the following components:   Lactic Acid, Venous 2.35 (*)    All other components within normal limits  CULTURE, BLOOD (ROUTINE X 2)  CULTURE, BLOOD (ROUTINE X 2)  TROPONIN I  I-STAT CG4 LACTIC ACID, ED    EKG EKG Interpretation  Date/Time:  Monday January 26 2018 13:54:14 EDT Ventricular Rate:  82 PR Interval:    QRS Duration: 87 QT Interval:  378 QTC Calculation: 442 R Axis:   114 Text Interpretation:  Sinus rhythm Right axis deviation Borderline low voltage, extremity leads Abnormal T, consider ischemia, anterior leads lateral t wave chagnes are new since last tracing Confirmed by Dorie Rank [phone]) on 01/26/2018 2:26:38 PM   Radiology Dg Chest 2 View  Result Date: 01/26/2018 CLINICAL DATA:  Acute onset shortness of breath today. Weakness, dizziness, and blurred vision. Undergoing chemotherapy for multiple myeloma. EXAM: CHEST - 2 VIEW COMPARISON:  CT on 01/12/2018 and chest radiograph  on 12/11/2017. FINDINGS: Heart size is stable. Chronic left-sided pleural thickening again noted. No evidence of pneumothorax. New airspace opacity is seen in the inferior aspect of the right upper lobe, suspicious for pneumonia. Enlargement of pulmonary arteries is seen, consistent with pulmonary arterial hypertension. IMPRESSION: New right upper lobe airspace opacity, suspicious for pneumonia. Stable left pleural thickening. Stable pulmonary arterial enlargement, consistent pulmonary artery hypertension. Electronically Signed   By: Earle Gell M.D.   On: 01/26/2018 15:40   Ct Head Wo Contrast  Result Date: 01/26/2018 CLINICAL DATA:  Severe headache, blurred vision EXAM: CT HEAD WITHOUT CONTRAST TECHNIQUE: Contiguous axial images were obtained from the base of the skull through the vertex without intravenous contrast. COMPARISON:  None FINDINGS: Brain: No acute intracranial abnormality. Specifically, no hemorrhage, hydrocephalus, mass lesion, acute infarction, or significant intracranial injury. Vascular: No hyperdense vessel or unexpected calcification. Skull: No acute calvarial abnormality. Sinuses/Orbits: Mucosal thickening in the right ethmoid air cells. Near complete opacification of the right maxillary sinus. Mastoid air cells are clear. Other: None IMPRESSION: No acute intracranial abnormality. Electronically Signed   By: Rolm Baptise M.D.   On: 01/26/2018 15:21    Procedures .Critical Care Performed by: Sherwood Gambler, MD Authorized by: Sherwood Gambler, MD   Critical care provider statement:    Critical care time (minutes):  30   Critical care time was exclusive of:  Separately billable procedures and treating other patients   Critical care was necessary to treat or prevent imminent or life-threatening deterioration of the following conditions:  Sepsis and respiratory failure   Critical care was time spent personally by me on the following activities:  Development of treatment plan with  patient or surrogate, discussions with consultants, evaluation of patient's response to treatment, examination of patient, obtaining history from patient or surrogate, ordering and performing treatments and interventions, ordering and review of laboratory studies, ordering and review of radiographic studies, pulse oximetry, re-evaluation of patient's condition and review of old charts   (including critical care time)  Medications Ordered in ED Medications  sodium chloride 0.9 % bolus 500 mL (500 mLs Intravenous Given 01/26/18 1605)  ceFEPIme (MAXIPIME) 2 g in sodium chloride 0.9 % 100 mL IVPB (2 g Intravenous New Bag/Given 01/26/18 1605)  vancomycin (VANCOCIN) IVPB 1000 mg/200 mL premix (1,000 mg Intravenous New Bag/Given 01/26/18 1605)  oxyCODONE (Oxy IR/ROXICODONE) immediate release tablet 5 mg (5 mg Oral Given 01/26/18 1604)     Initial Impression / Assessment and Plan / ED Course  I have reviewed the triage vital signs and the nursing notes.  Pertinent labs & imaging results that were available during my care of the patient were reviewed by me and considered in my medical decision making (see chart for details).     Patient's chest x-ray confirms pneumonia.  He was hypotensive on orthostatic vital signs but not hypotensive at rest.  He has signs of severe sepsis with elevated lactate but it is under 4.  He was given IV fluids and given broad antibiotics given he is on chemotherapy.  Hospitalist to admit.  Final Clinical Impressions(s) / ED Diagnoses   Final diagnoses:  HCAP (healthcare-associated pneumonia)  Severe sepsis [facility])    ED Discharge Orders    None       Sherwood Gambler, MD 01/26/18 1624

## 2018-01-26 NOTE — Progress Notes (Signed)
A consult was received from an ED physician for Vancomycin & Cefepime per pharmacy dosing.  The patient's profile has been reviewed for ht/wt/allergies/indication/available labs.   A one time order has been placed for Cefepime 2gm & Vancomycin 1gm.  Further antibiotics/pharmacy consults should be ordered by admitting physician if indicated.                       Thank you, Biagio Borg 01/26/2018  3:33 PM

## 2018-01-26 NOTE — ED Triage Notes (Signed)
Patient c/o generalized weakness, headache, dizziness, and bilateral blurred vision x2 weeks, worsening today. Hx bone cancer. Last chemo injection last Friday.

## 2018-01-27 ENCOUNTER — Inpatient Hospital Stay (HOSPITAL_COMMUNITY): Payer: Self-pay

## 2018-01-27 DIAGNOSIS — J869 Pyothorax without fistula: Secondary | ICD-10-CM

## 2018-01-27 DIAGNOSIS — K529 Noninfective gastroenteritis and colitis, unspecified: Secondary | ICD-10-CM

## 2018-01-27 DIAGNOSIS — J9621 Acute and chronic respiratory failure with hypoxia: Secondary | ICD-10-CM

## 2018-01-27 LAB — BASIC METABOLIC PANEL WITH GFR
Anion gap: 7 (ref 5–15)
BUN: 14 mg/dL (ref 6–20)
CO2: 26 mmol/L (ref 22–32)
Chloride: 103 mmol/L (ref 101–111)
GFR calc non Af Amer: >60 mL/min (ref >60)
Glucose, Bld: 154 mg/dL — ABNORMAL HIGH (ref 65–99)
Potassium: 4.3 mmol/L (ref 3.5–5.1)

## 2018-01-27 LAB — BASIC METABOLIC PANEL
Calcium: 7.3 mg/dL — ABNORMAL LOW (ref 8.9–10.3)
Creatinine, Ser: 0.78 mg/dL (ref 0.61–1.24)
GFR calc Af Amer: >60 mL/min (ref >60)
Sodium: 136 mmol/L (ref 135–145)

## 2018-01-27 LAB — ECHOCARDIOGRAM COMPLETE
Height: 60 in
Weight: 1552.04 [oz_av]

## 2018-01-27 LAB — CBC
HCT: 32.1 % — ABNORMAL LOW (ref 39.0–52.0)
Hemoglobin: 10.3 g/dL — ABNORMAL LOW (ref 13.0–17.0)
MCH: 25.6 pg — ABNORMAL LOW (ref 26.0–34.0)
MCHC: 32.1 g/dL (ref 30.0–36.0)
MCV: 79.7 fL (ref 78.0–100.0)
Platelets: 85 10*3/uL — ABNORMAL LOW (ref 150–400)
RBC: 4.03 MIL/uL — ABNORMAL LOW (ref 4.22–5.81)
RDW: 15.1 % (ref 11.5–15.5)
WBC: 7.2 10*3/uL (ref 4.0–10.5)

## 2018-01-27 LAB — HIV ANTIBODY (ROUTINE TESTING W REFLEX): HIV Screen 4th Generation wRfx: NONREACTIVE

## 2018-01-27 LAB — MRSA PCR SCREENING: MRSA by PCR: NEGATIVE

## 2018-01-27 MED ORDER — METRONIDAZOLE IN NACL 5-0.79 MG/ML-% IV SOLN
500.0000 mg | Freq: Three times a day (TID) | INTRAVENOUS | Status: DC
  Administered 2018-01-27 – 2018-01-29 (×6): 500 mg via INTRAVENOUS
  Filled 2018-01-27 (×6): qty 100

## 2018-01-27 MED ORDER — PSYLLIUM 95 % PO PACK
1.0000 | PACK | Freq: Every day | ORAL | Status: DC
  Administered 2018-01-27 – 2018-01-29 (×3): 1 via ORAL
  Filled 2018-01-27 (×3): qty 1

## 2018-01-27 NOTE — Consult Note (Signed)
IP PROGRESS NOTE Referring Provider: Dr Charlynne Cousins  Reason for Consult: Patient with multiple myeloma on active therapy with bortezomib and low-dose dexamethasone, admitted with suspected pneumonia and colitis.  Subjective:  [name] [name] is a 57 y.o. male with reasonably recent diagnosis of active multiple myeloma undergoing therapy with bortezomib and low-dose dexamethasone.  Over the past several visits, patient has been complaining of increasing shortness of breath, and heaviness/discomfort in the left upper quadrant of the abdomen especially when taking deep breaths or moving.  Initially, we have suspected pulmonary embolism, but imaging of the chest revealed none of that.  Initial chest imaging showed no evidence of progression of the chronic complex left pleural effusion, no changes in the lung at that time.  Repeat CT of the abdomen and pelvis was obtained on Friday due to persistent and worsening complaints of abdominal discomfort as well as diarrhea.  Imaging demonstrated thickening of the colon in the splenic flexure consistent with possible colitis.  Patient was started on metronidazole, but shortness of breath has progressed and patient presented to the emergency room last night.  On admission, patient was started on systemic antibiotics with cefepime and vancomycin in addition to metronidazole.  Since admission, patient has been afebrile with stable heart rate, but  periods of hypotension with nadir blood pressure of 87/56.  On presentation, had tachypnea with 25 respirations per minute which has improved significantly.  Continues to have stable abdominal discomfort at this point in time.   Objective: Vital signs in last 24 hours: Blood pressure (!) 87/56, pulse 63, temperature 97.8 F (36.6 C), temperature source Oral, resp. rate 18, height 5' (1.524 m), weight 97 lb (44 kg), SpO2 100 %.  Intake/Output from previous day: 03/25 0701 - 03/26 0700 In: 1110 [P.O.:360; I.V.:450; IV  Piggyback:300] Out: 300 [Urine:300]  Physical Exam:  Patient is awake, alert, oriented x3, but appears fatigued.  No obvious pain or respiratory distress. HEENT: Anicteric sclera, moist mucous membranes, no oral sores. Lungs: Decreased breath sounds in the dependent portions of both lungs.  Diffuse expiratory wheezing bilaterally. Cardiac: S1, S2, regular no murmurs Abdomen: Abdomen is soft, moderately tender diffusely without rebound. Extremities: No lower extremity swelling.    Lab Results: Recent Labs    01/26/18 1445 01/26/18 1735 01/27/18 0404  WBC 9.0  --  7.2  HGB 9.0*  --  10.3*  HCT 28.2*  --  32.1*  PLT 62* 51* 85*    BMET Recent Labs    01/26/18 1445 01/27/18 0404  NA 138 136  K 4.2 4.3  CL 102 103  CO2 28 26  GLUCOSE 118* 154*  BUN 17 14  CREATININE 0.98 0.78  CALCIUM 7.7* 7.3*    No results found for: CEA1  Studies/Results: Dg Chest 2 View  Result Date: 01/26/2018 CLINICAL DATA:  Acute onset shortness of breath today. Weakness, dizziness, and blurred vision. Undergoing chemotherapy for multiple myeloma. EXAM: CHEST - 2 VIEW COMPARISON:  CT on 01/12/2018 and chest radiograph on 12/11/2017. FINDINGS: Heart size is stable. Chronic left-sided pleural thickening again noted. No evidence of pneumothorax. New airspace opacity is seen in the inferior aspect of the right upper lobe, suspicious for pneumonia. Enlargement of pulmonary arteries is seen, consistent with pulmonary arterial hypertension. IMPRESSION: New right upper lobe airspace opacity, suspicious for pneumonia. Stable left pleural thickening. Stable pulmonary arterial enlargement, consistent pulmonary artery hypertension. Electronically Signed   By: Earle Gell M.D.   On: 01/26/2018 15:40   Ct Head Wo Contrast  Result Date: 01/26/2018 CLINICAL DATA:  Severe headache, blurred vision EXAM: CT HEAD WITHOUT CONTRAST TECHNIQUE: Contiguous axial images were obtained from the base of the skull through the  vertex without intravenous contrast. COMPARISON:  None FINDINGS: Brain: No acute intracranial abnormality. Specifically, no hemorrhage, hydrocephalus, mass lesion, acute infarction, or significant intracranial injury. Vascular: No hyperdense vessel or unexpected calcification. Skull: No acute calvarial abnormality. Sinuses/Orbits: Mucosal thickening in the right ethmoid air cells. Near complete opacification of the right maxillary sinus. Mastoid air cells are clear. Other: None IMPRESSION: No acute intracranial abnormality. Electronically Signed   By: Rolm Baptise M.D.   On: 01/26/2018 15:21    Medications: I have reviewed the patient's current medications.  Assessment/Plan: 57 y.o. with recent diagnosis of active multiple myeloma treated with bortezomib and low-dose dexamethasone induction.  Patient has extensive history of recurrent empyema, previously treated with pleurodesis.  Admitted to the hospital due to syncopal event in the context of progressive shortness of breath and abdominal pain.  CT scan of the abdomen obtained on Friday was consistent with colitis.  Patient was started on metronidazole yesterday.  Agree with the assessment and plan of the hospitalist service at this time: Antibiotic therapy with cefepime, metronidazole, and vancomycin.  Patient is at risk of atypical infection such as invasive fungus due to immunosuppressed state from his underlying malignancy as well as therapy.  If clinical situation is not improving significantly over 72 hours of admission, consider adding antifungal coverage with fluconazole or voriconazole.  Agree with assessment that patient syncope has likely relationship with severe pulmonary hypertension evidenced by dilation of the pulmonary arteries and increased BNP on admission.  At this time, we will hold patient systemic therapy until he demonstrated clinical improvement.  Okay to hold dexamethasone, but patient may need a stress dose steroid dosing with  prednisone.    LOS: 1 day   Ardath Sax, MD   01/27/2018, 12:03 PM

## 2018-01-27 NOTE — Progress Notes (Signed)
  Echocardiogram 2D Echocardiogram has been performed.  [name] [name] 01/27/2018, 10:41 AM

## 2018-01-27 NOTE — Progress Notes (Signed)
[facility] PROGRESS NOTE    Progress Note  [name] [name]  GUR:[id number] DOB: July 25, 1961 DOA: 01/26/2018 PCP: [name] [name]     Brief Narrative:   [name] [name] is an 57 y.o. male past medical history of pulmonary hypertension with a PA pressure of 59 with moderate RV dilation on echo on 07/01/2017, multiply myeloma status post 3 cycles, COPD Gold stage III and PFTs of 02/16/2016, also with a history of left pleural empyema requiring VATS in 2016 comes in with increased dyspnea on exertion and a syncopal episode.  Assessment/Plan:   Acute on chronic respiratory failure due to community-acquired pneumonia: She insulin multiple immunosuppressive agents. She was started empirically on IV cefepime, vancomycin. Influenza PCR negative.  Culture data is pending.  Abdominal epigastric pain likely due to colitis: CT scan done on 01/21/2018 show colonic inflammation likely infectious etiology.  No mass or obstruction. Continue IV Flagyl.  Syncope: Likely due to his infectious etiology in the setting of pulmonary arterial pressure. Tinea oxygen as needed.  Cardiac biomarkers negative, no acute ST segment changes.  No further workup indicated. He has had no events on telemetry.  Thrombocytopenia: Count is stable likely related to chemotherapy.  Normocytic anemia: Hemoglobin at baseline no signs of bleeding.  Likely due to chemotherapy. Pulmonary HTN ([place])  COPD GOLD IV criteria but never smoked  Continue inhalers seems to be stable.   Multiple myeloma (HCC) Continue acyclovir for prophylaxis, dexamethasone hold leflunomide.   DVT prophylaxis: lovenox Family Communication:none Disposition Plan/Barrier to D/C: home in 3-5 days Code Status:     Code Status Orders  (From admission, onward)        Start     Ordered   01/26/18 1832  Full code  Continuous     01/26/18 1831    Code Status History    Date Active Date Inactive Code Status Order ID Comments User Context   11/06/2016 0103 11/09/2016 1739 Full Code [id number]  Rise Patience, MD Inpatient   01/11/2016 2227 01/23/2016 1654 Full Code [id number]  Reubin Milan, MD Inpatient   10/02/2015 1505 10/20/2015 2127 Full Code [id number]  Arnoldo Lenis Inpatient   09/26/2015 0425 09/30/2015 2012 Full Code [id number]  Danford, Suann Larry, MD Inpatient        IV Access:    Peripheral IV   Procedures and diagnostic studies:   Dg Chest 2 View  Result Date: 01/26/2018 CLINICAL DATA:  Acute onset shortness of breath today. Weakness, dizziness, and blurred vision. Undergoing chemotherapy for multiple myeloma. EXAM: CHEST - 2 VIEW COMPARISON:  CT on 01/12/2018 and chest radiograph on 12/11/2017. FINDINGS: Heart size is stable. Chronic left-sided pleural thickening again noted. No evidence of pneumothorax. New airspace opacity is seen in the inferior aspect of the right upper lobe, suspicious for pneumonia. Enlargement of pulmonary arteries is seen, consistent with pulmonary arterial hypertension. IMPRESSION: New right upper lobe airspace opacity, suspicious for pneumonia. Stable left pleural thickening. Stable pulmonary arterial enlargement, consistent pulmonary artery hypertension. Electronically Signed   By: Earle Gell M.D.   On: 01/26/2018 15:40   Ct Head Wo Contrast  Result Date: 01/26/2018 CLINICAL DATA:  Severe headache, blurred vision EXAM: CT HEAD WITHOUT CONTRAST TECHNIQUE: Contiguous axial images were obtained from the base of the skull through the vertex without intravenous contrast. COMPARISON:  None FINDINGS: Brain: No acute intracranial abnormality. Specifically, no hemorrhage, hydrocephalus, mass lesion, acute infarction, or significant intracranial injury. Vascular: No hyperdense vessel or unexpected calcification. Skull: No acute  calvarial abnormality. Sinuses/Orbits: Mucosal thickening in the right ethmoid air cells. Near complete opacification of the right maxillary sinus.  Mastoid air cells are clear. Other: None IMPRESSION: No acute intracranial abnormality. Electronically Signed   By: Rolm Baptise M.D.   On: 01/26/2018 15:21     Medical Consultants:    None.  Anti-Infectives:   IV vancomycin, cefepime and Flagyl  Subjective:    [name] [name] he relates his breathing is much better.  He also relates his abdominal pain is improved he continues to have watery diarrhea.  Objective:    Vitals:   01/26/18 1545 01/26/18 1817 01/26/18 2137 01/27/18 0632  BP: 1'00/65 92/64 90/62 '$ (!) 87/56  Pulse: 73 66 62 63  Resp: (!) '21 20 18 18  '$ Temp:  98.2 F (36.8 C) 98.1 F (36.7 C) 97.8 F (36.6 C)  TempSrc:  Oral Oral Oral  SpO2: 96% 100% 100% 100%  Weight:  44 kg (97 lb)    Height:  5' (1.524 m)      Intake/Output Summary (Last 24 hours) at 01/27/2018 0926 Last data filed at 01/27/2018 0836 Gross [name] 24 hour  Intake 1470 ml  Output 300 ml  Net 1170 ml   Filed Weights   01/26/18 1817  Weight: 44 kg (97 lb)    Exam: General exam: In no acute distress. Respiratory system: Good air movement with crackles on the right side Cardiovascular system: S1 & S2 heard, RRR.  Gastrointestinal system: Abdomen is soft, with mild tenderness in the left lower quadrant no rebound or guarding.   Central nervous system: Alert and oriented. No focal neurological deficits. Extremities: No pedal edema. Skin: No rashes, lesions or ulcers Psychiatry: Judgement and insight appear normal. Mood & affect appropriate.    Data Reviewed:    Labs: Basic Metabolic Panel: Recent Labs  Lab 01/26/18 1445 01/27/18 0404  NA 138 136  K 4.2 4.3  CL 102 103  CO2 28 26  GLUCOSE 118* 154*  BUN 17 14  CREATININE 0.98 0.78  CALCIUM 7.7* 7.3*   GFR Estimated Creatinine Clearance: 64.9 mL/min (by C-G formula based on SCr of 0.78 mg/dL). Liver Function Tests: Recent Labs  Lab 01/26/18 1445  AST 42*  ALT 16*  ALKPHOS 61  BILITOT 0.7  PROT 7.0  ALBUMIN 2.5*   No results  for input(s): LIPASE, AMYLASE in the last 168 hours. No results for input(s): AMMONIA in the last 168 hours. Coagulation profile Recent Labs  Lab 01/26/18 1735  INR 1.18    CBC: Recent Labs  Lab 01/26/18 1445 01/26/18 1735 01/27/18 0404  WBC 9.0  --  7.2  NEUTROABS 7.3  --   --   HGB 9.0*  --  10.3*  HCT 28.2*  --  32.1*  MCV 79.4  --  79.7  PLT 62* 51* 85*   Cardiac Enzymes: Recent Labs  Lab 01/26/18 1445  TROPONINI <0.03   BNP (last 3 results) No results for input(s): PROBNP in the last 8760 hours. CBG: No results for input(s): GLUCAP in the last 168 hours. D-Dimer: Recent Labs    01/26/18 1735  DDIMER 2.53*   Hgb A1c: No results for input(s): HGBA1C in the last 72 hours. Lipid Profile: No results for input(s): CHOL, HDL, LDLCALC, TRIG, CHOLHDL, LDLDIRECT in the last 72 hours. Thyroid function studies: No results for input(s): TSH, T4TOTAL, T3FREE, THYROIDAB in the last 72 hours.  Invalid input(s): FREET3 Anemia work up: No results for input(s): VITAMINB12, FOLATE, FERRITIN, TIBC, IRON,  RETICCTPCT in the last 72 hours. Sepsis Labs: Recent Labs  Lab 01/26/18 1445 01/26/18 1448 01/27/18 0404  WBC 9.0  --  7.2  LATICACIDVEN  --  2.35*  --    Microbiology No results found for this or any previous visit (from the past 240 hour(s)).   Medications:   . acyclovir  400 mg Oral BID  . aspirin  325 mg Oral Daily  . dexamethasone  20 mg Oral Weekly  . enoxaparin (LOVENOX) injection  30 mg Subcutaneous Q24H  . tadalafil (PAH)  40 mg Oral Daily  . umeclidinium-vilanterol  1 puff Inhalation Daily   Continuous Infusions: . sodium chloride    . ceFEPime (MAXIPIME) IV Stopped (01/27/18 0102)  . metronidazole Stopped (01/27/18 0165)  . [START ON 01/28/2018] vancomycin      LOS: 1 day   Charlynne Cousins  [facility] Pager [phone]  *Please refer to [email or web address], password [email or web address] to get updated schedule on who will round on this patient, as  hospitalists switch teams weekly. If 7PM-7AM, please contact night-coverage at [email or web address], password [email or web address] for any overnight needs.  01/27/2018, 9:26 AM

## 2018-01-28 DIAGNOSIS — I272 Pulmonary hypertension, unspecified: Secondary | ICD-10-CM

## 2018-01-28 NOTE — Progress Notes (Signed)
[facility] PROGRESS NOTE    Progress Note  Day Deery  SWH:[id number] DOB: May 14, 1961 DOA: 01/26/2018 PCP: Patient, No Pcp Per     Brief Narrative:   [name] [name] is an 57 y.o. male past medical history of pulmonary hypertension with a PA pressure of 59 with moderate RV dilation on echo on 07/01/2017, multiply myeloma status post 3 cycles, COPD Gold stage III and PFTs of 02/16/2016, also with a history of left pleural empyema requiring VATS in 2016 comes in with increased dyspnea on exertion and a syncopal episode.  Assessment/Plan:   Acute on chronic respiratory failure due to community-acquired pneumonia: He is multiple immunosuppressive agents. Continue IV Vanco MRSA PCR was negative DC Vanco. Influenza PCR negative.  Culture data has remain negative. D-dimer is elevated likely due in the setting of colitis and pneumonia.  He relates his shortness of breath is improving.  Abdominal epigastric pain likely due to colitis: He relates he is having early satiety, continue IV Flagyl. Denies any abdominal pain diarrhea has improved no nausea or vomiting. His diet to full liquid diet.  Syncope: Discontinue cardiac monitoring no further workup Likely secondary to infectious etiology in the setting of pulmonary arterial hypertension.  Thrombocytopenia: Slowly improving continue to monitor.  Normocytic anemia: Hemoglobin at baseline no signs of bleeding.  Likely due to chemotherapy.  COPD GOLD IV criteria but never smoked  Continue inhalers seems to be stable.   Multiple myeloma (HCC) Continue acyclovir for prophylaxis, dexamethasone hold leflunomide.   DVT prophylaxis: lovenox Family Communication:none Disposition Plan/Barrier to D/C: home in 3-5 days Code Status:     Code Status Orders  (From admission, onward)        Start     Ordered   01/26/18 1832  Full code  Continuous     01/26/18 1831    Code Status History    Date Active Date Inactive Code Status Order ID  Comments User Context   11/06/2016 0103 11/09/2016 1739 Full Code [id number]  Rise Patience, MD Inpatient   01/11/2016 2227 01/23/2016 1654 Full Code [id number]  Reubin Milan, MD Inpatient   10/02/2015 1505 10/20/2015 2127 Full Code [id number]  Arnoldo Lenis Inpatient   09/26/2015 0425 09/30/2015 2012 Full Code [id number]  Danford, Suann Larry, MD Inpatient        IV Access:    Peripheral IV   Procedures and diagnostic studies:   Dg Chest 2 View  Result Date: 01/26/2018 CLINICAL DATA:  Acute onset shortness of breath today. Weakness, dizziness, and blurred vision. Undergoing chemotherapy for multiple myeloma. EXAM: CHEST - 2 VIEW COMPARISON:  CT on 01/12/2018 and chest radiograph on 12/11/2017. FINDINGS: Heart size is stable. Chronic left-sided pleural thickening again noted. No evidence of pneumothorax. New airspace opacity is seen in the inferior aspect of the right upper lobe, suspicious for pneumonia. Enlargement of pulmonary arteries is seen, consistent with pulmonary arterial hypertension. IMPRESSION: New right upper lobe airspace opacity, suspicious for pneumonia. Stable left pleural thickening. Stable pulmonary arterial enlargement, consistent pulmonary artery hypertension. Electronically Signed   By: Earle Gell M.D.   On: 01/26/2018 15:40   Ct Head Wo Contrast  Result Date: 01/26/2018 CLINICAL DATA:  Severe headache, blurred vision EXAM: CT HEAD WITHOUT CONTRAST TECHNIQUE: Contiguous axial images were obtained from the base of the skull through the vertex without intravenous contrast. COMPARISON:  None FINDINGS: Brain: No acute intracranial abnormality. Specifically, no hemorrhage, hydrocephalus, mass lesion, acute infarction, or significant intracranial injury. Vascular: No hyperdense  vessel or unexpected calcification. Skull: No acute calvarial abnormality. Sinuses/Orbits: Mucosal thickening in the right ethmoid air cells. Near complete opacification of the  right maxillary sinus. Mastoid air cells are clear. Other: None IMPRESSION: No acute intracranial abnormality. Electronically Signed   By: Rolm Baptise M.D.   On: 01/26/2018 15:21     Medical Consultants:    None.  Anti-Infectives:    cefepime and Flagyl  Subjective:    [name] [name] relates his breathing is close to baseline, he has had no further diarrhea but does feel bloated.  He relates his last bowel movement was a yesterday.  Objective:    Vitals:   01/27/18 2200 01/28/18 0555 01/28/18 0756 01/28/18 0945  BP: 99/66 104/70    Pulse: 71 65  70  Resp: 16 16    Temp: 97.9 F (36.6 C) 97.7 F (36.5 C)    TempSrc: Oral Oral    SpO2: 98% 99% 99% 100%  Weight:      Height:        Intake/Output Summary (Last 24 hours) at 01/28/2018 0959 Last data filed at 01/28/2018 0322 Gross per 24 hour  Intake 2370 ml  Output -  Net 2370 ml   Filed Weights   01/26/18 1817  Weight: 44 kg (97 lb)    Exam: General exam: In no acute distress. Respiratory system: Good air movement with crackles on the right side Cardiovascular system: S1 & S2 heard, RRR.  Gastrointestinal system: Abdomen is soft, with mild tenderness in the left lower quadrant no rebound or guarding.   Central nervous system: Alert and oriented. No focal neurological deficits. Extremities: No pedal edema. Skin: No rashes, lesions or ulcers Psychiatry: Judgement and insight appear normal. Mood & affect appropriate.    Data Reviewed:    Labs: Basic Metabolic Panel: Recent Labs  Lab 01/26/18 1445 01/27/18 0404  NA 138 136  K 4.2 4.3  CL 102 103  CO2 28 26  GLUCOSE 118* 154*  BUN 17 14  CREATININE 0.98 0.78  CALCIUM 7.7* 7.3*   GFR Estimated Creatinine Clearance: 64.9 mL/min (by C-G formula based on SCr of 0.78 mg/dL). Liver Function Tests: Recent Labs  Lab 01/26/18 1445  AST 42*  ALT 16*  ALKPHOS 61  BILITOT 0.7  PROT 7.0  ALBUMIN 2.5*   No results for input(s): LIPASE, AMYLASE in the last  168 hours. No results for input(s): AMMONIA in the last 168 hours. Coagulation profile Recent Labs  Lab 01/26/18 1735  INR 1.18    CBC: Recent Labs  Lab 01/26/18 1445 01/26/18 1735 01/27/18 0404  WBC 9.0  --  7.2  NEUTROABS 7.3  --   --   HGB 9.0*  --  10.3*  HCT 28.2*  --  32.1*  MCV 79.4  --  79.7  PLT 62* 51* 85*   Cardiac Enzymes: Recent Labs  Lab 01/26/18 1445  TROPONINI <0.03   BNP (last 3 results) No results for input(s): PROBNP in the last 8760 hours. CBG: No results for input(s): GLUCAP in the last 168 hours. D-Dimer: Recent Labs    01/26/18 1735  DDIMER 2.53*   Hgb A1c: No results for input(s): HGBA1C in the last 72 hours. Lipid Profile: No results for input(s): CHOL, HDL, LDLCALC, TRIG, CHOLHDL, LDLDIRECT in the last 72 hours. Thyroid function studies: No results for input(s): TSH, T4TOTAL, T3FREE, THYROIDAB in the last 72 hours.  Invalid input(s): FREET3 Anemia work up: No results for input(s): VITAMINB12, FOLATE, FERRITIN, TIBC, IRON,  RETICCTPCT in the last 72 hours. Sepsis Labs: Recent Labs  Lab 01/26/18 1445 01/26/18 1448 01/27/18 0404  WBC 9.0  --  7.2  LATICACIDVEN  --  2.35*  --    Microbiology Recent Results (from the past 240 hour(s))  Blood Culture (routine x 2)     Status: None (Preliminary result)   Collection Time: 01/26/18  2:50 PM  Result Value Ref Range Status   Specimen Description   Final    BLOOD Performed at [facility], [place] [address], [place], [place] [address]    Special Requests   Final    BOTTLES DRAWN AEROBIC AND ANAEROBIC Blood Culture results may not be optimal due to an excessive volume of blood received in culture bottles Performed at [facility] [address], [place], [place] [address]    Culture   Final    NO GROWTH < 24 HOURS Performed at [facility] [address], [place], [place] [address]    Report Status PENDING  Incomplete  Blood Culture  (routine x 2)     Status: None (Preliminary result)   Collection Time: 01/26/18  3:41 PM  Result Value Ref Range Status   Specimen Description   Final    BLOOD LEFT ANTECUBITAL Performed at [facility] [address], [place], [place] [address]    Special Requests   Final    BOTTLES DRAWN AEROBIC AND ANAEROBIC Blood Culture results may not be optimal due to an excessive volume of blood received in culture bottles Performed at [facility] [address], [place], [place] [address]    Culture   Final    NO GROWTH < 24 HOURS Performed at [facility] [address], [place], [place] [address]    Report Status PENDING  Incomplete  MRSA PCR Screening     Status: None   Collection Time: 01/27/18 10:44 AM  Result Value Ref Range Status   MRSA by PCR NEGATIVE NEGATIVE Final    Comment:        The GeneXpert MRSA Assay (FDA approved for NASAL specimens only), is one component of a comprehensive MRSA colonization surveillance program. It is not intended to diagnose MRSA infection nor to guide or monitor treatment for MRSA infections. Performed at [facility], [place] [address] [place], [place] [address]      Medications:   . acyclovir  400 mg Oral BID  . aspirin  325 mg Oral Daily  . dexamethasone  20 mg Oral Weekly  . enoxaparin (LOVENOX) injection  30 mg Subcutaneous Q24H  . psyllium  1 packet Oral Daily  . tadalafil (PAH)  40 mg Oral Daily  . umeclidinium-vilanterol  1 puff Inhalation Daily   Continuous Infusions: . sodium chloride 75 mL/hr at 01/27/18 1714  . ceFEPime (MAXIPIME) IV 2 g (01/28/18 0956)  . metronidazole Stopped (01/28/18 0422)    LOS: 2 days   [facility] Pager [phone]  *Please refer to [email or web address], password [email or web address] to get updated schedule on who will round on this patient, as hospitalists switch teams weekly. If 7PM-7AM, please contact night-coverage at  [email or web address], password [email or web address] for any overnight needs.  01/28/2018, 9:59 AM

## 2018-01-28 NOTE — Plan of Care (Signed)
  Problem: Nutrition: Goal: Adequate nutrition will be maintained Outcome: Progressing   Problem: Elimination: Goal: Will not experience complications related to bowel motility Outcome: Progressing   Problem: Safety: Goal: Ability to remain free from injury will improve Outcome: Progressing   

## 2018-01-28 NOTE — Progress Notes (Signed)
IP PROGRESS NOTE  Subjective:  Patient remains afebrile at this time, blood pressures are improving and heart rate has stabilized.  Patient continues to complain of mild abdominal discomfort with mild diarrhea.  Still short of breath.  Objective: Vital signs in last 24 hours: Blood pressure 104/70, pulse 65, temperature 97.7 F (36.5 C), temperature source Oral, resp. rate 16, height 5' (1.524 m), weight 97 lb (44 kg), SpO2 99 %.  Intake/Output from previous day: 03/26 0701 - 03/27 0700 In: 2730 [P.O.:720; I.V.:1510; IV Piggyback:500] Out: -   Physical Exam: Patient is awake, alert, oriented x3, but appears fatigued.  No obvious pain or respiratory distress. HEENT: Anicteric sclera, moist mucous membranes, no oral sores. Lungs: Decreased breath sounds in the dependent portions of both lungs.  Diffuse expiratory wheezing bilaterally. Cardiac: S1, S2, regular no murmurs Abdomen: Abdomen is soft, moderately tender diffusely without rebound. Extremities: No lower extremity swelling.     Lab Results: Recent Labs    01/26/18 1445 01/26/18 1735 01/27/18 0404  WBC 9.0  --  7.2  HGB 9.0*  --  10.3*  HCT 28.2*  --  32.1*  PLT 62* 51* 85*    BMET Recent Labs    01/26/18 1445 01/27/18 0404  NA 138 136  K 4.2 4.3  CL 102 103  CO2 28 26  GLUCOSE 118* 154*  BUN 17 14  CREATININE 0.98 0.78  CALCIUM 7.7* 7.3*    No results found for: CEA1  Studies/Results: Dg Chest 2 View  Result Date: 01/26/2018 CLINICAL DATA:  Acute onset shortness of breath today. Weakness, dizziness, and blurred vision. Undergoing chemotherapy for multiple myeloma. EXAM: CHEST - 2 VIEW COMPARISON:  CT on 01/12/2018 and chest radiograph on 12/11/2017. FINDINGS: Heart size is stable. Chronic left-sided pleural thickening again noted. No evidence of pneumothorax. New airspace opacity is seen in the inferior aspect of the right upper lobe, suspicious for pneumonia. Enlargement of pulmonary arteries is seen,  consistent with pulmonary arterial hypertension. IMPRESSION: New right upper lobe airspace opacity, suspicious for pneumonia. Stable left pleural thickening. Stable pulmonary arterial enlargement, consistent pulmonary artery hypertension. Electronically Signed   By: Earle Gell M.D.   On: 01/26/2018 15:40   Ct Head Wo Contrast  Result Date: 01/26/2018 CLINICAL DATA:  Severe headache, blurred vision EXAM: CT HEAD WITHOUT CONTRAST TECHNIQUE: Contiguous axial images were obtained from the base of the skull through the vertex without intravenous contrast. COMPARISON:  None FINDINGS: Brain: No acute intracranial abnormality. Specifically, no hemorrhage, hydrocephalus, mass lesion, acute infarction, or significant intracranial injury. Vascular: No hyperdense vessel or unexpected calcification. Skull: No acute calvarial abnormality. Sinuses/Orbits: Mucosal thickening in the right ethmoid air cells. Near complete opacification of the right maxillary sinus. Mastoid air cells are clear. Other: None IMPRESSION: No acute intracranial abnormality. Electronically Signed   By: Rolm Baptise M.D.   On: 01/26/2018 15:21    Medications: I have reviewed the patient's current medications.  Assessment/Plan: 57 y.o. with recent diagnosis of active multiple myeloma treated with bortezomib and low-dose dexamethasone induction.  Patient has extensive history of recurrent empyema, previously treated with pleurodesis.  Admitted to the hospital due to syncopal event in the context of progressive shortness of breath and abdominal pain.  CT scan of the abdomen obtained on Friday was consistent with colitis.  Patient was started on metronidazole on 01/25/18.  Additional evaluation with echocardiogram yesterday appears to indicate presence of significant pulmonary hypertension based on dilated right ventricle and indirect pulmonary artery pressure.  Recommendation: -Continue antibiotics at the present time, consider narrowing the  spectrum of antibiotics if patient remains afebrile and abdominal pain continues to improve tomorrow. -Consider consulting cardiology for pulmonary hypertension management - Systemic treatment is on hold at the present time.  Patient may benefit from status post steroids considering dexamethasone dosing during the treatment course. -Duration of hospitalization will be at discretion of the primary treating service      LOS: 2 days   Ardath Sax, MD   01/28/2018, 9:21 AM

## 2018-01-29 ENCOUNTER — Other Ambulatory Visit: Payer: Self-pay

## 2018-01-29 ENCOUNTER — Telehealth: Payer: Self-pay | Admitting: Hematology and Oncology

## 2018-01-29 ENCOUNTER — Ambulatory Visit: Payer: Self-pay

## 2018-01-29 DIAGNOSIS — C9000 Multiple myeloma not having achieved remission: Secondary | ICD-10-CM

## 2018-01-29 DIAGNOSIS — A419 Sepsis, unspecified organism: Secondary | ICD-10-CM

## 2018-01-29 DIAGNOSIS — J9621 Acute and chronic respiratory failure with hypoxia: Secondary | ICD-10-CM

## 2018-01-29 DIAGNOSIS — J9611 Chronic respiratory failure with hypoxia: Secondary | ICD-10-CM

## 2018-01-29 DIAGNOSIS — J189 Pneumonia, unspecified organism: Principal | ICD-10-CM

## 2018-01-29 DIAGNOSIS — R6520 Severe sepsis without septic shock: Secondary | ICD-10-CM

## 2018-01-29 DIAGNOSIS — J181 Lobar pneumonia, unspecified organism: Secondary | ICD-10-CM

## 2018-01-29 MED ORDER — METRONIDAZOLE 500 MG PO TABS: 500.0000 mg | ORAL_TABLET | Freq: Three times a day (TID) | ORAL | 0 refills | Status: DC

## 2018-01-29 MED ORDER — METRONIDAZOLE 500 MG PO TABS
500.0000 mg | ORAL_TABLET | Freq: Three times a day (TID) | ORAL | Status: DC
  Administered 2018-01-29: 500 mg via ORAL
  Filled 2018-01-29: qty 1

## 2018-01-29 MED ORDER — LEVOFLOXACIN 500 MG PO TABS
500.0000 mg | ORAL_TABLET | Freq: Every day | ORAL | Status: DC
  Administered 2018-01-29: 500 mg via ORAL
  Filled 2018-01-29: qty 1

## 2018-01-29 MED ORDER — ALBUTEROL SULFATE (2.5 MG/3ML) 0.083% IN NEBU: 2.5000 mg | INHALATION_SOLUTION | RESPIRATORY_TRACT | Status: DC | PRN

## 2018-01-29 MED ORDER — ALBUTEROL SULFATE (2.5 MG/3ML) 0.083% IN NEBU
2.5000 mg | INHALATION_SOLUTION | Freq: Three times a day (TID) | RESPIRATORY_TRACT | Status: DC
  Administered 2018-01-29: 2.5 mg via RESPIRATORY_TRACT
  Filled 2018-01-29 (×2): qty 3

## 2018-01-29 MED ORDER — LEVOFLOXACIN 500 MG PO TABS: 500.0000 mg | ORAL_TABLET | Freq: Every day | ORAL | 0 refills | Status: DC

## 2018-01-29 MED FILL — metroNIDAZOLE 500 MG TABS: 500 | 10 days supply | Qty: 30 | Fill #0

## 2018-01-29 MED FILL — levoFLOXacin 500 MG TABS: 500 | 5 days supply | Qty: 5 | Fill #0

## 2018-01-29 NOTE — Progress Notes (Signed)
01/29/18  1139  Reviewed discharge instructions with patient. Patient verbalized understanding of discharge instructions. Copy of discharge instructions and prescriptions given to patient.

## 2018-01-29 NOTE — Plan of Care (Signed)
  Problem: Elimination: Goal: Will not experience complications related to bowel motility Outcome: Progressing   Problem: Pain Managment: Goal: General experience of comfort will improve Outcome: Progressing   Problem: Nutrition: Goal: Adequate nutrition will be maintained Outcome: Progressing   

## 2018-01-29 NOTE — Progress Notes (Signed)
IP PROGRESS NOTE  Subjective:  Patient remains afebrile at this time, blood pressures are improving and heart rate has stabilized.  Patient continues to complain of mild abdominal discomfort with mild diarrhea.  Still short of breath.  Objective: Vital signs in last 24 hours: Blood pressure 104/69, pulse 70, temperature 98.2 F (36.8 C), temperature source Oral, resp. rate 12, height 5' (1.524 m), weight 97 lb (44 kg), SpO2 96 %.  Intake/Output from previous day: 03/27 0701 - 03/28 0700 In: 1871.3 [P.O.:240; I.V.:1231.3; IV Piggyback:400] Out: 400 [Urine:400]  Physical Exam: Patient is awake, alert, oriented x3, but appears fatigued.  No obvious pain or respiratory distress. HEENT: Anicteric sclera, moist mucous membranes, no oral sores. Lungs: Decreased breath sounds in the dependent portions of both lungs.  Diffuse expiratory wheezing bilaterally. Cardiac: S1, S2, regular no murmurs Abdomen: Abdomen is soft, moderately tender diffusely without rebound. Extremities: No lower extremity swelling.     Lab Results: Recent Labs    01/26/18 1445 01/26/18 1735 01/27/18 0404  WBC 9.0  --  7.2  HGB 9.0*  --  10.3*  HCT 28.2*  --  32.1*  PLT 62* 51* 85*    BMET Recent Labs    01/26/18 1445 01/27/18 0404  NA 138 136  K 4.2 4.3  CL 102 103  CO2 28 26  GLUCOSE 118* 154*  BUN 17 14  CREATININE 0.98 0.78  CALCIUM 7.7* 7.3*    No results found for: CEA1  Studies/Results: No results found.  Medications: I have reviewed the patient's current medications.  Assessment/Plan: 57 y.o. with recent diagnosis of active multiple myeloma treated with bortezomib and low-dose dexamethasone induction.  Patient has extensive history of recurrent empyema, previously treated with pleurodesis.  Admitted to the hospital due to syncopal event in the context of progressive shortness of breath and abdominal pain.  CT scan of the abdomen obtained on Friday was consistent with colitis.  Patient  was started on metronidazole on 01/25/18.  Additional evaluation with echocardiogram yesterday appears to indicate presence of significant pulmonary hypertension based on dilated right ventricle and indirect pulmonary artery pressure.  Recommendation: -Agree with the plan to discharge patient home today - Will hold systemic therapy for another week. -Patient will return to my clinic on 02/09/18 for labs, clinical evaluation, and possible reinitiation of systemic antineoplastic therapy.      LOS: 3 days   Ardath Sax, MD   01/29/2018, 2:26 PM

## 2018-01-29 NOTE — Telephone Encounter (Signed)
Oral Oncology Patient Advocate Encounter  Called to talk to patient about his application for his Revlimid. He will be calling us back. Need to set up time for him to come by and sign application for [employer].   [facility] Patient Advocate [phone] 01/29/2018 3:44 PM

## 2018-01-29 NOTE — Discharge Summary (Signed)
Physician Discharge Summary  [name] [name] RCV:[id number] DOB: Dec 12, 1960 DOA: 01/26/2018  PCP: Patient, No Pcp Per  Admit date: 01/26/2018 Discharge date: 01/29/2018  Admitted From: home Disposition:  Home  Recommendations for Outpatient Follow-up:  1. Follow up with PCP in 1-2 weeks 2. Please obtain BMP/CBC in one week   Home Health:home Equipment/Devices:Oxygen  Discharge Condition:stable CODE STATUS:full Diet recommendation: Heart Healthy   Brief/Interim Summary: 57 y.o. male past medical history of pulmonary hypertension with a PA pressure of 59 with moderate RV dilation on echo on 07/01/2017, multiply myeloma status post 3 cycles, COPD Gold stage III and PFTs of 02/16/2016, also with a history of left pleural empyema requiring VATS in 2016 comes in with increased dyspnea on exertion and a syncopal episode.    Discharge Diagnoses:  Active Problems:   Pulmonary HTN (HCC)   COPD GOLD IV criteria but never smoked    Chronic respiratory failure with hypoxia ([place])   Community acquired pneumonia   Multiple myeloma ([place])   Acute on chronic respiratory failure with hypoxia (HCC)  Acute on chronic respiratory failure due to community-acquired pneumonia: He was started empirically on IV vancomycin and cefepime, influenza PCR was negative culture data remain negative. Once his breathing was improved he was changed to oral Levaquin which she will continue as an outpatient for 5 additional days.  Abdominal epigastric pain likely due to colitis: He was continue on his Flagyl IV here in the hospital he was tolerating his diet his abdominal pain improved diarrhea resolved. He will continue Flagyl as an outpatient.  Syncope: In the setting of pulmonary hypertension with superimposed infection, no events on telemetry.  Thrombocytopenia: Slowly improving.  Anemia due to chemotherapy Hemoglobin remained stable likely due to chemotherapy  COPD gold 4 criteria: Continue  inhalers.  Multiple myeloma: Continues acyclovir for prophylaxis, dexamethasone and he will continue chemotherapy as an outpatient.  Discharge Instructions  Discharge Instructions    Diet - low sodium heart healthy   Complete by:  As directed    For home use only DME oxygen   Complete by:  As directed    Mode or (Route):  Nasal cannula   Liters per Minute:  2   Oxygen delivery system:  Gas   Increase activity slowly   Complete by:  As directed      Allergies as of 01/29/2018   No Known Allergies     Medication List    STOP taking these medications   acyclovir 400 MG tablet Commonly known as:  ZOVIRAX     TAKE these medications   albuterol 108 (90 Base) MCG/ACT inhaler Commonly known as:  PROVENTIL HFA;VENTOLIN HFA Inhale 2 puffs into the lungs every 6 (six) hours as needed for wheezing or shortness of breath.   aspirin 325 MG EC tablet Take 1 tablet (325 mg total) by mouth daily.   dexamethasone 4 MG tablet Commonly known as:  DECADRON Take 5 tablets (20 mg total) by mouth once a week.   lenalidomide 25 MG capsule Commonly known as:  REVLIMID Take 1 capsule (25 mg total) by mouth daily for 14 days.   levofloxacin 500 MG tablet Commonly known as:  LEVAQUIN Take 1 tablet (500 mg total) by mouth daily.   metroNIDAZOLE 500 MG tablet Commonly known as:  FLAGYL Take 1 tablet (500 mg total) by mouth 3 (three) times daily for 10 days.   ondansetron 8 MG tablet Commonly known as:  ZOFRAN Take 1 tablet (8 mg total) by mouth 2 (  two) times daily as needed (Nausea or vomiting).   Oxycodone HCl 10 MG Tabs Take 0.5 tablets (5 mg total) by mouth every 4 (four) hours as needed for up to 21 days for severe pain.   OXYGEN 2lpm BEDTIME ONLY   tadalafil (PAH) 20 MG tablet Commonly known as:  ADCIRCA Take 2 tablets (40 mg total) by mouth daily.   umeclidinium-vilanterol 62.5-25 MCG/INH Aepb Commonly known as:  ANORO ELLIPTA Inhale 1 puff into the lungs daily.             Durable Medical Equipment  (From admission, onward)        Start     Ordered   01/29/18 0000  For home use only DME oxygen    Question Answer Comment  Mode or (Route) Nasal cannula   Liters per Minute 2   Oxygen delivery system Gas      01/29/18 0804      No Known Allergies  Consultations:  Oncology   Procedures/Studies: Dg Chest 2 View  Result Date: 01/26/2018 CLINICAL DATA:  Acute onset shortness of breath today. Weakness, dizziness, and blurred vision. Undergoing chemotherapy for multiple myeloma. EXAM: CHEST - 2 VIEW COMPARISON:  CT on 01/12/2018 and chest radiograph on 12/11/2017. FINDINGS: Heart size is stable. Chronic left-sided pleural thickening again noted. No evidence of pneumothorax. New airspace opacity is seen in the inferior aspect of the right upper lobe, suspicious for pneumonia. Enlargement of pulmonary arteries is seen, consistent with pulmonary arterial hypertension. IMPRESSION: New right upper lobe airspace opacity, suspicious for pneumonia. Stable left pleural thickening. Stable pulmonary arterial enlargement, consistent pulmonary artery hypertension. Electronically Signed   By: Earle Gell M.D.   On: 01/26/2018 15:40   Ct Head Wo Contrast  Result Date: 01/26/2018 CLINICAL DATA:  Severe headache, blurred vision EXAM: CT HEAD WITHOUT CONTRAST TECHNIQUE: Contiguous axial images were obtained from the base of the skull through the vertex without intravenous contrast. COMPARISON:  None FINDINGS: Brain: No acute intracranial abnormality. Specifically, no hemorrhage, hydrocephalus, mass lesion, acute infarction, or significant intracranial injury. Vascular: No hyperdense vessel or unexpected calcification. Skull: No acute calvarial abnormality. Sinuses/Orbits: Mucosal thickening in the right ethmoid air cells. Near complete opacification of the right maxillary sinus. Mastoid air cells are clear. Other: None IMPRESSION: No acute intracranial abnormality.  Electronically Signed   By: Rolm Baptise M.D.   On: 01/26/2018 15:21   Ct Angio Chest Pe W Or Wo Contrast  Result Date: 01/12/2018 CLINICAL DATA:  Chest tightness and shortness of breath for 2 weeks. Evaluate for pulmonary embolism. History of multiple myeloma. EXAM: CT ANGIOGRAPHY CHEST WITH CONTRAST TECHNIQUE: Multidetector CT imaging of the chest was performed using the standard protocol during bolus administration of intravenous contrast. Multiplanar CT image reconstructions and MIPs were obtained to evaluate the vascular anatomy. CONTRAST:  34m ISOVUE-370 IOPAMIDOL (ISOVUE-370) INJECTION 76% COMPARISON:  Chest CT dated 11/06/2016 FINDINGS: Cardiovascular: Some of the peripheral lower lobe segmental and subsegmental pulmonary arteries cannot be definitively characterized due to patient breathing motion artifact and obscuring left lower lobe consolidation/pleural collection. There is no pulmonary embolism identified within the main, lobar or central segmental pulmonary arteries bilaterally. Stable chronic enlargement of the pulmonary arteries. Thoracic aorta is normal in caliber and configuration with no evidence of dissection. No pericardial effusion. Mediastinum/Nodes: No mass or enlarged lymph nodes seen within the mediastinum. Esophagus is unremarkable. Trachea and central bronchi are unremarkable. Lungs/Pleura: Chronic pleural collection on the left, again containing fluid and air, stable. Chronic pleural  thickening and atelectasis at the right lung base, with associated pleural calcifications. No new lung findings to suggest a developing pneumonia. No pneumothorax. Upper Abdomen: Limited images of the upper abdomen are unremarkable. Musculoskeletal: No acute appearing osseous abnormality. Superficial soft tissues are unremarkable. Review of the MIP images confirms the above findings. IMPRESSION: 1. No acute findings. No pulmonary embolism seen, with mild study limitations detailed above. No evidence of  developing pneumonia or pulmonary edema. 2. Chronic left hemithorax pleural collection which contains fluid and air, stable. Additional chronic pleural thickening and atelectasis at the right lung base. 3. Chronic pulmonary artery enlargement indicating pulmonary artery hypertension. Electronically Signed   By: Franki Cabot M.D.   On: 01/12/2018 12:48   Ct Abdomen Pelvis W Contrast  Result Date: 01/24/2018 CLINICAL DATA:  Left-sided abdominal pain for 1 year. EXAM: CT ABDOMEN AND PELVIS WITH CONTRAST TECHNIQUE: Multidetector CT imaging of the abdomen and pelvis was performed using the standard protocol following bolus administration of intravenous contrast. CONTRAST:  113m ISOVUE-300 IOPAMIDOL (ISOVUE-300) INJECTION 61% COMPARISON:  CT scan 12/23/2017 FINDINGS: Lower chest: Chronic changes noted at the left lung base with a chronic complex pleural fluid collection with thick rim of slightly enhancing soft tissue density. Stable more nodular pleural density with overlying atelectasis and calcifications. There are densely calcified pleural plaques on the right side also. Subpleural atelectasis and scarring changes noted along with bronchiectasis and enlarged pulmonary arteries. Hepatobiliary: 2 small foci of enhancement, one in the left hepatic lobe at the dome and the other in segment 6 inferiorly and posteriorly. These are likely vascular shunts or flash filling hemangiomas. No worrisome hepatic lesions or intrahepatic biliary dilatation. The gallbladder is mildly contracted. No significant common bile duct dilatation. Pancreas: No mass, inflammation or ductal dilatation. Spleen: Normal size. Peripheral calcifications possibly due to prior trauma. Adrenals/Urinary Tract: The adrenal glands and kidneys are unremarkable and stable. Small cysts are noted. No worrisome renal lesions or hydronephrosis. Stomach/Bowel: The stomach, duodenum and small bowel are grossly normal. No acute inflammatory changes, mass  lesions or obstructive findings. The third portion of the duodenum does cross the midline but immediately swings back into the right upper quadrant and most of the small bowel is in the right abdomen. The cecum is in the right lower quadrant. There are swirling mesenteric vessels and findings suggest a loose mesenteric attachment. No obstruction or midgut volvulus. Moderate inflammatory changes involving the colon beginning at the hepatic flexure and involving the transverse colon, descending colon and sigmoid colon and rectum. This is likely an inflammatory or infectious colitis. The terminal ileum is normal. The appendix is normal. Vascular/Lymphatic: Small scattered mesenteric and retroperitoneal lymph nodes but no mass or overt adenopathy. Reproductive: The prostate gland and seminal vesicles are unremarkable. Other: Mesenteric edema and a small amount of free abdominal and pelvic fluid likely related to the colonic inflammatory process. Musculoskeletal: Stable appearing lytic lesion involving the L4 vertebral body. No new bone lesions. IMPRESSION: 1. Chronic changes at the lung bases as described above. 2. Colonic inflammatory process, likely infectious or inflammatory colitis. No mass or obstruction. 3. Small enhancing hepatic lesions are likely vascular shunts or possible flash filling hemangiomas. Recommend attention on future studies. Electronically Signed   By: PMarijo SanesM.D.   On: 01/24/2018 11:52   Ct Biopsy  Result Date: 01/02/2018 CLINICAL DATA:  Multiple myeloma and status post chemotherapy. Bone marrow biopsy needed for reassessment bone marrow involvement. EXAM: CT GUIDED BONE MARROW ASPIRATION AND BIOPSY ANESTHESIA/SEDATION: Versed  2.0 mg IV, Fentanyl 100 mcg IV Total Moderate Sedation Time:   12 minutes. The patient's level of consciousness and physiologic status were continuously monitored during the procedure by Radiology nursing. PROCEDURE: The procedure risks, benefits, and alternatives  were explained to the patient. Questions regarding the procedure were encouraged and answered. The patient understands and consents to the procedure. A time out was performed prior to initiating the procedure. The right gluteal region was prepped with chlorhexidine. Sterile gown and sterile gloves were used for the procedure. Local anesthesia was provided with 1% Lidocaine. Under CT guidance, an 11 gauge On Control bone cutting needle was advanced from a posterior approach into the right iliac bone. Needle positioning was confirmed with CT. Initial non heparinized and heparinized aspirate samples were obtained of bone marrow. Core biopsy was performed via the On Control drill needle. COMPLICATIONS: None FINDINGS: Inspection of initial aspirate did reveal visible particles. Intact core biopsy sample was obtained. IMPRESSION: CT guided bone marrow biopsy of right posterior iliac bone with both aspirate and core samples obtained. Electronically Signed   By: Aletta Edouard M.D.   On: 01/02/2018 10:48   Ct Bone Marrow Biopsy & Aspiration  Result Date: 01/02/2018 CLINICAL DATA:  Multiple myeloma and status post chemotherapy. Bone marrow biopsy needed for reassessment bone marrow involvement. EXAM: CT GUIDED BONE MARROW ASPIRATION AND BIOPSY ANESTHESIA/SEDATION: Versed 2.0 mg IV, Fentanyl 100 mcg IV Total Moderate Sedation Time:   12 minutes. The patient's level of consciousness and physiologic status were continuously monitored during the procedure by Radiology nursing. PROCEDURE: The procedure risks, benefits, and alternatives were explained to the patient. Questions regarding the procedure were encouraged and answered. The patient understands and consents to the procedure. A time out was performed prior to initiating the procedure. The right gluteal region was prepped with chlorhexidine. Sterile gown and sterile gloves were used for the procedure. Local anesthesia was provided with 1% Lidocaine. Under CT guidance,  an 11 gauge On Control bone cutting needle was advanced from a posterior approach into the right iliac bone. Needle positioning was confirmed with CT. Initial non heparinized and heparinized aspirate samples were obtained of bone marrow. Core biopsy was performed via the On Control drill needle. COMPLICATIONS: None FINDINGS: Inspection of initial aspirate did reveal visible particles. Intact core biopsy sample was obtained. IMPRESSION: CT guided bone marrow biopsy of right posterior iliac bone with both aspirate and core samples obtained. Electronically Signed   By: Aletta Edouard M.D.   On: 01/02/2018 10:48       Subjective: He relates his breathing is better has not had any further diarrhea.  Discharge Exam: Vitals:   01/29/18 0447 01/29/18 0749  BP:    Pulse:    Resp:    Temp:    SpO2: 96% 96%   Vitals:   01/28/18 1954 01/29/18 0422 01/29/18 0447 01/29/18 0749  BP: 92/64 104/69    Pulse: 66 70    Resp: 14 12    Temp: 98.1 F (36.7 C) 98.2 F (36.8 C)    TempSrc: Oral Oral    SpO2: 98% 92% 96% 96%  Weight:      Height:        General: Pt is alert, awake, not in acute distress Cardiovascular: RRR, S1/S2 +, no rubs, no gallops Respiratory: CTA bilaterally, no wheezing, no rhonchi Abdominal: Soft, NT, ND, bowel sounds + Extremities: no edema, no cyanosis    The results of significant diagnostics from this hospitalization (including imaging, microbiology, ancillary  and laboratory) are listed below for reference.     Microbiology: Recent Results (from the past 240 hour(s))  Blood Culture (routine x 2)     Status: None (Preliminary result)   Collection Time: 01/26/18  2:50 PM  Result Value Ref Range Status   Specimen Description   Final    BLOOD Performed at [facility], [place] [address] [place], [place] [address]    Special Requests   Final    BOTTLES DRAWN AEROBIC AND ANAEROBIC Blood Culture results may not be optimal due to an excessive volume  of blood received in culture bottles Performed at [facility] [address] [place], [place] [address]    Culture   Final    NO GROWTH 2 DAYS Performed at [facility] [address] [place], [place] [address]    Report Status PENDING  Incomplete  Blood Culture (routine x 2)     Status: None (Preliminary result)   Collection Time: 01/26/18  3:41 PM  Result Value Ref Range Status   Specimen Description   Final    BLOOD LEFT ANTECUBITAL Performed at [facility] [address], [place], [place] [address]    Special Requests   Final    BOTTLES DRAWN AEROBIC AND ANAEROBIC Blood Culture results may not be optimal due to an excessive volume of blood received in culture bottles Performed at [facility] [address] [place], [place] [address]    Culture   Final    NO GROWTH 2 DAYS Performed at [facility] [address], [place], [place] [address]    Report Status PENDING  Incomplete  MRSA PCR Screening     Status: None   Collection Time: 01/27/18 10:44 AM  Result Value Ref Range Status   MRSA by PCR NEGATIVE NEGATIVE Final    Comment:        The GeneXpert MRSA Assay (FDA approved for NASAL specimens only), is one component of a comprehensive MRSA colonization surveillance program. It is not intended to diagnose MRSA infection nor to guide or monitor treatment for MRSA infections. Performed at [facility], [place] [address], [place],  [address]      Labs: BNP (last 3 results) Recent Labs    01/26/18 1445 01/26/18 1900  BNP 465.9* 211.9*   Basic Metabolic Panel: Recent Labs  Lab 01/26/18 1445 01/27/18 0404  NA 138 136  K 4.2 4.3  CL 102 103  CO2 28 26  GLUCOSE 118* 154*  BUN 17 14  CREATININE 0.98 0.78  CALCIUM 7.7* 7.3*   Liver Function Tests: Recent Labs  Lab 01/26/18 1445  AST 42*  ALT 16*  ALKPHOS 61  BILITOT 0.7  PROT 7.0  ALBUMIN 2.5*   No  results for input(s): LIPASE, AMYLASE in the last 168 hours. No results for input(s): AMMONIA in the last 168 hours. CBC: Recent Labs  Lab 01/26/18 1445 01/26/18 1735 01/27/18 0404  WBC 9.0  --  7.2  NEUTROABS 7.3  --   --   HGB 9.0*  --  10.3*  HCT 28.2*  --  32.1*  MCV 79.4  --  79.7  PLT 62* 51* 85*   Cardiac Enzymes: Recent Labs  Lab 01/26/18 1445  TROPONINI <0.03   BNP: Invalid input(s): POCBNP CBG: No results for input(s): GLUCAP in the last 168 hours. D-Dimer Recent Labs    01/26/18 1735  DDIMER 2.53*   Hgb  A1c No results for input(s): HGBA1C in the last 72 hours. Lipid Profile No results for input(s): CHOL, HDL, LDLCALC, TRIG, CHOLHDL, LDLDIRECT in the last 72 hours. Thyroid function studies No results for input(s): TSH, T4TOTAL, T3FREE, THYROIDAB in the last 72 hours.  Invalid input(s): FREET3 Anemia work up No results for input(s): VITAMINB12, FOLATE, FERRITIN, TIBC, IRON, RETICCTPCT in the last 72 hours. Urinalysis    Component Value Date/Time   COLORURINE YELLOW 12/22/2017 1323   APPEARANCEUR CLEAR 12/22/2017 1323   LABSPEC 1.011 12/22/2017 1323   PHURINE 8.0 12/22/2017 1323   GLUCOSEU NEGATIVE 12/22/2017 1323   HGBUR NEGATIVE 12/22/2017 1323   BILIRUBINUR NEGATIVE 12/22/2017 1323   KETONESUR NEGATIVE 12/22/2017 1323   PROTEINUR NEGATIVE 12/22/2017 1323   UROBILINOGEN 0.2 11/11/2012 0145   NITRITE NEGATIVE 12/22/2017 1323   LEUKOCYTESUR NEGATIVE 12/22/2017 1323   Sepsis Labs Invalid input(s): PROCALCITONIN,  WBC,  LACTICIDVEN Microbiology Recent Results (from the past 240 hour(s))  Blood Culture (routine x 2)     Status: None (Preliminary result)   Collection Time: 01/26/18  2:50 PM  Result Value Ref Range Status   Specimen Description   Final    BLOOD Performed at [facility], [place] [address] [place], [place] [address]    Special Requests   Final    BOTTLES DRAWN AEROBIC AND ANAEROBIC Blood Culture results may not  be optimal due to an excessive volume of blood received in culture bottles Performed at [facility] [address] [place], [place] [address]    Culture   Final    NO GROWTH 2 DAYS Performed at [facility] [address], [place], [place] [address]    Report Status PENDING  Incomplete  Blood Culture (routine x 2)     Status: None (Preliminary result)   Collection Time: 01/26/18  3:41 PM  Result Value Ref Range Status   Specimen Description   Final    BLOOD LEFT ANTECUBITAL Performed at [facility] [address] [place], [place] [address]    Special Requests   Final    BOTTLES DRAWN AEROBIC AND ANAEROBIC Blood Culture results may not be optimal due to an excessive volume of blood received in culture bottles Performed at [facility] [address], [place], [place] [address]    Culture   Final    NO GROWTH 2 DAYS Performed at [facility] [address], [place], [place] [address]    Report Status PENDING  Incomplete  MRSA PCR Screening     Status: None   Collection Time: 01/27/18 10:44 AM  Result Value Ref Range Status   MRSA by PCR NEGATIVE NEGATIVE Final    Comment:        The GeneXpert MRSA Assay (FDA approved for NASAL specimens only), is one component of a comprehensive MRSA colonization surveillance program. It is not intended to diagnose MRSA infection nor to guide or monitor treatment for MRSA infections. Performed at [facility], [place] [address] [place],  [address]      Time coordinating discharge: Over 30 minutes  SIGNED:   Charlynne Cousins, MD  [facility] 01/29/2018, 8:04 AM Pager   If 7PM-7AM, please contact night-coverage [email or web address] Password [email or web address]

## 2018-01-29 NOTE — Telephone Encounter (Signed)
Spoke with patient regarding appointment per 3/28 sch msg

## 2018-01-31 LAB — CULTURE, BLOOD (ROUTINE X 2)
Culture: NO GROWTH
Culture: NO GROWTH

## 2018-02-02 ENCOUNTER — Ambulatory Visit: Payer: Self-pay | Admitting: Hematology and Oncology

## 2018-02-02 ENCOUNTER — Other Ambulatory Visit: Payer: Self-pay

## 2018-02-02 ENCOUNTER — Ambulatory Visit: Payer: Self-pay | Admitting: Medical

## 2018-02-02 ENCOUNTER — Inpatient Hospital Stay: Payer: Self-pay | Attending: Hematology

## 2018-02-02 ENCOUNTER — Ambulatory Visit: Payer: Self-pay

## 2018-02-02 ENCOUNTER — Other Ambulatory Visit: Payer: Self-pay | Admitting: Medical

## 2018-02-02 DIAGNOSIS — R5383 Other fatigue: Secondary | ICD-10-CM

## 2018-02-02 DIAGNOSIS — R601 Generalized edema: Secondary | ICD-10-CM

## 2018-02-02 DIAGNOSIS — I272 Pulmonary hypertension, unspecified: Secondary | ICD-10-CM | POA: Insufficient documentation

## 2018-02-02 DIAGNOSIS — C9000 Multiple myeloma not having achieved remission: Secondary | ICD-10-CM | POA: Insufficient documentation

## 2018-02-02 DIAGNOSIS — B029 Zoster without complications: Secondary | ICD-10-CM | POA: Insufficient documentation

## 2018-02-02 DIAGNOSIS — R0602 Shortness of breath: Secondary | ICD-10-CM

## 2018-02-02 DIAGNOSIS — J449 Chronic obstructive pulmonary disease, unspecified: Secondary | ICD-10-CM | POA: Insufficient documentation

## 2018-02-02 DIAGNOSIS — J869 Pyothorax without fistula: Secondary | ICD-10-CM | POA: Insufficient documentation

## 2018-02-03 ENCOUNTER — Telehealth: Payer: Self-pay | Admitting: Pharmacy Technician

## 2018-02-03 ENCOUNTER — Telehealth: Payer: Self-pay | Admitting: Pharmacist

## 2018-02-03 DIAGNOSIS — C9000 Multiple myeloma not having achieved remission: Secondary | ICD-10-CM

## 2018-02-03 MED ORDER — LENALIDOMIDE 25 MG PO CAPS: 25.0000 mg | ORAL_CAPSULE | Freq: Every day | ORAL | 0 refills | Status: DC

## 2018-02-03 NOTE — Telephone Encounter (Signed)
Oral Oncology Patient Advocate Encounter  Spoke with patient's representative, Anderson Malta, on the phone to discuss obtaining Revlimid.  The patient is uninsured for prescription drugs.    I was able to obtain all necessary information to complete application for [place] in an effort to obtain Revlimid for $0 out-of-pocket.     Application completed online.  I advised Anderson Malta that she should be expecting a call from the [place] representative to complete the application process.   This encounter will be updated until final determination.   Fabio Asa. Melynda Keller, [facility] [phone] 02/03/2018 10:07 AM

## 2018-02-03 NOTE — Telephone Encounter (Signed)
Oral Oncology Patient Advocate Encounter  Received notification from [place] Patient [employer] patient assistance program that Mr. [employer] has been successfully enrolled into their program to receive Revlimid from the manufacturer at $0 out of pocket until 11/03/2018.  Oral Oncology Clinic will continue to follow.  Gilmore Laroche, CPhT, [facility]cology Patient Advocate [phone] 02/03/2018 1:45 PM

## 2018-02-03 NOTE — Telephone Encounter (Signed)
Oral Oncology Pharmacist Encounter  Received notification that patient has been successfully enrolled in [place] manufacturer assistance program to receive Revlimid at $0 out-of-pocket cost to patient.  Revlimid prescription E scribed to [facility] as this is been dispensing pharmacy for this patient assistance program.  I will plan to see the patient while in the infusion room next Monday (02/09/2018) for follow-up of his Revlimid start.  Oral Oncology Clinic will continue to follow.  Johny Drilling, PharmD, BCPS, BCOP 02/03/2018 1:52 PM Oral Oncology Clinic [phone]

## 2018-02-04 NOTE — Progress Notes (Signed)
Patient left without being seen.

## 2018-02-05 ENCOUNTER — Other Ambulatory Visit: Payer: Self-pay

## 2018-02-05 ENCOUNTER — Ambulatory Visit: Payer: Self-pay

## 2018-02-07 ENCOUNTER — Encounter (HOSPITAL_COMMUNITY): Payer: Self-pay | Admitting: Emergency Medicine

## 2018-02-07 ENCOUNTER — Inpatient Hospital Stay (HOSPITAL_COMMUNITY)
Admission: EM | Admit: 2018-02-07 | Discharge: 2018-02-14 | DRG: 190 | Disposition: A | Discharge status: Home / Self Care | Payer: Self-pay | Attending: Family Medicine | Admitting: Family Medicine

## 2018-02-07 ENCOUNTER — Emergency Department (HOSPITAL_COMMUNITY): Payer: Self-pay

## 2018-02-07 ENCOUNTER — Other Ambulatory Visit: Payer: Self-pay

## 2018-02-07 DIAGNOSIS — I272 Pulmonary hypertension, unspecified: Secondary | ICD-10-CM | POA: Diagnosis present

## 2018-02-07 DIAGNOSIS — I110 Hypertensive heart disease with heart failure: Secondary | ICD-10-CM | POA: Diagnosis present

## 2018-02-07 DIAGNOSIS — Z7952 Long term (current) use of systemic steroids: Secondary | ICD-10-CM

## 2018-02-07 DIAGNOSIS — J9620 Acute and chronic respiratory failure, unspecified whether with hypoxia or hypercapnia: Secondary | ICD-10-CM

## 2018-02-07 DIAGNOSIS — J948 Other specified pleural conditions: Secondary | ICD-10-CM | POA: Diagnosis present

## 2018-02-07 DIAGNOSIS — I2721 Secondary pulmonary arterial hypertension: Secondary | ICD-10-CM | POA: Diagnosis present

## 2018-02-07 DIAGNOSIS — I519 Heart disease, unspecified: Secondary | ICD-10-CM | POA: Diagnosis present

## 2018-02-07 DIAGNOSIS — J9601 Acute respiratory failure with hypoxia: Secondary | ICD-10-CM

## 2018-02-07 DIAGNOSIS — J9611 Chronic respiratory failure with hypoxia: Secondary | ICD-10-CM | POA: Diagnosis present

## 2018-02-07 DIAGNOSIS — R7989 Other specified abnormal findings of blood chemistry: Secondary | ICD-10-CM | POA: Diagnosis present

## 2018-02-07 DIAGNOSIS — Z8709 Personal history of other diseases of the respiratory system: Secondary | ICD-10-CM

## 2018-02-07 DIAGNOSIS — J9621 Acute and chronic respiratory failure with hypoxia: Secondary | ICD-10-CM | POA: Diagnosis present

## 2018-02-07 DIAGNOSIS — I2781 Cor pulmonale (chronic): Secondary | ICD-10-CM | POA: Diagnosis present

## 2018-02-07 DIAGNOSIS — M7918 Myalgia, other site: Secondary | ICD-10-CM | POA: Diagnosis present

## 2018-02-07 DIAGNOSIS — I5032 Chronic diastolic (congestive) heart failure: Secondary | ICD-10-CM | POA: Diagnosis present

## 2018-02-07 DIAGNOSIS — J189 Pneumonia, unspecified organism: Secondary | ICD-10-CM | POA: Diagnosis present

## 2018-02-07 DIAGNOSIS — I5189 Other ill-defined heart diseases: Secondary | ICD-10-CM | POA: Diagnosis present

## 2018-02-07 DIAGNOSIS — J471 Bronchiectasis with (acute) exacerbation: Principal | ICD-10-CM | POA: Diagnosis present

## 2018-02-07 DIAGNOSIS — I959 Hypotension, unspecified: Secondary | ICD-10-CM | POA: Diagnosis present

## 2018-02-07 DIAGNOSIS — R740 Nonspecific elevation of levels of transaminase and lactic acid dehydrogenase [LDH]: Secondary | ICD-10-CM | POA: Diagnosis present

## 2018-02-07 DIAGNOSIS — K5900 Constipation, unspecified: Secondary | ICD-10-CM | POA: Diagnosis present

## 2018-02-07 DIAGNOSIS — Z8701 Personal history of pneumonia (recurrent): Secondary | ICD-10-CM

## 2018-02-07 DIAGNOSIS — J449 Chronic obstructive pulmonary disease, unspecified: Secondary | ICD-10-CM | POA: Diagnosis present

## 2018-02-07 DIAGNOSIS — J90 Pleural effusion, not elsewhere classified: Secondary | ICD-10-CM | POA: Diagnosis present

## 2018-02-07 DIAGNOSIS — Z9981 Dependence on supplemental oxygen: Secondary | ICD-10-CM

## 2018-02-07 DIAGNOSIS — C9000 Multiple myeloma not having achieved remission: Secondary | ICD-10-CM | POA: Diagnosis present

## 2018-02-07 DIAGNOSIS — Z7982 Long term (current) use of aspirin: Secondary | ICD-10-CM

## 2018-02-07 DIAGNOSIS — R918 Other nonspecific abnormal finding of lung field: Secondary | ICD-10-CM | POA: Diagnosis present

## 2018-02-07 DIAGNOSIS — Z8249 Family history of ischemic heart disease and other diseases of the circulatory system: Secondary | ICD-10-CM

## 2018-02-07 DIAGNOSIS — I471 Supraventricular tachycardia: Secondary | ICD-10-CM | POA: Diagnosis not present

## 2018-02-07 HISTORY — DX: Acute and chronic respiratory failure, unspecified whether with hypoxia or hypercapnia: J96.20

## 2018-02-07 LAB — CBC
HEMATOCRIT: 32.9 % — AB (ref 39.0–52.0)
HEMOGLOBIN: 10.2 g/dL — AB (ref 13.0–17.0)
MCH: 23.7 pg — AB (ref 26.0–34.0)
MCHC: 31 g/dL (ref 30.0–36.0)
MCV: 76.5 fL — ABNORMAL LOW (ref 78.0–100.0)
Platelets: 598 10*3/uL — ABNORMAL HIGH (ref 150–400)
RBC: 4.3 MIL/uL (ref 4.22–5.81)
RDW: 16.4 % — ABNORMAL HIGH (ref 11.5–15.5)
WBC: 10.4 10*3/uL (ref 4.0–10.5)

## 2018-02-07 LAB — RESPIRATORY PANEL BY PCR
ADENOVIRUS-RVPPCR: NOT DETECTED
BORDETELLA PERTUSSIS-RVPCR: NOT DETECTED
CHLAMYDOPHILA PNEUMONIAE-RVPPCR: NOT DETECTED
CORONAVIRUS 229E-RVPPCR: NOT DETECTED
CORONAVIRUS HKU1-RVPPCR: NOT DETECTED
CORONAVIRUS NL63-RVPPCR: NOT DETECTED
Coronavirus OC43: NOT DETECTED
Influenza A: NOT DETECTED
Influenza B: NOT DETECTED
METAPNEUMOVIRUS-RVPPCR: NOT DETECTED
Mycoplasma pneumoniae: NOT DETECTED
PARAINFLUENZA VIRUS 2-RVPPCR: NOT DETECTED
PARAINFLUENZA VIRUS 3-RVPPCR: NOT DETECTED
Parainfluenza Virus 1: NOT DETECTED
Parainfluenza Virus 4: NOT DETECTED
RHINOVIRUS / ENTEROVIRUS - RVPPCR: NOT DETECTED
Respiratory Syncytial Virus: NOT DETECTED

## 2018-02-07 LAB — BASIC METABOLIC PANEL
ANION GAP: 10 (ref 5–15)
BUN: <5 mg/dL — ABNORMAL LOW (ref 6–20)
CO2: 30 mmol/L (ref 22–32)
Calcium: 8.5 mg/dL — ABNORMAL LOW (ref 8.9–10.3)
Chloride: 95 mmol/L — ABNORMAL LOW (ref 101–111)
Creatinine, Ser: 0.65 mg/dL (ref 0.61–1.24)
GLUCOSE: 88 mg/dL (ref 65–99)
POTASSIUM: 4.3 mmol/L (ref 3.5–5.1)
Sodium: 135 mmol/L (ref 135–145)

## 2018-02-07 LAB — CBC WITH DIFFERENTIAL/PLATELET
Basophils Absolute: 0 10*3/uL (ref 0.0–0.1)
Basophils Relative: 0 %
Eosinophils Absolute: 0.5 10*3/uL (ref 0.0–0.7)
Eosinophils Relative: 4 %
HEMATOCRIT: 30.7 % — AB (ref 39.0–52.0)
HEMOGLOBIN: 9.5 g/dL — AB (ref 13.0–17.0)
LYMPHS PCT: 13 %
Lymphs Abs: 1.7 10*3/uL (ref 0.7–4.0)
MCH: 23.7 pg — ABNORMAL LOW (ref 26.0–34.0)
MCHC: 30.9 g/dL (ref 30.0–36.0)
MCV: 76.6 fL — AB (ref 78.0–100.0)
MONO ABS: 1 10*3/uL (ref 0.1–1.0)
Monocytes Relative: 7 %
NEUTROS ABS: 10.4 10*3/uL — AB (ref 1.7–7.7)
NEUTROS PCT: 76 %
Platelets: 639 10*3/uL — ABNORMAL HIGH (ref 150–400)
RBC: 4.01 MIL/uL — ABNORMAL LOW (ref 4.22–5.81)
RDW: 16.6 % — AB (ref 11.5–15.5)
WBC: 13.7 10*3/uL — ABNORMAL HIGH (ref 4.0–10.5)

## 2018-02-07 LAB — CREATININE, SERUM
Creatinine, Ser: 0.65 mg/dL (ref 0.61–1.24)
GFR calc Af Amer: >60 mL/min (ref >60)
GFR calc non Af Amer: >60 mL/min (ref >60)

## 2018-02-07 LAB — INFLUENZA PANEL BY PCR (TYPE A & B)
INFLAPCR: NEGATIVE
INFLBPCR: NEGATIVE

## 2018-02-07 LAB — I-STAT CG4 LACTIC ACID, ED: LACTIC ACID, VENOUS: 0.84 mmol/L (ref 0.5–1.9)

## 2018-02-07 MED ORDER — SODIUM CHLORIDE 0.9 % IV BOLUS
500.0000 mL | Freq: Once | INTRAVENOUS | Status: AC
  Administered 2018-02-07: 21:00:00 via INTRAVENOUS

## 2018-02-07 MED ORDER — TADALAFIL (PAH) 20 MG PO TABS
40.0000 mg | ORAL_TABLET | Freq: Every day | ORAL | Status: DC
  Administered 2018-02-07: 40 mg via ORAL
  Filled 2018-02-07 (×3): qty 2

## 2018-02-07 MED ORDER — SODIUM CHLORIDE 0.9% FLUSH
3.0000 mL | Freq: Two times a day (BID) | INTRAVENOUS | Status: DC
  Administered 2018-02-07 – 2018-02-14 (×11): 3 mL via INTRAVENOUS

## 2018-02-07 MED ORDER — ASPIRIN EC 325 MG PO TBEC
325.0000 mg | DELAYED_RELEASE_TABLET | Freq: Every day | ORAL | Status: DC
  Administered 2018-02-07 – 2018-02-14 (×8): 325 mg via ORAL
  Filled 2018-02-07 (×8): qty 1

## 2018-02-07 MED ORDER — SODIUM CHLORIDE 0.9 % IV SOLN: 250.0000 mL | INTRAVENOUS | Status: DC | PRN

## 2018-02-07 MED ORDER — POTASSIUM CHLORIDE IN NACL 20-0.9 MEQ/L-% IV SOLN
INTRAVENOUS | Status: DC
  Administered 2018-02-07 – 2018-02-08 (×2): via INTRAVENOUS
  Filled 2018-02-07 (×3): qty 1000

## 2018-02-07 MED ORDER — ENOXAPARIN SODIUM 30 MG/0.3ML ~~LOC~~ SOLN
30.0000 mg | SUBCUTANEOUS | Status: DC
  Administered 2018-02-07 – 2018-02-13 (×6): 30 mg via SUBCUTANEOUS
  Filled 2018-02-07 (×6): qty 0.3

## 2018-02-07 MED ORDER — VANCOMYCIN HCL IN DEXTROSE 1-5 GM/200ML-% IV SOLN
1000.0000 mg | Freq: Once | INTRAVENOUS | Status: AC
  Administered 2018-02-07: 1000 mg via INTRAVENOUS
  Filled 2018-02-07: qty 200

## 2018-02-07 MED ORDER — VANCOMYCIN HCL 500 MG IV SOLR
500.0000 mg | Freq: Two times a day (BID) | INTRAVENOUS | Status: DC
  Administered 2018-02-08 (×2): 500 mg via INTRAVENOUS
  Filled 2018-02-07 (×3): qty 500

## 2018-02-07 MED ORDER — ALBUTEROL SULFATE (2.5 MG/3ML) 0.083% IN NEBU
3.0000 mL | INHALATION_SOLUTION | Freq: Four times a day (QID) | RESPIRATORY_TRACT | Status: DC | PRN
Start: 2018-02-07 — End: 2018-02-14

## 2018-02-07 MED ORDER — SODIUM CHLORIDE 0.9% FLUSH: 3.0000 mL | INTRAVENOUS | Status: DC | PRN

## 2018-02-07 MED ORDER — SODIUM CHLORIDE 0.9 % IV SOLN
1.0000 g | Freq: Once | INTRAVENOUS | Status: AC
  Administered 2018-02-07: 1 g via INTRAVENOUS
  Filled 2018-02-07: qty 1

## 2018-02-07 MED ORDER — LENALIDOMIDE 25 MG PO CAPS: 25.0000 mg | ORAL_CAPSULE | Freq: Every day | ORAL | Status: DC

## 2018-02-07 MED ORDER — UMECLIDINIUM-VILANTEROL 62.5-25 MCG/INH IN AEPB
1.0000 | INHALATION_SPRAY | Freq: Every day | RESPIRATORY_TRACT | Status: DC
  Administered 2018-02-08 – 2018-02-14 (×5): 1 via RESPIRATORY_TRACT
  Filled 2018-02-07 (×2): qty 14

## 2018-02-07 MED ORDER — ONDANSETRON HCL 4 MG PO TABS: 8.0000 mg | ORAL_TABLET | Freq: Two times a day (BID) | ORAL | Status: DC | PRN

## 2018-02-07 MED ORDER — SODIUM CHLORIDE 0.9 % IV SOLN
1.0000 g | Freq: Three times a day (TID) | INTRAVENOUS | Status: DC
  Administered 2018-02-07 – 2018-02-08 (×4): 1 g via INTRAVENOUS
  Filled 2018-02-07 (×7): qty 1

## 2018-02-07 NOTE — H&P (Signed)
History and Physical    [name] [name] XVQ:[id number] DOB: 09/29/1961 DOA: 02/07/2018  PCP: Patient, No Pcp Per patient will need a primary care provider at discharge Patient coming from: Home via EMS  I have personally briefly reviewed patient's old medical records in [facility]  Chief Complaint: Respiratory distress brought in by EMS  HPI: [name] [name] is a 57 y.o. male with medical history significant of pulmonary arterial hypertension with PA peak pressure of 73, moderate RV dilatation by echo on 01/21/2018 with an EF of 55-60% and grade 2 diastolic dysfunction (moderate) and severe pulmonary hypertension, R ISS stage II multiple myeloma status post 3 cycles of lenalidomide bortezomib and dexamethasone and Gold stage IV COPD with no smoking history as well as a history of left pleural empyema requiring VATS in 2016 who comes in with acute respiratory distress brought in by EMS.  History is limited by the patient being on BiPAP and his respiratory distress.  This is a level 5 caveat due to acuity of condition.  This was called out there for shortness of breath on arrival he was hypoxic in the 80s and placed on CPAP with improvement.  He was given nebulizers as well as Solu-Medrol and magnesium.  The patient was recently discharged from our hospital at the end of March due to pneumonia.  In the emergency department he was transitioned to BiPAP with improvement.  Chest x-ray showed right upper lobe possible residual pneumonia.  Noncontrast CT scan of the chest was obtained and showed diffuse patchy infiltrates of the right upper lobe consistent with acute pneumonia.  Given the patient's acute on chronic respiratory failure he will be admitted into the hospital for further evaluation and management.  He will need stepdown care due to the severity of his hypoxemia and his current need for BiPAP.   Review of Systems: Unable as patient in extremis and unable to answer questions  Past Medical History:    Diagnosis Date  . Acute on chronic respiratory failure ([place]) 02/07/2018  . Hypertension   . Kidney stones   . Pulmonary hypertension ([place])     Past Surgical History:  Procedure Laterality Date  . CARDIAC CATHETERIZATION N/A 01/18/2016   Procedure: Right Heart Cath;  Surgeon: Jolaine Artist, MD;  Location: [facility];  Service: Cardiovascular;  Laterality: N/A;  . CARDIAC CATHETERIZATION N/A 06/24/2016   Procedure: Right Heart Cath;  Surgeon: Jolaine Artist, MD;  Location: [facility];  Service: Cardiovascular;  Laterality: N/A;  . KIDNEY SURGERY    . VIDEO ASSISTED THORACOSCOPY (VATS)/DECORTICATION Left 2003  . VIDEO ASSISTED THORACOSCOPY (VATS)/DECORTICATION Left 10/02/2015   Procedure: VIDEO ASSISTED THORACOSCOPY (VATS)/DECORTICATION and drainage of chronic empyena;  Surgeon: [name], MD;  Location: [facility];  Service: Thoracic;  Laterality: Left;  Marland Kitchen VIDEO BRONCHOSCOPY N/A 10/02/2015   Procedure: VIDEO BRONCHOSCOPY;  Surgeon: [name], MD;  Location: [facility];  Service: Thoracic;  Laterality: N/A;     reports that he has never smoked. He has never used smokeless tobacco. He reports that he drinks alcohol. He reports that he does not use drugs.  No Known Allergies  Family History  Problem Relation Age of Onset  . Hypertension Other      Prior to Admission medications   Medication Sig Start Date End Date Taking? Authorizing Provider  albuterol (PROVENTIL HFA;VENTOLIN HFA) 108 (90 Base) MCG/ACT inhaler Inhale 2 puffs into the lungs every 6 (six) hours as needed for wheezing or  shortness of breath. 07/28/17  Yes Tanda Rockers, MD  aspirin EC 325 MG EC tablet Take 1 tablet (325 mg total) by mouth daily. 11/09/16  Yes Rai, Ripudeep K, MD  DM-Doxylamine-Acetaminophen (NYQUIL HBP COLD & FLU) 15-6.25-325 MG/15ML LIQD Take 15 mLs by mouth at bedtime as needed (sleep).   Yes [provider]  lenalidomide (REVLIMID) 25 MG capsule Take 1 capsule (25  mg total) by mouth daily for 14 days. 02/03/18 02/17/18 Yes Perlov, Marinell Blight, MD  levofloxacin (LEVAQUIN) 500 MG tablet Take 1 tablet (500 mg total) by mouth daily. 01/29/18  Yes Charlynne Cousins, MD  metroNIDAZOLE (FLAGYL) 500 MG tablet Take 1 tablet (500 mg total) by mouth 3 (three) times daily for 10 days. 01/29/18 02/08/18 Yes Charlynne Cousins, MD  ondansetron (ZOFRAN) 8 MG tablet Take 1 tablet (8 mg total) by mouth 2 (two) times daily as needed (Nausea or vomiting). 11/08/17  Yes Brunetta Genera, MD  OXYGEN 2lpm BEDTIME ONLY   Yes [provider]  tadalafil, PAH, (ADCIRCA) 20 MG tablet Take 2 tablets (40 mg total) by mouth daily. Patient taking differently: Take 20 mg by mouth daily.  08/26/17  Yes McQuaid, Ronie Spies, MD  umeclidinium-vilanterol (ANORO ELLIPTA) 62.5-25 MCG/INH AEPB Inhale 1 puff into the lungs daily. 08/26/17  Yes Juanito Doom, MD  dexamethasone (DECADRON) 4 MG tablet Take 5 tablets (20 mg total) by mouth once a week. 11/14/17   Brunetta Genera, MD    Physical Exam: Vitals:   02/07/18 0758 02/07/18 0800 02/07/18 0815 02/07/18 0859  BP:      Pulse: 72   72  Resp: (!) 30   18  Temp:  (!) 97.4 F (36.3 C)    TempSrc:  Rectal    SpO2: 97%  97% 97%   .TCS Constitutional: Acutely ill-appearing with increased work of breathing on BiPAP. Vitals:   02/07/18 0758 02/07/18 0800 02/07/18 0815 02/07/18 0859  BP:      Pulse: 72   72  Resp: (!) 30   18  Temp:  (!) 97.4 F (36.3 C)    TempSrc:  Rectal    SpO2: 97%  97% 97%   Eyes: PERRL, lids and conjunctival are pale sclera are injected ENMT: Mucous membranes are moist. Posterior pharynx clear of any exudate or lesions.Normal dentition.  Neck: normal, supple, no masses, no thyromegaly Respiratory: Coarse difficult to auscultate breath sounds bilaterally wheezing and tachypnea Cardiovascular: Regular rate and rhythm, no murmurs / rubs / gallops. No extremity edema. 2+ pedal pulses. No carotid  bruits.  Abdomen: no tenderness, no masses palpated. No hepatosplenomegaly. Bowel sounds positive.  Musculoskeletal: no clubbing / cyanosis. No joint deformity upper and lower extremities. Good ROM, no contractures. Normal muscle tone.  Skin: no rashes, lesions, ulcers. No induration Neurologic: CN 2-12 grossly intact. Sensation intact, DTR normal. Strength 5/5 in all 4.  Psychiatric:  Alert and oriented x 3. Normal mood.    Labs on Admission: I have personally reviewed following labs and imaging studies  CBC: Recent Labs  Lab 02/07/18 0622  WBC 13.7*  NEUTROABS 10.4*  HGB 9.5*  HCT 30.7*  MCV 76.6*  PLT 761*   Basic Metabolic Panel: Recent Labs  Lab 02/07/18 0622  NA 135  K 4.3  CL 95*  CO2 30  GLUCOSE 88  BUN <5*  CREATININE 0.65  CALCIUM 8.5*   GFR: Estimated Creatinine Clearance: 64.9 mL/min (by C-G formula based on SCr of 0.65 mg/dL). Urine  analysis:    Component Value Date/Time   COLORURINE YELLOW 12/22/2017 [place] 12/22/2017 1323   LABSPEC 1.011 12/22/2017 1323   PHURINE 8.0 12/22/2017 1323   GLUCOSEU NEGATIVE 12/22/2017 1323   HGBUR NEGATIVE 12/22/2017 1323   BILIRUBINUR NEGATIVE 12/22/2017 1323   KETONESUR NEGATIVE 12/22/2017 1323   PROTEINUR NEGATIVE 12/22/2017 1323   UROBILINOGEN 0.2 11/11/2012 0145   NITRITE NEGATIVE 12/22/2017 1323   LEUKOCYTESUR NEGATIVE 12/22/2017 1323    Radiological Exams on Admission: Ct Chest Wo Contrast  Result Date: 02/07/2018 CLINICAL DATA:  Shortness of breath and wheezing EXAM: CT CHEST WITHOUT CONTRAST TECHNIQUE: Multidetector CT imaging of the chest was performed following the standard protocol without IV contrast. COMPARISON:  Plain film from earlier in the same day., CT from 01/12/2018 FINDINGS: Cardiovascular: Somewhat limited due to the lack of IV contrast. Aortic atherosclerotic changes are noted. No aneurysmal dilatation is seen. The pulmonary artery is enlarged with the main pulmonary trunk  measuring approximately 4.8 cm. Mild coronary calcifications are noted. Mediastinum/Nodes: The esophagus is within normal limits. No significant hilar or mediastinal adenopathy is noted. The thoracic inlet is within normal limits. Lungs/Pleura: Emphysematous changes are seen. A chronic air-fluid collection in the left pleural space is again identified and stable in appearance. Some chronic associated consolidation in the left lower lobe is noted as well. Some mild scarring is noted in the residual aerated left lung. Patchy infiltrative changes are noted in the right upper lobe significantly increased from the prior CT but similar to that seen on recent plain film examination. Some chronic scarring in the right lower lobe is noted. Diffuse calcified pleural plaques are noted on the right stable from the prior exam. A few calcified plaques are noted on the left but to a significantly lesser degree than that on the right. No sizable effusion is noted. Upper Abdomen: Within normal limits. Musculoskeletal: No acute bony abnormality is noted. IMPRESSION: Diffuse patchy infiltrate throughout the right upper lobe consistent with acute pneumonia. Chronic calcified pleural plaques bilaterally right greater than left. Chronic but stable air-fluid collection in the left pleural space inferiorly with associated consolidation. The overall appearance is stable from the prior exam. Changes of chronic pulmonary arterial enlargement consistent with hypertension. Aortic Atherosclerosis (ICD10-I70.0) and Emphysema (ICD10-J43.9). Electronically Signed   By: Inez Catalina M.D.   On: 02/07/2018 08:08   Dg Chest Port 1 View  Result Date: 02/07/2018 CLINICAL DATA:  57 y/o M; acute onset respiratory distress. History of multiple myeloma. EXAM: PORTABLE CHEST 1 VIEW COMPARISON:  01/26/2018 chest radiograph.  01/12/2018 chest CT. FINDINGS: Stable cardiomegaly given projection and technique. Stable enlargement of pulmonary arteries compatible  pulmonary artery hypertension. Stable left large pleural collection and pleural thickening. Patchy opacities in the right upper lobe are mildly increased. No acute osseous abnormality is evident. IMPRESSION: Increased patchy opacities in the right upper lobe may represent residual/recurrent pneumonia. Stable left-sided pleural collection and thickening. Stable cardiac silhouette. Electronically Signed   By: Kristine Garbe M.D.   On: 02/07/2018 06:43    EKG: Independently reviewed.  Sinus rhythm with low voltage and right axis deviation.  Abnormal Q waves.  Assessment/Plan Principal Problem:   Acute on chronic respiratory failure with hypoxia (HCC) Active Problems:   Facility-acquired pneumonia   Pleural effusion, left   Pulmonary HTN (HCC)   COPD GOLD IV criteria but never smoked    Multiple myeloma (HCC)   High serum lactate   Grade II diastolic dysfunction   1.  Acute on chronic respiratory failure with hypoxia: Will require admission to the stepdown unit given the severity of his hypoxia and his extremis on presentation.  We will continue BiPAP and attempt to wean as able.  Patient will receive nebulizers, steroids, and IV antibiotics.  His elevated white blood cell count is suggestive of recurrent pneumonia as his chest CT.  2.  Facility acquired pneumonia: Patient's pneumonia appeared to improve at discharge he now presents with what appears to be a recurrent or a new pneumonia.  He will be treated for hospital-acquired infection with vancomycin and cefepime.  Monitor response to treatment following white blood cell count and hopeful resolution of hypoxemia.  3.  Left-sided pleural effusion: Patient history of VATS with decortication in November 2016.  Left pleural effusion has not changed appreciably.  Continue to monitor her vital oxygen supplementation.  4.  Pulmonary hypertension: Patient with severe pulmonary hypertension with a pulmonary artery pressure of 73.  Etiology is  unclear.  He does not have any peripheral edema and is currently not exhibiting any signs of volume overload due to pulmonary hypertension requiring IV Lasix.  We will continue treatment with oxygen, home medication (tadalafil) and supportive care.  5.  COPD Gold 4 with mild acute exacerbation but never smoked: As above will use nebulizers steroids oxygen and antibiotics.  6.  Multiple myeloma: Patient last seen by Dr. Lebron Conners of oncology in the hospital on 325.  Was to hold systemic therapy for another 7 days and he patient was due to return to his office on April 8.  Plan was for labs clinical evaluation and possible reinitiation of systemic antineoplastic therapy.  Would recommend consultation with hematology oncology on Monday.  7.  High serum lactate: Patient acutely ill but does not meet criteria for sepsis at this point he will receive IV fluids we will recheck lactate in 4 hours.  8.  Grade 2 diastolic dysfunction without evidence of decompensated congestive heart failure: We will continue to monitor.  Patient currently does not require active treatment.  Recent echocardiogram done at the end of March is as above.    DVT prophylaxis: Lovenox Code Status: Full code Family Communication: No family present on admission. Disposition Plan: Likely home in 3-4 days Consults called: None Admission status: Inpatient   Lady Deutscher MD FACP [facility] Pager [phone]  If 7PM-7AM, please contact night-coverage [email or web address] Password [facility]  02/07/2018, 9:14 AM

## 2018-02-07 NOTE — ED Notes (Signed)
Nephew Teal would like to be updated with changes [phone]

## 2018-02-07 NOTE — ED Triage Notes (Signed)
Patient from home with history of bone CA.  Called EMS for respiratory distress, acute onset, not able to speak at all upon EMS arrival.  Patient was started on CPAP and given 125mg  Solumedrol, 2g Magnesium, 5mg  Albuterol.

## 2018-02-07 NOTE — ED Provider Notes (Signed)
[facility] DEPART[facility] Provider Note   CSN: [id number] Arrival date & time: 02/07/18  0603     History   Chief Complaint Chief Complaint  Patient presents with  . Respiratory Distress  Level 5 caveat due to acuity of condition  HPI [name] [name] is a 57 y.o. male.  The history is provided by the EMS personnel. The history is limited by the condition of the patient.  Shortness of Breath  This is a new problem. The problem occurs continuously.The problem has been rapidly worsening. Associated symptoms include cough and wheezing. Treatments tried: CPAP. The treatment provided significant relief.  Patient with history of COPD, multiple myeloma, recent case of pneumonia presents with shortness of breath.  Per EMS they were called for shortness of breath.  On arrival he was hypoxic in the 80s, was placed on CPAP with improvement.  He was also given nebulizers as well as Solu-Medrol magnesium.  Patient was recently in the hospital for pneumonia.  No other details known at this time  Past Medical History:  Diagnosis Date  . Hypertension   . Kidney stones   . Pulmonary hypertension [facility])     Patient Active Problem List   Diagnosis Date Noted  . Acute on chronic respiratory failure with hypoxia ([place]) 01/27/2018  . Counseling regarding advanced care planning and goals of care 11/08/2017  . Multiple myeloma ([place]) 11/07/2017  . Community acquired pneumonia   . Chronic respiratory failure with hypoxia ([place]) 03/26/2016  . COPD GOLD IV criteria but never smoked  03/11/2016  . Pulmonary HTN ([place])   . Hyperkalemia   . Diarrhea 09/27/2015  . Chest pain 09/27/2015  . Unintentional weight loss 09/27/2015  . Abdominal cramps 09/26/2015  . Pleural effusion, left 09/26/2015    Past Surgical History:  Procedure Laterality Date  . CARDIAC CATHETERIZATION N/A 01/18/2016   Procedure: Right Heart Cath;  Surgeon: Jolaine Artist, MD;  Location: [facility];   Service: Cardiovascular;  Laterality: N/A;  . CARDIAC CATHETERIZATION N/A 06/24/2016   Procedure: Right Heart Cath;  Surgeon: Jolaine Artist, MD;  Location: [facility];  Service: Cardiovascular;  Laterality: N/A;  . KIDNEY SURGERY    . VIDEO ASSISTED THORACOSCOPY (VATS)/DECORTICATION Left 2003  . VIDEO ASSISTED THORACOSCOPY (VATS)/DECORTICATION Left 10/02/2015   Procedure: VIDEO ASSISTED THORACOSCOPY (VATS)/DECORTICATION and drainage of chronic empyena;  Surgeon: [name], MD;  Location: [facility];  Service: Thoracic;  Laterality: Left;  Marland Kitchen VIDEO BRONCHOSCOPY N/A 10/02/2015   Procedure: VIDEO BRONCHOSCOPY;  Surgeon: [name], MD;  Location[facility];  Service: Thoracic;  Laterality: N/A;        Home Medications    Prior to Admission medications   Medication Sig Start Date End Date Taking? Authorizing Provider  albuterol (PROVENTIL HFA;VENTOLIN HFA) 108 (90 Base) MCG/ACT inhaler Inhale 2 puffs into the lungs every 6 (six) hours as needed for wheezing or shortness of breath. 07/28/17   Tanda Rockers, MD  aspirin EC 325 MG EC tablet Take 1 tablet (325 mg total) by mouth daily. 11/09/16   Rai, Ripudeep K, MD  dexamethasone (DECADRON) 4 MG tablet Take 5 tablets (20 mg total) by mouth once a week. 11/14/17   Brunetta Genera, MD  lenalidomide (REVLIMID) 25 MG capsule Take 1 capsule (25 mg total) by mouth daily for 14 days. 02/03/18 02/17/18  Ardath Sax, MD  levofloxacin (LEVAQUIN) 500 MG tablet Take 1 tablet (500 mg total) by mouth daily.  01/29/18   Charlynne Cousins, MD  metroNIDAZOLE (FLAGYL) 500 MG tablet Take 1 tablet (500 mg total) by mouth 3 (three) times daily for 10 days. 01/29/18 02/08/18  Charlynne Cousins, MD  ondansetron (ZOFRAN) 8 MG tablet Take 1 tablet (8 mg total) by mouth 2 (two) times daily as needed (Nausea or vomiting). 11/08/17   Brunetta Genera, MD  OXYGEN 2lpm BEDTIME ONLY    [provider]  tadalafil, PAH, (ADCIRCA) 20 MG tablet  Take 2 tablets (40 mg total) by mouth daily. 08/26/17   Juanito Doom, MD  umeclidinium-vilanterol (ANORO ELLIPTA) 62.5-25 MCG/INH AEPB Inhale 1 puff into the lungs daily. 08/26/17   Juanito Doom, MD    Family History Family History  Problem Relation Age of Onset  . Hypertension Other     Social History Social History   Tobacco Use  . Smoking status: Never Smoker  . Smokeless tobacco: Never Used  Substance Use Topics  . Alcohol use: Yes  . Drug use: No     Allergies   Patient has no known allergies.   Review of Systems Review of Systems  Unable to perform ROS: Acuity of condition  Respiratory: Positive for cough, shortness of breath and wheezing.      Physical Exam Updated Vital Signs BP 97/65   Pulse 68   Temp 97.7 F (36.5 C)   Resp 20   SpO2 100%   Physical Exam CONSTITUTIONAL: Chronically ill-appearing HEAD: Normocephalic/atraumatic EYES: EOMI ENMT: Mucous membranes moist, CPAP mask in place NECK: supple no meningeal signs SPINE/BACK:entire spine nontender CV: S1/S2 noted, no murmurs/rubs/gallops noted LUNGS: Tachypnea and wheezing noted bilaterally ABDOMEN: soft, nontender GU:no cva tenderness NEURO: Pt is awake/alert/appropriate, moves all extremitiesx4.  EXTREMITIES: pulses normal/equal, full ROM SKIN: warm, color normal PSYCH: Unable to assess  ED Treatments / Results  Labs (all labs ordered are listed, but only abnormal results are displayed) Labs Reviewed  BASIC METABOLIC PANEL - Abnormal; Notable for the following components:      Result Value   Chloride 95 (*)    BUN <5 (*)    Calcium 8.5 (*)    All other components within normal limits  CBC WITH DIFFERENTIAL/PLATELET - Abnormal; Notable for the following components:   WBC 13.7 (*)    RBC 4.01 (*)    Hemoglobin 9.5 (*)    HCT 30.7 (*)    MCV 76.6 (*)    MCH 23.7 (*)    RDW 16.6 (*)    Platelets 639 (*)    Neutro Abs 10.4 (*)    All other components within normal  limits  CULTURE, BLOOD (ROUTINE X 2)  CULTURE, BLOOD (ROUTINE X 2)  I-STAT CG4 LACTIC ACID, ED    EKG EKG Interpretation  Date/Time:  Saturday February 07 2018 06:11:13 EDT Ventricular Rate:  79 PR Interval:    QRS Duration: 84 QT Interval:  399 QTC Calculation: 458 R Axis:   110 Text Interpretation:  Sinus rhythm Low voltage with right axis deviation Abnormal lateral Q waves No significant change since last tracing Confirmed by Ripley Fraise ([id number]) on 02/07/2018 6:19:57 AM   Radiology Dg Chest Port 1 View  Result Date: 02/07/2018 CLINICAL DATA:  57 y/o M; acute onset respiratory distress. History of multiple myeloma. EXAM: PORTABLE CHEST 1 VIEW COMPARISON:  01/26/2018 chest radiograph.  01/12/2018 chest CT. FINDINGS: Stable cardiomegaly given projection and technique. Stable enlargement of pulmonary arteries compatible pulmonary artery hypertension. Stable left large pleural collection and pleural  thickening. Patchy opacities in the right upper lobe are mildly increased. No acute osseous abnormality is evident. IMPRESSION: Increased patchy opacities in the right upper lobe may represent residual/recurrent pneumonia. Stable left-sided pleural collection and thickening. Stable cardiac silhouette. Electronically Signed   By: Kristine Garbe M.D.   On: 02/07/2018 06:43    Procedures Procedures  CRITICAL CARE Performed by: Sharyon Cable Total critical care time: 35 minutes Critical care time was exclusive of separately billable procedures and treating other patients. Critical care was necessary to treat or prevent imminent or life-threatening deterioration. Critical care was time spent personally by me on the following activities: development of treatment plan with patient and/or surrogate as well as nursing, discussions with consultants, evaluation of patient's response to treatment, examination of patient, obtaining history from patient or surrogate, ordering and performing  treatments and interventions, ordering and review of laboratory studies, ordering and review of radiographic studies, pulse oximetry and re-evaluation of patient's condition. Patient on BiPAP, require admission to the hospital.  Patient with respiratory failure Medications Ordered in ED Medications  ceFEPIme (MAXIPIME) 1 g in sodium chloride 0.9 % 100 mL IVPB (has no administration in time range)  vancomycin (VANCOCIN) IVPB 1000 mg/200 mL premix (has no administration in time range)     Initial Impression / Assessment and Plan / ED Course  I have reviewed the triage vital signs and the nursing notes.  Pertinent labs & imaging results that were available during my care of the patient were reviewed by me and considered in my medical decision making (see chart for details).     7:08 AM He is seen on arrival for shortness of breath.  He was transitioned to BiPAP with improvement.  He is due to have residual pneumonia as well as elevated white count.  He will need to be admitted the hospital.  Lactate normal, but will send blood cultures and start IV antibiotics. 7:35 AM Patient improved at this time.  Vital signs are appropriate.  No hypoxia noted.  Blood pressure is near baseline.  Discussed with Dr. Evangeline Gula with triad Plan to admit to hospital. She does request CT chest without contrast due to recurrent pneumonia  Patient awaiting admission Final Clinical Impressions(s) / ED Diagnoses   Final diagnoses:  HCAP (healthcare-associated pneumonia)  Acute respiratory failure with hypoxia [facility])    ED Discharge Orders    None       Ripley Fraise, MD 02/07/18 [phone]

## 2018-02-07 NOTE — Progress Notes (Signed)
Patient, [name] [name], admitted to rrom 3E09 from ED.  He denies pain.  Is on droplet precautions pending flu PCR results.  Patient oriented to room and unit routines.

## 2018-02-07 NOTE — Progress Notes (Signed)
ANTIBIOTIC CONSULT NOTE - INITIAL  Pharmacy Consult for vancomycin Indication: pneumonia  No Known Allergies  Patient Measurements:   Adjusted Body Weight:    Vital Signs: Temp: 97.4 F (36.3 C) (04/06 0800) Temp Source: Rectal (04/06 0800) BP: 102/65 (04/06 0745) Pulse Rate: 72 (04/06 0758) Intake/Output from previous day: No intake/output data recorded. Intake/Output from this shift: No intake/output data recorded.  Labs: Recent Labs    02/07/18 0622  WBC 13.7*  HGB 9.5*  PLT 639*  CREATININE 0.65   Estimated Creatinine Clearance: 64.9 mL/min (by C-G formula based on SCr of 0.65 mg/dL). No results for input(s): VANCOTROUGH, VANCOPEAK, VANCORANDOM, GENTTROUGH, GENTPEAK, GENTRANDOM, TOBRATROUGH, TOBRAPEAK, TOBRARND, AMIKACINPEAK, AMIKACINTROU, AMIKACIN in the last 72 hours.   Microbiology:   Medical History: Past Medical History:  Diagnosis Date  . Hypertension   . Kidney stones   . Pulmonary hypertension (HCC)     Assessment: CC/HPI: Cough, wheezing, respiratory distress.  PMH: COPD, multiple myeloma, recent PNA, HTN, pulmonary HTN  ID: Acute PNA recently on Levaquin/Flagyl for PNA. WBC 13.7 elevated. Chest CT 4/6 + for acute PNA.  Goal of Therapy:  Vancomycin trough level 15-20 mcg/ml  Plan:  Cefepime 1g IV q 8 hrs x 8d Vanco 1g IV x 1 in ED, then '500mg'$  IV q 12h ( for 8 days like Cefepime) Vanco trough after 3-5 doses at steady state  Felipa Laroche S. Alford Highland, PharmD, BCPS Clinical Staff Pharmacist Pager [phone]  Eilene Ghazi Stillinger 02/07/2018,8:36 AM

## 2018-02-07 NOTE — Progress Notes (Signed)
Patient asked to come off of BiPAP and be placed on 3LNC per home regimen.

## 2018-02-08 ENCOUNTER — Other Ambulatory Visit: Payer: Self-pay

## 2018-02-08 DIAGNOSIS — I272 Pulmonary hypertension, unspecified: Secondary | ICD-10-CM

## 2018-02-08 DIAGNOSIS — C9000 Multiple myeloma not having achieved remission: Secondary | ICD-10-CM

## 2018-02-08 DIAGNOSIS — R7989 Other specified abnormal findings of blood chemistry: Secondary | ICD-10-CM

## 2018-02-08 DIAGNOSIS — J441 Chronic obstructive pulmonary disease with (acute) exacerbation: Secondary | ICD-10-CM

## 2018-02-08 LAB — BLOOD GAS, ARTERIAL
Acid-Base Excess: 5.8 mmol/L — ABNORMAL HIGH (ref 0.0–2.0)
BICARBONATE: 29.9 mmol/L — AB (ref 20.0–28.0)
Drawn by: 245131
O2 Content: 1.5 L/min
O2 Saturation: 94.3 %
PO2 ART: 68 mmHg — AB (ref 83.0–108.0)
Patient temperature: 98.1
pCO2 arterial: 43.9 mmHg (ref 32.0–48.0)
pH, Arterial: 7.446 (ref 7.350–7.450)

## 2018-02-08 LAB — CBC
HEMATOCRIT: 27.5 % — AB (ref 39.0–52.0)
Hemoglobin: 8.7 g/dL — ABNORMAL LOW (ref 13.0–17.0)
MCH: 23.6 pg — ABNORMAL LOW (ref 26.0–34.0)
MCHC: 31.6 g/dL (ref 30.0–36.0)
MCV: 74.5 fL — AB (ref 78.0–100.0)
Platelets: 664 10*3/uL — ABNORMAL HIGH (ref 150–400)
RBC: 3.69 MIL/uL — ABNORMAL LOW (ref 4.22–5.81)
RDW: 16 % — AB (ref 11.5–15.5)
WBC: 13.2 10*3/uL — AB (ref 4.0–10.5)

## 2018-02-08 LAB — BASIC METABOLIC PANEL
Anion gap: 8 (ref 5–15)
BUN: 8 mg/dL (ref 6–20)
CHLORIDE: 100 mmol/L — AB (ref 101–111)
CO2: 26 mmol/L (ref 22–32)
Calcium: 7.1 mg/dL — ABNORMAL LOW (ref 8.9–10.3)
Creatinine, Ser: 0.59 mg/dL — ABNORMAL LOW (ref 0.61–1.24)
GFR calc Af Amer: >60 mL/min (ref >60)
GFR calc non Af Amer: >60 mL/min (ref >60)
Glucose, Bld: 77 mg/dL (ref 65–99)
POTASSIUM: 4 mmol/L (ref 3.5–5.1)
Sodium: 134 mmol/L — ABNORMAL LOW (ref 135–145)

## 2018-02-08 LAB — STREP PNEUMONIAE URINARY ANTIGEN: STREP PNEUMO URINARY ANTIGEN: NEGATIVE

## 2018-02-08 LAB — PROCALCITONIN

## 2018-02-08 LAB — HIV ANTIBODY (ROUTINE TESTING W REFLEX): HIV Screen 4th Generation wRfx: NONREACTIVE

## 2018-02-08 MED ORDER — LORATADINE 10 MG PO TABS
10.0000 mg | ORAL_TABLET | Freq: Every day | ORAL | Status: DC
  Administered 2018-02-08 – 2018-02-14 (×7): 10 mg via ORAL
  Filled 2018-02-08 (×7): qty 1

## 2018-02-08 MED ORDER — METHYLPREDNISOLONE SODIUM SUCC 40 MG IJ SOLR
40.0000 mg | INTRAMUSCULAR | Status: DC
  Administered 2018-02-09 – 2018-02-14 (×6): 40 mg via INTRAVENOUS
  Filled 2018-02-08 (×6): qty 1

## 2018-02-08 MED ORDER — ONDANSETRON HCL 4 MG PO TABS
4.0000 mg | ORAL_TABLET | Freq: Four times a day (QID) | ORAL | Status: DC
  Administered 2018-02-08 – 2018-02-14 (×20): 4 mg via ORAL
  Filled 2018-02-08 (×20): qty 1

## 2018-02-08 MED ORDER — SIMETHICONE 80 MG PO CHEW
80.0000 mg | CHEWABLE_TABLET | Freq: Four times a day (QID) | ORAL | Status: DC
  Administered 2018-02-08 – 2018-02-14 (×23): 80 mg via ORAL
  Filled 2018-02-08 (×24): qty 1

## 2018-02-08 MED ORDER — BISACODYL 10 MG RE SUPP
10.0000 mg | Freq: Once | RECTAL | Status: AC
  Administered 2018-02-08: 10 mg via RECTAL
  Filled 2018-02-08: qty 1

## 2018-02-08 MED ORDER — FLUTICASONE PROPIONATE 50 MCG/ACT NA SUSP
2.0000 | Freq: Every day | NASAL | Status: DC
  Administered 2018-02-08 – 2018-02-14 (×7): 2 via NASAL
  Filled 2018-02-08: qty 16

## 2018-02-08 MED ORDER — LENALIDOMIDE 25 MG PO CAPS: 25.0000 mg | ORAL_CAPSULE | Freq: Every day | ORAL | Status: DC

## 2018-02-08 MED ORDER — FAMOTIDINE 20 MG PO TABS
20.0000 mg | ORAL_TABLET | Freq: Two times a day (BID) | ORAL | Status: DC
  Administered 2018-02-08 – 2018-02-14 (×13): 20 mg via ORAL
  Filled 2018-02-08 (×13): qty 1

## 2018-02-08 MED ORDER — ORAL CARE MOUTH RINSE
15.0000 mL | Freq: Two times a day (BID) | OROMUCOSAL | Status: DC
  Administered 2018-02-10 – 2018-02-12 (×3): 15 mL via OROMUCOSAL

## 2018-02-08 MED ORDER — DICLOFENAC SODIUM 1 % TD GEL
2.0000 g | Freq: Four times a day (QID) | TRANSDERMAL | Status: DC
Start: 2018-02-08 — End: 2018-02-14
  Administered 2018-02-08 – 2018-02-14 (×21): 2 g via TOPICAL
  Filled 2018-02-08: qty 100

## 2018-02-08 MED ORDER — METHYLPREDNISOLONE SODIUM SUCC 40 MG IJ SOLR
40.0000 mg | Freq: Two times a day (BID) | INTRAMUSCULAR | Status: DC
  Administered 2018-02-08: 40 mg via INTRAVENOUS
  Filled 2018-02-08: qty 1

## 2018-02-08 MED ORDER — LENALIDOMIDE 25 MG PO CAPS
25.0000 mg | ORAL_CAPSULE | Freq: Every day | ORAL | Status: DC
  Administered 2018-02-08 – 2018-02-14 (×7): 25 mg via ORAL
  Filled 2018-02-08 (×7): qty 1

## 2018-02-08 MED ORDER — BISACODYL 10 MG RE SUPP: 10.0000 mg | Freq: Every day | RECTAL | Status: DC

## 2018-02-08 NOTE — Progress Notes (Signed)
Called report to [facility] on 6E.

## 2018-02-08 NOTE — Progress Notes (Signed)
Pt on Revlimid 25 mg at home, according to pharmacy supposed to take for 14 days, started on 02-03-18, pt stated does not have anyone to bring medicine from home, Thanks Arvella Nigh RN.

## 2018-02-08 NOTE — Progress Notes (Signed)
PROGRESS NOTE    [name] [name]   TLX:[id number]  DOB: 1961/08/10  DOA: 02/07/2018 PCP: Patient, No Pcp Per   Brief Narrative:  [name] [name] with multiple Myeloma on Revlimid (Dr Lebron Conners), severe pulm HTN, Grade 2dCHF, COPD gold 4 (never smoked), bronchiectasis, chronic resp failure with hypoxia, left empyema s/p VATS in 2016.  He was admitted from 3/25-3/28 with dyspnea and presyncope and treated for R PNA and Colitis and d/c'd on 5 more days of Levaquin and 10 more days of Flagyl.  Returns for respiratory distress. He called EMS, was hypoxic in 80s and was started on CPAP, given solumedrol 125, Mag and 61m Albulterol. Transitioned to BiPAP in ED.     Lactic acid 2.35  CT chest: Diffuse patchy infiltrate throughout the right upper lobe consistent with acute pneumonia.Chronic calcified pleural plaques bilaterally right greater than left.Chronic but stable air-fluid collection in the left pleural space inferiorly with associated consolidation. The overall appearance is stable from the prior exam. Changes of chronic pulmonary arterial enlargement consistent with hypertension.  Subjective: Has pain in left lower abdomen and feels bloated when he eats and drinks. No diarrhea. Breathing better today. Has a cough.    Assessment & Plan:   Principal Problem:   Acute on chronic respiratory failure with hypoxia/ COPD exacerbation  Recent HCAP  COPD GOLD IV criteria but never smoked / bronchiectasis - Influenza neg, Resp virus panel neg - last pneumonia was right sided   - Influenza neg, Resp virus panel neg - Pro calcitonin neg, suggesting that he does not have a bacterial infection- stop antibiotics  - seems to be a COPD exacerbation- add Solumedrol (given by EMS yesterday but not resumed)  Active Problems:   High serum lactate - LA 2.35 has normalized  Left lower abdominal pain - tender in LLQ and lower mid abdomen- hurts when he moves and when he lifts his legs - no nausea, vomiting or  diarrhea but does have bloating - abdomen tympanic - this pain may be musculoskeletal- K pain, Voltaren gel ordered - Pepcid BID and Mylicon QID ordered for gas - Dulcolax suppository for constipation  Hypotension Grade II diastolic dysfunction, severe pulm HTN -received 750 NS boluses in ED and then on 75 cc/hr- eating and drinking normally-  d/c IVF - follow in SDU    Pleural effusion, left - chronic     Multiple myeloma  - being treated with Revlemid- Dr PLebron Connersadded to treatment team   DVT prophylaxis: Lovenox Code Status:full code Family Communication:  Disposition Plan: follow in SDU for hypotension Consultants:   none Procedures:   none Antimicrobials:  Anti-infectives (From admission, onward)   Start     Dose/Rate Route Frequency Ordered Stop   02/07/18 2200  vancomycin (VANCOCIN) 500 mg in sodium chloride 0.9 % 100 mL IVPB     500 mg 100 mL/hr over 60 Minutes Intravenous Every 12 hours 02/07/18 0840 02/15/18 2159   02/07/18 1400  ceFEPIme (MAXIPIME) 1 g in sodium chloride 0.9 % 100 mL IVPB     1 g 200 mL/hr over 30 Minutes Intravenous Every 8 hours 02/07/18 0819 02/15/18 1359   02/07/18 0800  vancomycin (VANCOCIN) IVPB 1000 mg/200 mL premix     1,000 mg 200 mL/hr over 60 Minutes Intravenous  Once 02/07/18 0707 02/07/18 0922   02/07/18 0730  ceFEPIme (MAXIPIME) 1 g in sodium chloride 0.9 % 100 mL IVPB     1 g 200 mL/hr over 30 Minutes Intravenous  Once 02/07/18  7416 02/07/18 0922       Objective: Vitals:   02/08/18 0100 02/08/18 0205 02/08/18 0702 02/08/18 0746  BP: (!) 92/50  (!) 86/40 (!) 98/42  Pulse: 86  77   Resp: 18  18   Temp: 98.1 F (36.7 C)  98 F (36.7 C)   TempSrc: Oral  Oral   SpO2: 96%  94%   Weight:  41.7 kg (92 lb)    Height:        Intake/Output Summary (Last 24 hours) at 02/08/2018 0749 Last data filed at 02/08/2018 0700 Gross per 24 hour  Intake 2566.25 ml  Output 600 ml  Net 1966.25 ml   Filed Weights   02/07/18 1316  02/08/18 0205  Weight: 40.7 kg (89 lb 12.8 oz) 41.7 kg (92 lb)    Examination: General exam: Appears comfortable  HEENT: PERRLA, oral mucosa moist, no sclera icterus or thrush Respiratory system: Clear to auscultation. Respiratory effort normal. Cardiovascular system: S1 & S2 heard, RRR.   Gastrointestinal system: Abdomen soft, tender in LLQ and mid lower abdomen, nondistended. Normal bowel sound. No organomegaly Central nervous system: Alert and oriented. No focal neurological deficits. Extremities: No cyanosis, clubbing or edema Skin: No rashes or ulcers Psychiatry:  Mood & affect appropriate.     Data Reviewed: I have personally reviewed following labs and imaging studies  CBC: Recent Labs  Lab 02/07/18 0622 02/07/18 0946 02/08/18 0518  WBC 13.7* 10.4 13.2*  NEUTROABS 10.4*  --   --   HGB 9.5* 10.2* 8.7*  HCT 30.7* 32.9* 27.5*  MCV 76.6* 76.5* 74.5*  PLT 639* 598* 384*   Basic Metabolic Panel: Recent Labs  Lab 02/07/18 0622 02/07/18 0946 02/08/18 0518  NA 135  --  134*  K 4.3  --  4.0  CL 95*  --  100*  CO2 30  --  26  GLUCOSE 88  --  77  BUN <5*  --  8  CREATININE 0.65 0.65 0.59*  CALCIUM 8.5*  --  7.1*   GFR: Estimated Creatinine Clearance: 61.5 mL/min (A) (by C-G formula based on SCr of 0.59 mg/dL (L)). Liver Function Tests: No results for input(s): AST, ALT, ALKPHOS, BILITOT, PROT, ALBUMIN in the last 168 hours. No results for input(s): LIPASE, AMYLASE in the last 168 hours. No results for input(s): AMMONIA in the last 168 hours. Coagulation Profile: No results for input(s): INR, PROTIME in the last 168 hours. Cardiac Enzymes: No results for input(s): CKTOTAL, CKMB, CKMBINDEX, TROPONINI in the last 168 hours. BNP (last 3 results) No results for input(s): PROBNP in the last 8760 hours. HbA1C: No results for input(s): HGBA1C in the last 72 hours. CBG: No results for input(s): GLUCAP in the last 168 hours. Lipid Profile: No results for input(s):  CHOL, HDL, LDLCALC, TRIG, CHOLHDL, LDLDIRECT in the last 72 hours. Thyroid Function Tests: No results for input(s): TSH, T4TOTAL, FREET4, T3FREE, THYROIDAB in the last 72 hours. Anemia Panel: No results for input(s): VITAMINB12, FOLATE, FERRITIN, TIBC, IRON, RETICCTPCT in the last 72 hours. Urine analysis:    Component Value Date/Time   COLORURINE YELLOW 12/22/2017 [place] 12/22/2017 1323   LABSPEC 1.011 12/22/2017 1323   PHURINE 8.0 12/22/2017 1323   GLUCOSEU NEGATIVE 12/22/2017 1323   HGBUR NEGATIVE 12/22/2017 [place] 12/22/2017 1323   KETONESUR NEGATIVE 12/22/2017 1323   PROTEINUR NEGATIVE 12/22/2017 1323   UROBILINOGEN 0.2 11/11/2012 0145   NITRITE NEGATIVE 12/22/2017 1323   LEUKOCYTESUR NEGATIVE 12/22/2017  1323   Sepsis Labs: '@LABRCNTIP' (procalcitonin:4,lacticidven:4) ) Recent Results (from the past 240 hour(s))  Respiratory Panel by PCR     Status: None   Collection Time: 02/07/18  9:46 AM  Result Value Ref Range Status   Adenovirus NOT DETECTED NOT DETECTED Final   Coronavirus 229E NOT DETECTED NOT DETECTED Final   Coronavirus HKU1 NOT DETECTED NOT DETECTED Final   Coronavirus NL63 NOT DETECTED NOT DETECTED Final   Coronavirus OC43 NOT DETECTED NOT DETECTED Final   Metapneumovirus NOT DETECTED NOT DETECTED Final   Rhinovirus / Enterovirus NOT DETECTED NOT DETECTED Final   Influenza A NOT DETECTED NOT DETECTED Final   Influenza B NOT DETECTED NOT DETECTED Final   Parainfluenza Virus 1 NOT DETECTED NOT DETECTED Final   Parainfluenza Virus 2 NOT DETECTED NOT DETECTED Final   Parainfluenza Virus 3 NOT DETECTED NOT DETECTED Final   Parainfluenza Virus 4 NOT DETECTED NOT DETECTED Final   Respiratory Syncytial Virus NOT DETECTED NOT DETECTED Final   Bordetella pertussis NOT DETECTED NOT DETECTED Final   Chlamydophila pneumoniae NOT DETECTED NOT DETECTED Final   Mycoplasma pneumoniae NOT DETECTED NOT DETECTED Final    Comment: Performed  at [facility], [place] [address] [place], [place] [address]         Radiology Studies: Ct Chest Wo Contrast  Result Date: 02/07/2018 CLINICAL DATA:  Shortness of breath and wheezing EXAM: CT CHEST WITHOUT CONTRAST TECHNIQUE: Multidetector CT imaging of the chest was performed following the standard protocol without IV contrast. COMPARISON:  Plain film from earlier in the same day., CT from 01/12/2018 FINDINGS: Cardiovascular: Somewhat limited due to the lack of IV contrast. Aortic atherosclerotic changes are noted. No aneurysmal dilatation is seen. The pulmonary artery is enlarged with the main pulmonary trunk measuring approximately 4.8 cm. Mild coronary calcifications are noted. Mediastinum/Nodes: The esophagus is within normal limits. No significant hilar or mediastinal adenopathy is noted. The thoracic inlet is within normal limits. Lungs/Pleura: Emphysematous changes are seen. A chronic air-fluid collection in the left pleural space is again identified and stable in appearance. Some chronic associated consolidation in the left lower lobe is noted as well. Some mild scarring is noted in the residual aerated left lung. Patchy infiltrative changes are noted in the right upper lobe significantly increased from the prior CT but similar to that seen on recent plain film examination. Some chronic scarring in the right lower lobe is noted. Diffuse calcified pleural plaques are noted on the right stable from the prior exam. A few calcified plaques are noted on the left but to a significantly lesser degree than that on the right. No sizable effusion is noted. Upper Abdomen: Within normal limits. Musculoskeletal: No acute bony abnormality is noted. IMPRESSION: Diffuse patchy infiltrate throughout the right upper lobe consistent with acute pneumonia. Chronic calcified pleural plaques bilaterally right greater than left. Chronic but stable air-fluid collection in the left pleural space inferiorly with  associated consolidation. The overall appearance is stable from the prior exam. Changes of chronic pulmonary arterial enlargement consistent with hypertension. Aortic Atherosclerosis (ICD10-I70.0) and Emphysema (ICD10-J43.9). Electronically Signed   By: Inez Catalina M.D.   On: 02/07/2018 08:08   Dg Chest Port 1 View  Result Date: 02/07/2018 CLINICAL DATA:  57 y/o M; acute onset respiratory distress. History of multiple myeloma. EXAM: PORTABLE CHEST 1 VIEW COMPARISON:  01/26/2018 chest radiograph.  01/12/2018 chest CT. FINDINGS: Stable cardiomegaly given projection and technique. Stable enlargement of pulmonary arteries compatible pulmonary artery hypertension. Stable left  large pleural collection and pleural thickening. Patchy opacities in the right upper lobe are mildly increased. No acute osseous abnormality is evident. IMPRESSION: Increased patchy opacities in the right upper lobe may represent residual/recurrent pneumonia. Stable left-sided pleural collection and thickening. Stable cardiac silhouette. Electronically Signed   By: Kristine Garbe M.D.   On: 02/07/2018 06:43      Scheduled Meds: . aspirin  325 mg Oral Daily  . enoxaparin (LOVENOX) injection  30 mg Subcutaneous Q24H  . methylPREDNISolone (SOLU-MEDROL) injection  40 mg Intravenous Q12H  . sodium chloride flush  3 mL Intravenous Q12H  . umeclidinium-vilanterol  1 puff Inhalation Daily   Continuous Infusions: . sodium chloride    . 0.9 % NaCl with KCl 20 mEq / L 75 mL/hr at 02/07/18 0945  . ceFEPime (MAXIPIME) IV 1 g (02/08/18 3500)  . vancomycin Stopped (02/08/18 0105)     LOS: 1 day    Time spent in minutes: 35    Debbe Odea, MD [facility] Pager: [email or web address] Password [facility] 02/08/2018, 7:49 AM

## 2018-02-09 ENCOUNTER — Other Ambulatory Visit: Payer: Self-pay

## 2018-02-09 ENCOUNTER — Ambulatory Visit: Payer: Self-pay | Admitting: Hematology and Oncology

## 2018-02-09 ENCOUNTER — Ambulatory Visit: Payer: Self-pay

## 2018-02-09 DIAGNOSIS — J9690 Respiratory failure, unspecified, unspecified whether with hypoxia or hypercapnia: Secondary | ICD-10-CM

## 2018-02-09 DIAGNOSIS — I519 Heart disease, unspecified: Secondary | ICD-10-CM

## 2018-02-09 LAB — CBC
HEMATOCRIT: 29.2 % — AB (ref 39.0–52.0)
HEMOGLOBIN: 9.1 g/dL — AB (ref 13.0–17.0)
MCH: 23.7 pg — AB (ref 26.0–34.0)
MCHC: 31.2 g/dL (ref 30.0–36.0)
MCV: 76 fL — AB (ref 78.0–100.0)
Platelets: 635 10*3/uL — ABNORMAL HIGH (ref 150–400)
RBC: 3.84 MIL/uL — AB (ref 4.22–5.81)
RDW: 16.2 % — ABNORMAL HIGH (ref 11.5–15.5)
WBC: 10.5 10*3/uL (ref 4.0–10.5)

## 2018-02-09 LAB — BASIC METABOLIC PANEL
ANION GAP: 7 (ref 5–15)
BUN: 5 mg/dL — ABNORMAL LOW (ref 6–20)
CHLORIDE: 100 mmol/L — AB (ref 101–111)
CO2: 29 mmol/L (ref 22–32)
Calcium: 7.6 mg/dL — ABNORMAL LOW (ref 8.9–10.3)
Creatinine, Ser: 0.71 mg/dL (ref 0.61–1.24)
GFR calc non Af Amer: >60 mL/min (ref >60)
Glucose, Bld: 87 mg/dL (ref 65–99)
POTASSIUM: 4.1 mmol/L (ref 3.5–5.1)
Sodium: 136 mmol/L (ref 135–145)

## 2018-02-09 LAB — LEGIONELLA PNEUMOPHILA SEROGP 1 UR AG: L. pneumophila Serogp 1 Ur Ag: NEGATIVE

## 2018-02-09 MED ORDER — TADALAFIL (PAH) 20 MG PO TABS
40.0000 mg | ORAL_TABLET | Freq: Every day | ORAL | Status: DC
  Administered 2018-02-09 – 2018-02-14 (×6): 40 mg via ORAL
  Filled 2018-02-09 (×7): qty 2

## 2018-02-09 NOTE — Consult Note (Signed)
IP PROGRESS NOTE Requesting provider: Dr Debbe Odea  Subjective:  [name] [name] is a 57 y.o. male with reasonably recent diagnosis of multiple myeloma undergoing palliative systemic therapy with bortezomib and dexamethasone.  Patient has achieved partial response following 4 cycles of therapy and was planned to initiate the addition of lenalidomide, but has developed complications including colitis, pneumonia, and ended up admitted to the hospital.  Was found to have severe pulmonary hypertension in addition to the infectious problems.  His previous history is significant for recurrent empyema with previous history of pleurodesis on the left side.  Patient is known to have persistent loculated effusion on that side, it has not been drained or cultured recently.  Patient was recently readmitted to the hospital after presenting with progressive and severe dyspnea.  Developed decompensated respiratory failure requiring noninvasive ventilatory support.  Presently, is gradually improving.  At this time, patient reports no fevers or chills.  He reports persistent discomfort in the epigastrium and left upper quadrant but also some chest tightness without chest pain or shortness of breath at rest.  Objective: Vital signs in last 24 hours: Blood pressure 112/61, pulse 76, temperature 98 F (36.7 C), temperature source Oral, resp. rate 17, height 5' (1.524 m), weight 96 lb 9.6 oz (43.8 kg), SpO2 93 %.  Intake/Output from previous day: 04/07 0701 - 04/08 0700 In: 1360.5 [P.O.:480; I.V.:780.5; IV Piggyback:100] Out: 650 [Urine:650]  Physical Exam:  Patient is alert, awake, oriented x3.  No apparent distress HEENT: Anicteric.  Moist mucous membranes Lungs: Bilateral diminished breath sounds in the dependent portions of the lungs with diffuse crackles but no expiratory wheezing Cardiac: S1/S2, regular, no murmurs, rubs, gallops. Abdomen: Soft, moderately tender in the epigastrium and left upper  quadrant. Extremities: No significant peripheral edema   Lab Results: Recent Labs    02/08/18 0518 02/09/18 0445  WBC 13.2* 10.5  HGB 8.7* 9.1*  HCT 27.5* 29.2*  PLT 664* 635*    BMET Recent Labs    02/08/18 0518 02/09/18 0445  NA 134* 136  K 4.0 4.1  CL 100* 100*  CO2 26 29  GLUCOSE 77 87  BUN 8 5*  CREATININE 0.59* 0.71  CALCIUM 7.1* 7.6*    No results found for: CEA1  Studies/Results: No results found.  Medications: I have reviewed the patient's current medications.  Assessment/Plan: 57 y.o.  male with history of recurrent infections including empyema recently diagnosed with multiple myeloma undergoing systemic therapy with bortezomib and dexamethasone  an previously planned for initiation of lenalidomide, but initiation was delayed due to infectious complications of the previous treatment regimen.  Patient had partial response to therapy so far.  Recurrent infection including colitis and pneumonia complicated by severe pulmonary hypertension likely due to previous recurrent pulmonary infections leading to respiratory decompensation.  Currently improving.  Recommendations: -No active cancer treatment at this time including no dexamethasone for myeloma therapy.  Patient will need to recover substantially and remain infection free for at least 2 weeks prior to resumption of therapy in my clinic. -Remainder of treatment for his current acute problems will be deferred to his current primary service.   LOS: 2 days   Ardath Sax, MD   02/09/2018, 8:18 PM

## 2018-02-09 NOTE — Progress Notes (Signed)
SATURATION QUALIFICATIONS: (This note is used to comply with regulatory documentation for home oxygen)  Patient Saturations on 2L at Rest = 95%  Patient Saturations on 3L while Ambulating = 97%  Patient Saturations on 3L Liters of oxygen while Ambulating = 97%  Please briefly explain why patient needs home oxygen:

## 2018-02-09 NOTE — Progress Notes (Signed)
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  He was admitted from 3/25-3/28 with dyspnea and presyncope and treated for R PNA and Colitis and d/c'd on 5 more days of Levaquin and 10 more days of Flagyl.  Returns for respiratory distress. He called EMS, was hypoxic in 80s and was started on CPAP, given solumedrol 125, Mag and '5mg'$  Albulterol. Transitioned to BiPAP in ED.     Lactic acid 2.35  CT chest: Diffuse patchy infiltrate throughout the right upper lobe consistent with acute pneumonia.Chronic calcified pleural plaques bilaterally right greater than left.Chronic but stable air-fluid collection in the left pleural space inferiorly with associated consolidation. The overall appearance is stable from the prior exam. Changes of chronic pulmonary arterial enlargement consistent with hypertension.  Subjective: Has pain in left lower abdomen is better today. Now points to left upper abdomen and states he has new pain in this area. Cough is mild. No dyspnea. Eating well.    Assessment & Plan:   Principal Problem:   Acute on chronic respiratory failure with hypoxia/ COPD exacerbation  Recent HCAP  COPD GOLD IV criteria but never smoked / bronchiectasis - Influenza neg, Resp virus panel neg - recent pneumonia was right sided   - Pro calcitonin neg, suggesting that right sided infiltrates are not a bacterial infection-  stopped antibiotics  - seems to be a COPD exacerbation- add Solumedrol (given by EMS yesterday but not resumed) - 4/8- no resp distress/ states he is on 3 L O2 at home but has no portable O2 tank- last left the home about 1 mo ago without O2 as he could not take the large tank. Have discussed with case  manager and ordered a portable tank-  Active Problems:   High serum lactate - LA 2.35 has normalized  Left lower abdominal pain - tender in LLQ and lower mid abdomen- hurts when he moves and when he lifts his legs - no nausea, vomiting or diarrhea but does have bloating - abdomen tympanic - this pain may be musculoskeletal- K pain, Voltaren gel ordered - Pepcid BID and Mylicon QID ordered for gas   - 4/8- L lower abd pain resolved and now Left upper abd pain- cont to treat as musculoskeletal pain  Hypotension Grade II diastolic dysfunction, severe pulm HTN -received 750 NS boluses in ED and then on 75 cc/hr- eating and drinking normally-  d/c IVF - follow in SDU- SBP > 100 now    Pleural effusion, left - chronic     Multiple myeloma  - being treated with Revlemid which is being continued- Dr Lebron Conners added to treatment team   DVT prophylaxis: Lovenox Code Status:full code Family Communication:  Disposition Plan: follow in SDU for hypotension- if stable tomorrow will d/c home Consultants:   none Procedures:   none Antimicrobials:  Anti-infectives (From admission, onward)   Start     Dose/Rate Route Frequency Ordered Stop   02/07/18 2200  vancomycin (VANCOCIN) 500 mg in sodium chloride 0.9 % 100 mL IVPB  Status:  Discontinued     500 mg 100 mL/hr over 60 Minutes Intravenous Every 12 hours 02/07/18 0840 02/08/18 1656   02/07/18 1400  ceFEPIme (MAXIPIME) 1 g in sodium chloride  0.9 % 100 mL IVPB  Status:  Discontinued     1 g 200 mL/hr over 30 Minutes Intravenous Every 8 hours 02/07/18 0819 02/08/18 1656   02/07/18 0800  vancomycin (VANCOCIN) IVPB 1000 mg/200 mL premix     1,000 mg 200 mL/hr over 60 Minutes Intravenous  Once 02/07/18 0707 02/07/18 0922   02/07/18 0730  ceFEPIme (MAXIPIME) 1 g in sodium chloride 0.9 % 100 mL IVPB     1 g 200 mL/hr over 30 Minutes Intravenous  Once 02/07/18 0705 02/07/18 0922       Objective: Vitals:   02/09/18 0819 02/09/18 1036  02/09/18 1037 02/09/18 1256  BP: 110/61   112/61  Pulse: 81   76  Resp: (!) 21   17  Temp: 97.9 F (36.6 C)   98 F (36.7 C)  TempSrc: Oral   Oral  SpO2:  (!) 89% 93%   Weight:      Height:        Intake/Output Summary (Last 24 hours) at 02/09/2018 1330 Last data filed at 02/09/2018 1200 Gross per 24 hour  Intake 693.75 ml  Output 1100 ml  Net -406.25 ml   Filed Weights   02/07/18 1316 02/08/18 0205 02/09/18 0422  Weight: 40.7 kg (89 lb 12.8 oz) 41.7 kg (92 lb) 43.8 kg (96 lb 9.6 oz)    Examination: General exam: Appears comfortable  HEENT: PERRLA, oral mucosa moist, no sclera icterus or thrush Respiratory system: mild Right sided crackles - decreased breath sounds in LLL. Respiratory effort normal. Cardiovascular system: S1 & S2 heard, RRR.   Gastrointestinal system: Abdomen soft, tender in LUQ, nondistended. Normal bowel sound. No organomegaly Central nervous system: Alert and oriented. No focal neurological deficits. Extremities: No cyanosis, clubbing or edema Skin: No rashes or ulcers Psychiatry:  Mood & affect appropriate.     Data Reviewed: I have personally reviewed following labs and imaging studies  CBC: Recent Labs  Lab 02/07/18 0622 02/07/18 0946 02/08/18 0518 02/09/18 0445  WBC 13.7* 10.4 13.2* 10.5  NEUTROABS 10.4*  --   --   --   HGB 9.5* 10.2* 8.7* 9.1*  HCT 30.7* 32.9* 27.5* 29.2*  MCV 76.6* 76.5* 74.5* 76.0*  PLT 639* 598* 664* 324*   Basic Metabolic Panel: Recent Labs  Lab 02/07/18 0622 02/07/18 0946 02/08/18 0518 02/09/18 0445  NA 135  --  134* 136  K 4.3  --  4.0 4.1  CL 95*  --  100* 100*  CO2 30  --  26 29  GLUCOSE 88  --  77 87  BUN <5*  --  8 5*  CREATININE 0.65 0.65 0.59* 0.71  CALCIUM 8.5*  --  7.1* 7.6*   GFR: Estimated Creatinine Clearance: 64.6 mL/min (by C-G formula based on SCr of 0.71 mg/dL). Liver Function Tests: No results for input(s): AST, ALT, ALKPHOS, BILITOT, PROT, ALBUMIN in the last 168 hours. No results  for input(s): LIPASE, AMYLASE in the last 168 hours. No results for input(s): AMMONIA in the last 168 hours. Coagulation Profile: No results for input(s): INR, PROTIME in the last 168 hours. Cardiac Enzymes: No results for input(s): CKTOTAL, CKMB, CKMBINDEX, TROPONINI in the last 168 hours. BNP (last 3 results) No results for input(s): PROBNP in the last 8760 hours. HbA1C: No results for input(s): HGBA1C in the last 72 hours. CBG: No results for input(s): GLUCAP in the last 168 hours. Lipid Profile: No results for input(s): CHOL, HDL, LDLCALC, TRIG, CHOLHDL, LDLDIRECT in the last  72 hours. Thyroid Function Tests: No results for input(s): TSH, T4TOTAL, FREET4, T3FREE, THYROIDAB in the last 72 hours. Anemia Panel: No results for input(s): VITAMINB12, FOLATE, FERRITIN, TIBC, IRON, RETICCTPCT in the last 72 hours. Urine analysis:    Component Value Date/Time   COLORURINE YELLOW 12/22/2017 [place] 12/22/2017 1323   LABSPEC 1.011 12/22/2017 1323   PHURINE 8.0 12/22/2017 1323   GLUCOSEU NEGATIVE 12/22/2017 1323   HGBUR NEGATIVE 12/22/2017 1323   BILIRUBINUR NEGATIVE 12/22/2017 1323   KETONESUR NEGATIVE 12/22/2017 1323   PROTEINUR NEGATIVE 12/22/2017 1323   UROBILINOGEN 0.2 11/11/2012 0145   NITRITE NEGATIVE 12/22/2017 1323   LEUKOCYTESUR NEGATIVE 12/22/2017 1323   Sepsis Labs: '@LABRCNTIP'$ (procalcitonin:4,lacticidven:4) ) Recent Results (from the past 240 hour(s))  Blood culture (routine x 2)     Status: None (Preliminary result)   Collection Time: 02/07/18  7:28 AM  Result Value Ref Range Status   Specimen Description BLOOD RIGHT FOREARM  Final   Special Requests   Final    BOTTLES DRAWN AEROBIC AND ANAEROBIC Blood Culture adequate volume   Culture   Final    NO GROWTH 1 DAY Performed at [facility], [place] [address], [place], [place] [address]    Report Status PENDING  Incomplete  Blood culture (routine x 2)     Status: None (Preliminary result)    Collection Time: 02/07/18  7:30 AM  Result Value Ref Range Status   Specimen Description BLOOD LEFT ANTECUBITAL  Final   Special Requests   Final    BOTTLES DRAWN AEROBIC AND ANAEROBIC Blood Culture adequate volume   Culture   Final    NO GROWTH 1 DAY Performed at [facility], [place] [address] [place], [place] [address]    Report Status PENDING  Incomplete  Respiratory Panel by PCR     Status: None   Collection Time: 02/07/18  9:46 AM  Result Value Ref Range Status   Adenovirus NOT DETECTED NOT DETECTED Final   Coronavirus 229E NOT DETECTED NOT DETECTED Final   Coronavirus HKU1 NOT DETECTED NOT DETECTED Final   Coronavirus NL63 NOT DETECTED NOT DETECTED Final   Coronavirus OC43 NOT DETECTED NOT DETECTED Final   Metapneumovirus NOT DETECTED NOT DETECTED Final   Rhinovirus / Enterovirus NOT DETECTED NOT DETECTED Final   Influenza A NOT DETECTED NOT DETECTED Final   Influenza B NOT DETECTED NOT DETECTED Final   Parainfluenza Virus 1 NOT DETECTED NOT DETECTED Final   Parainfluenza Virus 2 NOT DETECTED NOT DETECTED Final   Parainfluenza Virus 3 NOT DETECTED NOT DETECTED Final   Parainfluenza Virus 4 NOT DETECTED NOT DETECTED Final   Respiratory Syncytial Virus NOT DETECTED NOT DETECTED Final   Bordetella pertussis NOT DETECTED NOT DETECTED Final   Chlamydophila pneumoniae NOT DETECTED NOT DETECTED Final   Mycoplasma pneumoniae NOT DETECTED NOT DETECTED Final    Comment: Performed at [facility], [place] [address] [place], [place] [address]         Radiology Studies: No results found.    Scheduled Meds: . aspirin  325 mg Oral Daily  . diclofenac sodium  2 g Topical QID  . enoxaparin (LOVENOX) injection  30 mg Subcutaneous Q24H  . famotidine  20 mg Oral BID  . fluticasone  2 spray Each Nare Daily  . lenalidomide  25 mg Oral Daily  . loratadine  10 mg Oral Daily  . mouth rinse  15 mL Mouth Rinse BID  . methylPREDNISolone (SOLU-MEDROL)  injection  40 mg Intravenous  Q24H  . ondansetron  4 mg Oral Q6H  . simethicone  80 mg Oral QID  . sodium chloride flush  3 mL Intravenous Q12H  . umeclidinium-vilanterol  1 puff Inhalation Daily   Continuous Infusions: . sodium chloride       LOS: 2 days    Time spent in minutes: 35    Debbe Odea, MD [facility] Pager: [email or web address] Password [email or web address] 02/09/2018, 1:30 PM

## 2018-02-10 DIAGNOSIS — J9621 Acute and chronic respiratory failure with hypoxia: Secondary | ICD-10-CM

## 2018-02-10 NOTE — Progress Notes (Signed)
Notified by CCMD pt had short burst of Non-sustained ST/SVT. HR up to 140. Pt sleeping-easily aroused. Pt without complaints-stated he was having a dream. Strip saved in Epic for review-will continue to monitor. Jessie Foot, RN

## 2018-02-10 NOTE — Progress Notes (Addendum)
PROGRESS NOTE    [name] [name]   DTO:[id number]  DOB: 1961/05/20  DOA: 02/07/2018 PCP: Patient, No Pcp Per   Brief Narrative:  [name] with multiple Myeloma on Revlimid (Dr Lebron Conners), severe pulm HTN, Grade 2dCHF, COPD gold 4 (never smoked), bronchiectasis, chronic resp failure with hypoxia, left empyema s/p VATS in 2016 with remaining loculated effusion now.  He was admitted from 3/25-3/28 with dyspnea and presyncope and treated for R PNA and Colitis and d/c'd on 5 more days of Levaquin and 10 more days of Flagyl.  Returns for respiratory distress. He called EMS, was hypoxic in 80s and was started on CPAP, given solumedrol 125, Mag and '5mg'$  Albulterol. Transitioned to BiPAP in ED.     Lactic acid 2.35  CT chest: Diffuse patchy infiltrate throughout the right upper lobe consistent with acute pneumonia.Chronic calcified pleural plaques bilaterally right greater than left.Chronic but stable air-fluid collection in the left pleural space inferiorly with associated consolidation. The overall appearance is stable from the prior exam. Changes of chronic pulmonary arterial enlargement consistent with hypertension.  Subjective: Still has pain in left upper abdomen. Does not know where the Voltaren gel went. No nausea or vomiting. Has soft BMs.  Asking about the portable O2 tank.   Assessment & Plan:   Principal Problem:   Acute on chronic respiratory failure with hypoxia/ COPD exacerbation  Recent HCAP  COPD GOLD IV criteria but never smoked / bronchiectasis Chronic L pleural effusion (VATS in the past) - Influenza neg, Resp virus panel neg - recent pneumonia was right sided   - Pro calcitonin neg, suggesting that right sided infiltrates are not a bacterial infection-  stopped antibiotics  - seems to be a COPD exacerbation- added Solumedrol (given by EMS but not resumed) - 4/8- no resp distress/ states he is on 3 L O2 at home but has no portable O2 tank- last left the home about 1 mo ago  without O2 as he could not take the large tank. Have discussed with case manager and ordered a DME portable tank- - 4/9 - I have asked for pulm consult and have spoken with Dr Chase Caller- he is currently stable on 3 L O2 (pulse ox assessed with walk yesterday) without antibiotics and on solumedrol  Active Problems:   High serum lactate - LA 2.35 has normalized  Left lower abdominal pain - tender in LLQ and lower mid abdomen- hurts when he moves and when he lifts his legs - no nausea, vomiting or diarrhea but does have bloating - abdomen tympanic - this pain may be musculoskeletal- K pain, Voltaren gel ordered - Pepcid BID and Mylicon QID ordered for gas   - 4/8- L lower abd pain resolved and now Left upper abd pain-clearly is musculoskeletal on exam- he states he is eating well- RNs need to continue Volatren and k pad  Hypotension Grade II diastolic dysfunction, severe pulm HTN - see above plan for pulmonary today -received 750 NS boluses in ED and then on 75 cc/hr- eating and drinking normally-  d/c'd IVF yesterday  - it looks like SBP 80-90 is his baseline-   Short episode of SVT on 4/9 about 3:30 AM - he was asleep - this is not surprising considering his respiratory issues and grade 2 dCHF - follow for further spells - no need to treat yet as it was once, short lived and caused no symptoms   Multiple myeloma  - being treated with Revlemid which is being continued- Dr Lebron Conners and  I have spoken 2 x today- he agrees that this is muscular abd pain and current issues are pulmonary- he is not sure he wants to give him chemo next week but would like to continue Revlemid and steroids.   DVT prophylaxis: Lovenox Code Status:full code Family Communication:  Disposition Plan: follow in SDU for hypotension- if stable tomorrow will d/c home Consultants:   none Procedures:   none Antimicrobials:  Anti-infectives (From admission, onward)   Start     Dose/Rate Route Frequency Ordered Stop    02/28/18 2200  vancomycin (VANCOCIN) 500 mg in sodium chloride 0.9 % 100 mL IVPB  Status:  Discontinued     500 mg 100 mL/hr over 60 Minutes Intravenous Every 12 hours 02-28-18 0840 02/08/18 1656   2018/02/28 1400  ceFEPIme (MAXIPIME) 1 g in sodium chloride 0.9 % 100 mL IVPB  Status:  Discontinued     1 g 200 mL/hr over 30 Minutes Intravenous Every 8 hours 02/28/18 0819 02/08/18 1656   February 28, 2018 0800  vancomycin (VANCOCIN) IVPB 1000 mg/200 mL premix     1,000 mg 200 mL/hr over 60 Minutes Intravenous  Once 2018-02-28 0707 28-Feb-2018 0922   02/28/2018 0730  ceFEPIme (MAXIPIME) 1 g in sodium chloride 0.9 % 100 mL IVPB     1 g 200 mL/hr over 30 Minutes Intravenous  Once 02/28/18 0705 28-Feb-2018 0922       Objective: Vitals:   02/10/18 0426 02/10/18 0427 02/10/18 0820 02/10/18 1206  BP: (!) 93/56  97/71 99/64  Pulse: 78  68 70  Resp: 20     Temp: 97.7 F (36.5 C)  98.4 F (36.9 C) 98.6 F (37 C)  TempSrc: Oral  Oral Oral  SpO2: 100%  98% 97%  Weight:  44 kg (97 lb)    Height:        Intake/Output Summary (Last 24 hours) at 02/10/2018 1322 Last data filed at 02/10/2018 1017 Gross per 24 hour  Intake 118 ml  Output 350 ml  Net -232 ml   Filed Weights   02/08/18 0205 02/09/18 0422 02/10/18 0427  Weight: 41.7 kg (92 lb) 43.8 kg (96 lb 9.6 oz) 44 kg (97 lb)    Examination: General exam: Appears comfortable  HEENT: PERRLA, oral mucosa moist, no sclera icterus or thrush Respiratory system: b/l basilar crackles- Respiratory effort normal. Cardiovascular system: S1 & S2 heard, RRR.   Gastrointestinal system: Abdomen soft, tender in LUQ and epigastrium, nondistended. Normal bowel sound. No organomegaly Central nervous system: Alert and oriented. No focal neurological deficits. Extremities: No cyanosis, clubbing or edema Skin: No rashes or ulcers Psychiatry:  Mood & affect appropriate.     Data Reviewed: I have personally reviewed following labs and imaging studies  CBC: Recent Labs    Lab 02-28-2018 0622 02-28-18 0946 02/08/18 0518 02/09/18 0445  WBC 13.7* 10.4 13.2* 10.5  NEUTROABS 10.4*  --   --   --   HGB 9.5* 10.2* 8.7* 9.1*  HCT 30.7* 32.9* 27.5* 29.2*  MCV 76.6* 76.5* 74.5* 76.0*  PLT 639* 598* 664* 474*   Basic Metabolic Panel: Recent Labs  Lab 28-Feb-2018 0622 2018/02/28 0946 02/08/18 0518 02/09/18 0445  NA 135  --  134* 136  K 4.3  --  4.0 4.1  CL 95*  --  100* 100*  CO2 30  --  26 29  GLUCOSE 88  --  77 87  BUN <5*  --  8 5*  CREATININE 0.65 0.65 0.59* 0.71  CALCIUM 8.5*  --  7.1* 7.6*   GFR: Estimated Creatinine Clearance: 64.9 mL/min (by C-G formula based on SCr of 0.71 mg/dL). Liver Function Tests: No results for input(s): AST, ALT, ALKPHOS, BILITOT, PROT, ALBUMIN in the last 168 hours. No results for input(s): LIPASE, AMYLASE in the last 168 hours. No results for input(s): AMMONIA in the last 168 hours. Coagulation Profile: No results for input(s): INR, PROTIME in the last 168 hours. Cardiac Enzymes: No results for input(s): CKTOTAL, CKMB, CKMBINDEX, TROPONINI in the last 168 hours. BNP (last 3 results) No results for input(s): PROBNP in the last 8760 hours. HbA1C: No results for input(s): HGBA1C in the last 72 hours. CBG: No results for input(s): GLUCAP in the last 168 hours. Lipid Profile: No results for input(s): CHOL, HDL, LDLCALC, TRIG, CHOLHDL, LDLDIRECT in the last 72 hours. Thyroid Function Tests: No results for input(s): TSH, T4TOTAL, FREET4, T3FREE, THYROIDAB in the last 72 hours. Anemia Panel: No results for input(s): VITAMINB12, FOLATE, FERRITIN, TIBC, IRON, RETICCTPCT in the last 72 hours. Urine analysis:    Component Value Date/Time   COLORURINE YELLOW 12/22/2017 [place] 12/22/2017 1323   LABSPEC 1.011 12/22/2017 1323   PHURINE 8.0 12/22/2017 1323   GLUCOSEU NEGATIVE 12/22/2017 1323   HGBUR NEGATIVE 12/22/2017 1323   BILIRUBINUR NEGATIVE 12/22/2017 1323   KETONESUR NEGATIVE 12/22/2017 1323    PROTEINUR NEGATIVE 12/22/2017 1323   UROBILINOGEN 0.2 11/11/2012 0145   NITRITE NEGATIVE 12/22/2017 1323   LEUKOCYTESUR NEGATIVE 12/22/2017 1323   Sepsis Labs: '@LABRCNTIP'$ (procalcitonin:4,lacticidven:4) ) Recent Results (from the past 240 hour(s))  Blood culture (routine x 2)     Status: None (Preliminary result)   Collection Time: 02/07/18  7:28 AM  Result Value Ref Range Status   Specimen Description BLOOD RIGHT FOREARM  Final   Special Requests   Final    BOTTLES DRAWN AEROBIC AND ANAEROBIC Blood Culture adequate volume   Culture   Final    NO GROWTH 3 DAYS Performed at [facility], [place] [address], [place], [place] [address]    Report Status PENDING  Incomplete  Blood culture (routine x 2)     Status: None (Preliminary result)   Collection Time: 02/07/18  7:30 AM  Result Value Ref Range Status   Specimen Description BLOOD LEFT ANTECUBITAL  Final   Special Requests   Final    BOTTLES DRAWN AEROBIC AND ANAEROBIC Blood Culture adequate volume   Culture   Final    NO GROWTH 3 DAYS Performed at [facility], [place] [address] [place],  [address]    Report Status PENDING  Incomplete  Respiratory Panel by PCR     Status: None   Collection Time: 02/07/18  9:46 AM  Result Value Ref Range Status   Adenovirus NOT DETECTED NOT DETECTED Final   Coronavirus 229E NOT DETECTED NOT DETECTED Final   Coronavirus HKU1 NOT DETECTED NOT DETECTED Final   Coronavirus NL63 NOT DETECTED NOT DETECTED Final   Coronavirus OC43 NOT DETECTED NOT DETECTED Final   Metapneumovirus NOT DETECTED NOT DETECTED Final   Rhinovirus / Enterovirus NOT DETECTED NOT DETECTED Final   Influenza A NOT DETECTED NOT DETECTED Final   Influenza B NOT DETECTED NOT DETECTED Final   Parainfluenza Virus 1 NOT DETECTED NOT DETECTED Final   Parainfluenza Virus 2 NOT DETECTED NOT DETECTED Final   Parainfluenza Virus 3 NOT DETECTED NOT DETECTED Final   Parainfluenza Virus 4 NOT DETECTED NOT DETECTED Final    Respiratory Syncytial Virus NOT DETECTED  NOT DETECTED Final   Bordetella pertussis NOT DETECTED NOT DETECTED Final   Chlamydophila pneumoniae NOT DETECTED NOT DETECTED Final   Mycoplasma pneumoniae NOT DETECTED NOT DETECTED Final    Comment: Performed at [facility], [place] [address] [place], [place] [address]         Radiology Studies: No results found.    Scheduled Meds: . aspirin  325 mg Oral Daily  . diclofenac sodium  2 g Topical QID  . enoxaparin (LOVENOX) injection  30 mg Subcutaneous Q24H  . famotidine  20 mg Oral BID  . fluticasone  2 spray Each Nare Daily  . lenalidomide  25 mg Oral Daily  . loratadine  10 mg Oral Daily  . mouth rinse  15 mL Mouth Rinse BID  . methylPREDNISolone (SOLU-MEDROL) injection  40 mg Intravenous Q24H  . ondansetron  4 mg Oral Q6H  . simethicone  80 mg Oral QID  . sodium chloride flush  3 mL Intravenous Q12H  . tadalafil (PAH)  40 mg Oral Daily  . umeclidinium-vilanterol  1 puff Inhalation Daily   Continuous Infusions: . sodium chloride       LOS: 3 days    Time spent in minutes: 35    Debbe Odea, MD [facility] Pager: [email or web address] Password [email or web address] 02/10/2018, 1:22 PM

## 2018-02-10 NOTE — Progress Notes (Signed)
Spoke with Dr. Chase Caller concerning TB blood screening. Stated pt does not need to be on airborne. Testing due to pt being immunocompromised. Cont to monitor. Carroll Kinds RN

## 2018-02-10 NOTE — Consult Note (Signed)
IP PROGRESS NOTE Requesting provider: Dr Debbe Odea  Subjective:  No new complaints overnight.  Reports feeling better overall with less chest tightness, shortness of breath, or abdominal pain while receiving supplemental oxygen.  Objective: Vital signs in last 24 hours: Blood pressure 99/64, pulse 70, temperature 98.6 F (37 C), temperature source Oral, resp. rate 20, height 5' (1.524 m), weight 97 lb (44 kg), SpO2 97 %.  Intake/Output from previous day: 04/08 0701 - 04/09 0700 In: -  Out: 950 [Urine:950]  Physical Exam:  Patient is alert, awake, oriented x3.  No apparent distress HEENT: Anicteric.  Moist mucous membranes Lungs: Poor air entry bilaterally.  Absent breath sounds with dullness to percussion over approximately one third of the lower left lung.  Right lung is better aerated, but diffuse crackles are noted. Cardiac: S1/S2, regular, no murmurs, rubs, gallops. Abdomen: Soft, moderately tender in the epigastrium and left upper quadrant. Extremities: No significant peripheral edema   Lab Results: Recent Labs    02/08/18 0518 02/09/18 0445  WBC 13.2* 10.5  HGB 8.7* 9.1*  HCT 27.5* 29.2*  PLT 664* 635*    BMET Recent Labs    02/08/18 0518 02/09/18 0445  NA 134* 136  K 4.0 4.1  CL 100* 100*  CO2 26 29  GLUCOSE 77 87  BUN 8 5*  CREATININE 0.59* 0.71  CALCIUM 7.1* 7.6*    No results found for: CEA1  Studies/Results: No results found.  Medications: I have reviewed the patient's current medications.  Assessment/Plan: 57 y.o.  male with history of recurrent infections including empyema recently diagnosed with multiple myeloma undergoing systemic therapy with bortezomib and dexamethasone  an previously planned for initiation of lenalidomide, but initiation was delayed due to infectious complications of the previous treatment regimen.  Patient had partial response to therapy so far.  Currently admitted due to respiratory failure.  Initial lung imaging is  suggestive of possible infection, but due to negative calcitonin, significant infectious process is less likely.  More likely, respiratory decompensation is due to combination of COPD and pulmonary hypertension.  Recommendations: -No active cancer treatment at this time including no dexamethasone for myeloma therapy.  -Recommend pulmonology and/or cardiology consultation to assist in management of the lung problems and pulmonary hypertension. -Return to my clinic in 1 week (we will arrange my end)   LOS: 3 days   Ardath Sax, MD   02/10/2018, 2:00 PM

## 2018-02-10 NOTE — Progress Notes (Addendum)
I have spoken with Dr Lebron Conners and we would like a pulmonary eval to help determine why he was re-adamitted and what can be done to help him. I have spoken with Dr Chase Caller. I will follow up. Full not pending. Debbe Odea, MD

## 2018-02-10 NOTE — Consult Note (Signed)
Name: [name] [name] MRN: [id number] DOB: [birth year]    ADMISSION DATE:  02/07/2018 CONSULTATION DATE: 02/10/2018  REFERRING MD : [facility]  CHIEF COMPLAINT: Dyspnea  BRIEF PATIENT DESCRIPTION: Middle-aged male who is in no acute distress  SIGNIFICANT EVENTS    STUDIES:     HISTORY OF PRESENT ILLNESS:  57 year old male who is a native of [place] and moved to [place] in 1985.  Prior to 2003 had no medical history that was pertinent.  Since then he is developed empyema and required a video-assisted thorascopic intervention in 2003 per Dr. Servando Snare.  He is also been recently diagnosed with multiple myeloma for which she is followed by oncology and started on lenalidomide, bortezomib, low-dose dexamethasone.  He has a known history of pulmonary artery hypertension.  He was recently admitted 01/26/2018 per oncology service and found to have pneumonia.  He required noninvasive mechanical ventilatory support and was treated with antibiotics and oxygen support.  He was discharged from [facility] on 01/29/2018 to follow-up with his primary care physician.  He represents to [facility] on 02/07/2018 after EMS was activated due to shortness of breath.  He was admitted and treated with steroids oxygen and noninvasive mechanical ventilatory support.  He is improved and is now on nasal cannula.  02/10/2018 pulmonary critical care is asked to evaluate.  Note he is feeling better and looks better with current therapeutic interventions.  His CT scan chest x-ray do not look remarkably different from previous scans.  Currently is on steroids nasal cannula without antimicrobial coverage.  Respiratory virus panel for 04/2018 is negative, blood cultures are pending as of 02/20/2018.  PAST MEDICAL HISTORY :   has a past medical history of Acute on chronic respiratory failure ([place]) (02/07/2018), Hypertension, Kidney stones, and Pulmonary hypertension ([place]).  has a past surgical history that includes Kidney surgery;  Video assisted thoracoscopy (vats)/decortication (Left, 2003); Video assisted thoracoscopy (vats)/decortication (Left, 10/02/2015); Video bronchoscopy (N/A, 10/02/2015); Cardiac catheterization (N/A, 01/18/2016); and Cardiac catheterization (N/A, 06/24/2016). Prior to Admission medications   Medication Sig Start Date End Date Taking? Authorizing Provider  albuterol (PROVENTIL HFA;VENTOLIN HFA) 108 (90 Base) MCG/ACT inhaler Inhale 2 puffs into the lungs every 6 (six) hours as needed for wheezing or shortness of breath. 07/28/17  Yes Tanda Rockers, MD  aspirin EC 325 MG EC tablet Take 1 tablet (325 mg total) by mouth daily. 11/09/16  Yes Rai, Ripudeep K, MD  DM-Doxylamine-Acetaminophen (NYQUIL HBP COLD & FLU) 15-6.25-325 MG/15ML LIQD Take 15 mLs by mouth at bedtime as needed (sleep).   Yes [provider]  lenalidomide (REVLIMID) 25 MG capsule Take 1 capsule (25 mg total) by mouth daily for 14 days. 02/03/18 02/17/18 Yes Perlov, Marinell Blight, MD  levofloxacin (LEVAQUIN) 500 MG tablet Take 1 tablet (500 mg total) by mouth daily. 01/29/18  Yes Charlynne Cousins, MD  ondansetron (ZOFRAN) 8 MG tablet Take 1 tablet (8 mg total) by mouth 2 (two) times daily as needed (Nausea or vomiting). 11/08/17  Yes Brunetta Genera, MD  OXYGEN 2lpm BEDTIME ONLY   Yes [provider]  tadalafil, PAH, (ADCIRCA) 20 MG tablet Take 2 tablets (40 mg total) by mouth daily. Patient taking differently: Take 20 mg by mouth daily.  08/26/17  Yes McQuaid, Ronie Spies, MD  umeclidinium-vilanterol (ANORO ELLIPTA) 62.5-25 MCG/INH AEPB Inhale 1 puff into the lungs daily. 08/26/17  Yes Juanito Doom, MD  dexamethasone (DECADRON) 4 MG tablet Take 5 tablets (20 mg total) by mouth once a  week. 11/14/17   Brunetta Genera, MD  traMADol (ULTRAM) 50 MG tablet Take 50 mg by mouth every 6 (six) hours as needed for moderate pain.    [provider]   No Known Allergies  FAMILY HISTORY:  family history includes  Hypertension in his other. SOCIAL HISTORY:  reports that he has never smoked. He has never used smokeless tobacco. He reports that he drinks alcohol. He reports that he does not use drugs.  REVIEW OF SYSTEMS:   10 point review of system taken, please see HPI for positives and negatives.   SUBJECTIVE:  Middle-aged male no acute distress at rest VITAL SIGNS: Temp:  [97.7 F (36.5 C)-98.6 F (37 C)] 98.6 F (37 C) (04/09 1206) Pulse Rate:  [68-78] 70 (04/09 1206) Resp:  [16-22] 20 (04/09 0426) BP: (87-112)/(53-71) 99/64 (04/09 1206) SpO2:  [97 %-100 %] 97 % (04/09 1206) Weight:  [44 kg (97 lb)] 44 kg (97 lb) (04/09 0427)  PHYSICAL EXAMINATION: General: Frail middle-aged male in no acute distress.  Poor English. Neuro: Intact follows commands HEENT: No JVD lymphadenopathy is appreciated Cardiovascular: Heart sounds are regular regular rate and rhythm Lungs: Coarse rhonchi bilaterally decreased breath sounds left base Abdomen: Complains of pain in the right upper quadrant Musculoskeletal: Intact Skin: Dry  Recent Labs  Lab 02/07/18 0622 02/07/18 0946 02/08/18 0518 02/09/18 0445  NA 135  --  134* 136  K 4.3  --  4.0 4.1  CL 95*  --  100* 100*  CO2 30  --  26 29  BUN <5*  --  8 5*  CREATININE 0.65 0.65 0.59* 0.71  GLUCOSE 88  --  77 87   Recent Labs  Lab 02/07/18 0946 02/08/18 0518 02/09/18 0445  HGB 10.2* 8.7* 9.1*  HCT 32.9* 27.5* 29.2*  WBC 10.4 13.2* 10.5  PLT 598* 664* 635*   No results found.  ASSESSMENT:    Acute on chronic respiratory failure with hypoxia (HCC)   Pleural effusion, left   Pulmonary HTN (HCC)   COPD GOLD IV criteria but never smoked    Chronic respiratory failure with hypoxia (HCC)   Facility-acquired pneumonia   Multiple myeloma (HCC)cycle 4 lenalidomide, bortezomib, low-dose dexamethasone   High serum lactate   Grade II diastolic dysfunction   Acute on chronic respiratory failure [facility])  Discussion:  57 year old male who is a  native of [place] and moved to [place] in 1985.  Prior to 2003 had no medical history that was pertinent.  Since then he is developed empyema and required a video-assisted thorascopic intervention in 2003 per Dr. Servando Snare.  He is also been recently diagnosed with multiple myeloma for which she is followed by oncology and started on lenalidomide, bortezomib, low-dose dexamethasone.  Note he complains of pain right upper quadrant increases with coughing and with palpation.  He has a known history of pulmonary artery hypertension.  He was recently admitted 01/26/2018 per oncology service and found to have pneumonia.  He required noninvasive mechanical ventilatory support and was treated with antibiotics and oxygen support.  He was discharged from [facility] on 01/29/2018 to follow-up with his primary care physician.  He represents to [facility] on 02/07/2018 after EMS was activated due to shortness of breath.  He was admitted and treated with steroids oxygen and noninvasive mechanical ventilatory support.  He is improved and is now on nasal cannula.  02/10/2018 pulmonary critical care is asked to evaluate.  Note he is feeling better and looks better  with current therapeutic interventions.  His CT scan chest x-ray do not look remarkably different from previous scans.  Currently is on steroids nasal cannula without antimicrobial coverage.  Respiratory virus panel for 04/2018 is negative, blood cultures are pending as of 02/20/2018.     PLAN:  Acute on chronic respiratory failure in the setting of multiple myeloma with history of empyema with VATS per Dr. Servando Snare in the past.  Continue steroids for now  Monitor culture data  Question if he needs fiberoptic bronchoscopy versus CV TS consult  Treatment for multiple myeloma per hematology oncology   All other issues per primary care  [facility] Tyesha Joffe ACNP Maryanna Shape PCCM Pager [phone] till 1 pm If no answer page [phone] 02/10/2018, 12:28  PM

## 2018-02-11 DIAGNOSIS — R918 Other nonspecific abnormal finding of lung field: Secondary | ICD-10-CM

## 2018-02-11 LAB — PROCALCITONIN: Procalcitonin: <0.1 ng/mL

## 2018-02-11 LAB — SEDIMENTATION RATE: Sed Rate: 49 mm/hr — ABNORMAL HIGH (ref 0–16)

## 2018-02-11 NOTE — Progress Notes (Signed)
Name: [name] [name] MRN: [id number] DOB: Feb 10, 1961    ADMISSION DATE:  02/07/2018 CONSULTATION DATE: 02/10/2018  REFERRING MD : [facility]  CHIEF COMPLAINT: Dyspnea  SIGNIFICANT EVENTS  4/6 admit for resp distress  STUDIES:  CT chest 4/6 > Diffuse patchy infiltrate throughout the right upper lobe consistent with acute pneumonia. Chronic calcified pleural plaques bilaterally right greater than left. Chronic but stable air-fluid collection in the left pleural space inferiorly with associated consolidation. The overall appearance is stable from the prior exam. Changes of chronic pulmonary arterial enlargement consistent with hypertension.   HISTORY OF PRESENT ILLNESS:  57 year old male who is a native of [place] and moved to [place] in 1985.  Prior to 2003 had no medical history that was pertinent.  Since then he is developed empyema and required a video-assisted thorascopic intervention in 2003 per Dr. Servando Snare.  He is also been recently diagnosed with multiple myeloma for which she is followed by oncology and started on lenalidomide, bortezomib, low-dose dexamethasone.  He has a known history of pulmonary artery hypertension.  He was recently admitted 01/26/2018 per oncology service and found to have pneumonia.  He required noninvasive mechanical ventilatory support and was treated with antibiotics and oxygen support.  He was discharged from [facility] on 01/29/2018 to follow-up with his primary care physician.  He represents to [facility] on 02/07/2018 after EMS was activated due to shortness of breath.  He was admitted and treated with steroids oxygen and noninvasive mechanical ventilatory support.  He is improved and is now on nasal cannula.  02/10/2018 pulmonary critical care is asked to evaluate.  Note he is feeling better and looks better with current therapeutic interventions. Currently is on steroids nasal cannula without antimicrobial coverage.  Respiratory virus panel for 04/2018 is  negative, blood cultures are pending as of 02/20/2018.  SUBJECTIVE:  Middle-aged male no acute distress at rest  VITAL SIGNS: Temp:  [98.2 F (36.8 C)-98.6 F (37 C)] 98.5 F (36.9 C) (04/10 0624) Pulse Rate:  [70-83] 83 (04/10 0624) Resp:  [17-18] 17 (04/10 0624) BP: (94-112)/(61-66) 112/66 (04/10 0624) SpO2:  [94 %-98 %] 96 % (04/10 0830) Weight:  [41.8 kg (92 lb 3.2 oz)] 41.8 kg (92 lb 3.2 oz) (04/10 0622)  PHYSICAL EXAMINATION: General: Frail middle-aged male in no acute distress. Speaks some english.  Neuro: Alert, oriented, non-focal HEENT: /AT, PERRL, no appreciable JVD Cardiovascular: Heart sounds are regular regular rate and rhythm Lungs: Rales throughout posterior air fields.  Abdomen: Complains of diffuse abdominal pain.  Musculoskeletal: Intact Skin: Dry  Recent Labs  Lab 02/07/18 0622 02/07/18 0946 02/08/18 0518 02/09/18 0445  NA 135  --  134* 136  K 4.3  --  4.0 4.1  CL 95*  --  100* 100*  CO2 30  --  26 29  BUN <5*  --  8 5*  CREATININE 0.65 0.65 0.59* 0.71  GLUCOSE 88  --  77 87   Recent Labs  Lab 02/07/18 0946 02/08/18 0518 02/09/18 0445  HGB 10.2* 8.7* 9.1*  HCT 32.9* 27.5* 29.2*  WBC 10.4 13.2* 10.5  PLT 598* 664* 635*   No results found.  ASSESSMENT: Acute on chronic respiratory failure with hypoxia RUL infiltrate: Pnuemonia in immunocompromised host vs pulmonary drug tox from chemo agents.  COPD GOLD IV criteria, but reportedly never smoked. Doubt exacerbation Questionable CAP: Cannot necessarily trust nml PCT and nml WBC in immunocompromised - Continue supplemental Oxygen. On 3L here, which he tells me is his home  flow rate.  - Continue systemic steroids - Low threshold to restart antibiotics if worsens.  patient.  - Not a good candidate for sedation so will hold off on bronchocsopy for the time being.  - Pending labs: Legionella antigen, Percival Spanish,   Pulmonary HTN  Grade II diastolic dysfunction - Continue tadalafil  - Would  hold diuresis as he does not appear clinically volume up.  Multiple myeloma cycle 4 lenalidomide, bortezomib, low-dose dexamethasone -Treatment for multiple myeloma per hematology oncology  [name], AGACNP-BC [facility] Pulmonology/Critical Care Pager [phone] or [phone]  02/11/2018 9:53 AM

## 2018-02-11 NOTE — Progress Notes (Signed)
PROGRESS NOTE    [name] [name]   OHY:[id number]  DOB: Mar 27, 1961  DOA: 02/07/2018 PCP: Patient, No Pcp Per   Brief Narrative:  [name] with multiple Myeloma on Revlimid (Dr Lebron Conners), severe pulm HTN, Grade 2dCHF, COPD gold 4 (never smoked), bronchiectasis, chronic resp failure with hypoxia, left empyema s/p VATS in 2016 with remaining loculated effusion now.  He was admitted from 3/25-3/28 with dyspnea and presyncope and treated for R PNA and Colitis and d/c'd on 5 more days of Levaquin and 10 more days of Flagyl.  Returns for respiratory distress. He called EMS, was hypoxic in 80s and was started on CPAP, given solumedrol 125, Mag and '5mg'$  Albulterol. Transitioned to BiPAP in ED.     Lactic acid 2.35  CT chest: Diffuse patchy infiltrate throughout the right upper lobe consistent with acute pneumonia.Chronic calcified pleural plaques bilaterally right greater than left.Chronic but stable air-fluid collection in the left pleural space inferiorly with associated consolidation. The overall appearance is stable from the prior exam. Changes of chronic pulmonary arterial enlargement consistent with hypertension.  Subjective: No new problems reported.    Assessment & Plan:   Principal Problem:   Acute on chronic respiratory failure with hypoxia/ COPD exacerbation  Recent HCAP  COPD GOLD IV criteria but never smoked / bronchiectasis Chronic L pleural effusion (VATS in the past) - Influenza neg, Resp virus panel neg - recent pneumonia was right sided   - Pro calcitonin neg, suggesting that right sided infiltrates are not a bacterial infection-  stopped antibiotics  - Pulmonary on board and managing.   Active Problems:   High serum lactate - LA 2.35 has normalized  Left lower abdominal pain - tender in LLQ and lower mid abdomen- hurts when he moves and when he lifts his legs - no nausea, vomiting or diarrhea but does have bloating - abdomen tympanic - this pain may be musculoskeletal- K  pain, Voltaren gel ordered - Pepcid BID and Mylicon QID ordered for gas   - 4/8- L lower abd pain resolved and now Left upper abd pain-clearly is musculoskeletal on exam- he states he is eating well- RNs need to continue Volatren and k pad  Hypotension Grade II diastolic dysfunction, severe pulm HTN - hold diuretic as pt does not appear hypervolemic -received 750 NS boluses in ED and then on 75 cc/hr- eating and drinking normally-  d/c'd IVF yesterday  - it looks like SBP 80-90 is his baseline-   Short episode of SVT on 4/9 about 3:30 AM - he was asleep - this is not surprising considering his respiratory issues and grade 2 dCHF - follow for further spells - no need to treat yet as it was once, short lived and caused no symptoms   Multiple myeloma  - per oncologist: No active cancer treatment at this time including no dexamethasone for myeloma therapy.  Patient will need to recover substantially and remain infection free for at least 2 weeks prior to resumption of therapy in my clinic.   DVT prophylaxis: Lovenox Code Status:full code Family Communication:  Disposition Plan: once cleared for d/c by specialist Consultants:   Pulmonology  Oncology Procedures:   none Antimicrobials:  Anti-infectives (From admission, onward)   Start     Dose/Rate Route Frequency Ordered Stop   02/07/18 2200  vancomycin (VANCOCIN) 500 mg in sodium chloride 0.9 % 100 mL IVPB  Status:  Discontinued     500 mg 100 mL/hr over 60 Minutes Intravenous Every 12 hours 02/07/18 0840  02/08/18 1656   02/07/18 1400  ceFEPIme (MAXIPIME) 1 g in sodium chloride 0.9 % 100 mL IVPB  Status:  Discontinued     1 g 200 mL/hr over 30 Minutes Intravenous Every 8 hours 02/07/18 0819 02/08/18 1656   02/07/18 0800  vancomycin (VANCOCIN) IVPB 1000 mg/200 mL premix     1,000 mg 200 mL/hr over 60 Minutes Intravenous  Once 02/07/18 0707 02/07/18 0922   02/07/18 0730  ceFEPIme (MAXIPIME) 1 g in sodium chloride 0.9 % 100 mL  IVPB     1 g 200 mL/hr over 30 Minutes Intravenous  Once 02/07/18 0705 02/07/18 0922       Objective: Vitals:   02/11/18 0624 02/11/18 0830 02/11/18 1333 02/11/18 1537  BP: 112/66  103/67 (!) 92/54  Pulse: 83  81 81  Resp: _0 Temp: 98.5 F (36.9 C)  98.6 F (37 C) 98.2 F (36.8 C)  TempSrc: Oral  Oral Oral  SpO2: 94% 96% 100% 95%  Weight:      Height:        Intake/Output Summary (Last 24 hours) at 02/11/2018 1705 Last data filed at 02/11/2018 1700 Gross per 24 hour  Intake 240 ml  Output 1300 ml  Net -1060 ml   Filed Weights   02/09/18 0422 02/10/18 0427 02/11/18 0622  Weight: 43.8 kg (96 lb 9.6 oz) 44 kg (97 lb) 41.8 kg (92 lb 3.2 oz)    Examination: General exam: Appears comfortable  HEENT: PERRLA, oral mucosa moist, no sclera icterus or thrush Respiratory system: b/l basilar crackles- Respiratory effort normal. Cardiovascular system: S1 & S2 heard, RRR.   Gastrointestinal system: Abdomen soft, tender in LUQ and epigastrium, nondistended. Normal bowel sound. No organomegaly Central nervous system: Alert and oriented. No focal neurological deficits. Extremities: No cyanosis, clubbing or edema Skin: No rashes or ulcers Psychiatry:  Mood & affect appropriate.     Data Reviewed: I have personally reviewed following labs and imaging studies  CBC: Recent Labs  Lab 02/07/18 0622 02/07/18 0946 02/08/18 0518 02/09/18 0445  WBC 13.7* 10.4 13.2* 10.5  NEUTROABS 10.4*  --   --   --   HGB 9.5* 10.2* 8.7* 9.1*  HCT 30.7* 32.9* 27.5* 29.2*  MCV 76.6* 76.5* 74.5* 76.0*  PLT 639* 598* 664* 161*   Basic Metabolic Panel: Recent Labs  Lab 02/07/18 0622 02/07/18 0946 02/08/18 0518 02/09/18 0445  NA 135  --  134* 136  K 4.3  --  4.0 4.1  CL 95*  --  100* 100*  CO2 30  --  26 29  GLUCOSE 88  --  77 87  BUN <5*  --  8 5*  CREATININE 0.65 0.65 0.59* 0.71  CALCIUM 8.5*  --  7.1* 7.6*   GFR: Estimated Creatinine Clearance: 61.7 mL/min (by C-G formula  based on SCr of 0.71 mg/dL). Liver Function Tests: No results for input(s): AST, ALT, ALKPHOS, BILITOT, PROT, ALBUMIN in the last 168 hours. No results for input(s): LIPASE, AMYLASE in the last 168 hours. No results for input(s): AMMONIA in the last 168 hours. Coagulation Profile: No results for input(s): INR, PROTIME in the last 168 hours. Cardiac Enzymes: No results for input(s): CKTOTAL, CKMB, CKMBINDEX, TROPONINI in the last 168 hours. BNP (last 3 results) No results for input(s): PROBNP in the last 8760 hours. HbA1C: No results for input(s): HGBA1C in the last 72 hours. CBG: No results for input(s): GLUCAP in the last 168 hours. Lipid Profile: No results  for input(s): CHOL, HDL, LDLCALC, TRIG, CHOLHDL, LDLDIRECT in the last 72 hours. Thyroid Function Tests: No results for input(s): TSH, T4TOTAL, FREET4, T3FREE, THYROIDAB in the last 72 hours. Anemia Panel: No results for input(s): VITAMINB12, FOLATE, FERRITIN, TIBC, IRON, RETICCTPCT in the last 72 hours. Urine analysis:    Component Value Date/Time   COLORURINE YELLOW 12/22/2017 [place] 12/22/2017 1323   LABSPEC 1.011 12/22/2017 1323   PHURINE 8.0 12/22/2017 1323   GLUCOSEU NEGATIVE 12/22/2017 1323   HGBUR NEGATIVE 12/22/2017 1323   BILIRUBINUR NEGATIVE 12/22/2017 1323   KETONESUR NEGATIVE 12/22/2017 1323   PROTEINUR NEGATIVE 12/22/2017 1323   UROBILINOGEN 0.2 11/11/2012 0145   NITRITE NEGATIVE 12/22/2017 1323   LEUKOCYTESUR NEGATIVE 12/22/2017 1323   Sepsis Labs: _0 (procalcitonin:4,lacticidven:4) ) Recent Results (from the past 240 hour(s))  Blood culture (routine x 2)     Status: None (Preliminary result)   Collection Time: 02/07/18  7:28 AM  Result Value Ref Range Status   Specimen Description BLOOD RIGHT FOREARM  Final   Special Requests   Final    BOTTLES DRAWN AEROBIC AND ANAEROBIC Blood Culture adequate volume   Culture   Final    NO GROWTH 4 DAYS Performed at [place], [place] [address] [place], [place] [address]    Report Status PENDING  Incomplete  Blood culture (routine x 2)     Status: None (Preliminary result)   Collection Time: 02/07/18  7:30 AM  Result Value Ref Range Status   Specimen Description BLOOD LEFT ANTECUBITAL  Final   Special Requests   Final    BOTTLES DRAWN AEROBIC AND ANAEROBIC Blood Culture adequate volume   Culture   Final    NO GROWTH 4 DAYS Performed at [facility], [place] [address] [place], [place] [address]    Report Status PENDING  Incomplete  Respiratory Panel by PCR     Status: None   Collection Time: 02/07/18  9:46 AM  Result Value Ref Range Status   Adenovirus NOT DETECTED NOT DETECTED Final   Coronavirus 229E NOT DETECTED NOT DETECTED Final   Coronavirus HKU1 NOT DETECTED NOT DETECTED Final   Coronavirus NL63 NOT DETECTED NOT DETECTED Final   Coronavirus OC43 NOT DETECTED NOT DETECTED Final   Metapneumovirus NOT DETECTED NOT DETECTED Final   Rhinovirus / Enterovirus NOT DETECTED NOT DETECTED Final   Influenza A NOT DETECTED NOT DETECTED Final   Influenza B NOT DETECTED NOT DETECTED Final   Parainfluenza Virus 1 NOT DETECTED NOT DETECTED Final   Parainfluenza Virus 2 NOT DETECTED NOT DETECTED Final   Parainfluenza Virus 3 NOT DETECTED NOT DETECTED Final   Parainfluenza Virus 4 NOT DETECTED NOT DETECTED Final   Respiratory Syncytial Virus NOT DETECTED NOT DETECTED Final   Bordetella pertussis NOT DETECTED NOT DETECTED Final   Chlamydophila pneumoniae NOT DETECTED NOT DETECTED Final   Mycoplasma pneumoniae NOT DETECTED NOT DETECTED Final    Comment: Performed at [facility], [place] [address] [place], [place] [address]         Radiology Studies: No results found.    Scheduled Meds: . aspirin  325 mg Oral Daily  . diclofenac sodium  2 g Topical QID  . enoxaparin (LOVENOX) injection  30 mg Subcutaneous Q24H  . famotidine  20 mg Oral BID  . fluticasone  2 spray Each Nare Daily  . lenalidomide   25 mg Oral Daily  . loratadine  10 mg Oral Daily  .  mouth rinse  15 mL Mouth Rinse BID  . methylPREDNISolone (SOLU-MEDROL) injection  40 mg Intravenous Q24H  . ondansetron  4 mg Oral Q6H  . simethicone  80 mg Oral QID  . sodium chloride flush  3 mL Intravenous Q12H  . tadalafil (PAH)  40 mg Oral Daily  . umeclidinium-vilanterol  1 puff Inhalation Daily   Continuous Infusions: . sodium chloride       LOS: 4 days    Time spent in minutes: Ward, MD [facility] Pager: [email or web address] Password [email or web address] 02/11/2018, 5:05 PM

## 2018-02-11 NOTE — Care Management Note (Addendum)
Case Management Note  Patient Details  Name: [name] [name] MRN: [id number] Date of Birth: [birth year]  Subjective/Objective: Pt presented for respiratory distress. Pt continues on IV Steroids. PTA from home with support of Niece. Pt states that hs is having difficulty getting to appointments/ pharmacy. CM will make CSW aware to see if she can assist. Pt is without Insurance at this time. Financial Counselor following the patient. Pt is currently working with DSS for Medicaid.                   Action/Plan: CM did schedule patient a Hospital Follow up Appointment at the [facility]. Pt will be able to utilize the [facility] and medications will range between $4.00-$10.00. Pt does follow up at the [facility] Dr Lebron Conners. CM did call the [facility] and he gets his Velcade Injections from the [facility] free via patient assistance. Most of the other medications via the [employer] (Has a $400 grant via pharmacy). CM did speak with patient about DME 02. Pt states he has a concentrator via [facility], however no E tanks. CM did call [facility] and he will have Respiratory to speak with patient in regards to fill station for 02 at home. CM was able to speak with Patients Representative: Anderson Malta @ [phone] and she states she will be able to get him to appointments and to pharmacy. CM will continue to monitor.   Expected Discharge Date:                  Expected Discharge Plan:  [place]  In-House Referral:  Development worker, community, PCP / [facility]  Discharge planning Services  CM Consult, [facility], Medication Assistance  Post Acute Care Choice:    Choice offered to:     DME Arranged:  N/A DME Agency:     HH Arranged:    HH Agency:     Status of Service:  In process, will continue to follow  If discussed at Long Length of Stay Meetings, dates discussed:    Additional Comments:  Bethena Roys, RN 02/11/2018, 3:19 PM

## 2018-02-12 ENCOUNTER — Other Ambulatory Visit: Payer: Self-pay

## 2018-02-12 ENCOUNTER — Inpatient Hospital Stay (HOSPITAL_COMMUNITY): Payer: Self-pay

## 2018-02-12 ENCOUNTER — Ambulatory Visit: Payer: Self-pay

## 2018-02-12 DIAGNOSIS — J90 Pleural effusion, not elsewhere classified: Secondary | ICD-10-CM

## 2018-02-12 DIAGNOSIS — J449 Chronic obstructive pulmonary disease, unspecified: Secondary | ICD-10-CM

## 2018-02-12 DIAGNOSIS — J9611 Chronic respiratory failure with hypoxia: Secondary | ICD-10-CM

## 2018-02-12 DIAGNOSIS — J189 Pneumonia, unspecified organism: Secondary | ICD-10-CM

## 2018-02-12 LAB — CULTURE, BLOOD (ROUTINE X 2)
CULTURE: NO GROWTH
Culture: NO GROWTH
SPECIAL REQUESTS: ADEQUATE
SPECIAL REQUESTS: ADEQUATE

## 2018-02-12 LAB — LEGIONELLA PNEUMOPHILA SEROGP 1 UR AG: L. PNEUMOPHILA SEROGP 1 UR AG: NEGATIVE

## 2018-02-12 NOTE — Progress Notes (Signed)
Pt had a run of SVT overnight. Pt asymptomatic & asleep. Will continue to monitor the pt. Hoover Brunette, RN

## 2018-02-12 NOTE — Progress Notes (Addendum)
Name: [name] [name] MRN: [id number] DOB: 05/01/1961    ADMISSION DATE:  02/07/2018 CONSULTATION DATE: 02/10/2018  REFERRING MD : [facility]  CHIEF COMPLAINT: Dyspnea  SIGNIFICANT EVENTS  4/6 admit for resp distress  STUDIES:  CT chest 4/6 > Diffuse patchy infiltrate throughout the right upper lobe consistent with acute pneumonia. Chronic calcified pleural plaques bilaterally right greater than left. Chronic but stable air-fluid collection in the left pleural space inferiorly with associated consolidation. The overall appearance is stable from the prior exam. Changes of chronic pulmonary arterial enlargement consistent with hypertension.   HISTORY OF PRESENT ILLNESS:  57 year old male who is a native of [place] and moved to [place] in 1985.  Prior to 2003 had no medical history that was pertinent.  Since then he is developed empyema and required a video-assisted thorascopic intervention in 2003 per Dr. Servando Snare.  He is also been recently diagnosed with multiple myeloma for which she is followed by oncology and started on lenalidomide, bortezomib, low-dose dexamethasone.  He has a known history of pulmonary artery hypertension.  He was recently admitted 01/26/2018 per oncology service and found to have pneumonia.  He required noninvasive mechanical ventilatory support and was treated with antibiotics and oxygen support.  He was discharged from [facility] on 01/29/2018 to follow-up with his primary care physician.  He represents to [facility] on 02/07/2018 after EMS was activated due to shortness of breath.  He was admitted and treated with steroids oxygen and noninvasive mechanical ventilatory support.  He is improved and is now on nasal cannula.  02/10/2018 pulmonary critical care is asked to evaluate.  Note he is feeling better and looks better with current therapeutic interventions. Currently is on steroids nasal cannula without antimicrobial coverage.  Respiratory virus panel for 04/2018 is  negative, blood cultures are pending as of 02/20/2018.  SUBJECTIVE:  No complaints, breathing getting better day by day. Goal is to get off oxygen. Belly hurts from heparin shots.   VITAL SIGNS: Temp:  [98.1 F (36.7 C)-98.7 F (37.1 C)] 98.1 F (36.7 C) (04/11 0823) Pulse Rate:  [72-85] 72 (04/11 0823) Resp:  [16-18] 17 (04/11 0823) BP: (85-112)/(52-67) 112/62 (04/11 0823) SpO2:  [94 %-100 %] 94 % (04/11 1105) Weight:  [40.8 kg (90 lb)] 40.8 kg (90 lb) (04/11 0522)  PHYSICAL EXAMINATION: General: Frail middle-aged male in no acute distress. Speaks some english.  Neuro: alert, oriented, non-focal HEENT: [place]/AT, PERRL, no JVD Cardiovascular: Heart sounds are regular regular rate and rhythm Lungs: diminished bases. Rales bilateral apices.  Abdomen: Belly pain from heparin shots.  Musculoskeletal: Intact Skin: Dry  Recent Labs  Lab 02/07/18 0622 02/07/18 0946 02/08/18 0518 02/09/18 0445  NA 135  --  134* 136  K 4.3  --  4.0 4.1  CL 95*  --  100* 100*  CO2 30  --  26 29  BUN <5*  --  8 5*  CREATININE 0.65 0.65 0.59* 0.71  GLUCOSE 88  --  77 87   Recent Labs  Lab 02/07/18 0946 02/08/18 0518 02/09/18 0445  HGB 10.2* 8.7* 9.1*  HCT 32.9* 27.5* 29.2*  WBC 10.4 13.2* 10.5  PLT 598* 664* 635*   Dg Chest Port 1 View  Result Date: 02/12/2018 CLINICAL DATA:  Follow-up pulmonary infiltrates. EXAM: PORTABLE CHEST 1 VIEW COMPARISON:  Chest CT and chest radiograph, 02/07/2018 FINDINGS: Patchy, hazy airspace consolidation right lung at along the peripheral left mid lung is stable. More dense consolidation at the left lung base is also  unchanged. No new lung abnormalities. Lungs are hyperexpanded. No pneumothorax. IMPRESSION: No change from the most recent prior exams. Persist airspace lung opacities. Electronically Signed   By: Lajean Manes M.D.   On: 02/12/2018 09:02    ASSESSMENT: Acute on chronic respiratory failure with hypoxia RUL infiltrate: Pnuemonia in immunocompromised  host vs pulmonary drug tox from chemo agents.  COPD GOLD IV criteria, but reportedly never smoked. Doubt exacerbation Questionable CAP: Cannot necessarily trust nml PCT and nml WBC in immunocompromised - Continue supplemental Oxygen. On 3L here, which he tells me is his home flow rate. But home O2 is new and his goal is to get off. - Continue systemic steroids, seems to be having slow improvement.  - Not a good candidate for sedation so will hold off on bronchocsopy for the time being.  - Pending labs: Legionella antigen, Quant Gold,   Pulmonary HTN  Grade II diastolic dysfunction - Continue tadalafil  - Would hold diuresis as he does not appear clinically volume overloaded  Multiple myeloma cycle 4 lenalidomide, bortezomib, low-dose dexamethasone -Treatment for multiple myeloma per hematology oncology  [name], AGACNP-BC [facility] Pulmonology/Critical Care Pager [phone] or [phone]  02/12/2018 11:28 AM  Attending Note:  57 year old male with acute on chronic respiratory failure with hypoxemia.  RUL infiltrate and COPD with ?CAP.  On exam, decreased BS diffusely.  I reviewed chest CT myself, loculated effusion on the left with some air in it that is unchanged from 2 years ago and some infiltrate on the left.  Discussed with PCCM-NP.  CAP:  - Monitor WBC and fever curve  - Completed course of abx  COPD:  - Bronchodilators as ordered  - Avoid hyperoxygenation  Pulmonary HTN:  - Tadalafil  - Diureses as ordered  Pulmonary infiltrate:  - Continue course of steroids  - No bronch  - F/U with PCCM as outpatient in 2 weeks  PCCM will sign off, please call back if needed.  Patient seen and examined, agree with above note.  I dictated the care and orders written for this patient under my direction.  Rush Farmer, [place]

## 2018-02-12 NOTE — Progress Notes (Signed)
PROGRESS NOTE    [name] [name]   DEY:[id number]  DOB: 1961/07/14  DOA: 02/07/2018 PCP: Patient, No Pcp Per   Brief Narrative:  [name] with multiple Myeloma on Revlimid (Dr Lebron Conners), severe pulm HTN, Grade 2dCHF, COPD gold 4 (never smoked), bronchiectasis, chronic resp failure with hypoxia, left empyema s/p VATS in 2016 with remaining loculated effusion now.  He was admitted from 3/25-3/28 with dyspnea and presyncope and treated for R PNA and Colitis and d/c'd on 5 more days of Levaquin and 10 more days of Flagyl.  Returns for respiratory distress. He called EMS, was hypoxic in 80s and was started on CPAP, given solumedrol 125, Mag and 34m Albulterol. Transitioned to BiPAP in ED.     Lactic acid 2.35  CT chest: Diffuse patchy infiltrate throughout the right upper lobe consistent with acute pneumonia.Chronic calcified pleural plaques bilaterally right greater than left.Chronic but stable air-fluid collection in the left pleural space inferiorly with associated consolidation. The overall appearance is stable from the prior exam. Changes of chronic pulmonary arterial enlargement consistent with hypertension.  Subjective: No new problems reported.    Assessment & Plan:   Principal Problem:   Acute on chronic respiratory failure with hypoxia/ COPD exacerbation  Recent HCAP  COPD GOLD IV criteria but never smoked / bronchiectasis Chronic L pleural effusion (VATS in the past) - Influenza neg, Resp virus panel neg - recent pneumonia was right sided   - Pro calcitonin neg, suggesting that right sided infiltrates are not a bacterial infection-  Pt completed antibiotics  - Pulmonary on board and recommends continuing course of steroids.  Active Problems:   High serum lactate - LA 2.35 has normalized  Left lower abdominal pain - tender in LLQ and lower mid abdomen- hurts when he moves and when he lifts his legs - no nausea, vomiting or diarrhea but does have bloating - abdomen tympanic -  this pain may be musculoskeletal- K pain, Voltaren gel ordered - Pepcid BID and Mylicon QID ordered for gas   - 4/8- L lower abd pain resolved and now Left upper abd pain-clearly is musculoskeletal on exam- he states he is eating well- RNs need to continue Volatren and k pad  Hypotension Grade II diastolic dysfunction, severe pulm HTN - hold diuretic as pt does not appear hypervolemic -received 750 NS boluses in ED and then on 75 cc/hr- eating and drinking normally-  d/c'd IVF yesterday  - it looks like SBP 80-90 is his baseline-   Short episode of SVT on 4/9 about 3:30 AM - he was asleep - this is not surprising considering his respiratory issues and grade 2 dCHF - follow for further spells - no need to treat yet as it was once, short lived and caused no symptoms   Multiple myeloma  - per oncologist: No active cancer treatment at this time including no dexamethasone for myeloma therapy.  Patient will need to recover substantially and remain infection free for at least 2 weeks prior to resumption of therapy in my clinic.   DVT prophylaxis: Lovenox Code Status:full code Family Communication:  Disposition Plan: d/c next am on steroids with pulm f/u in 2 wks Consultants:   Pulmonology  Oncology Procedures:   none Antimicrobials:  Anti-infectives (From admission, onward)   Start     Dose/Rate Route Frequency Ordered Stop   02/07/18 2200  vancomycin (VANCOCIN) 500 mg in sodium chloride 0.9 % 100 mL IVPB  Status:  Discontinued     500 mg 100 mL/hr  over 60 Minutes Intravenous Every 12 hours 02/07/18 0840 02/08/18 1656   02/07/18 1400  ceFEPIme (MAXIPIME) 1 g in sodium chloride 0.9 % 100 mL IVPB  Status:  Discontinued     1 g 200 mL/hr over 30 Minutes Intravenous Every 8 hours 02/07/18 0819 02/08/18 1656   02/07/18 0800  vancomycin (VANCOCIN) IVPB 1000 mg/200 mL premix     1,000 mg 200 mL/hr over 60 Minutes Intravenous  Once 02/07/18 0707 02/07/18 0922   02/07/18 0730  ceFEPIme  (MAXIPIME) 1 g in sodium chloride 0.9 % 100 mL IVPB     1 g 200 mL/hr over 30 Minutes Intravenous  Once 02/07/18 0705 02/07/18 0922       Objective: Vitals:   02/12/18 0823 02/12/18 1105 02/12/18 1325 02/12/18 1650  BP: 112/62  115/60 92/79  Pulse: 72  70   Resp: 17  18   Temp: 98.1 F (36.7 C)  98 F (36.7 C) 98.5 F (36.9 C)  TempSrc: Oral  Oral Oral  SpO2:  94% 97% 99%  Weight:      Height:        Intake/Output Summary (Last 24 hours) at 02/12/2018 1703 Last data filed at 02/12/2018 1500 Gross per 24 hour  Intake 600 ml  Output 2325 ml  Net -1725 ml   Filed Weights   02/10/18 0427 02/11/18 0622 02/12/18 0522  Weight: 44 kg (97 lb) 41.8 kg (92 lb 3.2 oz) 40.8 kg (90 lb)    Examination: General exam: Appears comfortable , in nad. HEENT: PERRLA, oral mucosa moist, no sclera icterus or thrush Respiratory system: b/l basilar crackles- Respiratory effort normal. Cardiovascular system: S1 & S2 heard, RRR.   Gastrointestinal system: Abdomen soft, tender in LUQ and epigastrium, nondistended. Normal bowel sound. No organomegaly Central nervous system: Alert and oriented. No focal neurological deficits. Extremities: No cyanosis, clubbing or edema Skin: No rashes or ulcers Psychiatry:  Mood & affect appropriate.     Data Reviewed: I have personally reviewed following labs and imaging studies  CBC: Recent Labs  Lab 02/07/18 0622 02/07/18 0946 02/08/18 0518 02/09/18 0445  WBC 13.7* 10.4 13.2* 10.5  NEUTROABS 10.4*  --   --   --   HGB 9.5* 10.2* 8.7* 9.1*  HCT 30.7* 32.9* 27.5* 29.2*  MCV 76.6* 76.5* 74.5* 76.0*  PLT 639* 598* 664* 527*   Basic Metabolic Panel: Recent Labs  Lab 02/07/18 0622 02/07/18 0946 02/08/18 0518 02/09/18 0445  NA 135  --  134* 136  K 4.3  --  4.0 4.1  CL 95*  --  100* 100*  CO2 30  --  26 29  GLUCOSE 88  --  77 87  BUN <5*  --  8 5*  CREATININE 0.65 0.65 0.59* 0.71  CALCIUM 8.5*  --  7.1* 7.6*   GFR: Estimated Creatinine  Clearance: 60.2 mL/min (by C-G formula based on SCr of 0.71 mg/dL). Liver Function Tests: No results for input(s): AST, ALT, ALKPHOS, BILITOT, PROT, ALBUMIN in the last 168 hours. No results for input(s): LIPASE, AMYLASE in the last 168 hours. No results for input(s): AMMONIA in the last 168 hours. Coagulation Profile: No results for input(s): INR, PROTIME in the last 168 hours. Cardiac Enzymes: No results for input(s): CKTOTAL, CKMB, CKMBINDEX, TROPONINI in the last 168 hours. BNP (last 3 results) No results for input(s): PROBNP in the last 8760 hours. HbA1C: No results for input(s): HGBA1C in the last 72 hours. CBG: No results for input(s): GLUCAP in  the last 168 hours. Lipid Profile: No results for input(s): CHOL, HDL, LDLCALC, TRIG, CHOLHDL, LDLDIRECT in the last 72 hours. Thyroid Function Tests: No results for input(s): TSH, T4TOTAL, FREET4, T3FREE, THYROIDAB in the last 72 hours. Anemia Panel: No results for input(s): VITAMINB12, FOLATE, FERRITIN, TIBC, IRON, RETICCTPCT in the last 72 hours. Urine analysis:    Component Value Date/Time   COLORURINE YELLOW 12/22/2017 [place] 12/22/2017 1323   LABSPEC 1.011 12/22/2017 1323   PHURINE 8.0 12/22/2017 1323   GLUCOSEU NEGATIVE 12/22/2017 1323   HGBUR NEGATIVE 12/22/2017 1323   BILIRUBINUR NEGATIVE 12/22/2017 1323   KETONESUR NEGATIVE 12/22/2017 1323   PROTEINUR NEGATIVE 12/22/2017 1323   UROBILINOGEN 0.2 11/11/2012 0145   NITRITE NEGATIVE 12/22/2017 1323   LEUKOCYTESUR NEGATIVE 12/22/2017 1323   Sepsis Labs: _0 (procalcitonin:4,lacticidven:4) ) Recent Results (from the past 240 hour(s))  Blood culture (routine x 2)     Status: None   Collection Time: 02/07/18  7:28 AM  Result Value Ref Range Status   Specimen Description BLOOD RIGHT FOREARM  Final   Special Requests   Final    BOTTLES DRAWN AEROBIC AND ANAEROBIC Blood Culture adequate volume   Culture   Final    NO GROWTH 5 DAYS Performed at  [facility], [place] [address], [place], [place] [address]    Report Status 02/12/2018 FINAL  Final  Blood culture (routine x 2)     Status: None   Collection Time: 02/07/18  7:30 AM  Result Value Ref Range Status   Specimen Description BLOOD LEFT ANTECUBITAL  Final   Special Requests   Final    BOTTLES DRAWN AEROBIC AND ANAEROBIC Blood Culture adequate volume   Culture   Final    NO GROWTH 5 DAYS Performed at [facility], [place] [address], [place], [place] [address]    Report Status 02/12/2018 FINAL  Final  Respiratory Panel by PCR     Status: None   Collection Time: 02/07/18  9:46 AM  Result Value Ref Range Status   Adenovirus NOT DETECTED NOT DETECTED Final   Coronavirus 229E NOT DETECTED NOT DETECTED Final   Coronavirus HKU1 NOT DETECTED NOT DETECTED Final   Coronavirus NL63 NOT DETECTED NOT DETECTED Final   Coronavirus OC43 NOT DETECTED NOT DETECTED Final   Metapneumovirus NOT DETECTED NOT DETECTED Final   Rhinovirus / Enterovirus NOT DETECTED NOT DETECTED Final   Influenza A NOT DETECTED NOT DETECTED Final   Influenza B NOT DETECTED NOT DETECTED Final   Parainfluenza Virus 1 NOT DETECTED NOT DETECTED Final   Parainfluenza Virus 2 NOT DETECTED NOT DETECTED Final   Parainfluenza Virus 3 NOT DETECTED NOT DETECTED Final   Parainfluenza Virus 4 NOT DETECTED NOT DETECTED Final   Respiratory Syncytial Virus NOT DETECTED NOT DETECTED Final   Bordetella pertussis NOT DETECTED NOT DETECTED Final   Chlamydophila pneumoniae NOT DETECTED NOT DETECTED Final   Mycoplasma pneumoniae NOT DETECTED NOT DETECTED Final    Comment: Performed at [facility], [place] [address] [place], [place] [address]         Radiology Studies: Dg Chest Port 1 View  Result Date: 02/12/2018 CLINICAL DATA:  Follow-up pulmonary infiltrates. EXAM: PORTABLE CHEST 1 VIEW COMPARISON:  Chest CT and chest radiograph, 02/07/2018 FINDINGS: Patchy, hazy airspace consolidation right lung at along the  peripheral left mid lung is stable. More dense consolidation at the left lung base is also unchanged. No new lung abnormalities. Lungs are hyperexpanded. No  pneumothorax. IMPRESSION: No change from the most recent prior exams. Persist airspace lung opacities. Electronically Signed   By: Lajean Manes M.D.   On: 02/12/2018 09:02      Scheduled Meds: . aspirin  325 mg Oral Daily  . diclofenac sodium  2 g Topical QID  . enoxaparin (LOVENOX) injection  30 mg Subcutaneous Q24H  . famotidine  20 mg Oral BID  . fluticasone  2 spray Each Nare Daily  . lenalidomide  25 mg Oral Daily  . loratadine  10 mg Oral Daily  . mouth rinse  15 mL Mouth Rinse BID  . methylPREDNISolone (SOLU-MEDROL) injection  40 mg Intravenous Q24H  . ondansetron  4 mg Oral Q6H  . simethicone  80 mg Oral QID  . sodium chloride flush  3 mL Intravenous Q12H  . tadalafil (PAH)  40 mg Oral Daily  . umeclidinium-vilanterol  1 puff Inhalation Daily   Continuous Infusions: . sodium chloride       LOS: 5 days    Time spent in minutes: [place], MD [facility] Pager: [email or web address] Password [facility] 02/12/2018, 5:03 PM

## 2018-02-13 LAB — QUANTIFERON-TB GOLD PLUS (RQFGPL)
QuantiFERON Mitogen Value: 0.03 IU/mL
QuantiFERON Nil Value: 0.02 IU/mL
QuantiFERON TB1 Ag Value: 0.02 IU/mL
QuantiFERON TB2 Ag Value: 0.02 IU/mL

## 2018-02-13 LAB — QUANTIFERON-TB GOLD PLUS: QUANTIFERON-TB GOLD PLUS: UNDETERMINED

## 2018-02-13 NOTE — Progress Notes (Signed)
PROGRESS NOTE    [name] [name]   UXL:[id number]  DOB: May 10, 1961  DOA: 02/07/2018 PCP: Patient, No Pcp Per   Brief Narrative:  [name] with multiple Myeloma on Revlimid (Dr Lebron Conners), severe pulm HTN, Grade 2dCHF, COPD gold 4 (never smoked), bronchiectasis, chronic resp failure with hypoxia, left empyema s/p VATS in 2016 with remaining loculated effusion now.  He was admitted from 3/25-3/28 with dyspnea and presyncope and treated for R PNA and Colitis and d/c'd on 5 more days of Levaquin and 10 more days of Flagyl.  Returns for respiratory distress. He called EMS, was hypoxic in 80s and was started on CPAP, given solumedrol 125, Mag and '5mg'$  Albulterol. Transitioned to BiPAP in ED.     Lactic acid 2.35  CT chest: Diffuse patchy infiltrate throughout the right upper lobe consistent with acute pneumonia.Chronic calcified pleural plaques bilaterally right greater than left.Chronic but stable air-fluid collection in the left pleural space inferiorly with associated consolidation. The overall appearance is stable from the prior exam. Changes of chronic pulmonary arterial enlargement consistent with hypertension.  Subjective: Pt reports he and his family not ready for discharge today. Requesting one more day.    Assessment & Plan:   Principal Problem:   Acute on chronic respiratory failure with hypoxia/ COPD exacerbation  Recent HCAP  COPD GOLD IV criteria but never smoked / bronchiectasis Chronic L pleural effusion (VATS in the past) - Influenza neg, Resp virus panel neg - recent pneumonia was right sided   - Pro calcitonin neg, suggesting that right sided infiltrates are not a bacterial infection-  Pt completed antibiotics  - Plan will be to discharge on steroids as recommended by lung specialist.   Active Problems:   High serum lactate - LA 2.35 has normalized  Left lower abdominal pain - tender in LLQ and lower mid abdomen- hurts when he moves and when he lifts his legs - no nausea,  vomiting or diarrhea but does have bloating - abdomen tympanic - this pain may be musculoskeletal- K pain, Voltaren gel ordered - Pepcid BID and Mylicon QID ordered for gas   - 4/8- L lower abd pain resolved and now Left upper abd pain-clearly is musculoskeletal on exam- he states he is eating well- RNs need to continue Volatren and k pad  Hypotension Grade II diastolic dysfunction, severe pulm HTN - hold diuretic as pt does not appear hypervolemic -received 750 NS boluses in ED and then on 75 cc/hr- eating and drinking normally-  d/c'd IVF yesterday  - it looks like SBP 80-90 is his baseline-   SVT - resolved, no symptoms - d/c telemetry   Multiple myeloma  - per oncologist: No active cancer treatment at this time including no dexamethasone for myeloma therapy.  Patient will need to recover substantially and remain infection free for at least 2 weeks prior to resumption of therapy in my clinic.   DVT prophylaxis: Lovenox Code Status: full code Family Communication: none at bedside.  Disposition Plan: d/c next am on steroids with pulm f/u in 2 wks, started d/c planning Consultants:   Pulmonology  Oncology Procedures:   none Antimicrobials:  Anti-infectives (From admission, onward)   Start     Dose/Rate Route Frequency Ordered Stop   02/07/18 2200  vancomycin (VANCOCIN) 500 mg in sodium chloride 0.9 % 100 mL IVPB  Status:  Discontinued     500 mg 100 mL/hr over 60 Minutes Intravenous Every 12 hours 02/07/18 0840 02/08/18 1656   02/07/18 1400  ceFEPIme (  MAXIPIME) 1 g in sodium chloride 0.9 % 100 mL IVPB  Status:  Discontinued     1 g 200 mL/hr over 30 Minutes Intravenous Every 8 hours 02/07/18 0819 02/08/18 1656   02/07/18 0800  vancomycin (VANCOCIN) IVPB 1000 mg/200 mL premix     1,000 mg 200 mL/hr over 60 Minutes Intravenous  Once 02/07/18 0707 02/07/18 0922   02/07/18 0730  ceFEPIme (MAXIPIME) 1 g in sodium chloride 0.9 % 100 mL IVPB     1 g 200 mL/hr over 30 Minutes  Intravenous  Once 02/07/18 0705 02/07/18 0922       Objective: Vitals:   02/13/18 0033 02/13/18 0420 02/13/18 0759 02/13/18 0836  BP: 100/65 109/64 93/62   Pulse: 81 82 80   Resp: '16 15 18   '$ Temp: 98 F (36.7 C) 98.3 F (36.8 C) 98.9 F (37.2 C)   TempSrc: Oral Oral Oral   SpO2: 98% 98% 100% 99%  Weight:  40.2 kg (88 lb 11.2 oz)    Height:        Intake/Output Summary (Last 24 hours) at 02/13/2018 1523 Last data filed at 02/13/2018 1520 Gross per 24 hour  Intake 723 ml  Output 1250 ml  Net -527 ml   Filed Weights   02/11/18 0622 02/12/18 0522 02/13/18 0420  Weight: 41.8 kg (92 lb 3.2 oz) 40.8 kg (90 lb) 40.2 kg (88 lb 11.2 oz)    Examination: General exam: Appears comfortable , in nad. HEENT: PERRLA, oral mucosa moist, no sclera icterus or thrush Respiratory system: b/l basilar crackles- Respiratory effort normal. Cardiovascular system: S1 & S2 heard, RRR.   Gastrointestinal system: Abdomen soft, tender in LUQ and epigastrium, nondistended. Normal bowel sound. No organomegaly Central nervous system: Alert and oriented. No focal neurological deficits. Extremities: No cyanosis, clubbing or edema Skin: No rashes or ulcers Psychiatry:  Mood & affect appropriate.     Data Reviewed: I have personally reviewed following labs and imaging studies  CBC: Recent Labs  Lab 02/07/18 0622 02/07/18 0946 02/08/18 0518 02/09/18 0445  WBC 13.7* 10.4 13.2* 10.5  NEUTROABS 10.4*  --   --   --   HGB 9.5* 10.2* 8.7* 9.1*  HCT 30.7* 32.9* 27.5* 29.2*  MCV 76.6* 76.5* 74.5* 76.0*  PLT 639* 598* 664* 740*   Basic Metabolic Panel: Recent Labs  Lab 02/07/18 0622 02/07/18 0946 02/08/18 0518 02/09/18 0445  NA 135  --  134* 136  K 4.3  --  4.0 4.1  CL 95*  --  100* 100*  CO2 30  --  26 29  GLUCOSE 88  --  77 87  BUN <5*  --  8 5*  CREATININE 0.65 0.65 0.59* 0.71  CALCIUM 8.5*  --  7.1* 7.6*   GFR: Estimated Creatinine Clearance: 59.3 mL/min (by C-G formula based on SCr  of 0.71 mg/dL). Liver Function Tests: No results for input(s): AST, ALT, ALKPHOS, BILITOT, PROT, ALBUMIN in the last 168 hours. No results for input(s): LIPASE, AMYLASE in the last 168 hours. No results for input(s): AMMONIA in the last 168 hours. Coagulation Profile: No results for input(s): INR, PROTIME in the last 168 hours. Cardiac Enzymes: No results for input(s): CKTOTAL, CKMB, CKMBINDEX, TROPONINI in the last 168 hours. BNP (last 3 results) No results for input(s): PROBNP in the last 8760 hours. HbA1C: No results for input(s): HGBA1C in the last 72 hours. CBG: No results for input(s): GLUCAP in the last 168 hours. Lipid Profile: No results for input(s):  CHOL, HDL, LDLCALC, TRIG, CHOLHDL, LDLDIRECT in the last 72 hours. Thyroid Function Tests: No results for input(s): TSH, T4TOTAL, FREET4, T3FREE, THYROIDAB in the last 72 hours. Anemia Panel: No results for input(s): VITAMINB12, FOLATE, FERRITIN, TIBC, IRON, RETICCTPCT in the last 72 hours. Urine analysis:    Component Value Date/Time   COLORURINE YELLOW 12/22/2017 [place] 12/22/2017 1323   LABSPEC 1.011 12/22/2017 1323   PHURINE 8.0 12/22/2017 1323   GLUCOSEU NEGATIVE 12/22/2017 1323   HGBUR NEGATIVE 12/22/2017 1323   BILIRUBINUR NEGATIVE 12/22/2017 1323   KETONESUR NEGATIVE 12/22/2017 1323   PROTEINUR NEGATIVE 12/22/2017 1323   UROBILINOGEN 0.2 11/11/2012 0145   NITRITE NEGATIVE 12/22/2017 1323   LEUKOCYTESUR NEGATIVE 12/22/2017 1323   Sepsis Labs: '@LABRCNTIP'$ (procalcitonin:4,lacticidven:4) ) Recent Results (from the past 240 hour(s))  Blood culture (routine x 2)     Status: None   Collection Time: 02/07/18  7:28 AM  Result Value Ref Range Status   Specimen Description BLOOD RIGHT FOREARM  Final   Special Requests   Final    BOTTLES DRAWN AEROBIC AND ANAEROBIC Blood Culture adequate volume   Culture   Final    NO GROWTH 5 DAYS Performed at [facility], [place] [address], [place],  [place] [address]    Report Status 02/12/2018 FINAL  Final  Blood culture (routine x 2)     Status: None   Collection Time: 02/07/18  7:30 AM  Result Value Ref Range Status   Specimen Description BLOOD LEFT ANTECUBITAL  Final   Special Requests   Final    BOTTLES DRAWN AEROBIC AND ANAEROBIC Blood Culture adequate volume   Culture   Final    NO GROWTH 5 DAYS Performed at [facility], [place] [address], [place], [place] [address]    Report Status 02/12/2018 FINAL  Final  Respiratory Panel by PCR     Status: None   Collection Time: 02/07/18  9:46 AM  Result Value Ref Range Status   Adenovirus NOT DETECTED NOT DETECTED Final   Coronavirus 229E NOT DETECTED NOT DETECTED Final   Coronavirus HKU1 NOT DETECTED NOT DETECTED Final   Coronavirus NL63 NOT DETECTED NOT DETECTED Final   Coronavirus OC43 NOT DETECTED NOT DETECTED Final   Metapneumovirus NOT DETECTED NOT DETECTED Final   Rhinovirus / Enterovirus NOT DETECTED NOT DETECTED Final   Influenza A NOT DETECTED NOT DETECTED Final   Influenza B NOT DETECTED NOT DETECTED Final   Parainfluenza Virus 1 NOT DETECTED NOT DETECTED Final   Parainfluenza Virus 2 NOT DETECTED NOT DETECTED Final   Parainfluenza Virus 3 NOT DETECTED NOT DETECTED Final   Parainfluenza Virus 4 NOT DETECTED NOT DETECTED Final   Respiratory Syncytial Virus NOT DETECTED NOT DETECTED Final   Bordetella pertussis NOT DETECTED NOT DETECTED Final   Chlamydophila pneumoniae NOT DETECTED NOT DETECTED Final   Mycoplasma pneumoniae NOT DETECTED NOT DETECTED Final    Comment: Performed at [facility], [place] [address] [place], [place] [address]         Radiology Studies: Dg Chest Port 1 View  Result Date: 02/12/2018 CLINICAL DATA:  Follow-up pulmonary infiltrates. EXAM: PORTABLE CHEST 1 VIEW COMPARISON:  Chest CT and chest radiograph, 02/07/2018 FINDINGS: Patchy, hazy airspace consolidation right lung at along the peripheral left mid lung is stable. More dense  consolidation at the left lung base is also unchanged. No new lung abnormalities. Lungs are hyperexpanded. No pneumothorax. IMPRESSION: No change from the most recent prior exams.  Persist airspace lung opacities. Electronically Signed   By: Lajean Manes M.D.   On: 02/12/2018 09:02      Scheduled Meds: . aspirin  325 mg Oral Daily  . diclofenac sodium  2 g Topical QID  . enoxaparin (LOVENOX) injection  30 mg Subcutaneous Q24H  . famotidine  20 mg Oral BID  . fluticasone  2 spray Each Nare Daily  . lenalidomide  25 mg Oral Daily  . loratadine  10 mg Oral Daily  . mouth rinse  15 mL Mouth Rinse BID  . methylPREDNISolone (SOLU-MEDROL) injection  40 mg Intravenous Q24H  . ondansetron  4 mg Oral Q6H  . simethicone  80 mg Oral QID  . sodium chloride flush  3 mL Intravenous Q12H  . tadalafil (PAH)  40 mg Oral Daily  . umeclidinium-vilanterol  1 puff Inhalation Daily   Continuous Infusions: . sodium chloride       LOS: 6 days    Time spent in minutes: [place], MD [facility] Pager: [email or web address] Password [facility] 02/13/2018, 3:23 PM

## 2018-02-14 LAB — CREATININE, SERUM
Creatinine, Ser: 0.57 mg/dL — ABNORMAL LOW (ref 0.61–1.24)
GFR calc non Af Amer: >60 mL/min (ref >60)

## 2018-02-14 MED ORDER — PREDNISONE 20 MG PO TABS: 40.0000 mg | ORAL_TABLET | Freq: Every day | ORAL | 0 refills | Status: AC

## 2018-02-14 NOTE — Discharge Summary (Signed)
Physician Discharge Summary  Chamberlain Steinborn UYQ:[id number] DOB: [birth year] DOA: 02/07/2018  PCP: Patient, No Pcp Per  Admit date: 02/07/2018 Discharge date: 02/14/2018  Time spent: 36 minutes  Recommendations for Outpatient Follow-up:  1. Pt to follow up with oncology and pulmonology as outpatient.    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    Chronic respiratory failure with hypoxia (HCC)   Facility-acquired pneumonia   Multiple myeloma (HCC)   High serum lactate   Grade II diastolic dysfunction   Acute on chronic respiratory failure (HCC)   Pulmonary infiltrate on chest x-ray   Discharge Condition: stable  Diet recommendation: regular diet  Filed Weights   02/11/18 0622 02/12/18 0522 02/13/18 0420  Weight: 41.8 kg (92 lb 3.2 oz) 40.8 kg (90 lb) 40.2 kg (88 lb 11.2 oz)    History of present illness:  [name] [name] with multiple Myeloma on Revlimid (Dr Lebron Conners), severe pulm HTN, Grade 2dCHF, COPD gold 4 (never smoked), bronchiectasis, chronic resp failure with hypoxia, left empyema s/p VATS in 2016 with remaining loculated effusion now.  He was admitted from 3/25-3/28 with dyspnea and presyncope and treated for R PNA and Colitis and d/c'd on 5 more days of Levaquin and 10 more days of Flagyl.  Returns for respiratory distress. He called EMS, was hypoxic in 80s and was started on CPAP, given solumedrol 125, Mag and 63m Albulterol. Transitioned to BiPAP in ED.    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  - Plan will be to discharge on steroids as recommended by lung specialist.   Active Problems:   High serum lactate - LA 2.35 has normalized  Left lower abdominal pain - resolved  Hypotension Grade II diastolic dysfunction, severe pulm HTN - resolved  - it looks like SBP 80-90 is his baseline-   SVT - resolved, no symptoms   Multiple myeloma  - per oncologist: No active cancer treatment at this time including no dexamethasone for myeloma therapy. Patient will need to recover substantially and remain infection free for at least 2 weeks prior to resumption of therapy in my clinic.   Procedures:  None  Consultations:  pulmonology  Discharge Exam: Vitals:   02/14/18 0801 02/14/18 1242  BP: 92/61 97/63  Pulse: 79 75  Resp:    Temp: 98 F (36.7 C) (!) 97.5 F (36.4 C)  SpO2: 97% 100%    General: Pt in nad, alert and awake Cardiovascular: rrr, no rubs Respiratory: no increased wob, no wheezes, [place] in place  Discharge Instructions   Discharge Instructions    Diet - low sodium heart healthy   Complete by:  As directed    Increase activity slowly   Complete by:  As directed      Allergies as of 02/14/2018   No Known Allergies     Medication List    STOP taking these medications   dexamethasone 4 MG tablet Commonly known as:  DECADRON   lenalidomide 25 MG capsule Commonly  known as:  REVLIMID   levofloxacin 500 MG tablet Commonly known as:  LEVAQUIN   metroNIDAZOLE 500 MG tablet Commonly known as:  FLAGYL     TAKE these medications   albuterol 108 (90 Base) MCG/ACT inhaler Commonly known as:  PROVENTIL HFA;VENTOLIN HFA Inhale 2 puffs into the lungs every 6 (six) hours as needed for wheezing or shortness of breath.   aspirin 325 MG EC tablet Take 1 tablet (325 mg total) by mouth daily.   NYQUIL HBP COLD & FLU 15-6.25-325 MG/15ML Liqd Generic drug:  DM-Doxylamine-Acetaminophen Take 15 mLs by  mouth at bedtime as needed (sleep).   ondansetron 8 MG tablet Commonly known as:  ZOFRAN Take 1 tablet (8 mg total) by mouth 2 (two) times daily as needed (Nausea or vomiting).   OXYGEN 2lpm BEDTIME ONLY   predniSONE 20 MG tablet Commonly known as:  DELTASONE Take 2 tablets (40 mg total) by mouth daily with breakfast for 14 days.   tadalafil (PAH) 20 MG tablet Commonly known as:  ADCIRCA Take 2 tablets (40 mg total) by mouth daily. What changed:  how much to take   traMADol 50 MG tablet Commonly known as:  ULTRAM Take 50 mg by mouth every 6 (six) hours as needed for moderate pain.   umeclidinium-vilanterol 62.5-25 MCG/INH Aepb Commonly known as:  ANORO ELLIPTA Inhale 1 puff into the lungs daily.            Durable Medical Equipment  (From admission, onward)        Start     Ordered   02/14/18 1329  DME Oxygen  Once    Question Answer Comment  Mode or (Route) Nasal cannula   Liters per Minute 3   Oxygen delivery system Gas      02/14/18 1328   02/09/18 0919  For home use only DME oxygen  Once    Question Answer Comment  Mode or (Route) Nasal cannula   Liters per Minute 3   Frequency Continuous (stationary and portable oxygen unit needed)   Oxygen conserving device Yes   Oxygen delivery system Gas      02/09/18 0918     No Known Allergies Follow-up Information    [facility] Follow up on 03/05/2018.   Why:  @ 1125 am with Domenica Fail for Hospital Follow Up Appointment- please contact office if you can not make the scheduled appointment as soon as possible to reschedule.   Contact information: [facility] [id number] [phone]           The results of significant diagnostics from this hospitalization (including imaging, microbiology, ancillary and laboratory) are listed below for reference.    Significant Diagnostic Studies: Dg Chest 2 View  Result Date: 01/26/2018 CLINICAL  DATA:  Acute onset shortness of breath today. Weakness, dizziness, and blurred vision. Undergoing chemotherapy for multiple myeloma. EXAM: CHEST - 2 VIEW COMPARISON:  CT on 01/12/2018 and chest radiograph on 12/11/2017. FINDINGS: Heart size is stable. Chronic left-sided pleural thickening again noted. No evidence of pneumothorax. New airspace opacity is seen in the inferior aspect of the right upper lobe, suspicious for pneumonia. Enlargement of pulmonary arteries is seen, consistent with pulmonary arterial hypertension. IMPRESSION: New right upper lobe airspace opacity, suspicious for pneumonia. Stable left pleural thickening. Stable pulmonary arterial enlargement, consistent pulmonary artery hypertension. Electronically Signed   By: Earle Gell M.D.   On: 01/26/2018 15:40   Ct Head Wo Contrast  Result Date: 01/26/2018 CLINICAL DATA:  Severe headache, blurred vision EXAM: CT HEAD WITHOUT CONTRAST TECHNIQUE: Contiguous axial images were obtained from the base of the skull through the vertex without intravenous contrast. COMPARISON:  None FINDINGS: Brain: No acute intracranial abnormality. Specifically, no hemorrhage, hydrocephalus, mass lesion, acute infarction, or significant intracranial injury. Vascular: No hyperdense vessel or unexpected calcification. Skull: No acute calvarial abnormality. Sinuses/Orbits: Mucosal thickening in the right ethmoid air cells. Near complete opacification of the right maxillary sinus. Mastoid air cells are clear. Other: None IMPRESSION: No acute intracranial abnormality. Electronically Signed   By: Rolm Baptise M.D.   On: 01/26/2018 15:21   Ct Chest Wo Contrast  Result Date: 02/07/2018 CLINICAL DATA:  Shortness of breath and wheezing EXAM: CT CHEST WITHOUT CONTRAST TECHNIQUE: Multidetector CT imaging of the chest was performed following the standard protocol without IV contrast. COMPARISON:  Plain film from earlier in the same day., CT from 01/12/2018 FINDINGS: Cardiovascular:  Somewhat limited due to the lack of IV contrast. Aortic atherosclerotic changes are noted. No aneurysmal dilatation is seen. The pulmonary artery is enlarged with the main pulmonary trunk measuring approximately 4.8 cm. Mild coronary calcifications are noted. Mediastinum/Nodes: The esophagus is within normal limits. No significant hilar or mediastinal adenopathy is noted. The thoracic inlet is within normal limits. Lungs/Pleura: Emphysematous changes are seen. A chronic air-fluid collection in the left pleural space is again identified and stable in appearance. Some chronic associated consolidation in the left lower lobe is noted as well. Some mild scarring is noted in the residual aerated left lung. Patchy infiltrative changes are noted in the right upper lobe significantly increased from the prior CT but similar to that seen on recent plain film examination. Some chronic scarring in the right lower lobe is noted. Diffuse calcified pleural plaques are noted on the right stable from the prior exam. A few calcified plaques are noted on the left but to a significantly lesser degree than that on the right. No sizable effusion is noted. Upper Abdomen: Within normal limits. Musculoskeletal: No acute bony abnormality is noted. IMPRESSION: Diffuse patchy infiltrate throughout the right upper lobe consistent with acute pneumonia. Chronic calcified pleural plaques bilaterally right greater than left. Chronic but stable air-fluid collection in the left pleural space inferiorly with associated consolidation. The overall appearance is stable from the prior exam. Changes of chronic pulmonary arterial enlargement consistent with hypertension. Aortic Atherosclerosis (ICD10-I70.0) and Emphysema (ICD10-J43.9). Electronically Signed   By: Inez Catalina M.D.   On: 02/07/2018 08:08   Ct Abdomen Pelvis W Contrast  Result Date: 01/24/2018 CLINICAL DATA:  Left-sided abdominal pain for 1 year. EXAM: CT ABDOMEN AND PELVIS WITH CONTRAST  TECHNIQUE: Multidetector CT imaging of the abdomen and pelvis was performed using the standard protocol following bolus administration of intravenous contrast. CONTRAST:  141m ISOVUE-300 IOPAMIDOL (ISOVUE-300) INJECTION 61% COMPARISON:  CT scan 12/23/2017 FINDINGS: Lower chest: Chronic changes noted at the left lung base with a chronic complex pleural fluid collection with thick rim of slightly enhancing soft tissue density. Stable more nodular pleural density with overlying atelectasis and calcifications. There are densely calcified pleural plaques on the right side also. Subpleural atelectasis and scarring changes noted along with bronchiectasis and enlarged pulmonary arteries. Hepatobiliary: 2 small foci of enhancement, one in the left hepatic lobe at the dome and the other in segment 6 inferiorly and posteriorly. These are likely vascular shunts or flash filling hemangiomas. No worrisome hepatic lesions or intrahepatic biliary dilatation. The gallbladder is mildly contracted. No  significant common bile duct dilatation. Pancreas: No mass, inflammation or ductal dilatation. Spleen: Normal size. Peripheral calcifications possibly due to prior trauma. Adrenals/Urinary Tract: The adrenal glands and kidneys are unremarkable and stable. Small cysts are noted. No worrisome renal lesions or hydronephrosis. Stomach/Bowel: The stomach, duodenum and small bowel are grossly normal. No acute inflammatory changes, mass lesions or obstructive findings. The third portion of the duodenum does cross the midline but immediately swings back into the right upper quadrant and most of the small bowel is in the right abdomen. The cecum is in the right lower quadrant. There are swirling mesenteric vessels and findings suggest a loose mesenteric attachment. No obstruction or midgut volvulus. Moderate inflammatory changes involving the colon beginning at the hepatic flexure and involving the transverse colon, descending colon and sigmoid  colon and rectum. This is likely an inflammatory or infectious colitis. The terminal ileum is normal. The appendix is normal. Vascular/Lymphatic: Small scattered mesenteric and retroperitoneal lymph nodes but no mass or overt adenopathy. Reproductive: The prostate gland and seminal vesicles are unremarkable. Other: Mesenteric edema and a small amount of free abdominal and pelvic fluid likely related to the colonic inflammatory process. Musculoskeletal: Stable appearing lytic lesion involving the L4 vertebral body. No new bone lesions. IMPRESSION: 1. Chronic changes at the lung bases as described above. 2. Colonic inflammatory process, likely infectious or inflammatory colitis. No mass or obstruction. 3. Small enhancing hepatic lesions are likely vascular shunts or possible flash filling hemangiomas. Recommend attention on future studies. Electronically Signed   By: Marijo Sanes M.D.   On: 01/24/2018 11:52   Dg Chest Port 1 View  Result Date: 02/12/2018 CLINICAL DATA:  Follow-up pulmonary infiltrates. EXAM: PORTABLE CHEST 1 VIEW COMPARISON:  Chest CT and chest radiograph, 02/07/2018 FINDINGS: Patchy, hazy airspace consolidation right lung at along the peripheral left mid lung is stable. More dense consolidation at the left lung base is also unchanged. No new lung abnormalities. Lungs are hyperexpanded. No pneumothorax. IMPRESSION: No change from the most recent prior exams. Persist airspace lung opacities. Electronically Signed   By: Lajean Manes M.D.   On: 02/12/2018 09:02   Dg Chest Port 1 View  Result Date: 02/07/2018 CLINICAL DATA:  57 y/o M; acute onset respiratory distress. History of multiple myeloma. EXAM: PORTABLE CHEST 1 VIEW COMPARISON:  01/26/2018 chest radiograph.  01/12/2018 chest CT. FINDINGS: Stable cardiomegaly given projection and technique. Stable enlargement of pulmonary arteries compatible pulmonary artery hypertension. Stable left large pleural collection and pleural thickening. Patchy  opacities in the right upper lobe are mildly increased. No acute osseous abnormality is evident. IMPRESSION: Increased patchy opacities in the right upper lobe may represent residual/recurrent pneumonia. Stable left-sided pleural collection and thickening. Stable cardiac silhouette. Electronically Signed   By: Kristine Garbe M.D.   On: 02/07/2018 06:43    Microbiology: Recent Results (from the past 240 hour(s))  Blood culture (routine x 2)     Status: None   Collection Time: 02/07/18  7:28 AM  Result Value Ref Range Status   Specimen Description BLOOD RIGHT FOREARM  Final   Special Requests   Final    BOTTLES DRAWN AEROBIC AND ANAEROBIC Blood Culture adequate volume   Culture   Final    NO GROWTH 5 DAYS Performed at [facility], [address], [place], [place] [address]    Report Status 02/12/2018 FINAL  Final  Blood culture (routine x 2)     Status: None   Collection Time: 02/07/18  7:30  AM  Result Value Ref Range Status   Specimen Description BLOOD LEFT ANTECUBITAL  Final   Special Requests   Final    BOTTLES DRAWN AEROBIC AND ANAEROBIC Blood Culture adequate volume   Culture   Final    NO GROWTH 5 DAYS Performed at [facility], [address] [place], [place] [address]    Report Status 02/12/2018 FINAL  Final  Respiratory Panel by PCR     Status: None   Collection Time: 02/07/18  9:46 AM  Result Value Ref Range Status   Adenovirus NOT DETECTED NOT DETECTED Final   Coronavirus 229E NOT DETECTED NOT DETECTED Final   Coronavirus HKU1 NOT DETECTED NOT DETECTED Final   Coronavirus NL63 NOT DETECTED NOT DETECTED Final   Coronavirus OC43 NOT DETECTED NOT DETECTED Final   Metapneumovirus NOT DETECTED NOT DETECTED Final   Rhinovirus / Enterovirus NOT DETECTED NOT DETECTED Final   Influenza A NOT DETECTED NOT DETECTED Final   Influenza B NOT DETECTED NOT DETECTED Final   Parainfluenza Virus 1 NOT DETECTED NOT DETECTED Final   Parainfluenza Virus 2 NOT  DETECTED NOT DETECTED Final   Parainfluenza Virus 3 NOT DETECTED NOT DETECTED Final   Parainfluenza Virus 4 NOT DETECTED NOT DETECTED Final   Respiratory Syncytial Virus NOT DETECTED NOT DETECTED Final   Bordetella pertussis NOT DETECTED NOT DETECTED Final   Chlamydophila pneumoniae NOT DETECTED NOT DETECTED Final   Mycoplasma pneumoniae NOT DETECTED NOT DETECTED Final    Comment: Performed at [facility], [place] [address], [place], [place] [address]     Labs: Basic Metabolic Panel: Recent Labs  Lab 02/08/18 0518 02/09/18 0445 02/14/18 0431  NA 134* 136  --   K 4.0 4.1  --   CL 100* 100*  --   CO2 26 29  --   GLUCOSE 77 87  --   BUN 8 5*  --   CREATININE 0.59* 0.71 0.57*  CALCIUM 7.1* 7.6*  --    Liver Function Tests: No results for input(s): AST, ALT, ALKPHOS, BILITOT, PROT, ALBUMIN in the last 168 hours. No results for input(s): LIPASE, AMYLASE in the last 168 hours. No results for input(s): AMMONIA in the last 168 hours. CBC: Recent Labs  Lab 02/08/18 0518 02/09/18 0445  WBC 13.2* 10.5  HGB 8.7* 9.1*  HCT 27.5* 29.2*  MCV 74.5* 76.0*  PLT 664* 635*   Cardiac Enzymes: No results for input(s): CKTOTAL, CKMB, CKMBINDEX, TROPONINI in the last 168 hours. BNP: BNP (last 3 results) Recent Labs    01/26/18 1445 01/26/18 1900  BNP 465.9* 456.2*    ProBNP (last 3 results) No results for input(s): PROBNP in the last 8760 hours.  CBG: No results for input(s): GLUCAP in the last 168 hours.   Signed:  Velvet Bathe MD.  [facility] 02/14/2018, 1:28 PM

## 2018-02-14 NOTE — Progress Notes (Signed)
SATURATION QUALIFICATIONS: (This note is used to comply with regulatory documentation for home oxygen)  Patient Saturations on Room Air at Rest = 92%  Patient Saturations on Room Air while Ambulating = 87%  Patient Saturations on 3 Liters of oxygen while Ambulating = 94%  Please briefly explain why patient needs home oxygen:  Pt and his friend stated that he was on 3L of home 02 prior to coming to the hospital. They stated he has trouble breathing when he is on 2L while ambulating at home.

## 2018-02-14 NOTE — Progress Notes (Signed)
Pt and his friend Anderson [place] stated he was on 3l of 02 at home prior to coming to the hospital. AVS stated pt to wear 02 2L at bed time only. Pt and his friend stated he would have trouble breathing if he only wore 02 at bedtime. Clarified with Dr. Wendee Beavers that pt is suppose to continue home 02 at 3L. Updated pt and friend of what MD said.    Spoke with Case manger who stated she spoke with [facility] and pt ready for d/c.

## 2018-02-14 NOTE — Progress Notes (Signed)
Discharge instruction reviewed with pt and friend. They have no questions at this time. Prescription given to pt. Pt states he is ready to go. IV d/c.

## 2018-02-16 ENCOUNTER — Inpatient Hospital Stay: Payer: Self-pay

## 2018-02-16 ENCOUNTER — Other Ambulatory Visit: Payer: Self-pay | Admitting: Hematology and Oncology

## 2018-02-16 DIAGNOSIS — R601 Generalized edema: Secondary | ICD-10-CM

## 2018-02-16 DIAGNOSIS — R5383 Other fatigue: Secondary | ICD-10-CM

## 2018-02-16 DIAGNOSIS — R0602 Shortness of breath: Secondary | ICD-10-CM

## 2018-02-16 DIAGNOSIS — B029 Zoster without complications: Secondary | ICD-10-CM

## 2018-02-16 LAB — CBC WITH DIFFERENTIAL (CANCER CENTER ONLY)
BASOS PCT: 0 %
Basophils Absolute: 0 10*3/uL (ref 0.0–0.1)
Eosinophils Absolute: 0 10*3/uL (ref 0.0–0.5)
Eosinophils Relative: 0 %
HEMATOCRIT: 33.3 % — AB (ref 38.4–49.9)
Hemoglobin: 10.7 g/dL — ABNORMAL LOW (ref 13.0–17.1)
LYMPHS ABS: 1 10*3/uL (ref 0.9–3.3)
Lymphocytes Relative: 8 %
MCH: 24.2 pg — AB (ref 27.2–33.4)
MCHC: 32.1 g/dL (ref 32.0–36.0)
MCV: 75.3 fL — AB (ref 79.3–98.0)
MONO ABS: 0.5 10*3/uL (ref 0.1–0.9)
MONOS PCT: 4 %
NEUTROS ABS: 10.4 10*3/uL — AB (ref 1.5–6.5)
Neutrophils Relative %: 88 %
Platelet Count: 375 10*3/uL (ref 140–400)
RBC: 4.42 MIL/uL (ref 4.20–5.82)
RDW: 17.3 % — ABNORMAL HIGH (ref 11.0–14.6)
WBC Count: 12 10*3/uL — ABNORMAL HIGH (ref 4.0–10.3)

## 2018-02-16 LAB — CMP (CANCER CENTER ONLY)
ALBUMIN: 2.4 g/dL — AB (ref 3.5–5.0)
ALK PHOS: 69 U/L (ref 40–150)
ALT: 15 U/L (ref 0–55)
ANION GAP: 7 (ref 3–11)
AST: 15 U/L (ref 5–34)
BUN: 11 mg/dL (ref 7–26)
CALCIUM: 8.6 mg/dL (ref 8.4–10.4)
CO2: 30 mmol/L — AB (ref 22–29)
Chloride: 92 mmol/L — ABNORMAL LOW (ref 98–109)
Creatinine: 0.67 mg/dL — ABNORMAL LOW (ref 0.70–1.30)
GFR, Est AFR Am: >60 mL/min (ref >60)
GFR, Estimated: >60 mL/min (ref >60)
GLUCOSE: 170 mg/dL — AB (ref 70–140)
Potassium: 4.4 mmol/L (ref 3.5–5.1)
SODIUM: 129 mmol/L — AB (ref 136–145)
Total Bilirubin: 0.3 mg/dL (ref 0.2–1.2)
Total Protein: 7.2 g/dL (ref 6.4–8.3)

## 2018-02-16 MED ORDER — HYDROMORPHONE HCL 2 MG PO TABS: 2.0000 mg | ORAL_TABLET | ORAL | 0 refills | Status: AC | PRN

## 2018-02-16 MED ORDER — NORTRIPTYLINE HCL 25 MG PO CAPS: 25.0000 mg | ORAL_CAPSULE | Freq: Every day | ORAL | 0 refills | Status: DC

## 2018-02-16 MED ORDER — ACYCLOVIR 400 MG PO TABS: 400.0000 mg | ORAL_TABLET | Freq: Every day | ORAL | 0 refills | Status: DC

## 2018-02-16 MED ORDER — ACYCLOVIR 400 MG PO TABS: 400.0000 mg | ORAL_TABLET | Freq: Every day | ORAL | 0 refills | Status: AC

## 2018-02-16 MED FILL — HYDROmorphone HCL 2 MG TABS: 2 | 8 days supply | Qty: 50 | Fill #0

## 2018-02-16 MED FILL — NORTRIPTYLINE HCL 25 MG CAP: 25 | 30 days supply | Qty: 30 | Fill #0

## 2018-02-16 MED FILL — ACYCLOVIR 400 MG TABLET: 400 | 14 days supply | Qty: 70 | Fill #0

## 2018-02-16 NOTE — Progress Notes (Signed)
Upon assessment, patient complains of rash on left side of upper abdomen/chest going around towards his back. Dr. Lebron Conners made aware and over to infusion to assess patient. No treatment today per Dr. Lebron Conners and patient scheduled to see Dr. Lebron Conners in the office on Friday 02/20/18. Family friend with patient aware as well as patient made aware via interpreter.

## 2018-02-17 ENCOUNTER — Other Ambulatory Visit: Payer: Self-pay

## 2018-02-17 DIAGNOSIS — C9000 Multiple myeloma not having achieved remission: Secondary | ICD-10-CM

## 2018-02-19 ENCOUNTER — Ambulatory Visit: Payer: Self-pay

## 2018-02-19 ENCOUNTER — Other Ambulatory Visit: Payer: Self-pay

## 2018-02-20 ENCOUNTER — Inpatient Hospital Stay: Payer: Self-pay

## 2018-02-20 ENCOUNTER — Telehealth: Payer: Self-pay | Admitting: Hematology and Oncology

## 2018-02-20 ENCOUNTER — Inpatient Hospital Stay: Payer: Self-pay | Admitting: Hematology and Oncology

## 2018-02-20 NOTE — Telephone Encounter (Signed)
Patient called to reschedule due to weather and not feeling well

## 2018-02-23 ENCOUNTER — Inpatient Hospital Stay: Payer: Self-pay

## 2018-02-23 ENCOUNTER — Telehealth: Payer: Self-pay

## 2018-02-23 NOTE — Telephone Encounter (Signed)
I called to check on patient due to his absence for his infusion appointment.  Patient stated he would see [place] next week.

## 2018-02-26 ENCOUNTER — Inpatient Hospital Stay: Payer: Self-pay

## 2018-03-02 ENCOUNTER — Inpatient Hospital Stay: Payer: Self-pay

## 2018-03-03 ENCOUNTER — Encounter: Payer: Self-pay | Admitting: Hematology and Oncology

## 2018-03-03 ENCOUNTER — Inpatient Hospital Stay (HOSPITAL_BASED_OUTPATIENT_CLINIC_OR_DEPARTMENT_OTHER): Payer: Self-pay | Admitting: Hematology and Oncology

## 2018-03-03 ENCOUNTER — Inpatient Hospital Stay: Payer: Self-pay

## 2018-03-03 ENCOUNTER — Telehealth: Payer: Self-pay | Admitting: Hematology and Oncology

## 2018-03-03 VITALS — BP 124/81 | HR 90 | Temp 97.7°F | Resp 17 | Ht 60.0 in | Wt 85.6 lb

## 2018-03-03 DIAGNOSIS — B029 Zoster without complications: Secondary | ICD-10-CM

## 2018-03-03 DIAGNOSIS — J449 Chronic obstructive pulmonary disease, unspecified: Secondary | ICD-10-CM

## 2018-03-03 DIAGNOSIS — Z5111 Encounter for antineoplastic chemotherapy: Secondary | ICD-10-CM

## 2018-03-03 DIAGNOSIS — J869 Pyothorax without fistula: Secondary | ICD-10-CM

## 2018-03-03 DIAGNOSIS — I272 Pulmonary hypertension, unspecified: Secondary | ICD-10-CM

## 2018-03-03 DIAGNOSIS — C9000 Multiple myeloma not having achieved remission: Secondary | ICD-10-CM

## 2018-03-03 LAB — CMP (CANCER CENTER ONLY)
ALK PHOS: 73 U/L (ref 40–150)
ALT: 10 U/L (ref 0–55)
AST: 18 U/L (ref 5–34)
Albumin: 2.5 g/dL — ABNORMAL LOW (ref 3.5–5.0)
Anion gap: 7 (ref 3–11)
BILIRUBIN TOTAL: 0.4 mg/dL (ref 0.2–1.2)
BUN: 9 mg/dL (ref 7–26)
CO2: 27 mmol/L (ref 22–29)
CREATININE: 0.73 mg/dL (ref 0.70–1.30)
Calcium: 8.6 mg/dL (ref 8.4–10.4)
Chloride: 98 mmol/L (ref 98–109)
GFR, Est AFR Am: >60 mL/min (ref >60)
Glucose, Bld: 194 mg/dL — ABNORMAL HIGH (ref 70–140)
Potassium: 3.7 mmol/L (ref 3.5–5.1)
Sodium: 132 mmol/L — ABNORMAL LOW (ref 136–145)
TOTAL PROTEIN: 7.3 g/dL (ref 6.4–8.3)

## 2018-03-03 LAB — CBC WITH DIFFERENTIAL (CANCER CENTER ONLY)
BASOS ABS: 0 10*3/uL (ref 0.0–0.1)
Basophils Relative: 0 %
EOS ABS: 0.2 10*3/uL (ref 0.0–0.5)
EOS PCT: 2 %
HCT: 33.7 % — ABNORMAL LOW (ref 38.4–49.9)
Hemoglobin: 10.7 g/dL — ABNORMAL LOW (ref 13.0–17.1)
LYMPHS ABS: 0.9 10*3/uL (ref 0.9–3.3)
Lymphocytes Relative: 11 %
MCH: 24.8 pg — AB (ref 27.2–33.4)
MCHC: 31.8 g/dL — ABNORMAL LOW (ref 32.0–36.0)
MCV: 78.2 fL — ABNORMAL LOW (ref 79.3–98.0)
Monocytes Absolute: 0.6 10*3/uL (ref 0.1–0.9)
Monocytes Relative: 8 %
Neutro Abs: 6.3 10*3/uL (ref 1.5–6.5)
Neutrophils Relative %: 79 %
Platelet Count: 422 10*3/uL — ABNORMAL HIGH (ref 140–400)
RBC: 4.31 MIL/uL (ref 4.20–5.82)
RDW: 20.3 % — ABNORMAL HIGH (ref 11.0–14.6)
WBC: 7.9 10*3/uL (ref 4.0–10.3)

## 2018-03-03 MED ORDER — NORTRIPTYLINE HCL 25 MG PO CAPS: 25.0000 mg | ORAL_CAPSULE | Freq: Every day | ORAL | 3 refills | Status: DC

## 2018-03-03 NOTE — Telephone Encounter (Signed)
Appointments scheduled AVS/Calendar printed per 4/30 los °

## 2018-03-05 ENCOUNTER — Other Ambulatory Visit: Payer: Self-pay

## 2018-03-05 ENCOUNTER — Ambulatory Visit: Payer: Self-pay

## 2018-03-05 ENCOUNTER — Encounter (INDEPENDENT_AMBULATORY_CARE_PROVIDER_SITE_OTHER): Payer: Self-pay | Admitting: Physician Assistant

## 2018-03-05 ENCOUNTER — Ambulatory Visit (HOSPITAL_COMMUNITY)
Admission: RE | Admit: 2018-03-05 | Discharge: 2018-03-05 | Disposition: A | Discharge status: Home / Self Care | Payer: Self-pay | Source: Ambulatory Visit | Attending: Physician Assistant | Admitting: Physician Assistant

## 2018-03-05 ENCOUNTER — Ambulatory Visit (INDEPENDENT_AMBULATORY_CARE_PROVIDER_SITE_OTHER): Payer: Self-pay | Admitting: Physician Assistant

## 2018-03-05 ENCOUNTER — Other Ambulatory Visit: Payer: Self-pay | Admitting: Hematology and Oncology

## 2018-03-05 ENCOUNTER — Ambulatory Visit: Payer: Self-pay | Admitting: Hematology and Oncology

## 2018-03-05 VITALS — BP 101/69 | HR 106 | Temp 98.2°F | Ht 61.0 in | Wt 86.6 lb

## 2018-03-05 DIAGNOSIS — Z8709 Personal history of other diseases of the respiratory system: Secondary | ICD-10-CM

## 2018-03-05 DIAGNOSIS — R918 Other nonspecific abnormal finding of lung field: Secondary | ICD-10-CM | POA: Insufficient documentation

## 2018-03-05 DIAGNOSIS — K219 Gastro-esophageal reflux disease without esophagitis: Secondary | ICD-10-CM

## 2018-03-05 DIAGNOSIS — Z09 Encounter for follow-up examination after completed treatment for conditions other than malignant neoplasm: Secondary | ICD-10-CM

## 2018-03-05 DIAGNOSIS — R1013 Epigastric pain: Secondary | ICD-10-CM

## 2018-03-05 DIAGNOSIS — R0989 Other specified symptoms and signs involving the circulatory and respiratory systems: Secondary | ICD-10-CM | POA: Insufficient documentation

## 2018-03-05 DIAGNOSIS — R0689 Other abnormalities of breathing: Secondary | ICD-10-CM

## 2018-03-05 DIAGNOSIS — Z760 Encounter for issue of repeat prescription: Secondary | ICD-10-CM

## 2018-03-05 DIAGNOSIS — G893 Neoplasm related pain (acute) (chronic): Secondary | ICD-10-CM

## 2018-03-05 MED ORDER — UMECLIDINIUM-VILANTEROL 62.5-25 MCG/INH IN AEPB: 1.0000 | INHALATION_SPRAY | Freq: Every day | RESPIRATORY_TRACT | 5 refills | Status: DC

## 2018-03-05 MED ORDER — ALBUTEROL SULFATE HFA 108 (90 BASE) MCG/ACT IN AERS: 2.0000 | INHALATION_SPRAY | Freq: Four times a day (QID) | RESPIRATORY_TRACT | 5 refills | Status: DC | PRN

## 2018-03-05 MED ORDER — HYDROMORPHONE HCL 2 MG PO TABS: 2.0000 mg | ORAL_TABLET | ORAL | 0 refills | Status: AC | PRN

## 2018-03-05 MED ORDER — OMEPRAZOLE 40 MG PO CPDR: 40.0000 mg | DELAYED_RELEASE_CAPSULE | Freq: Every day | ORAL | 3 refills | Status: DC

## 2018-03-05 MED FILL — HYDROmorphone HCL 2 MG TABS: 2 | 9 days supply | Qty: 50 | Fill #0

## 2018-03-05 MED FILL — ALBUTEROL SULFATE HFA 108 (: 108 (90 BAS | 25 days supply | Qty: 18 | Fill #0

## 2018-03-05 MED FILL — OMEPRAZOLE DR 40 MG CAPSULE: 40 | 30 days supply | Qty: 30 | Fill #0

## 2018-03-05 NOTE — Progress Notes (Signed)
Subjective:  Patient ID: [name] [name], male    DOB: 01-05-1961  Age: 57 y.o. MRN: [id number]  CC: hospital f/u  HPI [name] [name] is a 57 y.o. male with a medical history of multiple myeloma, COPD, HTN, CHF, pulmonary hypertension, kidney stones, and acute on chronic respiratory failure presents as a new pt on hospital f/u. Admitted on 02/07/18 and discharged on 02/14/18 with diagnosis of HCAP and respiratory failure. Has not had pulmonology f/u. Does not have orange card or CAFA. Patient says his breathing is "a little better". Using Anoro Ellipta qday and Albuterol on occasion. Feeling "hot and cold" at times, last episode of temperature variances two days ago. Did not take his temperature during "hot and cold" episodes. Took his temp this morning and was 98.3 F. Has never had tuberculosis testing. Last pulmonology visit 09/17/17.    Has also had problems with acid reflux "for many years". Associated with epigastric pain. Long standing history of back pain, does not know if epigastric pain radiates to back. Occasional nausea without vomiting. Occasional chills.        Outpatient Medications Prior to Visit  Medication Sig Dispense Refill  . albuterol (PROVENTIL HFA;VENTOLIN HFA) 108 (90 Base) MCG/ACT inhaler Inhale 2 puffs into the lungs every 6 (six) hours as needed for wheezing or shortness of breath. 1 Inhaler 2  . nortriptyline (PAMELOR) 25 MG capsule Take 1 capsule (25 mg total) by mouth at bedtime. 30 capsule 3  . OXYGEN 2lpm BEDTIME ONLY    . tadalafil, PAH, (ADCIRCA) 20 MG tablet Take 2 tablets (40 mg total) by mouth daily. (Patient taking differently: Take 20 mg by mouth daily. ) 60 tablet 11  . umeclidinium-vilanterol (ANORO ELLIPTA) 62.5-25 MCG/INH AEPB Inhale 1 puff into the lungs daily. 60 each 5  . aspirin EC 325 MG EC tablet Take 1 tablet (325 mg total) by mouth daily. 30 tablet 3  . DM-Doxylamine-Acetaminophen (NYQUIL HBP COLD & FLU) 15-6.25-325 MG/15ML LIQD Take 15 mLs by mouth at  bedtime as needed (sleep).    . ondansetron (ZOFRAN) 8 MG tablet Take 1 tablet (8 mg total) by mouth 2 (two) times daily as needed (Nausea or vomiting). 30 tablet 1   Facility-Administered Medications Prior to Visit  Medication Dose Route Frequency Provider Last Rate Last Dose  . furosemide (LASIX) tablet 40 mg  40 mg Oral Once Perlov, Marinell Blight, MD         ROS Review of Systems  Constitutional: Positive for chills and fever. Negative for malaise/fatigue.  Eyes: Negative for blurred vision.  Respiratory: Negative for shortness of breath.   Cardiovascular: Negative for chest pain and palpitations.  Gastrointestinal: Positive for abdominal pain and nausea.  Genitourinary: Negative for dysuria and hematuria.  Musculoskeletal: Negative for joint pain and myalgias.  Skin: Negative for rash.  Neurological: Negative for tingling and headaches.  Endo/Heme/Allergies:       Temperature differences  Psychiatric/Behavioral: Negative for depression. The patient is not nervous/anxious.     Objective:  BP 101/69 (BP Location: Right Arm, Patient Position: Sitting, Cuff Size: Small)   Pulse (!) 106   Temp 98.2 F (36.8 C) (Oral)   Ht '5\' 1"'  (1.549 m)   Wt 86 lb 9.6 oz (39.3 kg)   SpO2 94%   BMI 16.36 kg/m   BP/Weight 03/05/2018 03/03/2018 4/46/2863  Systolic BP 817 711 85  Diastolic BP 69 81 53  Wt. (Lbs) 86.6 85.6 87.5  BMI 16.36 16.72 17.09  Physical Exam  Constitutional: He is oriented to person, place, and time.  Thin, NAD, polite  HENT:  Head: Normocephalic and atraumatic.  Mouth/Throat: No oropharyngeal exudate.  Eyes: No scleral icterus.  Neck: Normal range of motion. Neck supple. No thyromegaly present.  Cardiovascular: Normal rate and regular rhythm.  Murmur (2/6 systolic murmur) heard. Pulmonary/Chest: Effort normal. No respiratory distress. He has rales (coarse rales).  Abdominal: Soft. Bowel sounds are normal. There is tenderness (epigastric).  No hepatomegaly but  liver edge is somewhat firm  Musculoskeletal: He exhibits no edema or deformity.  Lymphadenopathy:    He has no cervical adenopathy.  Neurological: He is alert and oriented to person, place, and time.  Skin: Skin is warm and dry. No rash noted. No erythema. No pallor.  Psychiatric: He has a normal mood and affect. His behavior is normal. Thought content normal.  Vitals reviewed.    Assessment & Plan:   1. Hospital discharge follow-up - CBC with Differential - DG Chest 2 View; Future  2. Abnormal breath sounds - CBC with Differential - DG Chest 2 View; Future  3. Gastroesophageal reflux disease, esophagitis presence not specified - Begin omeprazole (PRILOSEC) 40 MG capsule; Take 1 capsule (40 mg total) by mouth daily.  Dispense: 30 capsule; Refill: 3  4. Abdominal pain, epigastric - CBC with Differential - Comprehensive metabolic panel - Lipase - H. pylori antibody, IgG  5. Medication refill - umeclidinium-vilanterol (ANORO ELLIPTA) 62.5-25 MCG/INH AEPB; Inhale 1 puff into the lungs daily.  Dispense: 60 each; Refill: 5   Meds ordered this encounter  Medications  . umeclidinium-vilanterol (ANORO ELLIPTA) 62.5-25 MCG/INH AEPB    Sig: Inhale 1 puff into the lungs daily.    Dispense:  60 each    Refill:  5    Order Specific Question:   Supervising Provider    Answer:   Tresa Garter [id number]  . omeprazole (PRILOSEC) 40 MG capsule    Sig: Take 1 capsule (40 mg total) by mouth daily.    Dispense:  30 capsule    Refill:  3    Order Specific Question:   Supervising Provider    Answer:   Tresa Garter [id number]  . albuterol (PROVENTIL HFA;VENTOLIN HFA) 108 (90 Base) MCG/ACT inhaler    Sig: Inhale 2 puffs into the lungs every 6 (six) hours as needed for wheezing or shortness of breath.    Dispense:  1 Inhaler    Refill:  5    Order Specific Question:   Supervising Provider    Answer:   Tresa Garter [[id number]]    Follow-up: Return in about 6 weeks  (around 04/16/2018) for GERD and annual exam.   [name] PA

## 2018-03-05 NOTE — Patient Instructions (Signed)
Food Choices for Gastroesophageal Reflux Disease, Adult When you have gastroesophageal reflux disease (GERD), the foods you eat and your eating habits are very important. Choosing the right foods can help ease your discomfort. What guidelines do I need to follow?  Choose fruits, vegetables, whole grains, and low-fat dairy products.  Choose low-fat meat, fish, and poultry.  Limit fats such as oils, salad dressings, butter, nuts, and avocado.  Keep a food diary. This helps you identify foods that cause symptoms.  Avoid foods that cause symptoms. These may be different for everyone.  Eat small meals often instead of 3 large meals a day.  Eat your meals slowly, in a place where you are relaxed.  Limit fried foods.  Cook foods using methods other than frying.  Avoid drinking alcohol.  Avoid drinking large amounts of liquids with your meals.  Avoid bending over or lying down until 2-3 hours after eating. What foods are not recommended? These are some foods and drinks that may make your symptoms worse: Vegetables  Tomatoes. Tomato juice. Tomato and spaghetti sauce. Chili peppers. Onion and garlic. Horseradish. Fruits  Oranges, grapefruit, and lemon (fruit and juice). Meats  High-fat meats, fish, and poultry. This includes hot dogs, ribs, ham, sausage, salami, and bacon. Dairy  Whole milk and chocolate milk. Sour cream. Cream. Butter. Ice cream. Cream cheese. Drinks  Coffee and tea. Bubbly (carbonated) drinks or energy drinks. Condiments  Hot sauce. Barbecue sauce. Sweets/Desserts  Chocolate and cocoa. Donuts. Peppermint and spearmint. Fats and Oils  High-fat foods. This includes French fries and potato chips. Other  Vinegar. Strong spices. This includes black pepper, white pepper, red pepper, cayenne, curry powder, cloves, ginger, and chili powder. The items listed above may not be a complete list of foods and drinks to avoid. Contact your dietitian for more information.    This information is not intended to replace advice given to you by your health care provider. Make sure you discuss any questions you have with your health care provider. Document Released: 04/21/2012 Document Revised: 03/28/2016 Document Reviewed: 08/25/2013 Elsevier Interactive Patient Education  2017 Elsevier Inc.  

## 2018-03-06 ENCOUNTER — Other Ambulatory Visit (INDEPENDENT_AMBULATORY_CARE_PROVIDER_SITE_OTHER): Payer: Self-pay | Admitting: Physician Assistant

## 2018-03-06 DIAGNOSIS — R918 Other nonspecific abnormal finding of lung field: Secondary | ICD-10-CM

## 2018-03-06 DIAGNOSIS — D649 Anemia, unspecified: Secondary | ICD-10-CM

## 2018-03-06 LAB — COMPREHENSIVE METABOLIC PANEL
ALBUMIN: 3 g/dL — AB (ref 3.5–5.5)
ALT: 11 IU/L (ref 0–44)
AST: 19 IU/L (ref 0–40)
Albumin/Globulin Ratio: 0.8 — ABNORMAL LOW (ref 1.2–2.2)
Alkaline Phosphatase: 85 IU/L (ref 39–117)
BUN/Creatinine Ratio: 11 (ref 9–20)
BUN: 7 mg/dL (ref 6–24)
Bilirubin Total: 0.3 mg/dL (ref 0.0–1.2)
CALCIUM: 8.1 mg/dL — AB (ref 8.7–10.2)
CO2: 24 mmol/L (ref 20–29)
CREATININE: 0.63 mg/dL — AB (ref 0.76–1.27)
Chloride: 96 mmol/L (ref 96–106)
GFR calc Af Amer: 128 mL/min/{1.73_m2} (ref >59)
GFR, EST NON AFRICAN AMERICAN: 111 mL/min/{1.73_m2} (ref >59)
GLOBULIN, TOTAL: 3.8 g/dL (ref 1.5–4.5)
Glucose: 123 mg/dL — ABNORMAL HIGH (ref 65–99)
Potassium: 3.9 mmol/L (ref 3.5–5.2)
SODIUM: 134 mmol/L (ref 134–144)
Total Protein: 6.8 g/dL (ref 6.0–8.5)

## 2018-03-06 LAB — CBC WITH DIFFERENTIAL/PLATELET
Basophils Absolute: 0.1 10*3/uL (ref 0.0–0.2)
Basos: 1 %
EOS (ABSOLUTE): 0.4 10*3/uL (ref 0.0–0.4)
EOS: 4 %
HEMATOCRIT: 32.3 % — AB (ref 37.5–51.0)
HEMOGLOBIN: 9.5 g/dL — AB (ref 13.0–17.7)
IMMATURE GRANULOCYTES: 0 %
Immature Grans (Abs): 0 10*3/uL (ref 0.0–0.1)
Lymphocytes Absolute: 1.5 10*3/uL (ref 0.7–3.1)
Lymphs: 16 %
MCH: 24.1 pg — ABNORMAL LOW (ref 26.6–33.0)
MCHC: 29.4 g/dL — ABNORMAL LOW (ref 31.5–35.7)
MCV: 82 fL (ref 79–97)
MONOCYTES: 15 %
Monocytes Absolute: 1.4 10*3/uL — ABNORMAL HIGH (ref 0.1–0.9)
NEUTROS PCT: 64 %
Neutrophils Absolute: 6 10*3/uL (ref 1.4–7.0)
Platelets: 474 10*3/uL — ABNORMAL HIGH (ref 150–379)
RBC: 3.95 x10E6/uL — AB (ref 4.14–5.80)
RDW: 20.5 % — ABNORMAL HIGH (ref 12.3–15.4)
WBC: 9.4 10*3/uL (ref 3.4–10.8)

## 2018-03-06 LAB — H. PYLORI ANTIBODY, IGG

## 2018-03-06 LAB — LIPASE: Lipase: 21 U/L (ref 13–78)

## 2018-03-06 MED ORDER — FERROUS SULFATE 325 (65 FE) MG PO TBEC: 325.0000 mg | DELAYED_RELEASE_TABLET | Freq: Three times a day (TID) | ORAL | 0 refills | Status: DC

## 2018-03-06 MED ORDER — LEVOFLOXACIN 750 MG PO TABS: 750.0000 mg | ORAL_TABLET | Freq: Every day | ORAL | 0 refills | Status: AC

## 2018-03-06 MED ORDER — DOCUSATE SODIUM 50 MG PO CAPS: 50.0000 mg | ORAL_CAPSULE | Freq: Every day | ORAL | 0 refills | Status: DC

## 2018-03-06 MED FILL — levoFLOXacin 750 MG TABS: 750 | 5 days supply | Qty: 5 | Fill #0

## 2018-03-09 ENCOUNTER — Other Ambulatory Visit (INDEPENDENT_AMBULATORY_CARE_PROVIDER_SITE_OTHER): Payer: Self-pay

## 2018-03-09 VITALS — Temp 98.2°F

## 2018-03-09 DIAGNOSIS — Z111 Encounter for screening for respiratory tuberculosis: Secondary | ICD-10-CM

## 2018-03-09 NOTE — Progress Notes (Signed)
PPD placed

## 2018-03-11 ENCOUNTER — Other Ambulatory Visit (INDEPENDENT_AMBULATORY_CARE_PROVIDER_SITE_OTHER): Payer: Self-pay

## 2018-03-11 LAB — TB SKIN TEST: TB Skin Test: NEGATIVE

## 2018-03-12 ENCOUNTER — Inpatient Hospital Stay: Payer: Self-pay | Attending: Hematology

## 2018-03-12 DIAGNOSIS — B029 Zoster without complications: Secondary | ICD-10-CM | POA: Insufficient documentation

## 2018-03-12 DIAGNOSIS — J869 Pyothorax without fistula: Secondary | ICD-10-CM | POA: Insufficient documentation

## 2018-03-12 DIAGNOSIS — D649 Anemia, unspecified: Secondary | ICD-10-CM | POA: Insufficient documentation

## 2018-03-12 DIAGNOSIS — C9000 Multiple myeloma not having achieved remission: Secondary | ICD-10-CM | POA: Insufficient documentation

## 2018-03-12 DIAGNOSIS — Z5112 Encounter for antineoplastic immunotherapy: Secondary | ICD-10-CM | POA: Insufficient documentation

## 2018-03-12 DIAGNOSIS — J449 Chronic obstructive pulmonary disease, unspecified: Secondary | ICD-10-CM | POA: Insufficient documentation

## 2018-03-12 DIAGNOSIS — I272 Pulmonary hypertension, unspecified: Secondary | ICD-10-CM | POA: Insufficient documentation

## 2018-03-12 LAB — CBC WITH DIFFERENTIAL (CANCER CENTER ONLY)
Basophils Absolute: 0.1 10*3/uL (ref 0.0–0.1)
Basophils Relative: 1 %
EOS ABS: 0.1 10*3/uL (ref 0.0–0.5)
Eosinophils Relative: 1 %
HCT: 34.7 % — ABNORMAL LOW (ref 38.4–49.9)
Hemoglobin: 11.1 g/dL — ABNORMAL LOW (ref 13.0–17.1)
LYMPHS ABS: 1.6 10*3/uL (ref 0.9–3.3)
LYMPHS PCT: 15 %
MCH: 24.6 pg — AB (ref 27.2–33.4)
MCHC: 32 g/dL (ref 32.0–36.0)
MCV: 76.8 fL — AB (ref 79.3–98.0)
MONO ABS: 0.7 10*3/uL (ref 0.1–0.9)
Monocytes Relative: 7 %
Neutro Abs: 7.8 10*3/uL — ABNORMAL HIGH (ref 1.5–6.5)
Neutrophils Relative %: 76 %
PLATELETS: 535 10*3/uL — AB (ref 140–400)
RBC: 4.52 MIL/uL (ref 4.20–5.82)
RDW: 19.2 % — AB (ref 11.0–14.6)
WBC Count: 10.1 10*3/uL (ref 4.0–10.3)

## 2018-03-12 LAB — LACTATE DEHYDROGENASE: LDH: 214 U/L (ref 125–245)

## 2018-03-12 LAB — CMP (CANCER CENTER ONLY)
ALBUMIN: 2.9 g/dL — AB (ref 3.5–5.0)
ALT: 12 U/L (ref 0–55)
AST: 21 U/L (ref 5–34)
Alkaline Phosphatase: 112 U/L (ref 40–150)
Anion gap: 8 (ref 3–11)
BUN: 5 mg/dL — ABNORMAL LOW (ref 7–26)
CHLORIDE: 98 mmol/L (ref 98–109)
CO2: 29 mmol/L (ref 22–29)
CREATININE: 0.79 mg/dL (ref 0.70–1.30)
Calcium: 9.3 mg/dL (ref 8.4–10.4)
GFR, Estimated: >60 mL/min (ref >60)
GLUCOSE: 105 mg/dL (ref 70–140)
POTASSIUM: 3.7 mmol/L (ref 3.5–5.1)
SODIUM: 135 mmol/L — AB (ref 136–145)
TOTAL PROTEIN: 9.3 g/dL — AB (ref 6.4–8.3)
Total Bilirubin: 0.2 mg/dL (ref 0.2–1.2)

## 2018-03-13 ENCOUNTER — Telehealth (INDEPENDENT_AMBULATORY_CARE_PROVIDER_SITE_OTHER): Payer: Self-pay

## 2018-03-13 LAB — BETA 2 MICROGLOBULIN, SERUM: BETA 2 MICROGLOBULIN: 1.8 mg/L (ref 0.6–2.4)

## 2018-03-13 LAB — KAPPA/LAMBDA LIGHT CHAINS
KAPPA, LAMDA LIGHT CHAIN RATIO: 1.78 — AB (ref 0.26–1.65)
Kappa free light chain: 59.9 mg/L — ABNORMAL HIGH (ref 3.3–19.4)
Lambda free light chains: 33.6 mg/L — ABNORMAL HIGH (ref 5.7–26.3)

## 2018-03-13 NOTE — Telephone Encounter (Signed)
Voicemail full, will call back. Nat Christen, CMA

## 2018-03-16 ENCOUNTER — Telehealth: Payer: Self-pay | Admitting: Hematology and Oncology

## 2018-03-16 ENCOUNTER — Inpatient Hospital Stay: Payer: Self-pay

## 2018-03-16 ENCOUNTER — Inpatient Hospital Stay (HOSPITAL_BASED_OUTPATIENT_CLINIC_OR_DEPARTMENT_OTHER): Payer: Self-pay | Admitting: Hematology and Oncology

## 2018-03-16 ENCOUNTER — Other Ambulatory Visit: Payer: Self-pay

## 2018-03-16 ENCOUNTER — Encounter: Payer: Self-pay | Admitting: Hematology and Oncology

## 2018-03-16 VITALS — BP 122/85 | HR 97 | Temp 97.9°F | Resp 18 | Ht 61.0 in | Wt 85.3 lb

## 2018-03-16 DIAGNOSIS — J449 Chronic obstructive pulmonary disease, unspecified: Secondary | ICD-10-CM

## 2018-03-16 DIAGNOSIS — J869 Pyothorax without fistula: Secondary | ICD-10-CM

## 2018-03-16 DIAGNOSIS — C9000 Multiple myeloma not having achieved remission: Secondary | ICD-10-CM

## 2018-03-16 DIAGNOSIS — Z7189 Other specified counseling: Secondary | ICD-10-CM

## 2018-03-16 DIAGNOSIS — B029 Zoster without complications: Secondary | ICD-10-CM

## 2018-03-16 DIAGNOSIS — I272 Pulmonary hypertension, unspecified: Secondary | ICD-10-CM

## 2018-03-16 DIAGNOSIS — K219 Gastro-esophageal reflux disease without esophagitis: Secondary | ICD-10-CM

## 2018-03-16 DIAGNOSIS — D649 Anemia, unspecified: Secondary | ICD-10-CM

## 2018-03-16 MED ORDER — DEXAMETHASONE 4 MG PO TABS: 20.0000 mg | ORAL_TABLET | ORAL | 2 refills | Status: AC

## 2018-03-16 MED ORDER — PROCHLORPERAZINE MALEATE 10 MG PO TABS
10.0000 mg | ORAL_TABLET | Freq: Once | ORAL | Status: AC
  Administered 2018-03-16: 10 mg via ORAL

## 2018-03-16 MED ORDER — PROCHLORPERAZINE MALEATE 10 MG PO TABS
10.0000 mg | ORAL_TABLET | Freq: Once | ORAL | Status: DC
Start: 2018-03-16 — End: 2018-03-16

## 2018-03-16 MED ORDER — ZOLEDRONIC ACID 4 MG/5ML IV CONC
3.5000 mg | Freq: Once | INTRAVENOUS | Status: AC
  Administered 2018-03-16: 3.5 mg via INTRAVENOUS
  Filled 2018-03-16: qty 4.38

## 2018-03-16 MED ORDER — FERROUS SULFATE 325 (65 FE) MG PO TBEC
325.0000 mg | DELAYED_RELEASE_TABLET | Freq: Three times a day (TID) | ORAL | 0 refills | Status: DC
Start: 2018-03-16 — End: 2018-05-13

## 2018-03-16 MED ORDER — SODIUM CHLORIDE 0.9 % IV SOLN
Freq: Once | INTRAVENOUS | Status: AC
  Administered 2018-03-16: 10:00:00 via INTRAVENOUS

## 2018-03-16 MED ORDER — BORTEZOMIB CHEMO IV INJECTION 3.5 MG: 1.3000 mg/m2 | Freq: Once | INTRAMUSCULAR | Status: DC

## 2018-03-16 MED ORDER — NORTRIPTYLINE HCL 25 MG PO CAPS: 25.0000 mg | ORAL_CAPSULE | Freq: Every day | ORAL | 3 refills | Status: DC

## 2018-03-16 MED ORDER — OMEPRAZOLE 40 MG PO CPDR: 40.0000 mg | DELAYED_RELEASE_CAPSULE | Freq: Every day | ORAL | 3 refills | Status: DC

## 2018-03-16 MED ORDER — PROCHLORPERAZINE MALEATE 10 MG PO TABS
ORAL_TABLET | ORAL | Status: AC
  Filled 2018-03-16: qty 1

## 2018-03-16 MED ORDER — ACYCLOVIR 400 MG PO TABS: 400.0000 mg | ORAL_TABLET | Freq: Two times a day (BID) | ORAL | 3 refills | Status: AC

## 2018-03-16 MED ORDER — BORTEZOMIB CHEMO SQ INJECTION 3.5 MG (2.5MG/ML)
1.3000 mg/m2 | Freq: Once | INTRAMUSCULAR | Status: AC
  Administered 2018-03-16: 1.75 mg via SUBCUTANEOUS
  Filled 2018-03-16: qty 1.75

## 2018-03-16 MED FILL — NORTRIPTYLINE HCL 25 MG CAP: 25 | 30 days supply | Qty: 30 | Fill #0

## 2018-03-16 MED FILL — ACYCLOVIR 400 MG TABLET: 400 | 30 days supply | Qty: 60 | Fill #0

## 2018-03-16 MED FILL — DEXAMETHASONE 4 MG TABLET: 4 | 28 days supply | Qty: 20 | Fill #0

## 2018-03-16 NOTE — Patient Instructions (Addendum)
[facility] Discharge Instructions for Patients Receiving Chemotherapy  Today you received the following chemotherapy agents: Bortezomib (Velcade).  To help prevent nausea and vomiting after your treatment, we encourage you to take your nausea medication as prescribed.  If you develop nausea and vomiting that is not controlled by your nausea medication, call the clinic.   BELOW ARE SYMPTOMS THAT SHOULD BE REPORTED IMMEDIATELY:  *FEVER GREATER THAN 100.5 F  *CHILLS WITH OR WITHOUT FEVER  NAUSEA AND VOMITING THAT IS NOT CONTROLLED WITH YOUR NAUSEA MEDICATION  *UNUSUAL SHORTNESS OF BREATH  *UNUSUAL BRUISING OR BLEEDING  TENDERNESS IN MOUTH AND THROAT WITH OR WITHOUT PRESENCE OF ULCERS  *URINARY PROBLEMS  *BOWEL PROBLEMS  UNUSUAL RASH Items with * indicate a potential emergency and should be followed up as soon as possible.  Feel free to call the clinic should you have any questions or concerns. The clinic phone number is [phone].  Please show the [facility] at check-in to the Emergency Department and triage nurse.  Zoledronic Acid injection (Hypercalcemia, Oncology) What is this medicine? ZOLEDRONIC ACID (ZOE le dron ik AS id) lowers the amount of calcium loss from bone. It is used to treat too much calcium in your blood from cancer. It is also used to prevent complications of cancer that has spread to the bone. This medicine may be used for other purposes; ask your health care provider or pharmacist if you have questions. COMMON BRAND NAME(S): Zometa What should I tell my health care provider before I take this medicine? They need to know if you have any of these conditions: -aspirin-sensitive asthma -cancer, especially if you are receiving medicines used to treat cancer -dental disease or wear dentures -infection -kidney disease -receiving corticosteroids like dexamethasone or prednisone -an unusual or allergic reaction to zoledronic acid, other  medicines, foods, dyes, or preservatives -pregnant or trying to get pregnant -breast-feeding How should I use this medicine? This medicine is for infusion into a vein. It is given by a health care professional in a hospital or clinic setting. Talk to your pediatrician regarding the use of this medicine in children. Special care may be needed. Overdosage: If you think you have taken too much of this medicine contact a poison control center or emergency room at once. NOTE: This medicine is only for you. Do not share this medicine with others. What if I miss a dose? It is important not to miss your dose. Call your doctor or health care professional if you are unable to keep an appointment. What may interact with this medicine? -certain antibiotics given by injection -NSAIDs, medicines for pain and inflammation, like ibuprofen or naproxen -some diuretics like bumetanide, furosemide -teriparatide -thalidomide This list may not describe all possible interactions. Give your health care provider a list of all the medicines, herbs, non-prescription drugs, or dietary supplements you use. Also tell them if you smoke, drink alcohol, or use illegal drugs. Some items may interact with your medicine. What should I watch for while using this medicine? Visit your doctor or health care professional for regular checkups. It may be some time before you see the benefit from this medicine. Do not stop taking your medicine unless your doctor tells you to. Your doctor may order blood tests or other tests to see how you are doing. Women should inform their doctor if they wish to become pregnant or think they might be pregnant. There is a potential for serious side effects to an unborn child. Talk to  your health care professional or pharmacist for more information. You should make sure that you get enough calcium and vitamin D while you are taking this medicine. Discuss the foods you eat and the vitamins you take with  your health care professional. Some people who take this medicine have severe bone, joint, and/or muscle pain. This medicine may also increase your risk for jaw problems or a broken thigh bone. Tell your doctor right away if you have severe pain in your jaw, bones, joints, or muscles. Tell your doctor if you have any pain that does not go away or that gets worse. Tell your dentist and dental surgeon that you are taking this medicine. You should not have major dental surgery while on this medicine. See your dentist to have a dental exam and fix any dental problems before starting this medicine. Take good care of your teeth while on this medicine. Make sure you see your dentist for regular follow-up appointments. What side effects may I notice from receiving this medicine? Side effects that you should report to your doctor or health care professional as soon as possible: -allergic reactions like skin rash, itching or hives, swelling of the face, lips, or tongue -anxiety, confusion, or depression -breathing problems -changes in vision -eye pain -feeling faint or lightheaded, falls -jaw pain, especially after dental work -mouth sores -muscle cramps, stiffness, or weakness -redness, blistering, peeling or loosening of the skin, including inside the mouth -trouble passing urine or change in the amount of urine Side effects that usually do not require medical attention (report to your doctor or health care professional if they continue or are bothersome): -bone, joint, or muscle pain -constipation -diarrhea -fever -hair loss -irritation at site where injected -loss of appetite -nausea, vomiting -stomach upset -trouble sleeping -trouble swallowing -weak or tired This list may not describe all possible side effects. Call your doctor for medical advice about side effects. You may report side effects to FDA at 1-800-FDA-1088. Where should I keep my medicine? This drug is given in a hospital or  clinic and will not be stored at home. NOTE: This sheet is a summary. It may not cover all possible information. If you have questions about this medicine, talk to your doctor, pharmacist, or health care provider.  2018 Elsevier/Gold Standard (2014-03-19 14:19:39)

## 2018-03-16 NOTE — Telephone Encounter (Signed)
Appointments scheduled AVS/Calendar printed per 5/13 los °

## 2018-03-17 ENCOUNTER — Other Ambulatory Visit: Payer: Self-pay | Admitting: Radiology

## 2018-03-17 LAB — MULTIPLE MYELOMA PANEL, SERUM
ALBUMIN/GLOB SERPL: 0.5 — AB (ref 0.7–1.7)
ALPHA 1: 0.5 g/dL — AB (ref 0.0–0.4)
Albumin SerPl Elph-Mcnc: 2.5 g/dL — ABNORMAL LOW (ref 2.9–4.4)
Alpha2 Glob SerPl Elph-Mcnc: 1.1 g/dL — ABNORMAL HIGH (ref 0.4–1.0)
B-GLOBULIN SERPL ELPH-MCNC: 1 g/dL (ref 0.7–1.3)
GAMMA GLOB SERPL ELPH-MCNC: 2.6 g/dL — AB (ref 0.4–1.8)
GLOBULIN, TOTAL: 5.2 g/dL — AB (ref 2.2–3.9)
IGG (IMMUNOGLOBIN G), SERUM: 2787 mg/dL — AB (ref 700–1600)
IgA: 182 mg/dL (ref 90–386)
IgM (Immunoglobulin M), Srm: 41 mg/dL (ref 20–172)
M PROTEIN SERPL ELPH-MCNC: 1.5 g/dL — AB
Total Protein ELP: 7.7 g/dL (ref 6.0–8.5)

## 2018-03-18 ENCOUNTER — Encounter (HOSPITAL_COMMUNITY): Payer: Self-pay

## 2018-03-18 ENCOUNTER — Ambulatory Visit (HOSPITAL_COMMUNITY)
Admission: RE | Admit: 2018-03-18 | Discharge: 2018-03-18 | Disposition: A | Discharge status: Home / Self Care | Payer: Self-pay | Source: Ambulatory Visit | Attending: Hematology and Oncology | Admitting: Hematology and Oncology

## 2018-03-18 DIAGNOSIS — I10 Essential (primary) hypertension: Secondary | ICD-10-CM | POA: Insufficient documentation

## 2018-03-18 DIAGNOSIS — D7589 Other specified diseases of blood and blood-forming organs: Secondary | ICD-10-CM | POA: Insufficient documentation

## 2018-03-18 DIAGNOSIS — J449 Chronic obstructive pulmonary disease, unspecified: Secondary | ICD-10-CM | POA: Insufficient documentation

## 2018-03-18 DIAGNOSIS — C9000 Multiple myeloma not having achieved remission: Secondary | ICD-10-CM | POA: Insufficient documentation

## 2018-03-18 DIAGNOSIS — D72829 Elevated white blood cell count, unspecified: Secondary | ICD-10-CM | POA: Insufficient documentation

## 2018-03-18 DIAGNOSIS — D72822 Plasmacytosis: Secondary | ICD-10-CM | POA: Insufficient documentation

## 2018-03-18 DIAGNOSIS — R7989 Other specified abnormal findings of blood chemistry: Secondary | ICD-10-CM | POA: Insufficient documentation

## 2018-03-18 LAB — CBC WITH DIFFERENTIAL/PLATELET
Basophils Absolute: 0 10*3/uL (ref 0.0–0.1)
Basophils Relative: 0 %
EOS PCT: 2 %
Eosinophils Absolute: 0.2 10*3/uL (ref 0.0–0.7)
HEMATOCRIT: 35.9 % — AB (ref 39.0–52.0)
Hemoglobin: 11.5 g/dL — ABNORMAL LOW (ref 13.0–17.0)
Lymphocytes Relative: 17 %
Lymphs Abs: 1.8 10*3/uL (ref 0.7–4.0)
MCH: 24.4 pg — ABNORMAL LOW (ref 26.0–34.0)
MCHC: 32 g/dL (ref 30.0–36.0)
MCV: 76.1 fL — AB (ref 78.0–100.0)
MONO ABS: 1.3 10*3/uL — AB (ref 0.1–1.0)
Monocytes Relative: 13 %
Neutro Abs: 7.2 10*3/uL (ref 1.7–7.7)
Neutrophils Relative %: 68 %
Platelets: 626 10*3/uL — ABNORMAL HIGH (ref 150–400)
RBC: 4.72 MIL/uL (ref 4.22–5.81)
RDW: 18.8 % — AB (ref 11.5–15.5)
WBC: 10.6 10*3/uL — ABNORMAL HIGH (ref 4.0–10.5)

## 2018-03-18 LAB — PROTIME-INR
INR: 1.02
Prothrombin Time: 13.3 seconds (ref 11.4–15.2)

## 2018-03-18 MED ORDER — MIDAZOLAM HCL 2 MG/2ML IJ SOLN
INTRAMUSCULAR | Status: AC | PRN
  Administered 2018-03-18 (×4): 0.5 mg via INTRAVENOUS

## 2018-03-18 MED ORDER — MIDAZOLAM HCL 2 MG/2ML IJ SOLN
INTRAMUSCULAR | Status: AC
  Filled 2018-03-18: qty 4

## 2018-03-18 MED ORDER — LIDOCAINE HCL 1 % IJ SOLN
INTRAMUSCULAR | Status: AC | PRN
  Administered 2018-03-18: 10 mL via INTRADERMAL

## 2018-03-18 MED ORDER — HYDROCODONE-ACETAMINOPHEN 5-325 MG PO TABS: 1.0000 | ORAL_TABLET | ORAL | Status: DC | PRN

## 2018-03-18 MED ORDER — FENTANYL CITRATE (PF) 100 MCG/2ML IJ SOLN
INTRAMUSCULAR | Status: AC | PRN
  Administered 2018-03-18: 50 ug via INTRAVENOUS
  Administered 2018-03-18 (×2): 25 ug via INTRAVENOUS

## 2018-03-18 MED ORDER — SODIUM CHLORIDE 0.9 % IV SOLN
INTRAVENOUS | Status: DC
  Administered 2018-03-18: 08:00:00 via INTRAVENOUS

## 2018-03-18 MED ORDER — FENTANYL CITRATE (PF) 100 MCG/2ML IJ SOLN
INTRAMUSCULAR | Status: DC
Start: 2018-03-18 — End: 2018-03-18
  Filled 2018-03-18: qty 4

## 2018-03-18 NOTE — Procedures (Addendum)
  Procedure: CT bone marrow biopsy R iliac EBL:   minimal Complications:  none immediate  See full dictation in [facility].  Dillard Cannon MD Main # [phone] Pager  [phone]

## 2018-03-18 NOTE — Progress Notes (Signed)
Patient checked in for IR procedure using interpreter Y Hin Steva Colder with no issues noted.  Patient understands some english however needs interpreter.

## 2018-03-18 NOTE — H&P (Signed)
Chief Complaint: Patient was seen in consultation today for multiple myeloma  Referring Physician(s): Perlov,Mikhail G  Supervising Physician: Arne Cleveland  Patient Status: [name] [name]  History of Present Illness: [name] [name] is a 57 y.o. male with HTN, COPD, and multiple myeloma who currently undergoing treatment.  He presents for bone marrow biopsy at the request of Dr. Lebron Conners.  He has been NPO.  He does not take blood thinners.   Past Medical History:  Diagnosis Date  . Acute on chronic respiratory failure ([place]) 02/07/2018  . Hypertension   . Kidney stones   . Pulmonary hypertension ([place])     Past Surgical History:  Procedure Laterality Date  . CARDIAC CATHETERIZATION N/A 01/18/2016   Procedure: Right Heart Cath;  Surgeon: Jolaine Artist, MD;  Location: [facility];  Service: Cardiovascular;  Laterality: N/A;  . CARDIAC CATHETERIZATION N/A 06/24/2016   Procedure: Right Heart Cath;  Surgeon: Jolaine Artist, MD;  Location: [facility];  Service: Cardiovascular;  Laterality: N/A;  . KIDNEY SURGERY    . VIDEO ASSISTED THORACOSCOPY (VATS)/DECORTICATION Left 2003  . VIDEO ASSISTED THORACOSCOPY (VATS)/DECORTICATION Left 10/02/2015   Procedure: VIDEO ASSISTED THORACOSCOPY (VATS)/DECORTICATION and drainage of chronic empyena;  Surgeon: [name], MD;  Location: [facility];  Service: Thoracic;  Laterality: Left;  Marland Kitchen VIDEO BRONCHOSCOPY N/A 10/02/2015   Procedure: VIDEO BRONCHOSCOPY;  Surgeon: [name], MD;  Location: [facility];  Service: Thoracic;  Laterality: N/A;    Allergies: Patient has no known allergies.  Medications: Prior to Admission medications   Medication Sig Start Date End Date Taking? Authorizing Provider  acyclovir (ZOVIRAX) 400 MG tablet Take 1 tablet (400 mg total) by mouth 2 (two) times daily. 03/16/18 04/15/18 Yes Perlov, Marinell Blight, MD  albuterol (PROVENTIL HFA;VENTOLIN HFA) 108 (90 Base) MCG/ACT inhaler Inhale 2 puffs into the  lungs every 6 (six) hours as needed for wheezing or shortness of breath. 03/05/18  Yes [name], PA-C  dexamethasone (DECADRON) 4 MG tablet Take 5 tablets (20 mg total) by mouth once a week for 12 doses. 03/16/18 06/02/18 Yes Perlov, Marinell Blight, MD  docusate sodium (COLACE) 50 MG capsule Take 1 capsule (50 mg total) by mouth daily. 03/06/18  Yes [name], PA-C  ferrous sulfate 325 (65 FE) MG EC tablet Take 1 tablet (325 mg total) by mouth 3 (three) times daily with meals. 03/16/18  Yes Perlov, Marinell Blight, MD  HYDROmorphone (DILAUDID) 2 MG tablet Take 1 tablet (2 mg total) by mouth every 4 (four) hours as needed for up to 14 days for severe pain. 03/05/18 03/19/18 Yes Ardath Sax, MD  nortriptyline (PAMELOR) 25 MG capsule Take 1 capsule (25 mg total) by mouth at bedtime. 03/16/18 04/15/18 Yes Perlov, Marinell Blight, MD  omeprazole (PRILOSEC) 40 MG capsule Take 1 capsule (40 mg total) by mouth daily. 03/16/18  Yes Perlov, Marinell Blight, MD  OXYGEN 2lpm BEDTIME ONLY   Yes [provider]  umeclidinium-vilanterol (ANORO ELLIPTA) 62.5-25 MCG/INH AEPB Inhale 1 puff into the lungs daily. 03/05/18  Yes [name], PA-C  tadalafil, PAH, (ADCIRCA) 20 MG tablet Take 2 tablets (40 mg total) by mouth daily. Patient taking differently: Take 20 mg by mouth daily.  08/26/17   Juanito Doom, MD     Family History  Problem Relation Age of Onset  . Hypertension Other     Social History   Socioeconomic History  . Marital status: Single  Spouse name: Not on file  . Number of children: Not on file  . Years of education: Not on file  . Highest education level: Not on file  Occupational History  . Not on file  Social Needs  . Financial resource strain: Not on file  . Food insecurity:    Worry: Not on file    Inability: Not on file  . Transportation needs:    Medical: Not on file    Non-medical: Not on file  Tobacco Use  . Smoking status: Never Smoker  . Smokeless tobacco: Never  Used  Substance and Sexual Activity  . Alcohol use: Yes  . Drug use: No  . Sexual activity: Not on file  Lifestyle  . Physical activity:    Days per week: Not on file    Minutes per session: Not on file  . Stress: Not on file  Relationships  . Social connections:    Talks on phone: Not on file    Gets together: Not on file    Attends religious service: Not on file    Active member of club or organization: Not on file    Attends meetings of clubs or organizations: Not on file    Relationship status: Not on file  Other Topics Concern  . Not on file  Social History Narrative  . Not on file     Review of Systems: A 12 point ROS discussed and pertinent positives are indicated in the HPI above.  All other systems are negative.  Review of Systems  Constitutional: Negative for fatigue and fever.  Respiratory: Positive for cough (chronic). Negative for shortness of breath.   Cardiovascular: Negative for chest pain.  Gastrointestinal: Negative for abdominal pain, nausea and vomiting.  Musculoskeletal: Negative for back pain.  Psychiatric/Behavioral: Negative for behavioral problems and confusion.    Vital Signs: BP 125/80 (BP Location: Right Arm)   Pulse 89   Temp 97.9 F (36.6 C) (Oral)   Resp 18   SpO2 91%   Physical Exam  Constitutional: He is oriented to person, place, and time. He appears well-developed.  Cardiovascular: Normal rate, regular rhythm and normal heart sounds.  Pulmonary/Chest: Effort normal and breath sounds normal. No respiratory distress.  Abdominal: Soft.  Neurological: He is alert and oriented to person, place, and time.  Skin: Skin is warm and dry.  Psychiatric: He has a normal mood and affect. His behavior is normal. Judgment and thought content normal.  Nursing note and vitals reviewed.    MD Evaluation Airway: WNL Heart: WNL Abdomen: WNL Chest/ Lungs: WNL ASA  Classification: 3 Mallampati/Airway Score: One   Imaging: Dg Chest 2  View  Result Date: 03/05/2018 CLINICAL DATA:  Abnormal breath sounds EXAM: CHEST - 2 VIEW COMPARISON:  02/12/2018, 02/07/2018, 06/12/2017, CT chest 02/07/2018 FINDINGS: Moderate left pleural disease, similar compared to prior, with calcified pleural plaques. Clearing of previously noted infiltrates in the right upper and lower lung zones. No new abnormality. Tiny right pleural effusion or thickening. Stable cardiomediastinal silhouette with aortic atherosclerosis. No pneumothorax. IMPRESSION: 1. Significant clearing of previously noted right upper and lower lung infiltrates compared to prior radiograph with minimal residual in the right lower lobe. 2. No significant interval change in the chronic left pleural process and consolidation at the lingula and left base. Electronically Signed   By: Donavan Foil M.D.   On: 03/05/2018 22:15    Labs:  CBC: Recent Labs    03/03/18 0944 03/05/18 1206 03/12/18 1108 03/18/18 9935  WBC 7.9 9.4 10.1 10.6*  HGB 10.7* 9.5* 11.1* 11.5*  HCT 33.7* 32.3* 34.7* 35.9*  PLT 422* 474* 535* 626*    COAGS: Recent Labs    11/03/17 0728 01/02/18 0705 01/26/18 1735 03/18/18 0728  INR 1.05 1.00 1.18 1.02  APTT 32  --  36  --     BMP: Recent Labs    02/16/18 1409 03/03/18 0944 03/05/18 1206 03/12/18 1108  NA 129* 132* 134 135*  K 4.4 3.7 3.9 3.7  CL 92* 98 96 98  CO2 30* '27 24 29  ' GLUCOSE 170* 194* 123* 105  BUN '11 9 7 ' 5*  CALCIUM 8.6 8.6 8.1* 9.3  CREATININE 0.67* 0.73 0.63* 0.79  GFRNONAA >60 >60 111 >60  GFRAA >60 >60 128 >60    LIVER FUNCTION TESTS: Recent Labs    02/16/18 1409 03/03/18 0944 03/05/18 1206 03/12/18 1108  BILITOT 0.3 0.4 0.3 0.2  AST '15 18 19 21  ' ALT '15 10 11 12  ' ALKPHOS 69 73 85 112  PROT 7.2 7.3 6.8 9.3*  ALBUMIN 2.4* 2.5* 3.0* 2.9*    TUMOR MARKERS: No results for input(s): AFPTM, CEA, CA199, CHROMGRNA in the last 8760 hours.  Assessment and Plan:  Patient with past medical history of HTN, COPD, multiple  myeloma presents for repeat biopsy for gauge of response to treatment.  Case reviewed by Dr. Vernard Gambles who approves patient for procedure.  Patient presents today in their usual state of health.  He has been NPO and is not currently on blood thinners.   Risks and benefits discussed with the patient including, but not limited to bleeding, infection, damage to adjacent structures or low yield requiring additional tests.  All of the patient's questions were answered, patient is agreeable to proceed. Consent signed and in chart.  Thank you for this interesting consult.  I greatly enjoyed meeting Amarian Botero and look forward to participating in their care.  A copy of this report was sent to the requesting provider on this date.  Electronically Signed: Docia Barrier, PA 03/18/2018, 8:46 AM   I spent a total of  30 Minutes   in face to face in clinical consultation, greater than 50% of which was counseling/coordinating care for multiple myeloma.

## 2018-03-18 NOTE — Discharge Instructions (Signed)
Moderate Conscious Sedation, Adult, Care After These instructions provide you with information about caring for yourself after your procedure. Your health care provider may also give you more specific instructions. Your treatment has been planned according to current medical practices, but problems sometimes occur. Call your health care provider if you have any problems or questions after your procedure. What can I expect after the procedure? After your procedure, it is common:  To feel sleepy for several hours.  To feel clumsy and have poor balance for several hours.  To have poor judgment for several hours.  To vomit if you eat too soon.  Follow these instructions at home: For at least 24 hours after the procedure:   Do not: ? Participate in activities where you could fall or become injured. ? Drive. ? Use heavy machinery. ? Drink alcohol. ? Take sleeping pills or medicines that cause drowsiness. ? Make important decisions or sign legal documents. ? Take care of children on your own.  Rest. Eating and drinking  Follow the diet recommended by your health care provider.  If you vomit: ? Drink water, juice, or soup when you can drink without vomiting. ? Make sure you have little or no nausea before eating solid foods. General instructions  Have a responsible adult stay with you until you are awake and alert.  Take over-the-counter and prescription medicines only as told by your health care provider.  If you smoke, do not smoke without supervision.  Keep all follow-up visits as told by your health care provider. This is important. Contact a health care provider if:  You keep feeling nauseous or you keep vomiting.  You feel light-headed.  You develop a rash.  You have a fever. Get help right away if:  You have trouble breathing. This information is not intended to replace advice given to you by your health care provider. Make sure you discuss any questions you have  with your health care provider. Document Released: 08/11/2013 Document Revised: 03/25/2016 Document Reviewed: 02/10/2016 Elsevier Interactive Patient Education  2018 [place].   Bone Marrow Aspiration and Bone Marrow Biopsy, Adult, Care After This sheet gives you information about how to care for yourself after your procedure. Your health care provider may also give you more specific instructions. If you have problems or questions, contact your health care provider. What can I expect after the procedure? After the procedure, it is common to have:  Mild pain and tenderness.  Swelling.  Bruising.  Follow these instructions at home:  Take over-the-counter or prescription medicines only as told by your health care provider.  Do not take baths, swim, or use a hot tub until your health care provider approves. Ask if you can take a shower or have a sponge bath.  You may shower tomorrow.  Follow instructions from your health care provider about how to take care of the puncture site. Make sure you: ? Wash your hands with soap and water before you change your bandage (dressing). If soap and water are not available, use hand sanitizer. ? Change your dressing as told by your health care provider.  You may remove your dressing tomorrow.  Check your puncture siteevery day for signs of infection. Check for: ? More redness, swelling, or pain. ? More fluid or blood. ? Warmth. ? Pus or a bad smell.  Return to your normal activities as told by your health care provider. Ask your health care provider what activities are safe for you.  Do not drive  for 24 hours if you were given a medicine to help you relax (sedative). °· Keep all follow-up visits as told by your health care provider. This is important. °Contact a health care provider if: °· You have more redness, swelling, or pain around the puncture site. °· You have more fluid or blood coming from the puncture site. °· Your puncture site feels  warm to the touch. °· You have pus or a bad smell coming from the puncture site. °· You have a fever. °· Your pain is not controlled with medicine. °This information is not intended to replace advice given to you by your health care provider. Make sure you discuss any questions you have with your health care provider. °Document Released: 05/10/2005 Document Revised: 05/10/2016 Document Reviewed: 04/03/2016 °Elsevier Interactive Patient Education © 2018 Elsevier Inc. ° °

## 2018-03-20 ENCOUNTER — Telehealth (INDEPENDENT_AMBULATORY_CARE_PROVIDER_SITE_OTHER): Payer: Self-pay

## 2018-03-20 NOTE — Telephone Encounter (Signed)
-----   Message from [name], PA-C sent at 03/06/2018  5:20 PM EDT ----- No stomach bacteria, pancreas normal, moderate anemia, signs of malnourishment. I will send iron pills and stool softener to his pharmacy. May take protein shake supplements such as Boost or Ensure which are found OTC.

## 2018-03-20 NOTE — Telephone Encounter (Signed)
Aware of Residual right lower lobe infiltrate that may be bacteria. I will send abx. I will also send to pulmonology to address stable left pleural disease. Pt must suspend nortriptyline while she takes Levofloxacin (abx). Also aware that no stomach bacteria, normal pancreas, moderate anemia an signs of malnourishment. Iron pills and stool softener sent to pharmacy. May take a protein supplement shake such as boost or ensure which are OTC. Nat Christen, CMA

## 2018-03-22 NOTE — Progress Notes (Signed)
[facility] Follow-up Visit:  Assessment: Multiple myeloma ([place]) 57 y.o.  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  Based on the diagnosis, patient was started on treatment with bortezomib & low-dose dexamethasone.  Initially, lenalidomide was omitted out of concern for high risk of for infectious complications.  We have continued to therapy with just a doublet treatment and did not reintensify the therapy.  Treatment course has been complicated by exacerbation of pulmonary hypertension and now with shingles reactivation resulting in blistering rash and skin ulcerations on the left flank.  Infection appears to be responding to higher dose of acyclovir and skin is healing.  Today, we have reviewed the partial response of his disease to the 2-drug regimen and my concerns regarding his ability to tolerate possible intensification to 3 medications.  I do not believe that adding lenalidomide is going to be tolerated well by the patient.  Nevertheless, without the third medication, rates of response to therapy are significantly lower and complete remissions are much less likely.  After extensive discussion with the patient and his significant other, they agree to proceed with the palliative regimen continuing with bortezomib and dexamethasone.  Nevertheless, due to persistent active lesions of shingles, I would like to continue with holding therapy at this time to allow for improved healing.  Plan: -Continue holding bortezomib, low-dose dexamethasone.  Once resumed, will likely reduce the frequency of treatments to weekly therapy in the way of maintenance of the disease suppression until disease progression is noted. - Return to clinic in 2 weeks with labs to consider  possible next cycle of systemic therapy.  Voice recognition software was used and creation of this note. Despite my best effort at editing the text, some misspelling/errors may have occurred.  Orders Placed This Encounter  Procedures  . CBC with Differential (Cancer Center Only)    Standing Status:   Future    Number of Occurrences:   1    Standing Expiration Date:   03/04/2019  . CMP ([facility] only)    Standing Status:   Future    Number of Occurrences:   1    Standing Expiration Date:   03/04/2019  . Beta 2 microglobulin    Standing Status:   Future    Number of Occurrences:   1    Standing Expiration Date:   03/03/2019  . Multiple Myeloma Panel (SPEP&IFE w/QIG)    Standing Status:   Future    Number of Occurrences:   1    Standing Expiration Date:   03/03/2019  . Kappa/lambda light chains    Standing Status:   Future    Number of Occurrences:   1    Standing Expiration Date:   03/03/2019  . Lactate dehydrogenase (LDH)    Standing Status:   Future    Number of Occurrences:   1    Standing Expiration Date:   03/03/2019    Cancer Staging Multiple myeloma ([place]) Staging form: Plasma Cell Myeloma and Plasma Cell Disorders, AJCC 8th Edition - Clinical stage from 11/20/2017: RISS Stage II (Beta-2-microglobulin (mg/L): 2.1, Albumin (g/dL): 2.7, ISS: Stage II, High-risk cytogenetics: Absent, LDH: Normal) - Signed by Ardath Sax, MD on 12/09/2017   All questions were answered.  . The patient knows to call the  clinic with any problems, questions or concerns.  This note was electronically signed.    History of Presenting Illness [name] [name] is a 57 y.o. male followed in the [facility] for diagnosis of multiple myeloma. Patient's past medical history is significant for recurrent left empyema and pulmonary hypertension. Patient has been treated with VATS/decortication of the left lung.Also has history or heterozygous alpha-1 Anti-trypsin deficiency, MZ phenotype, COPD.  Patient  returns to the clinic for continued therapy with bortezomib, low-dose dexamethasone regimen.  In the interim, patient underwent restaging bone marrow biopsy consistent with partial disease response.  In the interim, patient had hospitalization due to shortness of breath which severe pulmonary hypertension was discovered.  Additionally, patient has persistent loculated pleural effusion with previous history of empyema.  In the interim, he was discharged from the hospital without a cycle of year and has developed rash over his left lower chest and wrapping around into the back consistent with exacerbation of shingles.  Started on therapeutic dose of acyclovir and rash is now healing although he has developed cutaneous erosions.  Presently, denies any fevers, chills, night sweats.  Respiratory status is stable.  No interval fever, chills, night sweats.  No new areas of rash.  Pain is well controlled.  Oncological/hematological History: --ECHO, 07/01/17: left ventricular ejection fraction 60 to 65%, normal Left ventricular wall thickness, impaired relaxation. --Labs, 08/26/17: SPEP -- restricted band in gamma globulins, suggestive of possible medical protein.    Multiple myeloma ([place])   09/29/2017 Tumor Marker    tProt 10.0, Alb 2.7, Ca 8.9, Cr 0.9, AP 75 LDH 169 SPEP -- M-Spike 3.4g/dL, SIFE -- IgG kappa;  IgG 4555, IgA 142, IgM 32; kappa 74.2, lambda 15.8, KLR 4.70;  WBC 5.2, Hgb 12.2, Plt 286      10/21/2017 PET scan    Hypermetabolic lytic lesion measuring 1.5 cm in L4. otherwise, no significant hypermetabolic skeletal disease.      11/03/2017 Bone Marrow Biopsy    Pathology: SLIGHTLY HYPERCELLULAR BONE MARROW FOR AGE WITH PLASMA CELL NEOPLASM. Peripheral blood RED BLOOD CELLS WITH ROULEAUX FORMATION. --The aspirate material is suboptimal with lack of bone marrow particles for evaluation. Touch imprints and core biopsy show a slightly hypercellular bone marrow for age with increased number of  atypical plasma cells representing 24% of all cells on touch imprints and associated with interstitial infiltrates and small clusters in the core biopsy. This is seen in a background of a mixture of myeloid cell types. Immunohistochemical stains highlight the increased plasma cell component in the bone marrow, which shows kappa light chain restriction consistent with plasma cell neoplasm. 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 --Cycle #2, 12/01/17: --Cycle #3, 12/22/17: --Cycle #4, 01/12/18:       11/24/2017 Tumor Marker    tProt 9.6, Alb 3.0, Ca 8.7, Cr 0.8, AP 107, AST 521, ALT 26 LDH 255, bete-2 microglobulin 1.9 SPEP -- MSpike 2.5g/dL IgG 3199, IgA 111, IgM 36; kappa 39.2, lambda 15.8, KLR 4.84; WBC 8.5, Hgb 14.4, Plt 120       01/02/2018 Bone Marrow Biopsy    There are increased monoclonal plasma cells (7% aspirate, 10% by CD138) consistent with persistent plasma cell neoplasm. Compared  to the prior marrow ([id number]) there has been a moderate decrease in plasma cells (24% to 10%). There is some atypia in the erythroid and megakaryocytic lineages. Given these changes were not noted in the prior marrow, they may be reactive in nature. --Cytogenetics: Normal --FISH: Normal (the previously noted clones positive for mutations are reduced below significance threshold).       Medical History: Past Medical History:  Diagnosis Date  . Acute on chronic respiratory failure ([place]) 02/07/2018  . Hypertension   . Kidney stones   . Pulmonary hypertension ([place])     Surgical History: Past Surgical History:  Procedure Laterality Date  . CARDIAC CATHETERIZATION N/A 01/18/2016   Procedure: Right  Heart Cath;  Surgeon: Jolaine Artist, MD;  Location: [facility];  Service: Cardiovascular;  Laterality: N/A;  . CARDIAC CATHETERIZATION N/A 06/24/2016   Procedure: Right Heart Cath;  Surgeon: Jolaine Artist, MD;  Location: [facility];  Service: Cardiovascular;  Laterality: N/A;  . KIDNEY SURGERY    . VIDEO ASSISTED THORACOSCOPY (VATS)/DECORTICATION Left 2003  . VIDEO ASSISTED THORACOSCOPY (VATS)/DECORTICATION Left 10/02/2015   Procedure: VIDEO ASSISTED THORACOSCOPY (VATS)/DECORTICATION and drainage of chronic empyena;  Surgeon: [name], MD;  Location: [facility];  Service: Thoracic;  Laterality: Left;  Marland Kitchen VIDEO BRONCHOSCOPY N/A 10/02/2015   Procedure: VIDEO BRONCHOSCOPY;  Surgeon: [name], MD;  Location: [facility] OR;  Service: Thoracic;  Laterality: N/A;    Family History: Family History  Problem Relation Age of Onset  . Hypertension Other     Social History: Social History   Socioeconomic History  . Marital status: Single    Spouse name: Not on file  . Number of children: Not on file  . Years of education: Not on file  . Highest education level: Not on file  Occupational History  . Not on file  Social Needs  . Financial resource strain: Not on file  . Food insecurity:    Worry: Not on file    Inability: Not on file  . Transportation needs:    Medical: Not on file    Non-medical: Not on file  Tobacco Use  . Smoking status: Never Smoker  . Smokeless tobacco: Never Used  Substance and Sexual Activity  . Alcohol use: Yes  . Drug use: No  . Sexual activity: Not on file  Lifestyle  . Physical activity:    Days per week: Not on file    Minutes per session: Not on file  . Stress: Not on file  Relationships  . Social connections:    Talks on phone: Not on file    Gets together: Not on file    Attends religious service: Not on file    Active member of club or organization: Not on file    Attends meetings of clubs or organizations: Not on  file    Relationship status: Not on file  . Intimate partner violence:    Fear of current or ex partner: Not on file    Emotionally abused: Not on file    Physically abused: Not on file    Forced sexual activity: Not on file  Other Topics Concern  . Not on file  Social History Narrative  . Not on file    Allergies: No Known Allergies  Medications:  Current Outpatient Medications  Medication Sig Dispense Refill  . acyclovir (ZOVIRAX) 400 MG tablet Take 1 tablet (400 mg total) by mouth 2 (two) times daily. 60 tablet 3  .  albuterol (PROVENTIL HFA;VENTOLIN HFA) 108 (90 Base) MCG/ACT inhaler Inhale 2 puffs into the lungs every 6 (six) hours as needed for wheezing or shortness of breath. 1 Inhaler 5  . dexamethasone (DECADRON) 4 MG tablet Take 5 tablets (20 mg total) by mouth once a week for 12 doses. 20 tablet 2  . docusate sodium (COLACE) 50 MG capsule Take 1 capsule (50 mg total) by mouth daily. 30 capsule 0  . ferrous sulfate 325 (65 FE) MG EC tablet Take 1 tablet (325 mg total) by mouth 3 (three) times daily with meals. 90 tablet 0  . nortriptyline (PAMELOR) 25 MG capsule Take 1 capsule (25 mg total) by mouth at bedtime. 30 capsule 3  . omeprazole (PRILOSEC) 40 MG capsule Take 1 capsule (40 mg total) by mouth daily. 30 capsule 3  . OXYGEN 2lpm BEDTIME ONLY    . tadalafil, PAH, (ADCIRCA) 20 MG tablet Take 2 tablets (40 mg total) by mouth daily. (Patient taking differently: Take 20 mg by mouth daily. ) 60 tablet 11  . umeclidinium-vilanterol (ANORO ELLIPTA) 62.5-25 MCG/INH AEPB Inhale 1 puff into the lungs daily. 60 each 5   No current facility-administered medications for this visit.     Review of Systems: Review of Systems  Gastrointestinal: Positive for constipation.  Genitourinary: Positive for difficulty urinating.   Musculoskeletal: Positive for flank pain.  All other systems reviewed and are negative.    PHYSICAL EXAMINATION Blood pressure 124/81, pulse 90, temperature  97.7 F (36.5 C), temperature source Oral, resp. rate 17, height 5' (1.524 m), weight 85 lb 9.6 oz (38.8 kg), SpO2 99 %.  ECOG PERFORMANCE STATUS: 1 - Symptomatic but completely ambulatory  Physical Exam  Constitutional: He is oriented to person, place, and time and well-developed, well-nourished, and in no distress. No distress.  HENT:  Head: Normocephalic and atraumatic.  Mouth/Throat: Oropharynx is clear and moist. No oropharyngeal exudate.  Eyes: Pupils are equal, round, and reactive to light. Conjunctivae are normal. No scleral icterus.  Neck: No thyromegaly present.  Cardiovascular: Normal rate, regular rhythm and normal heart sounds.  No murmur heard. Pulmonary/Chest:  Decreased breath sounds in the lower portion of the left lung.  Normal breath sounds on the right.  Abdominal: Soft. Bowel sounds are normal. He exhibits no distension. There is no tenderness. There is no rebound.  Musculoskeletal: He exhibits no edema.  Lymphadenopathy:    He has no cervical adenopathy.  Neurological: He is alert and oriented to person, place, and time. He has normal reflexes. No cranial nerve deficit.  Skin: Skin is warm and dry. No rash noted. He is not diaphoretic. No erythema.     LABORATORY DATA: I have personally reviewed the data as listed: Appointment on 03/03/2018  Component Date Value Ref Range Status  . Sodium 03/03/2018 132* 136 - 145 mmol/L Final  . Potassium 03/03/2018 3.7  3.5 - 5.1 mmol/L Final  . Chloride 03/03/2018 98  98 - 109 mmol/L Final  . CO2 03/03/2018 27  22 - 29 mmol/L Final  . Glucose, Bld 03/03/2018 194* 70 - 140 mg/dL Final  . BUN 03/03/2018 9  7 - 26 mg/dL Final  . Creatinine 03/03/2018 0.73  0.70 - 1.30 mg/dL Final  . Calcium 03/03/2018 8.6  8.4 - 10.4 mg/dL Final  . Total Protein 03/03/2018 7.3  6.4 - 8.3 g/dL Final  . Albumin 03/03/2018 2.5* 3.5 - 5.0 g/dL Final  . AST 03/03/2018 18  5 - 34 U/L Final  . ALT 03/03/2018  10  0 - 55 U/L Final  . Alkaline  Phosphatase 03/03/2018 73  40 - 150 U/L Final  . Total Bilirubin 03/03/2018 0.4  0.2 - 1.2 mg/dL Final  . GFR, Est Non Af Am 03/03/2018 >60  >60 mL/min Final  . GFR, Est AFR Am 03/03/2018 >60  >60 mL/min Final   Comment: (NOTE) The eGFR has been calculated using the CKD EPI equation. This calculation has not been validated in all clinical situations. eGFR's persistently <60 mL/min signify possible Chronic Kidney Disease.   Georgiann Hahn gap 03/03/2018 7  3 - 11 Final   Performed at [facility], [place] [address], [place], [place] [address]  . WBC Count 03/03/2018 7.9  4.0 - 10.3 K/uL Final  . RBC 03/03/2018 4.31  4.20 - 5.82 MIL/uL Final  . Hemoglobin 03/03/2018 10.7* 13.0 - 17.1 g/dL Final  . HCT 03/03/2018 33.7* 38.4 - 49.9 % Final  . MCV 03/03/2018 78.2* 79.3 - 98.0 fL Final  . MCH 03/03/2018 24.8* 27.2 - 33.4 pg Final  . MCHC 03/03/2018 31.8* 32.0 - 36.0 g/dL Final  . RDW 03/03/2018 20.3* 11.0 - 14.6 % Final  . Platelet Count 03/03/2018 422* 140 - 400 K/uL Final  . Neutrophils Relative % 03/03/2018 79  % Final  . Neutro Abs 03/03/2018 6.3  1.5 - 6.5 K/uL Final  . Lymphocytes Relative 03/03/2018 11  % Final  . Lymphs Abs 03/03/2018 0.9  0.9 - 3.3 K/uL Final  . Monocytes Relative 03/03/2018 8  % Final  . Monocytes Absolute 03/03/2018 0.6  0.1 - 0.9 K/uL Final  . Eosinophils Relative 03/03/2018 2  % Final  . Eosinophils Absolute 03/03/2018 0.2  0.0 - 0.5 K/uL Final  . Basophils Relative 03/03/2018 0  % Final  . Basophils Absolute 03/03/2018 0.0  0.0 - 0.1 K/uL Final   Performed at [facility], [place] [address], [place], [place] [address]       Ardath Sax, MD

## 2018-03-22 NOTE — Assessment & Plan Note (Signed)
57 y.o.  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  Based on the diagnosis, patient was started on treatment with bortezomib & low-dose dexamethasone.  Initially, lenalidomide was omitted out of concern for high risk of for infectious complications.  We have continued to therapy with just a doublet treatment and did not reintensify the therapy.  Treatment course has been complicated by exacerbation of pulmonary hypertension and now with shingles reactivation resulting in blistering rash and skin ulcerations on the left flank.  Infection appears to be responding to higher dose of acyclovir and skin is healing.  Today, we have reviewed the partial response of his disease to the 2-drug regimen and my concerns regarding his ability to tolerate possible intensification to 3 medications.  I do not believe that adding lenalidomide is going to be tolerated well by the patient.  Nevertheless, without the third medication, rates of response to therapy are significantly lower and complete remissions are much less likely.  After extensive discussion with the patient and his significant other, they agree to proceed with the palliative regimen continuing with bortezomib and dexamethasone.  Nevertheless, due to persistent active lesions of shingles, I would like to continue with holding therapy at this time to allow for improved healing.  Plan: -Continue holding bortezomib, low-dose dexamethasone.  Once resumed, will likely reduce the frequency of treatments to weekly therapy in the way of maintenance of the disease suppression until disease progression is noted. - Return to clinic in 2 weeks with labs to consider possible next cycle of systemic therapy. 

## 2018-03-23 ENCOUNTER — Inpatient Hospital Stay: Payer: Self-pay

## 2018-03-23 ENCOUNTER — Ambulatory Visit: Payer: Self-pay | Admitting: Adult Health

## 2018-03-23 VITALS — BP 114/79 | HR 100 | Temp 97.6°F | Resp 18

## 2018-03-23 DIAGNOSIS — C9000 Multiple myeloma not having achieved remission: Secondary | ICD-10-CM

## 2018-03-23 DIAGNOSIS — Z7189 Other specified counseling: Secondary | ICD-10-CM

## 2018-03-23 LAB — BASIC METABOLIC PANEL
Anion gap: 7 (ref 3–11)
BUN: 7 mg/dL (ref 7–26)
CHLORIDE: 98 mmol/L (ref 98–109)
CO2: 27 mmol/L (ref 22–29)
CREATININE: 0.81 mg/dL (ref 0.70–1.30)
Calcium: 9.7 mg/dL (ref 8.4–10.4)
GFR calc non Af Amer: >60 mL/min (ref >60)
Glucose, Bld: 193 mg/dL — ABNORMAL HIGH (ref 70–140)
Potassium: 4.8 mmol/L (ref 3.5–5.1)
Sodium: 132 mmol/L — ABNORMAL LOW (ref 136–145)

## 2018-03-23 MED ORDER — BORTEZOMIB CHEMO SQ INJECTION 3.5 MG (2.5MG/ML)
1.3000 mg/m2 | Freq: Once | INTRAMUSCULAR | Status: AC
  Administered 2018-03-23: 1.75 mg via SUBCUTANEOUS
  Filled 2018-03-23: qty 1.75

## 2018-03-23 MED ORDER — PROCHLORPERAZINE MALEATE 10 MG PO TABS
10.0000 mg | ORAL_TABLET | Freq: Once | ORAL | Status: AC
  Administered 2018-03-23: 10 mg via ORAL

## 2018-03-23 MED ORDER — PROCHLORPERAZINE MALEATE 10 MG PO TABS
ORAL_TABLET | ORAL | Status: AC
  Filled 2018-03-23: qty 1

## 2018-03-23 NOTE — Patient Instructions (Signed)
[facility] Discharge Instructions for Patients Receiving Chemotherapy  Today you received the following chemotherapy agents Velcade.  To help prevent nausea and vomiting after your treatment, we encourage you to take your nausea medication as directed.  If you develop nausea and vomiting that is not controlled by your nausea medication, call the clinic.   BELOW ARE SYMPTOMS THAT SHOULD BE REPORTED IMMEDIATELY:  *FEVER GREATER THAN 100.5 F  *CHILLS WITH OR WITHOUT FEVER  NAUSEA AND VOMITING THAT IS NOT CONTROLLED WITH YOUR NAUSEA MEDICATION  *UNUSUAL SHORTNESS OF BREATH  *UNUSUAL BRUISING OR BLEEDING  TENDERNESS IN MOUTH AND THROAT WITH OR WITHOUT PRESENCE OF ULCERS  *URINARY PROBLEMS  *BOWEL PROBLEMS  UNUSUAL RASH Items with * indicate a potential emergency and should be followed up as soon as possible.  Feel free to call the clinic should you have any questions or concerns. The clinic phone number is [phone].  Please show the CHEMO ALERT CARD at check-in to the Emergency Department and triage nurse.   

## 2018-03-25 ENCOUNTER — Other Ambulatory Visit: Payer: Self-pay | Admitting: Hematology and Oncology

## 2018-03-25 MED ORDER — HYDROMORPHONE HCL 2 MG PO TABS: 2.0000 mg | ORAL_TABLET | ORAL | 0 refills | Status: DC | PRN

## 2018-03-25 MED FILL — HYDROmorphone HCL 2 MG TABS: 2 | 8 days supply | Qty: 50 | Fill #0

## 2018-03-26 ENCOUNTER — Other Ambulatory Visit: Payer: Self-pay | Admitting: Hematology and Oncology

## 2018-03-31 ENCOUNTER — Inpatient Hospital Stay: Payer: Self-pay

## 2018-03-31 ENCOUNTER — Encounter (HOSPITAL_COMMUNITY): Payer: Self-pay | Admitting: Hematology and Oncology

## 2018-03-31 ENCOUNTER — Other Ambulatory Visit: Payer: Self-pay

## 2018-03-31 VITALS — BP 105/80 | HR 96 | Temp 98.7°F | Resp 16

## 2018-03-31 DIAGNOSIS — C9000 Multiple myeloma not having achieved remission: Secondary | ICD-10-CM

## 2018-03-31 DIAGNOSIS — Z7189 Other specified counseling: Secondary | ICD-10-CM

## 2018-03-31 LAB — CBC WITH DIFFERENTIAL (CANCER CENTER ONLY)
BASOS ABS: 0 10*3/uL (ref 0.0–0.1)
Basophils Relative: 0 %
EOS PCT: 0 %
Eosinophils Absolute: 0 10*3/uL (ref 0.0–0.5)
HEMATOCRIT: 31 % — AB (ref 38.4–49.9)
HEMOGLOBIN: 9.7 g/dL — AB (ref 13.0–17.1)
LYMPHS PCT: 3 %
Lymphs Abs: 0.5 10*3/uL — ABNORMAL LOW (ref 0.9–3.3)
MCH: 23.5 pg — ABNORMAL LOW (ref 27.2–33.4)
MCHC: 31.2 g/dL — ABNORMAL LOW (ref 32.0–36.0)
MCV: 75.4 fL — ABNORMAL LOW (ref 79.3–98.0)
Monocytes Absolute: 0.3 10*3/uL (ref 0.1–0.9)
Monocytes Relative: 2 %
NEUTROS ABS: 15.1 10*3/uL — AB (ref 1.5–6.5)
NEUTROS PCT: 95 %
PLATELETS: 644 10*3/uL — AB (ref 140–400)
RBC: 4.12 MIL/uL — AB (ref 4.20–5.82)
RDW: 20.9 % — ABNORMAL HIGH (ref 11.0–14.6)
WBC: 16 10*3/uL — AB (ref 4.0–10.3)

## 2018-03-31 LAB — CMP (CANCER CENTER ONLY)
ALT: 21 U/L (ref 0–55)
AST: 30 U/L (ref 5–34)
Albumin: 2.6 g/dL — ABNORMAL LOW (ref 3.5–5.0)
Alkaline Phosphatase: 87 U/L (ref 40–150)
Anion gap: 10 (ref 3–11)
BUN: 17 mg/dL (ref 7–26)
CHLORIDE: 96 mmol/L — AB (ref 98–109)
CO2: 25 mmol/L (ref 22–29)
CREATININE: 0.72 mg/dL (ref 0.70–1.30)
Calcium: 9 mg/dL (ref 8.4–10.4)
GFR, Est AFR Am: >60 mL/min (ref >60)
GFR, Estimated: >60 mL/min (ref >60)
Glucose, Bld: 186 mg/dL — ABNORMAL HIGH (ref 70–140)
Potassium: 4.4 mmol/L (ref 3.5–5.1)
Sodium: 131 mmol/L — ABNORMAL LOW (ref 136–145)
Total Bilirubin: <0.2 mg/dL — ABNORMAL LOW (ref 0.2–1.2)
Total Protein: 8 g/dL (ref 6.4–8.3)

## 2018-03-31 MED ORDER — PROCHLORPERAZINE MALEATE 10 MG PO TABS
ORAL_TABLET | ORAL | Status: AC
  Filled 2018-03-31: qty 1

## 2018-03-31 MED ORDER — PROCHLORPERAZINE MALEATE 10 MG PO TABS
10.0000 mg | ORAL_TABLET | Freq: Once | ORAL | Status: AC
  Administered 2018-03-31: 10 mg via ORAL

## 2018-03-31 MED ORDER — BORTEZOMIB CHEMO SQ INJECTION 3.5 MG (2.5MG/ML)
1.3000 mg/m2 | Freq: Once | INTRAMUSCULAR | Status: AC
  Administered 2018-03-31: 1.75 mg via SUBCUTANEOUS
  Filled 2018-03-31: qty 1.75

## 2018-03-31 NOTE — Patient Instructions (Signed)
[facility] Discharge Instructions for Patients Receiving Chemotherapy  Today you received the following chemotherapy agents velcade   To help prevent nausea and vomiting after your treatment, we encourage you to take your nausea medication as directed  If you develop nausea and vomiting that is not controlled by your nausea medication, call the clinic.   BELOW ARE SYMPTOMS THAT SHOULD BE REPORTED IMMEDIATELY:  *FEVER GREATER THAN 100.5 F  *CHILLS WITH OR WITHOUT FEVER  NAUSEA AND VOMITING THAT IS NOT CONTROLLED WITH YOUR NAUSEA MEDICATION  *UNUSUAL SHORTNESS OF BREATH  *UNUSUAL BRUISING OR BLEEDING  TENDERNESS IN MOUTH AND THROAT WITH OR WITHOUT PRESENCE OF ULCERS  *URINARY PROBLEMS  *BOWEL PROBLEMS  UNUSUAL RASH Items with * indicate a potential emergency and should be followed up as soon as possible.  Feel free to call the clinic you have any questions or concerns. The clinic phone number is [phone].  

## 2018-04-01 ENCOUNTER — Ambulatory Visit (INDEPENDENT_AMBULATORY_CARE_PROVIDER_SITE_OTHER)
Admission: RE | Admit: 2018-04-01 | Discharge: 2018-04-01 | Disposition: A | Discharge status: Home / Self Care | Payer: Self-pay | Source: Ambulatory Visit | Attending: Pulmonary Disease | Admitting: Pulmonary Disease

## 2018-04-01 ENCOUNTER — Encounter: Payer: Self-pay | Admitting: Pulmonary Disease

## 2018-04-01 ENCOUNTER — Telehealth: Payer: Self-pay | Admitting: Pulmonary Disease

## 2018-04-01 ENCOUNTER — Ambulatory Visit (INDEPENDENT_AMBULATORY_CARE_PROVIDER_SITE_OTHER): Payer: Self-pay | Admitting: Pulmonary Disease

## 2018-04-01 VITALS — BP 118/80 | HR 88 | Ht 60.0 in | Wt 83.6 lb

## 2018-04-01 DIAGNOSIS — I272 Pulmonary hypertension, unspecified: Secondary | ICD-10-CM

## 2018-04-01 DIAGNOSIS — J9611 Chronic respiratory failure with hypoxia: Secondary | ICD-10-CM

## 2018-04-01 DIAGNOSIS — J189 Pneumonia, unspecified organism: Secondary | ICD-10-CM

## 2018-04-01 DIAGNOSIS — J449 Chronic obstructive pulmonary disease, unspecified: Secondary | ICD-10-CM

## 2018-04-01 LAB — TISSUE HYBRIDIZATION (BONE MARROW)-NCBH

## 2018-04-01 LAB — CHROMOSOME ANALYSIS, BONE MARROW

## 2018-04-01 MED ORDER — UMECLIDINIUM-VILANTEROL 62.5-25 MCG/INH IN AEPB: 1.0000 | INHALATION_SPRAY | Freq: Every day | RESPIRATORY_TRACT | 0 refills | Status: DC

## 2018-04-01 NOTE — Telephone Encounter (Signed)
Spoke to [employer] pt is on private pay currently since 2017.  Pt can not get poc until he has taken care of current balance.  He has basically been getting free O2 for 2 years.  It is documented that pt has Medicaid pending & if this goes thru then he can get O2 on insurance.

## 2018-04-01 NOTE — Assessment & Plan Note (Signed)
Chest x-ray today Anoro sample today >>> Follow-up with community health and wellness for refills of Anoro   Walk test in office today to check for portable oxygen >>> Qualified for 2 L pulsed in office today  Follow-up with Dr. Lake Bells in 6 weeks

## 2018-04-01 NOTE — Progress Notes (Signed)
Reviewed, agree 

## 2018-04-01 NOTE — Assessment & Plan Note (Signed)
57 y.o.  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  Based on the diagnosis, patient was started on treatment with bortezomib & low-dose dexamethasone.  Initially, lenalidomide was omitted out of concern for high risk of for infectious complications.  We have continued to therapy with just a doublet treatment and did not reintensify the therapy.  Treatment course has been complicated by exacerbation of pulmonary hypertension and now with shingles reactivation resulting in blistering rash and skin ulcerations on the left flank.  Infection appears to be responding to higher dose of acyclovir and skin is healing.  Peripheral lab work demonstrates continued partial response to the therapy.  Intensification of the treatment is unlikely to result in added benefit due to increased risk of infections based on the patient's history.  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  Clinical evaluation lab work-up permissive to resume therapy.  Of note, patient has developed progressive anemia with concurrent rise and platelets.  Will assess for possible development of iron depletion.  Plan: -Resume systemic therapy for multiple myeloma: Bortezomib weekly with low-dose dexamethasone with cycles extended to every 4 weeks. - Additional labs today as outlined below - Return to clinic in 4 weeks: Restaging bone marrow biopsy prior to the clinic visit.  Labs, clinic visit and possible continuation of bortezomib therapy

## 2018-04-01 NOTE — Assessment & Plan Note (Signed)
Continue oxygen therapy as prescribed Qualified for portable oxygen today order placed with DME Anoro sample provided today  Follow-up with Dr. Lake Bells in 6 weeks

## 2018-04-01 NOTE — Telephone Encounter (Signed)
Spoke with pt's friend ok per DPR and notified needs to contact [place] to take care of outstanding balance before they will fulfill order for POC

## 2018-04-01 NOTE — Progress Notes (Addendum)
_0  ID: [name] [name], male    DOB: 17-Jan-1961, 57 y.o.   MRN: [id number]  Chief Complaint  Patient presents with  . Follow-up    ER visit for SOB and chest pain, reports his breathing is much better.     Referring provider: Tawny Asal  HPI: 57 year old male never smoker/montard in [place] since 1996 working in [place] referred to pulmonary clinic 03/26/2016 by Dr. Haroldine Laws for pulmonary hypertension in the setting of long-standing left empyema followed by Dr. Servando Snare with COPD and paraproteinemia   Synopsis: Referred in 2018 for evaluation of pulmonary hypertension in the setting of chronic left-sided hydropneumothorax and bronchiectasis.  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  Apparently this was originally treated as an empyema with VATS decortication in 2016.  CT imaging since then confirms bronchiectasis in the right lung and pulmonary function testing shows severe airflow obstruction.  In 2017 01/2017 he has had evidence of exertional hypoxemia.  Compliance with medications and oxygen use has been a challenge.   Recent [place] [facility] Encounters:   09/17/2017 Follow up - SG  Pt returns for follow up. He was seen by Dr. Lake Bells 08/26/2017 for fatigue and shortness of breath.   08/26/18 - D. McQuaid Plan: Bronchiectasis with severe airflow obstruction: Restart Anoro, take this once a day no matter how you feel, samples given, Use albuterol as needed, Flu shot today Paraproteinemia (remarkably elevated IgG level):, Check SPEP to evaluate for myeloma Chronic respiratory failure with hypoxemia:, Keep using oxygen when you sleep, 6MW next visit Pulmonary hypertension: - Restart Adcirca 1m daily   Tests:   09/17/2017 6 Minute Walk: stopped with pt stopped with 267m 44sec left on the clock d/t fatigue (Borg = 3) and dyspnea (Borg = 4); O2 sat = 92% RA, HR = 100.  Walked 360 feet RHC: 06/2017 RA = 3, RV = 59/6   PA = 62/23  (40) PCW = 6 Fick cardiac output/index = 4.2/2.9 PVR = 8.1 WU Ao sat = 93%  PA sat = 67%, 68%  Echo: August 2018 LVEF 60-65%, D shaped interventricular septum suggestive of RV pressure volume overload, moderately dilated right ventricle with moderately depressed systolic function, PA pressure estimate 59 mmHg, RA dilation, valvues OK, moderate Tricuspid regurgitation  Chest imaging: August 2018 chest x-ray images independently reviewed the prominent pulmonary vasculature, scar versus consolidation right lung, chronic appearing left-sided pleural effusion with apical pneumothorax4 January 2018 CT angiogram chest images independently reviewed showing chronic appearing hydropneumothorax in the left lung, scattered consolidation patches in the right upper lobe and right lower lobe, there is also bronchiectasis changes in the right lower lobe.  Pulmonary function test: April 2017 ratio 45%, FEV1 0.56 L 21% predicted, FVC 1.25 L 37% predicted, total lung capacity 2.88 L 58% predicted, DLCO 10.30 54% predicted: corrected and classic curvature  Micro: 11/2016 Sputum: normal flora  Labs: 04/2016 Alpha 1 MZ, normal level; total IgG level severely elevated, IgA normal, IgM low, IgE 2,066   Chart Review:  03/05/2018-office visit-primary care-hospital discharge follow-up Showing follow-up with oncology 02/07/2018-hospitalization- acute on chronic respiratory failure with hypoxia and COPD exacerbation, recent healthcare acquired pneumonia   04/01/18  [age]ear old male patient presents today for hospital follow-up with interpreter and caregiver present.  Patient reports she is been feeling a lot better since being discharged from the hospital.  Chest x-ray on 03/05/2018 shows resolving pulmonary infiltrates.  Patient reporting adherence to his Anoro inhaler, as well as his other medication  regimens.  Patient does believe he is almost out of his Anoro inhaler and has not yet called the [facility] center to see if they can provide a refill.  Patient has had consistent regular follow-up with oncology as well.  Patient also reporting need to be tested for portable oxygen per home health care company.   No Known Allergies  Immunization History  Administered Date(s) Administered  . Influenza,inj,Quad PF,6+ Mos 09/27/2015, 08/26/2017  . PPD Test 03/09/2018  . Pneumococcal Polysaccharide-23 10/20/2015    Past Medical History:  Diagnosis Date  . Acute on chronic respiratory failure ([place]) 02/07/2018  . Hypertension   . Kidney stones   . Pulmonary hypertension ([facility])     Tobacco History: Social History   Tobacco Use  Smoking Status Never Smoker  Smokeless Tobacco Never Used   Counseling given: Not Answered Continue not smoking  Outpatient Encounter Medications as of 04/01/2018  Medication Sig  . acyclovir (ZOVIRAX) 400 MG tablet Take 1 tablet (400 mg total) by mouth 2 (two) times daily.  Marland Kitchen albuterol (PROVENTIL HFA;VENTOLIN HFA) 108 (90 Base) MCG/ACT inhaler Inhale 2 puffs into the lungs every 6 (six) hours as needed for wheezing or shortness of breath.  . dexamethasone (DECADRON) 4 MG tablet Take 5 tablets (20 mg total) by mouth once a week for 12 doses.  Marland Kitchen docusate sodium (COLACE) 50 MG capsule Take 1 capsule (50 mg total) by mouth daily.  . ferrous sulfate 325 (65 FE) MG EC tablet Take 1 tablet (325 mg total) by mouth 3 (three) times daily with meals.  Marland Kitchen HYDROmorphone (DILAUDID) 2 MG tablet Take 1 tablet (2 mg total) by mouth every 4 (four) hours as needed for severe pain.  . nortriptyline (PAMELOR) 25 MG capsule Take 1 capsule (25 mg total) by mouth at bedtime.  Marland Kitchen omeprazole (PRILOSEC) 40 MG capsule Take 1 capsule (40 mg total) by mouth daily.  . OXYGEN 2lpm BEDTIME ONLY  . tadalafil, PAH, (ADCIRCA) 20 MG tablet Take 2 tablets (40 mg total) by mouth daily. (Patient taking differently: Take 20 mg by mouth daily. )  . umeclidinium-vilanterol (ANORO ELLIPTA) 62.5-25  MCG/INH AEPB Inhale 1 puff into the lungs daily.  Marland Kitchen umeclidinium-vilanterol (ANORO ELLIPTA) 62.5-25 MCG/INH AEPB Inhale 1 puff into the lungs daily.   No facility-administered encounter medications on file as of 04/01/2018.      Review of Systems  Constitutional:   No  weight loss, night sweats,  fevers, chills, fatigue, or  lassitude HEENT:  +2x in the past 2 weeks feeling of food getting stuck in throat, No headaches,Tooth/dental problems, or  Sore throat, No sneezing, itching, ear ache, nasal congestion, post nasal drip  CV: No chest pain,  orthopnea, PND, swelling in lower extremities, anasarca, dizziness, palpitations, syncope  GI: No heartburn, indigestion, abdominal pain, nausea, vomiting, diarrhea, change in bowel habits, loss of appetite, bloody stools Resp: +occasional sob with exertion, resolves with rest No shortness of breath at rest.  No excess mucus, no productive cough,  No non-productive cough,  No coughing up of blood.  No change in color of mucus.  No wheezing.  No chest wall deformity Skin: no rash, lesions, no skin changes. GU: no dysuria, change in color of urine, no urgency or frequency.  No flank pain, no hematuria  MS:  No joint pain or swelling.  No decreased range of motion.  No back pain. Psych:  No change in mood or affect. No depression or anxiety.  No memory  loss.   Physical Exam  BP 118/80   Pulse 88   Ht 5' (1.524 m)   Wt 83 lb 9.6 oz (37.9 kg)   SpO2 95%   BMI 16.33 kg/m   GEN: A/Ox3; pleasant , NAD, well nourished, chronically ill    HEENT:  [place]/AT,  EACs-clear, TMs-wnl, NOSE-clear, THROAT-clear, no lesions, no postnasal drip or exudate noted.   NECK:  Supple w/ fair ROM; no JVD; normal carotid impulses w/o bruits; no thyromegaly or nodules palpated; no lymphadenopathy.    RESP:  +left base crackles, right crackles in mid and lower lobe no accessory muscle use, no dullness to percussion  CARD:  RRR, no m/r/g, no peripheral edema, pulses intact,  no cyanosis or clubbing.  GI:   Soft & +tender LUQ and LLQ (pt s/p bone biopsy); nml bowel sounds; no organomegaly or masses detected.   Musco: Warm bil, no deformities or joint swelling noted.   Neuro: alert, no focal deficits noted.    Skin: Warm, no lesions or rashes, flaky dry skin    Lab Results:  CBC    Component Value Date/Time   WBC 16.0 (H) 03/31/2018 1440   WBC 10.6 (H) 03/18/2018 0728   RBC 4.12 (L) 03/31/2018 1440   HGB 9.7 (L) 03/31/2018 1440   HGB 9.5 (L) 03/05/2018 1206   HGB 13.1 11/07/2017 1014   HCT 31.0 (L) 03/31/2018 1440   HCT 32.3 (L) 03/05/2018 1206   HCT 39.7 11/07/2017 1014   PLT 644 (H) 03/31/2018 1440   PLT 474 (H) 03/05/2018 1206   MCV 75.4 (L) 03/31/2018 1440   MCV 82 03/05/2018 1206   MCV 87.6 11/07/2017 1014   MCH 23.5 (L) 03/31/2018 1440   MCHC 31.2 (L) 03/31/2018 1440   RDW 20.9 (H) 03/31/2018 1440   RDW 20.5 (H) 03/05/2018 1206   RDW 14.5 11/07/2017 1014   LYMPHSABS 0.5 (L) 03/31/2018 1440   LYMPHSABS 1.5 03/05/2018 1206   LYMPHSABS 1.7 11/07/2017 1014   MONOABS 0.3 03/31/2018 1440   MONOABS 0.8 11/07/2017 1014   EOSABS 0.0 03/31/2018 1440   EOSABS 0.4 03/05/2018 1206   BASOSABS 0.0 03/31/2018 1440   BASOSABS 0.1 03/05/2018 1206   BASOSABS 0.0 11/07/2017 1014    BMET    Component Value Date/Time   NA 131 (L) 03/31/2018 1440   NA 134 03/05/2018 1206   NA 138 11/07/2017 1014   K 4.4 03/31/2018 1440   K 4.1 11/07/2017 1014   CL 96 (L) 03/31/2018 1440   CO2 25 03/31/2018 1440   CO2 30 (H) 11/07/2017 1014   GLUCOSE 186 (H) 03/31/2018 1440   GLUCOSE 90 11/07/2017 1014   BUN 17 03/31/2018 1440   BUN 7 03/05/2018 1206   BUN 12.0 11/07/2017 1014   CREATININE 0.72 03/31/2018 1440   CREATININE 0.9 11/07/2017 1014   CALCIUM 9.0 03/31/2018 1440   CALCIUM 8.5 11/07/2017 1014   GFRNONAA >60 03/31/2018 1440   GFRAA >60 03/31/2018 1440    BNP    Component Value Date/Time   BNP 456.2 (H) 01/26/2018 1900    ProBNP No  results found for: PROBNP  Imaging: Dg Chest 2 View  Result Date: 03/05/2018 CLINICAL DATA:  Abnormal breath sounds EXAM: CHEST - 2 VIEW COMPARISON:  02/12/2018, 02/07/2018, 06/12/2017, CT chest 02/07/2018 FINDINGS: Moderate left pleural disease, similar compared to prior, with calcified pleural plaques. Clearing of previously noted infiltrates in the right upper and lower lung zones. No new abnormality. Tiny right pleural effusion or  thickening. Stable cardiomediastinal silhouette with aortic atherosclerosis. No pneumothorax. IMPRESSION: 1. Significant clearing of previously noted right upper and lower lung infiltrates compared to prior radiograph with minimal residual in the right lower lobe. 2. No significant interval change in the chronic left pleural process and consolidation at the lingula and left base. Electronically Signed   By: Donavan Foil M.D.   On: 03/05/2018 22:15   Ct Biopsy  Result Date: 03/18/2018 CLINICAL DATA:  Follow-up multiple myeloma EXAM: CT GUIDED DEEP ILIAC BONE ASPIRATION AND CORE BIOPSY TECHNIQUE: Patient was placed supine on the CT gantry and limited axial scans through the pelvis were obtained. Appropriate skin entry site was identified. Skin site was marked, prepped with chlorhexidine, draped in usual sterile fashion, and infiltrated locally with 1% lidocaine. Intravenous Fentanyl and Versed were administered as conscious sedation during continuous monitoring of the patient's level of consciousness and physiological / cardiorespiratory status by the radiology RN, with a total moderate sedation time of 11 minutes. Under CT fluoroscopic guidance an 11-gauge Cook trocar bone needle was advanced into the right iliac bone just lateral to the sacroiliac joint. Once needle tip position was confirmed, core and aspiration samples were obtained, submitted to pathology for approval. Post procedure scans show no hematoma or fracture. Patient tolerated procedure well. COMPLICATIONS:  COMPLICATIONS none IMPRESSION: 1. Technically successful CT guided right iliac bone core and aspiration biopsy. Electronically Signed   By: Lucrezia Europe M.D.   On: 03/18/2018 12:48   Ct Bone Marrow Biopsy & Aspiration  Result Date: 03/18/2018 CLINICAL DATA:  Follow-up multiple myeloma EXAM: CT GUIDED DEEP ILIAC BONE ASPIRATION AND CORE BIOPSY TECHNIQUE: Patient was placed supine on the CT gantry and limited axial scans through the pelvis were obtained. Appropriate skin entry site was identified. Skin site was marked, prepped with chlorhexidine, draped in usual sterile fashion, and infiltrated locally with 1% lidocaine. Intravenous Fentanyl and Versed were administered as conscious sedation during continuous monitoring of the patient's level of consciousness and physiological / cardiorespiratory status by the radiology RN, with a total moderate sedation time of 11 minutes. Under CT fluoroscopic guidance an 11-gauge Cook trocar bone needle was advanced into the right iliac bone just lateral to the sacroiliac joint. Once needle tip position was confirmed, core and aspiration samples were obtained, submitted to pathology for approval. Post procedure scans show no hematoma or fracture. Patient tolerated procedure well. COMPLICATIONS: COMPLICATIONS none IMPRESSION: 1. Technically successful CT guided right iliac bone core and aspiration biopsy. Electronically Signed   By: Lucrezia Europe M.D.   On: 03/18/2018 12:48     Assessment & Plan:    57 year old patient in office today for hospital follow-up.  Patient with caregiver as well as interpreter.  Patient walked in office today and did qualify for 2 L pulsed portable oxygen.  Ordered order placed today.  Anoro sample provided today patient to follow-up with [facility] for more refills as well as to ensure he has all of his other medications.  Patient and caregiver report that they have plenty of medications at this point in time with the  exception of the Anoro.  Patient to follow-up with Dr. Lake Bells in 6 weeks. Will complete chest x-ray today to monitor the status of his pulmonary infiltrates.   Pulmonary HTN ([place]) Continue Adcirca. Notify office if you have any issues obtaining this medication Follow-up with Dr. Lake Bells in 6 weeks  Facility-acquired pneumonia Chest x-ray today Anoro sample today >>> Follow-up with community health and  wellness for refills of Anoro   Walk test in office today to check for portable oxygen >>> Qualified for 2 L pulsed in office today  Follow-up with Dr. Lake Bells in 6 weeks  COPD GOLD IV criteria but never smoked  Chest x-ray today Anoro sample today >>> Follow-up with community health and wellness for refills of Anoro   Walk test in office today to check for portable oxygen >>> Qualified for 2 L pulsed in office today  Follow-up with Dr. Lake Bells in 6 weeks  Chronic respiratory failure with hypoxia ([place]) Continue oxygen therapy as prescribed Qualified for portable oxygen today order placed with DME Anoro sample provided today  Follow-up with Dr. Lake Bells in 6 weeks   This appointment was 45 minutes long with over 50% of this time being direct face-to-face patient care.  With an interpreter present.  As well as caregiver.  Reviewed hospital discharge information.  Recent follow-up appointments with primary care.  And discuss current plan of care as well as assessment and patient education.  Also reviewed previous chest x-rays with patient.   Lauraine Rinne, NP 04/01/2018

## 2018-04-01 NOTE — Progress Notes (Signed)
Your chest x-ray results have come back showing no acute changes.  We will continue to monitor at this point in time.  No changes to the plan of care.  Continue follow-up with Dr. Lake Bells in 6 weeks.  Wyn Quaker FNP

## 2018-04-01 NOTE — Patient Instructions (Addendum)
Chest x-ray today Anoro sample today >>> Follow-up with community health and wellness for refills of Anoro   Walk test in office today to check for portable oxygen >>> Qualified for 2 L pulsed in office today  Follow-up with Dr. Lake Bells in 6 weeks  Please contact office if you are having issues with breathing, fever, worsening symptoms, or any issues obtaining your medications.   Please contact the office if your symptoms worsen or you have concerns that you are not improving.   Thank you for choosing [facility] for your healthcare, and for allowing [place] to partner with you on your healthcare journey. I am thankful to be able to provide care to you today.   Wyn Quaker FNP-C

## 2018-04-01 NOTE — Progress Notes (Signed)
[name] [name] Follow-up Visit:  Assessment: Multiple myeloma ([name]) 57 y.o.  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  Based on the diagnosis, patient was started on treatment with bortezomib & low-dose dexamethasone.  Initially, lenalidomide was omitted out of concern for high risk of for infectious complications.  We have continued to therapy with just a doublet treatment and did not reintensify the therapy.  Treatment course has been complicated by exacerbation of pulmonary hypertension and now with shingles reactivation resulting in blistering rash and skin ulcerations on the left flank.  Infection appears to be responding to higher dose of acyclovir and skin is healing.  Peripheral lab work demonstrates continued partial response to the therapy.  Intensification of the treatment is unlikely to result in added benefit due to increased risk of infections based on the patient's history.  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  Clinical evaluation lab work-up permissive to resume therapy.  Of note, patient has developed progressive anemia with concurrent rise and platelets.  Will assess for possible development of iron depletion.  Plan: -Resume systemic therapy for multiple myeloma: Bortezomib weekly with low-dose dexamethasone with cycles extended to every 4 weeks. - Additional labs today as outlined below - Return to clinic in 4 weeks: Restaging bone marrow biopsy prior to the clinic visit.  Labs, clinic visit and possible continuation of bortezomib therapy  Voice recognition software was used and creation of this note. Despite my best effort at editing the text, some misspelling/errors may have occurred.  Orders Placed This Encounter  Procedures  .  CT BIOPSY    Order Specific Question:   Lab orders requested (DO NOT place separate lab orders, these will be automatically ordered during procedure specimen collection):    Answer:   Oligo clonal bands    Comments:   Path, flow, cytogenetics, and MM FISH    Order Specific Question:   Lab orders requested (DO NOT place separate lab orders, these will be automatically ordered during procedure specimen collection):    Answer:   Cytology - Non Pap    Order Specific Question:   Lab orders requested (DO NOT place separate lab orders, these will be automatically ordered during procedure specimen collection):    Answer:   Surgical Pathology    Order Specific Question:   Reason for Exam (SYMPTOM  OR DIAGNOSIS REQUIRED)    Answer:   multiple myeloma monitoring    Order Specific Question:   Preferred imaging location?    Answer:   [facility]    Order Specific Question:   Radiology Contrast Protocol - do NOT remove file path    Answer:   \\[email or web address]\epicdata\Radiant\CTProtocols.pdf  . CT BONE MARROW BIOPSY & ASPIRATION    Standing Status:   Future    Number of Occurrences:   1    Standing Expiration Date:   06/17/2019    Order Specific Question:   Reason for Exam (SYMPTOM  OR DIAGNOSIS REQUIRED)    Answer:   MM foloow-up    Order Specific Question:   Preferred imaging location?    Answer:   [facility]    Order Specific Question:   Radiology Contrast Protocol - do NOT remove file path    Answer:   \\charchive\epicdata\Radiant\CTProtocols.pdf  . CBC with Differential (  [place] Only)    Standing Status:   Future    Standing Expiration Date:   03/17/2019  . CMP ([place] only)    Standing Status:   Future    Standing Expiration Date:   03/17/2019  . Iron and TIBC    Standing Status:   Future    Standing Expiration Date:   03/17/2019  . Ferritin    Standing Status:   Future    Standing Expiration Date:   03/17/2019  . Kappa/lambda light chains    Standing Status:    Future    Standing Expiration Date:   03/16/2019  . Multiple Myeloma Panel (SPEP&IFE w/QIG)    Standing Status:   Future    Standing Expiration Date:   03/16/2019  . Beta 2 microglobulin    Standing Status:   Future    Standing Expiration Date:   03/16/2019  . Lactate dehydrogenase (LDH)    Standing Status:   Future    Standing Expiration Date:   03/16/2019    [name] Staging Multiple myeloma ([place]) Staging form: Plasma Cell Myeloma and Plasma Cell Disorders, AJCC 8th Edition - Clinical stage from 11/20/2017: RISS Stage II (Beta-2-microglobulin (mg/L): 2.1, Albumin (g/dL): 2.7, ISS: Stage II, High-risk cytogenetics: Absent, LDH: Normal) - Signed by Ardath Sax, MD on 12/09/2017   All questions were answered.  . The patient knows to call the clinic with any problems, questions or concerns.  This note was electronically signed.    History of Presenting Illness [name] [name] is a 57 y.o. male followed in the [place] for diagnosis of multiple myeloma. Patient's past medical history is significant for recurrent left empyema and pulmonary hypertension. Patient has been treated with VATS/decortication of the left lung.Also has history or heterozygous alpha-1 Anti-trypsin deficiency, MZ phenotype, COPD.  Patient returns to the clinic for continued therapy with bortezomib, low-dose dexamethasone regimen.  Has recovered from the outbreak of shingles.  Skin is fully healed with no new blistering no recurrent ulceration.  No interval fevers, chills, night sweats.  Oncological/hematological History: --ECHO, 07/01/17: left ventricular ejection fraction 60 to 65%, normal Left ventricular wall thickness, impaired relaxation. --Labs, 08/26/17: SPEP -- restricted band in gamma globulins, suggestive of possible medical protein.    Multiple myeloma ([place])   09/29/2017 Tumor Marker    tProt 10.0, Alb 2.7, Ca 8.9, Cr 0.9, AP 75 LDH 169 SPEP -- M-Spike 3.4g/dL, SIFE -- IgG kappa;  IgG 4555, IgA 142,  IgM 32; kappa 74.2, lambda 15.8, KLR 4.70;  WBC 5.2, Hgb 12.2, Plt 286      10/21/2017 PET scan    Hypermetabolic lytic lesion measuring 1.5 cm in L4. otherwise, no significant hypermetabolic skeletal disease.      11/03/2017 Bone Marrow Biopsy    Pathology: SLIGHTLY HYPERCELLULAR BONE MARROW FOR AGE WITH PLASMA CELL NEOPLASM. Peripheral blood RED BLOOD CELLS WITH ROULEAUX FORMATION. --The aspirate material is suboptimal with lack of bone marrow particles for evaluation. Touch imprints and core biopsy show a slightly hypercellular bone marrow for age with increased number of atypical plasma cells representing 24% of all cells on touch imprints and associated with interstitial infiltrates and small clusters in the core biopsy. This is seen in a background of a mixture of myeloid cell types. Immunohistochemical stains highlight the increased plasma cell component in the bone marrow, which shows kappa light chain restriction consistent with plasma cell neoplasm. --CytoGen: No clonal cytogenetic abnormalities --FISH: Positive for loss of [id number] and loss of 13q34  11/07/2017 Initial Diagnosis    Multiple myeloma ([place])      11/10/2017 -  Chemotherapy    Bortezomib 1.'3mg'$ /m2, d1,4,8,11 + Dexamethasone '40mg'$  QWk Q21d --Cycle #1, 11/10/17: Therapy started without lenalidomide to assess tolerance. --Cycle #2, 12/01/17: --Cycle #3, 12/22/17: --Cycle #4, 01/12/18:       11/24/2017 Tumor Marker    tProt 9.6, Alb 3.0, Ca 8.7, Cr 0.8, AP 107, AST 521, ALT 26 LDH 255, bete-2 microglobulin 1.9 SPEP -- MSpike 2.5g/dL IgG 3199, IgA 111, IgM 36; kappa 39.2, lambda 15.8, KLR 4.84; WBC 8.5, Hgb 14.4, Plt 120       01/02/2018 Bone Marrow Biopsy    There are increased monoclonal plasma cells (7% aspirate, 10% by CD138) consistent with persistent plasma cell neoplasm. Compared to the prior marrow ([id number]) there has been a moderate decrease in plasma cells (24% to 10%). There is some atypia in the  erythroid and megakaryocytic lineages. Given these changes were not noted in the prior marrow, they may be reactive in nature. --Cytogenetics: Normal --FISH: Normal (the previously noted clones positive for mutations are reduced below significance threshold).       Medical History: Past Medical History:  Diagnosis Date  . Acute on chronic respiratory failure ([place]) 02/07/2018  . Hypertension   . Kidney stones   . Pulmonary hypertension ([place])     Surgical History: Past Surgical History:  Procedure Laterality Date  . CARDIAC CATHETERIZATION N/A 01/18/2016   Procedure: Right Heart Cath;  Surgeon: Jolaine Artist, MD;  Location: [place] [facility];  Service: Cardiovascular;  Laterality: N/A;  . CARDIAC CATHETERIZATION N/A 06/24/2016   Procedure: Right Heart Cath;  Surgeon: Jolaine Artist, MD;  Location: [place] [facility];  Service: Cardiovascular;  Laterality: N/A;  . KIDNEY SURGERY    . VIDEO ASSISTED THORACOSCOPY (VATS)/DECORTICATION Left 2003  . VIDEO ASSISTED THORACOSCOPY (VATS)/DECORTICATION Left 10/02/2015   Procedure: VIDEO ASSISTED THORACOSCOPY (VATS)/DECORTICATION and drainage of chronic empyena;  Surgeon: [name], MD;  Location: [place];  Service: Thoracic;  Laterality: Left;  Marland Kitchen VIDEO BRONCHOSCOPY N/A 10/02/2015   Procedure: VIDEO BRONCHOSCOPY;  Surgeon: [name], MD;  Location: [facility];  Service: Thoracic;  Laterality: N/A;    Family History: Family History  Problem Relation Age of Onset  . Hypertension Other     Social History: Social History   Socioeconomic History  . Marital status: Single    Spouse name: Not on file  . Number of children: Not on file  . Years of education: Not on file  . Highest education level: Not on file  Occupational History  . Not on file  Social Needs  . Financial resource strain: Not on file  . Food insecurity:    Worry: Not on file    Inability: Not on file  . Transportation needs:    Medical: Not on file     Non-medical: Not on file  Tobacco Use  . Smoking status: Never Smoker  . Smokeless tobacco: Never Used  Substance and Sexual Activity  . Alcohol use: Yes  . Drug use: No  . Sexual activity: Not on file  Lifestyle  . Physical activity:    Days per week: Not on file    Minutes per session: Not on file  . Stress: Not on file  Relationships  . Social connections:    Talks on phone: Not on file    Gets together: Not on file    Attends religious service: Not on file  Active member of club or organization: Not on file    Attends meetings of clubs or organizations: Not on file    Relationship status: Not on file  . Intimate partner violence:    Fear of current or ex partner: Not on file    Emotionally abused: Not on file    Physically abused: Not on file    Forced sexual activity: Not on file  Other Topics Concern  . Not on file  Social History Narrative  . Not on file    Allergies: No Known Allergies  Medications:  Current Outpatient Medications  Medication Sig Dispense Refill  . albuterol (PROVENTIL HFA;VENTOLIN HFA) 108 (90 Base) MCG/ACT inhaler Inhale 2 puffs into the lungs every 6 (six) hours as needed for wheezing or shortness of breath. 1 Inhaler 5  . docusate sodium (COLACE) 50 MG capsule Take 1 capsule (50 mg total) by mouth daily. 30 capsule 0  . ferrous sulfate 325 (65 FE) MG EC tablet Take 1 tablet (325 mg total) by mouth 3 (three) times daily with meals. 90 tablet 0  . nortriptyline (PAMELOR) 25 MG capsule Take 1 capsule (25 mg total) by mouth at bedtime. 30 capsule 3  . omeprazole (PRILOSEC) 40 MG capsule Take 1 capsule (40 mg total) by mouth daily. 30 capsule 3  . OXYGEN 2lpm BEDTIME ONLY    . tadalafil, PAH, (ADCIRCA) 20 MG tablet Take 2 tablets (40 mg total) by mouth daily. (Patient taking differently: Take 20 mg by mouth daily. ) 60 tablet 11  . umeclidinium-vilanterol (ANORO ELLIPTA) 62.5-25 MCG/INH AEPB Inhale 1 puff into the lungs daily. 60 each 5  .  acyclovir (ZOVIRAX) 400 MG tablet Take 1 tablet (400 mg total) by mouth 2 (two) times daily. 60 tablet 3  . dexamethasone (DECADRON) 4 MG tablet Take 5 tablets (20 mg total) by mouth once a week for 12 doses. 20 tablet 2  . HYDROmorphone (DILAUDID) 2 MG tablet Take 1 tablet (2 mg total) by mouth every 4 (four) hours as needed for severe pain. 50 tablet 0  . umeclidinium-vilanterol (ANORO ELLIPTA) 62.5-25 MCG/INH AEPB Inhale 1 puff into the lungs daily. 1 each 0   No current facility-administered medications for this visit.     Review of Systems: Review of Systems  Gastrointestinal: Positive for constipation.  Genitourinary: Positive for difficulty urinating.   Musculoskeletal: Positive for flank pain.  All other systems reviewed and are negative.    PHYSICAL EXAMINATION Blood pressure 122/85, pulse 97, temperature 97.9 F (36.6 C), temperature source Oral, resp. rate 18, height '5\' 1"'$  (1.549 m), weight 85 lb 4.8 oz (38.7 kg), SpO2 96 %.  ECOG PERFORMANCE STATUS: 1 - Symptomatic but completely ambulatory  Physical Exam  Constitutional: He is oriented to person, place, and time and well-developed, well-nourished, and in no distress. No distress.  HENT:  Head: Normocephalic and atraumatic.  Mouth/Throat: Oropharynx is clear and moist. No oropharyngeal exudate.  Eyes: Pupils are equal, round, and reactive to light. Conjunctivae are normal. No scleral icterus.  Neck: No thyromegaly present.  Cardiovascular: Normal rate, regular rhythm and normal heart sounds.  No murmur heard. Pulmonary/Chest:  Decreased breath sounds in the lower portion of the left lung.  Normal breath sounds on the right.  Abdominal: Soft. Bowel sounds are normal. He exhibits no distension. There is no tenderness. There is no rebound.  Musculoskeletal: He exhibits no edema.  Lymphadenopathy:    He has no cervical adenopathy.  Neurological: He is alert and oriented  to person, place, and time. He has normal reflexes.  No cranial nerve deficit.  Skin: Skin is warm and dry. No rash noted. He is not diaphoretic. No erythema.     LABORATORY DATA: I have personally reviewed the data as listed: Appointment on 03/12/2018  Component Date Value Ref Range Status  . LDH 03/12/2018 214  125 - 245 U/L Final   Performed at [facility], [place] [address] [place], [place] [address]  . Kappa free light chain 03/12/2018 59.9* 3.3 - 19.4 mg/L Final  . Lamda free light chains 03/12/2018 33.6* 5.7 - 26.3 mg/L Final  . Kappa, lamda light chain ratio 03/12/2018 1.78* 0.26 - 1.65 Final   Comment: (NOTE) Performed At: [facility], [place] [id number] Rush Farmer MD [id number] Performed at [facility], [place] [address], [place], [place] [address]   . IgG (Immunoglobin G), Serum 03/12/2018 2,787* 700 - 1,600 mg/dL Final  . IgA 03/12/2018 182  90 - 386 mg/dL Final  . IgM (Immunoglobulin M), Srm 03/12/2018 41  20 - 172 mg/dL Final  . Total Protein ELP 03/12/2018 7.7  6.0 - 8.5 g/dL Corrected  . Albumin SerPl Elph-Mcnc 03/12/2018 2.5* 2.9 - 4.4 g/dL Corrected  . Alpha 1 03/12/2018 0.5* 0.0 - 0.4 g/dL Corrected  . Alpha2 Glob SerPl Elph-Mcnc 03/12/2018 1.1* 0.4 - 1.0 g/dL Corrected  . B-Globulin SerPl Elph-Mcnc 03/12/2018 1.0  0.7 - 1.3 g/dL Corrected  . Gamma Glob SerPl Elph-Mcnc 03/12/2018 2.6* 0.4 - 1.8 g/dL Corrected  . M Protein SerPl Elph-Mcnc 03/12/2018 1.5* Not Observed g/dL Corrected  . Globulin, Total 03/12/2018 5.2* 2.2 - 3.9 g/dL Corrected  . Albumin/Glob SerPl 03/12/2018 0.5* 0.7 - 1.7 Corrected  . IFE 1 03/12/2018 Comment   Corrected   Comment: (NOTE) Immunofixation shows IgG monoclonal protein with kappa light chain specificity. Please note that samples from patients receiving DARZALEX(R) (daratumumab) treatment can appear as an "IgG kappa" and mask a complete response. If this patient is receiving DARA, this IFE assay  interference can be removed by ordering test number [id number]-"Immunofixation, Daratumumab-Specific, Serum" and submitting a new sample for testing or by calling the lab to add this test to the current sample.   . Please Note 03/12/2018 Comment   Corrected   Comment: (NOTE) Protein electrophoresis scan will follow via computer, mail, or courier delivery. Performed At: [facility] [place], [place] [id number] Rush Farmer MD [id number] Performed at [facility], [place] [address], [place], [place] [address]   . Beta-2 Microglobulin 03/12/2018 1.8  0.6 - 2.4 mg/L Final   Comment: (NOTE) Siemens Immulite 2000 Immunochemiluminometric assay (ICMA) Values obtained with different assay methods or kits cannot be used interchangeably. Results cannot be interpreted as absolute evidence of the presence or absence of malignant disease. Performed At: [facility] [place], [place] [id number] Rush Farmer MD [id number] Performed at [facility], [place] [address] [place],  [address]   . Sodium 03/12/2018 135* 136 - 145 mmol/L Final  . Potassium 03/12/2018 3.7  3.5 - 5.1 mmol/L Final  . Chloride 03/12/2018 98  98 - 109 mmol/L Final  . CO2 03/12/2018 29  22 - 29 mmol/L Final  . Glucose, Bld 03/12/2018 105  70 - 140 mg/dL Final  . BUN 03/12/2018 5* 7 - 26 mg/dL Final  . Creatinine 03/12/2018 0.79  0.70 - 1.30 mg/dL Final  . Calcium 03/12/2018 9.3  8.4 - 10.4 mg/dL Final  . Total Protein 03/12/2018 9.3* 6.4 - 8.3 g/dL Final  . Albumin 03/12/2018 2.9* 3.5 - 5.0 g/dL Final  . AST 03/12/2018 21  5 - 34 U/L Final  . ALT 03/12/2018 12  0 - 55 U/L Final  . Alkaline Phosphatase 03/12/2018 112  40 - 150 U/L Final  . Total Bilirubin 03/12/2018 0.2  0.2 - 1.2 mg/dL Final  . GFR, Est Non Af Am 03/12/2018 >60  >60 mL/min Final  . GFR, Est AFR Am 03/12/2018 >60  >60 mL/min Final   Comment: (NOTE) The  eGFR has been calculated using the CKD EPI equation. This calculation has not been validated in all clinical situations. eGFR's persistently <60 mL/min signify possible Chronic Kidney Disease.   Georgiann Hahn gap 03/12/2018 8  3 - 11 Final   Performed at [facility], [place] [address] [place], [place] [address]  . WBC Count 03/12/2018 10.1  4.0 - 10.3 K/uL Final  . RBC 03/12/2018 4.52  4.20 - 5.82 MIL/uL Final  . Hemoglobin 03/12/2018 11.1* 13.0 - 17.1 g/dL Final  . HCT 03/12/2018 34.7* 38.4 - 49.9 % Final  . MCV 03/12/2018 76.8* 79.3 - 98.0 fL Final  . MCH 03/12/2018 24.6* 27.2 - 33.4 pg Final  . MCHC 03/12/2018 32.0  32.0 - 36.0 g/dL Final  . RDW 03/12/2018 19.2* 11.0 - 14.6 % Final  . Platelet Count 03/12/2018 535* 140 - 400 K/uL Final  . Neutrophils Relative % 03/12/2018 76  % Final  . Neutro Abs 03/12/2018 7.8* 1.5 - 6.5 K/uL Final  . Lymphocytes Relative 03/12/2018 15  % Final  . Lymphs Abs 03/12/2018 1.6  0.9 - 3.3 K/uL Final  . Monocytes Relative 03/12/2018 7  % Final  . Monocytes Absolute 03/12/2018 0.7  0.1 - 0.9 K/uL Final  . Eosinophils Relative 03/12/2018 1  % Final  . Eosinophils Absolute 03/12/2018 0.1  0.0 - 0.5 K/uL Final  . Basophils Relative 03/12/2018 1  % Final  . Basophils Absolute 03/12/2018 0.1  0.0 - 0.1 K/uL Final   Performed at [facility], [place] [address] [place], [place] [address]       Ardath Sax, MD

## 2018-04-01 NOTE — Assessment & Plan Note (Signed)
Continue Adcirca. Notify office if you have any issues obtaining this medication Follow-up with Dr. [place] Bells in 6 weeks

## 2018-04-07 ENCOUNTER — Inpatient Hospital Stay: Payer: Self-pay | Attending: Hematology

## 2018-04-07 ENCOUNTER — Inpatient Hospital Stay: Payer: Self-pay

## 2018-04-07 ENCOUNTER — Other Ambulatory Visit: Payer: Self-pay | Admitting: Medical

## 2018-04-07 VITALS — BP 123/89 | HR 98 | Temp 98.2°F | Resp 16

## 2018-04-07 DIAGNOSIS — B029 Zoster without complications: Secondary | ICD-10-CM | POA: Insufficient documentation

## 2018-04-07 DIAGNOSIS — I272 Pulmonary hypertension, unspecified: Secondary | ICD-10-CM | POA: Insufficient documentation

## 2018-04-07 DIAGNOSIS — Z5112 Encounter for antineoplastic immunotherapy: Secondary | ICD-10-CM | POA: Insufficient documentation

## 2018-04-07 DIAGNOSIS — C9000 Multiple myeloma not having achieved remission: Secondary | ICD-10-CM | POA: Insufficient documentation

## 2018-04-07 LAB — CMP (CANCER CENTER ONLY)
ALBUMIN: 3 g/dL — AB (ref 3.5–5.0)
ALT: 20 U/L (ref 0–55)
AST: 30 U/L (ref 5–34)
Alkaline Phosphatase: 110 U/L (ref 40–150)
Anion gap: 10 (ref 3–11)
BUN: 11 mg/dL (ref 7–26)
CHLORIDE: 96 mmol/L — AB (ref 98–109)
CO2: 28 mmol/L (ref 22–29)
CREATININE: 0.75 mg/dL (ref 0.70–1.30)
Calcium: 9.1 mg/dL (ref 8.4–10.4)
GFR, Est AFR Am: >60 mL/min (ref >60)
GFR, Estimated: >60 mL/min (ref >60)
Glucose, Bld: 107 mg/dL (ref 70–140)
Potassium: 4.1 mmol/L (ref 3.5–5.1)
SODIUM: 134 mmol/L — AB (ref 136–145)
Total Bilirubin: <0.2 mg/dL — ABNORMAL LOW (ref 0.2–1.2)
Total Protein: 8.7 g/dL — ABNORMAL HIGH (ref 6.4–8.3)

## 2018-04-07 LAB — CBC WITH DIFFERENTIAL (CANCER CENTER ONLY)
Basophils Absolute: 0 10*3/uL (ref 0.0–0.1)
Basophils Relative: 0 %
EOS ABS: 0 10*3/uL (ref 0.0–0.5)
EOS PCT: 0 %
HCT: 33.2 % — ABNORMAL LOW (ref 38.4–49.9)
HEMOGLOBIN: 10.7 g/dL — AB (ref 13.0–17.1)
LYMPHS ABS: 1.3 10*3/uL (ref 0.9–3.3)
Lymphocytes Relative: 7 %
MCH: 23.7 pg — AB (ref 27.2–33.4)
MCHC: 32.2 g/dL (ref 32.0–36.0)
MCV: 73.5 fL — ABNORMAL LOW (ref 79.3–98.0)
MONOS PCT: 4 %
Monocytes Absolute: 0.8 10*3/uL (ref 0.1–0.9)
NEUTROS PCT: 89 %
Neutro Abs: 17.1 10*3/uL — ABNORMAL HIGH (ref 1.5–6.5)
Platelet Count: 637 10*3/uL — ABNORMAL HIGH (ref 140–400)
RBC: 4.52 MIL/uL (ref 4.20–5.82)
RDW: 18.4 % — ABNORMAL HIGH (ref 11.0–14.6)
WBC Count: 19.1 10*3/uL — ABNORMAL HIGH (ref 4.0–10.3)

## 2018-04-07 LAB — IRON AND TIBC
Iron: 34 ug/dL — ABNORMAL LOW (ref 42–163)
Saturation Ratios: 12 % — ABNORMAL LOW (ref 42–163)
TIBC: 278 ug/dL (ref 202–409)
UIBC: 243 ug/dL

## 2018-04-07 LAB — FERRITIN: Ferritin: 436 ng/mL — ABNORMAL HIGH (ref 22–316)

## 2018-04-07 LAB — LACTATE DEHYDROGENASE: LDH: 171 U/L (ref 125–245)

## 2018-04-07 MED ORDER — BORTEZOMIB CHEMO SQ INJECTION 3.5 MG (2.5MG/ML)
1.3000 mg/m2 | Freq: Once | INTRAMUSCULAR | Status: AC
  Administered 2018-04-07: 1.75 mg via SUBCUTANEOUS
  Filled 2018-04-07: qty 0.7

## 2018-04-07 MED ORDER — PROCHLORPERAZINE MALEATE 10 MG PO TABS
ORAL_TABLET | ORAL | Status: AC
  Filled 2018-04-07: qty 1

## 2018-04-07 MED ORDER — PROCHLORPERAZINE MALEATE 10 MG PO TABS
10.0000 mg | ORAL_TABLET | Freq: Once | ORAL | Status: AC
  Administered 2018-04-07: 10 mg via ORAL

## 2018-04-07 MED FILL — OMEPRAZOLE 40 MG CPDR: 40 | 30 days supply | Qty: 30 | Fill #0

## 2018-04-07 MED FILL — DEXAMETHASONE 4 MG TABLET: 4 | 28 days supply | Qty: 20 | Fill #1

## 2018-04-07 NOTE — Patient Instructions (Signed)
[facility] Discharge Instructions for Patients Receiving Chemotherapy  Today you received the following chemotherapy agents Velcade.  To help prevent nausea and vomiting after your treatment, we encourage you to take your nausea medication as directed.  If you develop nausea and vomiting that is not controlled by your nausea medication, call the clinic.   BELOW ARE SYMPTOMS THAT SHOULD BE REPORTED IMMEDIATELY:  *FEVER GREATER THAN 100.5 F  *CHILLS WITH OR WITHOUT FEVER  NAUSEA AND VOMITING THAT IS NOT CONTROLLED WITH YOUR NAUSEA MEDICATION  *UNUSUAL SHORTNESS OF BREATH  *UNUSUAL BRUISING OR BLEEDING  TENDERNESS IN MOUTH AND THROAT WITH OR WITHOUT PRESENCE OF ULCERS  *URINARY PROBLEMS  *BOWEL PROBLEMS  UNUSUAL RASH Items with * indicate a potential emergency and should be followed up as soon as possible.  Feel free to call the clinic should you have any questions or concerns. The clinic phone number is [phone].  Please show the CHEMO ALERT CARD at check-in to the Emergency Department and triage nurse.   

## 2018-04-08 LAB — KAPPA/LAMBDA LIGHT CHAINS
KAPPA FREE LGHT CHN: 25.2 mg/L — AB (ref 3.3–19.4)
Kappa, lambda light chain ratio: 2.27 — ABNORMAL HIGH (ref 0.26–1.65)
LAMDA FREE LIGHT CHAINS: 11.1 mg/L (ref 5.7–26.3)

## 2018-04-08 LAB — BETA 2 MICROGLOBULIN, SERUM: Beta-2 Microglobulin: 1.4 mg/L (ref 0.6–2.4)

## 2018-04-09 LAB — MULTIPLE MYELOMA PANEL, SERUM
ALBUMIN SERPL ELPH-MCNC: 2.8 g/dL — AB (ref 2.9–4.4)
ALPHA 1: 0.5 g/dL — AB (ref 0.0–0.4)
Albumin/Glob SerPl: 0.6 — ABNORMAL LOW (ref 0.7–1.7)
Alpha2 Glob SerPl Elph-Mcnc: 1.1 g/dL — ABNORMAL HIGH (ref 0.4–1.0)
B-Globulin SerPl Elph-Mcnc: 1.2 g/dL (ref 0.7–1.3)
Gamma Glob SerPl Elph-Mcnc: 2.2 g/dL — ABNORMAL HIGH (ref 0.4–1.8)
Globulin, Total: 5 g/dL — ABNORMAL HIGH (ref 2.2–3.9)
IGA: 121 mg/dL (ref 90–386)
IGM (IMMUNOGLOBULIN M), SRM: 36 mg/dL (ref 20–172)
IgG (Immunoglobin G), Serum: 2301 mg/dL — ABNORMAL HIGH (ref 700–1600)
M Protein SerPl Elph-Mcnc: 1.5 g/dL — ABNORMAL HIGH
TOTAL PROTEIN ELP: 7.8 g/dL (ref 6.0–8.5)

## 2018-04-13 ENCOUNTER — Telehealth: Payer: Self-pay | Admitting: Pulmonary Disease

## 2018-04-13 NOTE — Progress Notes (Signed)
HEMATOLOGY/ONCOLOGY CONSULTATION NOTE  Date of Service: 04/14/2018  Patient Care Team: [name], PA-C as PCP - General (Physician Assistant) Michel Bickers, MD as Consulting Physician (Infectious Diseases) Brand Males, MD as Consulting Physician (Pulmonary Disease)  CHIEF COMPLAINTS/PURPOSE OF CONSULTATION:  Multiple myeloma  Oncologic History:  57 y.o.  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  Based on the diagnosis, patient was started on treatment with bortezomib & low-dose dexamethasone.  Initially, lenalidomide was omitted out of concern for high risk of for infectious complications.  We have continued to therapy with just a doublet treatment and did not reintensify the therapy.  Treatment course has been complicated by exacerbation of pulmonary hypertension and now with shingles reactivation resulting in blistering rash and skin ulcerations on the left flank.  Infection appears to be responding to higher dose of acyclovir and skin is healing.   HISTORY OF PRESENTING ILLNESS:   [name] [name] is a wonderful 57 y.o. male who has been referred to [place] by my colleague Dr [name] for evaluation and management of Multiple myeloma. He is accompanied today by a Optometrist. The pt reports that he is doing well overall.   The pt has been followed by my colleague Dr [name] and has been treated with Velcade and dexamethasone.    The pt reports that he has lost some weight and hasn't eaten as much because he hasn't been able to taste his food as well.   He notes that his shingles rash has continued to clear up and he denies pain associated with his remaining rash. His back pain continues and he has run out of his Hydromorphone. He notes that his back  pain has not gotten better since he was first evaluated. He notes that he feels tired and has a little SOB.   Most recent lab results (04/07/18) of CBC and CMP is as follows: all values are WNL except for Sodium at 134, chloride at 96, Total Protein at 8.7, Albumin at 3.0, Total bilirubin <0.2, WBC at 19.1k, HGB at 10.7, HCT at 33.2, MCV at 73.5, MCH at 23.7, RDW at 18.4, Platelets at 637k, ANC at 17.1k.Marland Kitchen MMP 04/07/18 showed IgG elevated at 2301 and M Protein at 1.5. Ferritin 04/07/18 was elevated at 436 Iron/TIBC 04/07/18 showed at 12% saturation ratio LDH 04/07/18 is WNL at 171 Kappa/Lambda 04/07/18 showed Kappa at 25.2 and K:L ratio at 2.27  On review of systems, pt reports some SOB, back pain, resolving shingles rash, mouth sores, weight loss, and denies fevers, chills, new skin rashes, and any other symptoms.   MEDICAL HISTORY:  Past Medical History:  Diagnosis Date  . Acute on chronic respiratory failure ([place]) 02/07/2018  . Hypertension   . Kidney stones   . Pulmonary hypertension ([place])     SURGICAL HISTORY: Past Surgical History:  Procedure Laterality Date  . CARDIAC CATHETERIZATION N/A 01/18/2016   Procedure: Right Heart Cath;  Surgeon: Jolaine Artist, MD;  Location: [place] [facility];  Service: Cardiovascular;  Laterality: N/A;  . CARDIAC CATHETERIZATION N/A 06/24/2016   Procedure: Right Heart Cath;  Surgeon: Jolaine Artist, MD;  Location: [place] [facility];  Service: Cardiovascular;  Laterality: N/A;  . KIDNEY SURGERY    . VIDEO ASSISTED THORACOSCOPY (VATS)/DECORTICATION Left 2003  .  VIDEO ASSISTED THORACOSCOPY (VATS)/DECORTICATION Left 10/02/2015   Procedure: VIDEO ASSISTED THORACOSCOPY (VATS)/DECORTICATION and drainage of chronic empyena;  Surgeon: [name], MD;  Location: [place];  Service: Thoracic;  Laterality: Left;  Marland Kitchen VIDEO BRONCHOSCOPY N/A 10/02/2015   Procedure: VIDEO BRONCHOSCOPY;  Surgeon: [name], MD;  Location: [facility];  Service: Thoracic;  Laterality:  N/A;    SOCIAL HISTORY: Social History   Socioeconomic History  . Marital status: Single    Spouse name: Not on file  . Number of children: Not on file  . Years of education: Not on file  . Highest education level: Not on file  Occupational History  . Not on file  Social Needs  . Financial resource strain: Not on file  . Food insecurity:    Worry: Not on file    Inability: Not on file  . Transportation needs:    Medical: Not on file    Non-medical: Not on file  Tobacco Use  . Smoking status: Never Smoker  . Smokeless tobacco: Never Used  Substance and Sexual Activity  . Alcohol use: Yes  . Drug use: No  . Sexual activity: Not on file  Lifestyle  . Physical activity:    Days per week: Not on file    Minutes per session: Not on file  . Stress: Not on file  Relationships  . Social connections:    Talks on phone: Not on file    Gets together: Not on file    Attends religious service: Not on file    Active member of club or organization: Not on file    Attends meetings of clubs or organizations: Not on file    Relationship status: Not on file  . Intimate partner violence:    Fear of current or ex partner: Not on file    Emotionally abused: Not on file    Physically abused: Not on file    Forced sexual activity: Not on file  Other Topics Concern  . Not on file  Social History Narrative  . Not on file    FAMILY HISTORY: Family History  Problem Relation Age of Onset  . Hypertension Other     ALLERGIES:  has No Known Allergies.  MEDICATIONS:  Current Outpatient Medications  Medication Sig Dispense Refill  . acyclovir (ZOVIRAX) 400 MG tablet Take 1 tablet (400 mg total) by mouth 2 (two) times daily. 60 tablet 3  . albuterol (PROVENTIL HFA;VENTOLIN HFA) 108 (90 Base) MCG/ACT inhaler Inhale 2 puffs into the lungs every 6 (six) hours as needed for wheezing or shortness of breath. 1 Inhaler 5  . dexamethasone (DECADRON) 4 MG tablet Take 5 tablets (20 mg total) by  mouth once a week for 12 doses. 20 tablet 2  . docusate sodium (COLACE) 50 MG capsule Take 1 capsule (50 mg total) by mouth daily. 30 capsule 0  . ferrous sulfate 325 (65 FE) MG EC tablet Take 1 tablet (325 mg total) by mouth 3 (three) times daily with meals. 90 tablet 0  . HYDROmorphone (DILAUDID) 2 MG tablet Take 1 tablet (2 mg total) by mouth every 4 (four) hours as needed for severe pain. 50 tablet 0  . nortriptyline (PAMELOR) 25 MG capsule Take 1 capsule (25 mg total) by mouth at bedtime. 30 capsule 3  . omeprazole (PRILOSEC) 40 MG capsule Take 1 capsule (40 mg total) by mouth daily. 30 capsule 3  . OXYGEN 2lpm BEDTIME ONLY    . tadalafil, PAH, (ADCIRCA) 20  MG tablet Take 2 tablets (40 mg total) by mouth daily. (Patient taking differently: Take 20 mg by mouth daily. ) 60 tablet 11  . umeclidinium-vilanterol (ANORO ELLIPTA) 62.5-25 MCG/INH AEPB Inhale 1 puff into the lungs daily. 60 each 5  . umeclidinium-vilanterol (ANORO ELLIPTA) 62.5-25 MCG/INH AEPB Inhale 1 puff into the lungs daily. 1 each 0   No current facility-administered medications for this visit.     REVIEW OF SYSTEMS:    10 Point review of Systems was done is negative except as noted above.  PHYSICAL EXAMINATION: ECOG PERFORMANCE STATUS: 2 - Symptomatic, <50% confined to bed  . Vitals:   04/14/18 1458  BP: 132/88  Pulse: 93  Resp: 19  Temp: 98 F (36.7 C)  SpO2: 93%   Filed Weights   04/14/18 1458  Weight: 82 lb 14.4 oz (37.6 kg)   .Body mass index is 16.19 kg/m.  GENERAL:alert, in no acute distress and comfortable SKIN: no acute rashes, no significant lesions EYES: conjunctiva are pink and non-injected, sclera anicteric OROPHARYNX: MMM, no exudates, no oropharyngeal erythema or ulceration NECK: supple, no JVD LYMPH:  no palpable lymphadenopathy in the cervical, axillary or inguinal regions LUNGS: clear to auscultation b/l with normal respiratory effort HEART: regular rate & rhythm ABDOMEN:  normoactive  bowel sounds , non tender, not distended. Extremity: no pedal edema PSYCH: alert & oriented x 3 with fluent speech NEURO: no focal motor/sensory deficits  LABORATORY DATA:  I have reviewed the data as listed  . CBC Latest Ref Rng & Units 04/07/2018 03/31/2018 03/18/2018  WBC 4.0 - 10.3 K/uL 19.1(H) 16.0(H) 10.6(H)  Hemoglobin 13.0 - 17.1 g/dL 10.7(L) 9.7(L) 11.5(L)  Hematocrit 38.4 - 49.9 % 33.2(L) 31.0(L) 35.9(L)  Platelets 140 - 400 K/uL 637(H) 644(H) 626(H)    . CMP Latest Ref Rng & Units 04/14/2018 04/07/2018 03/31/2018  Glucose 70 - 140 mg/dL 147(H) 107 186(H)  BUN 7 - 26 mg/dL '16 11 17  '$ Creatinine 0.70 - 1.30 mg/dL 0.76 0.75 0.72  Sodium 136 - 145 mmol/L 134(L) 134(L) 131(L)  Potassium 3.5 - 5.1 mmol/L 4.4 4.1 4.4  Chloride 98 - 109 mmol/L 94(L) 96(L) 96(L)  CO2 22 - 29 mmol/L 30(H) 28 25  Calcium 8.4 - 10.4 mg/dL 10.6(H) 9.1 9.0  Total Protein 6.4 - 8.3 g/dL - 8.7(H) 8.0  Total Bilirubin 0.2 - 1.2 mg/dL - <0.2(L) <0.2(L)  Alkaline Phos 40 - 150 U/L - 110 87  AST 5 - 34 U/L - 30 30  ALT 0 - 55 U/L - 20 21   03/18/18 Cytogenetics:  03/18/18 Pathology:  11/03/17 Cytogenetics:    RADIOGRAPHIC STUDIES: I have personally reviewed the radiological images as listed and agreed with the findings in the report. Dg Chest 2 View  Result Date: 04/01/2018 CLINICAL DATA:  57 year old male with chronic lung disease, recurrent empyema. Multiple myeloma. EXAM: CHEST - 2 VIEW COMPARISON:  Chest radiographs 03/05/2018 and earlier. FINDINGS: Stable lung volumes. Stable cardiac size and mediastinal contours. Moderate size partially calcified left lower pleural collection is stable. Superimposed bilateral perihilar architectural distortion. No superimposed pneumothorax, pulmonary edema, or acute pulmonary opacity. Visualized tracheal air column is within normal limits. No acute osseous abnormality identified. Negative visible bowel gas pattern. IMPRESSION: Stable chronic lung disease.  No acute  cardiopulmonary abnormality. Electronically Signed   By: Genevie Ann M.D.   On: 04/01/2018 13:18   Ct Biopsy  Result Date: 03/18/2018 CLINICAL DATA:  Follow-up multiple myeloma EXAM: CT GUIDED DEEP ILIAC BONE ASPIRATION AND  CORE BIOPSY TECHNIQUE: Patient was placed supine on the CT gantry and limited axial scans through the pelvis were obtained. Appropriate skin entry site was identified. Skin site was marked, prepped with chlorhexidine, draped in usual sterile fashion, and infiltrated locally with 1% lidocaine. Intravenous Fentanyl and Versed were administered as conscious sedation during continuous monitoring of the patient's level of consciousness and physiological / cardiorespiratory status by the radiology RN, with a total moderate sedation time of 11 minutes. Under CT fluoroscopic guidance an 11-gauge Cook trocar bone needle was advanced into the right iliac bone just lateral to the sacroiliac joint. Once needle tip position was confirmed, core and aspiration samples were obtained, submitted to pathology for approval. Post procedure scans show no hematoma or fracture. Patient tolerated procedure well. COMPLICATIONS: COMPLICATIONS none IMPRESSION: 1. Technically successful CT guided right iliac bone core and aspiration biopsy. Electronically Signed   By: Lucrezia Europe M.D.   On: 03/18/2018 12:48   Ct Bone Marrow Biopsy & Aspiration  Result Date: 03/18/2018 CLINICAL DATA:  Follow-up multiple myeloma EXAM: CT GUIDED DEEP ILIAC BONE ASPIRATION AND CORE BIOPSY TECHNIQUE: Patient was placed supine on the CT gantry and limited axial scans through the pelvis were obtained. Appropriate skin entry site was identified. Skin site was marked, prepped with chlorhexidine, draped in usual sterile fashion, and infiltrated locally with 1% lidocaine. Intravenous Fentanyl and Versed were administered as conscious sedation during continuous monitoring of the patient's level of consciousness and physiological / cardiorespiratory  status by the radiology RN, with a total moderate sedation time of 11 minutes. Under CT fluoroscopic guidance an 11-gauge Cook trocar bone needle was advanced into the right iliac bone just lateral to the sacroiliac joint. Once needle tip position was confirmed, core and aspiration samples were obtained, submitted to pathology for approval. Post procedure scans show no hematoma or fracture. Patient tolerated procedure well. COMPLICATIONS: COMPLICATIONS none IMPRESSION: 1. Technically successful CT guided right iliac bone core and aspiration biopsy. Electronically Signed   By: Lucrezia Europe M.D.   On: 03/18/2018 12:48    ASSESSMENT & PLAN:  57 y.o. male with  1. 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  Based on the diagnosis, patient was started on treatment with bortezomib & low-dose dexamethasone.  Initially, lenalidomide was omitted out of concern for high risk of for infectious complications.  We have continued to therapy with just a doublet treatment and did not reintensify the therapy.  Treatment course has been complicated by exacerbation of pulmonary hypertension and now with shingles reactivation  PLAN -Discussed patient's most recent labs from 04/14/18, Hgb at 10.7, M Protein at 1.5 -Repeat BM 03/18/18 shows limited response with still 18% plasma cell in the bone marrow. Cytogenetics shows  14.5% [id number] -Continue taking Acyclovir forshingles prophylaxis -Discussed that we will consider changing treatment if his M protein level does not response further. -Discussed referring pt to IR for discussion about injecting bone cement to L4. Pt would like to wait on this for now.  -Contact Dr Lake Bells in Pulmonology for refill of Tadalafil -Will see pt back in 3 weeks    Continue treatment as per orders (on Velcade/Dex) RTC  with Dr Irene Limbo in 3 weeks    All of the patients questions were answered with apparent satisfaction. The patient knows to call the clinic with any problems, questions or  concerns.  The toal time spent in the appt was 25 minutes and more than 50% was on counseling and direct patient cares.   Sullivan Lone MD MS AAHIVMS [facility] Hematology/Oncology Physician [facility]  (Office):       [phone] (Work cell):  [phone] (Fax):           [phone]  04/14/2018 3:54 PM  I, Baldwin Jamaica, am acting as a Education administrator for Dr Irene Limbo.   .I have reviewed the above documentation for accuracy and completeness, and I agree with the above. Brunetta Genera MD

## 2018-04-13 NOTE — Telephone Encounter (Signed)
Pt care giver in lobby waiting ion samples.[name] [name]

## 2018-04-13 NOTE — Telephone Encounter (Signed)
Spoke with Anderson Malta in the lobby and provided samples of Anoro. Nothing further is needed.

## 2018-04-14 ENCOUNTER — Inpatient Hospital Stay (HOSPITAL_BASED_OUTPATIENT_CLINIC_OR_DEPARTMENT_OTHER): Payer: Self-pay | Admitting: Hematology

## 2018-04-14 ENCOUNTER — Other Ambulatory Visit: Payer: Self-pay | Admitting: Medical

## 2018-04-14 ENCOUNTER — Inpatient Hospital Stay: Payer: Self-pay

## 2018-04-14 VITALS — BP 132/88 | HR 93 | Temp 98.0°F | Resp 19 | Ht 60.0 in | Wt 82.9 lb

## 2018-04-14 DIAGNOSIS — Z7189 Other specified counseling: Secondary | ICD-10-CM

## 2018-04-14 DIAGNOSIS — C9000 Multiple myeloma not having achieved remission: Secondary | ICD-10-CM

## 2018-04-14 DIAGNOSIS — B029 Zoster without complications: Secondary | ICD-10-CM

## 2018-04-14 DIAGNOSIS — I272 Pulmonary hypertension, unspecified: Secondary | ICD-10-CM

## 2018-04-14 DIAGNOSIS — Z5111 Encounter for antineoplastic chemotherapy: Secondary | ICD-10-CM

## 2018-04-14 LAB — BASIC METABOLIC PANEL
Anion gap: 10 (ref 3–11)
BUN: 16 mg/dL (ref 7–26)
CALCIUM: 10.6 mg/dL — AB (ref 8.4–10.4)
CO2: 30 mmol/L — ABNORMAL HIGH (ref 22–29)
CREATININE: 0.76 mg/dL (ref 0.70–1.30)
Chloride: 94 mmol/L — ABNORMAL LOW (ref 98–109)
GLUCOSE: 147 mg/dL — AB (ref 70–140)
Potassium: 4.4 mmol/L (ref 3.5–5.1)
Sodium: 134 mmol/L — ABNORMAL LOW (ref 136–145)

## 2018-04-14 MED ORDER — PROCHLORPERAZINE MALEATE 10 MG PO TABS
ORAL_TABLET | ORAL | Status: AC
  Filled 2018-04-14: qty 1

## 2018-04-14 MED ORDER — ZOLEDRONIC ACID 4 MG/5ML IV CONC
3.5000 mg | Freq: Once | INTRAVENOUS | Status: AC
  Administered 2018-04-14: 3.5 mg via INTRAVENOUS
  Filled 2018-04-14: qty 4.38

## 2018-04-14 MED ORDER — BORTEZOMIB CHEMO SQ INJECTION 3.5 MG (2.5MG/ML)
1.3000 mg/m2 | Freq: Once | INTRAMUSCULAR | Status: AC
  Administered 2018-04-14: 1.75 mg via SUBCUTANEOUS
  Filled 2018-04-14: qty 0.7

## 2018-04-14 MED ORDER — PROCHLORPERAZINE MALEATE 10 MG PO TABS
10.0000 mg | ORAL_TABLET | Freq: Once | ORAL | Status: AC
  Administered 2018-04-14: 10 mg via ORAL

## 2018-04-14 MED ORDER — HYDROMORPHONE HCL 2 MG PO TABS: 2.0000 mg | ORAL_TABLET | ORAL | 0 refills | Status: DC | PRN

## 2018-04-14 MED FILL — HYDROmorphone HCL 2 MG TABS: 2 | 9 days supply | Qty: 50 | Fill #0

## 2018-04-14 NOTE — Patient Instructions (Signed)
[facility] Discharge Instructions for Patients Receiving Chemotherapy  Today you received the following chemotherapy agents bortezomib (Velcade) and zolendric acid (Zometa)  To help prevent nausea and vomiting after your treatment, we encourage you to take your nausea medication as directed.    If you develop nausea and vomiting that is not controlled by your nausea medication, call the clinic.   BELOW ARE SYMPTOMS THAT SHOULD BE REPORTED IMMEDIATELY:  *FEVER GREATER THAN 100.5 F  *CHILLS WITH OR WITHOUT FEVER  NAUSEA AND VOMITING THAT IS NOT CONTROLLED WITH YOUR NAUSEA MEDICATION  *UNUSUAL SHORTNESS OF BREATH  *UNUSUAL BRUISING OR BLEEDING  TENDERNESS IN MOUTH AND THROAT WITH OR WITHOUT PRESENCE OF ULCERS  *URINARY PROBLEMS  *BOWEL PROBLEMS  UNUSUAL RASH Items with * indicate a potential emergency and should be followed up as soon as possible.  Feel free to call the clinic should you have any questions or concerns. The clinic phone number is [phone].  Please show the CHEMO ALERT CARD at check-in to the Emergency Department and triage nurse.   

## 2018-04-14 NOTE — Progress Notes (Signed)
Ok to use CBC and CMET from 04/07/18 and BMET from today per Dr. Irene Limbo. OK to treat.    Hardie Pulley, PharmD, BCPS, BCOP

## 2018-04-14 NOTE — Progress Notes (Signed)
Per Dr. Irene Limbo ok to use CBC from 04/07/18.

## 2018-04-15 ENCOUNTER — Ambulatory Visit (INDEPENDENT_AMBULATORY_CARE_PROVIDER_SITE_OTHER): Payer: Self-pay | Admitting: Physician Assistant

## 2018-04-21 ENCOUNTER — Other Ambulatory Visit: Payer: Self-pay | Admitting: Hematology

## 2018-04-21 ENCOUNTER — Inpatient Hospital Stay: Payer: Self-pay

## 2018-04-21 VITALS — BP 122/81 | HR 90 | Temp 98.0°F | Resp 18

## 2018-04-21 DIAGNOSIS — C9000 Multiple myeloma not having achieved remission: Secondary | ICD-10-CM

## 2018-04-21 LAB — COMPREHENSIVE METABOLIC PANEL
ALK PHOS: 94 U/L (ref 40–150)
ALT: 12 U/L (ref 0–55)
ANION GAP: 9 (ref 3–11)
AST: 22 U/L (ref 5–34)
Albumin: 2.9 g/dL — ABNORMAL LOW (ref 3.5–5.0)
BUN: 15 mg/dL (ref 7–26)
CALCIUM: 9.4 mg/dL (ref 8.4–10.4)
CO2: 26 mmol/L (ref 22–29)
Chloride: 95 mmol/L — ABNORMAL LOW (ref 98–109)
Creatinine, Ser: 0.89 mg/dL (ref 0.70–1.30)
GFR calc non Af Amer: >60 mL/min (ref >60)
Glucose, Bld: 165 mg/dL — ABNORMAL HIGH (ref 70–140)
Potassium: 4.7 mmol/L (ref 3.5–5.1)
SODIUM: 130 mmol/L — AB (ref 136–145)
Total Bilirubin: <0.2 mg/dL — ABNORMAL LOW (ref 0.2–1.2)
Total Protein: 7.9 g/dL (ref 6.4–8.3)

## 2018-04-21 LAB — CBC WITH DIFFERENTIAL/PLATELET
BASOS ABS: 0.1 10*3/uL (ref 0.0–0.1)
BASOS PCT: 0 %
Eosinophils Absolute: 0 10*3/uL (ref 0.0–0.5)
Eosinophils Relative: 0 %
HEMATOCRIT: 33.2 % — AB (ref 38.4–49.9)
HEMOGLOBIN: 10.5 g/dL — AB (ref 13.0–17.1)
Lymphocytes Relative: 8 %
Lymphs Abs: 1 10*3/uL (ref 0.9–3.3)
MCH: 23 pg — ABNORMAL LOW (ref 27.2–33.4)
MCHC: 31.7 g/dL — AB (ref 32.0–36.0)
MCV: 72.5 fL — ABNORMAL LOW (ref 79.3–98.0)
MONOS PCT: 4 %
Monocytes Absolute: 0.6 10*3/uL (ref 0.1–0.9)
NEUTROS ABS: 10.8 10*3/uL — AB (ref 1.5–6.5)
NEUTROS PCT: 88 %
Platelets: 634 10*3/uL — ABNORMAL HIGH (ref 140–400)
RBC: 4.58 MIL/uL (ref 4.20–5.82)
RDW: 20.2 % — ABNORMAL HIGH (ref 11.0–14.6)
WBC: 12.4 10*3/uL — AB (ref 4.0–10.3)

## 2018-04-21 MED ORDER — PROCHLORPERAZINE MALEATE 10 MG PO TABS
10.0000 mg | ORAL_TABLET | Freq: Once | ORAL | Status: AC
  Administered 2018-04-21: 10 mg via ORAL

## 2018-04-21 MED ORDER — BORTEZOMIB CHEMO SQ INJECTION 3.5 MG (2.5MG/ML)
1.3000 mg/m2 | Freq: Once | INTRAMUSCULAR | Status: AC
  Administered 2018-04-21: 1.75 mg via SUBCUTANEOUS
  Filled 2018-04-21: qty 0.7

## 2018-04-21 MED ORDER — PROCHLORPERAZINE MALEATE 10 MG PO TABS
ORAL_TABLET | ORAL | Status: AC
  Filled 2018-04-21: qty 1

## 2018-04-21 NOTE — Patient Instructions (Signed)
[facility] Discharge Instructions for Patients Receiving Chemotherapy  Today you received the following chemotherapy agents Velcade.  To help prevent nausea and vomiting after your treatment, we encourage you to take your nausea medication as directed.  If you develop nausea and vomiting that is not controlled by your nausea medication, call the clinic.   BELOW ARE SYMPTOMS THAT SHOULD BE REPORTED IMMEDIATELY:  *FEVER GREATER THAN 100.5 F  *CHILLS WITH OR WITHOUT FEVER  NAUSEA AND VOMITING THAT IS NOT CONTROLLED WITH YOUR NAUSEA MEDICATION  *UNUSUAL SHORTNESS OF BREATH  *UNUSUAL BRUISING OR BLEEDING  TENDERNESS IN MOUTH AND THROAT WITH OR WITHOUT PRESENCE OF ULCERS  *URINARY PROBLEMS  *BOWEL PROBLEMS  UNUSUAL RASH Items with * indicate a potential emergency and should be followed up as soon as possible.  Feel free to call the clinic should you have any questions or concerns. The clinic phone number is [phone].  Please show the CHEMO ALERT CARD at check-in to the Emergency Department and triage nurse.   

## 2018-04-22 MED FILL — NORTRIPTYLINE HCL 25 MG CAP: 25 | 30 days supply | Qty: 30 | Fill #1

## 2018-04-22 MED FILL — ACYCLOVIR 400 MG TABLET: 400 | 30 days supply | Qty: 60 | Fill #1

## 2018-05-05 ENCOUNTER — Telehealth: Payer: Self-pay | Admitting: Hematology

## 2018-05-05 NOTE — Telephone Encounter (Signed)
alled and spoke with jennifer regarding moving appointment from 7/3 to 7/5

## 2018-05-06 ENCOUNTER — Other Ambulatory Visit: Payer: Self-pay

## 2018-05-07 ENCOUNTER — Inpatient Hospital Stay: Payer: Self-pay

## 2018-05-08 ENCOUNTER — Inpatient Hospital Stay: Payer: Self-pay | Attending: Hematology

## 2018-05-08 ENCOUNTER — Ambulatory Visit (INDEPENDENT_AMBULATORY_CARE_PROVIDER_SITE_OTHER): Payer: Self-pay | Admitting: Physician Assistant

## 2018-05-08 ENCOUNTER — Other Ambulatory Visit: Payer: Self-pay

## 2018-05-08 ENCOUNTER — Encounter (INDEPENDENT_AMBULATORY_CARE_PROVIDER_SITE_OTHER): Payer: Self-pay | Admitting: Physician Assistant

## 2018-05-08 VITALS — BP 121/84 | HR 94 | Temp 97.6°F | Ht 60.0 in | Wt 86.2 lb

## 2018-05-08 DIAGNOSIS — Z1211 Encounter for screening for malignant neoplasm of colon: Secondary | ICD-10-CM

## 2018-05-08 DIAGNOSIS — Z23 Encounter for immunization: Secondary | ICD-10-CM

## 2018-05-08 DIAGNOSIS — Z0000 Encounter for general adult medical examination without abnormal findings: Secondary | ICD-10-CM

## 2018-05-08 DIAGNOSIS — C9000 Multiple myeloma not having achieved remission: Secondary | ICD-10-CM | POA: Insufficient documentation

## 2018-05-08 DIAGNOSIS — Z5112 Encounter for antineoplastic immunotherapy: Secondary | ICD-10-CM | POA: Insufficient documentation

## 2018-05-08 DIAGNOSIS — Z125 Encounter for screening for malignant neoplasm of prostate: Secondary | ICD-10-CM

## 2018-05-08 DIAGNOSIS — K219 Gastro-esophageal reflux disease without esophagitis: Secondary | ICD-10-CM

## 2018-05-08 LAB — COMPREHENSIVE METABOLIC PANEL
ALK PHOS: 107 U/L (ref 38–126)
ALT: 18 U/L (ref 0–44)
AST: 20 U/L (ref 15–41)
Albumin: 3 g/dL — ABNORMAL LOW (ref 3.5–5.0)
Anion gap: 8 (ref 5–15)
BUN: 10 mg/dL (ref 6–20)
CALCIUM: 9.2 mg/dL (ref 8.9–10.3)
CO2: 29 mmol/L (ref 22–32)
Chloride: 97 mmol/L — ABNORMAL LOW (ref 98–111)
Creatinine, Ser: 0.82 mg/dL (ref 0.61–1.24)
GFR calc non Af Amer: >60 mL/min (ref >60)
Glucose, Bld: 106 mg/dL — ABNORMAL HIGH (ref 70–99)
Potassium: 4.6 mmol/L (ref 3.5–5.1)
SODIUM: 134 mmol/L — AB (ref 135–145)
Total Bilirubin: <0.2 mg/dL — ABNORMAL LOW (ref 0.3–1.2)
Total Protein: 7.3 g/dL (ref 6.5–8.1)

## 2018-05-08 LAB — CBC WITH DIFFERENTIAL/PLATELET
Basophils Absolute: 0.2 10*3/uL — ABNORMAL HIGH (ref 0.0–0.1)
Basophils Relative: 1 %
EOS ABS: 0.4 10*3/uL (ref 0.0–0.5)
Eosinophils Relative: 3 %
HEMATOCRIT: 34.9 % — AB (ref 38.4–49.9)
HEMOGLOBIN: 11 g/dL — AB (ref 13.0–17.1)
LYMPHS ABS: 1.8 10*3/uL (ref 0.9–3.3)
LYMPHS PCT: 15 %
MCH: 23 pg — AB (ref 27.2–33.4)
MCHC: 31.6 g/dL — ABNORMAL LOW (ref 32.0–36.0)
MCV: 72.9 fL — AB (ref 79.3–98.0)
Monocytes Absolute: 1.3 10*3/uL — ABNORMAL HIGH (ref 0.1–0.9)
Monocytes Relative: 11 %
NEUTROS PCT: 70 %
Neutro Abs: 8.7 10*3/uL — ABNORMAL HIGH (ref 1.5–6.5)
Platelets: 569 10*3/uL — ABNORMAL HIGH (ref 140–400)
RBC: 4.78 MIL/uL (ref 4.20–5.82)
RDW: 19.6 % — ABNORMAL HIGH (ref 11.0–14.6)
WBC: 12.3 10*3/uL — AB (ref 4.0–10.3)

## 2018-05-08 MED ORDER — OMEPRAZOLE 40 MG PO CPDR: 40.0000 mg | DELAYED_RELEASE_CAPSULE | Freq: Every day | ORAL | 5 refills | Status: DC

## 2018-05-08 MED FILL — OMEPRAZOLE 40 MG CPDR: 40 | 30 days supply | Qty: 30 | Fill #0

## 2018-05-08 NOTE — Patient Instructions (Signed)
Food Choices for Gastroesophageal Reflux Disease, Adult When you have gastroesophageal reflux disease (GERD), the foods you eat and your eating habits are very important. Choosing the right foods can help ease your discomfort. What guidelines do I need to follow?  Choose fruits, vegetables, whole grains, and low-fat dairy products.  Choose low-fat meat, fish, and poultry.  Limit fats such as oils, salad dressings, butter, nuts, and avocado.  Keep a food diary. This helps you identify foods that cause symptoms.  Avoid foods that cause symptoms. These may be different for everyone.  Eat small meals often instead of 3 large meals a day.  Eat your meals slowly, in a place where you are relaxed.  Limit fried foods.  Cook foods using methods other than frying.  Avoid drinking alcohol.  Avoid drinking large amounts of liquids with your meals.  Avoid bending over or lying down until 2-3 hours after eating. What foods are not recommended? These are some foods and drinks that may make your symptoms worse: Vegetables  Tomatoes. Tomato juice. Tomato and spaghetti sauce. Chili peppers. Onion and garlic. Horseradish. Fruits  Oranges, grapefruit, and lemon (fruit and juice). Meats  High-fat meats, fish, and poultry. This includes hot dogs, ribs, ham, sausage, salami, and bacon. Dairy  Whole milk and chocolate milk. Sour cream. Cream. Butter. Ice cream. Cream cheese. Drinks  Coffee and tea. Bubbly (carbonated) drinks or energy drinks. Condiments  Hot sauce. Barbecue sauce. Sweets/Desserts  Chocolate and cocoa. Donuts. Peppermint and spearmint. Fats and Oils  High-fat foods. This includes French fries and potato chips. Other  Vinegar. Strong spices. This includes black pepper, white pepper, red pepper, cayenne, curry powder, cloves, ginger, and chili powder. The items listed above may not be a complete list of foods and drinks to avoid. Contact your dietitian for more information.    This information is not intended to replace advice given to you by your health care provider. Make sure you discuss any questions you have with your health care provider. Document Released: 04/21/2012 Document Revised: 03/28/2016 Document Reviewed: 08/25/2013 Elsevier Interactive Patient Education  2017 Elsevier Inc.  

## 2018-05-08 NOTE — Progress Notes (Signed)
Subjective:  Patient ID: [name] [name], male    DOB: September 15, 1961  Age: 57 y.o. MRN: [id number]  CC: annual exam  HPI [name] [name] is a 57 y.o. male with a medical history of multiple myeloma, COPD, HTN, CHF, pulmonary hypertension, GERD, kidney stones, and acute on chronic respiratory failure presents for an annual physical exam. Still has occasional epigastric pain which is relieved with omeprazole. H pylori negative. Endorses mild fatigue due to MM. Does not endorse CP, palpitations, SOB, HA, f/c/n/v, rash, or GI/GU sxs.      Outpatient Medications Prior to Visit  Medication Sig Dispense Refill  . albuterol (PROVENTIL HFA;VENTOLIN HFA) 108 (90 Base) MCG/ACT inhaler Inhale 2 puffs into the lungs every 6 (six) hours as needed for wheezing or shortness of breath. 1 Inhaler 5  . dexamethasone (DECADRON) 4 MG tablet Take 5 tablets (20 mg total) by mouth once a week for 12 doses. 20 tablet 2  . HYDROmorphone (DILAUDID) 2 MG tablet Take 1 tablet (2 mg total) by mouth every 4 (four) hours as needed for severe pain. 50 tablet 0  . nortriptyline (PAMELOR) 25 MG capsule Take 25 mg by mouth.  3  . omeprazole (PRILOSEC) 40 MG capsule Take 1 capsule (40 mg total) by mouth daily. 30 capsule 3  . OXYGEN 2lpm BEDTIME ONLY    . umeclidinium-vilanterol (ANORO ELLIPTA) 62.5-25 MCG/INH AEPB Inhale 1 puff into the lungs daily. 60 each 5  . acyclovir (ZOVIRAX) 400 MG tablet Take 400 mg by mouth daily.  3  . docusate sodium (COLACE) 50 MG capsule Take 1 capsule (50 mg total) by mouth daily. (Patient not taking: Reported on 05/08/2018) 30 capsule 0  . ferrous sulfate 325 (65 FE) MG EC tablet Take 1 tablet (325 mg total) by mouth 3 (three) times daily with meals. (Patient not taking: Reported on 05/08/2018) 90 tablet 0  . nortriptyline (PAMELOR) 25 MG capsule Take 1 capsule (25 mg total) by mouth at bedtime. 30 capsule 3  . tadalafil, PAH, (ADCIRCA) 20 MG tablet Take 2 tablets (40 mg total) by mouth daily. (Patient not taking:  Reported on 05/08/2018) 60 tablet 11  . umeclidinium-vilanterol (ANORO ELLIPTA) 62.5-25 MCG/INH AEPB Inhale 1 puff into the lungs daily. 1 each 0   No facility-administered medications prior to visit.      ROS Review of Systems  Constitutional: Negative for chills, fever and malaise/fatigue.  Eyes: Negative for blurred vision.  Respiratory: Negative for shortness of breath.   Cardiovascular: Negative for chest pain and palpitations.  Gastrointestinal: Negative for abdominal pain and nausea.  Genitourinary: Negative for dysuria and hematuria.  Musculoskeletal: Negative for joint pain and myalgias.  Skin: Negative for rash.  Neurological: Negative for tingling and headaches.  Psychiatric/Behavioral: Negative for depression. The patient is not nervous/anxious.     Objective:  BP 121/84 (BP Location: Right Arm, Patient Position: Sitting, Cuff Size: Small) Comment (Cuff Size): child cuff  Pulse 94   Temp 97.6 F (36.4 C) (Oral)   Ht 5' (1.524 m)   Wt 86 lb 3.2 oz (39.1 kg)   SpO2 97%   BMI 16.83 kg/m   BP/Weight 05/08/2018 04/21/2018 1/70/0174  Systolic BP 944 967 591  Diastolic BP 84 81 88  Wt. (Lbs) 86.2 - 82.9  BMI 16.83 - 16.19      Physical Exam  Constitutional: He is oriented to person, place, and time.  thin, NAD, polite  HENT:  Head: Normocephalic and atraumatic.  Eyes: No scleral icterus.  Neck:  Normal range of motion. Neck supple. No thyromegaly present.  Cardiovascular: Normal rate, regular rhythm and normal heart sounds.  Pulmonary/Chest: Effort normal and breath sounds normal.  Abdominal: Soft. Bowel sounds are normal. There is no tenderness.  Musculoskeletal: He exhibits no edema.  Neurological: He is alert and oriented to person, place, and time.  Skin: Skin is warm and dry. No rash noted. No erythema. No pallor.  Psychiatric: He has a normal mood and affect. His behavior is normal. Thought content normal.  Vitals reviewed.    Assessment & Plan:   1.  Annual physical exam - Lipid panel; Future  2. Need for Tdap vaccination - Tdap vaccine greater than or equal to 7yo IM; Future  3. Screening for prostate cancer - PSA; Future  4. Screening for colon cancer - Fecal occult blood, imunochemical  5. Gastroesophageal reflux disease, esophagitis presence not specified - Refill omeprazole (PRILOSEC) 40 MG capsule; Take 1 capsule (40 mg total) by mouth daily.  Dispense: 30 capsule; Refill: 5. Advised pt on abstaining from spicy foods, caffeine products, tomato sauce, chocolate, and alcohol. Pt education on GERD and diet printed and given to pt.   * Live interpreter used during encounter.   Meds ordered this encounter  Medications  . omeprazole (PRILOSEC) 40 MG capsule    Sig: Take 1 capsule (40 mg total) by mouth daily.    Dispense:  30 capsule    Refill:  5    Order Specific Question:   Supervising Provider    Answer:   Charlott Rakes [[id number]]    Follow-up: Return in about 1 year (around 05/09/2019) for annual physical .   [name] PA

## 2018-05-09 ENCOUNTER — Inpatient Hospital Stay: Payer: Self-pay

## 2018-05-09 VITALS — BP 135/89 | HR 91 | Temp 97.8°F

## 2018-05-09 DIAGNOSIS — C9000 Multiple myeloma not having achieved remission: Secondary | ICD-10-CM

## 2018-05-09 MED ORDER — PROCHLORPERAZINE MALEATE 10 MG PO TABS
10.0000 mg | ORAL_TABLET | Freq: Once | ORAL | Status: AC
  Administered 2018-05-09: 10 mg via ORAL

## 2018-05-09 MED ORDER — PROCHLORPERAZINE MALEATE 10 MG PO TABS
ORAL_TABLET | ORAL | Status: AC
  Filled 2018-05-09: qty 1

## 2018-05-09 MED ORDER — BORTEZOMIB CHEMO SQ INJECTION 3.5 MG (2.5MG/ML)
1.3000 mg/m2 | Freq: Once | INTRAMUSCULAR | Status: AC
  Administered 2018-05-09: 1.75 mg via SUBCUTANEOUS
  Filled 2018-05-09: qty 0.7

## 2018-05-09 NOTE — Patient Instructions (Signed)
[facility] Discharge Instructions for Patients Receiving Chemotherapy  Today you received the following chemotherapy agents Velcade.  To help prevent nausea and vomiting after your treatment, we encourage you to take your nausea medication as directed.  If you develop nausea and vomiting that is not controlled by your nausea medication, call the clinic.   BELOW ARE SYMPTOMS THAT SHOULD BE REPORTED IMMEDIATELY:  *FEVER GREATER THAN 100.5 F  *CHILLS WITH OR WITHOUT FEVER  NAUSEA AND VOMITING THAT IS NOT CONTROLLED WITH YOUR NAUSEA MEDICATION  *UNUSUAL SHORTNESS OF BREATH  *UNUSUAL BRUISING OR BLEEDING  TENDERNESS IN MOUTH AND THROAT WITH OR WITHOUT PRESENCE OF ULCERS  *URINARY PROBLEMS  *BOWEL PROBLEMS  UNUSUAL RASH Items with * indicate a potential emergency and should be followed up as soon as possible.  Feel free to call the clinic should you have any questions or concerns. The clinic phone number is [phone].  Please show the CHEMO ALERT CARD at check-in to the Emergency Department and triage nurse.   

## 2018-05-11 ENCOUNTER — Other Ambulatory Visit: Payer: Self-pay | Admitting: Medical

## 2018-05-11 MED FILL — DEXAMETHASONE 4 MG TABLET: 4 | 28 days supply | Qty: 20 | Fill #2

## 2018-05-11 MED FILL — OMEPRAZOLE 40 MG CPDR: 40 | 30 days supply | Qty: 30 | Fill #1

## 2018-05-12 ENCOUNTER — Other Ambulatory Visit: Payer: Self-pay | Admitting: Medical

## 2018-05-12 ENCOUNTER — Other Ambulatory Visit: Payer: Self-pay | Admitting: Hematology

## 2018-05-12 ENCOUNTER — Telehealth: Payer: Self-pay

## 2018-05-12 MED ORDER — HYDROMORPHONE HCL 2 MG PO TABS: 2.0000 mg | ORAL_TABLET | ORAL | 0 refills | Status: DC | PRN

## 2018-05-12 MED FILL — HYDROmorphone HCL 2 MG TABS: 2 | 9 days supply | Qty: 50 | Fill #0

## 2018-05-12 NOTE — Telephone Encounter (Signed)
Refill request for Proventil received. Apollo and made the pharmacist aware that this medication is managed by Dr. Doreene Burke at the [facility].

## 2018-05-13 ENCOUNTER — Ambulatory Visit (INDEPENDENT_AMBULATORY_CARE_PROVIDER_SITE_OTHER): Payer: Self-pay | Admitting: Pulmonary Disease

## 2018-05-13 ENCOUNTER — Encounter: Payer: Self-pay | Admitting: Pulmonary Disease

## 2018-05-13 VITALS — BP 118/78 | HR 92 | Ht 60.0 in | Wt 84.8 lb

## 2018-05-13 DIAGNOSIS — I272 Pulmonary hypertension, unspecified: Secondary | ICD-10-CM

## 2018-05-13 DIAGNOSIS — J449 Chronic obstructive pulmonary disease, unspecified: Secondary | ICD-10-CM

## 2018-05-13 DIAGNOSIS — C9000 Multiple myeloma not having achieved remission: Secondary | ICD-10-CM

## 2018-05-13 DIAGNOSIS — J479 Bronchiectasis, uncomplicated: Secondary | ICD-10-CM

## 2018-05-13 LAB — FECAL OCCULT BLOOD, IMMUNOCHEMICAL: FECAL OCCULT BLD: NEGATIVE

## 2018-05-13 MED ORDER — UMECLIDINIUM-VILANTEROL 62.5-25 MCG/INH IN AEPB: 1.0000 | INHALATION_SPRAY | Freq: Every day | RESPIRATORY_TRACT | 0 refills | Status: DC

## 2018-05-13 NOTE — Patient Instructions (Signed)
COPD: Continue Anoro daily Get a flu shot in the fall Stay active, practice good hand hygiene  Bronchiectasis: This term means that your lungs are scarred and you are at increased risk for having infections in your lungs Be sure to let us know if you start coughing up more mucus or feel more short of breath Please provide [place] with a sample of your mucus so that we can test to see if you have a chronic or long-term infection  Pulmonary hypertension: Stay off of tadalafil for now We will check your oxygen level while you are walking in the office today  Multiple myeloma: Continue follow-up with hematology clinic  We will see you back in 3 months or sooner if needed

## 2018-05-13 NOTE — Progress Notes (Signed)
Subjective:    Patient ID: [name] [name], male    DOB: Feb 14, 1961, 57 y.o.   MRN: [id number]  Synopsis: Referred in 2018 for evaluation of pulmonary hypertension in the setting of chronic left-sided hydropneumothorax and bronchiectasis.  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  Apparently this was originally treated as an empyema with VATS decortication in 2016.  CT imaging since then confirms bronchiectasis in the right lung and pulmonary function testing shows severe airflow obstruction.  In 2017 01/2017 he has had evidence of exertional hypoxemia.  Compliance with medications and oxygen use has been a challenge.  HPI Chief Complaint  Patient presents with  . Follow-up    Reports breathing is improved some   He comes back to clinic today to see me for the first time in several months.  He says that his breathing has been doing a little better lately.  He says that he will occasionally cough up some clear mucus but this is not a routine problem.  He is able to walk probably 1-2 city blocks before he has to stop and catch his breath.  Sometimes he can go further than this.  [facility] however typically make him much more short of breath.  He has not had recurrent episodes of bronchitis or pneumonia since his hospitalization earlier this spring.  He has been following with the hematology clinic for multiple myeloma.  He also notes that he is lost about 5 pounds and he feels hot at night.  He has not used a thermometer to measure a temperature.  He denies night sweats.  He is still using Anoro daily.   Past Medical History:  Diagnosis Date  . Acute on chronic respiratory failure ([place]) 02/07/2018  . Hypertension   . Kidney stones   . Pulmonary hypertension ([place])        Review of Systems  Constitutional: Negative for fever and unexpected weight change.  HENT: Negative for congestion, dental problem, ear pain, nosebleeds, postnasal drip, rhinorrhea, sinus pressure,  sneezing, sore throat and trouble swallowing.   Eyes: Negative for redness and itching.  Respiratory: Positive for cough, chest tightness and shortness of breath. Negative for wheezing.   Cardiovascular: Negative for palpitations and leg swelling.  Gastrointestinal: Negative for nausea and vomiting.  Genitourinary: Negative for dysuria.  Musculoskeletal: Negative for joint swelling.  Skin: Negative for [name].  Neurological: Negative for headaches.  Hematological: Does not bruise/bleed easily.  Psychiatric/Behavioral: Negative for dysphoric mood. The patient is not nervous/anxious.        Objective:   Physical Exam  Vitals:   05/13/18 1000  BP: 118/78  Pulse: 92  SpO2: 96%  Weight: 84 lb 12.8 oz (38.5 kg)  Height: 5' (1.524 m)  RA  Ambulated 500 feet on room air and O2 saturation remained at 95%  Gen: well appearing HENT: OP clear, neck supple PULM: Crackles/wheezes R base, diminished L base, normal percussion CV: RRR, no mgr, trace edema GI: BS+, soft, nontender Derm: no cyanosis or [name] Psyche: normal mood and affect    RHC: 06/2017 RA = 3, RV = 59/6   PA = 62/23 (40) PCW = 6 Fick cardiac output/index = 4.2/2.9 PVR = 8.1 WU Ao sat = 93%  PA sat = 67%, 68%  Echo: August 2018 LVEF 60-65%, D shaped interventricular septum suggestive of RV pressure volume overload, moderately dilated right ventricle with moderately depressed systolic function, PA pressure estimate 59 mmHg, RA dilation, valvues OK, moderate Tricuspid regurgitation  Chest imaging: March 2019 CT chest shows a chronic appearing hydropneumothorax on the left, significant bronchiectasis in the right lower lobe August 2018 chest x-ray images independently reviewed the prominent pulmonary vasculature, scar versus consolidation right lung, chronic appearing left-sided pleural effusion with apical pneumothorax4 January 2018 CT angiogram chest images independently reviewed showing chronic appearing hydropneumothorax in  the left lung, scattered consolidation patches in the right upper lobe and right lower lobe, there is also bronchiectasis changes in the right lower lobe.   Pulmonary function test: April 2017 ratio 45%, FEV1 0.56 L 21% predicted, FVC 1.25 L 37% predicted, total lung capacity 2.88 L 58% predicted, DLCO 10.30 54% predicted  Micro: 11/2016 Sputum: normal flora  Labs: 04/2016 Alpha 1 MZ, normal level; total IgG level severely elevated, IgA normal, IgM low, IgE 2,066  CBC    Component Value Date/Time   WBC 12.3 (H) 05/08/2018 1222   RBC 4.78 05/08/2018 1222   HGB 11.0 (L) 05/08/2018 1222   HGB 10.7 (L) 04/07/2018 1429   HGB 9.5 (L) 03/05/2018 1206   HGB 13.1 11/07/2017 1014   HCT 34.9 (L) 05/08/2018 1222   HCT 32.3 (L) 03/05/2018 1206   HCT 39.7 11/07/2017 1014   PLT 569 (H) 05/08/2018 1222   PLT 637 (H) 04/07/2018 1429   PLT 474 (H) 03/05/2018 1206   MCV 72.9 (L) 05/08/2018 1222   MCV 82 03/05/2018 1206   MCV 87.6 11/07/2017 1014   MCH 23.0 (L) 05/08/2018 1222   MCHC 31.6 (L) 05/08/2018 1222   RDW 19.6 (H) 05/08/2018 1222   RDW 20.5 (H) 03/05/2018 1206   RDW 14.5 11/07/2017 1014   LYMPHSABS 1.8 05/08/2018 1222   LYMPHSABS 1.5 03/05/2018 1206   LYMPHSABS 1.7 11/07/2017 1014   MONOABS 1.3 (H) 05/08/2018 1222   MONOABS 0.8 11/07/2017 1014   EOSABS 0.4 05/08/2018 1222   EOSABS 0.4 03/05/2018 1206   BASOSABS 0.2 (H) 05/08/2018 1222   BASOSABS 0.1 03/05/2018 1206   BASOSABS 0.0 11/07/2017 1014   Hospital records from 02/2018 reviewed where he was treated for pneumonia, Oncology records reviewed where he was seen for multiple myeloma     Assessment & Plan:    Bronchiectasis without complication ([place]) - Plan: Fungus Culture with Smear,  MYCOBACTERIA, CULTURE, WITH FLUOROCHROME SMEAR, Respiratory or Resp and Sputum Culture  COPD GOLD IV criteria but never smoked   Pulmonary HTN ([place])  Multiple myeloma, remission status unspecified ([place])   Discussion: He has pulmonary  hypertension secondary to very severe underlying lung disease with bronchiectasis and a chronic hydropneumothorax in the left lung.  As stated previously I do not think that treating with pulmonary vasodilators will make much of a difference.  In fact, he feels great right now and he has not been taking tadalafil for quite some time so I see no reason to continue this medicine.  He does have significant airflow obstruction related to COPD and bronchiectasis so I have encouraged him to continue taking Anoro.  I am concerned about the sensation of feeling hot at night and the weight loss with his bronchiectasis as he is at increased risk for an atypical mycobacterial disease.  Fortunately he does not have any of the other symptoms that would suggest something like MAI and that he has not had a change in his baseline chest congestion.  Plan: COPD: Continue Anoro daily Get a flu shot in the fall Stay active, practice good hand hygiene  Bronchiectasis: This term means that your lungs are scarred  and you are at increased risk for having infections in your lungs Be sure to let us know if you start coughing up more mucus or feel more short of breath Please provide [place] with a sample of your mucus so that we can test to see if you have a chronic or long-term infection  Pulmonary hypertension: Stay off of tadalafil for now We will check your oxygen level while you are walking in the office today  Multiple myeloma: Continue follow-up with hematology clinic  We will see you back in 3 months or sooner if needed   Current Outpatient Medications:  .  acyclovir (ZOVIRAX) 400 MG tablet, Take 400 mg by mouth daily., Disp: , Rfl: 3 .  albuterol (PROVENTIL HFA;VENTOLIN HFA) 108 (90 Base) MCG/ACT inhaler, Inhale 2 puffs into the lungs every 6 (six) hours as needed for wheezing or shortness of breath., Disp: 1 Inhaler, Rfl: 5 .  dexamethasone (DECADRON) 4 MG tablet, Take 5 tablets (20 mg total) by mouth once a  week for 12 doses., Disp: 20 tablet, Rfl: 2 .  docusate sodium (COLACE) 50 MG capsule, Take 1 capsule (50 mg total) by mouth daily., Disp: 30 capsule, Rfl: 0 .  HYDROmorphone (DILAUDID) 2 MG tablet, Take 1 tablet (2 mg total) by mouth every 4 (four) hours as needed for severe pain., Disp: 50 tablet, Rfl: 0 .  nortriptyline (PAMELOR) 25 MG capsule, Take 25 mg by mouth., Disp: , Rfl: 3 .  omeprazole (PRILOSEC) 40 MG capsule, Take 1 capsule (40 mg total) by mouth daily., Disp: 30 capsule, Rfl: 5 .  OXYGEN, 2lpm BEDTIME ONLY, Disp: , Rfl:  .  umeclidinium-vilanterol (ANORO ELLIPTA) 62.5-25 MCG/INH AEPB, Inhale 1 puff into the lungs daily., Disp: 60 each, Rfl: 5 .  nortriptyline (PAMELOR) 25 MG capsule, Take 1 capsule (25 mg total) by mouth at bedtime., Disp: 30 capsule, Rfl: 3 .  tadalafil, PAH, (ADCIRCA) 20 MG tablet, Take 2 tablets (40 mg total) by mouth daily. (Patient not taking: Reported on 05/08/2018), Disp: 60 tablet, Rfl: 11 .  umeclidinium-vilanterol (ANORO ELLIPTA) 62.5-25 MCG/INH AEPB, Inhale 1 puff into the lungs daily., Disp: 1 each, Rfl: 0

## 2018-05-13 NOTE — Progress Notes (Signed)
HEMATOLOGY/ONCOLOGY CONSULTATION NOTE  Date of Service: 05/14/2018  Patient Care Team: [name], PA-C as PCP - General (Physician Assistant) Michel Bickers, MD as Consulting Physician (Infectious Diseases) Brand Males, MD as Consulting Physician (Pulmonary Disease)  CHIEF COMPLAINTS/PURPOSE OF CONSULTATION:  Multiple myeloma  Oncologic History:  57 y.o.  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  Based on the diagnosis, patient was started on treatment with bortezomib & low-dose dexamethasone.  Initially, lenalidomide was omitted out of concern for high risk of for infectious complications.  We have continued to therapy with just a doublet treatment and did not reintensify the therapy.  Treatment course has been complicated by exacerbation of pulmonary hypertension and now with shingles reactivation resulting in blistering rash and skin ulcerations on the left flank.  Infection appears to be responding to higher dose of acyclovir and skin is healing.   HISTORY OF PRESENTING ILLNESS:   [name] [name] is a wonderful 57 y.o. male who has been referred to [place] by my colleague Dr [name] for evaluation and management of Multiple myeloma. He is accompanied today by a Optometrist. The pt reports that he is doing well overall.   The pt has been followed by my colleague Dr [name] and has been treated with Velcade and dexamethasone.    The pt reports that he has lost some weight and hasn't eaten as much because he hasn't been able to taste his food as well.   He notes that his shingles rash has continued to clear up and he denies pain associated with his remaining rash. His back pain continues and he has run out of his Hydromorphone. He notes that his back  pain has not gotten better since he was first evaluated. He notes that he feels tired and has a little SOB.   Most recent lab results (04/07/18) of CBC and CMP is as follows: all values are WNL except for Sodium at 134, chloride at 96, Total Protein at 8.7, Albumin at 3.0, Total bilirubin <0.2, WBC at 19.1k, HGB at 10.7, HCT at 33.2, MCV at 73.5, MCH at 23.7, RDW at 18.4, Platelets at 637k, ANC at 17.1k.Marland Kitchen MMP 04/07/18 showed IgG elevated at 2301 and M Protein at 1.5. Ferritin 04/07/18 was elevated at 436 Iron/TIBC 04/07/18 showed at 12% saturation ratio LDH 04/07/18 is WNL at 171 Kappa/Lambda 04/07/18 showed Kappa at 25.2 and K:L ratio at 2.27  On review of systems, pt reports some SOB, back pain, resolving shingles rash, mouth sores, weight loss, and denies fevers, chills, new skin rashes, and any other symptoms.  Interval History:   [name] [name] returns today for management and evaluation of his multiple myeloma. The patient's last visit with [place] was on 04/14/18. He is accompanied today by a Optometrist. The pt reports that he is doing well overall.   The pt reports that his back pain has been well controlled with one pill of Dilaudid in the morning. He is interested in having a conversation with IR regarding a vertebroplasty.   He notes that he is eating well but has some occasional reflux.   Lab results today (05/14/18) of CBC w/diff, CMP is as follows: all values are WNL except for WBC at 12.3k, HGB at 11.1, HCT at 34.9, MCV at 72.9, MCH at 23.3,  MCHC at 31.9, RDW at 19.8, ANC at 8.8k, Monocytes abs at 1.2k, Eosinophils abs at 600, Basophils abs at 200, Sodium at 134, Glucose at 137, Albumin at 2.8, AST at 14.  On review of systems, pt reports controlled back pain, eating well, moving his bowels well, and denies fevers, chills, night sweats, and any other symptoms.   MEDICAL HISTORY:  Past Medical History:  Diagnosis Date  . Acute on chronic respiratory failure ([place]) 02/07/2018  . Hypertension   . Kidney  stones   . Pulmonary hypertension ([place])     SURGICAL HISTORY: Past Surgical History:  Procedure Laterality Date  . CARDIAC CATHETERIZATION N/A 01/18/2016   Procedure: Right Heart Cath;  Surgeon: Jolaine Artist, MD;  Location: [facility];  Service: Cardiovascular;  Laterality: N/A;  . CARDIAC CATHETERIZATION N/A 06/24/2016   Procedure: Right Heart Cath;  Surgeon: Jolaine Artist, MD;  Location: [facility];  Service: Cardiovascular;  Laterality: N/A;  . KIDNEY SURGERY    . VIDEO ASSISTED THORACOSCOPY (VATS)/DECORTICATION Left 2003  . VIDEO ASSISTED THORACOSCOPY (VATS)/DECORTICATION Left 10/02/2015   Procedure: VIDEO ASSISTED THORACOSCOPY (VATS)/DECORTICATION and drainage of chronic empyena;  Surgeon: [name], MD;  Location: [facility];  Service: Thoracic;  Laterality: Left;  Marland Kitchen VIDEO BRONCHOSCOPY N/A 10/02/2015   Procedure: VIDEO BRONCHOSCOPY;  Surgeon: [name], MD;  Location: [facility];  Service: Thoracic;  Laterality: N/A;    SOCIAL HISTORY: Social History   Socioeconomic History  . Marital status: Single    Spouse name: Not on file  . Number of children: Not on file  . Years of education: Not on file  . Highest education level: Not on file  Occupational History  . Not on file  Social Needs  . Financial resource strain: Not on file  . Food insecurity:    Worry: Not on file    Inability: Not on file  . Transportation needs:    Medical: Not on file    Non-medical: Not on file  Tobacco Use  . Smoking status: Never Smoker  . Smokeless tobacco: Never Used  Substance and Sexual Activity  . Alcohol use: Yes  . Drug use: No  . Sexual activity: Not on file  Lifestyle  . Physical activity:    Days per week: Not on file    Minutes per session: Not on file  . Stress: Not on file  Relationships  . Social connections:    Talks on phone: Not on file    Gets together: Not on file    Attends religious service: Not on file    Active member of club  or organization: Not on file    Attends meetings of clubs or organizations: Not on file    Relationship status: Not on file  . Intimate partner violence:    Fear of current or ex partner: Not on file    Emotionally abused: Not on file    Physically abused: Not on file    Forced sexual activity: Not on file  Other Topics Concern  . Not on file  Social History Narrative  . Not on file    FAMILY HISTORY: Family History  Problem Relation Age of Onset  . Hypertension Other     ALLERGIES:  has No Known Allergies.  MEDICATIONS:  Current Outpatient Medications  Medication Sig Dispense Refill  . acyclovir (ZOVIRAX) 400 MG tablet Take 400 mg by mouth daily.  3  . albuterol (PROVENTIL HFA;VENTOLIN HFA) 108 (90  Base) MCG/ACT inhaler Inhale 2 puffs into the lungs every 6 (six) hours as needed for wheezing or shortness of breath. 1 Inhaler 5  . dexamethasone (DECADRON) 4 MG tablet Take 5 tablets (20 mg total) by mouth once a week for 12 doses. 20 tablet 2  . docusate sodium (COLACE) 50 MG capsule Take 1 capsule (50 mg total) by mouth daily. 30 capsule 0  . HYDROmorphone (DILAUDID) 2 MG tablet Take 1 tablet (2 mg total) by mouth every 4 (four) hours as needed for severe pain. 50 tablet 0  . nortriptyline (PAMELOR) 25 MG capsule Take 1 capsule (25 mg total) by mouth at bedtime. 30 capsule 3  . nortriptyline (PAMELOR) 25 MG capsule Take 25 mg by mouth.  3  . omeprazole (PRILOSEC) 40 MG capsule Take 1 capsule (40 mg total) by mouth daily. 30 capsule 5  . OXYGEN 2lpm BEDTIME ONLY    . tadalafil, PAH, (ADCIRCA) 20 MG tablet Take 2 tablets (40 mg total) by mouth daily. (Patient not taking: Reported on 05/08/2018) 60 tablet 11  . umeclidinium-vilanterol (ANORO ELLIPTA) 62.5-25 MCG/INH AEPB Inhale 1 puff into the lungs daily. 60 each 5  . umeclidinium-vilanterol (ANORO ELLIPTA) 62.5-25 MCG/INH AEPB Inhale 1 puff into the lungs daily. 1 each 0   No current facility-administered medications for this  visit.     REVIEW OF SYSTEMS:    A 10+ POINT REVIEW OF SYSTEMS WAS OBTAINED including neurology, dermatology, psychiatry, cardiac, respiratory, lymph, extremities, GI, GU, Musculoskeletal, constitutional, breasts, reproductive, HEENT.  All pertinent positives are noted in the HPI.  All others are negative.   PHYSICAL EXAMINATION: ECOG PERFORMANCE STATUS: 2 - Symptomatic, <50% confined to bed  . Vitals:   05/14/18 1219  BP: (!) 143/100  Pulse: 93  Resp: 18  Temp: 98.1 F (36.7 C)  SpO2: 95%   Filed Weights   05/14/18 1219  Weight: 85 lb 11.2 oz (38.9 kg)   .Body mass index is 16.74 kg/m.  GENERAL:alert, in no acute distress and comfortable SKIN: no acute rashes, no significant lesions EYES: conjunctiva are pink and non-injected, sclera anicteric OROPHARYNX: MMM, no exudates, no oropharyngeal erythema or ulceration NECK: supple, no JVD LYMPH:  no palpable lymphadenopathy in the cervical, axillary or inguinal regions LUNGS: clear to auscultation b/l with normal respiratory effort HEART: regular rate & rhythm ABDOMEN:  normoactive bowel sounds , non tender, not distended. No palpable hepatosplenomegaly.  Extremity: no pedal edema PSYCH: alert & oriented x 3 with fluent speech NEURO: no focal motor/sensory deficits   LABORATORY DATA:  I have reviewed the data as listed  . CBC Latest Ref Rng & Units 05/14/2018 05/08/2018 04/21/2018  WBC 4.0 - 10.3 K/uL 12.3(H) 12.3(H) 12.4(H)  Hemoglobin 13.0 - 17.1 g/dL 11.1(L) 11.0(L) 10.5(L)  Hematocrit 38.4 - 49.9 % 34.9(L) 34.9(L) 33.2(L)  Platelets 140 - 400 K/uL 389 569(H) 634(H)   . CBC    Component Value Date/Time   WBC 12.3 (H) 05/14/2018 1200   RBC 4.78 05/14/2018 1200   HGB 11.1 (L) 05/14/2018 1200   HGB 10.7 (L) 04/07/2018 1429   HGB 9.5 (L) 03/05/2018 1206   HGB 13.1 11/07/2017 1014   HCT 34.9 (L) 05/14/2018 1200   HCT 32.3 (L) 03/05/2018 1206   HCT 39.7 11/07/2017 1014   PLT 389 05/14/2018 1200   PLT 637 (H)  04/07/2018 1429   PLT 474 (H) 03/05/2018 1206   MCV 72.9 (L) 05/14/2018 1200   MCV 82 03/05/2018 1206   MCV 87.6  11/07/2017 1014   MCH 23.3 (L) 05/14/2018 1200   MCHC 31.9 (L) 05/14/2018 1200   RDW 19.8 (H) 05/14/2018 1200   RDW 20.5 (H) 03/05/2018 1206   RDW 14.5 11/07/2017 1014   LYMPHSABS 1.5 05/14/2018 1200   LYMPHSABS 1.5 03/05/2018 1206   LYMPHSABS 1.7 11/07/2017 1014   MONOABS 1.2 (H) 05/14/2018 1200   MONOABS 0.8 11/07/2017 1014   EOSABS 0.6 (H) 05/14/2018 1200   EOSABS 0.4 03/05/2018 1206   BASOSABS 0.2 (H) 05/14/2018 1200   BASOSABS 0.1 03/05/2018 1206   BASOSABS 0.0 11/07/2017 1014    . CMP Latest Ref Rng & Units 05/14/2018 05/08/2018 04/21/2018  Glucose 70 - 99 mg/dL 137(H) 106(H) 165(H)  BUN 6 - 20 mg/dL _0 Creatinine 0.61 - 1.24 mg/dL 0.85 0.82 0.89  Sodium 135 - 145 mmol/L 134(L) 134(L) 130(L)  Potassium 3.5 - 5.1 mmol/L 4.5 4.6 4.7  Chloride 98 - 111 mmol/L 101 97(L) 95(L)  CO2 22 - 32 mmol/L _1 Calcium 8.9 - 10.3 mg/dL 8.9 9.2 9.4  Total Protein 6.5 - 8.1 g/dL 7.4 7.3 7.9  Total Bilirubin 0.3 - 1.2 mg/dL 0.3 <0.2(L) <0.2(L)  Alkaline Phos 38 - 126 U/L 95 107 94  AST 15 - 41 U/L 14(L) 20 22  ALT 0 - 44 U/L _2 03/18/18 Cytogenetics:  03/18/18 Pathology:  11/03/17 Cytogenetics:    RADIOGRAPHIC STUDIES: I have personally reviewed the radiological images as listed and agreed with the findings in the report. No results found.  ASSESSMENT & PLAN:  57 y.o. male with  1. 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  Based on the diagnosis, patient was started on treatment with bortezomib & low-dose dexamethasone.  Initially, lenalidomide was omitted out of concern for high risk of for infectious complications.  We have continued to therapy with just a doublet  treatment and did not reintensify the therapy.  Treatment course has been complicated by exacerbation of pulmonary hypertension and now with shingles reactivation BM 03/18/18 shows limited response with still 18% plasma cell in the bone marrow. Cytogenetics shows  14.5% [id number]  PLAN --Discussed pt labwork today, 05/14/18; HGB at 11.1, MCV at 72.9, PLT normal at 389k, ANC at 8.8k -Discussed that we will consider changing treatment if his M protein level does not response further.  -Offered a referral to IR for vertebroplasty discussion - pt would like to have this discussion -Discussed that fluid collection around lung limits the agressiveness of treatment options - if more aggressive treatment is indicated, would need pulmonology clearance -Will see pt back in one month  -The pt has no prohibitive toxicities from continuing Velcade at this time.     Continue velcade weekly -- please schedule next 6 weeks with labs Referral to IR to consider vertebroplasty for L spine fracture RTC with Dr Irene Limbo in 4 weeks   All of the patients questions were answered with apparent satisfaction. The patient knows to call the clinic with any problems, questions or concerns.  The total time spent in the appt was 20 minutes and more than 50% was on counseling and direct patient cares.     Sullivan Lone MD [place] AAHIVMS [facility] Hematology/Oncology Physician [facility]  (Office):       [phone] (Work cell):  [phone] (  Fax):           [phone]  05/14/2018 12:50 PM  I, Baldwin [place], am acting as a scribe for Dr Irene Limbo.   .I have reviewed the above documentation for accuracy and completeness, and I agree with the above. Brunetta Genera MD

## 2018-05-14 ENCOUNTER — Inpatient Hospital Stay (HOSPITAL_BASED_OUTPATIENT_CLINIC_OR_DEPARTMENT_OTHER): Payer: Self-pay | Admitting: Hematology

## 2018-05-14 ENCOUNTER — Other Ambulatory Visit: Payer: Self-pay | Admitting: Hematology

## 2018-05-14 ENCOUNTER — Inpatient Hospital Stay: Payer: Self-pay

## 2018-05-14 ENCOUNTER — Telehealth: Payer: Self-pay | Admitting: Hematology

## 2018-05-14 ENCOUNTER — Encounter: Payer: Self-pay | Admitting: Hematology

## 2018-05-14 ENCOUNTER — Other Ambulatory Visit: Payer: Self-pay

## 2018-05-14 VITALS — BP 123/86 | HR 86

## 2018-05-14 VITALS — BP 143/100 | HR 93 | Temp 98.1°F | Resp 18 | Ht 60.0 in | Wt 85.7 lb

## 2018-05-14 DIAGNOSIS — Z7189 Other specified counseling: Secondary | ICD-10-CM

## 2018-05-14 DIAGNOSIS — C9000 Multiple myeloma not having achieved remission: Secondary | ICD-10-CM

## 2018-05-14 DIAGNOSIS — M545 Low back pain: Secondary | ICD-10-CM

## 2018-05-14 DIAGNOSIS — J479 Bronchiectasis, uncomplicated: Secondary | ICD-10-CM

## 2018-05-14 LAB — CBC WITH DIFFERENTIAL/PLATELET
Basophils Absolute: 0.2 10*3/uL — ABNORMAL HIGH (ref 0.0–0.1)
Basophils Relative: 1 %
Eosinophils Absolute: 0.6 10*3/uL — ABNORMAL HIGH (ref 0.0–0.5)
Eosinophils Relative: 5 %
HCT: 34.9 % — ABNORMAL LOW (ref 38.4–49.9)
HEMOGLOBIN: 11.1 g/dL — AB (ref 13.0–17.1)
Lymphocytes Relative: 12 %
Lymphs Abs: 1.5 10*3/uL (ref 0.9–3.3)
MCH: 23.3 pg — ABNORMAL LOW (ref 27.2–33.4)
MCHC: 31.9 g/dL — AB (ref 32.0–36.0)
MCV: 72.9 fL — ABNORMAL LOW (ref 79.3–98.0)
MONOS PCT: 10 %
Monocytes Absolute: 1.2 10*3/uL — ABNORMAL HIGH (ref 0.1–0.9)
NEUTROS PCT: 72 %
Neutro Abs: 8.8 10*3/uL — ABNORMAL HIGH (ref 1.5–6.5)
Platelets: 389 10*3/uL (ref 140–400)
RBC: 4.78 MIL/uL (ref 4.20–5.82)
RDW: 19.8 % — AB (ref 11.0–14.6)
WBC: 12.3 10*3/uL — AB (ref 4.0–10.3)

## 2018-05-14 LAB — COMPREHENSIVE METABOLIC PANEL
ALK PHOS: 95 U/L (ref 38–126)
ALT: 10 U/L (ref 0–44)
AST: 14 U/L — AB (ref 15–41)
Albumin: 2.8 g/dL — ABNORMAL LOW (ref 3.5–5.0)
Anion gap: 6 (ref 5–15)
BILIRUBIN TOTAL: 0.3 mg/dL (ref 0.3–1.2)
BUN: 12 mg/dL (ref 6–20)
CO2: 27 mmol/L (ref 22–32)
CREATININE: 0.85 mg/dL (ref 0.61–1.24)
Calcium: 8.9 mg/dL (ref 8.9–10.3)
Chloride: 101 mmol/L (ref 98–111)
GFR calc Af Amer: >60 mL/min (ref >60)
GLUCOSE: 137 mg/dL — AB (ref 70–99)
Potassium: 4.5 mmol/L (ref 3.5–5.1)
Sodium: 134 mmol/L — ABNORMAL LOW (ref 135–145)
TOTAL PROTEIN: 7.4 g/dL (ref 6.5–8.1)

## 2018-05-14 MED ORDER — PROCHLORPERAZINE MALEATE 10 MG PO TABS
10.0000 mg | ORAL_TABLET | Freq: Once | ORAL | Status: AC
  Administered 2018-05-14: 10 mg via ORAL

## 2018-05-14 MED ORDER — BORTEZOMIB CHEMO SQ INJECTION 3.5 MG (2.5MG/ML)
1.3000 mg/m2 | Freq: Once | INTRAMUSCULAR | Status: AC
  Administered 2018-05-14: 1.75 mg via SUBCUTANEOUS
  Filled 2018-05-14: qty 0.7

## 2018-05-14 MED ORDER — PROCHLORPERAZINE MALEATE 10 MG PO TABS
ORAL_TABLET | ORAL | Status: AC
  Filled 2018-05-14: qty 1

## 2018-05-14 MED ORDER — SODIUM CHLORIDE 0.9 % IV SOLN
3.5000 mg | Freq: Once | INTRAVENOUS | Status: AC
  Administered 2018-05-14: 3.5 mg via INTRAVENOUS
  Filled 2018-05-14: qty 4.38

## 2018-05-14 NOTE — Telephone Encounter (Signed)
Per 7/11 los - no referral placed by Dr. Irene Limbo -Referral to IR to consider vertebroplasty for L spine fracture

## 2018-05-14 NOTE — Patient Instructions (Signed)
[facility] Discharge Instructions for Patients Receiving Chemotherapy  Today you received the following chemotherapy agents bortezomib (Velcade) and zolendric acid (Zometa)  To help prevent nausea and vomiting after your treatment, we encourage you to take your nausea medication as directed.    If you develop nausea and vomiting that is not controlled by your nausea medication, call the clinic.   BELOW ARE SYMPTOMS THAT SHOULD BE REPORTED IMMEDIATELY:  *FEVER GREATER THAN 100.5 F  *CHILLS WITH OR WITHOUT FEVER  NAUSEA AND VOMITING THAT IS NOT CONTROLLED WITH YOUR NAUSEA MEDICATION  *UNUSUAL SHORTNESS OF BREATH  *UNUSUAL BRUISING OR BLEEDING  TENDERNESS IN MOUTH AND THROAT WITH OR WITHOUT PRESENCE OF ULCERS  *URINARY PROBLEMS  *BOWEL PROBLEMS  UNUSUAL RASH Items with * indicate a potential emergency and should be followed up as soon as possible.  Feel free to call the clinic should you have any questions or concerns. The clinic phone number is [phone].  Please show the [facility] at check-in to the Emergency Department and triage nurse.

## 2018-05-14 NOTE — Telephone Encounter (Signed)
Scheduled appt per 7/11 los - gave patient AVS and calender per los. In treatment area.

## 2018-05-15 ENCOUNTER — Telehealth (INDEPENDENT_AMBULATORY_CARE_PROVIDER_SITE_OTHER): Payer: Self-pay

## 2018-05-15 NOTE — Telephone Encounter (Signed)
Unable to leave message; voicemail box full. Will try calling again. Nat Christen, CMA

## 2018-05-15 NOTE — Telephone Encounter (Signed)
-----   Message from [name], PA-C sent at 05/14/2018  8:40 AM EDT ----- Negative FIT.

## 2018-05-19 ENCOUNTER — Telehealth (INDEPENDENT_AMBULATORY_CARE_PROVIDER_SITE_OTHER): Payer: Self-pay

## 2018-05-19 ENCOUNTER — Encounter (INDEPENDENT_AMBULATORY_CARE_PROVIDER_SITE_OTHER): Payer: Self-pay

## 2018-05-19 NOTE — Telephone Encounter (Signed)
-----   Message from [name], PA-C sent at 05/14/2018  8:40 AM EDT ----- Negative FIT.

## 2018-05-19 NOTE — Telephone Encounter (Signed)
Unable to leave message as voicemail box is full. Results have been mailed. Nat Christen, CMA

## 2018-05-21 ENCOUNTER — Inpatient Hospital Stay: Payer: Self-pay

## 2018-05-21 ENCOUNTER — Other Ambulatory Visit: Payer: Self-pay | Admitting: Hematology

## 2018-05-21 VITALS — BP 108/72 | HR 92 | Temp 98.1°F | Wt 87.4 lb

## 2018-05-21 DIAGNOSIS — C9000 Multiple myeloma not having achieved remission: Secondary | ICD-10-CM

## 2018-05-21 LAB — CMP (CANCER CENTER ONLY)
ALBUMIN: 2.9 g/dL — AB (ref 3.5–5.0)
ALT: 14 U/L (ref 0–44)
AST: 18 U/L (ref 15–41)
Alkaline Phosphatase: 87 U/L (ref 38–126)
Anion gap: 9 (ref 5–15)
BILIRUBIN TOTAL: 0.2 mg/dL — AB (ref 0.3–1.2)
BUN: 8 mg/dL (ref 6–20)
CHLORIDE: 99 mmol/L (ref 98–111)
CO2: 30 mmol/L (ref 22–32)
Calcium: 8.9 mg/dL (ref 8.9–10.3)
Creatinine: 0.87 mg/dL (ref 0.61–1.24)
GFR, Est AFR Am: >60 mL/min (ref >60)
GFR, Estimated: >60 mL/min (ref >60)
Glucose, Bld: 96 mg/dL (ref 70–99)
POTASSIUM: 4 mmol/L (ref 3.5–5.1)
Sodium: 138 mmol/L (ref 135–145)
Total Protein: 7.6 g/dL (ref 6.5–8.1)

## 2018-05-21 LAB — CBC WITH DIFFERENTIAL/PLATELET
Basophils Absolute: 0 10*3/uL (ref 0.0–0.1)
Basophils Relative: 0 %
Eosinophils Absolute: 0.5 10*3/uL (ref 0.0–0.5)
Eosinophils Relative: 5 %
HEMATOCRIT: 36.5 % — AB (ref 38.4–49.9)
Hemoglobin: 11.5 g/dL — ABNORMAL LOW (ref 13.0–17.1)
LYMPHS ABS: 2.1 10*3/uL (ref 0.9–3.3)
LYMPHS PCT: 19 %
MCH: 23.1 pg — AB (ref 27.2–33.4)
MCHC: 31.5 g/dL — ABNORMAL LOW (ref 32.0–36.0)
MCV: 73.3 fL — ABNORMAL LOW (ref 79.3–98.0)
Monocytes Absolute: 1.5 10*3/uL — ABNORMAL HIGH (ref 0.1–0.9)
Monocytes Relative: 14 %
NEUTROS PCT: 62 %
Neutro Abs: 6.9 10*3/uL — ABNORMAL HIGH (ref 1.5–6.5)
Platelets: 304 10*3/uL (ref 140–400)
RBC: 4.98 MIL/uL (ref 4.20–5.82)
RDW: 17.9 % — ABNORMAL HIGH (ref 11.0–14.6)
WBC: 11 10*3/uL — AB (ref 4.0–10.3)

## 2018-05-21 MED ORDER — PROCHLORPERAZINE MALEATE 10 MG PO TABS
ORAL_TABLET | ORAL | Status: AC
Start: 2018-05-21 — End: ?
  Filled 2018-05-21: qty 1

## 2018-05-21 MED ORDER — PROCHLORPERAZINE MALEATE 10 MG PO TABS
10.0000 mg | ORAL_TABLET | Freq: Once | ORAL | Status: AC
  Administered 2018-05-21: 10 mg via ORAL

## 2018-05-21 MED ORDER — BORTEZOMIB CHEMO SQ INJECTION 3.5 MG (2.5MG/ML)
1.3000 mg/m2 | Freq: Once | INTRAMUSCULAR | Status: AC
  Administered 2018-05-21: 1.75 mg via SUBCUTANEOUS
  Filled 2018-05-21: qty 0.7

## 2018-05-21 NOTE — Patient Instructions (Addendum)
[facility] Discharge Instructions for Patients Receiving Chemotherapy  Today you received the following chemotherapy agents Velcade To help prevent nausea and vomiting after your treatment, we encourage you to take your nausea medication as prescribed.   If you develop nausea and vomiting that is not controlled by your nausea medication, call the clinic.   BELOW ARE SYMPTOMS THAT SHOULD BE REPORTED IMMEDIATELY:  *FEVER GREATER THAN 100.5 F  *CHILLS WITH OR WITHOUT FEVER  NAUSEA AND VOMITING THAT IS NOT CONTROLLED WITH YOUR NAUSEA MEDICATION  *UNUSUAL SHORTNESS OF BREATH  *UNUSUAL BRUISING OR BLEEDING  TENDERNESS IN MOUTH AND THROAT WITH OR WITHOUT PRESENCE OF ULCERS  *URINARY PROBLEMS  *BOWEL PROBLEMS  UNUSUAL RASH Items with * indicate a potential emergency and should be followed up as soon as possible.  Feel free to call the clinic should you have any questions or concerns. The clinic phone number is [phone].  Please show the CHEMO ALERT CARD at check-in to the Emergency Department and triage nurse.   

## 2018-05-22 ENCOUNTER — Telehealth: Payer: Self-pay | Admitting: Pulmonary Disease

## 2018-05-22 LAB — MULTIPLE MYELOMA PANEL, SERUM
ALBUMIN SERPL ELPH-MCNC: 2.6 g/dL — AB (ref 2.9–4.4)
ALBUMIN/GLOB SERPL: 0.7 (ref 0.7–1.7)
ALPHA2 GLOB SERPL ELPH-MCNC: 1 g/dL (ref 0.4–1.0)
Alpha 1: 0.4 g/dL (ref 0.0–0.4)
B-GLOBULIN SERPL ELPH-MCNC: 1 g/dL (ref 0.7–1.3)
GAMMA GLOB SERPL ELPH-MCNC: 1.9 g/dL — AB (ref 0.4–1.8)
GLOBULIN, TOTAL: 4.3 g/dL — AB (ref 2.2–3.9)
IGA: 77 mg/dL — AB (ref 90–386)
IgG (Immunoglobin G), Serum: 2041 mg/dL — ABNORMAL HIGH (ref 700–1600)
IgM (Immunoglobulin M), Srm: 52 mg/dL (ref 20–172)
M Protein SerPl Elph-Mcnc: 1.4 g/dL — ABNORMAL HIGH
TOTAL PROTEIN ELP: 6.9 g/dL (ref 6.0–8.5)

## 2018-05-22 LAB — KAPPA/LAMBDA LIGHT CHAINS
Kappa free light chain: 39.6 mg/L — ABNORMAL HIGH (ref 3.3–19.4)
Kappa, lambda light chain ratio: 3.41 — ABNORMAL HIGH (ref 0.26–1.65)
Lambda free light chains: 11.6 mg/L (ref 5.7–26.3)

## 2018-05-22 NOTE — Telephone Encounter (Signed)
Called patients care giver. Unable tor each. Left message to give [place] a call back.

## 2018-05-25 ENCOUNTER — Telehealth (INDEPENDENT_AMBULATORY_CARE_PROVIDER_SITE_OTHER): Payer: Self-pay

## 2018-05-25 MED FILL — ACYCLOVIR 400 MG TABLET: 400 | 30 days supply | Qty: 60 | Fill #2

## 2018-05-25 MED FILL — NORTRIPTYLINE HCL 25 MG CAP: 25 | 30 days supply | Qty: 30 | Fill #2

## 2018-05-25 NOTE — Telephone Encounter (Signed)
Patients caregiver called to report that patient has been vomiting and not eating since having some type of infusion on last Thursday 7/18. Would like to know if patient should schedule OV or go to ED/UC. Nat Christen, CMA    Call back number is [phone] room number [phone]

## 2018-05-25 NOTE — Telephone Encounter (Signed)
Pt has received three different medications by infusion since 05/14/18. Please have them call the oncologist at [phone] for further advised. Thank you.

## 2018-05-25 NOTE — Telephone Encounter (Signed)
Patient advised to call the cancer center. Nat Christen, CMA

## 2018-05-25 NOTE — Telephone Encounter (Signed)
ATC caregiver-number is not available at this time.

## 2018-05-26 ENCOUNTER — Other Ambulatory Visit: Payer: Self-pay

## 2018-05-27 ENCOUNTER — Encounter: Payer: Self-pay | Admitting: Radiology

## 2018-05-27 ENCOUNTER — Ambulatory Visit
Admission: RE | Admit: 2018-05-27 | Discharge: 2018-05-27 | Disposition: A | Discharge status: Home / Self Care | Payer: Self-pay | Source: Ambulatory Visit | Attending: Hematology | Admitting: Hematology

## 2018-05-27 DIAGNOSIS — C9000 Multiple myeloma not having achieved remission: Secondary | ICD-10-CM

## 2018-05-27 HISTORY — PX: IR RADIOLOGIST EVAL & MGMT: IMG5224

## 2018-05-27 NOTE — Consult Note (Signed)
Chief Complaint: Patient was seen in consultation today for  Chief Complaint  Patient presents with  . Consult    Vertebroplasty/Ablation    at the request of Brunetta Genera  Referring Physician(s): Brunetta Genera  History of Present Illness: [name] [name] is a 57 y.o. male with a history of multiple myeloma with monoclonal gammopathy and a hypermetabolic lytic lesion in the left aspect of L4 as well as 24% plasma cell population in the bone marrow.  He presents today to discuss possible radio frequency ablation and cement augmentation of the L4 lytic lesion.  Unfortunately, [name] [name] is not inclined to participate with our telephone interpretation system and therefore my ability to gather the history of the present illness is very limited.  He denies back pain, pelvic pain and radiculopathy.  I am unable to get him to admit to any clinical symptoms at this time.  Past Medical History:  Diagnosis Date  . Acute on chronic respiratory failure ([place]) 02/07/2018  . Hypertension   . Kidney stones   . Pulmonary hypertension ([place])     Past Surgical History:  Procedure Laterality Date  . CARDIAC CATHETERIZATION N/A 01/18/2016   Procedure: Right Heart Cath;  Surgeon: Jolaine Artist, MD;  Location: [facility];  Service: Cardiovascular;  Laterality: N/A;  . CARDIAC CATHETERIZATION N/A 06/24/2016   Procedure: Right Heart Cath;  Surgeon: Jolaine Artist, MD;  Location: [facility];  Service: Cardiovascular;  Laterality: N/A;  . KIDNEY SURGERY    . VIDEO ASSISTED THORACOSCOPY (VATS)/DECORTICATION Left 2003  . VIDEO ASSISTED THORACOSCOPY (VATS)/DECORTICATION Left 10/02/2015   Procedure: VIDEO ASSISTED THORACOSCOPY (VATS)/DECORTICATION and drainage of chronic empyena;  Surgeon: [name], MD;  Location: [facility];  Service: Thoracic;  Laterality: Left;  Marland Kitchen VIDEO BRONCHOSCOPY N/A 10/02/2015   Procedure: VIDEO BRONCHOSCOPY;  Surgeon: [name], MD;   Location: [facility];  Service: Thoracic;  Laterality: N/A;    Allergies: Patient has no known allergies.  Medications: Prior to Admission medications   Medication Sig Start Date End Date Taking? Authorizing Provider  acyclovir (ZOVIRAX) 400 MG tablet Take 400 mg by mouth daily. 04/22/18   [provider]  albuterol (PROVENTIL HFA;VENTOLIN HFA) 108 (90 Base) MCG/ACT inhaler Inhale 2 puffs into the lungs every 6 (six) hours as needed for wheezing or shortness of breath. 03/05/18   [name], PA-C  dexamethasone (DECADRON) 4 MG tablet Take 5 tablets (20 mg total) by mouth once a week for 12 doses. 03/16/18 06/02/18  Ardath Sax, MD  docusate sodium (COLACE) 50 MG capsule Take 1 capsule (50 mg total) by mouth daily. 03/06/18   [name], PA-C  HYDROmorphone (DILAUDID) 2 MG tablet Take 1 tablet (2 mg total) by mouth every 4 (four) hours as needed for severe pain. 05/12/18 06/11/18  Brunetta Genera, MD  nortriptyline (PAMELOR) 25 MG capsule Take 1 capsule (25 mg total) by mouth at bedtime. 03/16/18 04/15/18  Ardath Sax, MD  nortriptyline (PAMELOR) 25 MG capsule Take 25 mg by mouth. 04/22/18   [provider]  omeprazole (PRILOSEC) 40 MG capsule Take 1 capsule (40 mg total) by mouth daily. 05/08/18   [name], PA-C  OXYGEN 2lpm BEDTIME ONLY    [provider]  tadalafil, PAH, (ADCIRCA) 20 MG tablet Take 2 tablets (40 mg total) by mouth daily. Patient not taking: Reported on 05/08/2018 08/26/17   Juanito Doom, MD  umeclidinium-vilanterol [facility] ELLIPTA) 62.5-25 MCG/INH  AEPB Inhale 1 puff into the lungs daily. 03/05/18   [name], PA-C  umeclidinium-vilanterol [facility] ELLIPTA) 62.5-25 MCG/INH AEPB Inhale 1 puff into the lungs daily. 05/13/18   Juanito Doom, MD     Family History  Problem Relation Age of Onset  . Hypertension Other     Social History   Socioeconomic History  . Marital status: Single    Spouse name: Not on  file  . Number of children: Not on file  . Years of education: Not on file  . Highest education level: Not on file  Occupational History  . Not on file  Social Needs  . Financial resource strain: Not on file  . Food insecurity:    Worry: Not on file    Inability: Not on file  . Transportation needs:    Medical: Not on file    Non-medical: Not on file  Tobacco Use  . Smoking status: Never Smoker  . Smokeless tobacco: Never Used  Substance and Sexual Activity  . Alcohol use: Yes  . Drug use: No  . Sexual activity: Not on file  Lifestyle  . Physical activity:    Days per week: Not on file    Minutes per session: Not on file  . Stress: Not on file  Relationships  . Social connections:    Talks on phone: Not on file    Gets together: Not on file    Attends religious service: Not on file    Active member of club or organization: Not on file    Attends meetings of clubs or organizations: Not on file    Relationship status: Not on file  Other Topics Concern  . Not on file  Social History Narrative  . Not on file    ECOG Status: 0 - Asymptomatic  Review of Systems: A 12 point ROS discussed and pertinent positives are indicated in the HPI above.  All other systems are negative.  Review of Systems  Vital Signs: BP 109/89   Pulse 88   Temp 97.6 F (36.4 C) (Oral)   Resp 14   Ht 5' (1.524 m)   Wt 87 lb (39.5 kg)   SpO2 99%   BMI 16.99 kg/m   Physical Exam  Constitutional: He is oriented to person, place, and time. He appears well-developed and well-nourished. No distress.  HENT:  Head: Normocephalic and atraumatic.  Eyes: No scleral icterus.  Cardiovascular: Normal rate and regular rhythm.  Pulmonary/Chest: Effort normal.  Abdominal: Soft. He exhibits no distension. There is no tenderness.  Musculoskeletal: He exhibits no tenderness.  Neurological: He is alert and oriented to person, place, and time.  Skin: Skin is warm and dry.  Nursing note and vitals  reviewed.   Imaging: No results found.  Labs:  CBC: Recent Labs    04/21/18 1424 05/08/18 1222 05/14/18 1200 05/21/18 0815  WBC 12.4* 12.3* 12.3* 11.0*  HGB 10.5* 11.0* 11.1* 11.5*  HCT 33.2* 34.9* 34.9* 36.5*  PLT 634* 569* 389 304    COAGS: Recent Labs    11/03/17 0728 01/02/18 0705 01/26/18 1735 03/18/18 0728  INR 1.05 1.00 1.18 1.02  APTT 32  --  36  --     BMP: Recent Labs    04/21/18 1424 05/08/18 1222 05/14/18 1200 05/21/18 0815  NA 130* 134* 134* 138  K 4.7 4.6 4.5 4.0  CL 95* 97* 101 99  CO2 '26 29 27 30  '$ GLUCOSE 165* 106* 137* 96  BUN 15  $'10 12 8  'C$ CALCIUM 9.4 9.2 8.9 8.9  CREATININE 0.89 0.82 0.85 0.87  GFRNONAA >60 >60 >60 >60  GFRAA >60 >60 >60 >60    LIVER FUNCTION TESTS: Recent Labs    04/21/18 1424 05/08/18 1222 05/14/18 1200 05/21/18 0815  BILITOT <0.2* <0.2* 0.3 0.2*  AST 22 20 14* 18  ALT '12 18 10 14  '$ ALKPHOS 94 107 95 87  PROT 7.9 7.3 7.4 7.6  ALBUMIN 2.9* 3.0* 2.8* 2.9*    TUMOR MARKERS: No results for input(s): AFPTM, CEA, CA199, CHROMGRNA in the last 8760 hours.  Assessment and Plan:  [name] [name] was unwilling to use the interpretation services.  To the best of my ability, I cannot get him to endorse any symptoms related to the lytic lesion in L4.  The last imaging we have of the abnormality was 01/23/2018 which demonstrated a 1.3 x 1.4 cm lytic lesion in the left aspect of the L4 vertebral body.  The lesion does extend through the superior endplate, however there is no evidence of associated fracture.  The osteochondral procedure is currently only indicated in the setting of pathologic fracture or symptomatic vertebral metastasis.  At this time, I will plan on continuing to follow [name] [name] to assess for the development of symptoms or fracture in the future.  1.)  Return clinic visit in 3 months with MRI of the lumbar spine.  He will call if he develops symptoms between now and then necessitating evaluation.    Thank you for  this interesting consult.  I greatly enjoyed meeting [name] [name] and look forward to participating in their care.  A copy of this report was sent to the requesting provider on this date.  Electronically Signed: Jacqulynn Cadet 05/27/2018, 10:55 AM   I spent a total of  30 Minutes in face to face in clinical consultation, greater than 50% of which was counseling/coordinating care for L4 lytic lesion.

## 2018-05-27 NOTE — Telephone Encounter (Signed)
ATC- states phone number is not able to accept calls at this time.

## 2018-05-28 ENCOUNTER — Inpatient Hospital Stay: Payer: Self-pay

## 2018-05-28 ENCOUNTER — Encounter: Payer: Self-pay | Admitting: Nutrition

## 2018-05-28 VITALS — BP 109/72 | HR 87 | Temp 97.6°F | Resp 16

## 2018-05-28 DIAGNOSIS — C9000 Multiple myeloma not having achieved remission: Secondary | ICD-10-CM

## 2018-05-28 LAB — CMP (CANCER CENTER ONLY)
ALK PHOS: 75 U/L (ref 38–126)
ALT: 10 U/L (ref 0–44)
AST: 25 U/L (ref 15–41)
Albumin: 3 g/dL — ABNORMAL LOW (ref 3.5–5.0)
Anion gap: 7 (ref 5–15)
BILIRUBIN TOTAL: 0.3 mg/dL (ref 0.3–1.2)
BUN: 11 mg/dL (ref 6–20)
CALCIUM: 9.1 mg/dL (ref 8.9–10.3)
CO2: 31 mmol/L (ref 22–32)
CREATININE: 0.93 mg/dL (ref 0.61–1.24)
Chloride: 99 mmol/L (ref 98–111)
Glucose, Bld: 134 mg/dL — ABNORMAL HIGH (ref 70–99)
Potassium: 4.2 mmol/L (ref 3.5–5.1)
Sodium: 137 mmol/L (ref 135–145)
TOTAL PROTEIN: 7.4 g/dL (ref 6.5–8.1)

## 2018-05-28 LAB — CBC WITH DIFFERENTIAL/PLATELET
BASOS PCT: 0 %
Basophils Absolute: 0 10*3/uL (ref 0.0–0.1)
Eosinophils Absolute: 0.1 10*3/uL (ref 0.0–0.5)
Eosinophils Relative: 1 %
HCT: 35.2 % — ABNORMAL LOW (ref 38.4–49.9)
HEMOGLOBIN: 11.2 g/dL — AB (ref 13.0–17.1)
Lymphocytes Relative: 26 %
Lymphs Abs: 2.4 10*3/uL (ref 0.9–3.3)
MCH: 23.2 pg — ABNORMAL LOW (ref 27.2–33.4)
MCHC: 31.7 g/dL — ABNORMAL LOW (ref 32.0–36.0)
MCV: 73 fL — ABNORMAL LOW (ref 79.3–98.0)
Monocytes Absolute: 1 10*3/uL — ABNORMAL HIGH (ref 0.1–0.9)
Monocytes Relative: 11 %
NEUTROS PCT: 62 %
Neutro Abs: 5.7 10*3/uL (ref 1.5–6.5)
PLATELETS: 401 10*3/uL — AB (ref 140–400)
RBC: 4.82 MIL/uL (ref 4.20–5.82)
RDW: 20.2 % — ABNORMAL HIGH (ref 11.0–14.6)
WBC: 9.2 10*3/uL (ref 4.0–10.3)

## 2018-05-28 MED ORDER — PROCHLORPERAZINE MALEATE 10 MG PO TABS
ORAL_TABLET | ORAL | Status: AC
  Filled 2018-05-28: qty 1

## 2018-05-28 MED ORDER — BORTEZOMIB CHEMO SQ INJECTION 3.5 MG (2.5MG/ML)
1.3000 mg/m2 | Freq: Once | INTRAMUSCULAR | Status: AC
  Administered 2018-05-28: 1.75 mg via SUBCUTANEOUS
  Filled 2018-05-28: qty 0.7

## 2018-05-28 MED ORDER — PROCHLORPERAZINE MALEATE 10 MG PO TABS
10.0000 mg | ORAL_TABLET | Freq: Once | ORAL | Status: AC
  Administered 2018-05-28: 10 mg via ORAL

## 2018-05-28 NOTE — Telephone Encounter (Signed)
ATC- states phone number is not able to accept calls at this time. X4 Mailed letter in mail today for pt to contact our office. Nothing further needed at this time.

## 2018-05-28 NOTE — Patient Instructions (Signed)
[facility] Discharge Instructions for Patients Receiving Chemotherapy  Today you received the following chemotherapy agents Velcade.  To help prevent nausea and vomiting after your treatment, we encourage you to take your nausea medication as directed.  If you develop nausea and vomiting that is not controlled by your nausea medication, call the clinic.   BELOW ARE SYMPTOMS THAT SHOULD BE REPORTED IMMEDIATELY:  *FEVER GREATER THAN 100.5 F  *CHILLS WITH OR WITHOUT FEVER  NAUSEA AND VOMITING THAT IS NOT CONTROLLED WITH YOUR NAUSEA MEDICATION  *UNUSUAL SHORTNESS OF BREATH  *UNUSUAL BRUISING OR BLEEDING  TENDERNESS IN MOUTH AND THROAT WITH OR WITHOUT PRESENCE OF ULCERS  *URINARY PROBLEMS  *BOWEL PROBLEMS  UNUSUAL RASH Items with * indicate a potential emergency and should be followed up as soon as possible.  Feel free to call the clinic should you have any questions or concerns. The clinic phone number is [phone].  Please show the CHEMO ALERT CARD at check-in to the Emergency Department and triage nurse.   

## 2018-06-04 ENCOUNTER — Other Ambulatory Visit: Payer: Self-pay | Admitting: Medical

## 2018-06-04 ENCOUNTER — Inpatient Hospital Stay: Payer: Self-pay | Admitting: Medical

## 2018-06-04 ENCOUNTER — Inpatient Hospital Stay: Payer: Self-pay

## 2018-06-04 ENCOUNTER — Inpatient Hospital Stay: Payer: Self-pay | Attending: Hematology

## 2018-06-04 ENCOUNTER — Other Ambulatory Visit: Payer: Self-pay | Admitting: Hematology

## 2018-06-04 VITALS — BP 118/77 | HR 100 | Temp 98.8°F | Resp 16

## 2018-06-04 DIAGNOSIS — K219 Gastro-esophageal reflux disease without esophagitis: Secondary | ICD-10-CM

## 2018-06-04 DIAGNOSIS — G629 Polyneuropathy, unspecified: Secondary | ICD-10-CM | POA: Insufficient documentation

## 2018-06-04 DIAGNOSIS — M545 Low back pain: Secondary | ICD-10-CM | POA: Insufficient documentation

## 2018-06-04 DIAGNOSIS — Z5112 Encounter for antineoplastic immunotherapy: Secondary | ICD-10-CM | POA: Insufficient documentation

## 2018-06-04 DIAGNOSIS — C9000 Multiple myeloma not having achieved remission: Secondary | ICD-10-CM | POA: Insufficient documentation

## 2018-06-04 LAB — CMP (CANCER CENTER ONLY)
ALBUMIN: 3.2 g/dL — AB (ref 3.5–5.0)
ALT: 12 U/L (ref 0–44)
AST: 15 U/L (ref 15–41)
Alkaline Phosphatase: 88 U/L (ref 38–126)
Anion gap: 7 (ref 5–15)
BUN: 17 mg/dL (ref 6–20)
CHLORIDE: 101 mmol/L (ref 98–111)
CO2: 29 mmol/L (ref 22–32)
CREATININE: 0.88 mg/dL (ref 0.61–1.24)
Calcium: 9.3 mg/dL (ref 8.9–10.3)
GFR, Est AFR Am: >60 mL/min (ref >60)
GFR, Estimated: >60 mL/min (ref >60)
Glucose, Bld: 120 mg/dL — ABNORMAL HIGH (ref 70–99)
POTASSIUM: 3.7 mmol/L (ref 3.5–5.1)
SODIUM: 137 mmol/L (ref 135–145)
Total Bilirubin: 0.2 mg/dL — ABNORMAL LOW (ref 0.3–1.2)
Total Protein: 8.1 g/dL (ref 6.5–8.1)

## 2018-06-04 LAB — CBC WITH DIFFERENTIAL/PLATELET
BASOS ABS: 0 10*3/uL (ref 0.0–0.1)
BASOS PCT: 0 %
EOS ABS: 0.3 10*3/uL (ref 0.0–0.5)
EOS PCT: 2 %
HCT: 36 % — ABNORMAL LOW (ref 38.4–49.9)
Hemoglobin: 11.5 g/dL — ABNORMAL LOW (ref 13.0–17.1)
LYMPHS PCT: 13 %
Lymphs Abs: 1.8 10*3/uL (ref 0.9–3.3)
MCH: 23.4 pg — ABNORMAL LOW (ref 27.2–33.4)
MCHC: 31.9 g/dL — ABNORMAL LOW (ref 32.0–36.0)
MCV: 73.2 fL — AB (ref 79.3–98.0)
Monocytes Absolute: 1.3 10*3/uL — ABNORMAL HIGH (ref 0.1–0.9)
Monocytes Relative: 9 %
Neutro Abs: 11.2 10*3/uL — ABNORMAL HIGH (ref 1.5–6.5)
Neutrophils Relative %: 76 %
PLATELETS: 451 10*3/uL — AB (ref 140–400)
RBC: 4.92 MIL/uL (ref 4.20–5.82)
RDW: 18.8 % — ABNORMAL HIGH (ref 11.0–14.6)
WBC: 14.7 10*3/uL — AB (ref 4.0–10.3)

## 2018-06-04 MED ORDER — BORTEZOMIB CHEMO SQ INJECTION 3.5 MG (2.5MG/ML)
1.3000 mg/m2 | Freq: Once | INTRAMUSCULAR | Status: AC
  Administered 2018-06-04: 1.75 mg via SUBCUTANEOUS
  Filled 2018-06-04: qty 0.7

## 2018-06-04 MED ORDER — PROCHLORPERAZINE MALEATE 10 MG PO TABS
ORAL_TABLET | ORAL | Status: AC
  Filled 2018-06-04: qty 1

## 2018-06-04 MED ORDER — OMEPRAZOLE 40 MG PO CPDR: 40.0000 mg | DELAYED_RELEASE_CAPSULE | Freq: Every day | ORAL | 5 refills | Status: DC

## 2018-06-04 MED ORDER — PROCHLORPERAZINE MALEATE 10 MG PO TABS
10.0000 mg | ORAL_TABLET | Freq: Once | ORAL | Status: AC
  Administered 2018-06-04: 10 mg via ORAL

## 2018-06-04 MED FILL — OMEPRAZOLE 40 MG CPDR: 40 | 30 days supply | Qty: 30 | Fill #0

## 2018-06-04 NOTE — Patient Instructions (Signed)
[facility] Discharge Instructions for Patients Receiving Chemotherapy  Today you received the following chemotherapy agents velcade  To help prevent nausea and vomiting after your treatment, we encourage you to take your nausea medication as directed.   If you develop nausea and vomiting that is not controlled by your nausea medication, call the clinic.   BELOW ARE SYMPTOMS THAT SHOULD BE REPORTED IMMEDIATELY:  *FEVER GREATER THAN 100.5 F  *CHILLS WITH OR WITHOUT FEVER  NAUSEA AND VOMITING THAT IS NOT CONTROLLED WITH YOUR NAUSEA MEDICATION  *UNUSUAL SHORTNESS OF BREATH  *UNUSUAL BRUISING OR BLEEDING  TENDERNESS IN MOUTH AND THROAT WITH OR WITHOUT PRESENCE OF ULCERS  *URINARY PROBLEMS  *BOWEL PROBLEMS  UNUSUAL RASH Items with * indicate a potential emergency and should be followed up as soon as possible.  Feel free to call the clinic should you have any questions or concerns. The clinic phone number is [phone].  Please show the CHEMO ALERT CARD at check-in to the Emergency Department and triage nurse.   

## 2018-06-05 ENCOUNTER — Other Ambulatory Visit: Payer: Self-pay | Admitting: Medical

## 2018-06-05 NOTE — Progress Notes (Signed)
[name] [name] was seen in infusion today.  He reported having some stomach upset with GERD.  He reports that he is not taking any medication for this.  Review of his chart shows that he had a prescription for Prilosec.  He states that he is not taking this medication.  He denies nausea, vomiting, constipation or diarrhea.  Plans are to send a another prescription for Prilosec 40 mg once daily to [place].

## 2018-06-08 LAB — MULTIPLE MYELOMA PANEL, SERUM
ALBUMIN/GLOB SERPL: 0.7 (ref 0.7–1.7)
ALPHA 1: 0.3 g/dL (ref 0.0–0.4)
Albumin SerPl Elph-Mcnc: 2.9 g/dL (ref 2.9–4.4)
Alpha2 Glob SerPl Elph-Mcnc: 0.9 g/dL (ref 0.4–1.0)
B-Globulin SerPl Elph-Mcnc: 1.1 g/dL (ref 0.7–1.3)
Gamma Glob SerPl Elph-Mcnc: 1.9 g/dL — ABNORMAL HIGH (ref 0.4–1.8)
Globulin, Total: 4.2 g/dL — ABNORMAL HIGH (ref 2.2–3.9)
IGA: 70 mg/dL — AB (ref 90–386)
IGM (IMMUNOGLOBULIN M), SRM: 39 mg/dL (ref 20–172)
IgG (Immunoglobin G), Serum: 1856 mg/dL — ABNORMAL HIGH (ref 700–1600)
M Protein SerPl Elph-Mcnc: 1.3 g/dL — ABNORMAL HIGH
Total Protein ELP: 7.1 g/dL (ref 6.0–8.5)

## 2018-06-09 ENCOUNTER — Other Ambulatory Visit: Payer: Self-pay | Admitting: Hematology and Oncology

## 2018-06-09 ENCOUNTER — Other Ambulatory Visit: Payer: Self-pay | Admitting: Hematology

## 2018-06-09 MED ORDER — HYDROMORPHONE HCL 2 MG PO TABS: 2.0000 mg | ORAL_TABLET | ORAL | 0 refills | Status: AC | PRN

## 2018-06-09 MED FILL — HYDROmorphone HCL 2 MG TABS: 2 | 9 days supply | Qty: 50 | Fill #0

## 2018-06-10 NOTE — Progress Notes (Signed)
HEMATOLOGY/ONCOLOGY CONSULTATION NOTE  Date of Service: 06/11/2018  Patient Care Team: [name], PA-C as PCP - General (Physician Assistant) Michel Bickers, MD as Consulting Physician (Infectious Diseases) Brand Males, MD as Consulting Physician (Pulmonary Disease)  CHIEF COMPLAINTS/PURPOSE OF CONSULTATION:  Multiple myeloma  Oncologic History:  57 y.o.  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  Based on the diagnosis, patient was started on treatment with bortezomib & low-dose dexamethasone.  Initially, lenalidomide was omitted out of concern for high risk of for infectious complications.  We have continued to therapy with just a doublet treatment and did not reintensify the therapy.  Treatment course has been complicated by exacerbation of pulmonary hypertension and now with shingles reactivation resulting in blistering rash and skin ulcerations on the left flank.  Infection appears to be responding to higher dose of acyclovir and skin is healing.   HISTORY OF PRESENTING ILLNESS:   [name] [name] is a wonderful 57 y.o. male who has been referred to [place] by my colleague Dr [name] for evaluation and management of Multiple myeloma. He is accompanied today by a Optometrist. The pt reports that he is doing well overall.   The pt has been followed by my colleague Dr [name] and has been treated with Velcade and dexamethasone.    The pt reports that he has lost some weight and hasn't eaten as much because he hasn't been able to taste his food as well.   He notes that his shingles rash has continued to clear up and he denies pain associated with his remaining rash. His back pain continues and he has run out of his Hydromorphone. He notes that his back  pain has not gotten better since he was first evaluated. He notes that he feels tired and has a little SOB.   Most recent lab results (04/07/18) of CBC and CMP is as follows: all values are WNL except for Sodium at 134, chloride at 96, Total Protein at 8.7, Albumin at 3.0, Total bilirubin <0.2, WBC at 19.1k, HGB at 10.7, HCT at 33.2, MCV at 73.5, MCH at 23.7, RDW at 18.4, Platelets at 637k, ANC at 17.1k.Marland Kitchen MMP 04/07/18 showed IgG elevated at 2301 and M Protein at 1.5. Ferritin 04/07/18 was elevated at 436 Iron/TIBC 04/07/18 showed at 12% saturation ratio LDH 04/07/18 is WNL at 171 Kappa/Lambda 04/07/18 showed Kappa at 25.2 and K:L ratio at 2.27  On review of systems, pt reports some SOB, back pain, resolving shingles rash, mouth sores, weight loss, and denies fevers, chills, new skin rashes, and any other symptoms.  Interval History:   [name] [name] returns today for management and evaluation of his multiple myeloma. The patient's last visit with [place] was on 05/14/18. He is accompanied today by a Optometrist. The pt reports that he is doing well overall.   The pt reports that his back pain has decreased, and he was evaluated by IR for a vertebroplasty. He is taking Dilaudid once each day, at night before bed. His feelings of numbness in his toes and hands has not worsened.   The pt notes that Dr. [name], his pulmonologist, is managing his Tadalafil but is unsure of if he has a follow up. Review of the pt's chart shows that Dr. Lake Bells has tried to contact  the without success and I informed the pt he should return call to Dr. Anastasia Pall office. Pt notes he will do this.   Lab results (06/11/18) of CBC w/diff, CMP is as follows: all values are WNL except for WBC at 12.6k, HGB at 11.2, HCT at 35.2, MCV at 73.8, MCH at 23.5, MCHC at 31.8, RDW at 19.0, PLT at 440k, ANC at 8.5k, Monocytes abs at 1.6k, Chloride at 95, Glucose at 105, Total Protein at 8.3, Albumin at 3.2, Total Bilirubin at <0.2.. MMP 06/04/18 revealed  all values WNL except for IgG at 1856, IgA at 70, Gamma glob at 1.9, M Protein at 1.3, Globulin at 4.2.  On review of systems, pt reports stable breathing effort, decreased back pain, stable numbness in his hands and toes, stable weight, and denies fevers, chills, constipation, new bone pains, and any other symptoms.    MEDICAL HISTORY:  Past Medical History:  Diagnosis Date  . Acute on chronic respiratory failure ([place]) 02/07/2018  . Hypertension   . Kidney stones   . Pulmonary hypertension ([place])     SURGICAL HISTORY: Past Surgical History:  Procedure Laterality Date  . CARDIAC CATHETERIZATION N/A 01/18/2016   Procedure: Right Heart Cath;  Surgeon: Jolaine Artist, MD;  Location: [facility];  Service: Cardiovascular;  Laterality: N/A;  . CARDIAC CATHETERIZATION N/A 06/24/2016   Procedure: Right Heart Cath;  Surgeon: Jolaine Artist, MD;  Location: [facility];  Service: Cardiovascular;  Laterality: N/A;  . IR RADIOLOGIST EVAL & MGMT  05/27/2018  . KIDNEY SURGERY    . VIDEO ASSISTED THORACOSCOPY (VATS)/DECORTICATION Left 2003  . VIDEO ASSISTED THORACOSCOPY (VATS)/DECORTICATION Left 10/02/2015   Procedure: VIDEO ASSISTED THORACOSCOPY (VATS)/DECORTICATION and drainage of chronic empyena;  Surgeon: [name], MD;  Location: [facility];  Service: Thoracic;  Laterality: Left;  Marland Kitchen VIDEO BRONCHOSCOPY N/A 10/02/2015   Procedure: VIDEO BRONCHOSCOPY;  Surgeon: [name], MD;  Location: [facility];  Service: Thoracic;  Laterality: N/A;    SOCIAL HISTORY: Social History   Socioeconomic History  . Marital status: Single    Spouse name: Not on file  . Number of children: Not on file  . Years of education: Not on file  . Highest education level: Not on file  Occupational History  . Not on file  Social Needs  . Financial resource strain: Not on file  . Food insecurity:    Worry: Not on file    Inability: Not on file  . Transportation needs:    Medical: Not on file      Non-medical: Not on file  Tobacco Use  . Smoking status: Never Smoker  . Smokeless tobacco: Never Used  Substance and Sexual Activity  . Alcohol use: Yes  . Drug use: No  . Sexual activity: Not on file  Lifestyle  . Physical activity:    Days per week: Not on file    Minutes per session: Not on file  . Stress: Not on file  Relationships  . Social connections:    Talks on phone: Not on file    Gets together: Not on file    Attends religious service: Not on file    Active member of club or organization: Not on file    Attends meetings of clubs or organizations: Not on file    Relationship status: Not on file  . Intimate partner violence:    Fear of current or ex partner: Not on file    Emotionally abused:  Not on file    Physically abused: Not on file    Forced sexual activity: Not on file  Other Topics Concern  . Not on file  Social History Narrative  . Not on file    FAMILY HISTORY: Family History  Problem Relation Age of Onset  . Hypertension Other     ALLERGIES:  has No Known Allergies.  MEDICATIONS:  Current Outpatient Medications  Medication Sig Dispense Refill  . acyclovir (ZOVIRAX) 400 MG tablet Take 400 mg by mouth daily.  3  . albuterol (PROVENTIL HFA;VENTOLIN HFA) 108 (90 Base) MCG/ACT inhaler Inhale 2 puffs into the lungs every 6 (six) hours as needed for wheezing or shortness of breath. 1 Inhaler 5  . docusate sodium (COLACE) 50 MG capsule Take 1 capsule (50 mg total) by mouth daily. 30 capsule 0  . HYDROmorphone (DILAUDID) 2 MG tablet Take 1 tablet (2 mg total) by mouth every 4 (four) hours as needed for severe pain. 50 tablet 0  . nortriptyline (PAMELOR) 25 MG capsule Take 1 capsule (25 mg total) by mouth at bedtime. 30 capsule 3  . nortriptyline (PAMELOR) 25 MG capsule Take 25 mg by mouth.  3  . omeprazole (PRILOSEC) 40 MG capsule Take 1 capsule (40 mg total) by mouth daily. 30 capsule 5  . OXYGEN 2lpm BEDTIME ONLY    . tadalafil, PAH, (ADCIRCA) 20  MG tablet Take 2 tablets (40 mg total) by mouth daily. (Patient not taking: Reported on 05/08/2018) 60 tablet 11  . umeclidinium-vilanterol (ANORO ELLIPTA) 62.5-25 MCG/INH AEPB Inhale 1 puff into the lungs daily. 60 each 5  . umeclidinium-vilanterol (ANORO ELLIPTA) 62.5-25 MCG/INH AEPB Inhale 1 puff into the lungs daily. 1 each 0   No current facility-administered medications for this visit.     REVIEW OF SYSTEMS:    A 10+ POINT REVIEW OF SYSTEMS WAS OBTAINED including neurology, dermatology, psychiatry, cardiac, respiratory, lymph, extremities, GI, GU, Musculoskeletal, constitutional, breasts, reproductive, HEENT.  All pertinent positives are noted in the HPI.  All others are negative.   PHYSICAL EXAMINATION: ECOG PERFORMANCE STATUS: 2 - Symptomatic, <50% confined to bed  Vitals:   06/11/18 1355  BP: 116/80  Pulse: (!) 103  Resp: 18  Temp: 98.5 F (36.9 C)  SpO2: 97%   Filed Weights   06/11/18 1355  Weight: 89 lb 3.2 oz (40.5 kg)   .Body mass index is 17.42 kg/m.  GENERAL:alert, in no acute distress and comfortable SKIN: no acute rashes, no significant lesions EYES: conjunctiva are pink and non-injected, sclera anicteric OROPHARYNX: MMM, no exudates, no oropharyngeal erythema or ulceration NECK: supple, no JVD LYMPH:  no palpable lymphadenopathy in the cervical, axillary or inguinal regions LUNGS: clear to auscultation b/l with normal respiratory effort HEART: regular rate & rhythm ABDOMEN:  normoactive bowel sounds , non tender, not distended. No palpable hepatosplenomegaly.  Extremity: no pedal edema PSYCH: alert & oriented x 3 with fluent speech NEURO: no focal motor/sensory deficits   LABORATORY DATA:  I have reviewed the data as listed  . CBC Latest Ref Rng & Units 06/11/2018 06/04/2018 05/28/2018  WBC 4.0 - 10.3 K/uL 12.6(H) 14.7(H) 9.2  Hemoglobin 13.0 - 17.1 g/dL 11.2(L) 11.5(L) 11.2(L)  Hematocrit 38.4 - 49.9 % 35.2(L) 36.0(L) 35.2(L)  Platelets 140 - 400 K/uL  440(H) 451(H) 401(H)   . CBC    Component Value Date/Time   WBC 12.6 (H) 06/11/2018 1314   RBC 4.77 06/11/2018 1314   HGB 11.2 (L) 06/11/2018 1314   HGB  10.7 (L) 04/07/2018 1429   HGB 9.5 (L) 03/05/2018 1206   HGB 13.1 11/07/2017 1014   HCT 35.2 (L) 06/11/2018 1314   HCT 32.3 (L) 03/05/2018 1206   HCT 39.7 11/07/2017 1014   PLT 440 (H) 06/11/2018 1314   PLT 637 (H) 04/07/2018 1429   PLT 474 (H) 03/05/2018 1206   MCV 73.8 (L) 06/11/2018 1314   MCV 82 03/05/2018 1206   MCV 87.6 11/07/2017 1014   MCH 23.5 (L) 06/11/2018 1314   MCHC 31.8 (L) 06/11/2018 1314   RDW 19.0 (H) 06/11/2018 1314   RDW 20.5 (H) 03/05/2018 1206   RDW 14.5 11/07/2017 1014   LYMPHSABS 2.4 06/11/2018 1314   LYMPHSABS 1.5 03/05/2018 1206   LYMPHSABS 1.7 11/07/2017 1014   MONOABS 1.6 (H) 06/11/2018 1314   MONOABS 0.8 11/07/2017 1014   EOSABS 0.2 06/11/2018 1314   EOSABS 0.4 03/05/2018 1206   BASOSABS 0.0 06/11/2018 1314   BASOSABS 0.1 03/05/2018 1206   BASOSABS 0.0 11/07/2017 1014    . CMP Latest Ref Rng & Units 06/11/2018 06/04/2018 05/28/2018  Glucose 70 - 99 mg/dL 105(H) 120(H) 134(H)  BUN 6 - 20 mg/dL '13 17 11  '$ Creatinine 0.61 - 1.24 mg/dL 0.86 0.88 0.93  Sodium 135 - 145 mmol/L 135 137 137  Potassium 3.5 - 5.1 mmol/L 4.5 3.7 4.2  Chloride 98 - 111 mmol/L 95(L) 101 99  CO2 22 - 32 mmol/L '29 29 31  '$ Calcium 8.9 - 10.3 mg/dL 9.5 9.3 9.1  Total Protein 6.5 - 8.1 g/dL 8.3(H) 8.1 7.4  Total Bilirubin 0.3 - 1.2 mg/dL <0.2(L) 0.2(L) 0.3  Alkaline Phos 38 - 126 U/L 83 88 75  AST 15 - 41 U/L '18 15 25  '$ ALT 0 - 44 U/L '12 12 10   '$ 03/18/18 Cytogenetics:  03/18/18 Pathology:  11/03/17 Cytogenetics:    RADIOGRAPHIC STUDIES: I have personally reviewed the radiological images as listed and agreed with the findings in the report. Ir Radiologist Eval & Mgmt  Result Date: 05/27/2018 Please refer to notes tab for details about interventional procedure. (Op Note)   ASSESSMENT & PLAN:   57 y.o. male  with  1. 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  Based on the diagnosis, patient was started on treatment with bortezomib & low-dose dexamethasone.  Initially, lenalidomide was omitted out of concern for high risk of for infectious complications.  We have continued to therapy with just a doublet treatment and did not reintensify the therapy.  Treatment course has been complicated by exacerbation of pulmonary hypertension and now with shingles reactivation BM 03/18/18 shows limited response with still 18% plasma cell in the bone marrow. Cytogenetics shows  14.5% [id number]  PLAN:  -Discussed that we will consider changing treatment if his M protein level does not response further.  -Discussed that fluid collection around lung limits the agressiveness of treatment options - if more aggressive treatment is indicated, would need pulmonology clearance -Will see pt back in one month  -The pt has no prohibitive toxicities from continuing Velcade at this time.   -Discussed pt labwork from 06/04/18; IgG decreased to 1856, IgA at 70, M Protein slightly decreased to 1.3. HGB stable at 11.2, ANC decreased to 8.5k, PLT stable at 440k -Pleural fluid collection is limiting treatment options, let pt know that Dr. 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 Lake Bells -Will see pt back in one month -The pt has no prohibitive toxicities from continuing Velcade at this time.   -Will refill Dexamethasone today    Please next 2 months of weekly Velcade treatment with labs RTC with Dr Irene Limbo in 4 weeks   All of the patients questions were answered with  apparent satisfaction. The patient knows to call the clinic with any problems, questions or concerns.  The total time spent in the appt was 20 minutes and more than 50% was on counseling and direct patient cares.     Sullivan Lone MD MS AAHIVMS [facility] Hematology/Oncology Physician [facility]  (Office):       [phone] (Work cell):  [phone] (Fax):           [phone]  06/11/2018 2:54 PM  I, Baldwin Jamaica, am acting as a scribe for Dr. Irene Limbo  .I have reviewed the above documentation for accuracy and completeness, and I agree with the above. Brunetta Genera MD

## 2018-06-11 ENCOUNTER — Inpatient Hospital Stay: Payer: Self-pay

## 2018-06-11 ENCOUNTER — Telehealth: Payer: Self-pay | Admitting: Hematology

## 2018-06-11 ENCOUNTER — Inpatient Hospital Stay (HOSPITAL_BASED_OUTPATIENT_CLINIC_OR_DEPARTMENT_OTHER): Payer: Self-pay | Admitting: Hematology

## 2018-06-11 VITALS — BP 116/80 | HR 103 | Temp 98.5°F | Resp 18 | Ht 60.0 in | Wt 89.2 lb

## 2018-06-11 DIAGNOSIS — C9000 Multiple myeloma not having achieved remission: Secondary | ICD-10-CM

## 2018-06-11 DIAGNOSIS — M545 Low back pain: Secondary | ICD-10-CM

## 2018-06-11 DIAGNOSIS — G629 Polyneuropathy, unspecified: Secondary | ICD-10-CM

## 2018-06-11 LAB — CMP (CANCER CENTER ONLY)
ALK PHOS: 83 U/L (ref 38–126)
ALT: 12 U/L (ref 0–44)
ANION GAP: 11 (ref 5–15)
AST: 18 U/L (ref 15–41)
Albumin: 3.2 g/dL — ABNORMAL LOW (ref 3.5–5.0)
BUN: 13 mg/dL (ref 6–20)
CALCIUM: 9.5 mg/dL (ref 8.9–10.3)
CO2: 29 mmol/L (ref 22–32)
CREATININE: 0.86 mg/dL (ref 0.61–1.24)
Chloride: 95 mmol/L — ABNORMAL LOW (ref 98–111)
Glucose, Bld: 105 mg/dL — ABNORMAL HIGH (ref 70–99)
Potassium: 4.5 mmol/L (ref 3.5–5.1)
SODIUM: 135 mmol/L (ref 135–145)
TOTAL PROTEIN: 8.3 g/dL — AB (ref 6.5–8.1)

## 2018-06-11 LAB — CBC WITH DIFFERENTIAL/PLATELET
BASOS ABS: 0 10*3/uL (ref 0.0–0.1)
BASOS PCT: 0 %
Eosinophils Absolute: 0.2 10*3/uL (ref 0.0–0.5)
Eosinophils Relative: 1 %
HEMATOCRIT: 35.2 % — AB (ref 38.4–49.9)
HEMOGLOBIN: 11.2 g/dL — AB (ref 13.0–17.1)
Lymphocytes Relative: 19 %
Lymphs Abs: 2.4 10*3/uL (ref 0.9–3.3)
MCH: 23.5 pg — ABNORMAL LOW (ref 27.2–33.4)
MCHC: 31.8 g/dL — AB (ref 32.0–36.0)
MCV: 73.8 fL — ABNORMAL LOW (ref 79.3–98.0)
MONOS PCT: 12 %
Monocytes Absolute: 1.6 10*3/uL — ABNORMAL HIGH (ref 0.1–0.9)
NEUTROS ABS: 8.5 10*3/uL — AB (ref 1.5–6.5)
NEUTROS PCT: 68 %
Platelets: 440 10*3/uL — ABNORMAL HIGH (ref 140–400)
RBC: 4.77 MIL/uL (ref 4.20–5.82)
RDW: 19 % — ABNORMAL HIGH (ref 11.0–14.6)
WBC: 12.6 10*3/uL — AB (ref 4.0–10.3)

## 2018-06-11 MED ORDER — PROCHLORPERAZINE MALEATE 10 MG PO TABS
10.0000 mg | ORAL_TABLET | Freq: Once | ORAL | Status: AC
  Administered 2018-06-11: 10 mg via ORAL

## 2018-06-11 MED ORDER — BORTEZOMIB CHEMO SQ INJECTION 3.5 MG (2.5MG/ML)
1.3000 mg/m2 | Freq: Once | INTRAMUSCULAR | Status: AC
  Administered 2018-06-11: 1.75 mg via SUBCUTANEOUS
  Filled 2018-06-11: qty 0.7

## 2018-06-11 MED ORDER — PROCHLORPERAZINE MALEATE 10 MG PO TABS
ORAL_TABLET | ORAL | Status: AC
  Filled 2018-06-11: qty 1

## 2018-06-11 NOTE — Telephone Encounter (Signed)
Appointments scheduled AVS/Calendar printed per 8/8 los °

## 2018-06-11 NOTE — Patient Instructions (Signed)
[facility] Discharge Instructions for Patients Receiving Chemotherapy  Today you received the following chemotherapy agents Velcade.  To help prevent nausea and vomiting after your treatment, we encourage you to take your nausea medication as directed.  If you develop nausea and vomiting that is not controlled by your nausea medication, call the clinic.   BELOW ARE SYMPTOMS THAT SHOULD BE REPORTED IMMEDIATELY:  *FEVER GREATER THAN 100.5 F  *CHILLS WITH OR WITHOUT FEVER  NAUSEA AND VOMITING THAT IS NOT CONTROLLED WITH YOUR NAUSEA MEDICATION  *UNUSUAL SHORTNESS OF BREATH  *UNUSUAL BRUISING OR BLEEDING  TENDERNESS IN MOUTH AND THROAT WITH OR WITHOUT PRESENCE OF ULCERS  *URINARY PROBLEMS  *BOWEL PROBLEMS  UNUSUAL RASH Items with * indicate a potential emergency and should be followed up as soon as possible.  Feel free to call the clinic should you have any questions or concerns. The clinic phone number is [phone].  Please show the CHEMO ALERT CARD at check-in to the Emergency Department and triage nurse.   

## 2018-06-16 MED FILL — DEXAMETHASONE 4 MG TABLET: 4 | 28 days supply | Qty: 20 | Fill #2

## 2018-06-18 ENCOUNTER — Inpatient Hospital Stay: Payer: Self-pay

## 2018-06-18 VITALS — BP 133/72 | HR 98 | Temp 98.2°F | Resp 18

## 2018-06-18 DIAGNOSIS — Z7189 Other specified counseling: Secondary | ICD-10-CM

## 2018-06-18 DIAGNOSIS — C9000 Multiple myeloma not having achieved remission: Secondary | ICD-10-CM

## 2018-06-18 LAB — CBC WITH DIFFERENTIAL/PLATELET
BASOS PCT: 0 %
Basophils Absolute: 0 10*3/uL (ref 0.0–0.1)
EOS PCT: 0 %
Eosinophils Absolute: 0 10*3/uL (ref 0.0–0.5)
HCT: 33.5 % — ABNORMAL LOW (ref 38.4–49.9)
Hemoglobin: 10.7 g/dL — ABNORMAL LOW (ref 13.0–17.1)
Lymphocytes Relative: 5 %
Lymphs Abs: 0.9 10*3/uL (ref 0.9–3.3)
MCH: 23.5 pg — ABNORMAL LOW (ref 27.2–33.4)
MCHC: 31.9 g/dL — AB (ref 32.0–36.0)
MCV: 73.6 fL — ABNORMAL LOW (ref 79.3–98.0)
MONO ABS: 1 10*3/uL — AB (ref 0.1–0.9)
MONOS PCT: 6 %
NEUTROS PCT: 89 %
Neutro Abs: 14.9 10*3/uL — ABNORMAL HIGH (ref 1.5–6.5)
PLATELETS: 519 10*3/uL — AB (ref 140–400)
RBC: 4.55 MIL/uL (ref 4.20–5.82)
RDW: 19 % — AB (ref 11.0–14.6)
WBC: 16.7 10*3/uL — ABNORMAL HIGH (ref 4.0–10.3)

## 2018-06-18 LAB — CMP (CANCER CENTER ONLY)
ALT: 9 U/L (ref 0–44)
AST: 12 U/L — ABNORMAL LOW (ref 15–41)
Albumin: 3.2 g/dL — ABNORMAL LOW (ref 3.5–5.0)
Alkaline Phosphatase: 78 U/L (ref 38–126)
Anion gap: 13 (ref 5–15)
BUN: 19 mg/dL (ref 6–20)
CO2: 25 mmol/L (ref 22–32)
CREATININE: 0.89 mg/dL (ref 0.61–1.24)
Calcium: 9.8 mg/dL (ref 8.9–10.3)
Chloride: 98 mmol/L (ref 98–111)
GFR, Estimated: >60 mL/min (ref >60)
Glucose, Bld: 121 mg/dL — ABNORMAL HIGH (ref 70–99)
Potassium: 4.5 mmol/L (ref 3.5–5.1)
SODIUM: 136 mmol/L (ref 135–145)
Total Bilirubin: 0.2 mg/dL — ABNORMAL LOW (ref 0.3–1.2)
Total Protein: 8.6 g/dL — ABNORMAL HIGH (ref 6.5–8.1)

## 2018-06-18 MED ORDER — ZOLEDRONIC ACID 4 MG/5ML IV CONC
3.5000 mg | Freq: Once | INTRAVENOUS | Status: AC
  Administered 2018-06-18: 3.5 mg via INTRAVENOUS
  Filled 2018-06-18: qty 4.38

## 2018-06-18 MED ORDER — PROCHLORPERAZINE MALEATE 10 MG PO TABS
10.0000 mg | ORAL_TABLET | Freq: Once | ORAL | Status: AC
  Administered 2018-06-18: 10 mg via ORAL

## 2018-06-18 MED ORDER — SODIUM CHLORIDE 0.9 % IV SOLN
INTRAVENOUS | Status: DC
  Administered 2018-06-18: 15:00:00 via INTRAVENOUS
  Filled 2018-06-18: qty 250

## 2018-06-18 MED ORDER — PROCHLORPERAZINE MALEATE 10 MG PO TABS
ORAL_TABLET | ORAL | Status: AC
  Filled 2018-06-18: qty 1

## 2018-06-18 MED ORDER — BORTEZOMIB CHEMO SQ INJECTION 3.5 MG (2.5MG/ML)
1.3000 mg/m2 | Freq: Once | INTRAMUSCULAR | Status: AC
  Administered 2018-06-18: 1.75 mg via SUBCUTANEOUS
  Filled 2018-06-18: qty 0.7

## 2018-06-18 NOTE — Patient Instructions (Signed)
[facility] Discharge Instructions for Patients Receiving Chemotherapy  Today you received the following chemotherapy agents: Velcade and Zometa  To help prevent nausea and vomiting after your treatment, we encourage you to take your nausea medication as directed.   If you develop nausea and vomiting that is not controlled by your nausea medication, call the clinic.   BELOW ARE SYMPTOMS THAT SHOULD BE REPORTED IMMEDIATELY:  *FEVER GREATER THAN 100.5 F  *CHILLS WITH OR WITHOUT FEVER  NAUSEA AND VOMITING THAT IS NOT CONTROLLED WITH YOUR NAUSEA MEDICATION  *UNUSUAL SHORTNESS OF BREATH  *UNUSUAL BRUISING OR BLEEDING  TENDERNESS IN MOUTH AND THROAT WITH OR WITHOUT PRESENCE OF ULCERS  *URINARY PROBLEMS  *BOWEL PROBLEMS  UNUSUAL RASH Items with * indicate a potential emergency and should be followed up as soon as possible.  Feel free to call the clinic should you have any questions or concerns. The clinic phone number is [phone].  Please show the CHEMO ALERT CARD at check-in to the Emergency Department and triage nurse.   

## 2018-06-24 MED FILL — ACYCLOVIR 400 MG TABLET: 400 | 30 days supply | Qty: 60 | Fill #3

## 2018-06-24 MED FILL — NORTRIPTYLINE HCL 25 MG CAP: 25 | 30 days supply | Qty: 30 | Fill #3

## 2018-06-25 ENCOUNTER — Inpatient Hospital Stay: Payer: Self-pay

## 2018-06-25 ENCOUNTER — Ambulatory Visit (INDEPENDENT_AMBULATORY_CARE_PROVIDER_SITE_OTHER): Payer: Self-pay | Admitting: Pulmonary Disease

## 2018-06-25 ENCOUNTER — Encounter: Payer: Self-pay | Admitting: Pulmonary Disease

## 2018-06-25 ENCOUNTER — Other Ambulatory Visit: Payer: Self-pay

## 2018-06-25 VITALS — BP 112/70 | HR 100 | Ht 59.84 in | Wt 88.2 lb

## 2018-06-25 VITALS — BP 128/90 | HR 91 | Temp 98.6°F | Resp 18

## 2018-06-25 DIAGNOSIS — J479 Bronchiectasis, uncomplicated: Secondary | ICD-10-CM

## 2018-06-25 DIAGNOSIS — J9611 Chronic respiratory failure with hypoxia: Secondary | ICD-10-CM

## 2018-06-25 DIAGNOSIS — C9000 Multiple myeloma not having achieved remission: Secondary | ICD-10-CM

## 2018-06-25 DIAGNOSIS — J449 Chronic obstructive pulmonary disease, unspecified: Secondary | ICD-10-CM

## 2018-06-25 DIAGNOSIS — Z23 Encounter for immunization: Secondary | ICD-10-CM

## 2018-06-25 DIAGNOSIS — I272 Pulmonary hypertension, unspecified: Secondary | ICD-10-CM

## 2018-06-25 LAB — CBC WITH DIFFERENTIAL/PLATELET
BASOS ABS: 0.1 10*3/uL (ref 0.0–0.1)
Basophils Relative: 1 %
Eosinophils Absolute: 0.2 10*3/uL (ref 0.0–0.5)
Eosinophils Relative: 2 %
HEMATOCRIT: 36.1 % — AB (ref 38.4–49.9)
Hemoglobin: 11.3 g/dL — ABNORMAL LOW (ref 13.0–17.1)
LYMPHS PCT: 16 %
Lymphs Abs: 1.8 10*3/uL (ref 0.9–3.3)
MCH: 23.4 pg — ABNORMAL LOW (ref 27.2–33.4)
MCHC: 31.2 g/dL — ABNORMAL LOW (ref 32.0–36.0)
MCV: 74.8 fL — AB (ref 79.3–98.0)
Monocytes Absolute: 1.1 10*3/uL — ABNORMAL HIGH (ref 0.1–0.9)
Monocytes Relative: 10 %
NEUTROS ABS: 8.4 10*3/uL — AB (ref 1.5–6.5)
Neutrophils Relative %: 71 %
Platelets: 446 10*3/uL — ABNORMAL HIGH (ref 140–400)
RBC: 4.82 MIL/uL (ref 4.20–5.82)
RDW: 21.5 % — ABNORMAL HIGH (ref 11.0–14.6)
WBC: 11.5 10*3/uL — AB (ref 4.0–10.3)

## 2018-06-25 LAB — CMP (CANCER CENTER ONLY)
ALBUMIN: 3 g/dL — AB (ref 3.5–5.0)
ALT: 15 U/L (ref 0–44)
AST: 18 U/L (ref 15–41)
Alkaline Phosphatase: 94 U/L (ref 38–126)
Anion gap: 7 (ref 5–15)
BILIRUBIN TOTAL: 0.3 mg/dL (ref 0.3–1.2)
BUN: 16 mg/dL (ref 6–20)
CHLORIDE: 102 mmol/L (ref 98–111)
CO2: 29 mmol/L (ref 22–32)
Calcium: 8.8 mg/dL — ABNORMAL LOW (ref 8.9–10.3)
Creatinine: 0.84 mg/dL (ref 0.61–1.24)
GFR, Est AFR Am: >60 mL/min (ref >60)
GFR, Estimated: >60 mL/min (ref >60)
GLUCOSE: 104 mg/dL — AB (ref 70–99)
POTASSIUM: 3.9 mmol/L (ref 3.5–5.1)
Sodium: 138 mmol/L (ref 135–145)
TOTAL PROTEIN: 7.7 g/dL (ref 6.5–8.1)

## 2018-06-25 MED ORDER — FLUTICASONE-UMECLIDIN-VILANT 100-62.5-25 MCG/INH IN AEPB: 1.0000 | INHALATION_SPRAY | Freq: Every day | RESPIRATORY_TRACT | 0 refills | Status: DC

## 2018-06-25 MED ORDER — PROCHLORPERAZINE MALEATE 10 MG PO TABS
10.0000 mg | ORAL_TABLET | Freq: Once | ORAL | Status: AC
  Administered 2018-06-25: 10 mg via ORAL

## 2018-06-25 MED ORDER — BORTEZOMIB CHEMO SQ INJECTION 3.5 MG (2.5MG/ML)
1.3000 mg/m2 | Freq: Once | INTRAMUSCULAR | Status: AC
  Administered 2018-06-25: 1.75 mg via SUBCUTANEOUS
  Filled 2018-06-25: qty 0.7

## 2018-06-25 MED ORDER — PROCHLORPERAZINE MALEATE 10 MG PO TABS
ORAL_TABLET | ORAL | Status: AC
  Filled 2018-06-25: qty 1

## 2018-06-25 NOTE — Patient Instructions (Signed)

## 2018-06-25 NOTE — Progress Notes (Signed)
Subjective:    Patient ID: [name] [name], male    DOB: 03-10-1961, 57 y.o.   MRN: [id number]  Synopsis: Referred in 2018 for evaluation of pulmonary hypertension in the setting of chronic left-sided hydropneumothorax and bronchiectasis.  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  Apparently this was originally treated as an empyema with VATS decortication in 2016.  CT imaging since then confirms bronchiectasis in the right lung and pulmonary function testing shows severe airflow obstruction.  In 2017 01/2017 he has had evidence of exertional hypoxemia.  Compliance with medications and oxygen use has been a challenge.  HPI Chief Complaint  Patient presents with  . Follow-up    both feet tingling, shortness of breath    He says that he is feeling better.  He still has some shortness of breath when he walks on level ground for extended periods of time.  He has chest congestion on a day-to-day basis but he does not cough up much mucus.  He continues to take Anoro daily.  He says that he uses his albuterol 2-3 times a day and its very helpful when he uses it.  No recent episodes of pneumonia or bronchitis.  He would like a flu shot today.  Past Medical History:  Diagnosis Date  . Acute on chronic respiratory failure ([place]) 02/07/2018  . Hypertension   . Kidney stones   . Pulmonary hypertension ([place])        Review of Systems  Constitutional: Negative for fever and unexpected weight change.  HENT: Negative for congestion, dental problem, ear pain, nosebleeds, postnasal drip, rhinorrhea, sinus pressure, sneezing, sore throat and trouble swallowing.   Eyes: Negative for redness and itching.  Respiratory: Positive for cough, chest tightness and shortness of breath. Negative for wheezing.   Cardiovascular: Negative for palpitations and leg swelling.  Gastrointestinal: Negative for nausea and vomiting.  Genitourinary: Negative for dysuria.  Musculoskeletal: Negative for joint  swelling.  Skin: Negative for rash.  Neurological: Negative for headaches.  Hematological: Does not bruise/bleed easily.  Psychiatric/Behavioral: Negative for dysphoric mood. The patient is not nervous/anxious.        Objective:   Physical Exam  Vitals:   06/25/18 1047  BP: 112/70  Pulse: 100  SpO2: 95%  Weight: 88 lb 3.2 oz (40 kg)  Height: 4' 11.84" (1.52 m)  RA  Ambulated 500 feet on room air and O2 saturation remained at 95%  Gen: chronically ill appearing HENT: OP clear, TM's clear, neck supple PULM: Crackles R base no wheezing, normal percussion CV: RRR, no mgr, trace edema GI: BS+, soft, nontender Derm: no cyanosis or rash Psyche: normal mood and affect   RHC: 06/2017 RA = 3, RV = 59/6   PA = 62/23 (40) PCW = 6 Fick cardiac output/index = 4.2/2.9 PVR = 8.1 WU Ao sat = 93%  PA sat = 67%, 68%  Echo: August 2018 LVEF 60-65%, D shaped interventricular septum suggestive of RV pressure volume overload, moderately dilated right ventricle with moderately depressed systolic function, PA pressure estimate 59 mmHg, RA dilation, valvues OK, moderate Tricuspid regurgitation  Chest imaging: March 2019 CT chest shows a chronic appearing hydropneumothorax on the left, significant bronchiectasis in the right lower lobe August 2018 chest x-ray images independently reviewed the prominent pulmonary vasculature, scar versus consolidation right lung, chronic appearing left-sided pleural effusion with apical pneumothorax4 January 2018 CT angiogram chest images independently reviewed showing chronic appearing hydropneumothorax in the left lung, scattered consolidation patches in the  right upper lobe and right lower lobe, there is also bronchiectasis changes in the right lower lobe.   Pulmonary function test: April 2017 ratio 45%, FEV1 0.56 L 21% predicted, FVC 1.25 L 37% predicted, total lung capacity 2.88 L 58% predicted, DLCO 10.30 54% predicted  Micro: 11/2016 Sputum: normal  flora 05/2018 Sputum AFB negative 05/2018 Sputum fungus: Aspergillus fumigatus 05/2018 Sputum culture: no growth  Labs: 04/2016 Alpha 1 MZ, normal level; total IgG level severely elevated, IgA normal, IgM low, IgE 2,066  CBC    Component Value Date/Time   WBC 16.7 (H) 06/18/2018 1313   RBC 4.55 06/18/2018 1313   HGB 10.7 (L) 06/18/2018 1313   HGB 10.7 (L) 04/07/2018 1429   HGB 9.5 (L) 03/05/2018 1206   HGB 13.1 11/07/2017 1014   HCT 33.5 (L) 06/18/2018 1313   HCT 32.3 (L) 03/05/2018 1206   HCT 39.7 11/07/2017 1014   PLT 519 (H) 06/18/2018 1313   PLT 637 (H) 04/07/2018 1429   PLT 474 (H) 03/05/2018 1206   MCV 73.6 (L) 06/18/2018 1313   MCV 82 03/05/2018 1206   MCV 87.6 11/07/2017 1014   MCH 23.5 (L) 06/18/2018 1313   MCHC 31.9 (L) 06/18/2018 1313   RDW 19.0 (H) 06/18/2018 1313   RDW 20.5 (H) 03/05/2018 1206   RDW 14.5 11/07/2017 1014   LYMPHSABS 0.9 06/18/2018 1313   LYMPHSABS 1.5 03/05/2018 1206   LYMPHSABS 1.7 11/07/2017 1014   MONOABS 1.0 (H) 06/18/2018 1313   MONOABS 0.8 11/07/2017 1014   EOSABS 0.0 06/18/2018 1313   EOSABS 0.4 03/05/2018 1206   BASOSABS 0.0 06/18/2018 1313   BASOSABS 0.1 03/05/2018 1206   BASOSABS 0.0 11/07/2017 1014   Hospital records from 02/2018 reviewed where he was treated for pneumonia, Oncology records reviewed where he was seen for multiple myeloma     Assessment & Plan:    Bronchiectasis without complication ([place]) - Plan: IgE, Aspergillus IgE Panel  COPD GOLD IV criteria but never smoked   Pulmonary HTN (HCC)  Multiple myeloma, remission status unspecified ([place])  Chronic respiratory failure with hypoxia ([place])   Discussion: This has been a stable interval for him since we stopped the tadalafil.  I do not think that it has made any difference in terms of his breathing.  He does not appear volume overloaded today.  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  In the past he has had a  very high IgE level.  Typically I would anticipate someone with ABPA to have more mucus production so I doubt that diagnosis on clinical grounds, though I wonder whether or not this could contribute to his disease burden.  We will check a serum IgE and Aspergillus specific IgE antibodies today.  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  acyclovir (ZOVIRAX) 400 MG tablet, Take 400 mg by mouth daily., Disp: , Rfl: 3 .  albuterol (PROVENTIL HFA;VENTOLIN HFA) 108 (90 Base) MCG/ACT inhaler, Inhale 2 puffs into the lungs every 6 (six) hours as needed for wheezing or shortness of breath., Disp: 1 Inhaler, Rfl: 5 .  docusate sodium (COLACE) 50 MG capsule, Take 1 capsule (50 mg total) by mouth daily., Disp: 30 capsule, Rfl: 0 .  HYDROmorphone (DILAUDID) 2 MG tablet, Take 1 tablet (2 mg total) by mouth every 4 (four) hours as needed for severe pain., Disp: 50 tablet, Rfl: 0 .  nortriptyline (PAMELOR) 25 MG capsule, Take 25 mg by mouth., Disp: , Rfl: 3 .  omeprazole (PRILOSEC) 40 MG capsule, Take 1 capsule (40 mg total) by mouth daily., Disp: 30 capsule, Rfl: 5 .  OXYGEN,  2lpm BEDTIME ONLY, Disp: , Rfl:  .  umeclidinium-vilanterol (ANORO ELLIPTA) 62.5-25 MCG/INH AEPB, Inhale 1 puff into the lungs daily., Disp: 60 each, Rfl: 5 .  Fluticasone-Umeclidin-Vilant (TRELEGY ELLIPTA) 100-62.5-25 MCG/INH AEPB, Inhale 1 puff into the lungs daily., Disp: 1 each, Rfl: 0 .  nortriptyline (PAMELOR) 25 MG capsule, Take 1 capsule (25 mg total) by mouth at bedtime., Disp: 30 capsule, Rfl: 3 .  tadalafil, PAH, (ADCIRCA) 20 MG tablet, Take 2 tablets (40 mg total) by mouth daily. (Patient not taking: Reported on 05/08/2018), Disp: 60 tablet, Rfl: 11

## 2018-06-25 NOTE — Patient Instructions (Signed)
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

## 2018-06-25 NOTE — Addendum Note (Signed)
Addended by: Dolores Lory on: 06/25/2018 11:20 AM   Modules accepted: Orders

## 2018-06-26 LAB — IGE: IGE (IMMUNOGLOBULIN E), SERUM: 1185 kU/L — AB (ref <=114)

## 2018-06-30 LAB — ASPERGILLUS IGE PANEL
A. AMSTEL/GLAUCU CLASS INTERP: 0
A. FLAVUS CLASS INTERP: 0
A. FUMIGATUS CLASS INTERP: 3
A. Nidulans Class Interp: 0
A. TERREUS CLASS INTERP: 0
A. Versicolor Class Interp: 0
Aspergillus amstel/glaucu IgE*: <0.35 kU/L (ref <0.35)
Aspergillus fumigatus IgE: 8.02 kU/L — ABNORMAL HIGH (ref <0.35)
Aspergillus niger IgE: 0.31 kU/L (ref <0.35)
Aspergillus versicolor IgE: <0.1 kU/L (ref <0.35)

## 2018-07-01 ENCOUNTER — Telehealth: Payer: Self-pay | Admitting: Pulmonary Disease

## 2018-07-01 NOTE — Telephone Encounter (Signed)
Spoke with [employer]. She stated that the patient has not been able to afford the Trelegy RX. Patient was on Anoro and was getting this free through the health department. Advised her to call the health department to advise on the medication change. Also advised her that I will 2 samples of Trelegy up front. She verbalized understanding.   Nothing further needed at time of call.

## 2018-07-02 ENCOUNTER — Inpatient Hospital Stay: Payer: Self-pay

## 2018-07-02 VITALS — BP 128/81 | HR 104 | Temp 98.5°F | Resp 17

## 2018-07-02 DIAGNOSIS — C9000 Multiple myeloma not having achieved remission: Secondary | ICD-10-CM

## 2018-07-02 LAB — CBC WITH DIFFERENTIAL/PLATELET
BASOS ABS: 0 10*3/uL (ref 0.0–0.1)
Basophils Relative: 0 %
Eosinophils Absolute: 0.1 10*3/uL (ref 0.0–0.5)
Eosinophils Relative: 1 %
HCT: 33.1 % — ABNORMAL LOW (ref 38.4–49.9)
Hemoglobin: 10.6 g/dL — ABNORMAL LOW (ref 13.0–17.1)
LYMPHS ABS: 1.9 10*3/uL (ref 0.9–3.3)
LYMPHS PCT: 17 %
MCH: 24.1 pg — AB (ref 27.2–33.4)
MCHC: 32 g/dL (ref 32.0–36.0)
MCV: 75.4 fL — ABNORMAL LOW (ref 79.3–98.0)
MONO ABS: 1.1 10*3/uL — AB (ref 0.1–0.9)
Monocytes Relative: 10 %
NEUTROS ABS: 7.8 10*3/uL — AB (ref 1.5–6.5)
Neutrophils Relative %: 72 %
Platelets: 397 10*3/uL (ref 140–400)
RBC: 4.39 MIL/uL (ref 4.20–5.82)
RDW: 19.8 % — AB (ref 11.0–14.6)
WBC: 10.9 10*3/uL — ABNORMAL HIGH (ref 4.0–10.3)

## 2018-07-02 LAB — CMP (CANCER CENTER ONLY)
ALT: 12 U/L (ref 0–44)
AST: 17 U/L (ref 15–41)
Albumin: 3.1 g/dL — ABNORMAL LOW (ref 3.5–5.0)
Alkaline Phosphatase: 86 U/L (ref 38–126)
Anion gap: 9 (ref 5–15)
BUN: 17 mg/dL (ref 6–20)
CO2: 27 mmol/L (ref 22–32)
Calcium: 9.8 mg/dL (ref 8.9–10.3)
Chloride: 102 mmol/L (ref 98–111)
Creatinine: 0.86 mg/dL (ref 0.61–1.24)
GFR, Est AFR Am: >60 mL/min (ref >60)
GFR, Estimated: >60 mL/min (ref >60)
Glucose, Bld: 152 mg/dL — ABNORMAL HIGH (ref 70–99)
POTASSIUM: 4.1 mmol/L (ref 3.5–5.1)
Sodium: 138 mmol/L (ref 135–145)
Total Bilirubin: 0.2 mg/dL — ABNORMAL LOW (ref 0.3–1.2)
Total Protein: 7.7 g/dL (ref 6.5–8.1)

## 2018-07-02 MED ORDER — BORTEZOMIB CHEMO SQ INJECTION 3.5 MG (2.5MG/ML)
1.3000 mg/m2 | Freq: Once | INTRAMUSCULAR | Status: AC
  Administered 2018-07-02: 1.75 mg via SUBCUTANEOUS
  Filled 2018-07-02: qty 0.7

## 2018-07-02 MED ORDER — PROCHLORPERAZINE MALEATE 10 MG PO TABS
10.0000 mg | ORAL_TABLET | Freq: Once | ORAL | Status: AC
  Administered 2018-07-02: 10 mg via ORAL

## 2018-07-02 MED ORDER — PROCHLORPERAZINE MALEATE 10 MG PO TABS
ORAL_TABLET | ORAL | Status: AC
  Filled 2018-07-02: qty 1

## 2018-07-02 NOTE — Patient Instructions (Signed)
[facility] Discharge Instructions for Patients Receiving Chemotherapy  Today you received the following chemotherapy agents velcade  To help prevent nausea and vomiting after your treatment, we encourage you to take your nausea medication as directed.   If you develop nausea and vomiting that is not controlled by your nausea medication, call the clinic.   BELOW ARE SYMPTOMS THAT SHOULD BE REPORTED IMMEDIATELY:  *FEVER GREATER THAN 100.5 F  *CHILLS WITH OR WITHOUT FEVER  NAUSEA AND VOMITING THAT IS NOT CONTROLLED WITH YOUR NAUSEA MEDICATION  *UNUSUAL SHORTNESS OF BREATH  *UNUSUAL BRUISING OR BLEEDING  TENDERNESS IN MOUTH AND THROAT WITH OR WITHOUT PRESENCE OF ULCERS  *URINARY PROBLEMS  *BOWEL PROBLEMS  UNUSUAL RASH Items with * indicate a potential emergency and should be followed up as soon as possible.  Feel free to call the clinic should you have any questions or concerns. The clinic phone number is [phone].  Please show the CHEMO ALERT CARD at check-in to the Emergency Department and triage nurse.   

## 2018-07-03 ENCOUNTER — Telehealth: Payer: Self-pay | Admitting: Pulmonary Disease

## 2018-07-03 LAB — FUNGUS CULTURE W SMEAR
MICRO NUMBER:: 90822720
SMEAR: NONE SEEN
SPECIMEN QUALITY: ADEQUATE

## 2018-07-03 LAB — RESPIRATORY CULTURE OR RESPIRATORY AND SPUTUM CULTURE
MICRO NUMBER: 90822722
RESULT:: NORMAL
SPECIMEN QUALITY: ADEQUATE

## 2018-07-03 LAB — MYCOBACTERIA,CULT W/FLUOROCHROME SMEAR
MICRO NUMBER: 90822721
SMEAR: NONE SEEN
SPECIMEN QUALITY: ADEQUATE

## 2018-07-03 LAB — KAPPA/LAMBDA LIGHT CHAINS
Kappa free light chain: 43.8 mg/L — ABNORMAL HIGH (ref 3.3–19.4)
Kappa, lambda light chain ratio: 3.74 — ABNORMAL HIGH (ref 0.26–1.65)
Lambda free light chains: 11.7 mg/L (ref 5.7–26.3)

## 2018-07-03 NOTE — Telephone Encounter (Signed)
Notes recorded by Juanito Doom, MD on 07/01/2018 at 3:31 PM EDT BJ, Please let the patient know this showed that he has an allergy to a mold, he needs to discuss with [place] more on the next visit. Thanks, B  Left message for patient to call back. Per patient's DPR, we have permission to speak with Oswaldo Milian who is the patient's caregiver.

## 2018-07-04 LAB — MULTIPLE MYELOMA PANEL, SERUM
ALBUMIN SERPL ELPH-MCNC: 3.1 g/dL (ref 2.9–4.4)
ALBUMIN/GLOB SERPL: 0.8 (ref 0.7–1.7)
ALPHA2 GLOB SERPL ELPH-MCNC: 0.8 g/dL (ref 0.4–1.0)
Alpha 1: 0.3 g/dL (ref 0.0–0.4)
B-Globulin SerPl Elph-Mcnc: 1 g/dL (ref 0.7–1.3)
GAMMA GLOB SERPL ELPH-MCNC: 2 g/dL — AB (ref 0.4–1.8)
GLOBULIN, TOTAL: 4.1 g/dL — AB (ref 2.2–3.9)
IGA: 64 mg/dL — AB (ref 90–386)
IGG (IMMUNOGLOBIN G), SERUM: 1980 mg/dL — AB (ref 700–1600)
IgM (Immunoglobulin M), Srm: 28 mg/dL (ref 20–172)
M Protein SerPl Elph-Mcnc: 1.6 g/dL — ABNORMAL HIGH
Total Protein ELP: 7.2 g/dL (ref 6.0–8.5)

## 2018-07-07 ENCOUNTER — Other Ambulatory Visit: Payer: Self-pay | Admitting: Pulmonary Disease

## 2018-07-07 MED ORDER — FLUTICASONE-UMECLIDIN-VILANT 100-62.5-25 MCG/INH IN AEPB: 1.0000 | INHALATION_SPRAY | Freq: Every day | RESPIRATORY_TRACT | 5 refills | Status: DC

## 2018-07-07 MED ORDER — ALBUTEROL SULFATE HFA 108 (90 BASE) MCG/ACT IN AERS: 2.0000 | INHALATION_SPRAY | Freq: Four times a day (QID) | RESPIRATORY_TRACT | 5 refills | Status: DC | PRN

## 2018-07-07 NOTE — Telephone Encounter (Signed)
lmtcb x2 for both pt and pt's nephew Edison Simon (DPR).

## 2018-07-08 NOTE — Telephone Encounter (Signed)
ATC pt's nephew x3 and received busy dial. Will call back

## 2018-07-09 ENCOUNTER — Telehealth: Payer: Self-pay

## 2018-07-09 ENCOUNTER — Inpatient Hospital Stay: Payer: Medicare Other

## 2018-07-09 ENCOUNTER — Inpatient Hospital Stay (HOSPITAL_BASED_OUTPATIENT_CLINIC_OR_DEPARTMENT_OTHER): Payer: Medicare Other | Admitting: Hematology

## 2018-07-09 ENCOUNTER — Other Ambulatory Visit: Payer: Self-pay

## 2018-07-09 ENCOUNTER — Inpatient Hospital Stay: Payer: Medicare Other | Attending: Hematology

## 2018-07-09 ENCOUNTER — Encounter: Payer: Self-pay | Admitting: Hematology

## 2018-07-09 VITALS — BP 120/80 | HR 97 | Temp 98.1°F | Resp 18 | Ht 59.88 in | Wt 88.9 lb

## 2018-07-09 DIAGNOSIS — C9000 Multiple myeloma not having achieved remission: Secondary | ICD-10-CM

## 2018-07-09 DIAGNOSIS — Z79899 Other long term (current) drug therapy: Secondary | ICD-10-CM | POA: Diagnosis not present

## 2018-07-09 DIAGNOSIS — Z5112 Encounter for antineoplastic immunotherapy: Secondary | ICD-10-CM | POA: Diagnosis not present

## 2018-07-09 LAB — CBC WITH DIFFERENTIAL/PLATELET
BASOS ABS: 0.1 10*3/uL (ref 0.0–0.1)
Basophils Relative: 1 %
EOS PCT: 2 %
Eosinophils Absolute: 0.2 10*3/uL (ref 0.0–0.5)
HEMATOCRIT: 35 % — AB (ref 38.4–49.9)
Hemoglobin: 11.1 g/dL — ABNORMAL LOW (ref 13.0–17.1)
LYMPHS ABS: 2 10*3/uL (ref 0.9–3.3)
LYMPHS PCT: 25 %
MCH: 24.5 pg — AB (ref 27.2–33.4)
MCHC: 31.9 g/dL — ABNORMAL LOW (ref 32.0–36.0)
MCV: 76.7 fL — AB (ref 79.3–98.0)
MONO ABS: 1.4 10*3/uL — AB (ref 0.1–0.9)
Monocytes Relative: 17 %
NEUTROS ABS: 4.5 10*3/uL (ref 1.5–6.5)
Neutrophils Relative %: 55 %
Platelets: 411 10*3/uL — ABNORMAL HIGH (ref 140–400)
RBC: 4.55 MIL/uL (ref 4.20–5.82)
RDW: 21.4 % — ABNORMAL HIGH (ref 11.0–14.6)
WBC: 8.1 10*3/uL (ref 4.0–10.3)

## 2018-07-09 LAB — CMP (CANCER CENTER ONLY)
ALT: 17 U/L (ref 0–44)
AST: 22 U/L (ref 15–41)
Albumin: 2.9 g/dL — ABNORMAL LOW (ref 3.5–5.0)
Alkaline Phosphatase: 87 U/L (ref 38–126)
Anion gap: 7 (ref 5–15)
BILIRUBIN TOTAL: 0.2 mg/dL — AB (ref 0.3–1.2)
BUN: 21 mg/dL — AB (ref 6–20)
CALCIUM: 8.6 mg/dL — AB (ref 8.9–10.3)
CO2: 30 mmol/L (ref 22–32)
CREATININE: 0.82 mg/dL (ref 0.61–1.24)
Chloride: 101 mmol/L (ref 98–111)
Glucose, Bld: 89 mg/dL (ref 70–99)
Potassium: 3.6 mmol/L (ref 3.5–5.1)
Sodium: 138 mmol/L (ref 135–145)
TOTAL PROTEIN: 7.3 g/dL (ref 6.5–8.1)

## 2018-07-09 MED ORDER — PROCHLORPERAZINE MALEATE 10 MG PO TABS
10.0000 mg | ORAL_TABLET | Freq: Once | ORAL | Status: AC
  Administered 2018-07-09: 10 mg via ORAL

## 2018-07-09 MED ORDER — LENALIDOMIDE 15 MG PO CAPS: 15.0000 mg | ORAL_CAPSULE | Freq: Every day | ORAL | 2 refills | Status: DC

## 2018-07-09 MED ORDER — PROCHLORPERAZINE MALEATE 10 MG PO TABS
ORAL_TABLET | ORAL | Status: AC
  Filled 2018-07-09: qty 1

## 2018-07-09 MED ORDER — BORTEZOMIB CHEMO SQ INJECTION 3.5 MG (2.5MG/ML)
1.3000 mg/m2 | Freq: Once | INTRAMUSCULAR | Status: AC
  Administered 2018-07-09: 1.75 mg via SUBCUTANEOUS
  Filled 2018-07-09: qty 0.7

## 2018-07-09 NOTE — Progress Notes (Signed)
HEMATOLOGY/ONCOLOGY CONSULTATION NOTE  Date of Service: 07/09/2018  Patient Care Team: [name], PA-C as PCP - General (Physician Assistant) Michel Bickers, MD as Consulting Physician (Infectious Diseases) Brand Males, MD as Consulting Physician (Pulmonary Disease)  CHIEF COMPLAINTS/PURPOSE OF CONSULTATION:  Multiple myeloma  Oncologic History:  57 y.o.  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  Based on the diagnosis, patient was started on treatment with bortezomib & low-dose dexamethasone.  Initially, lenalidomide was omitted out of concern for high risk of for infectious complications.  We have continued to therapy with just a doublet treatment and did not reintensify the therapy.  Treatment course has been complicated by exacerbation of pulmonary hypertension and now with shingles reactivation resulting in blistering rash and skin ulcerations on the left flank.  Infection appears to be responding to higher dose of acyclovir and skin is healing.   HISTORY OF PRESENTING ILLNESS:   [name] [name] is a wonderful 57 y.o. male who has been referred to [place] by my colleague Dr [name] for evaluation and management of Multiple myeloma. He is accompanied today by a Optometrist. The pt reports that he is doing well overall.   The pt has been followed by my colleague Dr [name] and has been treated with Velcade and dexamethasone.    The pt reports that he has lost some weight and hasn't eaten as much because he hasn't been able to taste his food as well.   He notes that his shingles rash has continued to clear up and he denies pain associated with his remaining rash. His back pain continues and he has run out of his Hydromorphone. He notes that his back  pain has not gotten better since he was first evaluated. He notes that he feels tired and has a little SOB.   Most recent lab results (04/07/18) of CBC and CMP is as follows: all values are WNL except for Sodium at 134, chloride at 96, Total Protein at 8.7, Albumin at 3.0, Total bilirubin <0.2, WBC at 19.1k, HGB at 10.7, HCT at 33.2, MCV at 73.5, MCH at 23.7, RDW at 18.4, Platelets at 637k, ANC at 17.1k.Marland Kitchen MMP 04/07/18 showed IgG elevated at 2301 and M Protein at 1.5. Ferritin 04/07/18 was elevated at 436 Iron/TIBC 04/07/18 showed at 12% saturation ratio LDH 04/07/18 is WNL at 171 Kappa/Lambda 04/07/18 showed Kappa at 25.2 and K:L ratio at 2.27  On review of systems, pt reports some SOB, back pain, resolving shingles rash, mouth sores, weight loss, and denies fevers, chills, new skin rashes, and any other symptoms.  Interval History:   [name] [name] returns today for management and evaluation of his multiple myeloma. The patient's last visit with [place] was on 06/11/18. He is accompanied today by a Optometrist. The pt reports that he is doing well overall.   The pt reports that he is doing better overall and is breathing a little bit better. The pt notes that he is not needing to use pain medications for his back any more as his back pain has resolved.   The pt notes that he is having some numbness and tingling in his toes, which is not painful.   The pt saw his pulmonologist Dr. Mertha Finders in the interim.   Lab results today (07/09/18) of  CBC w/diff, CMP is as follows: all values are WNL except for HGB at 11.1, HCT at 35.0, MCV at 76.7, MCH at 24.5, MCHC at 31.9, RDW at 21.4, PLT at 411k, Monocytes abs at 1.4k, BUN at 21, Calcium at 8.6, Albumin at 2.9, Total Bilirubin at 0.2. 07/02/18 MMP revealed all values WNL except for IgG at 1980, IgA at 64, Gamma glob at 2.0, M Protein at 1.6, Globulin total at 4.1  On review of systems, pt reports breathing better, eating well, good energy levels, stable numbness in his  toes, and denies back pain, tooth pain, pain along the spine, neuropathy in his hands, and any other symptoms.   MEDICAL HISTORY:  Past Medical History:  Diagnosis Date  . Acute on chronic respiratory failure ([place]) 02/07/2018  . Hypertension   . Kidney stones   . Pulmonary hypertension ([place])     SURGICAL HISTORY: Past Surgical History:  Procedure Laterality Date  . CARDIAC CATHETERIZATION N/A 01/18/2016   Procedure: Right Heart Cath;  Surgeon: Jolaine Artist, MD;  Location: [facility];  Service: Cardiovascular;  Laterality: N/A;  . CARDIAC CATHETERIZATION N/A 06/24/2016   Procedure: Right Heart Cath;  Surgeon: Jolaine Artist, MD;  Location: [facility];  Service: Cardiovascular;  Laterality: N/A;  . IR RADIOLOGIST EVAL & MGMT  05/27/2018  . KIDNEY SURGERY    . VIDEO ASSISTED THORACOSCOPY (VATS)/DECORTICATION Left 2003  . VIDEO ASSISTED THORACOSCOPY (VATS)/DECORTICATION Left 10/02/2015   Procedure: VIDEO ASSISTED THORACOSCOPY (VATS)/DECORTICATION and drainage of chronic empyena;  Surgeon: [name], MD;  Location: [facility];  Service: Thoracic;  Laterality: Left;  Marland Kitchen VIDEO BRONCHOSCOPY N/A 10/02/2015   Procedure: VIDEO BRONCHOSCOPY;  Surgeon: [name], MD;  Location: [facility];  Service: Thoracic;  Laterality: N/A;    SOCIAL HISTORY: Social History   Socioeconomic History  . Marital status: Single    Spouse name: Not on file  . Number of children: Not on file  . Years of education: Not on file  . Highest education level: Not on file  Occupational History  . Not on file  Social Needs  . Financial resource strain: Not on file  . Food insecurity:    Worry: Not on file    Inability: Not on file  . Transportation needs:    Medical: Not on file    Non-medical: Not on file  Tobacco Use  . Smoking status: Never Smoker  . Smokeless tobacco: Never Used  Substance and Sexual Activity  . Alcohol use: Yes  . Drug use: No  . Sexual activity: Not on file    Lifestyle  . Physical activity:    Days per week: Not on file    Minutes per session: Not on file  . Stress: Not on file  Relationships  . Social connections:    Talks on phone: Not on file    Gets together: Not on file    Attends religious service: Not on file    Active member of club or organization: Not on file    Attends meetings of clubs or organizations: Not on file    Relationship status: Not on file  . Intimate partner violence:    Fear of current or ex partner: Not on file    Emotionally abused: Not on file    Physically abused: Not on file    Forced sexual activity: Not on file  Other Topics Concern  . Not on file  Social History Narrative  .  Not on file    FAMILY HISTORY: Family History  Problem Relation Age of Onset  . Hypertension Other     ALLERGIES:  has No Known Allergies.  MEDICATIONS:  Current Outpatient Medications  Medication Sig Dispense Refill  . acyclovir (ZOVIRAX) 400 MG tablet Take 400 mg by mouth daily.  3  . albuterol (PROVENTIL HFA;VENTOLIN HFA) 108 (90 Base) MCG/ACT inhaler Inhale 2 puffs into the lungs every 6 (six) hours as needed for wheezing or shortness of breath. 1 Inhaler 5  . docusate sodium (COLACE) 50 MG capsule Take 1 capsule (50 mg total) by mouth daily. 30 capsule 0  . Fluticasone-Umeclidin-Vilant (TRELEGY ELLIPTA) 100-62.5-25 MCG/INH AEPB Inhale 1 puff into the lungs daily. 1 each 5  . HYDROmorphone (DILAUDID) 2 MG tablet Take 1 tablet (2 mg total) by mouth every 4 (four) hours as needed for severe pain. 50 tablet 0  . nortriptyline (PAMELOR) 25 MG capsule Take 25 mg by mouth.  3  . omeprazole (PRILOSEC) 40 MG capsule Take 1 capsule (40 mg total) by mouth daily. 30 capsule 5  . OXYGEN 2lpm BEDTIME ONLY    . umeclidinium-vilanterol (ANORO ELLIPTA) 62.5-25 MCG/INH AEPB Inhale 1 puff into the lungs daily. 60 each 5  . lenalidomide (REVLIMID) 15 MG capsule Take 1 capsule (15 mg total) by mouth daily. 21 capsule 2  . nortriptyline  (PAMELOR) 25 MG capsule Take 1 capsule (25 mg total) by mouth at bedtime. 30 capsule 3   No current facility-administered medications for this visit.    Facility-Administered Medications Ordered in Other Visits  Medication Dose Route Frequency Provider Last Rate Last Dose  . bortezomib SQ (VELCADE) chemo injection 1.75 mg  1.3 mg/m2 (Treatment Plan Recorded) Subcutaneous Once Brunetta Genera, MD      . prochlorperazine (COMPAZINE) tablet 10 mg  10 mg Oral Once Brunetta Genera, MD        REVIEW OF SYSTEMS:    A 10+ POINT REVIEW OF SYSTEMS WAS OBTAINED including neurology, dermatology, psychiatry, cardiac, respiratory, lymph, extremities, GI, GU, Musculoskeletal, constitutional, breasts, reproductive, HEENT.  All pertinent positives are noted in the HPI.  All others are negative.   PHYSICAL EXAMINATION: ECOG PERFORMANCE STATUS: 2 - Symptomatic, <50% confined to bed  Vitals:   07/09/18 1435  BP: 120/80  Pulse: 97  Resp: 18  Temp: 98.1 F (36.7 C)  SpO2: 98%   Filed Weights   07/09/18 1435  Weight: 88 lb 14.4 oz (40.3 kg)   .Body mass index is 17.43 kg/m.  GENERAL:alert, in no acute distress and comfortable SKIN: no acute rashes, no significant lesions EYES: conjunctiva are pink and non-injected, sclera anicteric OROPHARYNX: MMM, no exudates, no oropharyngeal erythema or ulceration NECK: supple, no JVD LYMPH:  no palpable lymphadenopathy in the cervical, axillary or inguinal regions LUNGS: clear to auscultation b/l with normal respiratory effort HEART: regular rate & rhythm ABDOMEN:  normoactive bowel sounds , non tender, not distended. No palpable hepatosplenomegaly.  Extremity: no pedal edema PSYCH: alert & oriented x 3 with fluent speech NEURO: no focal motor/sensory deficits   LABORATORY DATA:  I have reviewed the data as listed  . CBC Latest Ref Rng & Units 07/09/2018 07/02/2018 06/25/2018  WBC 4.0 - 10.3 K/uL 8.1 10.9(H) 11.5(H)  Hemoglobin 13.0 - 17.1 g/dL  11.1(L) 10.6(L) 11.3(L)  Hematocrit 38.4 - 49.9 % 35.0(L) 33.1(L) 36.1(L)  Platelets 140 - 400 K/uL 411(H) 397 446(H)   . CBC    Component Value Date/Time   WBC  8.1 07/09/2018 1333   RBC 4.55 07/09/2018 1333   HGB 11.1 (L) 07/09/2018 1333   HGB 10.7 (L) 04/07/2018 1429   HGB 9.5 (L) 03/05/2018 1206   HGB 13.1 11/07/2017 1014   HCT 35.0 (L) 07/09/2018 1333   HCT 32.3 (L) 03/05/2018 1206   HCT 39.7 11/07/2017 1014   PLT 411 (H) 07/09/2018 1333   PLT 637 (H) 04/07/2018 1429   PLT 474 (H) 03/05/2018 1206   MCV 76.7 (L) 07/09/2018 1333   MCV 82 03/05/2018 1206   MCV 87.6 11/07/2017 1014   MCH 24.5 (L) 07/09/2018 1333   MCHC 31.9 (L) 07/09/2018 1333   RDW 21.4 (H) 07/09/2018 1333   RDW 20.5 (H) 03/05/2018 1206   RDW 14.5 11/07/2017 1014   LYMPHSABS 2.0 07/09/2018 1333   LYMPHSABS 1.5 03/05/2018 1206   LYMPHSABS 1.7 11/07/2017 1014   MONOABS 1.4 (H) 07/09/2018 1333   MONOABS 0.8 11/07/2017 1014   EOSABS 0.2 07/09/2018 1333   EOSABS 0.4 03/05/2018 1206   BASOSABS 0.1 07/09/2018 1333   BASOSABS 0.1 03/05/2018 1206   BASOSABS 0.0 11/07/2017 1014    . CMP Latest Ref Rng & Units 07/09/2018 07/02/2018 06/25/2018  Glucose 70 - 99 mg/dL 89 152(H) 104(H)  BUN 6 - 20 mg/dL 21(H) 17 16  Creatinine 0.61 - 1.24 mg/dL 0.82 0.86 0.84  Sodium 135 - 145 mmol/L 138 138 138  Potassium 3.5 - 5.1 mmol/L 3.6 4.1 3.9  Chloride 98 - 111 mmol/L 101 102 102  CO2 22 - 32 mmol/L _0 Calcium 8.9 - 10.3 mg/dL 8.6(L) 9.8 8.8(L)  Total Protein 6.5 - 8.1 g/dL 7.3 7.7 7.7  Total Bilirubin 0.3 - 1.2 mg/dL 0.2(L) 0.2(L) 0.3  Alkaline Phos 38 - 126 U/L 87 86 94  AST 15 - 41 U/L _1 ALT 0 - 44 U/L _2 03/18/18 Cytogenetics:  03/18/18 Pathology:  11/03/17 Cytogenetics:    RADIOGRAPHIC STUDIES: I have personally reviewed the radiological images as listed and agreed with the findings in the report. No results found.  ASSESSMENT & PLAN:   57 y.o. male with  1. Multiple  Myeloma multiple myeloma based on discovery of monoclonal gammopathy in the peripheral blood associated with a hypermetabolic lytic lesion in L4 and presence of 24% of plasma cell population in the bone marrow.  Based on the diagnosis, patient was started on treatment with bortezomib & low-dose dexamethasone.  Initially, lenalidomide was omitted out of concern for high risk of for infectious complications.  We have continued to therapy with just a doublet treatment and did not reintensify the therapy.  Treatment course has been complicated by exacerbation of pulmonary hypertension and now with shingles reactivation BM 03/18/18 shows limited response with still 18% plasma cell in the bone marrow. Cytogenetics shows  14.5% [id number]  PLAN:  -Grade I neuropathy, will continue to watch this and pt will let me know if this worsens -Discussed pt labwork today, 07/09/18; blood counts and chemistries are overall stable. Last MMP from 07/02/18 revealed M Protein at 1.6 -not needing pain medications for his back pain now. -Discussed that the patient's M Protein is no longer decreasing and has plateaued on Velcade/Dex -Discussed my recommendation to begin Revlimid -Pt has not had h/o blood clots, will begin 84m aspirin with Revlimid. Discussed role and possible adverse effects from Revlimid and consent obtained. -The pt has no prohibitive toxicities from continuing Velcade at this time.   -continue Acyclovir prophylaxis -  Will see pt back in 4 weeks    Continue Weekly Velcade with labs RTC with Dr Irene Limbo in 4 weeks   All of the patients questions were answered with apparent satisfaction. The patient knows to call the clinic with any problems, questions or concerns.  The total time spent in the appt was 25 minutes and more than 50% was on counseling and direct patient cares.     Sullivan Lone MD MS AAHIVMS [facility] Hematology/Oncology Physician [facility]  (Office):       [phone] (Work  cell):  [phone] (Fax):           [phone]  07/09/2018 3:17 PM  I, Baldwin Jamaica, am acting as a scribe for Dr. Irene Limbo  .I have reviewed the above documentation for accuracy and completeness, and I agree with the above. Brunetta Genera MD

## 2018-07-09 NOTE — Patient Instructions (Signed)
[facility] Discharge Instructions for Patients Receiving Chemotherapy  Today you received the following chemotherapy agents Velcade.  To help prevent nausea and vomiting after your treatment, we encourage you to take your nausea medication as directed.  If you develop nausea and vomiting that is not controlled by your nausea medication, call the clinic.   BELOW ARE SYMPTOMS THAT SHOULD BE REPORTED IMMEDIATELY:  *FEVER GREATER THAN 100.5 F  *CHILLS WITH OR WITHOUT FEVER  NAUSEA AND VOMITING THAT IS NOT CONTROLLED WITH YOUR NAUSEA MEDICATION  *UNUSUAL SHORTNESS OF BREATH  *UNUSUAL BRUISING OR BLEEDING  TENDERNESS IN MOUTH AND THROAT WITH OR WITHOUT PRESENCE OF ULCERS  *URINARY PROBLEMS  *BOWEL PROBLEMS  UNUSUAL RASH Items with * indicate a potential emergency and should be followed up as soon as possible.  Feel free to call the clinic should you have any questions or concerns. The clinic phone number is [phone].  Please show the CHEMO ALERT CARD at check-in to the Emergency Department and triage nurse.   

## 2018-07-09 NOTE — Telephone Encounter (Signed)
Prescription for revlimid faxed to [place] at [phone]. Confirmed fax receipt 07/09/18 at 1721.

## 2018-07-09 NOTE — Telephone Encounter (Signed)
Called and spoke to patient's caregiver. Relayed results per Dr. Lake Bells and she stated she will translate to patient. Nothing further needed at this time.

## 2018-07-09 NOTE — Patient Instructions (Signed)
[facility] Discharge Instructions for Patients Receiving Chemotherapy  Today you received the following chemotherapy agents Velcade.  To help prevent nausea and vomiting after your treatment, we encourage you to take your nausea medication as directed.  If you develop nausea and vomiting that is not controlled by your nausea medication, call the clinic.   BELOW ARE SYMPTOMS THAT SHOULD BE REPORTED IMMEDIATELY:  *FEVER GREATER THAN 100.5 F  *CHILLS WITH OR WITHOUT FEVER  NAUSEA AND VOMITING THAT IS NOT CONTROLLED WITH YOUR NAUSEA MEDICATION  *UNUSUAL SHORTNESS OF BREATH  *UNUSUAL BRUISING OR BLEEDING  TENDERNESS IN MOUTH AND THROAT WITH OR WITHOUT PRESENCE OF ULCERS  *URINARY PROBLEMS  *BOWEL PROBLEMS  UNUSUAL RASH Items with * indicate a potential emergency and should be followed up as soon as possible.  Feel free to call the clinic should you have any questions or concerns. The clinic phone number is [phone].  Please show the CHEMO ALERT CARD at check-in to the Emergency Department and triage nurse.   

## 2018-07-10 ENCOUNTER — Telehealth: Payer: Self-pay

## 2018-07-10 NOTE — Telephone Encounter (Signed)
Patient mailbox was full and was unable to leave a message. Will mail a letter with a calender enclosed. Per 9/5 los of upcoming appointment.

## 2018-07-16 ENCOUNTER — Inpatient Hospital Stay: Payer: Medicare Other

## 2018-07-16 ENCOUNTER — Other Ambulatory Visit: Payer: Self-pay | Admitting: Hematology

## 2018-07-16 VITALS — BP 103/72 | HR 91 | Temp 98.1°F | Resp 18

## 2018-07-16 DIAGNOSIS — C9000 Multiple myeloma not having achieved remission: Secondary | ICD-10-CM

## 2018-07-16 DIAGNOSIS — Z7189 Other specified counseling: Secondary | ICD-10-CM

## 2018-07-16 DIAGNOSIS — Z5112 Encounter for antineoplastic immunotherapy: Secondary | ICD-10-CM | POA: Diagnosis not present

## 2018-07-16 LAB — CBC WITH DIFFERENTIAL/PLATELET
Basophils Absolute: 0 10*3/uL (ref 0.0–0.1)
Basophils Relative: 0 %
EOS ABS: 0.4 10*3/uL (ref 0.0–0.5)
Eosinophils Relative: 5 %
HCT: 32.6 % — ABNORMAL LOW (ref 38.4–49.9)
Hemoglobin: 10.3 g/dL — ABNORMAL LOW (ref 13.0–17.1)
LYMPHS ABS: 1.1 10*3/uL (ref 0.9–3.3)
LYMPHS PCT: 13 %
MCH: 24.5 pg — AB (ref 27.2–33.4)
MCHC: 31.6 g/dL — AB (ref 32.0–36.0)
MCV: 77.4 fL — ABNORMAL LOW (ref 79.3–98.0)
MONOS PCT: 9 %
Monocytes Absolute: 0.8 10*3/uL (ref 0.1–0.9)
NEUTROS ABS: 6.2 10*3/uL (ref 1.5–6.5)
NEUTROS PCT: 73 %
Platelets: 413 10*3/uL — ABNORMAL HIGH (ref 140–400)
RBC: 4.21 MIL/uL (ref 4.20–5.82)
RDW: 19 % — AB (ref 11.0–14.6)
WBC: 8.5 10*3/uL (ref 4.0–10.3)

## 2018-07-16 LAB — CMP (CANCER CENTER ONLY)
ALT: 11 U/L (ref 0–44)
ANION GAP: 7 (ref 5–15)
AST: 17 U/L (ref 15–41)
Albumin: 2.8 g/dL — ABNORMAL LOW (ref 3.5–5.0)
Alkaline Phosphatase: 84 U/L (ref 38–126)
BUN: 11 mg/dL (ref 6–20)
CHLORIDE: 104 mmol/L (ref 98–111)
CO2: 28 mmol/L (ref 22–32)
CREATININE: 0.82 mg/dL (ref 0.61–1.24)
Calcium: 8.5 mg/dL — ABNORMAL LOW (ref 8.9–10.3)
GFR, Estimated: >60 mL/min (ref >60)
Glucose, Bld: 190 mg/dL — ABNORMAL HIGH (ref 70–99)
POTASSIUM: 3.9 mmol/L (ref 3.5–5.1)
Sodium: 139 mmol/L (ref 135–145)
Total Bilirubin: <0.2 mg/dL — ABNORMAL LOW (ref 0.3–1.2)
Total Protein: 7.3 g/dL (ref 6.5–8.1)

## 2018-07-16 MED ORDER — PROCHLORPERAZINE MALEATE 10 MG PO TABS
ORAL_TABLET | ORAL | Status: AC
  Filled 2018-07-16: qty 1

## 2018-07-16 MED ORDER — ZOLEDRONIC ACID 4 MG/5ML IV CONC
3.5000 mg | Freq: Once | INTRAVENOUS | Status: AC
  Administered 2018-07-16: 3.5 mg via INTRAVENOUS
  Filled 2018-07-16: qty 4.38

## 2018-07-16 MED ORDER — SODIUM CHLORIDE 0.9% FLUSH
20.0000 mL | Freq: Once | INTRAVENOUS | Status: DC
  Filled 2018-07-16: qty 20

## 2018-07-16 MED ORDER — PROCHLORPERAZINE MALEATE 10 MG PO TABS
10.0000 mg | ORAL_TABLET | Freq: Once | ORAL | Status: AC
  Administered 2018-07-16: 10 mg via ORAL

## 2018-07-16 MED ORDER — BORTEZOMIB CHEMO SQ INJECTION 3.5 MG (2.5MG/ML)
1.3000 mg/m2 | Freq: Once | INTRAMUSCULAR | Status: AC
  Administered 2018-07-16: 1.75 mg via SUBCUTANEOUS
  Filled 2018-07-16: qty 0.7

## 2018-07-16 MED ORDER — SODIUM CHLORIDE 0.9 % IV SOLN
INTRAVENOUS | Status: DC
  Administered 2018-07-16: 16:00:00 via INTRAVENOUS
  Filled 2018-07-16: qty 250

## 2018-07-16 NOTE — Patient Instructions (Addendum)
[facility] Discharge Instructions for Patients Receiving Chemotherapy  Today you received the following chemotherapy agents Velcade, Zometa.  To help prevent nausea and vomiting after your treatment, we encourage you to take your nausea medication as directed   If you develop nausea and vomiting that is not controlled by your nausea medication, call the clinic.   BELOW ARE SYMPTOMS THAT SHOULD BE REPORTED IMMEDIATELY:  *FEVER GREATER THAN 100.5 F  *CHILLS WITH OR WITHOUT FEVER  NAUSEA AND VOMITING THAT IS NOT CONTROLLED WITH YOUR NAUSEA MEDICATION  *UNUSUAL SHORTNESS OF BREATH  *UNUSUAL BRUISING OR BLEEDING  TENDERNESS IN MOUTH AND THROAT WITH OR WITHOUT PRESENCE OF ULCERS  *URINARY PROBLEMS  *BOWEL PROBLEMS  UNUSUAL RASH Items with * indicate a potential emergency and should be followed up as soon as possible.  Feel free to call the clinic should you have any questions or concerns. The clinic phone number is [phone].  Please show the CHEMO ALERT CARD at check-in to the Emergency Department and triage nurse.   

## 2018-07-17 MED FILL — DEXAMETHASONE 4 MG TABLET: 4 | 28 days supply | Qty: 20 | Fill #3

## 2018-07-20 ENCOUNTER — Other Ambulatory Visit: Payer: Self-pay | Admitting: Internal Medicine

## 2018-07-20 MED ORDER — ALBUTEROL SULFATE HFA 108 (90 BASE) MCG/ACT IN AERS: 2.0000 | INHALATION_SPRAY | Freq: Four times a day (QID) | RESPIRATORY_TRACT | 5 refills | Status: DC | PRN

## 2018-07-20 MED ORDER — FLUTICASONE-UMECLIDIN-VILANT 100-62.5-25 MCG/INH IN AEPB: 1.0000 | INHALATION_SPRAY | Freq: Every day | RESPIRATORY_TRACT | 5 refills | Status: DC

## 2018-07-23 ENCOUNTER — Other Ambulatory Visit: Payer: Self-pay | Admitting: Hematology and Oncology

## 2018-07-23 ENCOUNTER — Inpatient Hospital Stay: Payer: Medicare Other

## 2018-07-23 VITALS — BP 128/86 | HR 96 | Temp 97.8°F | Resp 18 | Ht 59.88 in | Wt 90.0 lb

## 2018-07-23 DIAGNOSIS — C9000 Multiple myeloma not having achieved remission: Secondary | ICD-10-CM

## 2018-07-23 DIAGNOSIS — Z5112 Encounter for antineoplastic immunotherapy: Secondary | ICD-10-CM | POA: Diagnosis not present

## 2018-07-23 DIAGNOSIS — B029 Zoster without complications: Secondary | ICD-10-CM

## 2018-07-23 LAB — CMP (CANCER CENTER ONLY)
ALK PHOS: 81 U/L (ref 38–126)
ALT: 11 U/L (ref 0–44)
AST: 19 U/L (ref 15–41)
Albumin: 3.1 g/dL — ABNORMAL LOW (ref 3.5–5.0)
Anion gap: 7 (ref 5–15)
BILIRUBIN TOTAL: 0.2 mg/dL — AB (ref 0.3–1.2)
BUN: 11 mg/dL (ref 6–20)
CALCIUM: 9.3 mg/dL (ref 8.9–10.3)
CO2: 31 mmol/L (ref 22–32)
CREATININE: 0.93 mg/dL (ref 0.61–1.24)
Chloride: 99 mmol/L (ref 98–111)
GFR, Estimated: >60 mL/min (ref >60)
GLUCOSE: 117 mg/dL — AB (ref 70–99)
Potassium: 3.9 mmol/L (ref 3.5–5.1)
SODIUM: 137 mmol/L (ref 135–145)
TOTAL PROTEIN: 7.3 g/dL (ref 6.5–8.1)

## 2018-07-23 LAB — CBC WITH DIFFERENTIAL/PLATELET
Basophils Absolute: 0 10*3/uL (ref 0.0–0.1)
Basophils Relative: 0 %
EOS ABS: 0.3 10*3/uL (ref 0.0–0.5)
Eosinophils Relative: 4 %
HEMATOCRIT: 35 % — AB (ref 38.4–49.9)
HEMOGLOBIN: 11.1 g/dL — AB (ref 13.0–17.1)
LYMPHS ABS: 1.4 10*3/uL (ref 0.9–3.3)
LYMPHS PCT: 19 %
MCH: 24.3 pg — AB (ref 27.2–33.4)
MCHC: 31.7 g/dL — ABNORMAL LOW (ref 32.0–36.0)
MCV: 76.7 fL — ABNORMAL LOW (ref 79.3–98.0)
MONOS PCT: 13 %
Monocytes Absolute: 1 10*3/uL — ABNORMAL HIGH (ref 0.1–0.9)
NEUTROS ABS: 4.6 10*3/uL (ref 1.5–6.5)
NEUTROS PCT: 64 %
Platelets: 289 10*3/uL (ref 140–400)
RBC: 4.56 MIL/uL (ref 4.20–5.82)
RDW: 19.9 % — ABNORMAL HIGH (ref 11.0–14.6)
WBC: 7.2 10*3/uL (ref 4.0–10.3)

## 2018-07-23 MED ORDER — PROCHLORPERAZINE MALEATE 10 MG PO TABS
ORAL_TABLET | ORAL | Status: AC
  Filled 2018-07-23: qty 1

## 2018-07-23 MED ORDER — BORTEZOMIB CHEMO SQ INJECTION 3.5 MG (2.5MG/ML)
1.3000 mg/m2 | Freq: Once | INTRAMUSCULAR | Status: AC
  Administered 2018-07-23: 1.75 mg via SUBCUTANEOUS
  Filled 2018-07-23: qty 0.7

## 2018-07-23 MED ORDER — PROCHLORPERAZINE MALEATE 10 MG PO TABS
10.0000 mg | ORAL_TABLET | Freq: Once | ORAL | Status: AC
  Administered 2018-07-23: 10 mg via ORAL

## 2018-07-23 NOTE — Patient Instructions (Signed)
[facility] Discharge Instructions for Patients Receiving Chemotherapy  Today you received the following chemotherapy agents: Bortezomib (Velcade)  To help prevent nausea and vomiting after your treatment, we encourage you to take your nausea medication  as prescribed.    If you develop nausea and vomiting that is not controlled by your nausea medication, call the clinic.   BELOW ARE SYMPTOMS THAT SHOULD BE REPORTED IMMEDIATELY:  *FEVER GREATER THAN 100.5 F  *CHILLS WITH OR WITHOUT FEVER  NAUSEA AND VOMITING THAT IS NOT CONTROLLED WITH YOUR NAUSEA MEDICATION  *UNUSUAL SHORTNESS OF BREATH  *UNUSUAL BRUISING OR BLEEDING  TENDERNESS IN MOUTH AND THROAT WITH OR WITHOUT PRESENCE OF ULCERS  *URINARY PROBLEMS  *BOWEL PROBLEMS  UNUSUAL RASH Items with * indicate a potential emergency and should be followed up as soon as possible.  Feel free to call the clinic should you have any questions or concerns. The clinic phone number is [phone].  Please show the CHEMO ALERT CARD at check-in to the Emergency Department and triage nurse.   

## 2018-07-30 ENCOUNTER — Inpatient Hospital Stay: Payer: Medicare Other

## 2018-07-30 VITALS — BP 117/75 | HR 83 | Temp 98.2°F | Resp 18

## 2018-07-30 DIAGNOSIS — C9000 Multiple myeloma not having achieved remission: Secondary | ICD-10-CM

## 2018-07-30 DIAGNOSIS — Z5112 Encounter for antineoplastic immunotherapy: Secondary | ICD-10-CM | POA: Diagnosis not present

## 2018-07-30 LAB — CMP (CANCER CENTER ONLY)
ALBUMIN: 3 g/dL — AB (ref 3.5–5.0)
ALK PHOS: 78 U/L (ref 38–126)
ALT: 14 U/L (ref 0–44)
AST: 16 U/L (ref 15–41)
Anion gap: 7 (ref 5–15)
BUN: 15 mg/dL (ref 6–20)
CO2: 29 mmol/L (ref 22–32)
CREATININE: 0.82 mg/dL (ref 0.61–1.24)
Calcium: 8.7 mg/dL — ABNORMAL LOW (ref 8.9–10.3)
Chloride: 104 mmol/L (ref 98–111)
GFR, Est AFR Am: >60 mL/min (ref >60)
GFR, Estimated: >60 mL/min (ref >60)
GLUCOSE: 99 mg/dL (ref 70–99)
Potassium: 3.5 mmol/L (ref 3.5–5.1)
SODIUM: 140 mmol/L (ref 135–145)
Total Bilirubin: <0.2 mg/dL — ABNORMAL LOW (ref 0.3–1.2)
Total Protein: 7.5 g/dL (ref 6.5–8.1)

## 2018-07-30 LAB — CBC WITH DIFFERENTIAL/PLATELET
Basophils Absolute: 0 10*3/uL (ref 0.0–0.1)
Basophils Relative: 0 %
EOS PCT: 2 %
Eosinophils Absolute: 0.2 10*3/uL (ref 0.0–0.5)
HCT: 36.2 % — ABNORMAL LOW (ref 38.4–49.9)
Hemoglobin: 11.4 g/dL — ABNORMAL LOW (ref 13.0–17.1)
LYMPHS ABS: 1.1 10*3/uL (ref 0.9–3.3)
Lymphocytes Relative: 10 %
MCH: 24.5 pg — AB (ref 27.2–33.4)
MCHC: 31.6 g/dL — AB (ref 32.0–36.0)
MCV: 77.5 fL — ABNORMAL LOW (ref 79.3–98.0)
MONOS PCT: 23 %
Monocytes Absolute: 2.5 10*3/uL — ABNORMAL HIGH (ref 0.1–0.9)
Neutro Abs: 7.1 10*3/uL — ABNORMAL HIGH (ref 1.5–6.5)
Neutrophils Relative %: 65 %
PLATELETS: 276 10*3/uL (ref 140–400)
RBC: 4.67 MIL/uL (ref 4.20–5.82)
RDW: 19.4 % — AB (ref 11.0–14.6)
WBC: 10.9 10*3/uL — ABNORMAL HIGH (ref 4.0–10.3)

## 2018-07-30 MED ORDER — PROCHLORPERAZINE MALEATE 10 MG PO TABS
ORAL_TABLET | ORAL | Status: AC
  Filled 2018-07-30: qty 1

## 2018-07-30 MED ORDER — BORTEZOMIB CHEMO SQ INJECTION 3.5 MG (2.5MG/ML)
1.3000 mg/m2 | Freq: Once | INTRAMUSCULAR | Status: AC
  Administered 2018-07-30: 1.75 mg via SUBCUTANEOUS
  Filled 2018-07-30: qty 0.7

## 2018-07-30 MED ORDER — PROCHLORPERAZINE MALEATE 10 MG PO TABS
10.0000 mg | ORAL_TABLET | Freq: Once | ORAL | Status: AC
  Administered 2018-07-30: 10 mg via ORAL

## 2018-07-30 NOTE — Patient Instructions (Signed)
[facility] Discharge Instructions for Patients Receiving Chemotherapy  Today you received the following chemotherapy agents: Bortezomib (Velcade)  To help prevent nausea and vomiting after your treatment, we encourage you to take your nausea medication  as prescribed.    If you develop nausea and vomiting that is not controlled by your nausea medication, call the clinic.   BELOW ARE SYMPTOMS THAT SHOULD BE REPORTED IMMEDIATELY:  *FEVER GREATER THAN 100.5 F  *CHILLS WITH OR WITHOUT FEVER  NAUSEA AND VOMITING THAT IS NOT CONTROLLED WITH YOUR NAUSEA MEDICATION  *UNUSUAL SHORTNESS OF BREATH  *UNUSUAL BRUISING OR BLEEDING  TENDERNESS IN MOUTH AND THROAT WITH OR WITHOUT PRESENCE OF ULCERS  *URINARY PROBLEMS  *BOWEL PROBLEMS  UNUSUAL RASH Items with * indicate a potential emergency and should be followed up as soon as possible.  Feel free to call the clinic should you have any questions or concerns. The clinic phone number is [phone].  Please show the CHEMO ALERT CARD at check-in to the Emergency Department and triage nurse.   

## 2018-07-31 ENCOUNTER — Other Ambulatory Visit: Payer: Self-pay | Admitting: Pulmonary Disease

## 2018-07-31 MED ORDER — ALBUTEROL SULFATE HFA 108 (90 BASE) MCG/ACT IN AERS: 2.0000 | INHALATION_SPRAY | Freq: Four times a day (QID) | RESPIRATORY_TRACT | 2 refills | Status: DC | PRN

## 2018-07-31 MED ORDER — FLUTICASONE-UMECLIDIN-VILANT 100-62.5-25 MCG/INH IN AEPB: 1.0000 | INHALATION_SPRAY | Freq: Every day | RESPIRATORY_TRACT | 5 refills | Status: AC

## 2018-07-31 NOTE — Progress Notes (Signed)
FMLA successfully faxed to [employer] at [phone]. Copy taken to Ms. Wilma at front desk for Oswaldo Milian to pick up on patient behalf.

## 2018-08-03 ENCOUNTER — Other Ambulatory Visit (HOSPITAL_COMMUNITY): Payer: Self-pay | Admitting: Interventional Radiology

## 2018-08-03 DIAGNOSIS — C9000 Multiple myeloma not having achieved remission: Secondary | ICD-10-CM

## 2018-08-05 ENCOUNTER — Other Ambulatory Visit: Payer: Self-pay | Admitting: Hematology

## 2018-08-05 NOTE — Progress Notes (Signed)
HEMATOLOGY/ONCOLOGY CONSULTATION NOTE  Date of Service: 08/06/2018  Patient Care Team: [name], PA-C as PCP - General (Physician Assistant) Michel Bickers, MD as Consulting Physician (Infectious Diseases) Brand Males, MD as Consulting Physician (Pulmonary Disease)  CHIEF COMPLAINTS/PURPOSE OF CONSULTATION:  Multiple myeloma  Oncologic History:  57 y.o.  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  Based on the diagnosis, patient was started on treatment with bortezomib & low-dose dexamethasone.  Initially, lenalidomide was omitted out of concern for high risk of for infectious complications.  We have continued to therapy with just a doublet treatment and did not reintensify the therapy.  Treatment course has been complicated by exacerbation of pulmonary hypertension and now with shingles reactivation resulting in blistering rash and skin ulcerations on the left flank.  Infection appears to be responding to higher dose of acyclovir and skin is healing.   HISTORY OF PRESENTING ILLNESS:   [name] [name] is a wonderful 57 y.o. male who has been referred to [place] by my colleague Dr [name] for evaluation and management of Multiple myeloma. He is accompanied today by a Optometrist. The pt reports that he is doing well overall.   The pt has been followed by my colleague Dr [name] and has been treated with Velcade and dexamethasone.    The pt reports that he has lost some weight and hasn't eaten as much because he hasn't been able to taste his food as well.   He notes that his shingles rash has continued to clear up and he denies pain associated with his remaining rash. His back pain continues and he has run out of his Hydromorphone. He notes that his back  pain has not gotten better since he was first evaluated. He notes that he feels tired and has a little SOB.   Most recent lab results (04/07/18) of CBC and CMP is as follows: all values are WNL except for Sodium at 134, chloride at 96, Total Protein at 8.7, Albumin at 3.0, Total bilirubin <0.2, WBC at 19.1k, HGB at 10.7, HCT at 33.2, MCV at 73.5, MCH at 23.7, RDW at 18.4, Platelets at 637k, ANC at 17.1k.Marland Kitchen MMP 04/07/18 showed IgG elevated at 2301 and M Protein at 1.5. Ferritin 04/07/18 was elevated at 436 Iron/TIBC 04/07/18 showed at 12% saturation ratio LDH 04/07/18 is WNL at 171 Kappa/Lambda 04/07/18 showed Kappa at 25.2 and K:L ratio at 2.27  On review of systems, pt reports some SOB, back pain, resolving shingles rash, mouth sores, weight loss, and denies fevers, chills, new skin rashes, and any other symptoms.  Interval History:   [name] [name] returns today for management and evaluation of his multiple myeloma. The patient's last visit with [place] was on 07/09/18. He is accompanied today by a Optometrist. The pt reports that he is doing well overall.   The pt reports that he has been taking Revlimid three weeks on and one week off. He denies nausea, vomiting, diarrhea, and skin rashes. He notes that he has stable energy levels. He notes that the top and bottom of his feet are slightly numb and is overall improving or at least stable.   Lab results today (08/06/18) of CBC w/diff, CMP, and Reticulocytes is as follows: all values are WNL except for HGB at 11.4, HCT at  36.1, MCV at 77.5, MCH at 24.5, MCHC at 31.6, RDW at 18.2, Monocytes abs at 1.8k, Potassium at 3.0, Calcium at 8.3, Albumin at 2.9, Total bilirubin at <0.2.  On review of systems, pt reports good energy levels, and denies worsening neuropathy, nausea, vomiting, diarrhea, skin rashes, back pain, and any other symptoms.   MEDICAL HISTORY:  Past Medical History:  Diagnosis Date  . Acute on chronic respiratory failure ([place]) 02/07/2018  . Hypertension   .  Kidney stones   . Pulmonary hypertension ([place])     SURGICAL HISTORY: Past Surgical History:  Procedure Laterality Date  . CARDIAC CATHETERIZATION N/A 01/18/2016   Procedure: Right Heart Cath;  Surgeon: Jolaine Artist, MD;  Location: [facility];  Service: Cardiovascular;  Laterality: N/A;  . CARDIAC CATHETERIZATION N/A 06/24/2016   Procedure: Right Heart Cath;  Surgeon: Jolaine Artist, MD;  Location: [facility];  Service: Cardiovascular;  Laterality: N/A;  . IR RADIOLOGIST EVAL & MGMT  05/27/2018  . KIDNEY SURGERY    . VIDEO ASSISTED THORACOSCOPY (VATS)/DECORTICATION Left 2003  . VIDEO ASSISTED THORACOSCOPY (VATS)/DECORTICATION Left 10/02/2015   Procedure: VIDEO ASSISTED THORACOSCOPY (VATS)/DECORTICATION and drainage of chronic empyena;  Surgeon: [name], MD;  Location: [facility];  Service: Thoracic;  Laterality: Left;  Marland Kitchen VIDEO BRONCHOSCOPY N/A 10/02/2015   Procedure: VIDEO BRONCHOSCOPY;  Surgeon: [name], MD;  Location: [facility];  Service: Thoracic;  Laterality: N/A;    SOCIAL HISTORY: Social History   Socioeconomic History  . Marital status: Single    Spouse name: Not on file  . Number of children: Not on file  . Years of education: Not on file  . Highest education level: Not on file  Occupational History  . Not on file  Social Needs  . Financial resource strain: Not on file  . Food insecurity:    Worry: Not on file    Inability: Not on file  . Transportation needs:    Medical: Not on file    Non-medical: Not on file  Tobacco Use  . Smoking status: Never Smoker  . Smokeless tobacco: Never Used  Substance and Sexual Activity  . Alcohol use: Yes  . Drug use: No  . Sexual activity: Not on file  Lifestyle  . Physical activity:    Days per week: Not on file    Minutes per session: Not on file  . Stress: Not on file  Relationships  . Social connections:    Talks on phone: Not on file    Gets together: Not on file    Attends religious  service: Not on file    Active member of club or organization: Not on file    Attends meetings of clubs or organizations: Not on file    Relationship status: Not on file  . Intimate partner violence:    Fear of current or ex partner: Not on file    Emotionally abused: Not on file    Physically abused: Not on file    Forced sexual activity: Not on file  Other Topics Concern  . Not on file  Social History Narrative  . Not on file    FAMILY HISTORY: Family History  Problem Relation Age of Onset  . Hypertension Other     ALLERGIES:  has No Known Allergies.  MEDICATIONS:  Current Outpatient Medications  Medication Sig Dispense Refill  . acyclovir (ZOVIRAX) 400 MG tablet Take 400 mg by mouth daily.  3  . albuterol (  PROVENTIL HFA;VENTOLIN HFA) 108 (90 Base) MCG/ACT inhaler Inhale 2 puffs into the lungs every 6 (six) hours as needed for wheezing or shortness of breath. 1 Inhaler 2  . docusate sodium (COLACE) 50 MG capsule Take 1 capsule (50 mg total) by mouth daily. 30 capsule 0  . Fluticasone-Umeclidin-Vilant (TRELEGY ELLIPTA) 100-62.5-25 MCG/INH AEPB Inhale 1 puff into the lungs daily. 1 each 5  . lenalidomide (REVLIMID) 15 MG capsule Take 1 capsule (15 mg total) by mouth daily. Authorization #[id number] 07/09/18 21 capsule 2  . nortriptyline (PAMELOR) 25 MG capsule Take 1 capsule (25 mg total) by mouth at bedtime. 30 capsule 3  . nortriptyline (PAMELOR) 25 MG capsule Take 25 mg by mouth.  3  . omeprazole (PRILOSEC) 40 MG capsule Take 1 capsule (40 mg total) by mouth daily. 30 capsule 5  . OXYGEN 2lpm BEDTIME ONLY    . umeclidinium-vilanterol (ANORO ELLIPTA) 62.5-25 MCG/INH AEPB Inhale 1 puff into the lungs daily. 60 each 5   No current facility-administered medications for this visit.     REVIEW OF SYSTEMS:    A 10+ POINT REVIEW OF SYSTEMS WAS OBTAINED including neurology, dermatology, psychiatry, cardiac, respiratory, lymph, extremities, GI, GU, Musculoskeletal, constitutional,  breasts, reproductive, HEENT.  All pertinent positives are noted in the HPI.  All others are negative.   PHYSICAL EXAMINATION: ECOG PERFORMANCE STATUS: 2 - Symptomatic, <50% confined to bed  Vitals:   08/06/18 1352  BP: 123/75  Pulse: 80  Resp: 18  Temp: 98.3 F (36.8 C)  SpO2: 97%   Filed Weights   08/06/18 1352  Weight: 87 lb 9.6 oz (39.7 kg)   .Body mass index is 17.18 kg/m.  GENERAL:alert, in no acute distress and comfortable SKIN: no acute rashes, no significant lesions EYES: conjunctiva are pink and non-injected, sclera anicteric OROPHARYNX: MMM, no exudates, no oropharyngeal erythema or ulceration NECK: supple, no JVD LYMPH:  no palpable lymphadenopathy in the cervical, axillary or inguinal regions LUNGS: clear to auscultation b/l with normal respiratory effort HEART: regular rate & rhythm ABDOMEN:  normoactive bowel sounds , non tender, not distended. No palpable hepatosplenomegaly.  Extremity: no pedal edema PSYCH: alert & oriented x 3 with fluent speech NEURO: no focal motor/sensory deficits   LABORATORY DATA:  I have reviewed the data as listed  . CBC Latest Ref Rng & Units 08/06/2018 07/30/2018 07/23/2018  WBC 4.0 - 10.3 K/uL 9.4 10.9(H) 7.2  Hemoglobin 13.0 - 17.1 g/dL 11.4(L) 11.4(L) 11.1(L)  Hematocrit 38.4 - 49.9 % 36.1(L) 36.2(L) 35.0(L)  Platelets 140 - 400 K/uL 367 276 289   . CBC    Component Value Date/Time   WBC 9.4 08/06/2018 1215   RBC 4.66 08/06/2018 1215   HGB 11.4 (L) 08/06/2018 1215   HGB 10.7 (L) 04/07/2018 1429   HGB 9.5 (L) 03/05/2018 1206   HGB 13.1 11/07/2017 1014   HCT 36.1 (L) 08/06/2018 1215   HCT 32.3 (L) 03/05/2018 1206   HCT 39.7 11/07/2017 1014   PLT 367 08/06/2018 1215   PLT 637 (H) 04/07/2018 1429   PLT 474 (H) 03/05/2018 1206   MCV 77.5 (L) 08/06/2018 1215   MCV 82 03/05/2018 1206   MCV 87.6 11/07/2017 1014   MCH 24.5 (L) 08/06/2018 1215   MCHC 31.6 (L) 08/06/2018 1215   RDW 18.2 (H) 08/06/2018 1215   RDW 20.5  (H) 03/05/2018 1206   RDW 14.5 11/07/2017 1014   LYMPHSABS 1.3 08/06/2018 1215   LYMPHSABS 1.5 03/05/2018 1206   LYMPHSABS 1.7 11/07/2017  1014   MONOABS 1.8 (H) 08/06/2018 1215   MONOABS 0.8 11/07/2017 1014   EOSABS 0.2 08/06/2018 1215   EOSABS 0.4 03/05/2018 1206   BASOSABS 0.1 08/06/2018 1215   BASOSABS 0.1 03/05/2018 1206   BASOSABS 0.0 11/07/2017 1014    . CMP Latest Ref Rng & Units 08/06/2018 07/30/2018 07/23/2018  Glucose 70 - 99 mg/dL 96 99 117(H)  BUN 6 - 20 mg/dL '9 15 11  ' Creatinine 0.61 - 1.24 mg/dL 0.80 0.82 0.93  Sodium 135 - 145 mmol/L 141 140 137  Potassium 3.5 - 5.1 mmol/L 3.0(LL) 3.5 3.9  Chloride 98 - 111 mmol/L 105 104 99  CO2 22 - 32 mmol/L '28 29 31  ' Calcium 8.9 - 10.3 mg/dL 8.3(L) 8.7(L) 9.3  Total Protein 6.5 - 8.1 g/dL 7.2 7.5 7.3  Total Bilirubin 0.3 - 1.2 mg/dL <0.2(L) <0.2(L) 0.2(L)  Alkaline Phos 38 - 126 U/L 68 78 81  AST 15 - 41 U/L '15 16 19  ' ALT 0 - 44 U/L '11 14 11   ' 03/18/18 Cytogenetics:  03/18/18 Pathology:  11/03/17 Cytogenetics:    RADIOGRAPHIC STUDIES: I have personally reviewed the radiological images as listed and agreed with the findings in the report. No results found.  ASSESSMENT & PLAN:   57 y.o. male with  1. 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  Based on the diagnosis, patient was started on treatment with bortezomib & low-dose dexamethasone.  Initially, lenalidomide was omitted out of concern for high risk of for infectious complications.  We have continued to therapy with just a doublet treatment and did not reintensify the therapy.  Treatment course has been complicated by exacerbation of pulmonary hypertension and now with shingles reactivation BM 03/18/18 shows limited response with still 18% plasma cell in the bone marrow. Cytogenetics shows   14.5% [id number]  2. Hypokalemia - likely from potassium replacement. PLAN:  -Grade I neuropathy, will continue to watch this and pt will let me know if this worsens -not needing pain medications for his back pain now. -Pt has not had h/o blood clots -continue Acyclovir prophylaxis -Discussed pt labwork today, 08/06/18; Potassium low at 3.0 -Will order potassium replacement and encouraged pt to eat bananas and sweet potatoes   -The pt has no prohibitive toxicities from continuing Revlimid, Velcade and Dexamethasone at this time.   -Continue 36m aspirin  -Recommend that the pt begin taking Vitamin B complex    -continue Velcade as per orders - please schedule next 2 months -labs weekly -RTC with Dr KIrene Limboin 4 weeks    All of the patients questions were answered with apparent satisfaction. The patient knows to call the clinic with any problems, questions or concerns.  The total time spent in the appt was 25 minutes and more than 50% was on counseling and direct patient cares.     GSullivan LoneMD MS AAHIVMS SSamaritan HospitalCCgs Endoscopy Center PLLCHematology/Oncology Physician CThe [facility] (Office):       [phone](Work cell):  [phone](Fax):           [phone] 08/06/2018 2:06 PM  I, SBaldwin Jamaica am acting as a sEducation administratorfor Dr. KIrene Limbo .I have reviewed the above documentation for accuracy and completeness, and I agree with the above. .Brunetta GeneraMD

## 2018-08-06 ENCOUNTER — Inpatient Hospital Stay: Payer: Medicare Other

## 2018-08-06 ENCOUNTER — Inpatient Hospital Stay (HOSPITAL_BASED_OUTPATIENT_CLINIC_OR_DEPARTMENT_OTHER): Payer: Medicare Other | Admitting: Hematology

## 2018-08-06 ENCOUNTER — Telehealth: Payer: Self-pay

## 2018-08-06 ENCOUNTER — Inpatient Hospital Stay: Payer: Medicare Other | Attending: Hematology

## 2018-08-06 ENCOUNTER — Other Ambulatory Visit: Payer: Self-pay

## 2018-08-06 VITALS — BP 123/75 | HR 80 | Temp 98.3°F | Resp 18 | Ht 59.88 in | Wt 87.6 lb

## 2018-08-06 DIAGNOSIS — C9000 Multiple myeloma not having achieved remission: Secondary | ICD-10-CM

## 2018-08-06 DIAGNOSIS — Z79899 Other long term (current) drug therapy: Secondary | ICD-10-CM | POA: Diagnosis not present

## 2018-08-06 DIAGNOSIS — G629 Polyneuropathy, unspecified: Secondary | ICD-10-CM | POA: Insufficient documentation

## 2018-08-06 DIAGNOSIS — Z7982 Long term (current) use of aspirin: Secondary | ICD-10-CM | POA: Insufficient documentation

## 2018-08-06 DIAGNOSIS — E876 Hypokalemia: Secondary | ICD-10-CM | POA: Diagnosis not present

## 2018-08-06 DIAGNOSIS — Z5112 Encounter for antineoplastic immunotherapy: Secondary | ICD-10-CM | POA: Insufficient documentation

## 2018-08-06 DIAGNOSIS — Z5111 Encounter for antineoplastic chemotherapy: Secondary | ICD-10-CM

## 2018-08-06 LAB — CMP (CANCER CENTER ONLY)
ALK PHOS: 68 U/L (ref 38–126)
ALT: 11 U/L (ref 0–44)
AST: 15 U/L (ref 15–41)
Albumin: 2.9 g/dL — ABNORMAL LOW (ref 3.5–5.0)
Anion gap: 8 (ref 5–15)
BUN: 9 mg/dL (ref 6–20)
CALCIUM: 8.3 mg/dL — AB (ref 8.9–10.3)
CO2: 28 mmol/L (ref 22–32)
CREATININE: 0.8 mg/dL (ref 0.61–1.24)
Chloride: 105 mmol/L (ref 98–111)
Glucose, Bld: 96 mg/dL (ref 70–99)
Potassium: 3 mmol/L — CL (ref 3.5–5.1)
Sodium: 141 mmol/L (ref 135–145)
Total Protein: 7.2 g/dL (ref 6.5–8.1)

## 2018-08-06 LAB — CBC WITH DIFFERENTIAL/PLATELET
Basophils Absolute: 0.1 10*3/uL (ref 0.0–0.1)
Basophils Relative: 1 %
Eosinophils Absolute: 0.2 10*3/uL (ref 0.0–0.5)
Eosinophils Relative: 2 %
HEMATOCRIT: 36.1 % — AB (ref 38.4–49.9)
HEMOGLOBIN: 11.4 g/dL — AB (ref 13.0–17.1)
LYMPHS ABS: 1.3 10*3/uL (ref 0.9–3.3)
LYMPHS PCT: 14 %
MCH: 24.5 pg — AB (ref 27.2–33.4)
MCHC: 31.6 g/dL — ABNORMAL LOW (ref 32.0–36.0)
MCV: 77.5 fL — AB (ref 79.3–98.0)
Monocytes Absolute: 1.8 10*3/uL — ABNORMAL HIGH (ref 0.1–0.9)
Monocytes Relative: 19 %
NEUTROS ABS: 5.9 10*3/uL (ref 1.5–6.5)
NEUTROS PCT: 64 %
Platelets: 367 10*3/uL (ref 140–400)
RBC: 4.66 MIL/uL (ref 4.20–5.82)
RDW: 18.2 % — ABNORMAL HIGH (ref 11.0–14.6)
WBC: 9.4 10*3/uL (ref 4.0–10.3)

## 2018-08-06 MED ORDER — PROCHLORPERAZINE MALEATE 10 MG PO TABS
10.0000 mg | ORAL_TABLET | Freq: Once | ORAL | Status: AC
  Administered 2018-08-06: 10 mg via ORAL

## 2018-08-06 MED ORDER — ASPIRIN EC 81 MG PO TBEC: 81.0000 mg | DELAYED_RELEASE_TABLET | Freq: Every day | ORAL | Status: DC

## 2018-08-06 MED ORDER — POTASSIUM CHLORIDE CRYS ER 20 MEQ PO TBCR
40.0000 meq | EXTENDED_RELEASE_TABLET | Freq: Once | ORAL | Status: AC
  Administered 2018-08-06: 40 meq via ORAL

## 2018-08-06 MED ORDER — LENALIDOMIDE 15 MG PO CAPS: 15.0000 mg | ORAL_CAPSULE | Freq: Every day | ORAL | 2 refills | Status: DC

## 2018-08-06 MED ORDER — BORTEZOMIB CHEMO SQ INJECTION 3.5 MG (2.5MG/ML)
1.3000 mg/m2 | Freq: Once | INTRAMUSCULAR | Status: AC
  Administered 2018-08-06: 1.75 mg via SUBCUTANEOUS
  Filled 2018-08-06: qty 0.7

## 2018-08-06 MED ORDER — POTASSIUM CHLORIDE CRYS ER 20 MEQ PO TBCR: 20.0000 meq | EXTENDED_RELEASE_TABLET | Freq: Two times a day (BID) | ORAL | 1 refills | Status: DC

## 2018-08-06 MED ORDER — PROCHLORPERAZINE MALEATE 10 MG PO TABS
ORAL_TABLET | ORAL | Status: AC
  Filled 2018-08-06: qty 1

## 2018-08-06 MED ORDER — POTASSIUM CHLORIDE CRYS ER 20 MEQ PO TBCR
EXTENDED_RELEASE_TABLET | ORAL | Status: AC
  Filled 2018-08-06: qty 2

## 2018-08-06 MED ORDER — DEXAMETHASONE 4 MG PO TABS: 20.0000 mg | ORAL_TABLET | ORAL | 1 refills | Status: DC

## 2018-08-06 MED FILL — POTASSIUM CL ER 20 MEQ TABL: 20 | 30 days supply | Qty: 60 | Fill #0

## 2018-08-06 MED FILL — DEXAMETHASONE 4 MG TABLET: 4 | 28 days supply | Qty: 20 | Fill #0

## 2018-08-06 NOTE — Telephone Encounter (Signed)
Dr. Irene Limbo made aware of panic potassium level of 3.0

## 2018-08-06 NOTE — Progress Notes (Signed)
Noted serum potassium level. Per Wendall Mola RN, Dr. Irene Limbo aware and script sent to patient's pharmacy. No additional orders for treatment today.

## 2018-08-06 NOTE — Patient Instructions (Signed)
Begin Vitamin B complex vitamin one a day

## 2018-08-07 ENCOUNTER — Telehealth: Payer: Self-pay

## 2018-08-07 NOTE — Telephone Encounter (Signed)
Spoke with patient on the phone and will also mail a calender with a letter enclosed of the new appointments added.m per 10/3 los

## 2018-08-14 ENCOUNTER — Inpatient Hospital Stay: Payer: Medicare Other

## 2018-08-14 VITALS — BP 139/80 | HR 87 | Temp 98.9°F | Resp 17 | Wt 86.4 lb

## 2018-08-14 DIAGNOSIS — Z7189 Other specified counseling: Secondary | ICD-10-CM

## 2018-08-14 DIAGNOSIS — Z5112 Encounter for antineoplastic immunotherapy: Secondary | ICD-10-CM | POA: Diagnosis not present

## 2018-08-14 DIAGNOSIS — C9000 Multiple myeloma not having achieved remission: Secondary | ICD-10-CM

## 2018-08-14 LAB — CMP (CANCER CENTER ONLY)
ALBUMIN: 2.8 g/dL — AB (ref 3.5–5.0)
ALK PHOS: 71 U/L (ref 38–126)
ALT: 9 U/L (ref 0–44)
ANION GAP: 8 (ref 5–15)
AST: 12 U/L — ABNORMAL LOW (ref 15–41)
BUN: 10 mg/dL (ref 6–20)
CALCIUM: 9.2 mg/dL (ref 8.9–10.3)
CO2: 30 mmol/L (ref 22–32)
Chloride: 98 mmol/L (ref 98–111)
Creatinine: 0.94 mg/dL (ref 0.61–1.24)
GFR, Est AFR Am: >60 mL/min (ref >60)
GFR, Estimated: >60 mL/min (ref >60)
GLUCOSE: 92 mg/dL (ref 70–99)
Potassium: 4.6 mmol/L (ref 3.5–5.1)
SODIUM: 136 mmol/L (ref 135–145)
Total Bilirubin: 0.3 mg/dL (ref 0.3–1.2)
Total Protein: 7.3 g/dL (ref 6.5–8.1)

## 2018-08-14 LAB — CBC WITH DIFFERENTIAL/PLATELET
ABS IMMATURE GRANULOCYTES: 0.03 10*3/uL (ref 0.00–0.07)
Basophils Absolute: 0.1 10*3/uL (ref 0.0–0.1)
Basophils Relative: 1 %
Eosinophils Absolute: 0.2 10*3/uL (ref 0.0–0.5)
Eosinophils Relative: 2 %
HEMATOCRIT: 35 % — AB (ref 39.0–52.0)
Hemoglobin: 11.2 g/dL — ABNORMAL LOW (ref 13.0–17.0)
IMMATURE GRANULOCYTES: 0 %
LYMPHS ABS: 1.1 10*3/uL (ref 0.7–4.0)
Lymphocytes Relative: 12 %
MCH: 24.4 pg — ABNORMAL LOW (ref 26.0–34.0)
MCHC: 32 g/dL (ref 30.0–36.0)
MCV: 76.3 fL — AB (ref 80.0–100.0)
MONOS PCT: 19 %
Monocytes Absolute: 1.7 10*3/uL — ABNORMAL HIGH (ref 0.1–1.0)
NEUTROS ABS: 5.8 10*3/uL (ref 1.7–7.7)
NEUTROS PCT: 66 %
Platelets: 577 10*3/uL — ABNORMAL HIGH (ref 150–400)
RBC: 4.59 MIL/uL (ref 4.22–5.81)
RDW: 16.4 % — ABNORMAL HIGH (ref 11.5–15.5)
WBC: 8.9 10*3/uL (ref 4.0–10.5)
nRBC: 0 % (ref 0.0–0.2)

## 2018-08-14 MED ORDER — PROCHLORPERAZINE MALEATE 10 MG PO TABS
ORAL_TABLET | ORAL | Status: AC
  Filled 2018-08-14: qty 1

## 2018-08-14 MED ORDER — SODIUM CHLORIDE 0.9 % IV SOLN
INTRAVENOUS | Status: DC
  Administered 2018-08-14: 15:00:00 via INTRAVENOUS
  Filled 2018-08-14: qty 250

## 2018-08-14 MED ORDER — BORTEZOMIB CHEMO SQ INJECTION 3.5 MG (2.5MG/ML)
1.3000 mg/m2 | Freq: Once | INTRAMUSCULAR | Status: AC
  Administered 2018-08-14: 1.75 mg via SUBCUTANEOUS
  Filled 2018-08-14: qty 0.7

## 2018-08-14 MED ORDER — ZOLEDRONIC ACID 4 MG/5ML IV CONC
3.5000 mg | Freq: Once | INTRAVENOUS | Status: AC
  Administered 2018-08-14: 3.5 mg via INTRAVENOUS
  Filled 2018-08-14: qty 4.38

## 2018-08-14 MED ORDER — PROCHLORPERAZINE MALEATE 10 MG PO TABS
10.0000 mg | ORAL_TABLET | Freq: Once | ORAL | Status: AC
  Administered 2018-08-14: 10 mg via ORAL

## 2018-08-14 NOTE — Patient Instructions (Signed)
[facility] Discharge Instructions for Patients Receiving Chemotherapy  Today you received the following chemotherapy agents: Velcade and Zometa  To help prevent nausea and vomiting after your treatment, we encourage you to take your nausea medication as directed.   If you develop nausea and vomiting that is not controlled by your nausea medication, call the clinic.   BELOW ARE SYMPTOMS THAT SHOULD BE REPORTED IMMEDIATELY:  *FEVER GREATER THAN 100.5 F  *CHILLS WITH OR WITHOUT FEVER  NAUSEA AND VOMITING THAT IS NOT CONTROLLED WITH YOUR NAUSEA MEDICATION  *UNUSUAL SHORTNESS OF BREATH  *UNUSUAL BRUISING OR BLEEDING  TENDERNESS IN MOUTH AND THROAT WITH OR WITHOUT PRESENCE OF ULCERS  *URINARY PROBLEMS  *BOWEL PROBLEMS  UNUSUAL RASH Items with * indicate a potential emergency and should be followed up as soon as possible.  Feel free to call the clinic should you have any questions or concerns. The clinic phone number is [phone].  Please show the CHEMO ALERT CARD at check-in to the Emergency Department and triage nurse.   

## 2018-08-17 ENCOUNTER — Encounter: Payer: Self-pay | Admitting: Pulmonary Disease

## 2018-08-17 ENCOUNTER — Ambulatory Visit (INDEPENDENT_AMBULATORY_CARE_PROVIDER_SITE_OTHER): Payer: Self-pay | Admitting: Pulmonary Disease

## 2018-08-17 VITALS — BP 112/78 | HR 85 | Wt 82.8 lb

## 2018-08-17 DIAGNOSIS — G44209 Tension-type headache, unspecified, not intractable: Secondary | ICD-10-CM

## 2018-08-17 DIAGNOSIS — C9000 Multiple myeloma not having achieved remission: Secondary | ICD-10-CM

## 2018-08-17 DIAGNOSIS — J479 Bronchiectasis, uncomplicated: Secondary | ICD-10-CM

## 2018-08-17 DIAGNOSIS — I272 Pulmonary hypertension, unspecified: Secondary | ICD-10-CM

## 2018-08-17 DIAGNOSIS — J9611 Chronic respiratory failure with hypoxia: Secondary | ICD-10-CM

## 2018-08-17 DIAGNOSIS — J449 Chronic obstructive pulmonary disease, unspecified: Secondary | ICD-10-CM

## 2018-08-17 NOTE — Progress Notes (Signed)
Subjective:    Patient ID: [name] [name], male    DOB: [birth year], 57 y.o.   MRN: [id number]  Synopsis: Referred in 2018 for evaluation of pulmonary hypertension in the setting of chronic left-sided hydropneumothorax and bronchiectasis.  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  Apparently this was originally treated as an empyema with VATS decortication in 2016.  CT imaging since then confirms bronchiectasis in the right lung and pulmonary function testing shows severe airflow obstruction.  In 2017 01/2017 he has had evidence of exertional hypoxemia.  Compliance with medications and oxygen use has been a challenge.  HPI Chief Complaint  Patient presents with  . Follow-up    dry throat, neck pain, back pain    He continues to receive treatments for his multiple myeloma with Velcade.  He says that since his last injection on Friday he has had some neck and headache pain.  This has been fairly steady.  No injury, no fall.  The pain is a little worse when he turns his head back and forth.  He says that his breathing has been the same.  No problems with cough.  He does have some sinus congestion and postnasal drip.  He denies problems with bronchitis or pneumonia.  He has been using oxygen and Trelegy regularly.  Past Medical History:  Diagnosis Date  . Acute on chronic respiratory failure ([place]) 02/07/2018  . Hypertension   . Kidney stones   . Pulmonary hypertension ([place])        Review of Systems  Constitutional: Negative for fever and unexpected weight change.  HENT: Negative for congestion, dental problem, ear pain, nosebleeds, postnasal drip, rhinorrhea, sinus pressure, sneezing, sore throat and trouble swallowing.   Eyes: Negative for redness and itching.  Respiratory: Positive for cough, chest tightness and shortness of breath. Negative for wheezing.   Cardiovascular: Negative for palpitations and leg swelling.  Gastrointestinal: Negative for nausea and vomiting.    Genitourinary: Negative for dysuria.  Musculoskeletal: Negative for joint swelling.  Skin: Negative for rash.  Neurological: Negative for headaches.  Hematological: Does not bruise/bleed easily.  Psychiatric/Behavioral: Negative for dysphoric mood. The patient is not nervous/anxious.        Objective:   Physical Exam  Vitals:   08/17/18 0932  BP: 112/78  Pulse: 85  SpO2: 96%  Weight: 82 lb 12.8 oz (37.6 kg)  RA  Ambulated 500 feet on room air and O2 saturation remained at 95%  Gen: chronically ill appearing HENT: OP clear, TM's clear, neck supple PULM: Diminished left base, crackles R base B, normal percussion CV: RRR, no mgr, trace edema GI: BS+, soft, nontender Derm: no cyanosis or rash Psyche: normal mood and affect   RHC: 06/2017 RA = 3, RV = 59/6   PA = 62/23 (40) PCW = 6 Fick cardiac output/index = 4.2/2.9 PVR = 8.1 WU Ao sat = 93%  PA sat = 67%, 68%  Echo: August 2018 LVEF 60-65%, D shaped interventricular septum suggestive of RV pressure volume overload, moderately dilated right ventricle with moderately depressed systolic function, PA pressure estimate 59 mmHg, RA dilation, valvues OK, moderate Tricuspid regurgitation  Chest imaging: March 2019 CT chest shows a chronic appearing hydropneumothorax on the left, significant bronchiectasis in the right lower lobe August 2018 chest x-ray images independently reviewed the prominent pulmonary vasculature, scar versus consolidation right lung, chronic appearing left-sided pleural effusion with apical pneumothorax4 January 2018 CT angiogram chest images independently reviewed showing chronic appearing hydropneumothorax in  the left lung, scattered consolidation patches in the right upper lobe and right lower lobe, there is also bronchiectasis changes in the right lower lobe.   Pulmonary function test: April 2017 ratio 45%, FEV1 0.56 L 21% predicted, FVC 1.25 L 37% predicted, total lung capacity 2.88 L 58% predicted, DLCO  10.30 54% predicted  Micro: 11/2016 Sputum: normal flora 05/2018 Sputum AFB negative 05/2018 Sputum fungus: Aspergillus fumigatus 05/2018 Sputum culture: no growth  Labs: 04/2016 Alpha 1 MZ, normal level; total IgG level severely elevated, IgA normal, IgM low, IgE 2,066 August 2019 Aspergillus specific IgE testing showed Aspergillus fumigatus IgE 8.02 kilounits/L, all other units negative  CBC    Component Value Date/Time   WBC 8.9 08/14/2018 1306   RBC 4.59 08/14/2018 1306   HGB 11.2 (L) 08/14/2018 1306   HGB 10.7 (L) 04/07/2018 1429   HGB 9.5 (L) 03/05/2018 1206   HGB 13.1 11/07/2017 1014   HCT 35.0 (L) 08/14/2018 1306   HCT 32.3 (L) 03/05/2018 1206   HCT 39.7 11/07/2017 1014   PLT 577 (H) 08/14/2018 1306   PLT 637 (H) 04/07/2018 1429   PLT 474 (H) 03/05/2018 1206   MCV 76.3 (L) 08/14/2018 1306   MCV 82 03/05/2018 1206   MCV 87.6 11/07/2017 1014   MCH 24.4 (L) 08/14/2018 1306   MCHC 32.0 08/14/2018 1306   RDW 16.4 (H) 08/14/2018 1306   RDW 20.5 (H) 03/05/2018 1206   RDW 14.5 11/07/2017 1014   LYMPHSABS 1.1 08/14/2018 1306   LYMPHSABS 1.5 03/05/2018 1206   LYMPHSABS 1.7 11/07/2017 1014   MONOABS 1.7 (H) 08/14/2018 1306   MONOABS 0.8 11/07/2017 1014   EOSABS 0.2 08/14/2018 1306   EOSABS 0.4 03/05/2018 1206   BASOSABS 0.1 08/14/2018 1306   BASOSABS 0.1 03/05/2018 1206   BASOSABS 0.0 11/07/2017 1014   Records from his visit with oncology for multiple myeloma on October 3 reviewed, he was continued on Velcade with weekly labs.     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  However, blood work from the last visit is highly worrisome  for allergic bronchopulmonary aspergillosis as his serum IgE specific to Aspergillus fumigatus was elevated in the presence of a positive culture for Aspergillus as well as known bronchiectasis.  That said, right now he does not have any complaints of worsening respiratory symptoms so I see no reason to treat this.  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  He will continue treatment for his multiple myeloma.  In regards to his pulmonary hypertension he was advised to continue using oxygen regularly.  He has [place] group 3 disease and would not benefit from a pulmonary vasodilator.  Plan: Bronchiectasis: This term means that your airways are bigger and more dilated than they should be Continue taking Trelegy 1 puff daily Continue to use a flutter valve 4 to 5 breaths, 4-5 times a day I am glad you have had a flu shot Practice good hand hygiene Stay active Call us if you have worsening chest congestion or mucus production.  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 that you are having an allergic reaction  to a mold in your airways The dexamethasone you are taking is controlling this If you have a change in symptoms (chest congestion or mucus production) then call us right away to let us know  Pulmonary hypertension: This term means that the blood pressure in your lungs is higher than it should be Continue using oxygen continuously  Multiple myeloma, Continue taking Velcade and dexamethasone as directed by the hematology oncology clinic  Headache/neck pain:  Take ibuprofen 464m by mouth as needed for this pain However, if the pain worsens be sure to let the heme-onc doctors know  Follow-up with me in February or sooner if needed   Current Outpatient Medications:  .  acyclovir (ZOVIRAX) 400 MG tablet, Take 400 mg by mouth daily., Disp: , Rfl: 3 .  albuterol (PROVENTIL  HFA;VENTOLIN HFA) 108 (90 Base) MCG/ACT inhaler, Inhale 2 puffs into the lungs every 6 (six) hours as needed for wheezing or shortness of breath., Disp: 1 Inhaler, Rfl: 2 .  aspirin EC 81 MG tablet, Take 1 tablet (81 mg total) by mouth daily., Disp: , Rfl:  .  dexamethasone (DECADRON) 4 MG tablet, TAKE 5 TABLETS (20 MG TOTAL) BY MOUTH ONCE A WEEK., Disp: 20 tablet, Rfl: 3 .  docusate sodium (COLACE) 50 MG capsule, Take 1 capsule (50 mg total) by mouth daily., Disp: 30 capsule, Rfl: 0 .  Fluticasone-Umeclidin-Vilant (TRELEGY ELLIPTA) 100-62.5-25 MCG/INH AEPB, Inhale 1 puff into the lungs daily., Disp: 1 each, Rfl: 5 .  lenalidomide (REVLIMID) 15 MG capsule, Take 1 capsule (15 mg total) by mouth daily. Authorization ##[id number], Disp: 21 capsule, Rfl: 2 .  nortriptyline (PAMELOR) 25 MG capsule, Take 25 mg by mouth., Disp: , Rfl: 3 .  omeprazole (PRILOSEC) 40 MG capsule, Take 1 capsule (40 mg total) by mouth daily., Disp: 30 capsule, Rfl: 5 .  OXYGEN, 2lpm BEDTIME ONLY, Disp: , Rfl:  .  potassium chloride SA (K-DUR,KLOR-CON) 20 MEQ tablet, Take 1 tablet (20 mEq total) by mouth 2 (two) times daily., Disp: 60 tablet, Rfl: 1 .  umeclidinium-vilanterol (ANORO ELLIPTA) 62.5-25 MCG/INH AEPB, Inhale 1 puff into the lungs daily., Disp: 60 each, Rfl: 5 .  nortriptyline (PAMELOR) 25 MG capsule, Take 1 capsule (25 mg total) by mouth at bedtime., Disp: 30 capsule, Rfl: 3

## 2018-08-17 NOTE — Patient Instructions (Signed)
Bronchiectasis: This term means that your airways are bigger and more dilated than they should be Continue taking Trelegy 1 puff daily Continue to use a flutter valve 4 to 5 breaths, 4-5 times a day I am glad you have had a flu shot Practice good hand hygiene Stay active Call us if you have worsening chest congestion or mucus production.  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 that you are having an allergic reaction to a mold in your airways The dexamethasone you are taking is controlling this If you have a change in symptoms (chest congestion or mucus production) then call us right away to let us know  Pulmonary hypertension: This term means that the blood pressure in your lungs is higher than it should be Continue using oxygen continuously  Multiple myeloma, Continue taking Velcade and dexamethasone as directed by the hematology oncology clinic  Headache/neck pain:  Take ibuprofen 436m by mouth as needed for this pain However, if the pain worsens be sure to let the heme-onc doctors know  Follow-up with me in February or sooner if needed

## 2018-08-20 ENCOUNTER — Other Ambulatory Visit: Payer: Self-pay

## 2018-08-20 ENCOUNTER — Inpatient Hospital Stay: Payer: Medicare Other

## 2018-08-20 ENCOUNTER — Other Ambulatory Visit: Payer: Self-pay | Admitting: Hematology

## 2018-08-20 VITALS — BP 117/71 | HR 69 | Temp 98.1°F | Resp 16

## 2018-08-20 DIAGNOSIS — C9000 Multiple myeloma not having achieved remission: Secondary | ICD-10-CM

## 2018-08-20 DIAGNOSIS — Z5112 Encounter for antineoplastic immunotherapy: Secondary | ICD-10-CM | POA: Diagnosis not present

## 2018-08-20 DIAGNOSIS — K219 Gastro-esophageal reflux disease without esophagitis: Secondary | ICD-10-CM

## 2018-08-20 LAB — CMP (CANCER CENTER ONLY)
ALT: 8 U/L (ref 0–44)
AST: 14 U/L — AB (ref 15–41)
Albumin: 2.6 g/dL — ABNORMAL LOW (ref 3.5–5.0)
Alkaline Phosphatase: 65 U/L (ref 38–126)
Anion gap: 7 (ref 5–15)
BUN: 9 mg/dL (ref 6–20)
CHLORIDE: 104 mmol/L (ref 98–111)
CO2: 29 mmol/L (ref 22–32)
Calcium: 8.8 mg/dL — ABNORMAL LOW (ref 8.9–10.3)
Creatinine: 0.78 mg/dL (ref 0.61–1.24)
GFR, Est AFR Am: >60 mL/min (ref >60)
Glucose, Bld: 82 mg/dL (ref 70–99)
Potassium: 4.2 mmol/L (ref 3.5–5.1)
Sodium: 140 mmol/L (ref 135–145)
Total Bilirubin: <0.2 mg/dL — ABNORMAL LOW (ref 0.3–1.2)
Total Protein: 6.4 g/dL — ABNORMAL LOW (ref 6.5–8.1)

## 2018-08-20 LAB — CBC WITH DIFFERENTIAL/PLATELET
Abs Immature Granulocytes: 0.02 10*3/uL (ref 0.00–0.07)
BASOS ABS: 0.1 10*3/uL (ref 0.0–0.1)
Basophils Relative: 1 %
EOS ABS: 0.3 10*3/uL (ref 0.0–0.5)
EOS PCT: 3 %
HCT: 32.9 % — ABNORMAL LOW (ref 39.0–52.0)
Hemoglobin: 10.4 g/dL — ABNORMAL LOW (ref 13.0–17.0)
Immature Granulocytes: 0 %
LYMPHS ABS: 0.8 10*3/uL (ref 0.7–4.0)
Lymphocytes Relative: 10 %
MCH: 23.8 pg — ABNORMAL LOW (ref 26.0–34.0)
MCHC: 31.6 g/dL (ref 30.0–36.0)
MCV: 75.3 fL — AB (ref 80.0–100.0)
Monocytes Absolute: 0.8 10*3/uL (ref 0.1–1.0)
Monocytes Relative: 10 %
NRBC: 0 % (ref 0.0–0.2)
Neutro Abs: 6.1 10*3/uL (ref 1.7–7.7)
Neutrophils Relative %: 76 %
PLATELETS: 393 10*3/uL (ref 150–400)
RBC: 4.37 MIL/uL (ref 4.22–5.81)
RDW: 16.3 % — AB (ref 11.5–15.5)
WBC: 8 10*3/uL (ref 4.0–10.5)

## 2018-08-20 MED ORDER — NORTRIPTYLINE HCL 25 MG PO CAPS: 25.0000 mg | ORAL_CAPSULE | Freq: Every day | ORAL | 3 refills | Status: DC

## 2018-08-20 MED ORDER — PROCHLORPERAZINE MALEATE 10 MG PO TABS
10.0000 mg | ORAL_TABLET | Freq: Once | ORAL | Status: AC
  Administered 2018-08-20: 10 mg via ORAL

## 2018-08-20 MED ORDER — BORTEZOMIB CHEMO SQ INJECTION 3.5 MG (2.5MG/ML)
1.3000 mg/m2 | Freq: Once | INTRAMUSCULAR | Status: AC
  Administered 2018-08-20: 1.75 mg via SUBCUTANEOUS
  Filled 2018-08-20: qty 0.7

## 2018-08-20 MED ORDER — PROCHLORPERAZINE MALEATE 10 MG PO TABS
ORAL_TABLET | ORAL | Status: AC
  Filled 2018-08-20: qty 1

## 2018-08-20 MED ORDER — OMEPRAZOLE 40 MG PO CPDR: 40.0000 mg | DELAYED_RELEASE_CAPSULE | Freq: Every day | ORAL | 2 refills | Status: DC

## 2018-08-20 MED ORDER — ACYCLOVIR 400 MG PO TABS: 400.0000 mg | ORAL_TABLET | Freq: Every day | ORAL | 3 refills | Status: DC

## 2018-08-20 NOTE — Patient Instructions (Signed)
[facility] Discharge Instructions for Patients Receiving Chemotherapy  Today you received the following chemotherapy agents bortezomib (Velcade)  To help prevent nausea and vomiting after your treatment, we encourage you to take your nausea medication as directed.   If you develop nausea and vomiting that is not controlled by your nausea medication, call the clinic.   BELOW ARE SYMPTOMS THAT SHOULD BE REPORTED IMMEDIATELY:  *FEVER GREATER THAN 100.5 F  *CHILLS WITH OR WITHOUT FEVER  NAUSEA AND VOMITING THAT IS NOT CONTROLLED WITH YOUR NAUSEA MEDICATION  *UNUSUAL SHORTNESS OF BREATH  *UNUSUAL BRUISING OR BLEEDING  TENDERNESS IN MOUTH AND THROAT WITH OR WITHOUT PRESENCE OF ULCERS  *URINARY PROBLEMS  *BOWEL PROBLEMS  UNUSUAL RASH Items with * indicate a potential emergency and should be followed up as soon as possible.  Feel free to call the clinic should you have any questions or concerns. The clinic phone number is [phone].  Please show the CHEMO ALERT CARD at check-in to the Emergency Department and triage nurse.   

## 2018-08-21 MED FILL — ACYCLOVIR 400 MG TABLET: 400 | 30 days supply | Qty: 30 | Fill #0

## 2018-08-21 MED FILL — NORTRIPTYLINE HCL CAP 25 MG: 25 | 30 days supply | Qty: 30 | Fill #0

## 2018-08-21 MED FILL — OMEPRAZOLE 40 MG CPDR: 40 | 30 days supply | Qty: 30 | Fill #0

## 2018-08-24 ENCOUNTER — Telehealth: Payer: Self-pay

## 2018-08-24 NOTE — Telephone Encounter (Signed)
Called pt phone and spoke with pt girlfriend. Requested to speak with pt regarding pain, but aware that he needs translator. Number for translator acquired [phone].

## 2018-08-25 ENCOUNTER — Telehealth: Payer: Self-pay

## 2018-08-25 NOTE — Telephone Encounter (Signed)
Called pt interpreter as directed by pt girlfriend at [phone]. Individual confused by request to translate for pt. Unable to acquire name. Appt note placed for pain to be discussed with pt at visit on 08/27/18 in infusion via translator and relay information to Dr. Irene Limbo or his nurse.

## 2018-08-27 ENCOUNTER — Inpatient Hospital Stay: Payer: Medicare Other

## 2018-08-27 ENCOUNTER — Other Ambulatory Visit: Payer: Self-pay | Admitting: Hematology

## 2018-08-27 DIAGNOSIS — C9000 Multiple myeloma not having achieved remission: Secondary | ICD-10-CM

## 2018-08-27 DIAGNOSIS — Z5112 Encounter for antineoplastic immunotherapy: Secondary | ICD-10-CM | POA: Diagnosis not present

## 2018-08-27 LAB — CMP (CANCER CENTER ONLY)
ALBUMIN: 2.9 g/dL — AB (ref 3.5–5.0)
ALK PHOS: 78 U/L (ref 38–126)
ALT: 13 U/L (ref 0–44)
ANION GAP: 11 (ref 5–15)
AST: 15 U/L (ref 15–41)
BUN: 17 mg/dL (ref 6–20)
CALCIUM: 9.6 mg/dL (ref 8.9–10.3)
CO2: 29 mmol/L (ref 22–32)
Chloride: 97 mmol/L — ABNORMAL LOW (ref 98–111)
Creatinine: 0.88 mg/dL (ref 0.61–1.24)
GFR, Estimated: >60 mL/min (ref >60)
GLUCOSE: 104 mg/dL — AB (ref 70–99)
Potassium: 4.1 mmol/L (ref 3.5–5.1)
SODIUM: 137 mmol/L (ref 135–145)
Total Bilirubin: 0.3 mg/dL (ref 0.3–1.2)
Total Protein: 7.5 g/dL (ref 6.5–8.1)

## 2018-08-27 LAB — CBC WITH DIFFERENTIAL/PLATELET
ABS IMMATURE GRANULOCYTES: 0.07 10*3/uL (ref 0.00–0.07)
BASOS ABS: 0.1 10*3/uL (ref 0.0–0.1)
Basophils Relative: 1 %
EOS ABS: 0.3 10*3/uL (ref 0.0–0.5)
Eosinophils Relative: 3 %
HEMATOCRIT: 37.4 % — AB (ref 39.0–52.0)
HEMOGLOBIN: 11.9 g/dL — AB (ref 13.0–17.0)
IMMATURE GRANULOCYTES: 1 %
LYMPHS ABS: 0.8 10*3/uL (ref 0.7–4.0)
LYMPHS PCT: 7 %
MCH: 24.1 pg — ABNORMAL LOW (ref 26.0–34.0)
MCHC: 31.8 g/dL (ref 30.0–36.0)
MCV: 75.9 fL — AB (ref 80.0–100.0)
MONOS PCT: 16 %
Monocytes Absolute: 1.7 10*3/uL — ABNORMAL HIGH (ref 0.1–1.0)
NEUTROS ABS: 8.1 10*3/uL — AB (ref 1.7–7.7)
NEUTROS PCT: 72 %
NRBC: 0.2 % (ref 0.0–0.2)
Platelets: 291 10*3/uL (ref 150–400)
RBC: 4.93 MIL/uL (ref 4.22–5.81)
RDW: 16.4 % — ABNORMAL HIGH (ref 11.5–15.5)
WBC: 11.1 10*3/uL — ABNORMAL HIGH (ref 4.0–10.5)

## 2018-08-27 MED ORDER — BORTEZOMIB CHEMO SQ INJECTION 3.5 MG (2.5MG/ML)
1.3000 mg/m2 | Freq: Once | INTRAMUSCULAR | Status: AC
  Administered 2018-08-27: 1.75 mg via SUBCUTANEOUS
  Filled 2018-08-27: qty 0.7

## 2018-08-27 MED ORDER — PROCHLORPERAZINE MALEATE 10 MG PO TABS
10.0000 mg | ORAL_TABLET | Freq: Once | ORAL | Status: AC
  Administered 2018-08-27: 10 mg via ORAL

## 2018-08-27 MED ORDER — ONDANSETRON HCL 8 MG PO TABS: 8.0000 mg | ORAL_TABLET | Freq: Three times a day (TID) | ORAL | 3 refills | Status: DC | PRN

## 2018-08-27 MED ORDER — OXYCODONE-ACETAMINOPHEN 5-325 MG PO TABS: 1.0000 | ORAL_TABLET | Freq: Four times a day (QID) | ORAL | 0 refills | Status: DC | PRN

## 2018-08-27 MED ORDER — PROCHLORPERAZINE MALEATE 10 MG PO TABS
ORAL_TABLET | ORAL | Status: AC
  Filled 2018-08-27: qty 1

## 2018-08-28 ENCOUNTER — Telehealth: Payer: Self-pay | Admitting: *Deleted

## 2018-08-28 MED FILL — ONDANSETRON HCL 8 MG TABLET: 8 | 10 days supply | Qty: 30 | Fill #0

## 2018-08-28 MED FILL — OXYCODONE-ACETAMINOPHEN 5-3: 5-325 | 7 days supply | Qty: 30 | Fill #0

## 2018-08-28 NOTE — Telephone Encounter (Signed)
Notified patient at mobile number that Dr. Irene Limbo sent prescriptions for nausea and pain to pharmacy. Verbalized understanding.

## 2018-08-31 LAB — KAPPA/LAMBDA LIGHT CHAINS
KAPPA, LAMDA LIGHT CHAIN RATIO: 1.68 — AB (ref 0.26–1.65)
Kappa free light chain: 37.2 mg/L — ABNORMAL HIGH (ref 3.3–19.4)
Lambda free light chains: 22.2 mg/L (ref 5.7–26.3)

## 2018-09-01 ENCOUNTER — Ambulatory Visit (HOSPITAL_COMMUNITY)
Admission: RE | Admit: 2018-09-01 | Discharge: 2018-09-01 | Disposition: A | Discharge status: Home / Self Care | Payer: Medicare Other | Source: Ambulatory Visit | Attending: Interventional Radiology | Admitting: Interventional Radiology

## 2018-09-01 ENCOUNTER — Encounter: Payer: Self-pay | Admitting: Radiology

## 2018-09-01 ENCOUNTER — Other Ambulatory Visit: Payer: Self-pay | Admitting: *Deleted

## 2018-09-01 ENCOUNTER — Ambulatory Visit
Admission: RE | Admit: 2018-09-01 | Discharge: 2018-09-01 | Disposition: A | Discharge status: Home / Self Care | Payer: Self-pay | Source: Ambulatory Visit | Attending: Interventional Radiology | Admitting: Interventional Radiology

## 2018-09-01 DIAGNOSIS — J189 Pneumonia, unspecified organism: Secondary | ICD-10-CM | POA: Diagnosis not present

## 2018-09-01 DIAGNOSIS — C9000 Multiple myeloma not having achieved remission: Secondary | ICD-10-CM | POA: Insufficient documentation

## 2018-09-01 DIAGNOSIS — R0602 Shortness of breath: Secondary | ICD-10-CM | POA: Diagnosis not present

## 2018-09-01 DIAGNOSIS — M5136 Other intervertebral disc degeneration, lumbar region: Secondary | ICD-10-CM

## 2018-09-01 HISTORY — PX: IR RADIOLOGIST EVAL & MGMT: IMG5224

## 2018-09-01 LAB — MULTIPLE MYELOMA PANEL, SERUM
Albumin SerPl Elph-Mcnc: 2.8 g/dL — ABNORMAL LOW (ref 2.9–4.4)
Albumin/Glob SerPl: 0.8 (ref 0.7–1.7)
Alpha 1: 0.4 g/dL (ref 0.0–0.4)
Alpha2 Glob SerPl Elph-Mcnc: 1.1 g/dL — ABNORMAL HIGH (ref 0.4–1.0)
B-Globulin SerPl Elph-Mcnc: 1.1 g/dL (ref 0.7–1.3)
Gamma Glob SerPl Elph-Mcnc: 1.4 g/dL (ref 0.4–1.8)
Globulin, Total: 4 g/dL — ABNORMAL HIGH (ref 2.2–3.9)
IGA: 113 mg/dL (ref 90–386)
IgG (Immunoglobin G), Serum: 1472 mg/dL (ref 700–1600)
IgM (Immunoglobulin M), Srm: 45 mg/dL (ref 20–172)
M Protein SerPl Elph-Mcnc: 0.9 g/dL — ABNORMAL HIGH
TOTAL PROTEIN ELP: 6.8 g/dL (ref 6.0–8.5)

## 2018-09-01 MED ORDER — GADOBUTROL 1 MMOL/ML IV SOLN
4.0000 mL | Freq: Once | INTRAVENOUS | Status: AC | PRN
  Administered 2018-09-01: 4 mL via INTRAVENOUS

## 2018-09-01 MED ORDER — LENALIDOMIDE 15 MG PO CAPS: 15.0000 mg | ORAL_CAPSULE | Freq: Every day | ORAL | 0 refills | Status: DC

## 2018-09-01 NOTE — Progress Notes (Signed)
Chief Complaint: F/U possible lytic lesion  Supervising Physician: Jacqulynn Cadet  History of Present Illness: [name] [name] is a 57 y.o. male who initially seen by Dr. Laurence Ferrari on 05/27/18 for possible lytic lesion at L4.  Patient has known myeloma and is under the care of Dr. Irene Limbo.  He reports some pain at L4 with palpation and he states he gets fatigued when he walks mostly because he becomes short of breath. He also c/o cough.  He has known chronic lung disease which is being treated by Dr. Lake Bells.   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 No definite myeloma.  Past Medical History:  Diagnosis Date  . Acute on chronic respiratory failure ([place]) 02/07/2018  . Hypertension   . Kidney stones   . Pulmonary hypertension ([place])     Past Surgical History:  Procedure Laterality Date  . CARDIAC CATHETERIZATION N/A 01/18/2016   Procedure: Right Heart Cath;  Surgeon: Jolaine Artist, MD;  Location: [facility];  Service: Cardiovascular;  Laterality: N/A;  . CARDIAC CATHETERIZATION N/A 06/24/2016   Procedure: Right Heart Cath;  Surgeon: Jolaine Artist, MD;  Location: [facility];  Service: Cardiovascular;  Laterality: N/A;  . IR RADIOLOGIST EVAL & MGMT  05/27/2018  . KIDNEY SURGERY    . VIDEO ASSISTED THORACOSCOPY (VATS)/DECORTICATION Left 2003  . VIDEO ASSISTED THORACOSCOPY (VATS)/DECORTICATION Left 10/02/2015   Procedure: VIDEO ASSISTED THORACOSCOPY (VATS)/DECORTICATION and drainage of chronic empyena;  Surgeon: [name], MD;  Location: [facility];  Service: Thoracic;  Laterality: Left;  Marland Kitchen VIDEO BRONCHOSCOPY N/A 10/02/2015   Procedure: VIDEO BRONCHOSCOPY;  Surgeon: [name], MD;  Location: [facility];  Service: Thoracic;  Laterality: N/A;    Allergies: Patient has no known allergies.  Medications: Prior to Admission medications   Medication Sig Start Date End Date Taking? Authorizing Provider    acyclovir (ZOVIRAX) 400 MG tablet Take 1 tablet (400 mg total) by mouth daily. 08/20/18  Yes Brunetta Genera, MD  albuterol (PROVENTIL HFA;VENTOLIN HFA) 108 (90 Base) MCG/ACT inhaler Inhale 2 puffs into the lungs every 6 (six) hours as needed for wheezing or shortness of breath. 07/31/18  Yes Tanda Rockers, MD  dexamethasone (DECADRON) 4 MG tablet TAKE 5 TABLETS (20 MG TOTAL) BY MOUTH ONCE A WEEK. 08/06/18  Yes Brunetta Genera, MD  docusate sodium (COLACE) 50 MG capsule Take 1 capsule (50 mg total) by mouth daily. 03/06/18  Yes [name], PA-C  nortriptyline (PAMELOR) 25 MG capsule Take 1 capsule (25 mg total) by mouth at bedtime. 08/20/18  Yes Brunetta Genera, MD  omeprazole (PRILOSEC) 40 MG capsule Take 1 capsule (40 mg total) by mouth daily. 08/20/18  Yes Brunetta Genera, MD  oxyCODONE-acetaminophen (PERCOCET/ROXICET) 5-325 MG tablet Take 1 tablet by mouth every 6 (six) hours as needed for severe pain. 08/27/18  Yes Brunetta Genera, MD  OXYGEN 2lpm BEDTIME ONLY   Yes [provider]  umeclidinium-vilanterol (ANORO ELLIPTA) 62.5-25 MCG/INH AEPB Inhale 1 puff into the lungs daily. 03/05/18  Yes [name], PA-C  aspirin EC 81 MG tablet Take 1 tablet (81 mg total) by mouth daily. Patient not taking: Reported on 09/01/2018 08/06/18   Brunetta Genera, MD  Fluticasone-Umeclidin-Vilant (TRELEGY ELLIPTA) 100-62.5-25 MCG/INH AEPB Inhale 1 puff into the lungs daily. Patient not taking: Reported on 09/01/2018 07/31/18   Tanda Rockers, MD  lenalidomide (REVLIMID) 15 MG capsule  Take 1 capsule (15 mg total) by mouth daily. Authorization #[id number] 08/06/18 Patient not taking: Reported on 09/01/2018 08/06/18   Brunetta Genera, MD  nortriptyline (PAMELOR) 25 MG capsule Take 1 capsule (25 mg total) by mouth at bedtime. 03/16/18 04/15/18  Ardath Sax, MD  ondansetron (ZOFRAN) 8 MG tablet Take 1 tablet (8 mg total) by mouth every 8 (eight) hours as needed  for nausea. Patient not taking: Reported on 09/01/2018 08/27/18   Brunetta Genera, MD  potassium chloride SA (K-DUR,KLOR-CON) 20 MEQ tablet Take 1 tablet (20 mEq total) by mouth 2 (two) times daily. Patient not taking: Reported on 09/01/2018 08/06/18   Brunetta Genera, MD     Family History  Problem Relation Age of Onset  . Hypertension Other     Social History   Socioeconomic History  . Marital status: Single    Spouse name: Not on file  . Number of children: Not on file  . Years of education: Not on file  . Highest education level: Not on file  Occupational History  . Not on file  Social Needs  . Financial resource strain: Not on file  . Food insecurity:    Worry: Not on file    Inability: Not on file  . Transportation needs:    Medical: Not on file    Non-medical: Not on file  Tobacco Use  . Smoking status: Never Smoker  . Smokeless tobacco: Never Used  Substance and Sexual Activity  . Alcohol use: Yes  . Drug use: No  . Sexual activity: Not on file  Lifestyle  . Physical activity:    Days per week: Not on file    Minutes per session: Not on file  . Stress: Not on file  Relationships  . Social connections:    Talks on phone: Not on file    Gets together: Not on file    Attends religious service: Not on file    Active member of club or organization: Not on file    Attends meetings of clubs or organizations: Not on file    Relationship status: Not on file  Other Topics Concern  . Not on file  Social History Narrative  . Not on file     Review of Systems: A 12 point ROS discussed and pertinent positives are indicated in the HPI above.  All other systems are negative.  Review of Systems  Vital Signs: Ht _0  (1.651 m)   Wt 35.8 kg   BMI 13.13 kg/m   Physical Exam  Constitutional: He is oriented to person, place, and time.  HENT:  Head: Normocephalic and atraumatic.  Neck: Normal range of motion.  Cardiovascular: Normal rate and regular  rhythm.  Pulmonary/Chest: Effort normal. No respiratory distress.  Breath sounds diminished bilateral bases L>R Scattered rhonchi bilaterally  Musculoskeletal: Normal range of motion.       Back:  Some tenderness to firm palpation.  Neurological: He is alert and oriented to person, place, and time.  Skin: Skin is warm and dry.  Psychiatric: He has a normal mood and affect. His behavior is normal. Judgment and thought content normal.  Vitals reviewed.     Imaging: Mr Lumbar Spine W Wo Contrast  Result Date: 09/01/2018 CLINICAL DATA:  Multiple myeloma.  Evaluate L4 lytic lesion. EXAM: MRI LUMBAR SPINE WITHOUT AND WITH CONTRAST TECHNIQUE: Multiplanar and multiecho pulse sequences of the lumbar spine were obtained without and with intravenous contrast. CONTRAST:  4 cc Gadavist intravenous  COMPARISON:  Head CT 10/21/2017.  Abdominal CT 01/23/2018 FINDINGS: Segmentation:  5 lumbar type vertebral bodies Alignment: Facet mediated degenerative anterolisthesis at L4-5, grade 1 Vertebrae: L4 body lucent area on prior CT is readily identified and associated with a defect in the L4 superior endplate with visible downward herniation of disc material on sagittal STIR imaging. There is reactive marrow fatty change. Similar findings affect the L3 inferior endplate to a lesser degree. No evident progression since CT 01/23/2018, the L4 finding report measures 14 mm. There is mild peripheral enhancement at the level of L4 "lesion", nonspecific. No aggressive bone lesion. No acute fracture. Conus medullaris and cauda equina: Conus extends to the L1-2 level. Conus and cauda equina appear normal. There is minimal fat deposition within the filum terminalis. Paraspinal and other soft tissues: Small renal cystic intensities. Disc levels: T12- L1: Unremarkable. L1-L2: Mild disc narrowing.  Anterior annular fissure. L2-L3: Mild disc bulging.  No impingement L3-L4: Disc narrowing and bulging with moderate bilateral foraminal  narrowing. Patent spinal canal L4-L5: Advanced facet arthropathy with spurring and anterolisthesis. The disc is narrowed and bulging with posterior annular fissure. Moderate to advanced spinal stenosis. L5 compression in the bilateral subarticular recesses. Bilateral L4 foraminal impingement L5-S1:Posterior annular fissures and mild disc bulging. Negative facets. IMPRESSION: 1. 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 No definite myeloma. 2. Degenerative disease causes canal and biforaminal impingement at L4-5. Electronically Signed   By: Monte Fantasia M.D.   On: 09/01/2018 13:38    Labs:  CBC: Recent Labs    08/06/18 1215 08/14/18 1306 08/20/18 1315 08/27/18 1254  WBC 9.4 8.9 8.0 11.1*  HGB 11.4* 11.2* 10.4* 11.9*  HCT 36.1* 35.0* 32.9* 37.4*  PLT 367 577* 393 291    COAGS: Recent Labs    11/03/17 0728 01/02/18 0705 01/26/18 1735 03/18/18 0728  INR 1.05 1.00 1.18 1.02  APTT 32  --  36  --     BMP: Recent Labs    08/06/18 1215 08/14/18 1306 08/20/18 1315 08/27/18 1254  NA 141 136 140 137  K 3.0* 4.6 4.2 4.1  CL 105 98 104 97*  CO2 _0 GLUCOSE 96 92 82 104*  BUN _1 CALCIUM 8.3* 9.2 8.8* 9.6  CREATININE 0.80 0.94 0.78 0.88  GFRNONAA >60 >60 >60 >60  GFRAA >60 >60 >60 >60    LIVER FUNCTION TESTS: Recent Labs    08/06/18 1215 08/14/18 1306 08/20/18 1315 08/27/18 1254  BILITOT <0.2* 0.3 <0.2* 0.3  AST 15 12* 14* 15  ALT _2 ALKPHOS 68 71 65 78  PROT 7.2 7.3 6.4* 7.5  ALBUMIN 2.9* 2.8* 2.6* 2.9*    TUMOR MARKERS: No results for input(s): AFPTM, CEA, CA199, CHROMGRNA in the last 8760 hours.  Assessment:  Multiple myeloma = Managed by Dr. 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 No definite myeloma.  No need for any IR procedure at this time. Return here PRN.  Chronic lung disease = managed by Dr. Lake Bells.  Electronically Signed: Murrell Redden  PA-C 09/01/2018, 2:00 PM    Please refer to Dr. Katrinka Blazing attestation of this note for management and plan.

## 2018-09-02 NOTE — Progress Notes (Signed)
HEMATOLOGY/ONCOLOGY CONSULTATION NOTE  Date of Service: 09/03/2018  Patient Care Team: [name], PA-C as PCP - General (Physician Assistant) Michel Bickers, MD as Consulting Physician (Infectious Diseases) Brand Males, MD as Consulting Physician (Pulmonary Disease)  CHIEF COMPLAINTS/PURPOSE OF CONSULTATION:  Multiple myeloma  Oncologic History:  57 y.o.  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  Based on the diagnosis, patient was started on treatment with bortezomib & low-dose dexamethasone.  Initially, lenalidomide was omitted out of concern for high risk of for infectious complications.  We have continued to therapy with just a doublet treatment and did not reintensify the therapy.  Treatment course has been complicated by exacerbation of pulmonary hypertension and now with shingles reactivation resulting in blistering rash and skin ulcerations on the left flank.  Infection appears to be responding to higher dose of acyclovir and skin is healing.   HISTORY OF PRESENTING ILLNESS:   [name] [name] is a wonderful 57 y.o. male who has been referred to [place] by my colleague Dr [name] for evaluation and management of Multiple myeloma. He is accompanied today by a Optometrist. The pt reports that he is doing well overall.   The pt has been followed by my colleague Dr [name] and has been treated with Velcade and dexamethasone.    The pt reports that he has lost some weight and hasn't eaten as much because he hasn't been able to taste his food as well.   He notes that his shingles rash has continued to clear up and he denies pain associated with his remaining rash. His back pain continues and he has run out of his Hydromorphone. He notes that his back  pain has not gotten better since he was first evaluated. He notes that he feels tired and has a little SOB.   Most recent lab results (04/07/18) of CBC and CMP is as follows: all values are WNL except for Sodium at 134, chloride at 96, Total Protein at 8.7, Albumin at 3.0, Total bilirubin <0.2, WBC at 19.1k, HGB at 10.7, HCT at 33.2, MCV at 73.5, MCH at 23.7, RDW at 18.4, Platelets at 637k, ANC at 17.1k.Marland Kitchen MMP 04/07/18 showed IgG elevated at 2301 and M Protein at 1.5. Ferritin 04/07/18 was elevated at 436 Iron/TIBC 04/07/18 showed at 12% saturation ratio LDH 04/07/18 is WNL at 171 Kappa/Lambda 04/07/18 showed Kappa at 25.2 and K:L ratio at 2.27  On review of systems, pt reports some SOB, back pain, resolving shingles rash, mouth sores, weight loss, and denies fevers, chills, new skin rashes, and any other symptoms.  Interval History:   [name] [name] returns today for management and evaluation of his multiple myeloma. The patient's last visit with [place] was on 08/06/18. He is accompanied today by a Optometrist. The pt reports that he is doing well overall.   The pt reports that he has felt more SOB for the last week and feels mucous in his throat. He is coughing up dark yellow phlegm. He endorses some chills over the last couple days as well, and notes that his symptoms are worse when he walks around. He denies checking his temperature at home. He denies eating well and feels that everything tastes bad.    The pt also notes that he is experiencing some pain in  the left side of his neck, under his ear. He notes that this is worse when he coughs, and feels that it radiates up from his lower back as well.   The pt has continued taking aspirin.  Of note since the patient's last visit, pt has had MRI Lumbar completed on 09/01/18 with results revealing L4 body abnormality is more consistent with Schmorl's node than myeloma. No definite myeloma. 2. Degenerative disease causes canal and biforaminal impingement at L4-5.  Lab  results today (09/02/18) of CBC w/diff, CMP is as follows: all values are WNL except for WBC at 12.5k, HGB at 11.0, HCT at 34.7, MCV at 74.9, MCH at 23.8, RDW at 16.2, ANC at 9.7k, Monocytes abs at 1.7k, Glucose at 166, Calcium at 8.5, Albumin at 2.5, AST at 12, Total Bilirubin at 0.2. 08/27/18 MMP revealed all values WNL except for Albumin at 2.8, Alpha 2 at 1.1, M Protein at 0.9, Globulin at 4.0 08/27/18 Kappa/lambda light chains revealed Kappa at 37.2 and K:L ratio at 1.68  On review of systems, pt reports SOB, dark yellow phlegm, neck pain radiating from lower back, chills, and denies eating well, any other symptoms.   MEDICAL HISTORY:  Past Medical History:  Diagnosis Date  . Acute on chronic respiratory failure ([place]) 02/07/2018  . Hypertension   . Kidney stones   . Pulmonary hypertension ([place])     SURGICAL HISTORY: Past Surgical History:  Procedure Laterality Date  . CARDIAC CATHETERIZATION N/A 01/18/2016   Procedure: Right Heart Cath;  Surgeon: Jolaine Artist, MD;  Location: [facility];  Service: Cardiovascular;  Laterality: N/A;  . CARDIAC CATHETERIZATION N/A 06/24/2016   Procedure: Right Heart Cath;  Surgeon: Jolaine Artist, MD;  Location: [facility];  Service: Cardiovascular;  Laterality: N/A;  . IR RADIOLOGIST EVAL & MGMT  05/27/2018  . IR RADIOLOGIST EVAL & MGMT  09/01/2018  . KIDNEY SURGERY    . VIDEO ASSISTED THORACOSCOPY (VATS)/DECORTICATION Left 2003  . VIDEO ASSISTED THORACOSCOPY (VATS)/DECORTICATION Left 10/02/2015   Procedure: VIDEO ASSISTED THORACOSCOPY (VATS)/DECORTICATION and drainage of chronic empyena;  Surgeon: [name], MD;  Location: [facility];  Service: Thoracic;  Laterality: Left;  Marland Kitchen VIDEO BRONCHOSCOPY N/A 10/02/2015   Procedure: VIDEO BRONCHOSCOPY;  Surgeon: [name], MD;  Location: [facility];  Service: Thoracic;  Laterality: N/A;    SOCIAL HISTORY: Social History   Socioeconomic History  . Marital status: Single     Spouse name: Not on file  . Number of children: Not on file  . Years of education: Not on file  . Highest education level: Not on file  Occupational History  . Not on file  Social Needs  . Financial resource strain: Not on file  . Food insecurity:    Worry: Not on file    Inability: Not on file  . Transportation needs:    Medical: Not on file    Non-medical: Not on file  Tobacco Use  . Smoking status: Never Smoker  . Smokeless tobacco: Never Used  Substance and Sexual Activity  . Alcohol use: Yes  . Drug use: No  . Sexual activity: Not on file  Lifestyle  . Physical activity:    Days per week: Not on file    Minutes per session: Not on file  . Stress: Not on file  Relationships  . Social connections:    Talks on phone: Not on file    Gets together: Not on file  Attends religious service: Not on file    Active member of club or organization: Not on file    Attends meetings of clubs or organizations: Not on file    Relationship status: Not on file  . Intimate partner violence:    Fear of current or ex partner: Not on file    Emotionally abused: Not on file    Physically abused: Not on file    Forced sexual activity: Not on file  Other Topics Concern  . Not on file  Social History Narrative  . Not on file    FAMILY HISTORY: Family History  Problem Relation Age of Onset  . Hypertension Other     ALLERGIES:  has No Known Allergies.  MEDICATIONS:  Current Outpatient Medications  Medication Sig Dispense Refill  . acyclovir (ZOVIRAX) 400 MG tablet Take 1 tablet (400 mg total) by mouth daily. 30 tablet 3  . albuterol (PROVENTIL HFA;VENTOLIN HFA) 108 (90 Base) MCG/ACT inhaler Inhale 2 puffs into the lungs every 6 (six) hours as needed for wheezing or shortness of breath. 1 Inhaler 2  . aspirin EC 81 MG tablet Take 1 tablet (81 mg total) by mouth daily. (Patient not taking: Reported on 09/01/2018)    . dexamethasone (DECADRON) 4 MG tablet TAKE 5 TABLETS (20 MG TOTAL)  BY MOUTH ONCE A WEEK. 20 tablet 3  . docusate sodium (COLACE) 50 MG capsule Take 1 capsule (50 mg total) by mouth daily. 30 capsule 0  . Fluticasone-Umeclidin-Vilant (TRELEGY ELLIPTA) 100-62.5-25 MCG/INH AEPB Inhale 1 puff into the lungs daily. (Patient not taking: Reported on 09/01/2018) 1 each 5  . lenalidomide (REVLIMID) 15 MG capsule Take 1 capsule (15 mg total) by mouth daily. Authorization#[id number] 09/01/18 21 capsule 0  . nortriptyline (PAMELOR) 25 MG capsule Take 1 capsule (25 mg total) by mouth at bedtime. 30 capsule 3  . nortriptyline (PAMELOR) 25 MG capsule Take 1 capsule (25 mg total) by mouth at bedtime. 30 capsule 3  . omeprazole (PRILOSEC) 40 MG capsule Take 1 capsule (40 mg total) by mouth daily. 30 capsule 2  . ondansetron (ZOFRAN) 8 MG tablet Take 1 tablet (8 mg total) by mouth every 8 (eight) hours as needed for nausea. (Patient not taking: Reported on 09/01/2018) 30 tablet 3  . oxyCODONE-acetaminophen (PERCOCET/ROXICET) 5-325 MG tablet Take 1 tablet by mouth every 6 (six) hours as needed for severe pain. 30 tablet 0  . OXYGEN 2lpm BEDTIME ONLY    . potassium chloride SA (K-DUR,KLOR-CON) 20 MEQ tablet Take 1 tablet (20 mEq total) by mouth 2 (two) times daily. (Patient not taking: Reported on 09/01/2018) 60 tablet 1  . umeclidinium-vilanterol (ANORO ELLIPTA) 62.5-25 MCG/INH AEPB Inhale 1 puff into the lungs daily. 60 each 5   No current facility-administered medications for this visit.     REVIEW OF SYSTEMS:    A 10+ POINT REVIEW OF SYSTEMS WAS OBTAINED including neurology, dermatology, psychiatry, cardiac, respiratory, lymph, extremities, GI, GU, Musculoskeletal, constitutional, breasts, reproductive, HEENT.  All pertinent positives are noted in the HPI.  All others are negative.   PHYSICAL EXAMINATION: ECOG PERFORMANCE STATUS: 2 - Symptomatic, <50% confined to bed  Vitals:   09/03/18 1133  BP: 120/83  Pulse: (!) 103  Resp: 16  Temp: 98.5 F (36.9 C)  SpO2: 92%    Filed Weights   09/03/18 1133  Weight: 80 lb 11.2 oz (36.6 kg)   .Body mass index is 13.43 kg/m.  GENERAL:alert, in no acute distress and comfortable SKIN: no acute  rashes, no significant lesions EYES: conjunctiva are pink and non-injected, sclera anicteric OROPHARYNX: MMM, no exudates, no oropharyngeal erythema or ulceration NECK: supple, no JVD LYMPH:  no palpable lymphadenopathy in the cervical, axillary or inguinal regions LUNGS: Decreased breath sounds on left side with scattered crackles, and scattered rhonchi on right side   HEART: regular rate & rhythm ABDOMEN:  normoactive bowel sounds , non tender, not distended. No palpable hepatosplenomegaly.  Extremity: no pedal edema PSYCH: alert & oriented x 3 with fluent speech NEURO: no focal motor/sensory deficits  =  LABORATORY DATA:  I have reviewed the data as listed  . CBC Latest Ref Rng & Units 09/03/2018 08/27/2018 08/20/2018  WBC 4.0 - 10.5 K/uL 12.5(H) 11.1(H) 8.0  Hemoglobin 13.0 - 17.0 g/dL 11.0(L) 11.9(L) 10.4(L)  Hematocrit 39.0 - 52.0 % 34.7(L) 37.4(L) 32.9(L)  Platelets 150 - 400 K/uL 338 291 393   . CBC    Component Value Date/Time   WBC 12.5 (H) 09/03/2018 1033   RBC 4.63 09/03/2018 1033   HGB 11.0 (L) 09/03/2018 1033   HGB 10.7 (L) 04/07/2018 1429   HGB 9.5 (L) 03/05/2018 1206   HGB 13.1 11/07/2017 1014   HCT 34.7 (L) 09/03/2018 1033   HCT 32.3 (L) 03/05/2018 1206   HCT 39.7 11/07/2017 1014   PLT 338 09/03/2018 1033   PLT 637 (H) 04/07/2018 1429   PLT 474 (H) 03/05/2018 1206   MCV 74.9 (L) 09/03/2018 1033   MCV 82 03/05/2018 1206   MCV 87.6 11/07/2017 1014   MCH 23.8 (L) 09/03/2018 1033   MCHC 31.7 09/03/2018 1033   RDW 16.2 (H) 09/03/2018 1033   RDW 20.5 (H) 03/05/2018 1206   RDW 14.5 11/07/2017 1014   LYMPHSABS 1.0 09/03/2018 1033   LYMPHSABS 1.5 03/05/2018 1206   LYMPHSABS 1.7 11/07/2017 1014   MONOABS 1.7 (H) 09/03/2018 1033   MONOABS 0.8 11/07/2017 1014   EOSABS 0.0 09/03/2018 1033    EOSABS 0.4 03/05/2018 1206   BASOSABS 0.1 09/03/2018 1033   BASOSABS 0.1 03/05/2018 1206   BASOSABS 0.0 11/07/2017 1014    . CMP Latest Ref Rng & Units 09/03/2018 08/27/2018 08/20/2018  Glucose 70 - 99 mg/dL 166(H) 104(H) 82  BUN 6 - 20 mg/dL '17 17 9  ' Creatinine 0.61 - 1.24 mg/dL 0.92 0.88 0.78  Sodium 135 - 145 mmol/L 137 137 140  Potassium 3.5 - 5.1 mmol/L 3.7 4.1 4.2  Chloride 98 - 111 mmol/L 102 97(L) 104  CO2 22 - 32 mmol/L '27 29 29  ' Calcium 8.9 - 10.3 mg/dL 8.5(L) 9.6 8.8(L)  Total Protein 6.5 - 8.1 g/dL 7.0 7.5 6.4(L)  Total Bilirubin 0.3 - 1.2 mg/dL 0.2(L) 0.3 <0.2(L)  Alkaline Phos 38 - 126 U/L 75 78 65  AST 15 - 41 U/L 12(L) 15 14(L)  ALT 0 - 44 U/L '11 13 8   ' 03/18/18 Cytogenetics:  03/18/18 Pathology:  11/03/17 Cytogenetics:    RADIOGRAPHIC STUDIES: I have personally reviewed the radiological images as listed and agreed with the findings in the report. Mr Lumbar Spine W Wo Contrast  Result Date: 09/01/2018 CLINICAL DATA:  Multiple myeloma.  Evaluate L4 lytic lesion. EXAM: MRI LUMBAR SPINE WITHOUT AND WITH CONTRAST TECHNIQUE: Multiplanar and multiecho pulse sequences of the lumbar spine were obtained without and with intravenous contrast. CONTRAST:  4 cc Gadavist intravenous COMPARISON:  Head CT 10/21/2017.  Abdominal CT 01/23/2018 FINDINGS: Segmentation:  5 lumbar type vertebral bodies Alignment: Facet mediated degenerative anterolisthesis at L4-5, grade 1 Vertebrae: L4  body lucent area on prior CT is readily identified and associated with a defect in the L4 superior endplate with visible downward herniation of disc material on sagittal STIR imaging. There is reactive marrow fatty change. Similar findings affect the L3 inferior endplate to a lesser degree. No evident progression since CT 01/23/2018, the L4 finding report measures 14 mm. There is mild peripheral enhancement at the level of L4 "lesion", nonspecific. No aggressive bone lesion. No acute fracture. Conus  medullaris and cauda equina: Conus extends to the L1-2 level. Conus and cauda equina appear normal. There is minimal fat deposition within the filum terminalis. Paraspinal and other soft tissues: Small renal cystic intensities. Disc levels: T12- L1: Unremarkable. L1-L2: Mild disc narrowing.  Anterior annular fissure. L2-L3: Mild disc bulging.  No impingement L3-L4: Disc narrowing and bulging with moderate bilateral foraminal narrowing. Patent spinal canal L4-L5: Advanced facet arthropathy with spurring and anterolisthesis. The disc is narrowed and bulging with posterior annular fissure. Moderate to advanced spinal stenosis. L5 compression in the bilateral subarticular recesses. Bilateral L4 foraminal impingement L5-S1:Posterior annular fissures and mild disc bulging. Negative facets. IMPRESSION: 1. 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 No definite myeloma. 2. Degenerative disease causes canal and biforaminal impingement at L4-5. Electronically Signed   By: Monte Fantasia M.D.   On: 09/01/2018 13:38   Ir Radiologist Eval & Mgmt  Result Date: 09/01/2018 Please refer to notes tab for details about interventional procedure. (Op Note)   ASSESSMENT & PLAN:   57 y.o. male with  1. 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  Based on the diagnosis, patient was started on treatment with bortezomib & low-dose dexamethasone.  Initially, lenalidomide was omitted out of concern for high risk of for infectious complications.  We have continued to therapy with just a doublet treatment and did not reintensify the therapy.  Treatment course has been complicated by exacerbation of pulmonary hypertension and now with shingles reactivation BM 03/18/18 shows limited response with still 18% plasma cell in the bone  marrow. Cytogenetics shows  14.5% [id number]  PLAN:  -Grade I neuropathy, will continue to watch this and pt will let me know if this worsens -not needing pain medications for his back pain now. -Pt has not had h/o blood clots -continue Acyclovir prophylaxis -Recommend that the pt begin taking Vitamin B complex  -Discussed pt labwork today, 09/02/18; blood counts and chemistries are stable  -Discussed the 09/01/18 MRI Lumbar which revealed L4 body abnormality is more consistent with Schmorl's node than myeloma. No definite myeloma. 2. Degenerative disease causes canal and biforaminal impingement at L4-5.  -Continue 29m aspirin -O2 sats at 92% today, SOB, dark yellow phlegm productive cough  -Discussed that I recommend that the pt hold treatment for his Myeloma treatment with concerns for a pneumonia. Advised he stop taking Revlimid at home. Will postpone Velcade as well.  -Recommended that the pt present to the ED for a CTA Chest and evaluation of a possible pneumonia vs blood clot in setting of pulmonary hypertension  -Recommend XR Neck to rule out possible myeloma involvement as well    Discontinue schedule treatments for till f/u with Dr KIrene Limboin the week of 09/21/2018 with Recommend patient go to the ER today for urgent evaluation of Shortness of breath with concern for pneumonia vs PE vs  infected pleural fluid collection.   All of the patients questions were answered with apparent satisfaction. The patient knows to call the clinic with any problems, questions or concerns.  The total time spent in the appt was 30 minutes and more than 50% was on counseling and direct patient cares.     Sullivan Lone MD MS AAHIVMS [facility] Hematology/Oncology Physician [facility]  (Office):       [phone] (Work cell):  [phone] (Fax):           [phone]  09/03/2018 12:46 PM  I, Baldwin [place], am acting as a scribe for Dr. Irene Limbo  .I have reviewed the above documentation  for accuracy and completeness, and I agree with the above. Brunetta Genera MD

## 2018-09-03 ENCOUNTER — Inpatient Hospital Stay (HOSPITAL_BASED_OUTPATIENT_CLINIC_OR_DEPARTMENT_OTHER): Payer: Medicare Other | Admitting: Hematology

## 2018-09-03 ENCOUNTER — Other Ambulatory Visit: Payer: Self-pay

## 2018-09-03 ENCOUNTER — Encounter (HOSPITAL_COMMUNITY): Payer: Self-pay

## 2018-09-03 ENCOUNTER — Encounter: Payer: Self-pay | Admitting: Hematology

## 2018-09-03 ENCOUNTER — Emergency Department (HOSPITAL_COMMUNITY): Payer: Medicare Other

## 2018-09-03 ENCOUNTER — Ambulatory Visit: Payer: Self-pay | Admitting: Hematology

## 2018-09-03 ENCOUNTER — Inpatient Hospital Stay: Payer: Medicare Other

## 2018-09-03 ENCOUNTER — Ambulatory Visit: Payer: Self-pay

## 2018-09-03 ENCOUNTER — Inpatient Hospital Stay (HOSPITAL_COMMUNITY)
Admission: EM | Admit: 2018-09-03 | Discharge: 2018-09-05 | DRG: 193 | Disposition: A | Discharge status: Home / Self Care | Payer: Medicare Other | Attending: Family Medicine | Admitting: Family Medicine

## 2018-09-03 VITALS — BP 120/83 | HR 103 | Temp 98.5°F | Resp 16 | Ht 65.0 in | Wt 80.7 lb

## 2018-09-03 DIAGNOSIS — J189 Pneumonia, unspecified organism: Secondary | ICD-10-CM | POA: Diagnosis present

## 2018-09-03 DIAGNOSIS — J440 Chronic obstructive pulmonary disease with acute lower respiratory infection: Secondary | ICD-10-CM | POA: Diagnosis present

## 2018-09-03 DIAGNOSIS — I272 Pulmonary hypertension, unspecified: Secondary | ICD-10-CM

## 2018-09-03 DIAGNOSIS — I10 Essential (primary) hypertension: Secondary | ICD-10-CM | POA: Diagnosis present

## 2018-09-03 DIAGNOSIS — C9000 Multiple myeloma not having achieved remission: Secondary | ICD-10-CM

## 2018-09-03 DIAGNOSIS — E43 Unspecified severe protein-calorie malnutrition: Secondary | ICD-10-CM

## 2018-09-03 DIAGNOSIS — Z681 Body mass index (BMI) 19 or less, adult: Secondary | ICD-10-CM | POA: Diagnosis not present

## 2018-09-03 DIAGNOSIS — Z79899 Other long term (current) drug therapy: Secondary | ICD-10-CM

## 2018-09-03 DIAGNOSIS — R634 Abnormal weight loss: Secondary | ICD-10-CM

## 2018-09-03 DIAGNOSIS — G629 Polyneuropathy, unspecified: Secondary | ICD-10-CM

## 2018-09-03 DIAGNOSIS — Z7982 Long term (current) use of aspirin: Secondary | ICD-10-CM

## 2018-09-03 DIAGNOSIS — B029 Zoster without complications: Secondary | ICD-10-CM

## 2018-09-03 DIAGNOSIS — R0602 Shortness of breath: Secondary | ICD-10-CM

## 2018-09-03 DIAGNOSIS — Z9221 Personal history of antineoplastic chemotherapy: Secondary | ICD-10-CM | POA: Diagnosis not present

## 2018-09-03 DIAGNOSIS — M542 Cervicalgia: Secondary | ICD-10-CM

## 2018-09-03 DIAGNOSIS — M545 Low back pain: Secondary | ICD-10-CM

## 2018-09-03 LAB — CBC
HEMATOCRIT: 31.5 % — AB (ref 39.0–52.0)
HEMOGLOBIN: 9.7 g/dL — AB (ref 13.0–17.0)
MCH: 23.5 pg — ABNORMAL LOW (ref 26.0–34.0)
MCHC: 30.8 g/dL (ref 30.0–36.0)
MCV: 76.3 fL — ABNORMAL LOW (ref 80.0–100.0)
NRBC: 0 % (ref 0.0–0.2)
Platelets: 311 10*3/uL (ref 150–400)
RBC: 4.13 MIL/uL — ABNORMAL LOW (ref 4.22–5.81)
RDW: 16.4 % — AB (ref 11.5–15.5)
WBC: 11.6 10*3/uL — AB (ref 4.0–10.5)

## 2018-09-03 LAB — CMP (CANCER CENTER ONLY)
ALBUMIN: 2.5 g/dL — AB (ref 3.5–5.0)
ALT: 11 U/L (ref 0–44)
ANION GAP: 8 (ref 5–15)
AST: 12 U/L — ABNORMAL LOW (ref 15–41)
Alkaline Phosphatase: 75 U/L (ref 38–126)
BUN: 17 mg/dL (ref 6–20)
CHLORIDE: 102 mmol/L (ref 98–111)
CO2: 27 mmol/L (ref 22–32)
Calcium: 8.5 mg/dL — ABNORMAL LOW (ref 8.9–10.3)
Creatinine: 0.92 mg/dL (ref 0.61–1.24)
GFR, Est AFR Am: >60 mL/min (ref >60)
GFR, Estimated: >60 mL/min (ref >60)
GLUCOSE: 166 mg/dL — AB (ref 70–99)
Potassium: 3.7 mmol/L (ref 3.5–5.1)
SODIUM: 137 mmol/L (ref 135–145)
TOTAL PROTEIN: 7 g/dL (ref 6.5–8.1)
Total Bilirubin: 0.2 mg/dL — ABNORMAL LOW (ref 0.3–1.2)

## 2018-09-03 LAB — CBC WITH DIFFERENTIAL/PLATELET
Abs Immature Granulocytes: 0.06 10*3/uL (ref 0.00–0.07)
BASOS PCT: 1 %
Basophils Absolute: 0.1 10*3/uL (ref 0.0–0.1)
EOS PCT: 0 %
Eosinophils Absolute: 0 10*3/uL (ref 0.0–0.5)
HCT: 34.7 % — ABNORMAL LOW (ref 39.0–52.0)
HEMOGLOBIN: 11 g/dL — AB (ref 13.0–17.0)
Immature Granulocytes: 1 %
Lymphocytes Relative: 8 %
Lymphs Abs: 1 10*3/uL (ref 0.7–4.0)
MCH: 23.8 pg — AB (ref 26.0–34.0)
MCHC: 31.7 g/dL (ref 30.0–36.0)
MCV: 74.9 fL — ABNORMAL LOW (ref 80.0–100.0)
MONO ABS: 1.7 10*3/uL — AB (ref 0.1–1.0)
Monocytes Relative: 13 %
NEUTROS PCT: 77 %
Neutro Abs: 9.7 10*3/uL — ABNORMAL HIGH (ref 1.7–7.7)
PLATELETS: 338 10*3/uL (ref 150–400)
RBC: 4.63 MIL/uL (ref 4.22–5.81)
RDW: 16.2 % — ABNORMAL HIGH (ref 11.5–15.5)
WBC: 12.5 10*3/uL — ABNORMAL HIGH (ref 4.0–10.5)
nRBC: 0 % (ref 0.0–0.2)

## 2018-09-03 LAB — INFLUENZA PANEL BY PCR (TYPE A & B)
INFLBPCR: NEGATIVE
Influenza A By PCR: NEGATIVE

## 2018-09-03 LAB — CREATININE, SERUM: CREATININE: 0.78 mg/dL (ref 0.61–1.24)

## 2018-09-03 LAB — I-STAT CG4 LACTIC ACID, ED
Lactic Acid, Venous: 1.54 mmol/L (ref 0.5–1.9)
Lactic Acid, Venous: 1.43 mmol/L (ref 0.5–1.9)

## 2018-09-03 LAB — I-STAT TROPONIN, ED: TROPONIN I, POC: 0 ng/mL (ref 0.00–0.08)

## 2018-09-03 MED ORDER — SODIUM CHLORIDE 0.9 % IV SOLN
1.0000 g | Freq: Once | INTRAVENOUS | Status: AC
  Administered 2018-09-03: 1 g via INTRAVENOUS
  Filled 2018-09-03: qty 10

## 2018-09-03 MED ORDER — SODIUM CHLORIDE 0.9 % IV SOLN
500.0000 mg | INTRAVENOUS | Status: DC
  Administered 2018-09-04: 500 mg via INTRAVENOUS
  Filled 2018-09-03 (×2): qty 500

## 2018-09-03 MED ORDER — SODIUM CHLORIDE 0.9 % IV BOLUS
500.0000 mL | Freq: Once | INTRAVENOUS | Status: AC
  Administered 2018-09-03: 500 mL via INTRAVENOUS

## 2018-09-03 MED ORDER — IPRATROPIUM BROMIDE 0.02 % IN SOLN: 0.5000 mg | Freq: Four times a day (QID) | RESPIRATORY_TRACT | Status: DC

## 2018-09-03 MED ORDER — IOPAMIDOL (ISOVUE-370) INJECTION 76%
INTRAVENOUS | Status: AC
  Filled 2018-09-03: qty 100

## 2018-09-03 MED ORDER — ONDANSETRON HCL 4 MG PO TABS: 4.0000 mg | ORAL_TABLET | Freq: Four times a day (QID) | ORAL | Status: DC | PRN

## 2018-09-03 MED ORDER — NORTRIPTYLINE HCL 25 MG PO CAPS
25.0000 mg | ORAL_CAPSULE | Freq: Every day | ORAL | Status: DC
  Administered 2018-09-03 – 2018-09-04 (×2): 25 mg via ORAL
  Filled 2018-09-03 (×3): qty 1

## 2018-09-03 MED ORDER — ACETAMINOPHEN 325 MG PO TABS: 650.0000 mg | ORAL_TABLET | Freq: Four times a day (QID) | ORAL | Status: DC | PRN

## 2018-09-03 MED ORDER — IPRATROPIUM-ALBUTEROL 0.5-2.5 (3) MG/3ML IN SOLN
3.0000 mL | Freq: Four times a day (QID) | RESPIRATORY_TRACT | Status: DC
  Administered 2018-09-03: 3 mL via RESPIRATORY_TRACT

## 2018-09-03 MED ORDER — GUAIFENESIN ER 600 MG PO TB12
1200.0000 mg | ORAL_TABLET | Freq: Two times a day (BID) | ORAL | Status: DC
  Administered 2018-09-03 – 2018-09-05 (×4): 1200 mg via ORAL
  Filled 2018-09-03 (×4): qty 2

## 2018-09-03 MED ORDER — ASPIRIN EC 81 MG PO TBEC
81.0000 mg | DELAYED_RELEASE_TABLET | Freq: Every day | ORAL | Status: DC
  Administered 2018-09-04 – 2018-09-05 (×2): 81 mg via ORAL
  Filled 2018-09-03 (×2): qty 1

## 2018-09-03 MED ORDER — SODIUM CHLORIDE 0.9 % IV SOLN
INTRAVENOUS | Status: DC
  Administered 2018-09-03 – 2018-09-04 (×2): via INTRAVENOUS

## 2018-09-03 MED ORDER — PANTOPRAZOLE SODIUM 40 MG PO TBEC
40.0000 mg | DELAYED_RELEASE_TABLET | Freq: Every day | ORAL | Status: DC
  Administered 2018-09-04 – 2018-09-05 (×2): 40 mg via ORAL
  Filled 2018-09-03 (×2): qty 1

## 2018-09-03 MED ORDER — ACETAMINOPHEN 650 MG RE SUPP: 650.0000 mg | Freq: Four times a day (QID) | RECTAL | Status: DC | PRN

## 2018-09-03 MED ORDER — SODIUM CHLORIDE 0.9 % IV SOLN
500.0000 mg | Freq: Once | INTRAVENOUS | Status: AC
  Administered 2018-09-03: 500 mg via INTRAVENOUS

## 2018-09-03 MED ORDER — ACYCLOVIR 400 MG PO TABS
400.0000 mg | ORAL_TABLET | Freq: Every day | ORAL | Status: DC
  Administered 2018-09-04 – 2018-09-05 (×2): 400 mg via ORAL
  Filled 2018-09-03 (×2): qty 1

## 2018-09-03 MED ORDER — IPRATROPIUM-ALBUTEROL 0.5-2.5 (3) MG/3ML IN SOLN: 3.0000 mL | Freq: Once | RESPIRATORY_TRACT | Status: DC

## 2018-09-03 MED ORDER — ENOXAPARIN SODIUM 30 MG/0.3ML ~~LOC~~ SOLN
30.0000 mg | SUBCUTANEOUS | Status: DC
  Administered 2018-09-03 – 2018-09-04 (×2): 30 mg via SUBCUTANEOUS
  Filled 2018-09-03 (×2): qty 0.3

## 2018-09-03 MED ORDER — OXYCODONE-ACETAMINOPHEN 5-325 MG PO TABS: 1.0000 | ORAL_TABLET | Freq: Four times a day (QID) | ORAL | Status: DC | PRN

## 2018-09-03 MED ORDER — IOPAMIDOL (ISOVUE-370) INJECTION 76%
100.0000 mL | Freq: Once | INTRAVENOUS | Status: AC | PRN
  Administered 2018-09-03: 100 mL via INTRAVENOUS

## 2018-09-03 MED ORDER — SODIUM CHLORIDE 0.9 % IV SOLN
1.0000 g | INTRAVENOUS | Status: DC
  Administered 2018-09-04: 1 g via INTRAVENOUS
  Filled 2018-09-03: qty 10
  Filled 2018-09-03: qty 1

## 2018-09-03 MED ORDER — IPRATROPIUM-ALBUTEROL 0.5-2.5 (3) MG/3ML IN SOLN
3.0000 mL | Freq: Three times a day (TID) | RESPIRATORY_TRACT | Status: DC
  Administered 2018-09-04 – 2018-09-05 (×5): 3 mL via RESPIRATORY_TRACT
  Filled 2018-09-03 (×5): qty 3

## 2018-09-03 MED ORDER — SODIUM CHLORIDE 0.9 % IJ SOLN
INTRAMUSCULAR | Status: AC
  Filled 2018-09-03: qty 50

## 2018-09-03 MED ORDER — ONDANSETRON HCL 4 MG/2ML IJ SOLN: 4.0000 mg | Freq: Four times a day (QID) | INTRAMUSCULAR | Status: DC | PRN

## 2018-09-03 MED ORDER — DOCUSATE SODIUM 50 MG PO CAPS
50.0000 mg | ORAL_CAPSULE | Freq: Every day | ORAL | Status: DC
  Administered 2018-09-04 – 2018-09-05 (×2): 50 mg via ORAL
  Filled 2018-09-03 (×2): qty 1

## 2018-09-03 MED ORDER — ALBUTEROL SULFATE (2.5 MG/3ML) 0.083% IN NEBU: 2.5000 mg | INHALATION_SOLUTION | Freq: Four times a day (QID) | RESPIRATORY_TRACT | Status: DC

## 2018-09-03 NOTE — ED Notes (Signed)
ED TO INPATIENT HANDOFF REPORT  Name/Age/Gender [name] [name] 57 y.o. male  Code Status Code Status History    Date Active Date Inactive Code Status Order ID Comments User Context   02/07/2018 0820 02/14/2018 1943 Full Code [id number]  Lady Deutscher, MD ED   01/26/2018 1831 01/29/2018 1630 Full Code [id number]  Cristy Folks, MD Inpatient   11/06/2016 0103 11/09/2016 1739 Full Code [id number]  Rise Patience, MD Inpatient   01/11/2016 2227 01/23/2016 1654 Full Code [id number]  Reubin Milan, MD Inpatient   10/02/2015 1505 10/20/2015 2127 Full Code [id number]  Nani Skillern, PA-C Inpatient   09/26/2015 0425 09/30/2015 2012 Full Code [id number]  Danford, Suann Larry, MD Inpatient      Home/SNF/Other Home  Chief Complaint From [facility] center  Level of Care/Admitting Diagnosis ED Disposition    ED Disposition Condition [place]: [facility] [[id number]]  Level of Care: Med-Surg [16]  Diagnosis: CAP (community acquired pneumonia) [[id number]]  Admitting Physician: Oswald Hillock [[id number]]  Attending Physician: Oswald Hillock [[id number]]  Estimated length of stay: 3 - 4 days  Certification:: I certify this patient will need inpatient services for at least 2 midnights  PT Class (Do Not Modify): Inpatient [101]  PT Acc Code (Do Not Modify): Private [1]       Medical History Past Medical History:  Diagnosis Date  . Acute on chronic respiratory failure ([place]) 02/07/2018  . Hypertension   . Kidney stones   . Pulmonary hypertension ([place])     Allergies No Known Allergies  IV Location/Drains/Wounds Patient Lines/Drains/Airways Status   Active Line/Drains/Airways    Name:   Placement date:   Placement time:   Site:   Days:   Peripheral IV 09/03/18 Left Antecubital   09/03/18    1513    Antecubital   less than 1   PICC Single Lumen 10/19/15 PICC Right Brachial 36 cm 0 cm   10/19/15    1240    Brachial   1050   PICC Single Lumen 01/16/16 PICC Right  Basilic 37 cm 1 cm   79/89/21    1941    Basilic   740   Chest Tube 1 Left Other (Comment) 12 Fr.   10/17/15    -    Other (Comment)   1052   Incision (Closed) 09/27/15 Back Left   09/27/15    1637     1072   Incision (Closed) 10/10/15 Back Left   10/10/15    1103     1059   Wound / Incision (Open or Dehisced) 02/14/18 Other (Comment) Back Left;Mid Small pea size skin crack below scapula.    02/14/18    0830    Back   201          Labs/Imaging Results for orders placed or performed during the hospital encounter of 09/03/18 (from the past 48 hour(s))  I-stat troponin, ED     Status: None   Collection Time: 09/03/18  2:46 PM  Result Value Ref Range   Troponin i, poc 0.00 0.00 - 0.08 ng/mL   Comment 3            Comment: Due to the release kinetics of cTnI, a negative result within the first hours of the onset of symptoms does not rule out myocardial infarction with certainty. If myocardial infarction is still suspected, repeat the test at appropriate intervals.   I-Stat CG4 Lactic Acid, ED  Status: None   Collection Time: 09/03/18  2:49 PM  Result Value Ref Range   Lactic Acid, Venous 1.54 0.5 - 1.9 mmol/L  Influenza panel by PCR (type A & B)     Status: None   Collection Time: 09/03/18  4:11 PM  Result Value Ref Range   Influenza A By PCR NEGATIVE NEGATIVE   Influenza B By PCR NEGATIVE NEGATIVE    Comment: (NOTE) The Xpert Xpress Flu assay is intended as an aid in the diagnosis of  influenza and should not be used as a sole basis for treatment.  This  assay is FDA approved for nasopharyngeal swab specimens only. Nasal  washings and aspirates are unacceptable for Xpert Xpress Flu testing. Performed at [facility], [place] [address] [place], [place] [address]   I-Stat CG4 Lactic Acid, ED     Status: None   Collection Time: 09/03/18  4:15 PM  Result Value Ref Range   Lactic Acid, Venous 1.43 0.5 - 1.9 mmol/L   Dg Cervical Spine Complete  Result Date:  09/03/2018 CLINICAL DATA:  Soft tissue prominence right periauricular region. Multiple myeloma. EXAM: CERVICAL SPINE - COMPLETE 4+ VIEW COMPARISON:  None. FINDINGS: Frontal, lateral, open-mouth odontoid, and bilateral oblique views were obtained. There is no evident fracture or spondylolisthesis. Prevertebral soft tissues and predental space regions are normal. There is of moderate disc space narrowing at C3-4 and C5-6. There is more severe disc space narrowing at C6-7. There is facet hypertrophy with exit foraminal narrowing at C5-6, C6-7, and C7-T1 bilaterally. There are no blastic or lytic bone lesions. No soft tissue masses are evident by radiography. There is reversal of lordotic curvature. Visualized upper lung regions are clear.  Bilaterally IMPRESSION: Multilevel osteoarthritic change. Reversal of lordotic curvature is likely indicative of muscle spasm. No fracture or spondylolisthesis. No blastic or lytic lesions. No soft tissue lesions are evident by radiography. If there remains concern for soft tissue mass, MR would be the imaging study of choice to further evaluate. Electronically Signed   By: Lowella Grip III M.D.   On: 09/03/2018 14:13   Ct Angio Chest Pe W/cm &/or Wo Cm  Result Date: 09/03/2018 CLINICAL DATA:  Angina Dyspnea Best images possible due to patient cooperation/condition EXAM: CT ANGIOGRAPHY CHEST WITH CONTRAST TECHNIQUE: Multidetector CT imaging of the chest was performed using the standard protocol during bolus administration of intravenous contrast. Multiplanar CT image reconstructions and MIPs were obtained to evaluate the vascular anatomy. CONTRAST:  136m ISOVUE-370 IOPAMIDOL (ISOVUE-370) INJECTION 76% COMPARISON:  02/07/2018 and previous FINDINGS: Cardiovascular: Heart size normal. Massive dilatation of central pulmonary arteries. No convincing filling defects to suggest acute PE, although there is significant motion degradation throughout the examination limiting  peripheral evaluation. Adequate contrast opacification of the thoracic aorta with no evidence of dissection, aneurysm, or stenosis. There is classic 3-vessel brachiocephalic arch anatomy without proximal stenosis. Mediastinum/Nodes: No hilar or mediastinal adenopathy. Lungs/Pleura: Loculated left pleural gas and fluid collections laterally and at the left lung base with associated pleural thickening and coarse calcifications. Stable consolidation in the adjacent left lung base. Partially calcified pleural plaques bilaterally. Bronchiectasis in the right lower lobe with partial airway occlusion by secretions in some increase in patchy airspace consolidation most marked in the posterior basal segment right lower lobe. Upper Abdomen: No acute findings. Musculoskeletal: No acute findings. Detail is significantly degraded by continued patient motion during the acquisition. Review of the MIP images confirms the above findings. IMPRESSION: 1. Negative for acute  PE or thoracic aortic dissection, with limitations as above. 2. Chronic massive dilatation of central pulmonary arteries consistent with pulmonary arterial hypertension. 3. Bronchiectasis with interval increase in central airway secretions and airspace infiltrates in the right lower lobe. 4. Chronic loculated left pleural gas and fluid. Electronically Signed   By: Lucrezia Europe M.D.   On: 09/03/2018 15:41   EKG Interpretation  Date/Time:  Thursday September 03 2018 15:17:39 EDT Ventricular Rate:  86 PR Interval:    QRS Duration: 84 QT Interval:  364 QTC Calculation: 436 R Axis:   102 Text Interpretation:  Sinus rhythm Probable left atrial enlargement Anteroseptal infarct, age indeterminate Baseline wander T wave abnormality Abnormal ekg Confirmed by Carmin Muskrat [phone]) on 09/03/2018 3:25:50 PM   Pending Labs Unresulted Labs (From admission, onward)    Start     Ordered   Signed and Held  CBC  (enoxaparin (LOVENOX)    CrCl >/= 30 ml/min)  Once,   R     Comments:  Baseline for enoxaparin therapy IF NOT ALREADY DRAWN.  Notify MD if PLT < 100 K.    Signed and Held   Signed and Held  Creatinine, serum  (enoxaparin (LOVENOX)    CrCl >/= 30 ml/min)  Once,   R    Comments:  Baseline for enoxaparin therapy IF NOT ALREADY DRAWN.    Signed and Held   Signed and Held  Creatinine, serum  (enoxaparin (LOVENOX)    CrCl >/= 30 ml/min)  Weekly,   R    Comments:  while on enoxaparin therapy    Signed and Held   Signed and Held  CBC  Tomorrow morning,   R     Signed and Held   Signed and Held  Comprehensive metabolic panel  Tomorrow morning,   R     Signed and Held   Signed and Held  Expectorated sputum assessment w rflx to resp cult  Once,   R     Signed and Held   Signed and Held  Strep pneumoniae urinary antigen  Once,   R     Signed and Held          Vitals/Pain Today's Vitals   09/03/18 1623 09/03/18 1730 09/03/18 1738 09/03/18 1844  BP: 111/78 110/72 110/72 99/69  Pulse: 89 94 91 85  Resp: _0 SpO2: 93% 97% 96% 97%  PainSc:        Isolation Precautions Droplet precaution  Medications Medications  sodium chloride 0.9 % injection (has no administration in time range)  iopamidol (ISOVUE-370) 76 % injection (has no administration in time range)  ipratropium-albuterol (DUONEB) 0.5-2.5 (3) MG/3ML nebulizer solution 3 mL (has no administration in time range)  sodium chloride 0.9 % bolus 500 mL (0 mLs Intravenous Stopped 09/03/18 1553)  iopamidol (ISOVUE-370) 76 % injection 100 mL (100 mLs Intravenous Contrast Given 09/03/18 1523)  cefTRIAXone (ROCEPHIN) 1 g in sodium chloride 0.9 % 100 mL IVPB (0 g Intravenous Stopped 09/03/18 1809)  azithromycin (ZITHROMAX) 500 mg in sodium chloride 0.9 % 250 mL IVPB (500 mg Intravenous New Bag/Given 09/03/18 1814)    Mobility walks

## 2018-09-03 NOTE — Progress Notes (Signed)
Patient transported in wheelchair by this Probation officer to [facility] ED, Room 1, for evaluation. Reported to Ronalee Belts, RN, per Dr. Irene Limbo, pt has hypoxia, crackles and productive cough. Given copy of labs drawn this morning. Translator Kek with patient.

## 2018-09-03 NOTE — ED Notes (Signed)
Bed: WA01 Expected date: 09/03/18 Expected time: 1:02 PM Means of arrival: Other Comments: [facility]

## 2018-09-03 NOTE — ED Provider Notes (Signed)
Care assumed from previous provider PA Maczis. Please see note for further details. Case discussed, plan agreed upon. Will follow up on pending CT angios and flu swab.   Flu negative. CT angio reviewed with attending, Dr. Gilford Raid. Will start on ABX.   IMPRESSION: 1. Negative for acute PE or thoracic aortic dissection, with limitations as above. 2. Chronic massive dilatation of central pulmonary arteries consistent with pulmonary arterial hypertension. 3. Bronchiectasis with interval increase in central airway secretions and airspace infiltrates in the right lower lobe. 4. Chronic loculated left pleural gas and fluid.  Ambulated in ED and O2 dropped to mid 70's. Hospitalist consulted who will admit.    [name] [name], [name] Almond, PA-C 09/03/18 1734    Isla Pence, MD 09/03/18 [phone]

## 2018-09-03 NOTE — Patient Instructions (Signed)
Thank you for choosing [facility] to provide your oncology and hematology care.  To afford each patient quality time with our providers, please arrive 30 minutes before your scheduled appointment time.  If you arrive late for your appointment, you may be asked to reschedule.  We strive to give you quality time with our providers, and arriving late affects you and other patients whose appointments are after yours.    If you are a no show for multiple scheduled visits, you may be dismissed from the clinic at the providers discretion.     Again, thank you for choosing [facility], our hope is that these requests will decrease the amount of time that you wait before being seen by our physicians.  ______________________________________________________________________   Should you have questions after your visit to the [facility], please contact our office at [phone] between the hours of 8:30 and 4:30 p.m.    Voicemails left after 4:30p.m will not be returned until the following business day.     For prescription refill requests, please have your pharmacy contact us directly.  Please also try to allow 48 hours for prescription requests.     Please contact the scheduling department for questions regarding scheduling.  For scheduling of procedures such as PET scans, CT scans, MRI, Ultrasound, etc please contact central scheduling at [phone].     Resources For Cancer Patients and Caregivers:    Oncolink.org:  A wonderful resource for patients and healthcare providers for information regarding your disease, ways to tract your treatment, what to expect, etc.      American Cancer [employer]:  800-227-2345  Can help patients locate various types of support and financial assistance   Cancer Care: 1-800-813-HOPE (4673) Provides financial assistance, online support groups, medication/co-pay assistance.     [place] DSS:  336-641-3447 Where to apply  for food stamps, Medicaid, and utility assistance   Medicare Rights Center: 800-333-4114 Helps people with Medicare understand their rights and benefits, navigate the Medicare system, and secure the quality healthcare they deserve   SCAT: 336-333-6589 [place] [employer]'s shared-ride transportation service for eligible riders who have a disability that prevents them from riding the fixed route bus.     For additional information on assistance programs please contact our social worker:   Abigail Elmore:  [phone]  

## 2018-09-03 NOTE — ED Triage Notes (Signed)
From [facility] with Hypoxia and crackles in lungs per Dr Irene Limbo. Concern for PE. [facility] pt for multiple myeloma treatment. Labs drawn this morning at [facility] center.  BP 120/83 HR 103 RR 16 Sp02 92 RA

## 2018-09-03 NOTE — ED Notes (Signed)
o2 sat 87%- Jamie aware

## 2018-09-03 NOTE — H&P (Signed)
TRH H&P    Patient Demographics:    [name] [name], is a 57 y.o. male  MRN: [id number]  DOB - 1961/03/23  Admit Date - 09/03/2018  Referring MD/NP/PA: Roselyn Reef Ward  Outpatient Primary MD for the patient is [name], PA-C  Patient coming from: Home  Chief complaint-shortness of breath   HPI:    [name] [name]  is a 56 y.o. male, with history of pulmonary hypertension, chronic left-sided hydropneumothorax, bronchiectasis, multiple myeloma currently undergoing chemotherapy with Dr. Irene Limbo of oncology presented to the ED with shortness of breath.  Patient says that for 5 days he has been coughing up yellow-colored phlegm also has been having subjective fever and chills.  Dyspnea on exertion.  Today he was seen at oncologist office and was noted to have tachycardia with O2 sats 92%.  There was concern for pulmonary embolism versus pneumonia so he was sent for CT angiogram of the chest.  CTA chest is negative for pulmonary embolism shows bronchiectasis with interval increase in the central airway secretion and airspace infiltrates in the right lower lobe. Patient was started on ceftriaxone and Zithromax for pneumonia He denies nausea vomiting or diarrhea. Denies dysuria or abdominal pain No previous history of stroke or seizures.     Review of systems:    In addition to the HPI above,   All other systems reviewed and are negative.   With Past History of the following :    Past Medical History:  Diagnosis Date  . Acute on chronic respiratory failure ([place]) 02/07/2018  . Hypertension   . Kidney stones   . Pulmonary hypertension ([place])       Past Surgical History:  Procedure Laterality Date  . CARDIAC CATHETERIZATION N/A 01/18/2016   Procedure: Right Heart Cath;  Surgeon: Jolaine Artist, MD;  Location: [facility];  Service: Cardiovascular;  Laterality: N/A;  . CARDIAC CATHETERIZATION N/A 06/24/2016   Procedure: Right Heart Cath;  Surgeon: Jolaine Artist, MD;  Location: [facility];  Service: Cardiovascular;  Laterality: N/A;  . IR RADIOLOGIST EVAL & MGMT  05/27/2018  . IR RADIOLOGIST EVAL & MGMT  09/01/2018  . KIDNEY SURGERY    . VIDEO ASSISTED THORACOSCOPY (VATS)/DECORTICATION Left 2003  . VIDEO ASSISTED THORACOSCOPY (VATS)/DECORTICATION Left 10/02/2015   Procedure: VIDEO ASSISTED THORACOSCOPY (VATS)/DECORTICATION and drainage of chronic empyena;  Surgeon: [name], MD;  Location: [facility];  Service: Thoracic;  Laterality: Left;  Marland Kitchen VIDEO BRONCHOSCOPY N/A 10/02/2015   Procedure: VIDEO BRONCHOSCOPY;  Surgeon: [name], MD;  Location: [facility];  Service: Thoracic;  Laterality: N/A;      Social History:      Social History   Tobacco Use  . Smoking status: Never Smoker  . Smokeless tobacco: Never Used  Substance Use Topics  . Alcohol use: Yes       Family History :     Family History  Problem Relation Age of Onset  . Hypertension Other       Home Medications:   Prior to Admission  medications   Medication Sig Start Date End Date Taking? Authorizing Provider  acyclovir (ZOVIRAX) 400 MG tablet Take 1 tablet (400 mg total) by mouth daily. 08/20/18  Yes Brunetta Genera, MD  amitriptyline (ELAVIL) 25 MG tablet Take 25 mg by mouth at bedtime.   Yes [provider]  aspirin EC 81 MG tablet Take 1 tablet (81 mg total) by mouth daily. 08/06/18  Yes Brunetta Genera, MD  docusate sodium (COLACE) 50 MG capsule Take 1 capsule (50 mg total) by mouth daily. 03/06/18  Yes [name], PA-C  Fluticasone-Umeclidin-Vilant (TRELEGY ELLIPTA) 100-62.5-25 MCG/INH AEPB Inhale 1 puff into the lungs daily. 07/31/18  Yes Tanda Rockers, MD  nortriptyline (PAMELOR) 25 MG capsule Take 1 capsule (25 mg total) by mouth at bedtime. 08/20/18  Yes Brunetta Genera, MD  omeprazole (PRILOSEC) 40 MG capsule Take 1 capsule (40 mg total) by mouth daily. 08/20/18   Yes Brunetta Genera, MD  potassium chloride SA (K-DUR,KLOR-CON) 20 MEQ tablet Take 1 tablet (20 mEq total) by mouth 2 (two) times daily. 08/06/18  Yes Brunetta Genera, MD  umeclidinium-vilanterol [facility] ELLIPTA) 62.5-25 MCG/INH AEPB Inhale 1 puff into the lungs daily. 03/05/18  Yes [name], PA-C  albuterol (PROVENTIL HFA;VENTOLIN HFA) 108 (90 Base) MCG/ACT inhaler Inhale 2 puffs into the lungs every 6 (six) hours as needed for wheezing or shortness of breath. 07/31/18   Tanda Rockers, MD  dexamethasone (DECADRON) 4 MG tablet TAKE 5 TABLETS (20 MG TOTAL) BY MOUTH ONCE A WEEK. 08/06/18   Brunetta Genera, MD  lenalidomide (REVLIMID) 15 MG capsule Take 1 capsule (15 mg total) by mouth daily. Authorization#[id number] 09/01/18 Patient not taking: Reported on 09/03/2018 09/01/18   Brunetta Genera, MD  nortriptyline (PAMELOR) 25 MG capsule Take 1 capsule (25 mg total) by mouth at bedtime. 03/16/18 04/15/18  Ardath Sax, MD  ondansetron (ZOFRAN) 8 MG tablet Take 1 tablet (8 mg total) by mouth every 8 (eight) hours as needed for nausea. Patient not taking: Reported on 09/01/2018 08/27/18   Brunetta Genera, MD  oxyCODONE-acetaminophen (PERCOCET/ROXICET) 5-325 MG tablet Take 1 tablet by mouth every 6 (six) hours as needed for severe pain. 08/27/18   Brunetta Genera, MD  OXYGEN 2lpm BEDTIME ONLY    [provider]     Allergies:    No Known Allergies   Physical Exam:   Vitals  Blood pressure 110/72, pulse 91, resp. rate 18, SpO2 96 %.  1.  General: Appears in no acute distress  2. Psychiatric:  Intact judgement and  insight, awake alert, oriented x 3.  3. Neurologic: No focal neurological deficits, all cranial nerves intact.Strength 5/5 all 4 extremities, sensation intact all 4 extremities, plantars down going.  4. Eyes :  anicteric sclerae, moist conjunctivae with no lid lag. PERRLA.  5. ENMT:  Oropharynx clear with moist mucous membranes and  good dentition  6. Neck:  supple, no cervical lymphadenopathy appriciated, No thyromegaly  7. Respiratory : Normal respiratory effort, crackles auscultated at right lung base, rhonchi auscultated left lung field  8. Cardiovascular : RRR, no gallops, rubs or murmurs, no leg edema  9. Gastrointestinal:  Positive bowel sounds, abdomen soft, non-tender to palpation,no hepatosplenomegaly, no rigidity or guarding       10. Skin:  No cyanosis, normal texture and turgor, no rash, lesions or ulcers  11.Musculoskeletal:  Good muscle tone,  joints appear normal , no effusions,  normal range of motion  Data Review:    CBC Recent Labs  Lab 09/03/18 1033  WBC 12.5*  HGB 11.0*  HCT 34.7*  PLT 338  MCV 74.9*  MCH 23.8*  MCHC 31.7  RDW 16.2*  LYMPHSABS 1.0  MONOABS 1.7*  EOSABS 0.0  BASOSABS 0.1   ------------------------------------------------------------------------------------------------------------------  Results for orders placed or performed during the hospital encounter of 09/03/18 (from the past 48 hour(s))  I-stat troponin, ED     Status: None   Collection Time: 09/03/18  2:46 PM  Result Value Ref Range   Troponin i, poc 0.00 0.00 - 0.08 ng/mL   Comment 3            Comment: Due to the release kinetics of cTnI, a negative result within the first hours of the onset of symptoms does not rule out myocardial infarction with certainty. If myocardial infarction is still suspected, repeat the test at appropriate intervals.   I-Stat CG4 Lactic Acid, ED     Status: None   Collection Time: 09/03/18  2:49 PM  Result Value Ref Range   Lactic Acid, Venous 1.54 0.5 - 1.9 mmol/L  Influenza panel by PCR (type A & B)     Status: None   Collection Time: 09/03/18  4:11 PM  Result Value Ref Range   Influenza A By PCR NEGATIVE NEGATIVE   Influenza B By PCR NEGATIVE NEGATIVE    Comment: (NOTE) The Xpert Xpress Flu assay is intended as an aid in the diagnosis of  influenza and  should not be used as a sole basis for treatment.  This  assay is FDA approved for nasopharyngeal swab specimens only. Nasal  washings and aspirates are unacceptable for Xpert Xpress Flu testing. Performed at [facility], [place] [address], [place], [place] [address]   I-Stat CG4 Lactic Acid, ED     Status: None   Collection Time: 09/03/18  4:15 PM  Result Value Ref Range   Lactic Acid, Venous 1.43 0.5 - 1.9 mmol/L    Chemistries  Recent Labs  Lab 09/03/18 1033  NA 137  K 3.7  CL 102  CO2 27  GLUCOSE 166*  BUN 17  CREATININE 0.92  CALCIUM 8.5*  AST 12*  ALT 11  ALKPHOS 75  BILITOT 0.2*   ------------------------------------------------------------------------------------------------------------------  ------------------------------------------------------------------------------------------------------------------ GFR: Estimated Creatinine Clearance: 46.4 mL/min (by C-G formula based on SCr of 0.92 mg/dL). Liver Function Tests: Recent Labs  Lab 09/03/18 1033  AST 12*  ALT 11  ALKPHOS 75  BILITOT 0.2*  PROT 7.0  ALBUMIN 2.5*    --------------------------------------------------------------------------------------------------------------- Urine analysis:    Component Value Date/Time   COLORURINE YELLOW 12/22/2017 [place] 12/22/2017 1323   LABSPEC 1.011 12/22/2017 1323   PHURINE 8.0 12/22/2017 1323   GLUCOSEU NEGATIVE 12/22/2017 1323   HGBUR NEGATIVE 12/22/2017 1323   BILIRUBINUR NEGATIVE 12/22/2017 1323   KETONESUR NEGATIVE 12/22/2017 1323   PROTEINUR NEGATIVE 12/22/2017 1323   UROBILINOGEN 0.2 11/11/2012 0145   NITRITE NEGATIVE 12/22/2017 1323   LEUKOCYTESUR NEGATIVE 12/22/2017 1323      Imaging Results:    Dg Cervical Spine Complete  Result Date: 09/03/2018 CLINICAL DATA:  Soft tissue prominence right periauricular region. Multiple myeloma. EXAM: CERVICAL SPINE - COMPLETE 4+ VIEW COMPARISON:  None. FINDINGS:  Frontal, lateral, open-mouth odontoid, and bilateral oblique views were obtained. There is no evident fracture or spondylolisthesis. Prevertebral soft tissues and predental space regions are normal. There is of moderate disc space narrowing at C3-4 and C5-6. There  is more severe disc space narrowing at C6-7. There is facet hypertrophy with exit foraminal narrowing at C5-6, C6-7, and C7-T1 bilaterally. There are no blastic or lytic bone lesions. No soft tissue masses are evident by radiography. There is reversal of lordotic curvature. Visualized upper lung regions are clear.  Bilaterally IMPRESSION: Multilevel osteoarthritic change. Reversal of lordotic curvature is likely indicative of muscle spasm. No fracture or spondylolisthesis. No blastic or lytic lesions. No soft tissue lesions are evident by radiography. If there remains concern for soft tissue mass, MR would be the imaging study of choice to further evaluate. Electronically Signed   By: Lowella Grip III M.D.   On: 09/03/2018 14:13   Ct Angio Chest Pe W/cm &/or Wo Cm  Result Date: 09/03/2018 CLINICAL DATA:  Angina Dyspnea Best images possible due to patient cooperation/condition EXAM: CT ANGIOGRAPHY CHEST WITH CONTRAST TECHNIQUE: Multidetector CT imaging of the chest was performed using the standard protocol during bolus administration of intravenous contrast. Multiplanar CT image reconstructions and MIPs were obtained to evaluate the vascular anatomy. CONTRAST:  156m ISOVUE-370 IOPAMIDOL (ISOVUE-370) INJECTION 76% COMPARISON:  02/07/2018 and previous FINDINGS: Cardiovascular: Heart size normal. Massive dilatation of central pulmonary arteries. No convincing filling defects to suggest acute PE, although there is significant motion degradation throughout the examination limiting peripheral evaluation. Adequate contrast opacification of the thoracic aorta with no evidence of dissection, aneurysm, or stenosis. There is classic 3-vessel brachiocephalic  arch anatomy without proximal stenosis. Mediastinum/Nodes: No hilar or mediastinal adenopathy. Lungs/Pleura: Loculated left pleural gas and fluid collections laterally and at the left lung base with associated pleural thickening and coarse calcifications. Stable consolidation in the adjacent left lung base. Partially calcified pleural plaques bilaterally. Bronchiectasis in the right lower lobe with partial airway occlusion by secretions in some increase in patchy airspace consolidation most marked in the posterior basal segment right lower lobe. Upper Abdomen: No acute findings. Musculoskeletal: No acute findings. Detail is significantly degraded by continued patient motion during the acquisition. Review of the MIP images confirms the above findings. IMPRESSION: 1. Negative for acute PE or thoracic aortic dissection, with limitations as above. 2. Chronic massive dilatation of central pulmonary arteries consistent with pulmonary arterial hypertension. 3. Bronchiectasis with interval increase in central airway secretions and airspace infiltrates in the right lower lobe. 4. Chronic loculated left pleural gas and fluid. Electronically Signed   By: DLucrezia EuropeM.D.   On: 09/03/2018 15:41    My personal review of EKG: Rhythm NSR   Assessment & Plan:    Active Problems:   CAP (community acquired pneumonia)   1. Community-acquired pneumonia-CTA confirms airspace infiltrate in right lower lobe, will start ceftriaxone and Zithromax.  Obtain urinary strep pneumo antigen.  Obtain blood cultures x2.  Will obtain sputum culture.  2. Bronchiectasis-patient has history of bronchiectasis, has noticed increased secretions with yellow phlegm.  Continue duo nebs every 6 hours, Mucinex 1 tablet p.o. twice daily.  Flutter valve.  3. Multiple myeloma-patient on Velcade and Decadron as per oncology   DVT Prophylaxis-   Lovenox   AM Labs Ordered, also please review Full Orders  Family Communication: Admission, patients  condition and plan of care including tests being ordered have been discussed with the patient who indicate understanding and agree with the plan and Code Status.  Code Status: Full code  Admission status: Inpatient: Based on patients clinical presentation and evaluation of above clinical data, I have made determination that patient meets Inpatient criteria at this time.  Patient requiring  IV antibiotics, continuous nebulizer treatment, flutter valve.  Hypoxic on ambulation.  Time spent in minutes : 60 minutes   Oswald Hillock M.D on 09/03/2018 at 5:50 PM  Between 7am to 7pm - Pager - [phone]. After 7pm go to [email or web address] - password [email or web address]  [facility] - Office  [phone]

## 2018-09-03 NOTE — ED Notes (Signed)
Transport called to transport patient. 

## 2018-09-03 NOTE — ED Provider Notes (Signed)
[facility] DEPT Provider Note   CSN: [id number] Arrival date & time: 09/03/18  1302     History   Chief Complaint Chief Complaint  Patient presents with  . Shortness of Breath   History is provided with in person translater.   HPI [name] [name] is a 57 y.o. male with a history of COPD, CAD, multiple myeloma currently undergoing chemotherapy by Dr. Irene Limbo of oncology who presents the emergent department today with shortness of breath.  Patient reports on Friday he started developing subjective fevers, chills, sob, doe, and a productive cough with brown sputum that is blood tinged. He was seen in his oncologists office today and was noted to have tachycardia of the low 100's, and an oxygen saturation of 92%. There was concern for PE vs PNA so he was sent here for a CTA. Patient did receive blood work PTA. He denies any history of PE/DVT. No lower extremity pain or swelling. He did receive the flu vaccine this year. He reports chest pain only when coughing. No exertional chest pain.   HPI  Past Medical History:  Diagnosis Date  . Acute on chronic respiratory failure ([place]) 02/07/2018  . Hypertension   . Kidney stones   . Pulmonary hypertension [facility])     Patient Active Problem List   Diagnosis Date Noted  . Pulmonary infiltrate on chest x-ray   . High serum lactate 02/07/2018  . Grade II diastolic dysfunction 98/33/8250  . Acute on chronic respiratory failure ([place]) 02/07/2018  . Acute on chronic respiratory failure with hypoxia ([place]) 01/27/2018  . Counseling regarding advanced care planning and goals of care 11/08/2017  . Multiple myeloma ([place]) 11/07/2017  . Facility-acquired pneumonia   . Chronic respiratory failure with hypoxia ([place]) 03/26/2016  . COPD GOLD IV criteria but never smoked  03/11/2016  . Pulmonary HTN ([place])   . Hyperkalemia   . Diarrhea 09/27/2015  . Chest pain 09/27/2015  . Unintentional weight loss 09/27/2015  . Abdominal cramps  09/26/2015  . Pleural effusion, left 09/26/2015  . Viral hepatitis C 08/06/2015    Past Surgical History:  Procedure Laterality Date  . CARDIAC CATHETERIZATION N/A 01/18/2016   Procedure: Right Heart Cath;  Surgeon: Jolaine Artist, MD;  Location: [facility];  Service: Cardiovascular;  Laterality: N/A;  . CARDIAC CATHETERIZATION N/A 06/24/2016   Procedure: Right Heart Cath;  Surgeon: Jolaine Artist, MD;  Location: [facility];  Service: Cardiovascular;  Laterality: N/A;  . IR RADIOLOGIST EVAL & MGMT  05/27/2018  . IR RADIOLOGIST EVAL & MGMT  09/01/2018  . KIDNEY SURGERY    . VIDEO ASSISTED THORACOSCOPY (VATS)/DECORTICATION Left 2003  . VIDEO ASSISTED THORACOSCOPY (VATS)/DECORTICATION Left 10/02/2015   Procedure: VIDEO ASSISTED THORACOSCOPY (VATS)/DECORTICATION and drainage of chronic empyena;  Surgeon: [name], MD;  Location: [place];  Service: Thoracic;  Laterality: Left;  Marland Kitchen VIDEO BRONCHOSCOPY N/A 10/02/2015   Procedure: VIDEO BRONCHOSCOPY;  Surgeon: [name], MD;  Location[facility];  Service: Thoracic;  Laterality: N/A;        Home Medications    Prior to Admission medications   Medication Sig Start Date End Date Taking? Authorizing Provider  acyclovir (ZOVIRAX) 400 MG tablet Take 1 tablet (400 mg total) by mouth daily. 08/20/18   Brunetta Genera, MD  albuterol (PROVENTIL HFA;VENTOLIN HFA) 108 (90 Base) MCG/ACT inhaler Inhale 2 puffs into the lungs every 6 (six) hours as needed for wheezing or shortness of breath. 07/31/18  Tanda Rockers, MD  aspirin EC 81 MG tablet Take 1 tablet (81 mg total) by mouth daily. Patient not taking: Reported on 09/01/2018 08/06/18   Brunetta Genera, MD  dexamethasone (DECADRON) 4 MG tablet TAKE 5 TABLETS (20 MG TOTAL) BY MOUTH ONCE A WEEK. 08/06/18   Brunetta Genera, MD  docusate sodium (COLACE) 50 MG capsule Take 1 capsule (50 mg total) by mouth daily. 03/06/18   [name], PA-C    Fluticasone-Umeclidin-Vilant (TRELEGY ELLIPTA) 100-62.5-25 MCG/INH AEPB Inhale 1 puff into the lungs daily. Patient not taking: Reported on 09/01/2018 07/31/18   Tanda Rockers, MD  lenalidomide (REVLIMID) 15 MG capsule Take 1 capsule (15 mg total) by mouth daily. Authorization#[id number] 09/01/18 09/01/18   Brunetta Genera, MD  nortriptyline (PAMELOR) 25 MG capsule Take 1 capsule (25 mg total) by mouth at bedtime. 03/16/18 04/15/18  Ardath Sax, MD  nortriptyline (PAMELOR) 25 MG capsule Take 1 capsule (25 mg total) by mouth at bedtime. 08/20/18   Brunetta Genera, MD  omeprazole (PRILOSEC) 40 MG capsule Take 1 capsule (40 mg total) by mouth daily. 08/20/18   Brunetta Genera, MD  ondansetron (ZOFRAN) 8 MG tablet Take 1 tablet (8 mg total) by mouth every 8 (eight) hours as needed for nausea. Patient not taking: Reported on 09/01/2018 08/27/18   Brunetta Genera, MD  oxyCODONE-acetaminophen (PERCOCET/ROXICET) 5-325 MG tablet Take 1 tablet by mouth every 6 (six) hours as needed for severe pain. 08/27/18   Brunetta Genera, MD  OXYGEN 2lpm BEDTIME ONLY    [provider]  potassium chloride SA (K-DUR,KLOR-CON) 20 MEQ tablet Take 1 tablet (20 mEq total) by mouth 2 (two) times daily. Patient not taking: Reported on 09/01/2018 08/06/18   Brunetta Genera, MD  umeclidinium-vilanterol [facility] ELLIPTA) 62.5-25 MCG/INH AEPB Inhale 1 puff into the lungs daily. 03/05/18   [name], PA-C    Family History Family History  Problem Relation Age of Onset  . Hypertension Other     Social History Social History   Tobacco Use  . Smoking status: Never Smoker  . Smokeless tobacco: Never Used  Substance Use Topics  . Alcohol use: Yes  . Drug use: No     Allergies   Patient has no known allergies.   Review of Systems Review of Systems  All other systems reviewed and are negative.    Physical Exam Updated Vital Signs BP 115/78   Pulse 94   Resp 16    SpO2 92%  Temp 98.5 F (36.9 C)  Physical Exam  Constitutional: He appears well-developed. No distress.  HENT:  Head: Normocephalic and atraumatic.  Right Ear: External ear normal.  Left Ear: External ear normal.  Nose: Nose normal.  Mouth/Throat: Uvula is midline, oropharynx is clear and moist and mucous membranes are normal. No tonsillar exudate.  Eyes: Pupils are equal, round, and reactive to light. Right eye exhibits no discharge. Left eye exhibits no discharge. No scleral icterus.  Neck: Trachea normal. Neck supple. No spinous process tenderness present. No neck rigidity. Normal range of motion present.    No nuchal rigidity or meningismus  Cardiovascular: Regular rhythm and intact distal pulses. Tachycardia present.  No murmur heard. Pulses:      Radial pulses are 2+ on the right side, and 2+ on the left side.       Dorsalis pedis pulses are 2+ on the right side, and 2+ on the left side.  Posterior tibial pulses are 2+ on the right side, and 2+ on the left side.  No lower extremity swelling or edema. Calves symmetric in size bilaterally.  Pulmonary/Chest: Effort normal. He has rales in the right lower field and the left lower field. He exhibits no tenderness.  Abdominal: Soft. Bowel sounds are normal. He exhibits no distension. There is no tenderness. There is no rebound and no guarding.  Musculoskeletal: He exhibits no edema.  Lymphadenopathy:    He has no cervical adenopathy.  Neurological: He is alert.  Skin: Skin is warm and dry. Capillary refill takes less than 2 seconds. No rash noted. He is not diaphoretic.  Psychiatric: He has a normal mood and affect.  Nursing note and vitals reviewed.    ED Treatments / Results  Labs (all labs ordered are listed, but only abnormal results are displayed) Labs Reviewed  INFLUENZA PANEL BY PCR (TYPE A & B)  I-STAT TROPONIN, ED  I-STAT CG4 LACTIC ACID, ED   Results for orders placed or performed in visit on 09/03/18  CMP  ([place] only)  Result Value Ref Range   Sodium 137 135 - 145 mmol/L   Potassium 3.7 3.5 - 5.1 mmol/L   Chloride 102 98 - 111 mmol/L   CO2 27 22 - 32 mmol/L   Glucose, Bld 166 (H) 70 - 99 mg/dL   BUN 17 6 - 20 mg/dL   Creatinine 0.92 0.61 - 1.24 mg/dL   Calcium 8.5 (L) 8.9 - 10.3 mg/dL   Total Protein 7.0 6.5 - 8.1 g/dL   Albumin 2.5 (L) 3.5 - 5.0 g/dL   AST 12 (L) 15 - 41 U/L   ALT 11 0 - 44 U/L   Alkaline Phosphatase 75 38 - 126 U/L   Total Bilirubin 0.2 (L) 0.3 - 1.2 mg/dL   GFR, Est Non Af Am >60 >60 mL/min   GFR, Est AFR Am >60 >60 mL/min   Anion gap 8 5 - 15  CBC with Differential/Platelet  Result Value Ref Range   WBC 12.5 (H) 4.0 - 10.5 K/uL   RBC 4.63 4.22 - 5.81 MIL/uL   Hemoglobin 11.0 (L) 13.0 - 17.0 g/dL   HCT 34.7 (L) 39.0 - 52.0 %   MCV 74.9 (L) 80.0 - 100.0 fL   MCH 23.8 (L) 26.0 - 34.0 pg   MCHC 31.7 30.0 - 36.0 g/dL   RDW 16.2 (H) 11.5 - 15.5 %   Platelets 338 150 - 400 K/uL   nRBC 0.0 0.0 - 0.2 %   Neutrophils Relative % 77 %   Neutro Abs 9.7 (H) 1.7 - 7.7 K/uL   Lymphocytes Relative 8 %   Lymphs Abs 1.0 0.7 - 4.0 K/uL   Monocytes Relative 13 %   Monocytes Absolute 1.7 (H) 0.1 - 1.0 K/uL   Eosinophils Relative 0 %   Eosinophils Absolute 0.0 0.0 - 0.5 K/uL   Basophils Relative 1 %   Basophils Absolute 0.1 0.0 - 0.1 K/uL   Immature Granulocytes 1 %   Abs Immature Granulocytes 0.06 0.00 - 0.07 K/uL    EKG EKG Interpretation  Date/Time:  Thursday September 03 2018 15:17:39 EDT Ventricular Rate:  86 PR Interval:    QRS Duration: 84 QT Interval:  364 QTC Calculation: 436 R Axis:   102 Text Interpretation:  Sinus rhythm Probable left atrial enlargement Anteroseptal infarct, age indeterminate Baseline wander T wave abnormality Abnormal ekg Confirmed by Carmin Muskrat [phone]) on 09/03/2018 3:25:50 PM   Radiology Dg  Cervical Spine Complete  Result Date: 09/03/2018 CLINICAL DATA:  Soft tissue prominence right periauricular region. Multiple  myeloma. EXAM: CERVICAL SPINE - COMPLETE 4+ VIEW COMPARISON:  None. FINDINGS: Frontal, lateral, open-mouth odontoid, and bilateral oblique views were obtained. There is no evident fracture or spondylolisthesis. Prevertebral soft tissues and predental space regions are normal. There is of moderate disc space narrowing at C3-4 and C5-6. There is more severe disc space narrowing at C6-7. There is facet hypertrophy with exit foraminal narrowing at C5-6, C6-7, and C7-T1 bilaterally. There are no blastic or lytic bone lesions. No soft tissue masses are evident by radiography. There is reversal of lordotic curvature. Visualized upper lung regions are clear.  Bilaterally IMPRESSION: Multilevel osteoarthritic change. Reversal of lordotic curvature is likely indicative of muscle spasm. No fracture or spondylolisthesis. No blastic or lytic lesions. No soft tissue lesions are evident by radiography. If there remains concern for soft tissue mass, MR would be the imaging study of choice to further evaluate. Electronically Signed   By: Lowella Grip III M.D.   On: 09/03/2018 14:13    Procedures Procedures (including critical care time)  Medications Ordered in ED Medications  sodium chloride 0.9 % bolus 500 mL (500 mLs Intravenous New Bag/Given 09/03/18 1447)  iopamidol (ISOVUE-370) 76 % injection 100 mL (has no administration in time range)  sodium chloride 0.9 % injection (has no administration in time range)  iopamidol (ISOVUE-370) 76 % injection (has no administration in time range)     Initial Impression / Assessment and Plan / ED Course  I have reviewed the triage vital signs and the nursing notes.  Pertinent labs & imaging results that were available during my care of the patient were reviewed by me and considered in my medical decision making (see chart for details).     57 y.o. male with a history of COPD, CAD, multiple myeloma currently undergoing chemotherapy by Dr. Irene Limbo of oncology who presents  the emergent department today with shortness of breath.  Patient reports on Friday he started developing subjective fevers, chills, sob, doe, and a productive cough with brown sputum that is blood tinged. He was seen in his oncologists office today and was noted to have tachycardia of the low 100's, and an oxygen saturation of 92%. There was concern for PE vs PNA so he was sent here for a CTA. He reports chest pain only when coughing. No exertional chest pain.   Vital signs are reassuring on presentation.  He is without fever, tachypnea, hypoxia or hypotension.  He is placed on oxygen for comfort.  He did have mild tachycardia that is since resolved.  Patient's EKG with sinus rhythm and nonspecific ST changes.  No STEMI.  Troponin within normal limits.  He denies any exertional chest pain.  Lactic acid within normal limits.  No evidence of sepsis.  Patient CBC from cancer center with evidence of leukocytosis of 12,500.  CMP overall reassuring.  Patient does have x-ray requested by oncologist.  He has a small lymph node versus lesion to the left upper neck.  No cervical spine tenderness palpation or step-offs.  No meningeal signs.  X-ray is with reversal of lordotic curvature, without evidence of blastic or lytic lesions.    With CTA pending, case signed out to [facility], PA-C.   Final Clinical Impressions(s) / ED Diagnoses   Final diagnoses:  SOB (shortness of breath)    ED Discharge Orders    None       Dub Maclellan, Legrand Como  M, PA-C 09/03/18 1529    Carmin Muskrat, MD 09/05/18 1209

## 2018-09-04 ENCOUNTER — Telehealth: Payer: Self-pay

## 2018-09-04 DIAGNOSIS — J471 Bronchiectasis with (acute) exacerbation: Secondary | ICD-10-CM

## 2018-09-04 LAB — COMPREHENSIVE METABOLIC PANEL
ALT: 12 U/L (ref 0–44)
ANION GAP: 5 (ref 5–15)
AST: 14 U/L — ABNORMAL LOW (ref 15–41)
Albumin: 2.1 g/dL — ABNORMAL LOW (ref 3.5–5.0)
Alkaline Phosphatase: 44 U/L (ref 38–126)
BILIRUBIN TOTAL: 0.6 mg/dL (ref 0.3–1.2)
BUN: 10 mg/dL (ref 6–20)
CHLORIDE: 103 mmol/L (ref 98–111)
CO2: 27 mmol/L (ref 22–32)
Calcium: 7.5 mg/dL — ABNORMAL LOW (ref 8.9–10.3)
Creatinine, Ser: 0.82 mg/dL (ref 0.61–1.24)
Glucose, Bld: 73 mg/dL (ref 70–99)
POTASSIUM: 3.9 mmol/L (ref 3.5–5.1)
Sodium: 135 mmol/L (ref 135–145)
TOTAL PROTEIN: 5.3 g/dL — AB (ref 6.5–8.1)

## 2018-09-04 LAB — CBC
HEMATOCRIT: 30.4 % — AB (ref 39.0–52.0)
Hemoglobin: 9.2 g/dL — ABNORMAL LOW (ref 13.0–17.0)
MCH: 23.4 pg — ABNORMAL LOW (ref 26.0–34.0)
MCHC: 30.3 g/dL (ref 30.0–36.0)
MCV: 77.2 fL — AB (ref 80.0–100.0)
NRBC: 0 % (ref 0.0–0.2)
PLATELETS: 369 10*3/uL (ref 150–400)
RBC: 3.94 MIL/uL — ABNORMAL LOW (ref 4.22–5.81)
RDW: 16.5 % — AB (ref 11.5–15.5)
WBC: 9.1 10*3/uL (ref 4.0–10.5)

## 2018-09-04 LAB — STREP PNEUMONIAE URINARY ANTIGEN: Strep Pneumo Urinary Antigen: NEGATIVE

## 2018-09-04 MED ORDER — ENSURE ENLIVE PO LIQD
237.0000 mL | Freq: Three times a day (TID) | ORAL | Status: DC
  Administered 2018-09-04 – 2018-09-05 (×5): 237 mL via ORAL

## 2018-09-04 NOTE — Progress Notes (Signed)
PT demonstrates hands on understanding of Flutter device. Productive cough at this time.

## 2018-09-04 NOTE — Progress Notes (Signed)
PT demonstrated hands on understanding of Flutter device. Productive cough at this time. 

## 2018-09-04 NOTE — Progress Notes (Signed)
Initial Nutrition Assessment  DOCUMENTATION CODES:   Underweight, Severe malnutrition in context of chronic illness  INTERVENTION:   Ensure Enlive po TID, each supplement provides 350 kcal and 20 grams of protein  NUTRITION DIAGNOSIS:   Severe Malnutrition related to chronic illness, cancer and cancer related treatments as evidenced by energy intake < or equal to 75% for > or equal to 1 month, percent weight loss, severe fat depletion, severe muscle depletion.  GOAL:   Patient will meet greater than or equal to 90% of their needs  MONITOR:   PO intake, Supplement acceptance, Weight trends, Labs  REASON FOR ASSESSMENT:   Malnutrition Screening Tool    ASSESSMENT:   Patient with PMH significant for pulmonary hypertension, chronic left-sided hydropneumothorax, bronchiectasis, and multiple myeloma currently undergoing chemotherapy. Presents this admission with community acquired PAN and bronchiectasis.    Pt denies having a loss in appetite PTA. States he typically eats one meal daily that consist of bread or oatmeal. He does not eat much meat because he is afraid it will increase his blood pressure. Pt does not use supplementation at home. He endorses having issues with swallowing and food often feels like it gets stuck in his esophagus. Will report this to RN. He is currently on a regular diet and consumed 25% of breakfast this morning. Discussed the importance of protein intake for preservation of lean body mass. Educated pt on supplementation and which products he may benefit from.   Pt reports a UBW of 91 lb and a recent wt loss of unknown amount. Records indicate pt weighed 90 lb on 07/23/18 and 80 lb this admission (11.1% wt loss in 1.5 months, significant for time frame). Nutrition-Focused physical exam completed.   Note: patient may benefit from outpatient oncology RD visits as he looks to be chronically malnourished.   Medications reviewed and include: NS @ 10 ml/hr Labs  reviewed: corrected calcium 9 (wdl)  NUTRITION - FOCUSED PHYSICAL EXAM:    Most Recent Value  Orbital Region  Severe depletion  Upper Arm Region  Severe depletion  Thoracic and Lumbar Region  Severe depletion  Buccal Region  Severe depletion  Temple Region  Severe depletion  Clavicle Bone Region  Severe depletion  Clavicle and Acromion Bone Region  Severe depletion  Scapular Bone Region  Severe depletion  Dorsal Hand  Severe depletion  Patellar Region  Severe depletion  Anterior Thigh Region  Severe depletion  Posterior Calf Region  Severe depletion  Edema (RD Assessment)  None     Diet Order:   Diet Order            Diet regular Room service appropriate? Yes; Fluid consistency: Thin  Diet effective now              EDUCATION NEEDS:   Education needs have been addressed  Skin:  Skin Assessment: Reviewed RN Assessment  Last BM:  PTA  Height:   Ht Readings from Last 1 Encounters:  09/03/18 5' (1.524 m)    Weight:   Wt Readings from Last 1 Encounters:  09/03/18 36.5 kg    Ideal Body Weight:  48.2 kg  BMI:  Body mass index is 15.72 kg/m.  Estimated Nutritional Needs:   Kcal:  1300-1500 kcal  Protein:  65-80 grams  Fluid:  >/= 1.3 L/day   Mariana Single RD, LDN Clinical Nutrition Pager # - [phone]

## 2018-09-04 NOTE — Telephone Encounter (Signed)
Per 10/31 los patient is aware of changes with appointments, and he will f/u on 11/20 as prescheduled

## 2018-09-04 NOTE — Progress Notes (Signed)
[facility]  PROGRESS NOTE  [name] [name] PXT:[id number] DOB: [birth year] DOA: 09/03/2018 PCP: [name], PA-C   Brief HPI:   57 year old male with a history of primary hypertension, chronic left-sided hydropneumothorax, bronchiectasis, multiple myeloma undergoing chemotherapy with oncology.  Came with shortness of breath.  Found to have community-acquired pneumonia.  Started on ceftriaxone and Zithromax.    Subjective   This morning patient is breathing better.   Assessment/Plan:     1. Community-acquired pneumonia-slow improvement, confirmed on CTA chest.  Urinary strep pneumo antigen pending, sputum culture is pending.  No blood cultures obtained in the ED.  Will obtain blood cultures x 2  2. Bronchiectasis-continue DuoNeb every 6 hours, Mucinex 1 tablet p.o. twice daily, flutter valve.  Patient is now coughing up phlegm.  3. Multiple myeloma-patient on Velcade, Decadron per oncology.  Continue acyclovir prophylaxis       CBC: Recent Labs  Lab 09/03/18 1033 09/03/18 2040 09/04/18 0614  WBC 12.5* 11.6* 9.1  NEUTROABS 9.7*  --   --   HGB 11.0* 9.7* 9.2*  HCT 34.7* 31.5* 30.4*  MCV 74.9* 76.3* 77.2*  PLT 338 311 627    Basic Metabolic Panel: Recent Labs  Lab 09/03/18 1033 09/03/18 2040 09/04/18 0614  NA 137  --  135  K 3.7  --  3.9  CL 102  --  103  CO2 27  --  27  GLUCOSE 166*  --  73  BUN 17  --  10  CREATININE 0.92 0.78 0.82  CALCIUM 8.5*  --  7.5*     DVT prophylaxis: Lovenox  Code Status: Full code  Family Communication: No family at bedside  Disposition Plan: likely home when medically ready for discharge   Consultants:    Procedures:     Antibiotics:   Anti-infectives (From admission, onward)   Start     Dose/Rate Route Frequency Ordered Stop   09/04/18 1800  cefTRIAXone (ROCEPHIN) 1 g in sodium chloride 0.9 % 100 mL IVPB     1 g 200 mL/hr over 30 Minutes Intravenous Every 24 hours 09/03/18 2007     09/04/18 1800   azithromycin (ZITHROMAX) 500 mg in sodium chloride 0.9 % 250 mL IVPB     500 mg 250 mL/hr over 60 Minutes Intravenous Every 24 hours 09/03/18 2007     09/04/18 1000  acyclovir (ZOVIRAX) tablet 400 mg     400 mg Oral Daily 09/03/18 2007     09/03/18 1715  cefTRIAXone (ROCEPHIN) 1 g in sodium chloride 0.9 % 100 mL IVPB     1 g 200 mL/hr over 30 Minutes Intravenous  Once 09/03/18 1713 09/03/18 1809   09/03/18 1715  azithromycin (ZITHROMAX) 500 mg in sodium chloride 0.9 % 250 mL IVPB     500 mg 250 mL/hr over 60 Minutes Intravenous  Once 09/03/18 1713 09/03/18 1914       Objective   Vitals:   09/03/18 2010 09/03/18 2020 09/04/18 0457 09/04/18 0458  BP: 106/66  (!) 88/62 91/62  Pulse: 81  72   Resp: 16  16   Temp: 98.2 F (36.8 C)  (!) 97.4 F (36.3 C)   TempSrc: Oral  Oral   SpO2: 100% 100% 100%   Weight: 36.5 kg     Height: 5' (1.524 m)       Intake/Output Summary (Last 24 hours) at 09/04/2018 0840 Last data filed at 09/04/2018 0810 Gross per 24 hour  Intake 1110.59 ml  Output 400 ml  Net 710.59 ml   Filed Weights   09/03/18 2010  Weight: 36.5 kg     Physical Examination:    General: Appears in no acute distress  Cardiovascular: S1-S2, regular, no murmurs auscultated  Respiratory: Bilateral rhonchi auscultated at lung bases  Abdomen: Soft, nontender, no organomegaly  Extremities: No edema in the lower extremities  Neurologic: Alert, oriented x3, no focal deficit noted.     Data Reviewed: I have personally reviewed following labs and imaging studies   No results found for this or any previous visit (from the past 240 hour(s)).   Liver Function Tests: Recent Labs  Lab 09/03/18 1033 09/04/18 0614  AST 12* 14*  ALT 11 12  ALKPHOS 75 44  BILITOT 0.2* 0.6  PROT 7.0 5.3*  ALBUMIN 2.5* 2.1*   No results for input(s): LIPASE, AMYLASE in the last 168 hours. No results for input(s): AMMONIA in the last 168 hours.  Cardiac Enzymes: No results for  input(s): CKTOTAL, CKMB, CKMBINDEX, TROPONINI in the last 168 hours. BNP (last 3 results) Recent Labs    01/26/18 1445 01/26/18 1900  BNP 465.9* 456.2*    ProBNP (last 3 results) No results for input(s): PROBNP in the last 8760 hours.    Studies: Dg Cervical Spine Complete  Result Date: 09/03/2018 CLINICAL DATA:  Soft tissue prominence right periauricular region. Multiple myeloma. EXAM: CERVICAL SPINE - COMPLETE 4+ VIEW COMPARISON:  None. FINDINGS: Frontal, lateral, open-mouth odontoid, and bilateral oblique views were obtained. There is no evident fracture or spondylolisthesis. Prevertebral soft tissues and predental space regions are normal. There is of moderate disc space narrowing at C3-4 and C5-6. There is more severe disc space narrowing at C6-7. There is facet hypertrophy with exit foraminal narrowing at C5-6, C6-7, and C7-T1 bilaterally. There are no blastic or lytic bone lesions. No soft tissue masses are evident by radiography. There is reversal of lordotic curvature. Visualized upper lung regions are clear.  Bilaterally IMPRESSION: Multilevel osteoarthritic change. Reversal of lordotic curvature is likely indicative of muscle spasm. No fracture or spondylolisthesis. No blastic or lytic lesions. No soft tissue lesions are evident by radiography. If there remains concern for soft tissue mass, MR would be the imaging study of choice to further evaluate. Electronically Signed   By: Lowella Grip III M.D.   On: 09/03/2018 14:13   Ct Angio Chest Pe W/cm &/or Wo Cm  Result Date: 09/03/2018 CLINICAL DATA:  Angina Dyspnea Best images possible due to patient cooperation/condition EXAM: CT ANGIOGRAPHY CHEST WITH CONTRAST TECHNIQUE: Multidetector CT imaging of the chest was performed using the standard protocol during bolus administration of intravenous contrast. Multiplanar CT image reconstructions and MIPs were obtained to evaluate the vascular anatomy. CONTRAST:  134m ISOVUE-370  IOPAMIDOL (ISOVUE-370) INJECTION 76% COMPARISON:  02/07/2018 and previous FINDINGS: Cardiovascular: Heart size normal. Massive dilatation of central pulmonary arteries. No convincing filling defects to suggest acute PE, although there is significant motion degradation throughout the examination limiting peripheral evaluation. Adequate contrast opacification of the thoracic aorta with no evidence of dissection, aneurysm, or stenosis. There is classic 3-vessel brachiocephalic arch anatomy without proximal stenosis. Mediastinum/Nodes: No hilar or mediastinal adenopathy. Lungs/Pleura: Loculated left pleural gas and fluid collections laterally and at the left lung base with associated pleural thickening and coarse calcifications. Stable consolidation in the adjacent left lung base. Partially calcified pleural plaques bilaterally. Bronchiectasis in the right lower lobe with partial airway occlusion by secretions in some increase in patchy airspace consolidation most marked in the posterior basal segment  right lower lobe. Upper Abdomen: No acute findings. Musculoskeletal: No acute findings. Detail is significantly degraded by continued patient motion during the acquisition. Review of the MIP images confirms the above findings. IMPRESSION: 1. Negative for acute PE or thoracic aortic dissection, with limitations as above. 2. Chronic massive dilatation of central pulmonary arteries consistent with pulmonary arterial hypertension. 3. Bronchiectasis with interval increase in central airway secretions and airspace infiltrates in the right lower lobe. 4. Chronic loculated left pleural gas and fluid. Electronically Signed   By: Lucrezia Europe M.D.   On: 09/03/2018 15:41    Scheduled Meds: . acyclovir  400 mg Oral Daily  . aspirin EC  81 mg Oral Daily  . docusate sodium  50 mg Oral Daily  . enoxaparin (LOVENOX) injection  30 mg Subcutaneous Q24H  . guaiFENesin  1,200 mg Oral BID  . ipratropium-albuterol  3 mL Nebulization Once   . ipratropium-albuterol  3 mL Nebulization TID  . nortriptyline  25 mg Oral QHS  . pantoprazole  40 mg Oral Daily    Admission status:Inpatient: Based on patients clinical presentation and evaluation of above clinical data, I have made determination that patient meets Inpatient criteria at this time.  Patient requiring IV antibiotics for pneumonia.  Time spent: 20 minutes  [facility] Pager [phone]. If 7PM-7AM, please contact night-coverage at [email or web address], Office  [phone]  password [email or web address]  09/04/2018, 8:40 AM  LOS: 1 day

## 2018-09-04 NOTE — Progress Notes (Signed)
PT demonstrated hands on understanding of Flutter device. Non productive cough at this time. 

## 2018-09-04 NOTE — Progress Notes (Signed)
  Patient Saturations on Room Air at Rest = 96%  Patient Saturations on [employer] while Ambulating = 93-94%

## 2018-09-05 DIAGNOSIS — E43 Unspecified severe protein-calorie malnutrition: Secondary | ICD-10-CM

## 2018-09-05 DIAGNOSIS — J181 Lobar pneumonia, unspecified organism: Secondary | ICD-10-CM

## 2018-09-05 MED ORDER — LEVOFLOXACIN 750 MG PO TABS: 750.0000 mg | ORAL_TABLET | Freq: Every day | ORAL | 0 refills | Status: AC

## 2018-09-05 MED ORDER — GUAIFENESIN ER 600 MG PO TB12: 1200.0000 mg | ORAL_TABLET | Freq: Two times a day (BID) | ORAL | 0 refills | Status: DC

## 2018-09-05 NOTE — Progress Notes (Signed)
Discharge instructions given to pt and all questions were answered. Pt taken down via wheel chair and was picked up by his caregiver.

## 2018-09-05 NOTE — Discharge Summary (Addendum)
Physician Discharge Summary  [name] [name] ZYS:[id number] DOB: May 19, 1961 DOA: 09/03/2018  PCP: [name], PA-C  Admit date: 09/03/2018 Discharge date: 09/05/2018  Time spent: 30  minutes  Recommendations for Outpatient Follow-up:  1. Take Levaquin 750 mg po daily x 4 days   Discharge Diagnoses:  Active Problems:   CAP (community acquired pneumonia)   Protein-calorie malnutrition, severe   Discharge Condition: Stable  Diet recommendation: Regular diet  Filed Weights   09/03/18 2010  Weight: 36.5 kg    History of present illness:  57 year old male with a history of primary hypertension, chronic left-sided hydropneumothorax, bronchiectasis, multiple myeloma undergoing chemotherapy with oncology.  Came with shortness of breath.  Found to have community-acquired pneumonia.  Started on ceftriaxone and Zithromax.   Hospital Course: \  1. Community-acquired pneumonia- improved, not requiring oxygen,  confirmed on CTA chest.  Urinary strep pneumo antigen negativ.   Blood cultures x 2 is negative to date. Will discharge on four more days of antibiotics. Levaquin 750 mg po daily x 4 days.  2. Bronchiectasis-continue Mucinex 1 tablet p.o. twice daily.  Patient is now coughing up phlegm. Trelegy ellipta, prn albuterol.  3. Multiple myeloma-patient on Velcade, Decadron per oncology.  Continue acyclovir prophylaxis   Procedures:  None   Consultations:  None    Discharge Exam: Vitals:   09/05/18 0503 09/05/18 0717  BP: 134/83   Pulse: 86   Resp: (!) 21   Temp: 98.5 F (36.9 C)   SpO2: 91% 91%    General: Appears in  No acute distress Cardiovascular: S1S2 RRR Respiratory: Clear bilaterally  Discharge Instructions   Discharge Instructions    Diet - low sodium heart healthy   Complete by:  As directed    Increase activity slowly   Complete by:  As directed      Allergies as of 09/05/2018   No Known Allergies     Medication List    STOP taking these  medications   ELAVIL 25 MG tablet Generic drug:  amitriptyline   potassium chloride SA 20 MEQ tablet Commonly known as:  K-DUR,KLOR-CON   umeclidinium-vilanterol 62.5-25 MCG/INH Aepb Commonly known as:  ANORO ELLIPTA     TAKE these medications   acyclovir 400 MG tablet Commonly known as:  ZOVIRAX Take 1 tablet (400 mg total) by mouth daily.   albuterol 108 (90 Base) MCG/ACT inhaler Commonly known as:  PROVENTIL HFA;VENTOLIN HFA Inhale 2 puffs into the lungs every 6 (six) hours as needed for wheezing or shortness of breath.   aspirin EC 81 MG tablet Take 1 tablet (81 mg total) by mouth daily.   dexamethasone 4 MG tablet Commonly known as:  DECADRON TAKE 5 TABLETS (20 MG TOTAL) BY MOUTH ONCE A WEEK.   docusate sodium 50 MG capsule Commonly known as:  COLACE Take 1 capsule (50 mg total) by mouth daily.   Fluticasone-Umeclidin-Vilant 100-62.5-25 MCG/INH Aepb Inhale 1 puff into the lungs daily.   guaiFENesin 600 MG 12 hr tablet Commonly known as:  MUCINEX Take 2 tablets (1,200 mg total) by mouth 2 (two) times daily.   lenalidomide 15 MG capsule Commonly known as:  REVLIMID Take 1 capsule (15 mg total) by mouth daily. Authorization#[id number] 09/01/18   levofloxacin 750 MG tablet Commonly known as:  LEVAQUIN Take 1 tablet (750 mg total) by mouth daily for 4 days.   nortriptyline 25 MG capsule Commonly known as:  PAMELOR Take 1 capsule (25 mg total) by mouth at bedtime. What changed:  Another  medication with the same name was removed. Continue taking this medication, and follow the directions you see here.   omeprazole 40 MG capsule Commonly known as:  PRILOSEC Take 1 capsule (40 mg total) by mouth daily.   ondansetron 8 MG tablet Commonly known as:  ZOFRAN Take 1 tablet (8 mg total) by mouth every 8 (eight) hours as needed for nausea.   oxyCODONE-acetaminophen 5-325 MG tablet Commonly known as:  PERCOCET/ROXICET Take 1 tablet by mouth every 6 (six) hours as needed  for severe pain.   OXYGEN 2lpm BEDTIME ONLY      No Known Allergies    The results of significant diagnostics from this hospitalization (including imaging, microbiology, ancillary and laboratory) are listed below for reference.    Significant Diagnostic Studies: Dg Cervical Spine Complete  Result Date: 09/03/2018 CLINICAL DATA:  Soft tissue prominence right periauricular region. Multiple myeloma. EXAM: CERVICAL SPINE - COMPLETE 4+ VIEW COMPARISON:  None. FINDINGS: Frontal, lateral, open-mouth odontoid, and bilateral oblique views were obtained. There is no evident fracture or spondylolisthesis. Prevertebral soft tissues and predental space regions are normal. There is of moderate disc space narrowing at C3-4 and C5-6. There is more severe disc space narrowing at C6-7. There is facet hypertrophy with exit foraminal narrowing at C5-6, C6-7, and C7-T1 bilaterally. There are no blastic or lytic bone lesions. No soft tissue masses are evident by radiography. There is reversal of lordotic curvature. Visualized upper lung regions are clear.  Bilaterally IMPRESSION: Multilevel osteoarthritic change. Reversal of lordotic curvature is likely indicative of muscle spasm. No fracture or spondylolisthesis. No blastic or lytic lesions. No soft tissue lesions are evident by radiography. If there remains concern for soft tissue mass, MR would be the imaging study of choice to further evaluate. Electronically Signed   By: Lowella Grip III M.D.   On: 09/03/2018 14:13   Ct Angio Chest Pe W/cm &/or Wo Cm  Result Date: 09/03/2018 CLINICAL DATA:  Angina Dyspnea Best images possible due to patient cooperation/condition EXAM: CT ANGIOGRAPHY CHEST WITH CONTRAST TECHNIQUE: Multidetector CT imaging of the chest was performed using the standard protocol during bolus administration of intravenous contrast. Multiplanar CT image reconstructions and MIPs were obtained to evaluate the vascular anatomy. CONTRAST:  177m  ISOVUE-370 IOPAMIDOL (ISOVUE-370) INJECTION 76% COMPARISON:  02/07/2018 and previous FINDINGS: Cardiovascular: Heart size normal. Massive dilatation of central pulmonary arteries. No convincing filling defects to suggest acute PE, although there is significant motion degradation throughout the examination limiting peripheral evaluation. Adequate contrast opacification of the thoracic aorta with no evidence of dissection, aneurysm, or stenosis. There is classic 3-vessel brachiocephalic arch anatomy without proximal stenosis. Mediastinum/Nodes: No hilar or mediastinal adenopathy. Lungs/Pleura: Loculated left pleural gas and fluid collections laterally and at the left lung base with associated pleural thickening and coarse calcifications. Stable consolidation in the adjacent left lung base. Partially calcified pleural plaques bilaterally. Bronchiectasis in the right lower lobe with partial airway occlusion by secretions in some increase in patchy airspace consolidation most marked in the posterior basal segment right lower lobe. Upper Abdomen: No acute findings. Musculoskeletal: No acute findings. Detail is significantly degraded by continued patient motion during the acquisition. Review of the MIP images confirms the above findings. IMPRESSION: 1. Negative for acute PE or thoracic aortic dissection, with limitations as above. 2. Chronic massive dilatation of central pulmonary arteries consistent with pulmonary arterial hypertension. 3. Bronchiectasis with interval increase in central airway secretions and airspace infiltrates in the right lower lobe. 4. Chronic loculated left pleural  gas and fluid. Electronically Signed   By: Lucrezia Europe M.D.   On: 09/03/2018 15:41   Mr Lumbar Spine W Wo Contrast  Result Date: 09/01/2018 CLINICAL DATA:  Multiple myeloma.  Evaluate L4 lytic lesion. EXAM: MRI LUMBAR SPINE WITHOUT AND WITH CONTRAST TECHNIQUE: Multiplanar and multiecho pulse sequences of the lumbar spine were obtained  without and with intravenous contrast. CONTRAST:  4 cc Gadavist intravenous COMPARISON:  Head CT 10/21/2017.  Abdominal CT 01/23/2018 FINDINGS: Segmentation:  5 lumbar type vertebral bodies Alignment: Facet mediated degenerative anterolisthesis at L4-5, grade 1 Vertebrae: L4 body lucent area on prior CT is readily identified and associated with a defect in the L4 superior endplate with visible downward herniation of disc material on sagittal STIR imaging. There is reactive marrow fatty change. Similar findings affect the L3 inferior endplate to a lesser degree. No evident progression since CT 01/23/2018, the L4 finding report measures 14 mm. There is mild peripheral enhancement at the level of L4 "lesion", nonspecific. No aggressive bone lesion. No acute fracture. Conus medullaris and cauda equina: Conus extends to the L1-2 level. Conus and cauda equina appear normal. There is minimal fat deposition within the filum terminalis. Paraspinal and other soft tissues: Small renal cystic intensities. Disc levels: T12- L1: Unremarkable. L1-L2: Mild disc narrowing.  Anterior annular fissure. L2-L3: Mild disc bulging.  No impingement L3-L4: Disc narrowing and bulging with moderate bilateral foraminal narrowing. Patent spinal canal L4-L5: Advanced facet arthropathy with spurring and anterolisthesis. The disc is narrowed and bulging with posterior annular fissure. Moderate to advanced spinal stenosis. L5 compression in the bilateral subarticular recesses. Bilateral L4 foraminal impingement L5-S1:Posterior annular fissures and mild disc bulging. Negative facets. IMPRESSION: 1. 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 No definite myeloma. 2. Degenerative disease causes canal and biforaminal impingement at L4-5. Electronically Signed   By: Monte Fantasia M.D.   On: 09/01/2018 13:38   Ir Radiologist Eval & Mgmt  Result Date: 09/01/2018 Please refer to notes tab for details about interventional  procedure. (Op Note)   Microbiology: Recent Results (from the past 240 hour(s))  Culture, blood (Routine X 2) w Reflex to ID Panel     Status: None (Preliminary result)   Collection Time: 09/04/18  9:36 AM  Result Value Ref Range Status   Specimen Description   Final    BLOOD BLOOD LEFT ARM Performed at [facility] [address], [place], [place] [address]    Special Requests   Final    NONE AEROBIC BOTTLE ONLY Performed at [facility] [address] [place], [place] [address]    Culture   Final    NO GROWTH < 24 HOURS Performed at [facility] [address], [place], [place] [address]    Report Status PENDING  Incomplete  Culture, blood (Routine X 2) w Reflex to ID Panel     Status: None (Preliminary result)   Collection Time: 09/04/18  9:36 AM  Result Value Ref Range Status   Specimen Description   Final    BLOOD BLOOD RIGHT HAND Performed at [facility] [address] [place], [place] [address]    Special Requests   Final    AEROBIC BOTTLE ONLY Performed at [facility] [address] [place], [place] [address]    Culture   Final    NO GROWTH < 24 HOURS Performed at [facility] [place],  [place] [address]    Report Status PENDING  Incomplete     Labs: Basic Metabolic Panel: Recent Labs  Lab 09/03/18 1033 09/03/18 2040 09/04/18 0614  NA 137  --  135  K 3.7  --  3.9  CL 102  --  103  CO2 27  --  27  GLUCOSE 166*  --  73  BUN 17  --  10  CREATININE 0.92 0.78 0.82  CALCIUM 8.5*  --  7.5*   Liver Function Tests: Recent Labs  Lab 09/03/18 1033 09/04/18 0614  AST 12* 14*  ALT 11 12  ALKPHOS 75 44  BILITOT 0.2* 0.6  PROT 7.0 5.3*  ALBUMIN 2.5* 2.1*   No results for input(s): LIPASE, AMYLASE in the last 168 hours. No results for input(s): AMMONIA in the last 168 hours. CBC: Recent Labs  Lab 09/03/18 1033 09/03/18 2040 09/04/18 0614   WBC 12.5* 11.6* 9.1  NEUTROABS 9.7*  --   --   HGB 11.0* 9.7* 9.2*  HCT 34.7* 31.5* 30.4*  MCV 74.9* 76.3* 77.2*  PLT 338 311 369   Cardiac Enzymes: No results for input(s): CKTOTAL, CKMB, CKMBINDEX, TROPONINI in the last 168 hours. BNP: BNP (last 3 results) Recent Labs    01/26/18 1445 01/26/18 1900  BNP 465.9* 456.2*        Signed:  Oswald Hillock MD.  [facility] 09/05/2018, 12:14 PM

## 2018-09-07 ENCOUNTER — Telehealth: Payer: Self-pay | Admitting: *Deleted

## 2018-09-07 MED FILL — levoFLOXacin 750 MG TABS: 750 | 4 days supply | Qty: 4 | Fill #0

## 2018-09-07 NOTE — Telephone Encounter (Signed)
Called office to ask which medicine Dr. Irene Limbo wanted him to stop taking when he was here in the office last week. Per Plan Of Care from 10/31 appt, Dr. Irene Limbo stated he should stop Revlimid at home. Information relayed through friend Anderson Malta.

## 2018-09-09 LAB — CULTURE, BLOOD (ROUTINE X 2)
CULTURE: NO GROWTH
Culture: NO GROWTH

## 2018-09-10 ENCOUNTER — Other Ambulatory Visit: Payer: Self-pay

## 2018-09-10 ENCOUNTER — Ambulatory Visit: Payer: Self-pay

## 2018-09-10 MED FILL — DEXAMETHASONE 4 MG TABLET: 4 | 28 days supply | Qty: 20 | Fill #1

## 2018-09-17 ENCOUNTER — Other Ambulatory Visit: Payer: Self-pay

## 2018-09-17 ENCOUNTER — Encounter: Payer: Self-pay | Admitting: Nutrition

## 2018-09-17 ENCOUNTER — Ambulatory Visit: Payer: Self-pay

## 2018-09-22 NOTE — Progress Notes (Signed)
HEMATOLOGY/ONCOLOGY CONSULTATION NOTE  Date of Service: 09/23/2018  Patient Care Team: Tawny Asal as PCP - General (Physician Assistant) Michel Bickers, MD as Consulting Physician (Infectious Diseases) Brand Males, MD as Consulting Physician (Pulmonary Disease)  CHIEF COMPLAINTS/PURPOSE OF CONSULTATION:  Multiple myeloma  Oncologic History:  57 y.o.  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  Based on the diagnosis, patient was started on treatment with bortezomib & low-dose dexamethasone.  Initially, lenalidomide was omitted out of concern for high risk of for infectious complications.  We have continued to therapy with just a doublet treatment and did not reintensify the therapy.  Treatment course has been complicated by exacerbation of pulmonary hypertension and now with shingles reactivation resulting in blistering rash and skin ulcerations on the left flank.  Infection appears to be responding to higher dose of acyclovir and skin is healing.   HISTORY OF PRESENTING ILLNESS:   Garnett Nunziata is a wonderful 57 y.o. male who has been referred to [place] by my colleague Dr [name] for evaluation and management of Multiple myeloma. He is accompanied today by a Optometrist. The pt reports that he is doing well overall.   The pt has been followed by my colleague Dr [name] and has been treated with Velcade and dexamethasone.    The pt reports that he has lost some weight and hasn't eaten as much because he hasn't been able to taste his food as well.   He notes that his shingles rash has continued to clear up and he denies pain associated with his remaining rash. His back pain continues and he has run out of his Hydromorphone. He notes that his back  pain has not gotten better since he was first evaluated. He notes that he feels tired and has a little SOB.   Most recent lab results (04/07/18) of CBC and CMP is as follows: all values are WNL except for Sodium at 134, chloride at 96, Total Protein at 8.7, Albumin at 3.0, Total bilirubin <0.2, WBC at 19.1k, HGB at 10.7, HCT at 33.2, MCV at 73.5, MCH at 23.7, RDW at 18.4, Platelets at 637k, ANC at 17.1k.Marland Kitchen MMP 04/07/18 showed IgG elevated at 2301 and M Protein at 1.5. Ferritin 04/07/18 was elevated at 436 Iron/TIBC 04/07/18 showed at 12% saturation ratio LDH 04/07/18 is WNL at 171 Kappa/Lambda 04/07/18 showed Kappa at 25.2 and K:L ratio at 2.27  On review of systems, pt reports some SOB, back pain, resolving shingles rash, mouth sores, weight loss, and denies fevers, chills, new skin rashes, and any other symptoms.  Interval History:   Bryn Perkin returns today for management and evaluation of his multiple myeloma. The patient's last visit with [place] was on 09/03/18. He is accompanied today by an interpreter. The pt reports that he is doing well overall.   The pt was admitted between 09/03/18 and 09/05/18 for community acquired pneumonia. The pt reports that he completed his antibiotic course and is breathing without difficulty now. He has a resolving and much improved cough and denies any phlegm production with this. He denies any fevers.   Of note since the patient's last visit, pt has had CT Angiography Chest completed on 09/03/18 with results revealing Negative for acute PE or thoracic aortic dissection, with limitations as above.  2. Chronic massive dilatation of central pulmonary arteries consistent with pulmonary arterial hypertension. 3. Bronchiectasis with interval increase in central airway secretions and airspace infiltrates in the right lower lobe. 4. Chronic loculated left pleural gas and fluid.  Lab results today (09/23/18) of CBC w/diff, CMP is as follows: all values are WNL except for HGB at 11.2, HCT  at 35.7, MCV at 76.4, MCH at 24.0, RDW at 17.5, PLT at 501k, Glucose at 69, Calcium at 8.8, Albumin at 2.8, Total Bilirubin at <0.2.  On review of systems, pt reports improved energy levels, resolving and improved cough, and denies fevers, phlegm, difficulty breathing, abdominal pains, and any other symptoms.   MEDICAL HISTORY:  Past Medical History:  Diagnosis Date  . Acute on chronic respiratory failure ([place]) 02/07/2018  . Hypertension   . Kidney stones   . Pulmonary hypertension ([place])     SURGICAL HISTORY: Past Surgical History:  Procedure Laterality Date  . CARDIAC CATHETERIZATION N/A 01/18/2016   Procedure: Right Heart Cath;  Surgeon: Jolaine Artist, MD;  Location: [facility];  Service: Cardiovascular;  Laterality: N/A;  . CARDIAC CATHETERIZATION N/A 06/24/2016   Procedure: Right Heart Cath;  Surgeon: Jolaine Artist, MD;  Location: [facility];  Service: Cardiovascular;  Laterality: N/A;  . IR RADIOLOGIST EVAL & MGMT  05/27/2018  . IR RADIOLOGIST EVAL & MGMT  09/01/2018  . KIDNEY SURGERY    . VIDEO ASSISTED THORACOSCOPY (VATS)/DECORTICATION Left 2003  . VIDEO ASSISTED THORACOSCOPY (VATS)/DECORTICATION Left 10/02/2015   Procedure: VIDEO ASSISTED THORACOSCOPY (VATS)/DECORTICATION and drainage of chronic empyena;  Surgeon: [name], MD;  Location: [facility];  Service: Thoracic;  Laterality: Left;  Marland Kitchen VIDEO BRONCHOSCOPY N/A 10/02/2015   Procedure: VIDEO BRONCHOSCOPY;  Surgeon: [name], MD;  Location[facility];  Service: Thoracic;  Laterality: N/A;    SOCIAL HISTORY: Social History   Socioeconomic History  . Marital status: Single    Spouse name: Not on file  . Number of children: Not on file  . Years of education: Not on file  . Highest education level: Not on file  Occupational History  . Not on file  Social Needs  . Financial resource strain: Not on file  . Food insecurity:    Worry: Not on file    Inability: Not on file  . Transportation  needs:    Medical: Not on file    Non-medical: Not on file  Tobacco Use  . Smoking status: Never Smoker  . Smokeless tobacco: Never Used  Substance and Sexual Activity  . Alcohol use: Yes  . Drug use: No  . Sexual activity: Not on file  Lifestyle  . Physical activity:    Days per week: Not on file    Minutes per session: Not on file  . Stress: Not on file  Relationships  . Social connections:    Talks on phone: Not on file    Gets together: Not on file    Attends religious service: Not on file    Active member of club or organization: Not on file    Attends meetings of clubs or organizations: Not on file    Relationship status: Not on file  . Intimate partner violence:    Fear of current or ex partner: Not on file    Emotionally abused: Not on file    Physically abused: Not on file    Forced sexual activity: Not on file  Other Topics Concern  . Not  on file  Social History Narrative  . Not on file    FAMILY HISTORY: Family History  Problem Relation Age of Onset  . Hypertension Other     ALLERGIES:  has No Known Allergies.  MEDICATIONS:  Current Outpatient Medications  Medication Sig Dispense Refill  . acyclovir (ZOVIRAX) 400 MG tablet Take 1 tablet (400 mg total) by mouth daily. 30 tablet 3  . albuterol (PROVENTIL HFA;VENTOLIN HFA) 108 (90 Base) MCG/ACT inhaler Inhale 2 puffs into the lungs every 6 (six) hours as needed for wheezing or shortness of breath. 1 Inhaler 2  . aspirin EC 81 MG tablet Take 1 tablet (81 mg total) by mouth daily. 60 tablet 11  . dexamethasone (DECADRON) 4 MG tablet Take 5 tablets (20 mg total) by mouth once a week. 20 tablet 3  . docusate sodium (COLACE) 50 MG capsule Take 1 capsule (50 mg total) by mouth daily. 30 capsule 0  . Fluticasone-Umeclidin-Vilant (TRELEGY ELLIPTA) 100-62.5-25 MCG/INH AEPB Inhale 1 puff into the lungs daily. 1 each 5  . guaiFENesin (MUCINEX) 600 MG 12 hr tablet Take 2 tablets (1,200 mg total) by mouth 2 (two) times  daily. 20 tablet 0  . lenalidomide (REVLIMID) 15 MG capsule Take 1 capsule (15 mg total) by mouth daily. Take 21 days on 7 days off. Authorization#[id number] 09/01/18 21 capsule 2  . nortriptyline (PAMELOR) 25 MG capsule Take 1 capsule (25 mg total) by mouth at bedtime. 30 capsule 3  . omeprazole (PRILOSEC) 40 MG capsule Take 1 capsule (40 mg total) by mouth daily. 30 capsule 2  . ondansetron (ZOFRAN) 8 MG tablet Take 1 tablet (8 mg total) by mouth every 8 (eight) hours as needed for nausea. 30 tablet 3  . oxyCODONE-acetaminophen (PERCOCET/ROXICET) 5-325 MG tablet Take 1 tablet by mouth every 6 (six) hours as needed for severe pain. 30 tablet 0  . OXYGEN 2lpm BEDTIME ONLY     No current facility-administered medications for this visit.     REVIEW OF SYSTEMS:    A 10+ POINT REVIEW OF SYSTEMS WAS OBTAINED including neurology, dermatology, psychiatry, cardiac, respiratory, lymph, extremities, GI, GU, Musculoskeletal, constitutional, breasts, reproductive, HEENT.  All pertinent positives are noted in the HPI.  All others are negative.   PHYSICAL EXAMINATION: ECOG PERFORMANCE STATUS: 2 - Symptomatic, <50% confined to bed  Vitals:   09/23/18 1052  BP: 109/84  Pulse: 99  Resp: 18  Temp: 98.3 F (36.8 C)  SpO2: 96%   Filed Weights   09/23/18 1052  Weight: 83 lb (37.6 kg)   .Body mass index is 16.21 kg/m.  GENERAL:alert, in no acute distress and comfortable SKIN: no acute rashes, no significant lesions EYES: conjunctiva are pink and non-injected, sclera anicteric OROPHARYNX: MMM, no exudates, no oropharyngeal erythema or ulceration NECK: supple, no JVD LYMPH:  no palpable lymphadenopathy in the cervical, axillary or inguinal regions LUNGS: clear to auscultation b/l with normal respiratory effort HEART: regular rate & rhythm ABDOMEN:  normoactive bowel sounds , non tender, not distended. No palpable hepatosplenomegaly.  Extremity: no pedal edema PSYCH: alert & oriented x 3 with fluent  speech NEURO: no focal motor/sensory deficits   LABORATORY DATA:  I have reviewed the data as listed  . CBC Latest Ref Rng & Units 09/23/2018 09/04/2018 09/03/2018  WBC 4.0 - 10.5 K/uL 10.5 9.1 11.6(H)  Hemoglobin 13.0 - 17.0 g/dL 11.2(L) 9.2(L) 9.7(L)  Hematocrit 39.0 - 52.0 % 35.7(L) 30.4(L) 31.5(L)  Platelets 150 - 400 K/uL 501(H) 369 311   .  CBC    Component Value Date/Time   WBC 10.5 09/23/2018 0931   RBC 4.67 09/23/2018 0931   HGB 11.2 (L) 09/23/2018 0931   HGB 10.7 (L) 04/07/2018 1429   HGB 9.5 (L) 03/05/2018 1206   HGB 13.1 11/07/2017 1014   HCT 35.7 (L) 09/23/2018 0931   HCT 32.3 (L) 03/05/2018 1206   HCT 39.7 11/07/2017 1014   PLT 501 (H) 09/23/2018 0931   PLT 637 (H) 04/07/2018 1429   PLT 474 (H) 03/05/2018 1206   MCV 76.4 (L) 09/23/2018 0931   MCV 82 03/05/2018 1206   MCV 87.6 11/07/2017 1014   MCH 24.0 (L) 09/23/2018 0931   MCHC 31.4 09/23/2018 0931   RDW 17.5 (H) 09/23/2018 0931   RDW 20.5 (H) 03/05/2018 1206   RDW 14.5 11/07/2017 1014   LYMPHSABS 1.8 09/23/2018 0931   LYMPHSABS 1.5 03/05/2018 1206   LYMPHSABS 1.7 11/07/2017 1014   MONOABS 1.0 09/23/2018 0931   MONOABS 0.8 11/07/2017 1014   EOSABS 0.1 09/23/2018 0931   EOSABS 0.4 03/05/2018 1206   BASOSABS 0.1 09/23/2018 0931   BASOSABS 0.1 03/05/2018 1206   BASOSABS 0.0 11/07/2017 1014    . CMP Latest Ref Rng & Units 09/23/2018 09/04/2018 09/03/2018  Glucose 70 - 99 mg/dL 69(L) 73 166(H)  BUN 6 - 20 mg/dL '8 10 17  ' Creatinine 0.61 - 1.24 mg/dL 0.84 0.82 0.78  Sodium 135 - 145 mmol/L 142 135 137  Potassium 3.5 - 5.1 mmol/L 3.6 3.9 3.7  Chloride 98 - 111 mmol/L 104 103 102  CO2 22 - 32 mmol/L '30 27 27  ' Calcium 8.9 - 10.3 mg/dL 8.8(L) 7.5(L) 8.5(L)  Total Protein 6.5 - 8.1 g/dL 7.3 5.3(L) 7.0  Total Bilirubin 0.3 - 1.2 mg/dL <0.2(L) 0.6 0.2(L)  Alkaline Phos 38 - 126 U/L 73 44 75  AST 15 - 41 U/L 19 14(L) 12(L)  ALT 0 - 44 U/L '11 12 11   ' 03/18/18 Cytogenetics:  03/18/18 Pathology:  11/03/17  Cytogenetics:    RADIOGRAPHIC STUDIES: I have personally reviewed the radiological images as listed and agreed with the findings in the report. Dg Cervical Spine Complete  Result Date: 09/03/2018 CLINICAL DATA:  Soft tissue prominence right periauricular region. Multiple myeloma. EXAM: CERVICAL SPINE - COMPLETE 4+ VIEW COMPARISON:  None. FINDINGS: Frontal, lateral, open-mouth odontoid, and bilateral oblique views were obtained. There is no evident fracture or spondylolisthesis. Prevertebral soft tissues and predental space regions are normal. There is of moderate disc space narrowing at C3-4 and C5-6. There is more severe disc space narrowing at C6-7. There is facet hypertrophy with exit foraminal narrowing at C5-6, C6-7, and C7-T1 bilaterally. There are no blastic or lytic bone lesions. No soft tissue masses are evident by radiography. There is reversal of lordotic curvature. Visualized upper lung regions are clear.  Bilaterally IMPRESSION: Multilevel osteoarthritic change. Reversal of lordotic curvature is likely indicative of muscle spasm. No fracture or spondylolisthesis. No blastic or lytic lesions. No soft tissue lesions are evident by radiography. If there remains concern for soft tissue mass, MR would be the imaging study of choice to further evaluate. Electronically Signed   By: Lowella Grip III M.D.   On: 09/03/2018 14:13   Ct Angio Chest Pe W/cm &/or Wo Cm  Result Date: 09/03/2018 CLINICAL DATA:  Angina Dyspnea Best images possible due to patient cooperation/condition EXAM: CT ANGIOGRAPHY CHEST WITH CONTRAST TECHNIQUE: Multidetector CT imaging of the chest was performed using the standard protocol during bolus administration of intravenous  contrast. Multiplanar CT image reconstructions and MIPs were obtained to evaluate the vascular anatomy. CONTRAST:  15m ISOVUE-370 IOPAMIDOL (ISOVUE-370) INJECTION 76% COMPARISON:  02/07/2018 and previous FINDINGS: Cardiovascular: Heart size normal.  Massive dilatation of central pulmonary arteries. No convincing filling defects to suggest acute PE, although there is significant motion degradation throughout the examination limiting peripheral evaluation. Adequate contrast opacification of the thoracic aorta with no evidence of dissection, aneurysm, or stenosis. There is classic 3-vessel brachiocephalic arch anatomy without proximal stenosis. Mediastinum/Nodes: No hilar or mediastinal adenopathy. Lungs/Pleura: Loculated left pleural gas and fluid collections laterally and at the left lung base with associated pleural thickening and coarse calcifications. Stable consolidation in the adjacent left lung base. Partially calcified pleural plaques bilaterally. Bronchiectasis in the right lower lobe with partial airway occlusion by secretions in some increase in patchy airspace consolidation most marked in the posterior basal segment right lower lobe. Upper Abdomen: No acute findings. Musculoskeletal: No acute findings. Detail is significantly degraded by continued patient motion during the acquisition. Review of the MIP images confirms the above findings. IMPRESSION: 1. Negative for acute PE or thoracic aortic dissection, with limitations as above. 2. Chronic massive dilatation of central pulmonary arteries consistent with pulmonary arterial hypertension. 3. Bronchiectasis with interval increase in central airway secretions and airspace infiltrates in the right lower lobe. 4. Chronic loculated left pleural gas and fluid. Electronically Signed   By: DLucrezia EuropeM.D.   On: 09/03/2018 15:41   Mr Lumbar Spine W Wo Contrast  Result Date: 09/01/2018 CLINICAL DATA:  Multiple myeloma.  Evaluate L4 lytic lesion. EXAM: MRI LUMBAR SPINE WITHOUT AND WITH CONTRAST TECHNIQUE: Multiplanar and multiecho pulse sequences of the lumbar spine were obtained without and with intravenous contrast. CONTRAST:  4 cc Gadavist intravenous COMPARISON:  Head CT 10/21/2017.  Abdominal CT  01/23/2018 FINDINGS: Segmentation:  5 lumbar type vertebral bodies Alignment: Facet mediated degenerative anterolisthesis at L4-5, grade 1 Vertebrae: L4 body lucent area on prior CT is readily identified and associated with a defect in the L4 superior endplate with visible downward herniation of disc material on sagittal STIR imaging. There is reactive marrow fatty change. Similar findings affect the L3 inferior endplate to a lesser degree. No evident progression since CT 01/23/2018, the L4 finding report measures 14 mm. There is mild peripheral enhancement at the level of L4 "lesion", nonspecific. No aggressive bone lesion. No acute fracture. Conus medullaris and cauda equina: Conus extends to the L1-2 level. Conus and cauda equina appear normal. There is minimal fat deposition within the filum terminalis. Paraspinal and other soft tissues: Small renal cystic intensities. Disc levels: T12- L1: Unremarkable. L1-L2: Mild disc narrowing.  Anterior annular fissure. L2-L3: Mild disc bulging.  No impingement L3-L4: Disc narrowing and bulging with moderate bilateral foraminal narrowing. Patent spinal canal L4-L5: Advanced facet arthropathy with spurring and anterolisthesis. The disc is narrowed and bulging with posterior annular fissure. Moderate to advanced spinal stenosis. L5 compression in the bilateral subarticular recesses. Bilateral L4 foraminal impingement L5-S1:Posterior annular fissures and mild disc bulging. Negative facets. IMPRESSION: 1. 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 No definite myeloma. 2. Degenerative disease causes canal and biforaminal impingement at L4-5. Electronically Signed   By: JMonte FantasiaM.D.   On: 09/01/2018 13:38   Ir Radiologist Eval & Mgmt  Result Date: 09/01/2018 Please refer to notes tab for details about interventional procedure. (Op Note)   ASSESSMENT & PLAN:   57y.o. male with  1. Multiple Myeloma multiple  myeloma based on  discovery of monoclonal gammopathy in the peripheral blood associated with a hypermetabolic lytic lesion in L4 and presence of 24% of plasma cell population in the bone marrow.  Based on the diagnosis, patient was started on treatment with bortezomib & low-dose dexamethasone.  Initially, lenalidomide was omitted out of concern for high risk of for infectious complications.  We have continued to therapy with just a doublet treatment and did not reintensify the therapy.  Treatment course has been complicated by exacerbation of pulmonary hypertension and now with shingles reactivation BM 03/18/18 shows limited response with still 18% plasma cell in the bone marrow. Cytogenetics shows  14.5% [id number]  09/01/18 MRI Lumbar revealed L4 body abnormality is more consistent with Schmorl's node than myeloma. No definite myeloma. 2. Degenerative disease causes canal and biforaminal impingement at L4-5.   2. Recent pneumonia - now resolved. PLAN:  -Grade I neuropathy, will continue to watch this and pt will let me know if this worsens -not needing pain medications for his back pain now. -Pt has not had h/o blood clots -continue Acyclovir prophylaxis -Recommend that the pt begin taking Vitamin B complex  -Discussed pt labwork today, 09/23/18; HGB increased to 11.2, WBC normalized. PLT increased to 501k. Blood counts and chemistries are otherwise stable.  -Discussed the 09/03/18 CT Angiography Chest which revealed Negative for acute PE or thoracic aortic dissection, with limitations as above. 2. Chronic massive dilatation of central pulmonary arteries consistent with pulmonary arterial hypertension. 3. Bronchiectasis with interval increase in central airway secretions and airspace infiltrates in the right lower lobe. 4. Chronic loculated left pleural gas and fluid.  -Recommend that the pt follow up with PCP for Prevnar and Pneumovax every 5 years -The pt has no prohibitive toxicities from resuming Velcade,  Dexamethasone, and Revlimid (two weeks on and one week off) at this time.   -Continue 47m aspirin -Will refill Zofran, Dexamethasone, and Revlimid today    -continue weekly Velcade with labs - please schedule next 8 treatments RTC with Dr KIrene Limboin 4 weeks    All of the patients questions were answered with apparent satisfaction. The patient knows to call the clinic with any problems, questions or concerns.  The total time spent in the appt was 25 minutes and more than 50% was on counseling and direct patient cares.    GSullivan LoneMD MS AAHIVMS SCovington - Amg Rehabilitation HospitalCMalcom Randall Va Medical CenterHematology/Oncology Physician CJoliet Surgery Center Limited Partnership (Office):       [phone](Work cell):  [phone](Fax):           [phone] 09/23/2018 11:42 AM  I, SBaldwin Jamaica am acting as a scribe for Dr. GSullivan Lone   .I have reviewed the above documentation for accuracy and completeness, and I agree with the above. .Brunetta GeneraMD

## 2018-09-23 ENCOUNTER — Inpatient Hospital Stay: Payer: Medicare Other

## 2018-09-23 ENCOUNTER — Encounter: Payer: Self-pay | Admitting: Hematology

## 2018-09-23 ENCOUNTER — Inpatient Hospital Stay: Payer: Medicare Other | Attending: Hematology | Admitting: Hematology

## 2018-09-23 VITALS — BP 109/84 | HR 99 | Temp 98.3°F | Resp 18 | Ht 60.0 in | Wt 83.0 lb

## 2018-09-23 DIAGNOSIS — C9000 Multiple myeloma not having achieved remission: Secondary | ICD-10-CM

## 2018-09-23 DIAGNOSIS — Z5112 Encounter for antineoplastic immunotherapy: Secondary | ICD-10-CM | POA: Insufficient documentation

## 2018-09-23 DIAGNOSIS — Z7982 Long term (current) use of aspirin: Secondary | ICD-10-CM

## 2018-09-23 LAB — CMP (CANCER CENTER ONLY)
ALBUMIN: 2.8 g/dL — AB (ref 3.5–5.0)
ALT: 11 U/L (ref 0–44)
ANION GAP: 8 (ref 5–15)
AST: 19 U/L (ref 15–41)
Alkaline Phosphatase: 73 U/L (ref 38–126)
BUN: 8 mg/dL (ref 6–20)
CALCIUM: 8.8 mg/dL — AB (ref 8.9–10.3)
CO2: 30 mmol/L (ref 22–32)
Chloride: 104 mmol/L (ref 98–111)
Creatinine: 0.84 mg/dL (ref 0.61–1.24)
GFR, Est AFR Am: >60 mL/min (ref >60)
GFR, Estimated: >60 mL/min (ref >60)
GLUCOSE: 69 mg/dL — AB (ref 70–99)
POTASSIUM: 3.6 mmol/L (ref 3.5–5.1)
SODIUM: 142 mmol/L (ref 135–145)
TOTAL PROTEIN: 7.3 g/dL (ref 6.5–8.1)

## 2018-09-23 LAB — CBC WITH DIFFERENTIAL/PLATELET
Abs Immature Granulocytes: 0.04 10*3/uL (ref 0.00–0.07)
Basophils Absolute: 0.1 10*3/uL (ref 0.0–0.1)
Basophils Relative: 1 %
Eosinophils Absolute: 0.1 10*3/uL (ref 0.0–0.5)
Eosinophils Relative: 1 %
HEMATOCRIT: 35.7 % — AB (ref 39.0–52.0)
HEMOGLOBIN: 11.2 g/dL — AB (ref 13.0–17.0)
Immature Granulocytes: 0 %
Lymphocytes Relative: 17 %
Lymphs Abs: 1.8 10*3/uL (ref 0.7–4.0)
MCH: 24 pg — AB (ref 26.0–34.0)
MCHC: 31.4 g/dL (ref 30.0–36.0)
MCV: 76.4 fL — AB (ref 80.0–100.0)
MONO ABS: 1 10*3/uL (ref 0.1–1.0)
MONOS PCT: 10 %
NEUTROS PCT: 71 %
NRBC: 0 % (ref 0.0–0.2)
Neutro Abs: 7.5 10*3/uL (ref 1.7–7.7)
Platelets: 501 10*3/uL — ABNORMAL HIGH (ref 150–400)
RBC: 4.67 MIL/uL (ref 4.22–5.81)
RDW: 17.5 % — ABNORMAL HIGH (ref 11.5–15.5)
WBC: 10.5 10*3/uL (ref 4.0–10.5)

## 2018-09-23 MED ORDER — ASPIRIN EC 81 MG PO TBEC: 81.0000 mg | DELAYED_RELEASE_TABLET | Freq: Every day | ORAL | 11 refills | Status: DC

## 2018-09-23 MED ORDER — LENALIDOMIDE 15 MG PO CAPS: 15.0000 mg | ORAL_CAPSULE | Freq: Every day | ORAL | 2 refills | Status: DC

## 2018-09-23 MED ORDER — ONDANSETRON HCL 8 MG PO TABS: 8.0000 mg | ORAL_TABLET | Freq: Three times a day (TID) | ORAL | 3 refills | Status: DC | PRN

## 2018-09-23 MED ORDER — DEXAMETHASONE 4 MG PO TABS: 20.0000 mg | ORAL_TABLET | ORAL | 3 refills | Status: DC

## 2018-09-23 MED FILL — ASPIRIN ADULT LOW STRENGTH: 81 | 60 days supply | Qty: 60 | Fill #0

## 2018-09-23 MED FILL — ONDANSETRON HCL 8 MG TABLET: 8 | 10 days supply | Qty: 30 | Fill #0

## 2018-09-23 NOTE — Patient Instructions (Signed)
Thank you for choosing [facility] to provide your oncology and hematology care.  To afford each patient quality time with our providers, please arrive 30 minutes before your scheduled appointment time.  If you arrive late for your appointment, you may be asked to reschedule.  We strive to give you quality time with our providers, and arriving late affects you and other patients whose appointments are after yours.    If you are a no show for multiple scheduled visits, you may be dismissed from the clinic at the providers discretion.     Again, thank you for choosing [facility], our hope is that these requests will decrease the amount of time that you wait before being seen by our physicians.  ______________________________________________________________________   Should you have questions after your visit to the [facility], please contact our office at [phone] between the hours of 8:30 and 4:30 p.m.    Voicemails left after 4:30p.m will not be returned until the following business day.     For prescription refill requests, please have your pharmacy contact us directly.  Please also try to allow 48 hours for prescription requests.     Please contact the scheduling department for questions regarding scheduling.  For scheduling of procedures such as PET scans, CT scans, MRI, Ultrasound, etc please contact central scheduling at [phone].     Resources For Cancer Patients and Caregivers:    Oncolink.org:  A wonderful resource for patients and healthcare providers for information regarding your disease, ways to tract your treatment, what to expect, etc.      American Cancer Society:  800-227-2345  Can help patients locate various types of support and financial assistance   Cancer Care: 1-800-813-HOPE (4673) Provides financial assistance, online support groups, medication/co-pay assistance.     [place] DSS:  336-641-3447 Where to apply  for food stamps, Medicaid, and utility assistance   Medicare Rights Center: 800-333-4114 Helps people with Medicare understand their rights and benefits, navigate the Medicare system, and secure the quality healthcare they deserve   SCAT: 336-333-6589 [place] [employer]'s shared-ride transportation service for eligible riders who have a disability that prevents them from riding the fixed route bus.     For additional information on assistance programs please contact our social worker:   Abigail Elmore:  [phone]  

## 2018-09-24 ENCOUNTER — Inpatient Hospital Stay: Payer: Medicare Other | Admitting: Nutrition

## 2018-09-24 ENCOUNTER — Other Ambulatory Visit: Payer: Self-pay

## 2018-09-24 ENCOUNTER — Inpatient Hospital Stay: Payer: Medicare Other

## 2018-09-24 DIAGNOSIS — Z7189 Other specified counseling: Secondary | ICD-10-CM

## 2018-09-24 DIAGNOSIS — C9000 Multiple myeloma not having achieved remission: Secondary | ICD-10-CM

## 2018-09-24 DIAGNOSIS — Z5112 Encounter for antineoplastic immunotherapy: Secondary | ICD-10-CM | POA: Diagnosis not present

## 2018-09-24 MED ORDER — SODIUM CHLORIDE 0.9 % IV SOLN
Freq: Once | INTRAVENOUS | Status: AC
  Administered 2018-09-24: 15:00:00 via INTRAVENOUS
  Filled 2018-09-24: qty 250

## 2018-09-24 MED ORDER — ZOLEDRONIC ACID 4 MG/5ML IV CONC
3.5000 mg | Freq: Once | INTRAVENOUS | Status: AC
  Administered 2018-09-24: 3.5 mg via INTRAVENOUS
  Filled 2018-09-24: qty 4.38

## 2018-09-24 MED ORDER — PROCHLORPERAZINE MALEATE 10 MG PO TABS
10.0000 mg | ORAL_TABLET | Freq: Once | ORAL | Status: AC
  Administered 2018-09-24: 10 mg via ORAL

## 2018-09-24 MED ORDER — BORTEZOMIB CHEMO SQ INJECTION 3.5 MG (2.5MG/ML)
1.3000 mg/m2 | Freq: Once | INTRAMUSCULAR | Status: AC
  Administered 2018-09-24: 1.75 mg via SUBCUTANEOUS
  Filled 2018-09-24: qty 0.7

## 2018-09-24 MED ORDER — PROCHLORPERAZINE MALEATE 10 MG PO TABS
ORAL_TABLET | ORAL | Status: AC
  Filled 2018-09-24: qty 1

## 2018-09-24 NOTE — Progress Notes (Signed)
57 year old male diagnosed with multiple myeloma receiving Velcade.  Past medical history includes pulmonary hypertension and acute on chronic renal failure.  Medications include Decadron, Colace, Prilosec, and Zofran.  Labs include albumin 2.8 and glucose 69 on November 20.  Height: 5 feet 0 inches Weight: 83 pounds November 20. Usual body weight: 100 pounds per patient. BMI: 16.21.  Patient was diagnosed with severe malnutrition in the context of chronic illness during his recent hospitalization. His weight has improved approximately 5 pounds over the past month. Patient reports that his taste has improved and he is eating better. Patient denies other nutrition impact symptoms.  Nutrition diagnosis:  Unintended weight loss related to taste alterations as evidenced by approximate 20 pound weight loss from usual body weight.  Intervention: Patient was educated to continue small more frequent meals and snacks with adequate calories and protein. Encourage patient to drink Ensure or boost 2-3 times daily.  Provided coupons. Teach back method used.  Monitoring, evaluation, goals: Patient will tolerate increased calories and protein for minimal weight loss.  Next visit: To be scheduled as needed.  **Disclaimer: This note was dictated with voice recognition software. Similar sounding words can inadvertently be transcribed and this note may contain transcription errors which may not have been corrected upon publication of note.**

## 2018-09-24 NOTE — Patient Instructions (Signed)
[facility] Discharge Instructions for Patients Receiving Chemotherapy  Today you received the following chemotherapy agents:  Velcade  To help prevent nausea and vomiting after your treatment, we encourage you to take your nausea medication:  Velcade   If you develop nausea and vomiting that is not controlled by your nausea medication, call the clinic.   BELOW ARE SYMPTOMS THAT SHOULD BE REPORTED IMMEDIATELY:  *FEVER GREATER THAN 100.5 F  *CHILLS WITH OR WITHOUT FEVER  NAUSEA AND VOMITING THAT IS NOT CONTROLLED WITH YOUR NAUSEA MEDICATION  *UNUSUAL SHORTNESS OF BREATH  *UNUSUAL BRUISING OR BLEEDING  TENDERNESS IN MOUTH AND THROAT WITH OR WITHOUT PRESENCE OF ULCERS  *URINARY PROBLEMS  *BOWEL PROBLEMS  UNUSUAL RASH Items with * indicate a potential emergency and should be followed up as soon as possible.  Feel free to call the clinic should you have any questions or concerns. The clinic phone number is [phone].  Please show the [facility] at check-in to the Emergency Department and triage nurse.  Zoledronic Acid injection (Hypercalcemia, Oncology) What is this medicine? ZOLEDRONIC ACID (ZOE le dron ik AS id) lowers the amount of calcium loss from bone. It is used to treat too much calcium in your blood from cancer. It is also used to prevent complications of cancer that has spread to the bone. This medicine may be used for other purposes; ask your health care provider or pharmacist if you have questions. COMMON BRAND NAME(S): Zometa What should I tell my health care provider before I take this medicine? They need to know if you have any of these conditions: -aspirin-sensitive asthma -cancer, especially if you are receiving medicines used to treat cancer -dental disease or wear dentures -infection -kidney disease -receiving corticosteroids like dexamethasone or prednisone -an unusual or allergic reaction to zoledronic acid, other medicines,  foods, dyes, or preservatives -pregnant or trying to get pregnant -breast-feeding How should I use this medicine? This medicine is for infusion into a vein. It is given by a health care professional in a hospital or clinic setting. Talk to your pediatrician regarding the use of this medicine in children. Special care may be needed. Overdosage: If you think you have taken too much of this medicine contact a poison control center or emergency room at once. NOTE: This medicine is only for you. Do not share this medicine with others. What if I miss a dose? It is important not to miss your dose. Call your doctor or health care professional if you are unable to keep an appointment. What may interact with this medicine? -certain antibiotics given by injection -NSAIDs, medicines for pain and inflammation, like ibuprofen or naproxen -some diuretics like bumetanide, furosemide -teriparatide -thalidomide This list may not describe all possible interactions. Give your health care provider a list of all the medicines, herbs, non-prescription drugs, or dietary supplements you use. Also tell them if you smoke, drink alcohol, or use illegal drugs. Some items may interact with your medicine. What should I watch for while using this medicine? Visit your doctor or health care professional for regular checkups. It may be some time before you see the benefit from this medicine. Do not stop taking your medicine unless your doctor tells you to. Your doctor may order blood tests or other tests to see how you are doing. Women should inform their doctor if they wish to become pregnant or think they might be pregnant. There is a potential for serious side effects to an unborn child. Talk  to your health care professional or pharmacist for more information. You should make sure that you get enough calcium and vitamin D while you are taking this medicine. Discuss the foods you eat and the vitamins you take with your health  care professional. Some people who take this medicine have severe bone, joint, and/or muscle pain. This medicine may also increase your risk for jaw problems or a broken thigh bone. Tell your doctor right away if you have severe pain in your jaw, bones, joints, or muscles. Tell your doctor if you have any pain that does not go away or that gets worse. Tell your dentist and dental surgeon that you are taking this medicine. You should not have major dental surgery while on this medicine. See your dentist to have a dental exam and fix any dental problems before starting this medicine. Take good care of your teeth while on this medicine. Make sure you see your dentist for regular follow-up appointments. What side effects may I notice from receiving this medicine? Side effects that you should report to your doctor or health care professional as soon as possible: -allergic reactions like skin rash, itching or hives, swelling of the face, lips, or tongue -anxiety, confusion, or depression -breathing problems -changes in vision -eye pain -feeling faint or lightheaded, falls -jaw pain, especially after dental work -mouth sores -muscle cramps, stiffness, or weakness -redness, blistering, peeling or loosening of the skin, including inside the mouth -trouble passing urine or change in the amount of urine Side effects that usually do not require medical attention (report to your doctor or health care professional if they continue or are bothersome): -bone, joint, or muscle pain -constipation -diarrhea -fever -hair loss -irritation at site where injected -loss of appetite -nausea, vomiting -stomach upset -trouble sleeping -trouble swallowing -weak or tired This list may not describe all possible side effects. Call your doctor for medical advice about side effects. You may report side effects to FDA at 1-800-FDA-1088. Where should I keep my medicine? This drug is given in a hospital or clinic and  will not be stored at home. NOTE: This sheet is a summary. It may not cover all possible information. If you have questions about this medicine, talk to your doctor, pharmacist, or health care provider.  2018 Elsevier/Gold Standard (2014-03-19 14:19:39)

## 2018-09-30 ENCOUNTER — Ambulatory Visit: Payer: Self-pay

## 2018-09-30 ENCOUNTER — Telehealth: Payer: Self-pay

## 2018-09-30 ENCOUNTER — Other Ambulatory Visit: Payer: Self-pay

## 2018-09-30 NOTE — Telephone Encounter (Signed)
Oral Oncology Patient Advocate Encounter  [employer] manufacturer assistance will expire 11/03/18. I called the patient to let him know I needed 2018 tax return and his signature. He came into the office and signed the application but stated he will bring tax return this week.   I called him today to follow up with him and the voicemail was full.  This encounter will be updated until final determination from [facility] [place] Phone [phone] Fax [phone]

## 2018-10-02 ENCOUNTER — Inpatient Hospital Stay: Payer: Medicare Other

## 2018-10-02 ENCOUNTER — Encounter: Payer: Self-pay | Admitting: General Practice

## 2018-10-02 ENCOUNTER — Inpatient Hospital Stay: Payer: Medicare Other | Admitting: Hematology

## 2018-10-02 ENCOUNTER — Other Ambulatory Visit: Payer: Self-pay | Admitting: Hematology

## 2018-10-02 DIAGNOSIS — Z5112 Encounter for antineoplastic immunotherapy: Secondary | ICD-10-CM | POA: Diagnosis not present

## 2018-10-02 DIAGNOSIS — Z7189 Other specified counseling: Secondary | ICD-10-CM

## 2018-10-02 DIAGNOSIS — C9000 Multiple myeloma not having achieved remission: Secondary | ICD-10-CM

## 2018-10-02 LAB — CMP (CANCER CENTER ONLY)
ALBUMIN: 3.2 g/dL — AB (ref 3.5–5.0)
ALT: 12 U/L (ref 0–44)
ANION GAP: 10 (ref 5–15)
AST: 18 U/L (ref 15–41)
Alkaline Phosphatase: 81 U/L (ref 38–126)
BILIRUBIN TOTAL: 0.4 mg/dL (ref 0.3–1.2)
BUN: 15 mg/dL (ref 6–20)
CO2: 31 mmol/L (ref 22–32)
Calcium: 9.3 mg/dL (ref 8.9–10.3)
Chloride: 100 mmol/L (ref 98–111)
Creatinine: 0.79 mg/dL (ref 0.61–1.24)
GFR, Est AFR Am: >60 mL/min (ref >60)
GLUCOSE: 101 mg/dL — AB (ref 70–99)
POTASSIUM: 3.8 mmol/L (ref 3.5–5.1)
Sodium: 141 mmol/L (ref 135–145)
TOTAL PROTEIN: 7.6 g/dL (ref 6.5–8.1)

## 2018-10-02 LAB — CBC WITH DIFFERENTIAL/PLATELET
ABS IMMATURE GRANULOCYTES: 0.03 10*3/uL (ref 0.00–0.07)
BASOS ABS: 0 10*3/uL (ref 0.0–0.1)
BASOS PCT: 0 %
Eosinophils Absolute: 0.5 10*3/uL (ref 0.0–0.5)
Eosinophils Relative: 9 %
HCT: 35.1 % — ABNORMAL LOW (ref 39.0–52.0)
Hemoglobin: 11.1 g/dL — ABNORMAL LOW (ref 13.0–17.0)
IMMATURE GRANULOCYTES: 1 %
Lymphocytes Relative: 21 %
Lymphs Abs: 1.3 10*3/uL (ref 0.7–4.0)
MCH: 24.2 pg — ABNORMAL LOW (ref 26.0–34.0)
MCHC: 31.6 g/dL (ref 30.0–36.0)
MCV: 76.5 fL — ABNORMAL LOW (ref 80.0–100.0)
Monocytes Absolute: 0.6 10*3/uL (ref 0.1–1.0)
Monocytes Relative: 9 %
NEUTROS ABS: 3.7 10*3/uL (ref 1.7–7.7)
NEUTROS PCT: 60 %
PLATELETS: 389 10*3/uL (ref 150–400)
RBC: 4.59 MIL/uL (ref 4.22–5.81)
RDW: 17.3 % — ABNORMAL HIGH (ref 11.5–15.5)
WBC: 6.2 10*3/uL (ref 4.0–10.5)
nRBC: 0 % (ref 0.0–0.2)

## 2018-10-02 MED ORDER — DEXAMETHASONE 4 MG PO TABS: 20.0000 mg | ORAL_TABLET | ORAL | 3 refills | Status: DC

## 2018-10-02 MED ORDER — PROCHLORPERAZINE MALEATE 10 MG PO TABS
ORAL_TABLET | ORAL | Status: AC
  Filled 2018-10-02: qty 1

## 2018-10-02 MED ORDER — PROCHLORPERAZINE MALEATE 10 MG PO TABS
10.0000 mg | ORAL_TABLET | Freq: Once | ORAL | Status: AC
  Administered 2018-10-02: 10 mg via ORAL

## 2018-10-02 MED ORDER — ZOLEDRONIC ACID 4 MG/5ML IV CONC: 4.0000 mg | Freq: Once | INTRAVENOUS | Status: DC

## 2018-10-02 MED ORDER — SODIUM CHLORIDE 0.9 % IV SOLN
INTRAVENOUS | Status: DC
  Administered 2018-10-02: 10:00:00 via INTRAVENOUS
  Filled 2018-10-02: qty 250

## 2018-10-02 MED ORDER — BORTEZOMIB CHEMO SQ INJECTION 3.5 MG (2.5MG/ML)
1.3000 mg/m2 | Freq: Once | INTRAMUSCULAR | Status: AC
  Administered 2018-10-02: 1.75 mg via SUBCUTANEOUS
  Filled 2018-10-02: qty 0.7

## 2018-10-02 MED FILL — DEXAMETHASONE 4 MG TABLET: 4 | 28 days supply | Qty: 20 | Fill #0

## 2018-10-02 NOTE — Patient Instructions (Addendum)
[facility] Discharge Instructions for Patients Receiving Chemotherapy  Today you received the following chemotherapy agents Velcade  To help prevent nausea and vomiting after your treatment, we encourage you to take your nausea medication  As directed   If you develop nausea and vomiting that is not controlled by your nausea medication, call the clinic.   BELOW ARE SYMPTOMS THAT SHOULD BE REPORTED IMMEDIATELY:  *FEVER GREATER THAN 100.5 F  *CHILLS WITH OR WITHOUT FEVER  NAUSEA AND VOMITING THAT IS NOT CONTROLLED WITH YOUR NAUSEA MEDICATION  *UNUSUAL SHORTNESS OF BREATH  *UNUSUAL BRUISING OR BLEEDING  TENDERNESS IN MOUTH AND THROAT WITH OR WITHOUT PRESENCE OF ULCERS  *URINARY PROBLEMS  *BOWEL PROBLEMS  UNUSUAL RASH Items with * indicate a potential emergency and should be followed up as soon as possible.  Feel free to call the clinic should you have any questions or concerns. The clinic phone number is [phone].  Please show the [facility] at check-in to the Emergency Department and triage nurse.

## 2018-10-02 NOTE — Progress Notes (Signed)
[facility] CSW Progress Notes  Met w patient and [employer] interpreter Marcial Pacas in infusion room.  Discussed upcoming appt w [facility] for disability application.  Patient states that he believes he will see [facility] on 12/13 but could not name the time.  Email sent to [facility] to clarify time/date of appt w patient, as there may be some confusion about this appointment.  [facility] closed today, so they will need to connect w patient next week.   Edwyna Shell, LCSW Clinical Social Worker Phone:  [phone]

## 2018-10-05 ENCOUNTER — Telehealth: Payer: Self-pay

## 2018-10-05 NOTE — Telephone Encounter (Signed)
Per 11/29 no los

## 2018-10-06 LAB — MULTIPLE MYELOMA PANEL, SERUM
ALBUMIN SERPL ELPH-MCNC: 3 g/dL (ref 2.9–4.4)
Albumin/Glob SerPl: 0.9 (ref 0.7–1.7)
Alpha 1: 0.3 g/dL (ref 0.0–0.4)
Alpha2 Glob SerPl Elph-Mcnc: 0.9 g/dL (ref 0.4–1.0)
B-Globulin SerPl Elph-Mcnc: 1 g/dL (ref 0.7–1.3)
GAMMA GLOB SERPL ELPH-MCNC: 1.4 g/dL (ref 0.4–1.8)
GLOBULIN, TOTAL: 3.6 g/dL (ref 2.2–3.9)
IgA: 191 mg/dL (ref 90–386)
IgG (Immunoglobin G), Serum: 1608 mg/dL — ABNORMAL HIGH (ref 700–1600)
IgM (Immunoglobulin M), Srm: 57 mg/dL (ref 20–172)
M Protein SerPl Elph-Mcnc: 0.6 g/dL — ABNORMAL HIGH
Total Protein ELP: 6.6 g/dL (ref 6.0–8.5)

## 2018-10-07 ENCOUNTER — Other Ambulatory Visit: Payer: Self-pay

## 2018-10-07 ENCOUNTER — Ambulatory Visit: Payer: Self-pay

## 2018-10-08 ENCOUNTER — Inpatient Hospital Stay: Payer: Medicare Other

## 2018-10-08 ENCOUNTER — Inpatient Hospital Stay: Payer: Medicare Other | Attending: Hematology

## 2018-10-08 VITALS — BP 99/61 | HR 87 | Temp 97.9°F | Resp 17 | Ht 61.0 in | Wt 85.5 lb

## 2018-10-08 DIAGNOSIS — C9000 Multiple myeloma not having achieved remission: Secondary | ICD-10-CM | POA: Diagnosis present

## 2018-10-08 DIAGNOSIS — Z5112 Encounter for antineoplastic immunotherapy: Secondary | ICD-10-CM | POA: Insufficient documentation

## 2018-10-08 LAB — CMP (CANCER CENTER ONLY)
ALK PHOS: 72 U/L (ref 38–126)
ALT: 15 U/L (ref 0–44)
ANION GAP: 10 (ref 5–15)
AST: 23 U/L (ref 15–41)
Albumin: 3.1 g/dL — ABNORMAL LOW (ref 3.5–5.0)
BUN: 12 mg/dL (ref 6–20)
CO2: 28 mmol/L (ref 22–32)
Calcium: 9.1 mg/dL (ref 8.9–10.3)
Chloride: 100 mmol/L (ref 98–111)
Creatinine: 0.9 mg/dL (ref 0.61–1.24)
GFR, Est AFR Am: >60 mL/min (ref >60)
GFR, Estimated: >60 mL/min (ref >60)
Glucose, Bld: 61 mg/dL — ABNORMAL LOW (ref 70–99)
Potassium: 3.3 mmol/L — ABNORMAL LOW (ref 3.5–5.1)
Sodium: 138 mmol/L (ref 135–145)
Total Bilirubin: 0.3 mg/dL (ref 0.3–1.2)
Total Protein: 7 g/dL (ref 6.5–8.1)

## 2018-10-08 LAB — CBC WITH DIFFERENTIAL/PLATELET
Abs Immature Granulocytes: 0.03 10*3/uL (ref 0.00–0.07)
Basophils Absolute: 0 10*3/uL (ref 0.0–0.1)
Basophils Relative: 0 %
Eosinophils Absolute: 0.4 10*3/uL (ref 0.0–0.5)
Eosinophils Relative: 5 %
HCT: 33.3 % — ABNORMAL LOW (ref 39.0–52.0)
HEMOGLOBIN: 10.4 g/dL — AB (ref 13.0–17.0)
Immature Granulocytes: 0 %
Lymphocytes Relative: 20 %
Lymphs Abs: 1.7 10*3/uL (ref 0.7–4.0)
MCH: 23.9 pg — ABNORMAL LOW (ref 26.0–34.0)
MCHC: 31.2 g/dL (ref 30.0–36.0)
MCV: 76.6 fL — ABNORMAL LOW (ref 80.0–100.0)
Monocytes Absolute: 0.8 10*3/uL (ref 0.1–1.0)
Monocytes Relative: 9 %
Neutro Abs: 5.6 10*3/uL (ref 1.7–7.7)
Neutrophils Relative %: 66 %
Platelets: 354 10*3/uL (ref 150–400)
RBC: 4.35 MIL/uL (ref 4.22–5.81)
RDW: 18 % — ABNORMAL HIGH (ref 11.5–15.5)
WBC: 8.6 10*3/uL (ref 4.0–10.5)
nRBC: 0 % (ref 0.0–0.2)

## 2018-10-08 MED ORDER — PROCHLORPERAZINE MALEATE 10 MG PO TABS
10.0000 mg | ORAL_TABLET | Freq: Once | ORAL | Status: AC
  Administered 2018-10-08: 10 mg via ORAL

## 2018-10-08 MED ORDER — BORTEZOMIB CHEMO SQ INJECTION 3.5 MG (2.5MG/ML)
1.3000 mg/m2 | Freq: Once | INTRAMUSCULAR | Status: AC
  Administered 2018-10-08: 1.75 mg via SUBCUTANEOUS
  Filled 2018-10-08: qty 0.7

## 2018-10-08 MED ORDER — PROCHLORPERAZINE MALEATE 10 MG PO TABS
ORAL_TABLET | ORAL | Status: AC
  Filled 2018-10-08: qty 1

## 2018-10-08 NOTE — Patient Instructions (Signed)
[facility] Discharge Instructions for Patients Receiving Chemotherapy  Today you received the following chemotherapy agents Bortezomib (VELCADE).  To help prevent nausea and vomiting after your treatment, we encourage you to take your nausea medication as prescribed.   If you develop nausea and vomiting that is not controlled by your nausea medication, call the clinic.   BELOW ARE SYMPTOMS THAT SHOULD BE REPORTED IMMEDIATELY:  *FEVER GREATER THAN 100.5 F  *CHILLS WITH OR WITHOUT FEVER  NAUSEA AND VOMITING THAT IS NOT CONTROLLED WITH YOUR NAUSEA MEDICATION  *UNUSUAL SHORTNESS OF BREATH  *UNUSUAL BRUISING OR BLEEDING  TENDERNESS IN MOUTH AND THROAT WITH OR WITHOUT PRESENCE OF ULCERS  *URINARY PROBLEMS  *BOWEL PROBLEMS  UNUSUAL RASH Items with * indicate a potential emergency and should be followed up as soon as possible.  Feel free to call the clinic should you have any questions or concerns. The clinic phone number is [phone].  Please show the CHEMO ALERT CARD at check-in to the Emergency Department and triage nurse.   

## 2018-10-09 NOTE — Progress Notes (Signed)
This encounter was created in error - please disregard.

## 2018-10-14 ENCOUNTER — Other Ambulatory Visit: Payer: Self-pay | Admitting: Hematology

## 2018-10-14 ENCOUNTER — Inpatient Hospital Stay: Payer: Medicare Other

## 2018-10-14 VITALS — BP 106/65 | HR 71 | Temp 98.0°F | Resp 18

## 2018-10-14 DIAGNOSIS — Z5112 Encounter for antineoplastic immunotherapy: Secondary | ICD-10-CM | POA: Diagnosis not present

## 2018-10-14 DIAGNOSIS — C9000 Multiple myeloma not having achieved remission: Secondary | ICD-10-CM

## 2018-10-14 LAB — CBC WITH DIFFERENTIAL/PLATELET
Abs Immature Granulocytes: 0.02 10*3/uL (ref 0.00–0.07)
Basophils Absolute: 0 10*3/uL (ref 0.0–0.1)
Basophils Relative: 0 %
Eosinophils Absolute: 0.2 10*3/uL (ref 0.0–0.5)
Eosinophils Relative: 3 %
HCT: 31.9 % — ABNORMAL LOW (ref 39.0–52.0)
Hemoglobin: 10.1 g/dL — ABNORMAL LOW (ref 13.0–17.0)
Immature Granulocytes: 0 %
Lymphocytes Relative: 15 %
Lymphs Abs: 0.9 10*3/uL (ref 0.7–4.0)
MCH: 24.4 pg — ABNORMAL LOW (ref 26.0–34.0)
MCHC: 31.7 g/dL (ref 30.0–36.0)
MCV: 77.1 fL — ABNORMAL LOW (ref 80.0–100.0)
Monocytes Absolute: 1 10*3/uL (ref 0.1–1.0)
Monocytes Relative: 17 %
Neutro Abs: 4.1 10*3/uL (ref 1.7–7.7)
Neutrophils Relative %: 65 %
Platelets: 239 10*3/uL (ref 150–400)
RBC: 4.14 MIL/uL — ABNORMAL LOW (ref 4.22–5.81)
RDW: 18.6 % — ABNORMAL HIGH (ref 11.5–15.5)
WBC: 6.2 10*3/uL (ref 4.0–10.5)
nRBC: 0 % (ref 0.0–0.2)

## 2018-10-14 LAB — COMPREHENSIVE METABOLIC PANEL
ALBUMIN: 3.1 g/dL — AB (ref 3.5–5.0)
ALT: 13 U/L (ref 0–44)
AST: 21 U/L (ref 15–41)
Alkaline Phosphatase: 70 U/L (ref 38–126)
Anion gap: 8 (ref 5–15)
BUN: 10 mg/dL (ref 6–20)
CALCIUM: 8.7 mg/dL — AB (ref 8.9–10.3)
CO2: 32 mmol/L (ref 22–32)
Chloride: 99 mmol/L (ref 98–111)
Creatinine, Ser: 1.03 mg/dL (ref 0.61–1.24)
GFR calc Af Amer: >60 mL/min (ref >60)
GFR calc non Af Amer: >60 mL/min (ref >60)
Glucose, Bld: 90 mg/dL (ref 70–99)
Potassium: 3.4 mmol/L — ABNORMAL LOW (ref 3.5–5.1)
Sodium: 139 mmol/L (ref 135–145)
Total Bilirubin: 0.3 mg/dL (ref 0.3–1.2)
Total Protein: 6.5 g/dL (ref 6.5–8.1)

## 2018-10-14 MED ORDER — PROCHLORPERAZINE MALEATE 10 MG PO TABS
10.0000 mg | ORAL_TABLET | Freq: Once | ORAL | Status: AC
  Administered 2018-10-14: 10 mg via ORAL

## 2018-10-14 MED ORDER — BORTEZOMIB CHEMO SQ INJECTION 3.5 MG (2.5MG/ML)
1.3000 mg/m2 | Freq: Once | INTRAMUSCULAR | Status: AC
  Administered 2018-10-14: 1.75 mg via SUBCUTANEOUS
  Filled 2018-10-14: qty 0.7

## 2018-10-14 MED ORDER — PROCHLORPERAZINE MALEATE 10 MG PO TABS
ORAL_TABLET | ORAL | Status: AC
  Filled 2018-10-14: qty 1

## 2018-10-14 NOTE — Patient Instructions (Signed)
[facility] Discharge Instructions for Patients Receiving Chemotherapy  Today you received the following chemotherapy agents Velcade  To help prevent nausea and vomiting after your treatment, we encourage you to take your nausea medication  As directed   If you develop nausea and vomiting that is not controlled by your nausea medication, call the clinic.   BELOW ARE SYMPTOMS THAT SHOULD BE REPORTED IMMEDIATELY:  *FEVER GREATER THAN 100.5 F  *CHILLS WITH OR WITHOUT FEVER  NAUSEA AND VOMITING THAT IS NOT CONTROLLED WITH YOUR NAUSEA MEDICATION  *UNUSUAL SHORTNESS OF BREATH  *UNUSUAL BRUISING OR BLEEDING  TENDERNESS IN MOUTH AND THROAT WITH OR WITHOUT PRESENCE OF ULCERS  *URINARY PROBLEMS  *BOWEL PROBLEMS  UNUSUAL RASH Items with * indicate a potential emergency and should be followed up as soon as possible.  Feel free to call the clinic should you have any questions or concerns. The clinic phone number is [phone].  Please show the [facility] at check-in to the Emergency Department and triage nurse.

## 2018-10-16 ENCOUNTER — Encounter: Payer: Self-pay | Admitting: General Practice

## 2018-10-16 NOTE — Progress Notes (Signed)
[facility] CSW Progress Notes  Patient no showed for scheduled appointment w [facility] to complete disability application.  Spoke w patient by phone, says he will be here on Weds 12/18 for infusion.  [facility] will return at that time, also advised that they need to schedule [employer] interpreter for this interview.  Edwyna Shell, LCSW Clinical Social Worker Phone:  [phone]

## 2018-10-19 ENCOUNTER — Other Ambulatory Visit: Payer: Self-pay | Admitting: *Deleted

## 2018-10-19 MED ORDER — LENALIDOMIDE 15 MG PO CAPS: 15.0000 mg | ORAL_CAPSULE | Freq: Every day | ORAL | 0 refills | Status: DC

## 2018-10-20 ENCOUNTER — Other Ambulatory Visit: Payer: Self-pay | Admitting: Pulmonary Disease

## 2018-10-20 NOTE — Progress Notes (Signed)
HEMATOLOGY/ONCOLOGY CONSULTATION NOTE  Date of Service: 10/21/2018  Patient Care Team: Tawny Asal as PCP - General (Physician Assistant) Michel Bickers, MD as Consulting Physician (Infectious Diseases) Brand Males, MD as Consulting Physician (Pulmonary Disease)  CHIEF COMPLAINTS/PURPOSE OF CONSULTATION:  Multiple myeloma  Oncologic History:  57 y.o.  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  Based on the diagnosis, patient was started on treatment with bortezomib & low-dose dexamethasone.  Initially, lenalidomide was omitted out of concern for high risk of for infectious complications.  We have continued to therapy with just a doublet treatment and did not reintensify the therapy.  Treatment course has been complicated by exacerbation of pulmonary hypertension and now with shingles reactivation resulting in blistering rash and skin ulcerations on the left flank.  Infection appears to be responding to higher dose of acyclovir and skin is healing.   HISTORY OF PRESENTING ILLNESS:   [name] [name] is a wonderful 57 y.o. male who has been referred to [place] by my colleague Dr [name] for evaluation and management of Multiple myeloma. He is accompanied today by a Optometrist. The pt reports that he is doing well overall.   The pt has been followed by my colleague Dr [name] and has been treated with Velcade and dexamethasone.    The pt reports that he has lost some weight and hasn't eaten as much because he hasn't been able to taste his food as well.   He notes that his shingles rash has continued to clear up and he denies pain associated with his remaining rash. His back pain continues and he has run out of his Hydromorphone. He notes that his back  pain has not gotten better since he was first evaluated. He notes that he feels tired and has a little SOB.   Most recent lab results (04/07/18) of CBC and CMP is as follows: all values are WNL except for Sodium at 134, chloride at 96, Total Protein at 8.7, Albumin at 3.0, Total bilirubin <0.2, WBC at 19.1k, HGB at 10.7, HCT at 33.2, MCV at 73.5, MCH at 23.7, RDW at 18.4, Platelets at 637k, ANC at 17.1k.Marland Kitchen MMP 04/07/18 showed IgG elevated at 2301 and M Protein at 1.5. Ferritin 04/07/18 was elevated at 436 Iron/TIBC 04/07/18 showed at 12% saturation ratio LDH 04/07/18 is WNL at 171 Kappa/Lambda 04/07/18 showed Kappa at 25.2 and K:L ratio at 2.27  On review of systems, pt reports some SOB, back pain, resolving shingles rash, mouth sores, weight loss, and denies fevers, chills, new skin rashes, and any other symptoms.  Interval History:   [name] [name] returns today for management and evaluation of his multiple myeloma. The patient's last visit with [place] was on 09/23/18. He is accompanied today by a Optometrist. The pt reports that he is doing well overall.   The pt reports that the tingling in his feet is stable and denies any worsening neuropathy. He denies any fevers and notes that his breathing has been normal and denies any cough.   The pt notes that he is continuing to tolerate Revlimid well and is on day 14 of his current cycle, and will start his week off Revlimid tomorrow. He denies any new back pain or bone pains.   Lab results today (10/21/18) of CBC  w/diff and CMP is as follows: all values are WNL except for HGB at 10.9, HCT at 34.7, MCV at 76.6, MCH at 24.1, RDW at 18.6, Monocytes abs at 1.1k, Calcium at 8.5, Albumin at 3.0.  On review of systems, pt reports stable neuropathy, improved back pain, normal breathing, stable energy levels, eating well, and denies fevers, cough, worsening neuropathy, abdominal pains, and any other symptoms.   MEDICAL HISTORY:  Past Medical History:  Diagnosis Date  . Acute  on chronic respiratory failure ([place]) 02/07/2018  . Hypertension   . Kidney stones   . Pulmonary hypertension ([place])     SURGICAL HISTORY: Past Surgical History:  Procedure Laterality Date  . CARDIAC CATHETERIZATION N/A 01/18/2016   Procedure: Right Heart Cath;  Surgeon: Jolaine Artist, MD;  Location: [facility];  Service: Cardiovascular;  Laterality: N/A;  . CARDIAC CATHETERIZATION N/A 06/24/2016   Procedure: Right Heart Cath;  Surgeon: Jolaine Artist, MD;  Location: [place] [facility];  Service: Cardiovascular;  Laterality: N/A;  . IR RADIOLOGIST EVAL & MGMT  05/27/2018  . IR RADIOLOGIST EVAL & MGMT  09/01/2018  . KIDNEY SURGERY    . VIDEO ASSISTED THORACOSCOPY (VATS)/DECORTICATION Left 2003  . VIDEO ASSISTED THORACOSCOPY (VATS)/DECORTICATION Left 10/02/2015   Procedure: VIDEO ASSISTED THORACOSCOPY (VATS)/DECORTICATION and drainage of chronic empyena;  Surgeon: [name], MD;  Location: [facility];  Service: Thoracic;  Laterality: Left;  Marland Kitchen VIDEO BRONCHOSCOPY N/A 10/02/2015   Procedure: VIDEO BRONCHOSCOPY;  Surgeon: [name], MD;  Location: [facility] OR;  Service: Thoracic;  Laterality: N/A;    SOCIAL HISTORY: Social History   Socioeconomic History  . Marital status: Single    Spouse name: Not on file  . Number of children: Not on file  . Years of education: Not on file  . Highest education level: Not on file  Occupational History  . Not on file  Social Needs  . Financial resource strain: Not on file  . Food insecurity:    Worry: Not on file    Inability: Not on file  . Transportation needs:    Medical: Not on file    Non-medical: Not on file  Tobacco Use  . Smoking status: Never Smoker  . Smokeless tobacco: Never Used  Substance and Sexual Activity  . Alcohol use: Yes  . Drug use: No  . Sexual activity: Not on file  Lifestyle  . Physical activity:    Days per week: Not on file    Minutes per session: Not on file  . Stress: Not on file    Relationships  . Social connections:    Talks on phone: Not on file    Gets together: Not on file    Attends religious service: Not on file    Active member of club or organization: Not on file    Attends meetings of clubs or organizations: Not on file    Relationship status: Not on file  . Intimate partner violence:    Fear of current or ex partner: Not on file    Emotionally abused: Not on file    Physically abused: Not on file    Forced sexual activity: Not on file  Other Topics Concern  . Not on file  Social History Narrative  . Not on file    FAMILY HISTORY: Family History  Problem Relation Age of Onset  . Hypertension Other     ALLERGIES:  has No Known Allergies.  MEDICATIONS:  Current Outpatient Medications  Medication Sig Dispense Refill  . acyclovir (ZOVIRAX) 400 MG tablet Take 1 tablet (400 mg total) by mouth daily. 30 tablet 3  . aspirin EC 81 MG tablet Take 1 tablet (81 mg total) by mouth daily. 60 tablet 11  . dexamethasone (DECADRON) 4 MG tablet Take 5 tablets (20 mg total) by mouth once a week. 20 tablet 3  . docusate sodium (COLACE) 50 MG capsule Take 1 capsule (50 mg total) by mouth daily. 30 capsule 0  . Fluticasone-Umeclidin-Vilant (TRELEGY ELLIPTA) 100-62.5-25 MCG/INH AEPB Inhale 1 puff into the lungs daily. 1 each 5  . guaiFENesin (MUCINEX) 600 MG 12 hr tablet Take 2 tablets (1,200 mg total) by mouth 2 (two) times daily. 20 tablet 0  . lenalidomide (REVLIMID) 15 MG capsule Take 1 capsule (15 mg total) by mouth daily. Take for 21 days and take 7 days off. Authorization#[id number] 10/19/18 21 capsule 0  . nortriptyline (PAMELOR) 25 MG capsule Take 1 capsule (25 mg total) by mouth at bedtime. 30 capsule 3  . omeprazole (PRILOSEC) 40 MG capsule Take 1 capsule (40 mg total) by mouth daily. 30 capsule 2  . ondansetron (ZOFRAN) 8 MG tablet Take 1 tablet (8 mg total) by mouth every 8 (eight) hours as needed for nausea. 30 tablet 3  . oxyCODONE-acetaminophen  (PERCOCET/ROXICET) 5-325 MG tablet Take 1 tablet by mouth every 6 (six) hours as needed for severe pain. 30 tablet 0  . OXYGEN 2lpm BEDTIME ONLY    . PROVENTIL HFA 108 (90 Base) MCG/ACT inhaler INHALE 2 PUFFS EVERY 6 HOURS AS NEEDED FOR WHEEZING OR SHORTNESS OF BREATH. 3 each 0   No current facility-administered medications for this visit.     REVIEW OF SYSTEMS:    A 10+ POINT REVIEW OF SYSTEMS WAS OBTAINED including neurology, dermatology, psychiatry, cardiac, respiratory, lymph, extremities, GI, GU, Musculoskeletal, constitutional, breasts, reproductive, HEENT.  All pertinent positives are noted in the HPI.  All others are negative.   PHYSICAL EXAMINATION: ECOG PERFORMANCE STATUS: 2 - Symptomatic, <50% confined to bed  Vitals:   10/21/18 0845  BP: 105/65  Pulse: 74  Resp: 18  Temp: 97.7 F (36.5 C)  SpO2: 98%   Filed Weights   10/21/18 0845  Weight: 85 lb 12.8 oz (38.9 kg)   .Body mass index is 16.21 kg/m.  GENERAL:alert, in no acute distress and comfortable SKIN: no acute rashes, no significant lesions EYES: conjunctiva are pink and non-injected, sclera anicteric OROPHARYNX: MMM, no exudates, no oropharyngeal erythema or ulceration NECK: supple, no JVD LYMPH:  no palpable lymphadenopathy in the cervical, axillary or inguinal regions LUNGS: clear to auscultation b/l with normal respiratory effort HEART: regular rate & rhythm ABDOMEN:  normoactive bowel sounds , non tender, not distended. No palpable hepatosplenomegaly.  Extremity: no pedal edema PSYCH: alert & oriented x 3 with fluent speech NEURO: no focal motor/sensory deficits   LABORATORY DATA:  I have reviewed the data as listed  . CBC Latest Ref Rng & Units 10/21/2018 10/14/2018 10/08/2018  WBC 4.0 - 10.5 K/uL 5.8 6.2 8.6  Hemoglobin 13.0 - 17.0 g/dL 10.9(L) 10.1(L) 10.4(L)  Hematocrit 39.0 - 52.0 % 34.7(L) 31.9(L) 33.3(L)  Platelets 150 - 400 K/uL 238 239 354   . CBC    Component Value Date/Time   WBC  5.8 10/21/2018 0811   RBC 4.53 10/21/2018 0811   HGB 10.9 (L) 10/21/2018 0811   HGB 10.7 (L) 04/07/2018 1429   HGB 9.5 (L) 03/05/2018 1206   HGB 13.1 11/07/2017 1014  HCT 34.7 (L) 10/21/2018 0811   HCT 32.3 (L) 03/05/2018 1206   HCT 39.7 11/07/2017 1014   PLT 238 10/21/2018 0811   PLT 637 (H) 04/07/2018 1429   PLT 474 (H) 03/05/2018 1206   MCV 76.6 (L) 10/21/2018 0811   MCV 82 03/05/2018 1206   MCV 87.6 11/07/2017 1014   MCH 24.1 (L) 10/21/2018 0811   MCHC 31.4 10/21/2018 0811   RDW 18.6 (H) 10/21/2018 0811   RDW 20.5 (H) 03/05/2018 1206   RDW 14.5 11/07/2017 1014   LYMPHSABS 1.3 10/21/2018 0811   LYMPHSABS 1.5 03/05/2018 1206   LYMPHSABS 1.7 11/07/2017 1014   MONOABS 1.1 (H) 10/21/2018 0811   MONOABS 0.8 11/07/2017 1014   EOSABS 0.1 10/21/2018 0811   EOSABS 0.4 03/05/2018 1206   BASOSABS 0.0 10/21/2018 0811   BASOSABS 0.1 03/05/2018 1206   BASOSABS 0.0 11/07/2017 1014    . CMP Latest Ref Rng & Units 10/14/2018 10/08/2018 10/02/2018  Glucose 70 - 99 mg/dL 90 61(L) 101(H)  BUN 6 - 20 mg/dL _0 Creatinine 0.61 - 1.24 mg/dL 1.03 0.90 0.79  Sodium 135 - 145 mmol/L 139 138 141  Potassium 3.5 - 5.1 mmol/L 3.4(L) 3.3(L) 3.8  Chloride 98 - 111 mmol/L 99 100 100  CO2 22 - 32 mmol/L 32 28 31  Calcium 8.9 - 10.3 mg/dL 8.7(L) 9.1 9.3  Total Protein 6.5 - 8.1 g/dL 6.5 7.0 7.6  Total Bilirubin 0.3 - 1.2 mg/dL 0.3 0.3 0.4  Alkaline Phos 38 - 126 U/L 70 72 81  AST 15 - 41 U/L _1 ALT 0 - 44 U/L _2 03/18/18 Cytogenetics:  03/18/18 Pathology:  11/03/17 Cytogenetics:    RADIOGRAPHIC STUDIES: I have personally reviewed the radiological images as listed and agreed with the findings in the report. No results found.  ASSESSMENT & PLAN:   57 y.o. male with  1. Multiple Myeloma multiple myeloma based on discovery of monoclonal gammopathy in the peripheral blood associated with a hypermetabolic lytic lesion in L4 and presence of 24% of plasma cell population  in the bone marrow.  Based on the diagnosis, patient was started on treatment with bortezomib & low-dose dexamethasone.  Initially, lenalidomide was omitted out of concern for high risk of for infectious complications.  We have continued to therapy with just a doublet treatment and did not reintensify the therapy.  Treatment course has been complicated by exacerbation of pulmonary hypertension and now with shingles reactivation BM 03/18/18 shows limited response with still 18% plasma cell in the bone marrow. Cytogenetics shows  14.5% [id number]  09/01/18 MRI Lumbar revealed L4 body abnormality is more consistent with Schmorl's node than myeloma. No definite myeloma. 2. Degenerative disease causes canal and biforaminal impingement at L4-5.   2. Recent pneumonia - now resolved.  PLAN:  -Discussed pt labwork today, 10/21/18; blood counts and chemistries are stable -Last available M Protein from 10/02/18 was improved to 0.6 -The pt has no prohibitive toxicities from continuing Revlimid two weeks on and one week off, at this time.  Continue Velcade and Dexamethasone as well.  -Continue 36m aspirin  -Grade I neuropathy, will continue to watch this and pt will let me know if this worsens -not needing pain medications for his back pain now. -Pt has not had h/o blood clots -continue Acyclovir prophylaxis -Continue Vitamin B complex  -Recommend that the pt follow up with PCP for Prevnar and Pneumovax every 5 years -Will see the pt  back in 4 weeks   -continue Weekly Velcade with labs -continue q4weekly zometa -RTC with Dr Irene Limbo in 4 weeks   All of the patients questions were answered with apparent satisfaction. The patient knows to call the clinic with any problems, questions or concerns.  The total time spent in the appt was 25 minutes and more than 50% was on counseling and direct patient cares.    Sullivan Lone MD MS AAHIVMS [facility] Hematology/Oncology Physician [facility]  (Office):       [phone] (Work cell):  [phone] (Fax):           [phone]  10/21/2018 8:50 AM  I, Baldwin Jamaica, am acting as a scribe for Dr. Sullivan Lone.   .I have reviewed the above documentation for accuracy and completeness, and I agree with the above. Brunetta Genera MD

## 2018-10-21 ENCOUNTER — Inpatient Hospital Stay: Payer: Medicare Other

## 2018-10-21 ENCOUNTER — Inpatient Hospital Stay (HOSPITAL_BASED_OUTPATIENT_CLINIC_OR_DEPARTMENT_OTHER): Payer: Medicare Other | Admitting: Hematology

## 2018-10-21 VITALS — BP 105/65 | HR 74 | Temp 97.7°F | Resp 18 | Ht 61.0 in | Wt 85.8 lb

## 2018-10-21 DIAGNOSIS — C9000 Multiple myeloma not having achieved remission: Secondary | ICD-10-CM

## 2018-10-21 DIAGNOSIS — Z7982 Long term (current) use of aspirin: Secondary | ICD-10-CM

## 2018-10-21 DIAGNOSIS — M545 Low back pain: Secondary | ICD-10-CM

## 2018-10-21 DIAGNOSIS — Z7189 Other specified counseling: Secondary | ICD-10-CM

## 2018-10-21 DIAGNOSIS — Z5112 Encounter for antineoplastic immunotherapy: Secondary | ICD-10-CM | POA: Diagnosis not present

## 2018-10-21 DIAGNOSIS — B029 Zoster without complications: Secondary | ICD-10-CM

## 2018-10-21 DIAGNOSIS — G629 Polyneuropathy, unspecified: Secondary | ICD-10-CM

## 2018-10-21 LAB — COMPREHENSIVE METABOLIC PANEL
ALBUMIN: 3 g/dL — AB (ref 3.5–5.0)
ALT: 14 U/L (ref 0–44)
AST: 16 U/L (ref 15–41)
Alkaline Phosphatase: 62 U/L (ref 38–126)
Anion gap: 9 (ref 5–15)
BILIRUBIN TOTAL: 0.4 mg/dL (ref 0.3–1.2)
BUN: 12 mg/dL (ref 6–20)
CO2: 27 mmol/L (ref 22–32)
Calcium: 8.5 mg/dL — ABNORMAL LOW (ref 8.9–10.3)
Chloride: 104 mmol/L (ref 98–111)
Creatinine, Ser: 0.9 mg/dL (ref 0.61–1.24)
GFR calc Af Amer: >60 mL/min (ref >60)
GFR calc non Af Amer: >60 mL/min (ref >60)
Glucose, Bld: 88 mg/dL (ref 70–99)
Potassium: 3.7 mmol/L (ref 3.5–5.1)
Sodium: 140 mmol/L (ref 135–145)
Total Protein: 6.6 g/dL (ref 6.5–8.1)

## 2018-10-21 LAB — CBC WITH DIFFERENTIAL/PLATELET
ABS IMMATURE GRANULOCYTES: 0.02 10*3/uL (ref 0.00–0.07)
BASOS PCT: 0 %
Basophils Absolute: 0 10*3/uL (ref 0.0–0.1)
Eosinophils Absolute: 0.1 10*3/uL (ref 0.0–0.5)
Eosinophils Relative: 1 %
HCT: 34.7 % — ABNORMAL LOW (ref 39.0–52.0)
Hemoglobin: 10.9 g/dL — ABNORMAL LOW (ref 13.0–17.0)
Immature Granulocytes: 0 %
Lymphocytes Relative: 22 %
Lymphs Abs: 1.3 10*3/uL (ref 0.7–4.0)
MCH: 24.1 pg — ABNORMAL LOW (ref 26.0–34.0)
MCHC: 31.4 g/dL (ref 30.0–36.0)
MCV: 76.6 fL — ABNORMAL LOW (ref 80.0–100.0)
Monocytes Absolute: 1.1 10*3/uL — ABNORMAL HIGH (ref 0.1–1.0)
Monocytes Relative: 19 %
Neutro Abs: 3.4 10*3/uL (ref 1.7–7.7)
Neutrophils Relative %: 58 %
PLATELETS: 238 10*3/uL (ref 150–400)
RBC: 4.53 MIL/uL (ref 4.22–5.81)
RDW: 18.6 % — ABNORMAL HIGH (ref 11.5–15.5)
WBC: 5.8 10*3/uL (ref 4.0–10.5)
nRBC: 0 % (ref 0.0–0.2)

## 2018-10-21 MED ORDER — PROCHLORPERAZINE MALEATE 10 MG PO TABS
ORAL_TABLET | ORAL | Status: AC
  Filled 2018-10-21: qty 1

## 2018-10-21 MED ORDER — ZOLEDRONIC ACID 4 MG/5ML IV CONC
3.5000 mg | Freq: Once | INTRAVENOUS | Status: AC
  Administered 2018-10-21: 3.5 mg via INTRAVENOUS
  Filled 2018-10-21: qty 4.38

## 2018-10-21 MED ORDER — PROCHLORPERAZINE MALEATE 10 MG PO TABS
10.0000 mg | ORAL_TABLET | Freq: Once | ORAL | Status: AC
  Administered 2018-10-21: 10 mg via ORAL

## 2018-10-21 MED ORDER — BORTEZOMIB CHEMO SQ INJECTION 3.5 MG (2.5MG/ML)
1.3000 mg/m2 | Freq: Once | INTRAMUSCULAR | Status: AC
  Administered 2018-10-21: 1.75 mg via SUBCUTANEOUS
  Filled 2018-10-21: qty 0.7

## 2018-10-21 MED ORDER — SODIUM CHLORIDE 0.9 % IV SOLN
INTRAVENOUS | Status: DC
  Administered 2018-10-21: 10:00:00 via INTRAVENOUS
  Filled 2018-10-21: qty 250

## 2018-10-21 NOTE — Patient Instructions (Addendum)
[facility] Discharge Instructions for Patients Receiving Chemotherapy  Today you received the following chemotherapy agents Bortezomib (VELCADE) & Zoledronic Acid (ZOMETA).  To help prevent nausea and vomiting after your treatment, we encourage you to take your nausea medication as prescribed.   If you develop nausea and vomiting that is not controlled by your nausea medication, call the clinic.   BELOW ARE SYMPTOMS THAT SHOULD BE REPORTED IMMEDIATELY:  *FEVER GREATER THAN 100.5 F  *CHILLS WITH OR WITHOUT FEVER  NAUSEA AND VOMITING THAT IS NOT CONTROLLED WITH YOUR NAUSEA MEDICATION  *UNUSUAL SHORTNESS OF BREATH  *UNUSUAL BRUISING OR BLEEDING  TENDERNESS IN MOUTH AND THROAT WITH OR WITHOUT PRESENCE OF ULCERS  *URINARY PROBLEMS  *BOWEL PROBLEMS  UNUSUAL RASH Items with * indicate a potential emergency and should be followed up as soon as possible.  Feel free to call the clinic should you have any questions or concerns. The clinic phone number is [phone].  Please show the [place] at check-in to the Emergency Department and triage nurse.

## 2018-10-21 NOTE — Telephone Encounter (Signed)
Oral Oncology Patient Advocate Encounter  I met with the patient today as he was leaving infusion and he had his 2020 social security award letter with him. I made a copy of it and faxed his [employer] renewal application today 67/73/73.  This encounter will be updated until final determination  [facility] Phone [phone] Fax [phone]

## 2018-10-27 ENCOUNTER — Inpatient Hospital Stay: Payer: Medicare Other

## 2018-10-27 VITALS — BP 101/62 | HR 83 | Temp 98.8°F | Resp 18

## 2018-10-27 DIAGNOSIS — Z5112 Encounter for antineoplastic immunotherapy: Secondary | ICD-10-CM | POA: Diagnosis not present

## 2018-10-27 DIAGNOSIS — C9000 Multiple myeloma not having achieved remission: Secondary | ICD-10-CM

## 2018-10-27 LAB — CMP (CANCER CENTER ONLY)
ALT: 11 U/L (ref 0–44)
AST: 16 U/L (ref 15–41)
Albumin: 3.1 g/dL — ABNORMAL LOW (ref 3.5–5.0)
Alkaline Phosphatase: 55 U/L (ref 38–126)
Anion gap: 10 (ref 5–15)
BUN: 12 mg/dL (ref 6–20)
CO2: 23 mmol/L (ref 22–32)
Calcium: 8.8 mg/dL — ABNORMAL LOW (ref 8.9–10.3)
Chloride: 104 mmol/L (ref 98–111)
Creatinine: 0.95 mg/dL (ref 0.61–1.24)
GFR, Est AFR Am: >60 mL/min (ref >60)
GFR, Estimated: >60 mL/min (ref >60)
Glucose, Bld: 151 mg/dL — ABNORMAL HIGH (ref 70–99)
POTASSIUM: 3.5 mmol/L (ref 3.5–5.1)
SODIUM: 137 mmol/L (ref 135–145)
Total Bilirubin: <0.2 mg/dL — ABNORMAL LOW (ref 0.3–1.2)
Total Protein: 7 g/dL (ref 6.5–8.1)

## 2018-10-27 LAB — CBC WITH DIFFERENTIAL/PLATELET
Abs Immature Granulocytes: 0.03 10*3/uL (ref 0.00–0.07)
Basophils Absolute: 0 10*3/uL (ref 0.0–0.1)
Basophils Relative: 0 %
Eosinophils Absolute: 0 10*3/uL (ref 0.0–0.5)
Eosinophils Relative: 0 %
HCT: 30 % — ABNORMAL LOW (ref 39.0–52.0)
Hemoglobin: 9.5 g/dL — ABNORMAL LOW (ref 13.0–17.0)
Immature Granulocytes: 0 %
Lymphocytes Relative: 8 %
Lymphs Abs: 0.6 10*3/uL — ABNORMAL LOW (ref 0.7–4.0)
MCH: 24.7 pg — ABNORMAL LOW (ref 26.0–34.0)
MCHC: 31.7 g/dL (ref 30.0–36.0)
MCV: 77.9 fL — ABNORMAL LOW (ref 80.0–100.0)
Monocytes Absolute: 1 10*3/uL (ref 0.1–1.0)
Monocytes Relative: 12 %
NRBC: 0 % (ref 0.0–0.2)
Neutro Abs: 6.8 10*3/uL (ref 1.7–7.7)
Neutrophils Relative %: 80 %
Platelets: 384 10*3/uL (ref 150–400)
RBC: 3.85 MIL/uL — ABNORMAL LOW (ref 4.22–5.81)
RDW: 18.8 % — AB (ref 11.5–15.5)
WBC: 8.5 10*3/uL (ref 4.0–10.5)

## 2018-10-27 MED ORDER — PROCHLORPERAZINE MALEATE 10 MG PO TABS
ORAL_TABLET | ORAL | Status: AC
  Filled 2018-10-27: qty 1

## 2018-10-27 MED ORDER — PROCHLORPERAZINE MALEATE 10 MG PO TABS
10.0000 mg | ORAL_TABLET | Freq: Once | ORAL | Status: AC
  Administered 2018-10-27: 10 mg via ORAL

## 2018-10-27 MED ORDER — BORTEZOMIB CHEMO SQ INJECTION 3.5 MG (2.5MG/ML)
1.3000 mg/m2 | Freq: Once | INTRAMUSCULAR | Status: AC
  Administered 2018-10-27: 1.75 mg via SUBCUTANEOUS
  Filled 2018-10-27: qty 0.7

## 2018-10-27 MED FILL — DEXAMETHASONE 4 MG TABLET: 4 | 28 days supply | Qty: 20 | Fill #1

## 2018-10-27 MED FILL — ONDANSETRON HCL 8 MG TABLET: 8 | 10 days supply | Qty: 30 | Fill #1

## 2018-10-27 NOTE — Patient Instructions (Signed)
[facility] Discharge Instructions for Patients Receiving Chemotherapy  Today you received the following chemotherapy agents Velcade To help prevent nausea and vomiting after your treatment, we encourage you to take your nausea medication as prescribed.   If you develop nausea and vomiting that is not controlled by your nausea medication, call the clinic.   BELOW ARE SYMPTOMS THAT SHOULD BE REPORTED IMMEDIATELY:  *FEVER GREATER THAN 100.5 F  *CHILLS WITH OR WITHOUT FEVER  NAUSEA AND VOMITING THAT IS NOT CONTROLLED WITH YOUR NAUSEA MEDICATION  *UNUSUAL SHORTNESS OF BREATH  *UNUSUAL BRUISING OR BLEEDING  TENDERNESS IN MOUTH AND THROAT WITH OR WITHOUT PRESENCE OF ULCERS  *URINARY PROBLEMS  *BOWEL PROBLEMS  UNUSUAL RASH Items with * indicate a potential emergency and should be followed up as soon as possible.  Feel free to call the clinic should you have any questions or concerns. The clinic phone number is [phone].  Please show the CHEMO ALERT CARD at check-in to the Emergency Department and triage nurse.   

## 2018-11-03 ENCOUNTER — Telehealth: Payer: Self-pay | Admitting: *Deleted

## 2018-11-03 ENCOUNTER — Inpatient Hospital Stay: Payer: Medicare Other

## 2018-11-03 VITALS — BP 121/71 | HR 76 | Temp 98.2°F | Resp 16 | Wt 87.4 lb

## 2018-11-03 DIAGNOSIS — Z5112 Encounter for antineoplastic immunotherapy: Secondary | ICD-10-CM | POA: Diagnosis not present

## 2018-11-03 DIAGNOSIS — C9000 Multiple myeloma not having achieved remission: Secondary | ICD-10-CM

## 2018-11-03 LAB — CBC WITH DIFFERENTIAL/PLATELET
Abs Immature Granulocytes: 0.05 10*3/uL (ref 0.00–0.07)
BASOS PCT: 0 %
Basophils Absolute: 0 10*3/uL (ref 0.0–0.1)
Eosinophils Absolute: 0 10*3/uL (ref 0.0–0.5)
Eosinophils Relative: 0 %
HCT: 32.2 % — ABNORMAL LOW (ref 39.0–52.0)
Hemoglobin: 10.3 g/dL — ABNORMAL LOW (ref 13.0–17.0)
IMMATURE GRANULOCYTES: 0 %
Lymphocytes Relative: 10 %
Lymphs Abs: 1.3 10*3/uL (ref 0.7–4.0)
MCH: 24.5 pg — ABNORMAL LOW (ref 26.0–34.0)
MCHC: 32 g/dL (ref 30.0–36.0)
MCV: 76.5 fL — ABNORMAL LOW (ref 80.0–100.0)
MONOS PCT: 11 %
Monocytes Absolute: 1.3 10*3/uL — ABNORMAL HIGH (ref 0.1–1.0)
NEUTROS PCT: 79 %
Neutro Abs: 9.6 10*3/uL — ABNORMAL HIGH (ref 1.7–7.7)
Platelets: 290 10*3/uL (ref 150–400)
RBC: 4.21 MIL/uL — ABNORMAL LOW (ref 4.22–5.81)
RDW: 18.2 % — AB (ref 11.5–15.5)
WBC: 12.3 10*3/uL — ABNORMAL HIGH (ref 4.0–10.5)
nRBC: 0 % (ref 0.0–0.2)

## 2018-11-03 LAB — CMP (CANCER CENTER ONLY)
ALT: 14 U/L (ref 0–44)
AST: 22 U/L (ref 15–41)
Albumin: 3.2 g/dL — ABNORMAL LOW (ref 3.5–5.0)
Alkaline Phosphatase: 59 U/L (ref 38–126)
Anion gap: 12 (ref 5–15)
BUN: 13 mg/dL (ref 6–20)
CO2: 22 mmol/L (ref 22–32)
Calcium: 8.2 mg/dL — ABNORMAL LOW (ref 8.9–10.3)
Chloride: 102 mmol/L (ref 98–111)
Creatinine: 0.89 mg/dL (ref 0.61–1.24)
GFR, Est AFR Am: >60 mL/min (ref >60)
Glucose, Bld: 100 mg/dL — ABNORMAL HIGH (ref 70–99)
Potassium: 3 mmol/L — CL (ref 3.5–5.1)
Sodium: 136 mmol/L (ref 135–145)
Total Bilirubin: 0.4 mg/dL (ref 0.3–1.2)
Total Protein: 7.1 g/dL (ref 6.5–8.1)

## 2018-11-03 MED ORDER — PROCHLORPERAZINE MALEATE 10 MG PO TABS
10.0000 mg | ORAL_TABLET | Freq: Once | ORAL | Status: AC
  Administered 2018-11-03: 10 mg via ORAL

## 2018-11-03 MED ORDER — BORTEZOMIB CHEMO SQ INJECTION 3.5 MG (2.5MG/ML)
1.3000 mg/m2 | Freq: Once | INTRAMUSCULAR | Status: AC
  Administered 2018-11-03: 1.75 mg via SUBCUTANEOUS
  Filled 2018-11-03: qty 0.7

## 2018-11-03 MED ORDER — PROCHLORPERAZINE MALEATE 10 MG PO TABS
ORAL_TABLET | ORAL | Status: AC
  Filled 2018-11-03: qty 1

## 2018-11-03 NOTE — Telephone Encounter (Signed)
Notified by [facility] lab that potassium 3.0 today. Dr. Alen Blew informed. No treatment ordered. Patient in Infusion Room receiving treatment. Jobe Igo RN providing care in Infusion informed.

## 2018-11-03 NOTE — Patient Instructions (Signed)
[facility] Discharge Instructions for Patients Receiving Chemotherapy  Today you received the following chemotherapy agents Bortezomib (VELCADE).  To help prevent nausea and vomiting after your treatment, we encourage you to take your nausea medication as prescribed.   If you develop nausea and vomiting that is not controlled by your nausea medication, call the clinic.   BELOW ARE SYMPTOMS THAT SHOULD BE REPORTED IMMEDIATELY:  *FEVER GREATER THAN 100.5 F  *CHILLS WITH OR WITHOUT FEVER  NAUSEA AND VOMITING THAT IS NOT CONTROLLED WITH YOUR NAUSEA MEDICATION  *UNUSUAL SHORTNESS OF BREATH  *UNUSUAL BRUISING OR BLEEDING  TENDERNESS IN MOUTH AND THROAT WITH OR WITHOUT PRESENCE OF ULCERS  *URINARY PROBLEMS  *BOWEL PROBLEMS  UNUSUAL RASH Items with * indicate a potential emergency and should be followed up as soon as possible.  Feel free to call the clinic should you have any questions or concerns. The clinic phone number is [phone].  Please show the CHEMO ALERT CARD at check-in to the Emergency Department and triage nurse.   

## 2018-11-05 LAB — MULTIPLE MYELOMA PANEL, SERUM
ALBUMIN/GLOB SERPL: 1.1 (ref 0.7–1.7)
Albumin SerPl Elph-Mcnc: 3.3 g/dL (ref 2.9–4.4)
Alpha 1: 0.2 g/dL (ref 0.0–0.4)
Alpha2 Glob SerPl Elph-Mcnc: 0.8 g/dL (ref 0.4–1.0)
B-Globulin SerPl Elph-Mcnc: 1 g/dL (ref 0.7–1.3)
Gamma Glob SerPl Elph-Mcnc: 1.3 g/dL (ref 0.4–1.8)
Globulin, Total: 3.3 g/dL (ref 2.2–3.9)
IGM (IMMUNOGLOBULIN M), SRM: 40 mg/dL (ref 20–172)
IgA: 142 mg/dL (ref 90–386)
IgG (Immunoglobin G), Serum: 1416 mg/dL (ref 700–1600)
M Protein SerPl Elph-Mcnc: 0.7 g/dL — ABNORMAL HIGH
Total Protein ELP: 6.6 g/dL (ref 6.0–8.5)

## 2018-11-11 ENCOUNTER — Inpatient Hospital Stay: Payer: Medicare Other

## 2018-11-11 ENCOUNTER — Inpatient Hospital Stay: Payer: Medicare Other | Attending: Hematology

## 2018-11-11 VITALS — BP 98/64 | HR 79 | Temp 98.2°F | Resp 16

## 2018-11-11 DIAGNOSIS — G629 Polyneuropathy, unspecified: Secondary | ICD-10-CM | POA: Diagnosis not present

## 2018-11-11 DIAGNOSIS — C9000 Multiple myeloma not having achieved remission: Secondary | ICD-10-CM

## 2018-11-11 DIAGNOSIS — Z7982 Long term (current) use of aspirin: Secondary | ICD-10-CM | POA: Insufficient documentation

## 2018-11-11 DIAGNOSIS — Z5112 Encounter for antineoplastic immunotherapy: Secondary | ICD-10-CM | POA: Diagnosis not present

## 2018-11-11 LAB — CBC WITH DIFFERENTIAL/PLATELET
Abs Immature Granulocytes: 0.04 10*3/uL (ref 0.00–0.07)
BASOS PCT: 1 %
Basophils Absolute: 0.1 10*3/uL (ref 0.0–0.1)
EOS ABS: 0.1 10*3/uL (ref 0.0–0.5)
Eosinophils Relative: 2 %
HCT: 32.7 % — ABNORMAL LOW (ref 39.0–52.0)
Hemoglobin: 10.4 g/dL — ABNORMAL LOW (ref 13.0–17.0)
Immature Granulocytes: 1 %
Lymphocytes Relative: 16 %
Lymphs Abs: 1.3 10*3/uL (ref 0.7–4.0)
MCH: 24.7 pg — ABNORMAL LOW (ref 26.0–34.0)
MCHC: 31.8 g/dL (ref 30.0–36.0)
MCV: 77.7 fL — ABNORMAL LOW (ref 80.0–100.0)
Monocytes Absolute: 1.5 10*3/uL — ABNORMAL HIGH (ref 0.1–1.0)
Monocytes Relative: 19 %
Neutro Abs: 5 10*3/uL (ref 1.7–7.7)
Neutrophils Relative %: 61 %
Platelets: 239 10*3/uL (ref 150–400)
RBC: 4.21 MIL/uL — ABNORMAL LOW (ref 4.22–5.81)
RDW: 18.6 % — ABNORMAL HIGH (ref 11.5–15.5)
WBC: 8 10*3/uL (ref 4.0–10.5)
nRBC: 0 % (ref 0.0–0.2)

## 2018-11-11 LAB — CMP (CANCER CENTER ONLY)
ALT: 12 U/L (ref 0–44)
AST: 21 U/L (ref 15–41)
Albumin: 3.2 g/dL — ABNORMAL LOW (ref 3.5–5.0)
Alkaline Phosphatase: 59 U/L (ref 38–126)
Anion gap: 8 (ref 5–15)
BUN: 10 mg/dL (ref 6–20)
CO2: 30 mmol/L (ref 22–32)
Calcium: 8.7 mg/dL — ABNORMAL LOW (ref 8.9–10.3)
Chloride: 105 mmol/L (ref 98–111)
Creatinine: 0.89 mg/dL (ref 0.61–1.24)
GFR, Est AFR Am: >60 mL/min (ref >60)
GFR, Estimated: >60 mL/min (ref >60)
Glucose, Bld: 61 mg/dL — ABNORMAL LOW (ref 70–99)
Potassium: 3.5 mmol/L (ref 3.5–5.1)
Sodium: 143 mmol/L (ref 135–145)
TOTAL PROTEIN: 6.6 g/dL (ref 6.5–8.1)
Total Bilirubin: 0.5 mg/dL (ref 0.3–1.2)

## 2018-11-11 LAB — GLUCOSE, CAPILLARY: Glucose-Capillary: 81 mg/dL (ref 70–99)

## 2018-11-11 MED ORDER — PROCHLORPERAZINE MALEATE 10 MG PO TABS
10.0000 mg | ORAL_TABLET | Freq: Once | ORAL | Status: AC
  Administered 2018-11-11: 10 mg via ORAL

## 2018-11-11 MED ORDER — PROCHLORPERAZINE MALEATE 10 MG PO TABS
ORAL_TABLET | ORAL | Status: AC
  Filled 2018-11-11: qty 1

## 2018-11-11 MED ORDER — BORTEZOMIB CHEMO SQ INJECTION 3.5 MG (2.5MG/ML)
1.3000 mg/m2 | Freq: Once | INTRAMUSCULAR | Status: AC
  Administered 2018-11-11: 1.75 mg via SUBCUTANEOUS
  Filled 2018-11-11: qty 0.7

## 2018-11-11 NOTE — Progress Notes (Signed)
Pt BG on CMP 60. Fingerstick recheck BG at 81. Pt provided with juice and snacks. Dr. Irene Limbo made aware. Ok to treat.  Will continue to follow.

## 2018-11-11 NOTE — Progress Notes (Signed)
Patient brought paperwork received at his home for a medication program to supply his Velcade. Patient stated Dr. Irene Limbo needs to sign paperwork. Per Lance Coon in pharmacy, patient has been enrolled and approved for a program and may not need additional paperwork signed at this time. Patient will bring ppwk to next MD appt for further review and action if needed.

## 2018-11-11 NOTE — Patient Instructions (Signed)
[facility] Discharge Instructions for Patients Receiving Chemotherapy  Today you received the following chemotherapy agents Bortezomib (VELCADE).  To help prevent nausea and vomiting after your treatment, we encourage you to take your nausea medication as prescribed.   If you develop nausea and vomiting that is not controlled by your nausea medication, call the clinic.   BELOW ARE SYMPTOMS THAT SHOULD BE REPORTED IMMEDIATELY:  *FEVER GREATER THAN 100.5 F  *CHILLS WITH OR WITHOUT FEVER  NAUSEA AND VOMITING THAT IS NOT CONTROLLED WITH YOUR NAUSEA MEDICATION  *UNUSUAL SHORTNESS OF BREATH  *UNUSUAL BRUISING OR BLEEDING  TENDERNESS IN MOUTH AND THROAT WITH OR WITHOUT PRESENCE OF ULCERS  *URINARY PROBLEMS  *BOWEL PROBLEMS  UNUSUAL RASH Items with * indicate a potential emergency and should be followed up as soon as possible.  Feel free to call the clinic should you have any questions or concerns. The clinic phone number is [phone].  Please show the CHEMO ALERT CARD at check-in to the Emergency Department and triage nurse.   

## 2018-11-13 LAB — MULTIPLE MYELOMA PANEL, SERUM
Albumin SerPl Elph-Mcnc: 3.4 g/dL (ref 2.9–4.4)
Albumin/Glob SerPl: 1.2 (ref 0.7–1.7)
Alpha 1: 0.2 g/dL (ref 0.0–0.4)
Alpha2 Glob SerPl Elph-Mcnc: 0.7 g/dL (ref 0.4–1.0)
B-Globulin SerPl Elph-Mcnc: 0.8 g/dL (ref 0.7–1.3)
Gamma Glob SerPl Elph-Mcnc: 1.3 g/dL (ref 0.4–1.8)
Globulin, Total: 3 g/dL (ref 2.2–3.9)
IgA: 116 mg/dL (ref 90–386)
IgG (Immunoglobin G), Serum: 1369 mg/dL (ref 700–1600)
IgM (Immunoglobulin M), Srm: 33 mg/dL (ref 20–172)
M Protein SerPl Elph-Mcnc: 0.7 g/dL — ABNORMAL HIGH
Total Protein ELP: 6.4 g/dL (ref 6.0–8.5)

## 2018-11-17 NOTE — Progress Notes (Signed)
HEMATOLOGY/ONCOLOGY CONSULTATION NOTE  Date of Service: 11/18/2018  Patient Care Team: Tawny Asal as PCP - General (Physician Assistant) Michel Bickers, MD as Consulting Physician (Infectious Diseases) Brand Males, MD as Consulting Physician (Pulmonary Disease)  CHIEF COMPLAINTS/PURPOSE OF CONSULTATION:  Multiple myeloma  Oncologic History:  58 y.o.  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  Based on the diagnosis, patient was started on treatment with bortezomib & low-dose dexamethasone.  Initially, lenalidomide was omitted out of concern for high risk of for infectious complications.  We have continued to therapy with just a doublet treatment and did not reintensify the therapy.  Treatment course has been complicated by exacerbation of pulmonary hypertension and now with shingles reactivation resulting in blistering rash and skin ulcerations on the left flank.  Infection appears to be responding to higher dose of acyclovir and skin is healing.   HISTORY OF PRESENTING ILLNESS:   [name] [name] is a wonderful 58 y.o. male who has been referred to [place] by my colleague Dr [name] for evaluation and management of Multiple myeloma. He is accompanied today by a Optometrist. The pt reports that he is doing well overall.   The pt has been followed by my colleague Dr [name] and has been treated with Velcade and dexamethasone.    The pt reports that he has lost some weight and hasn't eaten as much because he hasn't been able to taste his food as well.   He notes that his shingles rash has continued to clear up and he denies pain associated with his remaining rash. His back pain continues and he has run out of his Hydromorphone. He notes that his back  pain has not gotten better since he was first evaluated. He notes that he feels tired and has a little SOB.   Most recent lab results (04/07/18) of CBC and CMP is as follows: all values are WNL except for Sodium at 134, chloride at 96, Total Protein at 8.7, Albumin at 3.0, Total bilirubin <0.2, WBC at 19.1k, HGB at 10.7, HCT at 33.2, MCV at 73.5, MCH at 23.7, RDW at 18.4, Platelets at 637k, ANC at 17.1k.Marland Kitchen MMP 04/07/18 showed IgG elevated at 2301 and M Protein at 1.5. Ferritin 04/07/18 was elevated at 436 Iron/TIBC 04/07/18 showed at 12% saturation ratio LDH 04/07/18 is WNL at 171 Kappa/Lambda 04/07/18 showed Kappa at 25.2 and K:L ratio at 2.27  On review of systems, pt reports some SOB, back pain, resolving shingles rash, mouth sores, weight loss, and denies fevers, chills, new skin rashes, and any other symptoms.  Interval History:    [name] [name] returns today for management and evaluation of his multiple myeloma. The patient's last visit with [place] was on 10/21/18. He is accompanied today by an interpreter. The pt reports that he is doing well overall.   The pt reports that he has not developed any new concerns in the interim. He denies fevers or cough, and notes that his back pain is better but has some muscular neck pain which has improved with warm compresses. He also notes that he has been recently eating better than his normal.  Lab results today (11/18/18) of CBC w/diff and CMP is as follows: all values are WNL except for RBC at 4.08, HGB at 10.2, HCT  at 32.1, MCV at 78.7, MCH at 25.0, RDW at 18.1, Monocytes abs at 1.6k, Calcium at 8.3, Total Protein at 6.2, Albumin at 3.1.  On review of systems, pt reports good energy levels, eating better, mild tingling in feet, stable weight, and denies back pain, fevers, cough, pain along the spine, chills, night sweats, abdominal pains, leg swelling, and any other symptoms.   MEDICAL HISTORY:  Past Medical History:  Diagnosis Date  . Acute on chronic respiratory  failure ([place]) 02/07/2018  . Hypertension   . Kidney stones   . Pulmonary hypertension ([place])     SURGICAL HISTORY: Past Surgical History:  Procedure Laterality Date  . CARDIAC CATHETERIZATION N/A 01/18/2016   Procedure: Right Heart Cath;  Surgeon: Jolaine Artist, MD;  Location: [facility];  Service: Cardiovascular;  Laterality: N/A;  . CARDIAC CATHETERIZATION N/A 06/24/2016   Procedure: Right Heart Cath;  Surgeon: Jolaine Artist, MD;  Location: [facility];  Service: Cardiovascular;  Laterality: N/A;  . IR RADIOLOGIST EVAL & MGMT  05/27/2018  . IR RADIOLOGIST EVAL & MGMT  09/01/2018  . KIDNEY SURGERY    . VIDEO ASSISTED THORACOSCOPY (VATS)/DECORTICATION Left 2003  . VIDEO ASSISTED THORACOSCOPY (VATS)/DECORTICATION Left 10/02/2015   Procedure: VIDEO ASSISTED THORACOSCOPY (VATS)/DECORTICATION and drainage of chronic empyena;  Surgeon: [name], MD;  Location: [facility];  Service: Thoracic;  Laterality: Left;  Marland Kitchen VIDEO BRONCHOSCOPY N/A 10/02/2015   Procedure: VIDEO BRONCHOSCOPY;  Surgeon: [name], MD;  Location: [facility];  Service: Thoracic;  Laterality: N/A;    SOCIAL HISTORY: Social History   Socioeconomic History  . Marital status: Single    Spouse name: Not on file  . Number of children: Not on file  . Years of education: Not on file  . Highest education level: Not on file  Occupational History  . Not on file  Social Needs  . Financial resource strain: Not on file  . Food insecurity:    Worry: Not on file    Inability: Not on file  . Transportation needs:    Medical: Not on file    Non-medical: Not on file  Tobacco Use  . Smoking status: Never Smoker  . Smokeless tobacco: Never Used  Substance and Sexual Activity  . Alcohol use: Yes  . Drug use: No  . Sexual activity: Not on file  Lifestyle  . Physical activity:    Days per week: Not on file    Minutes per session: Not on file  . Stress: Not on file  Relationships  . Social  connections:    Talks on phone: Not on file    Gets together: Not on file    Attends religious service: Not on file    Active member of club or organization: Not on file    Attends meetings of clubs or organizations: Not on file    Relationship status: Not on file  . Intimate partner violence:    Fear of current or ex partner: Not on file    Emotionally abused: Not on file    Physically abused: Not on file    Forced sexual activity: Not on file  Other Topics Concern  . Not on file  Social History Narrative  . Not on file    FAMILY HISTORY: Family History  Problem Relation Age of Onset  . Hypertension Other     ALLERGIES:  has No Known Allergies.  MEDICATIONS:  Current Outpatient Medications  Medication Sig Dispense Refill  .  acyclovir (ZOVIRAX) 400 MG tablet Take 1 tablet (400 mg total) by mouth daily. 30 tablet 3  . aspirin EC 81 MG tablet Take 1 tablet (81 mg total) by mouth daily. 60 tablet 11  . dexamethasone (DECADRON) 4 MG tablet Take 5 tablets (20 mg total) by mouth once a week. 20 tablet 3  . docusate sodium (COLACE) 50 MG capsule Take 1 capsule (50 mg total) by mouth daily. 30 capsule 0  . Fluticasone-Umeclidin-Vilant (TRELEGY ELLIPTA) 100-62.5-25 MCG/INH AEPB Inhale 1 puff into the lungs daily. 1 each 5  . guaiFENesin (MUCINEX) 600 MG 12 hr tablet Take 2 tablets (1,200 mg total) by mouth 2 (two) times daily. 20 tablet 0  . lenalidomide (REVLIMID) 25 MG capsule Take 1 capsule (25 mg total) by mouth daily. Take for 21 days and take 7 days off. Authorization#[id number] 10/19/18 21 capsule 3  . nortriptyline (PAMELOR) 25 MG capsule Take 1 capsule (25 mg total) by mouth at bedtime. 30 capsule 3  . omeprazole (PRILOSEC) 40 MG capsule Take 1 capsule (40 mg total) by mouth daily. 30 capsule 2  . ondansetron (ZOFRAN) 8 MG tablet Take 1 tablet (8 mg total) by mouth every 8 (eight) hours as needed for nausea. 30 tablet 3  . oxyCODONE-acetaminophen (PERCOCET/ROXICET) 5-325 MG tablet  Take 1 tablet by mouth every 6 (six) hours as needed for severe pain. 30 tablet 0  . OXYGEN 2lpm BEDTIME ONLY    . PROVENTIL HFA 108 (90 Base) MCG/ACT inhaler INHALE 2 PUFFS EVERY 6 HOURS AS NEEDED FOR WHEEZING OR SHORTNESS OF BREATH. 3 each 0   No current facility-administered medications for this visit.     REVIEW OF SYSTEMS:    A 10+ POINT REVIEW OF SYSTEMS WAS OBTAINED including neurology, dermatology, psychiatry, cardiac, respiratory, lymph, extremities, GI, GU, Musculoskeletal, constitutional, breasts, reproductive, HEENT.  All pertinent positives are noted in the HPI.  All others are negative.   PHYSICAL EXAMINATION: ECOG PERFORMANCE STATUS: 2 - Symptomatic, <50% confined to bed  Vitals:   11/18/18 1006  BP: 112/74  Pulse: 65  Resp: 18  Temp: 98 F (36.7 C)  SpO2: 100%   Filed Weights   11/18/18 1006  Weight: 83 lb 14.4 oz (38.1 kg)   .Body mass index is 15.85 kg/m.  GENERAL:alert, in no acute distress and comfortable SKIN: no acute rashes, no significant lesions EYES: conjunctiva are pink and non-injected, sclera anicteric OROPHARYNX: MMM, no exudates, no oropharyngeal erythema or ulceration NECK: supple, no JVD LYMPH:  no palpable lymphadenopathy in the cervical, axillary or inguinal regions LUNGS: clear to auscultation b/l with normal respiratory effort HEART: regular rate & rhythm ABDOMEN:  normoactive bowel sounds , non tender, not distended. No palpable hepatosplenomegaly.  Extremity: no pedal edema PSYCH: alert & oriented x 3 with fluent speech NEURO: no focal motor/sensory deficits   LABORATORY DATA:  I have reviewed the data as listed  . CBC Latest Ref Rng & Units 11/18/2018 11/11/2018 11/03/2018  WBC 4.0 - 10.5 K/uL 8.4 8.0 12.3(H)  Hemoglobin 13.0 - 17.0 g/dL 10.2(L) 10.4(L) 10.3(L)  Hematocrit 39.0 - 52.0 % 32.1(L) 32.7(L) 32.2(L)  Platelets 150 - 400 K/uL 191 239 290   . CBC    Component Value Date/Time   WBC 8.4 11/18/2018 0910   RBC 4.08  (L) 11/18/2018 0910   HGB 10.2 (L) 11/18/2018 0910   HGB 10.7 (L) 04/07/2018 1429   HGB 9.5 (L) 03/05/2018 1206   HGB 13.1 11/07/2017 1014   HCT 32.1 (L)  11/18/2018 0910   HCT 32.3 (L) 03/05/2018 1206   HCT 39.7 11/07/2017 1014   PLT 191 11/18/2018 0910   PLT 637 (H) 04/07/2018 1429   PLT 474 (H) 03/05/2018 1206   MCV 78.7 (L) 11/18/2018 0910   MCV 82 03/05/2018 1206   MCV 87.6 11/07/2017 1014   MCH 25.0 (L) 11/18/2018 0910   MCHC 31.8 11/18/2018 0910   RDW 18.1 (H) 11/18/2018 0910   RDW 20.5 (H) 03/05/2018 1206   RDW 14.5 11/07/2017 1014   LYMPHSABS 1.3 11/18/2018 0910   LYMPHSABS 1.5 03/05/2018 1206   LYMPHSABS 1.7 11/07/2017 1014   MONOABS 1.6 (H) 11/18/2018 0910   MONOABS 0.8 11/07/2017 1014   EOSABS 0.1 11/18/2018 0910   EOSABS 0.4 03/05/2018 1206   BASOSABS 0.0 11/18/2018 0910   BASOSABS 0.1 03/05/2018 1206   BASOSABS 0.0 11/07/2017 1014    . CMP Latest Ref Rng & Units 11/18/2018 11/11/2018 11/03/2018  Glucose 70 - 99 mg/dL 87 61(L) 100(H)  BUN 6 - 20 mg/dL _0 Creatinine 0.61 - 1.24 mg/dL 0.87 0.89 0.89  Sodium 135 - 145 mmol/L 141 143 136  Potassium 3.5 - 5.1 mmol/L 3.5 3.5 3.0(LL)  Chloride 98 - 111 mmol/L 104 105 102  CO2 22 - 32 mmol/L _1 Calcium 8.9 - 10.3 mg/dL 8.3(L) 8.7(L) 8.2(L)  Total Protein 6.5 - 8.1 g/dL 6.2(L) 6.6 7.1  Total Bilirubin 0.3 - 1.2 mg/dL 0.5 0.5 0.4  Alkaline Phos 38 - 126 U/L 56 59 59  AST 15 - 41 U/L _2 ALT 0 - 44 U/L _3 03/18/18 Cytogenetics:  03/18/18 Pathology:  11/03/17 Cytogenetics:    RADIOGRAPHIC STUDIES: I have personally reviewed the radiological images as listed and agreed with the findings in the report. No results found.  ASSESSMENT & PLAN:   58 y.o. male with  1. Multiple Myeloma  multiple myeloma based on discovery of monoclonal gammopathy in the peripheral blood associated with a hypermetabolic lytic lesion in L4 and presence of 24% of plasma cell population in the bone marrow.   Based on the diagnosis, patient was started on treatment with bortezomib & low-dose dexamethasone.   Treatment course has been complicated by exacerbation of pulmonary hypertension and shingles reactivation x1. BM 03/18/18 shows limited response with still 18% plasma cell in the bone marrow. Cytogenetics shows  14.5% [id number]  09/01/18 MRI Lumbar revealed L4 body abnormality is more consistent with Schmorl's node than myeloma. No definite myeloma. 2. Degenerative disease causes canal and biforaminal impingement at L4-5.   2. Recent pneumonia - now resolved.  PLAN:  -Discussed pt labwork today, 11/18/18; blood counts and chemistries are stable -Last available MMP from 11/11/18 revealed M Protein at 0.7g with IgG normalized to 1346m -Will increase Revlimid to 257mas kidney are not limiting, and with hopes to achieve a deeper response and possibly transition pt off Velcade  -The pt has no prohibitive toxicities from continuing Revlimid, Dexamethasone, and Velcade at this time.   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 -Continue 8163mspirin for VTE prophylaxis with Revlimid -Continue Acyclovir prophylaxis -Continue Vitamin B complex  -Recommend that the pt follow up with PCP for Prevnar and Pneumovax every 5 years -Will see the pt back in 4 weeks    -continue weekly Velcade as ordered -increased dose of Revlimid sent to specialty pharmacy -labs weekly with treatment -RTC with dr  Kale with labs in 4 weeks   All of the patients questions were answered with apparent satisfaction. The patient knows to call the clinic with any problems, questions or concerns.  The total time spent in the appt was 25 minutes and more than 50% was on counseling and direct patient cares.    Sullivan Lone MD MS AAHIVMS [facility] Hematology/Oncology Physician [facility]  (Office):       [phone] (Work cell):  [phone] (Fax):            [phone]  11/18/2018 10:44 AM  I, Baldwin Jamaica, am acting as a scribe for Dr. Sullivan Lone.   .I have reviewed the above documentation for accuracy and completeness, and I agree with the above. Brunetta Genera MD

## 2018-11-18 ENCOUNTER — Telehealth: Payer: Self-pay

## 2018-11-18 ENCOUNTER — Inpatient Hospital Stay (HOSPITAL_BASED_OUTPATIENT_CLINIC_OR_DEPARTMENT_OTHER): Payer: Medicare Other | Admitting: Hematology

## 2018-11-18 ENCOUNTER — Inpatient Hospital Stay: Payer: Medicare Other

## 2018-11-18 VITALS — BP 112/74 | HR 65 | Temp 98.0°F | Resp 18 | Ht 61.0 in | Wt 83.9 lb

## 2018-11-18 DIAGNOSIS — Z7189 Other specified counseling: Secondary | ICD-10-CM

## 2018-11-18 DIAGNOSIS — M545 Low back pain: Secondary | ICD-10-CM

## 2018-11-18 DIAGNOSIS — Z5112 Encounter for antineoplastic immunotherapy: Secondary | ICD-10-CM | POA: Diagnosis not present

## 2018-11-18 DIAGNOSIS — Z5111 Encounter for antineoplastic chemotherapy: Secondary | ICD-10-CM

## 2018-11-18 DIAGNOSIS — C9000 Multiple myeloma not having achieved remission: Secondary | ICD-10-CM

## 2018-11-18 DIAGNOSIS — Z7982 Long term (current) use of aspirin: Secondary | ICD-10-CM

## 2018-11-18 DIAGNOSIS — G629 Polyneuropathy, unspecified: Secondary | ICD-10-CM

## 2018-11-18 LAB — CBC WITH DIFFERENTIAL/PLATELET
Abs Immature Granulocytes: 0.05 10*3/uL (ref 0.00–0.07)
BASOS ABS: 0 10*3/uL (ref 0.0–0.1)
Basophils Relative: 1 %
EOS PCT: 1 %
Eosinophils Absolute: 0.1 10*3/uL (ref 0.0–0.5)
HCT: 32.1 % — ABNORMAL LOW (ref 39.0–52.0)
Hemoglobin: 10.2 g/dL — ABNORMAL LOW (ref 13.0–17.0)
Immature Granulocytes: 1 %
LYMPHS PCT: 15 %
Lymphs Abs: 1.3 10*3/uL (ref 0.7–4.0)
MCH: 25 pg — ABNORMAL LOW (ref 26.0–34.0)
MCHC: 31.8 g/dL (ref 30.0–36.0)
MCV: 78.7 fL — ABNORMAL LOW (ref 80.0–100.0)
Monocytes Absolute: 1.6 10*3/uL — ABNORMAL HIGH (ref 0.1–1.0)
Monocytes Relative: 19 %
Neutro Abs: 5.4 10*3/uL (ref 1.7–7.7)
Neutrophils Relative %: 63 %
Platelets: 191 10*3/uL (ref 150–400)
RBC: 4.08 MIL/uL — ABNORMAL LOW (ref 4.22–5.81)
RDW: 18.1 % — ABNORMAL HIGH (ref 11.5–15.5)
WBC: 8.4 10*3/uL (ref 4.0–10.5)
nRBC: 0 % (ref 0.0–0.2)

## 2018-11-18 LAB — CMP (CANCER CENTER ONLY)
ALT: 11 U/L (ref 0–44)
AST: 19 U/L (ref 15–41)
Albumin: 3.1 g/dL — ABNORMAL LOW (ref 3.5–5.0)
Alkaline Phosphatase: 56 U/L (ref 38–126)
Anion gap: 7 (ref 5–15)
BUN: 15 mg/dL (ref 6–20)
CO2: 30 mmol/L (ref 22–32)
Calcium: 8.3 mg/dL — ABNORMAL LOW (ref 8.9–10.3)
Chloride: 104 mmol/L (ref 98–111)
Creatinine: 0.87 mg/dL (ref 0.61–1.24)
GFR, Est AFR Am: >60 mL/min (ref >60)
GFR, Estimated: >60 mL/min (ref >60)
Glucose, Bld: 87 mg/dL (ref 70–99)
Potassium: 3.5 mmol/L (ref 3.5–5.1)
Sodium: 141 mmol/L (ref 135–145)
Total Bilirubin: 0.5 mg/dL (ref 0.3–1.2)
Total Protein: 6.2 g/dL — ABNORMAL LOW (ref 6.5–8.1)

## 2018-11-18 MED ORDER — ASPIRIN EC 81 MG PO TBEC: 81.0000 mg | DELAYED_RELEASE_TABLET | Freq: Every day | ORAL | 11 refills | Status: DC

## 2018-11-18 MED ORDER — PROCHLORPERAZINE MALEATE 10 MG PO TABS
10.0000 mg | ORAL_TABLET | Freq: Once | ORAL | Status: AC
  Administered 2018-11-18: 10 mg via ORAL

## 2018-11-18 MED ORDER — SODIUM CHLORIDE 0.9 % IV SOLN
INTRAVENOUS | Status: DC
  Administered 2018-11-18: 12:00:00 via INTRAVENOUS
  Filled 2018-11-18: qty 250

## 2018-11-18 MED ORDER — LENALIDOMIDE 25 MG PO CAPS: 25.0000 mg | ORAL_CAPSULE | Freq: Every day | ORAL | 3 refills | Status: DC

## 2018-11-18 MED ORDER — PROCHLORPERAZINE MALEATE 10 MG PO TABS
ORAL_TABLET | ORAL | Status: AC
  Filled 2018-11-18: qty 1

## 2018-11-18 MED ORDER — BORTEZOMIB CHEMO SQ INJECTION 3.5 MG (2.5MG/ML)
1.3000 mg/m2 | Freq: Once | INTRAMUSCULAR | Status: AC
  Administered 2018-11-18: 1.75 mg via SUBCUTANEOUS
  Filled 2018-11-18: qty 0.7

## 2018-11-18 MED ORDER — ACYCLOVIR 400 MG PO TABS: 400.0000 mg | ORAL_TABLET | Freq: Every day | ORAL | 3 refills | Status: DC

## 2018-11-18 MED ORDER — DEXAMETHASONE 4 MG PO TABS: 20.0000 mg | ORAL_TABLET | ORAL | 3 refills | Status: AC

## 2018-11-18 MED ORDER — ZOLEDRONIC ACID 4 MG/5ML IV CONC
3.5000 mg | Freq: Once | INTRAVENOUS | Status: AC
  Administered 2018-11-18: 3.5 mg via INTRAVENOUS
  Filled 2018-11-18: qty 4.38

## 2018-11-18 MED FILL — ASPIRIN LOW DOSE 81 MG TBEC: 81 | 60 days supply | Qty: 60 | Fill #0

## 2018-11-18 MED FILL — ACYCLOVIR 400 MG TABLET: 400 | 30 days supply | Qty: 30 | Fill #0

## 2018-11-18 MED FILL — DEXAMETHASONE 4 MG TABLET: 4 | 28 days supply | Qty: 20 | Fill #0

## 2018-11-18 NOTE — Patient Instructions (Signed)
Thank you for choosing [facility] to provide your oncology and hematology care.  To afford each patient quality time with our providers, please arrive 30 minutes before your scheduled appointment time.  If you arrive late for your appointment, you may be asked to reschedule.  We strive to give you quality time with our providers, and arriving late affects you and other patients whose appointments are after yours.    If you are a no show for multiple scheduled visits, you may be dismissed from the clinic at the providers discretion.     Again, thank you for choosing  Cancer Center, our hope is that these requests will decrease the amount of time that you wait before being seen by our physicians.  ______________________________________________________________________   Should you have questions after your visit to the  Cancer Center, please contact our office at [phone] between the hours of 8:30 and 4:30 p.m.    Voicemails left after 4:30p.m will not be returned until the following business day.     For prescription refill requests, please have your pharmacy contact us directly.  Please also try to allow 48 hours for prescription requests.     Please contact the scheduling department for questions regarding scheduling.  For scheduling of procedures such as PET scans, CT scans, MRI, Ultrasound, etc please contact central scheduling at [phone].     Resources For Cancer Patients and Caregivers:    Oncolink.org:  A wonderful resource for patients and healthcare providers for information regarding your disease, ways to tract your treatment, what to expect, etc.      American Cancer Society:  800-227-2345  Can help patients locate various types of support and financial assistance   Cancer Care: 1-800-813-HOPE (4673) Provides financial assistance, online support groups, medication/co-pay assistance.     [place] DSS:  336-641-3447 Where to apply  for food stamps, Medicaid, and utility assistance   Medicare Rights Center: 800-333-4114 Helps people with Medicare understand their rights and benefits, navigate the Medicare system, and secure the quality healthcare they deserve   SCAT: 336-333-6589 [employer]'s shared-ride transportation service for eligible riders who have a disability that prevents them from riding the fixed route bus.     For additional information on assistance programs please contact our social worker:   Abigail Elmore:  [phone]  

## 2018-11-18 NOTE — Patient Instructions (Signed)
[facility] Discharge Instructions for Patients Receiving Chemotherapy  Today you received the following chemotherapy agents Bortezomib (VELCADE).  To help prevent nausea and vomiting after your treatment, we encourage you to take your nausea medication as prescribed.   If you develop nausea and vomiting that is not controlled by your nausea medication, call the clinic.   BELOW ARE SYMPTOMS THAT SHOULD BE REPORTED IMMEDIATELY:  *FEVER GREATER THAN 100.5 F  *CHILLS WITH OR WITHOUT FEVER  NAUSEA AND VOMITING THAT IS NOT CONTROLLED WITH YOUR NAUSEA MEDICATION  *UNUSUAL SHORTNESS OF BREATH  *UNUSUAL BRUISING OR BLEEDING  TENDERNESS IN MOUTH AND THROAT WITH OR WITHOUT PRESENCE OF ULCERS  *URINARY PROBLEMS  *BOWEL PROBLEMS  UNUSUAL RASH Items with * indicate a potential emergency and should be followed up as soon as possible.  Feel free to call the clinic should you have any questions or concerns. The clinic phone number is [phone].  Please show the CHEMO ALERT CARD at check-in to the Emergency Department and triage nurse.   

## 2018-11-18 NOTE — Telephone Encounter (Signed)
Printed avs and calender of upcoming appointment. Per 11/5 los 

## 2018-11-18 NOTE — Telephone Encounter (Signed)
Oral Oncology Patient Advocate Encounter  Revlimid has been approved to be filled through the [employer] manufacturer assistance program until 11/04/19.  I called the patient to give him the good news and had to leave a voicemail.  [facility] [place] Phone [phone] Fax [phone]

## 2018-11-25 ENCOUNTER — Inpatient Hospital Stay: Payer: Medicare Other

## 2018-11-25 VITALS — BP 117/64 | HR 74 | Temp 98.4°F | Resp 17 | Wt 87.5 lb

## 2018-11-25 DIAGNOSIS — Z5111 Encounter for antineoplastic chemotherapy: Secondary | ICD-10-CM

## 2018-11-25 DIAGNOSIS — C9000 Multiple myeloma not having achieved remission: Secondary | ICD-10-CM

## 2018-11-25 DIAGNOSIS — Z5112 Encounter for antineoplastic immunotherapy: Secondary | ICD-10-CM | POA: Diagnosis not present

## 2018-11-25 LAB — CBC WITH DIFFERENTIAL/PLATELET
Abs Immature Granulocytes: 0.03 10*3/uL (ref 0.00–0.07)
BASOS ABS: 0.1 10*3/uL (ref 0.0–0.1)
Basophils Relative: 1 %
EOS ABS: 0.1 10*3/uL (ref 0.0–0.5)
Eosinophils Relative: 1 %
HCT: 34.7 % — ABNORMAL LOW (ref 39.0–52.0)
Hemoglobin: 10.9 g/dL — ABNORMAL LOW (ref 13.0–17.0)
Immature Granulocytes: 0 %
Lymphocytes Relative: 20 %
Lymphs Abs: 1.6 10*3/uL (ref 0.7–4.0)
MCH: 25.2 pg — ABNORMAL LOW (ref 26.0–34.0)
MCHC: 31.4 g/dL (ref 30.0–36.0)
MCV: 80.3 fL (ref 80.0–100.0)
Monocytes Absolute: 1.8 10*3/uL — ABNORMAL HIGH (ref 0.1–1.0)
Monocytes Relative: 22 %
NRBC: 0 % (ref 0.0–0.2)
Neutro Abs: 4.5 10*3/uL (ref 1.7–7.7)
Neutrophils Relative %: 56 %
Platelets: 261 10*3/uL (ref 150–400)
RBC: 4.32 MIL/uL (ref 4.22–5.81)
RDW: 17.6 % — AB (ref 11.5–15.5)
WBC: 8.1 10*3/uL (ref 4.0–10.5)

## 2018-11-25 LAB — CMP (CANCER CENTER ONLY)
ALT: 10 U/L (ref 0–44)
ANION GAP: 10 (ref 5–15)
AST: 18 U/L (ref 15–41)
Albumin: 3.1 g/dL — ABNORMAL LOW (ref 3.5–5.0)
Alkaline Phosphatase: 63 U/L (ref 38–126)
BUN: 14 mg/dL (ref 6–20)
CO2: 27 mmol/L (ref 22–32)
Calcium: 8.8 mg/dL — ABNORMAL LOW (ref 8.9–10.3)
Chloride: 105 mmol/L (ref 98–111)
Creatinine: 0.83 mg/dL (ref 0.61–1.24)
GFR, Est AFR Am: >60 mL/min (ref >60)
GFR, Estimated: >60 mL/min (ref >60)
Glucose, Bld: 77 mg/dL (ref 70–99)
Potassium: 3.6 mmol/L (ref 3.5–5.1)
Sodium: 142 mmol/L (ref 135–145)
TOTAL PROTEIN: 6.8 g/dL (ref 6.5–8.1)
Total Bilirubin: <0.2 mg/dL — ABNORMAL LOW (ref 0.3–1.2)

## 2018-11-25 MED ORDER — PROCHLORPERAZINE MALEATE 10 MG PO TABS
10.0000 mg | ORAL_TABLET | Freq: Once | ORAL | Status: AC
  Administered 2018-11-25: 10 mg via ORAL

## 2018-11-25 MED ORDER — PROCHLORPERAZINE MALEATE 10 MG PO TABS
ORAL_TABLET | ORAL | Status: AC
  Filled 2018-11-25: qty 1

## 2018-11-25 MED ORDER — BORTEZOMIB CHEMO SQ INJECTION 3.5 MG (2.5MG/ML)
1.3000 mg/m2 | Freq: Once | INTRAMUSCULAR | Status: AC
  Administered 2018-11-25: 1.75 mg via SUBCUTANEOUS
  Filled 2018-11-25: qty 0.7

## 2018-11-25 NOTE — Patient Instructions (Signed)
[facility] Discharge Instructions for Patients Receiving Chemotherapy  Today you received the following chemotherapy agents Bortezomib (VELCADE).  To help prevent nausea and vomiting after your treatment, we encourage you to take your nausea medication as prescribed.   If you develop nausea and vomiting that is not controlled by your nausea medication, call the clinic.   BELOW ARE SYMPTOMS THAT SHOULD BE REPORTED IMMEDIATELY:  *FEVER GREATER THAN 100.5 F  *CHILLS WITH OR WITHOUT FEVER  NAUSEA AND VOMITING THAT IS NOT CONTROLLED WITH YOUR NAUSEA MEDICATION  *UNUSUAL SHORTNESS OF BREATH  *UNUSUAL BRUISING OR BLEEDING  TENDERNESS IN MOUTH AND THROAT WITH OR WITHOUT PRESENCE OF ULCERS  *URINARY PROBLEMS  *BOWEL PROBLEMS  UNUSUAL RASH Items with * indicate a potential emergency and should be followed up as soon as possible.  Feel free to call the clinic should you have any questions or concerns. The clinic phone number is [phone].  Please show the CHEMO ALERT CARD at check-in to the Emergency Department and triage nurse.   

## 2018-12-02 ENCOUNTER — Inpatient Hospital Stay: Payer: Medicare Other

## 2018-12-02 ENCOUNTER — Inpatient Hospital Stay: Payer: Medicare Other | Admitting: *Deleted

## 2018-12-02 VITALS — BP 100/62 | HR 63 | Temp 98.4°F | Resp 16

## 2018-12-02 DIAGNOSIS — Z5112 Encounter for antineoplastic immunotherapy: Secondary | ICD-10-CM | POA: Diagnosis not present

## 2018-12-02 DIAGNOSIS — C9000 Multiple myeloma not having achieved remission: Secondary | ICD-10-CM

## 2018-12-02 LAB — CMP (CANCER CENTER ONLY)
ALT: 12 U/L (ref 0–44)
AST: 18 U/L (ref 15–41)
Albumin: 3.4 g/dL — ABNORMAL LOW (ref 3.5–5.0)
Alkaline Phosphatase: 70 U/L (ref 38–126)
Anion gap: 9 (ref 5–15)
BUN: 14 mg/dL (ref 6–20)
CALCIUM: 8.8 mg/dL — AB (ref 8.9–10.3)
CO2: 30 mmol/L (ref 22–32)
Chloride: 102 mmol/L (ref 98–111)
Creatinine: 0.96 mg/dL (ref 0.61–1.24)
GFR, Est AFR Am: >60 mL/min (ref >60)
GFR, Estimated: >60 mL/min (ref >60)
Glucose, Bld: 90 mg/dL (ref 70–99)
Potassium: 3.8 mmol/L (ref 3.5–5.1)
Sodium: 141 mmol/L (ref 135–145)
Total Bilirubin: 0.3 mg/dL (ref 0.3–1.2)
Total Protein: 7.4 g/dL (ref 6.5–8.1)

## 2018-12-02 LAB — CBC WITH DIFFERENTIAL/PLATELET
Abs Immature Granulocytes: 0.01 10*3/uL (ref 0.00–0.07)
Basophils Absolute: 0.1 10*3/uL (ref 0.0–0.1)
Basophils Relative: 2 %
Eosinophils Absolute: 0.2 10*3/uL (ref 0.0–0.5)
Eosinophils Relative: 3 %
HCT: 36.7 % — ABNORMAL LOW (ref 39.0–52.0)
Hemoglobin: 11.5 g/dL — ABNORMAL LOW (ref 13.0–17.0)
Immature Granulocytes: 0 %
Lymphocytes Relative: 17 %
Lymphs Abs: 1 10*3/uL (ref 0.7–4.0)
MCH: 24.9 pg — ABNORMAL LOW (ref 26.0–34.0)
MCHC: 31.3 g/dL (ref 30.0–36.0)
MCV: 79.4 fL — ABNORMAL LOW (ref 80.0–100.0)
Monocytes Absolute: 0.4 10*3/uL (ref 0.1–1.0)
Monocytes Relative: 7 %
NEUTROS PCT: 71 %
Neutro Abs: 4.1 10*3/uL (ref 1.7–7.7)
Platelets: 326 10*3/uL (ref 150–400)
RBC: 4.62 MIL/uL (ref 4.22–5.81)
RDW: 17.1 % — ABNORMAL HIGH (ref 11.5–15.5)
WBC: 5.7 10*3/uL (ref 4.0–10.5)
nRBC: 0 % (ref 0.0–0.2)

## 2018-12-02 MED ORDER — PROCHLORPERAZINE MALEATE 10 MG PO TABS
ORAL_TABLET | ORAL | Status: AC
  Filled 2018-12-02: qty 1

## 2018-12-02 MED ORDER — BORTEZOMIB CHEMO SQ INJECTION 3.5 MG (2.5MG/ML)
1.3000 mg/m2 | Freq: Once | INTRAMUSCULAR | Status: AC
  Administered 2018-12-02: 1.75 mg via SUBCUTANEOUS
  Filled 2018-12-02: qty 0.7

## 2018-12-02 MED ORDER — PROCHLORPERAZINE MALEATE 10 MG PO TABS
10.0000 mg | ORAL_TABLET | Freq: Once | ORAL | Status: AC
  Administered 2018-12-02: 10 mg via ORAL

## 2018-12-02 NOTE — Patient Instructions (Signed)
[facility] Discharge Instructions for Patients Receiving Chemotherapy  Today you received the following chemotherapy agents Bortezomib (VELCADE).  To help prevent nausea and vomiting after your treatment, we encourage you to take your nausea medication as prescribed.   If you develop nausea and vomiting that is not controlled by your nausea medication, call the clinic.   BELOW ARE SYMPTOMS THAT SHOULD BE REPORTED IMMEDIATELY:  *FEVER GREATER THAN 100.5 F  *CHILLS WITH OR WITHOUT FEVER  NAUSEA AND VOMITING THAT IS NOT CONTROLLED WITH YOUR NAUSEA MEDICATION  *UNUSUAL SHORTNESS OF BREATH  *UNUSUAL BRUISING OR BLEEDING  TENDERNESS IN MOUTH AND THROAT WITH OR WITHOUT PRESENCE OF ULCERS  *URINARY PROBLEMS  *BOWEL PROBLEMS  UNUSUAL RASH Items with * indicate a potential emergency and should be followed up as soon as possible.  Feel free to call the clinic should you have any questions or concerns. The clinic phone number is [phone].  Please show the CHEMO ALERT CARD at check-in to the Emergency Department and triage nurse.   

## 2018-12-04 LAB — MULTIPLE MYELOMA PANEL, SERUM
Albumin SerPl Elph-Mcnc: 3.4 g/dL (ref 2.9–4.4)
Albumin/Glob SerPl: 1.2 (ref 0.7–1.7)
Alpha 1: 0.3 g/dL (ref 0.0–0.4)
Alpha2 Glob SerPl Elph-Mcnc: 0.8 g/dL (ref 0.4–1.0)
B-Globulin SerPl Elph-Mcnc: 0.8 g/dL (ref 0.7–1.3)
Gamma Glob SerPl Elph-Mcnc: 1 g/dL (ref 0.4–1.8)
Globulin, Total: 2.9 g/dL (ref 2.2–3.9)
IGM (IMMUNOGLOBULIN M), SRM: 35 mg/dL (ref 20–172)
IgA: 110 mg/dL (ref 90–386)
IgG (Immunoglobin G), Serum: 1325 mg/dL (ref 700–1600)
M Protein SerPl Elph-Mcnc: 0.6 g/dL — ABNORMAL HIGH
TOTAL PROTEIN ELP: 6.3 g/dL (ref 6.0–8.5)

## 2018-12-06 ENCOUNTER — Other Ambulatory Visit: Payer: Self-pay

## 2018-12-06 ENCOUNTER — Inpatient Hospital Stay (HOSPITAL_COMMUNITY)
Admission: EM | Admit: 2018-12-06 | Discharge: 2018-12-19 | DRG: 870 | Disposition: A | Discharge status: Home Health | Payer: Medicare Other | Attending: Internal Medicine | Admitting: Internal Medicine

## 2018-12-06 ENCOUNTER — Encounter (HOSPITAL_COMMUNITY): Payer: Self-pay | Admitting: Emergency Medicine

## 2018-12-06 ENCOUNTER — Observation Stay (HOSPITAL_COMMUNITY): Payer: Medicare Other

## 2018-12-06 ENCOUNTER — Emergency Department (HOSPITAL_COMMUNITY): Payer: Medicare Other

## 2018-12-06 DIAGNOSIS — J479 Bronchiectasis, uncomplicated: Secondary | ICD-10-CM

## 2018-12-06 DIAGNOSIS — Z79891 Long term (current) use of opiate analgesic: Secondary | ICD-10-CM

## 2018-12-06 DIAGNOSIS — I371 Nonrheumatic pulmonary valve insufficiency: Secondary | ICD-10-CM | POA: Diagnosis present

## 2018-12-06 DIAGNOSIS — J449 Chronic obstructive pulmonary disease, unspecified: Secondary | ICD-10-CM | POA: Diagnosis present

## 2018-12-06 DIAGNOSIS — E870 Hyperosmolality and hypernatremia: Secondary | ICD-10-CM | POA: Diagnosis not present

## 2018-12-06 DIAGNOSIS — I480 Paroxysmal atrial fibrillation: Secondary | ICD-10-CM | POA: Diagnosis not present

## 2018-12-06 DIAGNOSIS — Z8249 Family history of ischemic heart disease and other diseases of the circulatory system: Secondary | ICD-10-CM

## 2018-12-06 DIAGNOSIS — I10 Essential (primary) hypertension: Secondary | ICD-10-CM | POA: Diagnosis present

## 2018-12-06 DIAGNOSIS — Z452 Encounter for adjustment and management of vascular access device: Secondary | ICD-10-CM

## 2018-12-06 DIAGNOSIS — E860 Dehydration: Secondary | ICD-10-CM | POA: Diagnosis present

## 2018-12-06 DIAGNOSIS — A4101 Sepsis due to Methicillin susceptible Staphylococcus aureus: Principal | ICD-10-CM | POA: Diagnosis present

## 2018-12-06 DIAGNOSIS — E875 Hyperkalemia: Secondary | ICD-10-CM | POA: Diagnosis not present

## 2018-12-06 DIAGNOSIS — Z87442 Personal history of urinary calculi: Secondary | ICD-10-CM

## 2018-12-06 DIAGNOSIS — A419 Sepsis, unspecified organism: Secondary | ICD-10-CM | POA: Diagnosis not present

## 2018-12-06 DIAGNOSIS — Z79899 Other long term (current) drug therapy: Secondary | ICD-10-CM

## 2018-12-06 DIAGNOSIS — Z681 Body mass index (BMI) 19 or less, adult: Secondary | ICD-10-CM

## 2018-12-06 DIAGNOSIS — J440 Chronic obstructive pulmonary disease with acute lower respiratory infection: Secondary | ICD-10-CM | POA: Diagnosis present

## 2018-12-06 DIAGNOSIS — R6521 Severe sepsis with septic shock: Secondary | ICD-10-CM | POA: Diagnosis not present

## 2018-12-06 DIAGNOSIS — E876 Hypokalemia: Secondary | ICD-10-CM | POA: Diagnosis present

## 2018-12-06 DIAGNOSIS — J189 Pneumonia, unspecified organism: Secondary | ICD-10-CM | POA: Diagnosis present

## 2018-12-06 DIAGNOSIS — B9561 Methicillin susceptible Staphylococcus aureus infection as the cause of diseases classified elsewhere: Secondary | ICD-10-CM | POA: Diagnosis present

## 2018-12-06 DIAGNOSIS — Z7982 Long term (current) use of aspirin: Secondary | ICD-10-CM

## 2018-12-06 DIAGNOSIS — J918 Pleural effusion in other conditions classified elsewhere: Secondary | ICD-10-CM | POA: Diagnosis present

## 2018-12-06 DIAGNOSIS — E872 Acidosis: Secondary | ICD-10-CM | POA: Diagnosis present

## 2018-12-06 DIAGNOSIS — E8801 Alpha-1-antitrypsin deficiency: Secondary | ICD-10-CM | POA: Diagnosis present

## 2018-12-06 DIAGNOSIS — Z9981 Dependence on supplemental oxygen: Secondary | ICD-10-CM

## 2018-12-06 DIAGNOSIS — I4891 Unspecified atrial fibrillation: Secondary | ICD-10-CM | POA: Diagnosis present

## 2018-12-06 DIAGNOSIS — R0603 Acute respiratory distress: Secondary | ICD-10-CM

## 2018-12-06 DIAGNOSIS — R64 Cachexia: Secondary | ICD-10-CM | POA: Diagnosis present

## 2018-12-06 DIAGNOSIS — E43 Unspecified severe protein-calorie malnutrition: Secondary | ICD-10-CM | POA: Diagnosis present

## 2018-12-06 DIAGNOSIS — Z9289 Personal history of other medical treatment: Secondary | ICD-10-CM

## 2018-12-06 DIAGNOSIS — E871 Hypo-osmolality and hyponatremia: Secondary | ICD-10-CM | POA: Diagnosis present

## 2018-12-06 DIAGNOSIS — J9621 Acute and chronic respiratory failure with hypoxia: Secondary | ICD-10-CM | POA: Diagnosis present

## 2018-12-06 DIAGNOSIS — E878 Other disorders of electrolyte and fluid balance, not elsewhere classified: Secondary | ICD-10-CM | POA: Diagnosis present

## 2018-12-06 DIAGNOSIS — J948 Other specified pleural conditions: Secondary | ICD-10-CM | POA: Diagnosis present

## 2018-12-06 DIAGNOSIS — R109 Unspecified abdominal pain: Secondary | ICD-10-CM

## 2018-12-06 DIAGNOSIS — R7881 Bacteremia: Secondary | ICD-10-CM

## 2018-12-06 DIAGNOSIS — D509 Iron deficiency anemia, unspecified: Secondary | ICD-10-CM | POA: Diagnosis present

## 2018-12-06 DIAGNOSIS — T451X5A Adverse effect of antineoplastic and immunosuppressive drugs, initial encounter: Secondary | ICD-10-CM | POA: Diagnosis present

## 2018-12-06 DIAGNOSIS — C9000 Multiple myeloma not having achieved remission: Secondary | ICD-10-CM

## 2018-12-06 DIAGNOSIS — Z978 Presence of other specified devices: Secondary | ICD-10-CM

## 2018-12-06 DIAGNOSIS — J181 Lobar pneumonia, unspecified organism: Secondary | ICD-10-CM

## 2018-12-06 DIAGNOSIS — I519 Heart disease, unspecified: Secondary | ICD-10-CM | POA: Diagnosis present

## 2018-12-06 DIAGNOSIS — D899 Disorder involving the immune mechanism, unspecified: Secondary | ICD-10-CM | POA: Diagnosis present

## 2018-12-06 DIAGNOSIS — I5189 Other ill-defined heart diseases: Secondary | ICD-10-CM | POA: Diagnosis present

## 2018-12-06 DIAGNOSIS — J920 Pleural plaque with presence of asbestos: Secondary | ICD-10-CM | POA: Diagnosis present

## 2018-12-06 DIAGNOSIS — J90 Pleural effusion, not elsewhere classified: Secondary | ICD-10-CM | POA: Diagnosis present

## 2018-12-06 DIAGNOSIS — B4481 Allergic bronchopulmonary aspergillosis: Secondary | ICD-10-CM | POA: Diagnosis present

## 2018-12-06 DIAGNOSIS — Z7951 Long term (current) use of inhaled steroids: Secondary | ICD-10-CM

## 2018-12-06 DIAGNOSIS — R6520 Severe sepsis without septic shock: Secondary | ICD-10-CM | POA: Diagnosis present

## 2018-12-06 DIAGNOSIS — I2729 Other secondary pulmonary hypertension: Secondary | ICD-10-CM | POA: Diagnosis present

## 2018-12-06 DIAGNOSIS — Y95 Nosocomial condition: Secondary | ICD-10-CM | POA: Diagnosis present

## 2018-12-06 HISTORY — DX: Multiple myeloma not having achieved remission: C90.00

## 2018-12-06 HISTORY — DX: Chronic obstructive pulmonary disease, unspecified: J44.9

## 2018-12-06 LAB — URINALYSIS, ROUTINE W REFLEX MICROSCOPIC
Bacteria, UA: NONE SEEN
Bilirubin Urine: NEGATIVE
Bilirubin Urine: NEGATIVE
GLUCOSE, UA: 50 mg/dL — AB
Glucose, UA: NEGATIVE mg/dL
Hgb urine dipstick: NEGATIVE
Ketones, ur: NEGATIVE mg/dL
Ketones, ur: 5 mg/dL — AB
Leukocytes, UA: NEGATIVE
Leukocytes, UA: NEGATIVE
Nitrite: NEGATIVE
Nitrite: NEGATIVE
Protein, ur: 30 mg/dL — AB
Protein, ur: NEGATIVE mg/dL
Specific Gravity, Urine: 1.014 (ref 1.005–1.030)
Specific Gravity, Urine: 1.011 (ref 1.005–1.030)
pH: 6 (ref 5.0–8.0)
pH: 7 (ref 5.0–8.0)

## 2018-12-06 LAB — CBC
HCT: 39 % (ref 39.0–52.0)
Hemoglobin: 12.6 g/dL — ABNORMAL LOW (ref 13.0–17.0)
MCH: 24.9 pg — ABNORMAL LOW (ref 26.0–34.0)
MCHC: 32.3 g/dL (ref 30.0–36.0)
MCV: 77.1 fL — ABNORMAL LOW (ref 80.0–100.0)
Platelets: 142 10*3/uL — ABNORMAL LOW (ref 150–400)
RBC: 5.06 MIL/uL (ref 4.22–5.81)
RDW: 16 % — ABNORMAL HIGH (ref 11.5–15.5)
WBC: 16.1 10*3/uL — ABNORMAL HIGH (ref 4.0–10.5)
nRBC: 0 % (ref 0.0–0.2)

## 2018-12-06 LAB — BASIC METABOLIC PANEL
ANION GAP: 13 (ref 5–15)
BUN: 13 mg/dL (ref 6–20)
CALCIUM: 7.5 mg/dL — AB (ref 8.9–10.3)
CO2: 23 mmol/L (ref 22–32)
Chloride: 94 mmol/L — ABNORMAL LOW (ref 98–111)
Creatinine, Ser: 1.06 mg/dL (ref 0.61–1.24)
GFR calc Af Amer: >60 mL/min (ref >60)
GFR calc non Af Amer: >60 mL/min (ref >60)
GLUCOSE: 162 mg/dL — AB (ref 70–99)
Potassium: 3.4 mmol/L — ABNORMAL LOW (ref 3.5–5.1)
Sodium: 130 mmol/L — ABNORMAL LOW (ref 135–145)

## 2018-12-06 LAB — INFLUENZA PANEL BY PCR (TYPE A & B)
INFLAPCR: NEGATIVE
INFLBPCR: NEGATIVE

## 2018-12-06 LAB — I-STAT TROPONIN, ED: Troponin i, poc: 0 ng/mL (ref 0.00–0.08)

## 2018-12-06 LAB — LACTIC ACID, PLASMA
LACTIC ACID, VENOUS: 3.2 mmol/L — AB (ref 0.5–1.9)
Lactic Acid, Venous: 4 mmol/L (ref 0.5–1.9)

## 2018-12-06 LAB — MRSA PCR SCREENING: MRSA by PCR: NEGATIVE

## 2018-12-06 MED ORDER — AMIODARONE HCL IN DEXTROSE 360-4.14 MG/200ML-% IV SOLN
30.0000 mg/h | INTRAVENOUS | Status: DC
  Administered 2018-12-07: 30 mg/h via INTRAVENOUS
  Filled 2018-12-06 (×3): qty 200

## 2018-12-06 MED ORDER — ACYCLOVIR 400 MG PO TABS
400.0000 mg | ORAL_TABLET | Freq: Every day | ORAL | Status: DC
  Administered 2018-12-06 – 2018-12-10 (×4): 400 mg via ORAL
  Filled 2018-12-06 (×6): qty 1

## 2018-12-06 MED ORDER — SENNOSIDES-DOCUSATE SODIUM 8.6-50 MG PO TABS: 1.0000 | ORAL_TABLET | Freq: Every evening | ORAL | Status: DC | PRN

## 2018-12-06 MED ORDER — SODIUM CHLORIDE 0.9 % IV BOLUS: 500.0000 mL | Freq: Once | INTRAVENOUS | Status: DC

## 2018-12-06 MED ORDER — SODIUM CHLORIDE (PF) 0.9 % IJ SOLN
INTRAMUSCULAR | Status: AC
  Administered 2018-12-06: 16:00:00
  Filled 2018-12-06: qty 50

## 2018-12-06 MED ORDER — VANCOMYCIN HCL IN DEXTROSE 750-5 MG/150ML-% IV SOLN: 750.0000 mg | INTRAVENOUS | Status: DC

## 2018-12-06 MED ORDER — ONDANSETRON HCL 4 MG/2ML IJ SOLN: 4.0000 mg | Freq: Four times a day (QID) | INTRAMUSCULAR | Status: DC | PRN

## 2018-12-06 MED ORDER — OXYCODONE-ACETAMINOPHEN 5-325 MG PO TABS
1.0000 | ORAL_TABLET | Freq: Four times a day (QID) | ORAL | Status: DC | PRN
  Administered 2018-12-08 (×2): 1 via ORAL
  Filled 2018-12-06 (×2): qty 1

## 2018-12-06 MED ORDER — SODIUM CHLORIDE 0.9 % IV BOLUS
1000.0000 mL | Freq: Once | INTRAVENOUS | Status: AC
  Administered 2018-12-06: 1000 mL via INTRAVENOUS

## 2018-12-06 MED ORDER — AMIODARONE LOAD VIA INFUSION
150.0000 mg | Freq: Once | INTRAVENOUS | Status: AC
  Administered 2018-12-07: 150 mg via INTRAVENOUS
  Filled 2018-12-06: qty 83.34

## 2018-12-06 MED ORDER — PANTOPRAZOLE SODIUM 40 MG PO TBEC
40.0000 mg | DELAYED_RELEASE_TABLET | Freq: Every day | ORAL | Status: DC
  Administered 2018-12-07 – 2018-12-08 (×2): 40 mg via ORAL
  Filled 2018-12-06 (×2): qty 1

## 2018-12-06 MED ORDER — AMIODARONE HCL IN DEXTROSE 360-4.14 MG/200ML-% IV SOLN
60.0000 mg/h | INTRAVENOUS | Status: AC
  Administered 2018-12-07 (×2): 60 mg/h via INTRAVENOUS

## 2018-12-06 MED ORDER — SODIUM CHLORIDE 0.9 % IV BOLUS
500.0000 mL | Freq: Once | INTRAVENOUS | Status: AC
  Administered 2018-12-06: 500 mL via INTRAVENOUS

## 2018-12-06 MED ORDER — ENOXAPARIN SODIUM 30 MG/0.3ML ~~LOC~~ SOLN
30.0000 mg | SUBCUTANEOUS | Status: DC
  Administered 2018-12-06 – 2018-12-18 (×13): 30 mg via SUBCUTANEOUS
  Filled 2018-12-06 (×13): qty 0.3

## 2018-12-06 MED ORDER — SODIUM CHLORIDE 0.9% FLUSH
3.0000 mL | Freq: Once | INTRAVENOUS | Status: AC
  Administered 2018-12-06: 3 mL via INTRAVENOUS

## 2018-12-06 MED ORDER — FLUTICASONE-UMECLIDIN-VILANT 100-62.5-25 MCG/INH IN AEPB: 1.0000 | INHALATION_SPRAY | Freq: Every day | RESPIRATORY_TRACT | Status: DC

## 2018-12-06 MED ORDER — ENSURE ENLIVE PO LIQD
237.0000 mL | Freq: Two times a day (BID) | ORAL | Status: DC
  Administered 2018-12-07 – 2018-12-08 (×4): 237 mL via ORAL

## 2018-12-06 MED ORDER — VANCOMYCIN HCL IN DEXTROSE 750-5 MG/150ML-% IV SOLN
750.0000 mg | INTRAVENOUS | Status: AC
  Administered 2018-12-06: 750 mg via INTRAVENOUS
  Filled 2018-12-06: qty 150

## 2018-12-06 MED ORDER — FLUTICASONE FUROATE-VILANTEROL 100-25 MCG/INH IN AEPB
1.0000 | INHALATION_SPRAY | Freq: Every day | RESPIRATORY_TRACT | Status: DC
  Administered 2018-12-07 – 2018-12-08 (×2): 1 via RESPIRATORY_TRACT
  Filled 2018-12-06: qty 28

## 2018-12-06 MED ORDER — DOCUSATE SODIUM 50 MG PO CAPS
50.0000 mg | ORAL_CAPSULE | Freq: Every day | ORAL | Status: DC
  Administered 2018-12-06 – 2018-12-08 (×2): 50 mg via ORAL
  Filled 2018-12-06 (×3): qty 1

## 2018-12-06 MED ORDER — SODIUM CHLORIDE 0.9 % IV SOLN
1.0000 g | Freq: Once | INTRAVENOUS | Status: AC
  Administered 2018-12-06: 1 g via INTRAVENOUS
  Filled 2018-12-06 (×2): qty 1

## 2018-12-06 MED ORDER — ACETAMINOPHEN 650 MG RE SUPP: 650.0000 mg | Freq: Four times a day (QID) | RECTAL | Status: DC | PRN

## 2018-12-06 MED ORDER — METHYLPREDNISOLONE SODIUM SUCC 40 MG IJ SOLR
40.0000 mg | Freq: Two times a day (BID) | INTRAMUSCULAR | Status: DC
  Administered 2018-12-06 – 2018-12-08 (×4): 40 mg via INTRAVENOUS
  Filled 2018-12-06 (×4): qty 1

## 2018-12-06 MED ORDER — UMECLIDINIUM BROMIDE 62.5 MCG/INH IN AEPB
1.0000 | INHALATION_SPRAY | Freq: Every day | RESPIRATORY_TRACT | Status: DC
  Administered 2018-12-07 – 2018-12-08 (×2): 1 via RESPIRATORY_TRACT
  Filled 2018-12-06: qty 7

## 2018-12-06 MED ORDER — ONDANSETRON HCL 4 MG PO TABS: 4.0000 mg | ORAL_TABLET | Freq: Four times a day (QID) | ORAL | Status: DC | PRN

## 2018-12-06 MED ORDER — SODIUM CHLORIDE 0.9 % IV BOLUS
1500.0000 mL | Freq: Once | INTRAVENOUS | Status: AC
  Administered 2018-12-06: 1500 mL via INTRAVENOUS

## 2018-12-06 MED ORDER — ACETAMINOPHEN 500 MG PO TABS
1000.0000 mg | ORAL_TABLET | Freq: Once | ORAL | Status: AC
  Administered 2018-12-06: 1000 mg via ORAL
  Filled 2018-12-06: qty 2

## 2018-12-06 MED ORDER — SODIUM CHLORIDE 0.9 % IV SOLN
INTRAVENOUS | Status: DC
  Administered 2018-12-06 – 2018-12-08 (×3): via INTRAVENOUS

## 2018-12-06 MED ORDER — ASPIRIN EC 81 MG PO TBEC
81.0000 mg | DELAYED_RELEASE_TABLET | Freq: Every day | ORAL | Status: DC
  Administered 2018-12-07 – 2018-12-08 (×2): 81 mg via ORAL
  Filled 2018-12-06 (×2): qty 1

## 2018-12-06 MED ORDER — POTASSIUM CHLORIDE CRYS ER 20 MEQ PO TBCR
40.0000 meq | EXTENDED_RELEASE_TABLET | Freq: Once | ORAL | Status: AC
  Administered 2018-12-06: 40 meq via ORAL
  Filled 2018-12-06: qty 2

## 2018-12-06 MED ORDER — ACETAMINOPHEN 325 MG PO TABS: 650.0000 mg | ORAL_TABLET | Freq: Four times a day (QID) | ORAL | Status: DC | PRN

## 2018-12-06 MED ORDER — SODIUM CHLORIDE 0.9 % IV SOLN
1.0000 g | Freq: Two times a day (BID) | INTRAVENOUS | Status: DC
  Administered 2018-12-07 (×2): 1 g via INTRAVENOUS
  Filled 2018-12-06 (×2): qty 1

## 2018-12-06 MED ORDER — IPRATROPIUM-ALBUTEROL 0.5-2.5 (3) MG/3ML IN SOLN
3.0000 mL | Freq: Four times a day (QID) | RESPIRATORY_TRACT | Status: DC
  Administered 2018-12-06 (×2): 3 mL via RESPIRATORY_TRACT
  Filled 2018-12-06 (×2): qty 3

## 2018-12-06 NOTE — Progress Notes (Signed)
Pharmacy Antibiotic Note  [name] [name] is a 58 y.o. male admitted on 12/06/2018 with sepsis with suspected pneumonia.  PMH significant for multiple myeloma, pulmonary HTN, COPD.  Presents with c/o fever, worsening cough and hemoptysis.  Pharmacy has been consulted for Vancomycin and Cefepime dosing.  Plan:  Vancomycin 750 mg IV Q 36 hrs. Goal AUC 400-550.  Expected AUC: 430.1  SCr used: 1.06  Cefepime 1gm IV q12h  F/U culture results and sensitivities  Check vancomycin levels as appropriate    Temp (24hrs), Avg:99 F (37.2 C), Min:97.5 F (36.4 C), Max:100.4 F (38 C)  Recent Labs  Lab 12/02/18 1418 12/06/18 1041 12/06/18 1153  WBC 5.7 16.1*  --   CREATININE 0.96 1.06  --   LATICACIDVEN  --   --  4.0*    Estimated Creatinine Clearance: 43.7 mL/min (by C-G formula based on SCr of 1.06 mg/dL).    No Known Allergies  Antimicrobials this admission: 2/2 Vanc >>   2/2 Cefepime >>    Dose adjustments this admission:    Microbiology results: 2/2 BCx: sent 2/2 Sputum: sent   Thank you for allowing pharmacy to be a part of this patient's care.  Everette Rank, PharmD 12/06/2018 2:24 PM

## 2018-12-06 NOTE — ED Provider Notes (Signed)
  Physical Exam  BP 109/69   Pulse 93   Temp (!) 100.4 F (38 C) (Rectal)   Resp (!) 33   SpO2 94%   Physical Exam   Gen: appears chronically ill and dehydrated. Cachectic Pulm: tachypneic, rales in RLL.   ED Course/Procedures   Clinical Course as of Dec 06 1204  Sun Dec 06, 2018  1130 Rectal temperature of 104 F, patient now also with tachycardia, meet SIRS criteria, sepsis   [BM]    Clinical Course User Index [BM] Deliah Boston, PA-C    Procedures  MDM   Pt signed out to me by B morelli, PA-C. Please see previous notes for further history.   In brief, patient presented for evaluation of shortness of breath and cough for the past several days.  Upon arrival, patient febrile to 100.4, tachycardic at 110, and tachypneic at 40.  Patient with a history of multiple myeloma, last treatment was 5 days ago.  Additional history of COPD for which he wears oxygen at home at 3 L.  He does not wear oxygen during the day.  Due to patient's vitals and concern for pneumonia, code sepsis called.  Patient started on cefepime and Vanc.  Labs with elevated white count at 16.  Lactic elevated at 4.  Chest x-ray viewed and interpreted by me, shows chronic bilateral pleural effusion, possible superimposed PNA. Considering lactic, leukocytosis, vital signs, and immunocompromised status, will call for admission.   Discussed with Dr. Tyrell Antonio from [facility], pt to be admitted.   Today's Vitals   12/06/18 1118 12/06/18 1124 12/06/18 1153 12/06/18 1233  BP: 109/69  109/69 102/66  Pulse: 96  93 91  Resp: (!) 38  (!) 33 (!) 34  Temp:  (!) 100.4 F (38 C)    TempSrc:  Rectal    SpO2: 94%  94% 94%  PainSc:       There is no height or weight on file to calculate BMI.    Franchot Heidelberg, PA-C 12/06/18 1256    Daleen Bo, MD 12/06/18 1610

## 2018-12-06 NOTE — H&P (Signed)
History and Physical  [name] [name] SMO:[id number] DOB: 11-May-1961 DOA: 12/06/2018  PCP: [name], PA-C Patient coming from: Home  I have personally briefly reviewed patient's old medical records in [place]   Chief Complaint: SOB.   HPI: [name] [name] is a 58 y.o. male with past medical history significant for multiple myeloma on Revlimid,  pulmonary hypertension, chronic hypoxic respiratory failure, chronic left-sided hydropneumothorax, bronchiectasis, allergic bronchopulmonary aspergillosis, heterozygous alpha 1 antitrypsin deficiency, originally was treated for empyema with VATS decortication in 2016 who presents complaining of worsening shortness of breath that is started 3 days prior to admission.  He also report worsening productive cough, hemoptysis over the last 3 days.  He reports fever with temperature 100 at home. He denies chest pain, nausea, vomiting.  Evaluation in the ED: Sodium 130, potassium 3.4, chloride 94, calcium 7.5, lactic acid at 4, white blood cell 16, hemoglobin 12, platelets 142. Chest x-ray:Chronic lower lobe opacities, likely scarring/atelectasis, although superimposed right lower lobe pneumonia is not excluded. Moderate left and small right pleural effusions, chronic. Associated calcified pleural plaques, suggesting asbestos related pleural disease.  Review of Systems: All systems reviewed and apart from history of presenting illness, are negative.  Past Medical History:  Diagnosis Date  . Acute on chronic respiratory failure ([place]) 02/07/2018  . Hypertension   . Kidney stones   . Pulmonary hypertension ([place])    Past Surgical History:  Procedure Laterality Date  . CARDIAC CATHETERIZATION N/A 01/18/2016   Procedure: Right Heart Cath;  Surgeon: Jolaine Artist, MD;  Location: [facility];  Service: Cardiovascular;  Laterality: N/A;  . CARDIAC CATHETERIZATION N/A 06/24/2016   Procedure: Right Heart Cath;  Surgeon: Jolaine Artist, MD;   Location: [facility];  Service: Cardiovascular;  Laterality: N/A;  . IR RADIOLOGIST EVAL & MGMT  05/27/2018  . IR RADIOLOGIST EVAL & MGMT  09/01/2018  . KIDNEY SURGERY    . VIDEO ASSISTED THORACOSCOPY (VATS)/DECORTICATION Left 2003  . VIDEO ASSISTED THORACOSCOPY (VATS)/DECORTICATION Left 10/02/2015   Procedure: VIDEO ASSISTED THORACOSCOPY (VATS)/DECORTICATION and drainage of chronic empyena;  Surgeon: [name], MD;  Location: [facility];  Service: Thoracic;  Laterality: Left;  Marland Kitchen VIDEO BRONCHOSCOPY N/A 10/02/2015   Procedure: VIDEO BRONCHOSCOPY;  Surgeon: [name], MD;  Location: [facility];  Service: Thoracic;  Laterality: N/A;   Social History:  reports that he has never smoked. He has never used smokeless tobacco. He reports current alcohol use. He reports that he does not use drugs.   No Known Allergies  Family History  Problem Relation Age of Onset  . Hypertension Other     Prior to Admission medications   Medication Sig Start Date End Date Taking? Authorizing Provider  acyclovir (ZOVIRAX) 400 MG tablet Take 1 tablet (400 mg total) by mouth daily. 11/18/18   Brunetta Genera, MD  aspirin EC 81 MG tablet Take 1 tablet (81 mg total) by mouth daily. 11/18/18   Brunetta Genera, MD  dexamethasone (DECADRON) 4 MG tablet Take 5 tablets (20 mg total) by mouth once a week. 11/18/18   Brunetta Genera, MD  docusate sodium (COLACE) 50 MG capsule Take 1 capsule (50 mg total) by mouth daily. 03/06/18   [name], PA-C  Fluticasone-Umeclidin-Vilant (TRELEGY ELLIPTA) 100-62.5-25 MCG/INH AEPB Inhale 1 puff into the lungs daily. 07/31/18   Tanda Rockers, MD  guaiFENesin (MUCINEX) 600 MG 12 hr tablet Take 2 tablets (1,200 mg total) by mouth  2 (two) times daily. 09/05/18   Oswald Hillock, MD  lenalidomide (REVLIMID) 25 MG capsule Take 1 capsule (25 mg total) by mouth daily. Take for 21 days and take 7 days off. Authorization#[id number] 10/19/18 11/18/18   Brunetta Genera,  MD  nortriptyline (PAMELOR) 25 MG capsule Take 1 capsule (25 mg total) by mouth at bedtime. 08/20/18   Brunetta Genera, MD  omeprazole (PRILOSEC) 40 MG capsule Take 1 capsule (40 mg total) by mouth daily. 08/20/18   Brunetta Genera, MD  ondansetron (ZOFRAN) 8 MG tablet Take 1 tablet (8 mg total) by mouth every 8 (eight) hours as needed for nausea. 09/23/18   Brunetta Genera, MD  oxyCODONE-acetaminophen (PERCOCET/ROXICET) 5-325 MG tablet Take 1 tablet by mouth every 6 (six) hours as needed for severe pain. 08/27/18   Brunetta Genera, MD  OXYGEN 2lpm BEDTIME ONLY    [provider]  PROVENTIL HFA 108 (90 Base) MCG/ACT inhaler INHALE 2 PUFFS EVERY 6 HOURS AS NEEDED FOR WHEEZING OR SHORTNESS OF BREATH. 10/20/18   Juanito Doom, MD   Physical Exam: Vitals:   12/06/18 1257 12/06/18 1326 12/06/18 1344 12/06/18 1405  BP: 102/66 102/70 118/76   Pulse: 90 100 88   Resp: (!) 21 (!) 41 (!) 21   Temp:      TempSrc:      SpO2: 98% 91% 98% 97%     General exam: Thin appearing, mild distress, using accessory muscles to breath  Head, eyes and ENT: Nontraumatic and normocephalic. Pupils equally reacting to light and accommodation. Oral mucosa moist.  Neck: Supple. No JVD, carotid bruit or thyromegaly.  Lymphatics: No lymphadenopathy.  Respiratory system: Sporadic wheezing, bilateral rhonchus, using accessory muscles to breath  Cardiovascular system: S1 and S2 heard, RRR. No JVD, murmurs, gallops, clicks or pedal edema.  Gastrointestinal system: Abdomen is nondistended, soft and nontender. Normal bowel sounds heard. No organomegaly or masses appreciated.  Central nervous system: Alert and oriented. No focal neurological deficits.  Extremities: Symmetric 5 x 5 power. Peripheral pulses symmetrically felt.   Skin: No rashes or acute findings.  Musculoskeletal system: Negative exam.  Psychiatry: Pleasant and cooperative.   Labs on Admission:  Basic Metabolic  Panel: Recent Labs  Lab 12/02/18 1418 12/06/18 1041  NA 141 130*  K 3.8 3.4*  CL 102 94*  CO2 30 23  GLUCOSE 90 162*  BUN 14 13  CREATININE 0.96 1.06  CALCIUM 8.8* 7.5*   Liver Function Tests: Recent Labs  Lab 12/02/18 1418  AST 18  ALT 12  ALKPHOS 70  BILITOT 0.3  PROT 7.4  ALBUMIN 3.4*   No results for input(s): LIPASE, AMYLASE in the last 168 hours. No results for input(s): AMMONIA in the last 168 hours. CBC: Recent Labs  Lab 12/02/18 1418 12/06/18 1041  WBC 5.7 16.1*  NEUTROABS 4.1  --   HGB 11.5* 12.6*  HCT 36.7* 39.0  MCV 79.4* 77.1*  PLT 326 142*   Cardiac Enzymes: No results for input(s): CKTOTAL, CKMB, CKMBINDEX, TROPONINI in the last 168 hours.  BNP (last 3 results) No results for input(s): PROBNP in the last 8760 hours. CBG: No results for input(s): GLUCAP in the last 168 hours.  Radiological Exams on Admission: Dg Chest 2 View  Result Date: 12/06/2018 CLINICAL DATA:  Chest pain, shortness of breath EXAM: CHEST - 2 VIEW COMPARISON:  CTA chest dated 09/03/2018. FINDINGS: Bilateral lower lobe opacities, chronic, likely scarring/atelectasis. However, when correlating with priors, superimposed right lower  lobe pneumonia is not excluded. Moderate left and small right pleural effusions, partially loculated on the left, chronic when correlating with CT. Calcified pleural plaques. The heart is normal in size. Pulmonary arterial hypertension. Pulmonary vascular congestion without frank interstitial edema. No pneumothorax IMPRESSION: Chronic lower lobe opacities, likely scarring/atelectasis, although superimposed right lower lobe pneumonia is not excluded. Moderate left and small right pleural effusions, chronic. Associated calcified pleural plaques, suggesting asbestos related pleural disease. Electronically Signed   By: Julian Hy M.D.   On: 12/06/2018 11:39      Assessment/Plan Principal Problem:   Sepsis ([place]) Active Problems:   Pleural effusion,  left   COPD GOLD IV criteria but never smoked    Chronic respiratory failure with hypoxia (HCC)   Multiple myeloma (HCC)   Acute on chronic respiratory failure with hypoxia (HCC)   Grade II diastolic dysfunction   Community acquired pneumonia   Protein-calorie malnutrition, severe   1-Sepsis; patient presents with fever, hemoptysis, cough, tachypnea, leukocytosis, lactic acidosis.  Source  Suspect; pneumonia. Sputum culture, blood cultures ordered.  UA ordered. -IV vancomycin, IV cefepime. -IV fluids.  2-Acute on chronic hypoxic respiratory failure: History of chronic left hydropneumothorax, bronchiectasis, allergic pulmonary aspergillosis; COPD Gold 4 -Possible related to pneumonia.,  Versus COPD exacerbation. -Schedule nebulizer treatment, IV Solu-Medrol. -BiPAP PRN. -CCM consulted.  3-Multiple myeloma: Follow with Dr. Linwood Dibbles, added to rounding team. Will hold Revlimid for now.   4-Hypokalemia: Replete orally.  5 Hyponatremia: Continue with IV fluids.   DVT Prophylaxis: Lovenox Code Status: Full code.   Family Communication: Care discussed with patient Disposition Plan: Admit to the stepdown unit, for acute on chronic hypoxic respiratory failure, sepsis..  Time spent: 75 minutes.      Elmarie Shiley MD [facility]   12/06/2018, 2:10 PM

## 2018-12-06 NOTE — ED Notes (Signed)
Date and time results received: 12/06/18 1548 (use smartphrase ".now" to insert current time)  Test: lactic acid Critical Value: 3.2  Name of Provider Notified: Lawerance Sabal  Orders Received? Or Actions Taken?: Actions Taken: reported lactic acid to [facility] so she could report it to admitting doctor.

## 2018-12-06 NOTE — ED Provider Notes (Signed)
  Face-to-face evaluation   History: He presents for evaluation of shortness of breath for several days.  Physical exam: Alert, calm, cooperative.  Lungs with Rales and rhonchi right lower.  There is no increased work of breathing.  Chest x-ray indicates right-sided pneumonia.  This needs to be treated as HCAP.  Medical screening examination/treatment/procedure(s) were conducted as a shared visit with non-physician practitioner(s) and myself.  I personally evaluated the patient during the encounter    Daleen Bo, MD 12/10/18 2251

## 2018-12-06 NOTE — ED Triage Notes (Signed)
Pt c/o chest pain and shortness of breath x 2 days. Pt states pain and sob are worsening today.

## 2018-12-06 NOTE — ED Notes (Signed)
Date and time results received: 12/06/18  (use smartphrase ".now" to insert current time)  Test: lactic acid Critical Value: 4.0  Name of Provider Notified: Dr.  Eulis Foster  Orders Received? Or Actions Taken?: none

## 2018-12-06 NOTE — Progress Notes (Signed)
A consult was received from an ED physician for Vancomycin per pharmacy dosing.  The patient's profile has been reviewed for ht/wt/allergies/indication/available labs.    A one time order has been placed for Vancomycin 750mg  IV x 1.   Further antibiotics/pharmacy consults should be ordered by admitting physician if indicated.                       Thank you, Everette Rank, PharmD 12/06/2018  11:36 AM

## 2018-12-06 NOTE — ED Provider Notes (Signed)
[facility] DEPT Provider Note   CSN: [id number] Arrival date & time: 12/06/18  1022     History   Chief Complaint Chief Complaint  Patient presents with  . Shortness of Breath  . Chest Pain    HPI [name] [name] is a 58 y.o. male with history of multiple myeloma currently shaving chemotherapy presenting today for 2-day history of fever, cough and shortness of breath.  Symptoms have been progressive since onset.  Additionally patient with history of COPD and has been using her breathing treatments with no improvement.  Patient has not measured fever at home but states that he has felt warm.  On my evaluation patient denies chest pain however does endorse worsening shortness of breath with his cough.  To my synopsis, states that he has been noticing white yellow sputum for the past few days. HPI  Past Medical History:  Diagnosis Date  . Acute on chronic respiratory failure ([place]) 02/07/2018  . Hypertension   . Kidney stones   . Pulmonary hypertension [facility])     Patient Active Problem List   Diagnosis Date Noted  . Protein-calorie malnutrition, severe 09/05/2018  . CAP (community acquired pneumonia) 09/03/2018  . Pulmonary infiltrate on chest x-ray   . High serum lactate 02/07/2018  . Grade II diastolic dysfunction 08/31/2535  . Acute on chronic respiratory failure ([place]) 02/07/2018  . Acute on chronic respiratory failure with hypoxia ([place]) 01/27/2018  . Counseling regarding advanced care planning and goals of care 11/08/2017  . Multiple myeloma ([place]) 11/07/2017  . Facility-acquired pneumonia   . Chronic respiratory failure with hypoxia ([place]) 03/26/2016  . COPD GOLD IV criteria but never smoked  03/11/2016  . Pulmonary HTN ([place])   . Hyperkalemia   . Diarrhea 09/27/2015  . Chest pain 09/27/2015  . Unintentional weight loss 09/27/2015  . Abdominal cramps 09/26/2015  . Pleural effusion, left 09/26/2015  . Viral hepatitis C 08/06/2015    Past  Surgical History:  Procedure Laterality Date  . CARDIAC CATHETERIZATION N/A 01/18/2016   Procedure: Right Heart Cath;  Surgeon: Jolaine Artist, MD;  Location: [facility];  Service: Cardiovascular;  Laterality: N/A;  . CARDIAC CATHETERIZATION N/A 06/24/2016   Procedure: Right Heart Cath;  Surgeon: Jolaine Artist, MD;  Location: [facility];  Service: Cardiovascular;  Laterality: N/A;  . IR RADIOLOGIST EVAL & MGMT  05/27/2018  . IR RADIOLOGIST EVAL & MGMT  09/01/2018  . KIDNEY SURGERY    . VIDEO ASSISTED THORACOSCOPY (VATS)/DECORTICATION Left 2003  . VIDEO ASSISTED THORACOSCOPY (VATS)/DECORTICATION Left 10/02/2015   Procedure: VIDEO ASSISTED THORACOSCOPY (VATS)/DECORTICATION and drainage of chronic empyena;  Surgeon: [name], MD;  Location: [facility];  Service: Thoracic;  Laterality: Left;  Marland Kitchen VIDEO BRONCHOSCOPY N/A 10/02/2015   Procedure: VIDEO BRONCHOSCOPY;  Surgeon: [name], MD;  Location: [facility];  Service: Thoracic;  Laterality: N/A;        Home Medications    Prior to Admission medications   Medication Sig Start Date End Date Taking? Authorizing Provider  acyclovir (ZOVIRAX) 400 MG tablet Take 1 tablet (400 mg total) by mouth daily. 11/18/18   Brunetta Genera, MD  aspirin EC 81 MG tablet Take 1 tablet (81 mg total) by mouth daily. 11/18/18   Brunetta Genera, MD  dexamethasone (DECADRON) 4 MG tablet Take 5 tablets (20 mg total) by mouth once a week. 11/18/18   Brunetta Genera, MD  docusate sodium (COLACE) 50 MG capsule Take  1 capsule (50 mg total) by mouth daily. 03/06/18   [name], PA-C  Fluticasone-Umeclidin-Vilant (TRELEGY ELLIPTA) 100-62.5-25 MCG/INH AEPB Inhale 1 puff into the lungs daily. 07/31/18   Tanda Rockers, MD  guaiFENesin (MUCINEX) 600 MG 12 hr tablet Take 2 tablets (1,200 mg total) by mouth 2 (two) times daily. 09/05/18   Oswald Hillock, MD  lenalidomide (REVLIMID) 25 MG capsule Take 1 capsule (25 mg total) by mouth  daily. Take for 21 days and take 7 days off. Authorization#[id number] 10/19/18 11/18/18   Brunetta Genera, MD  nortriptyline (PAMELOR) 25 MG capsule Take 1 capsule (25 mg total) by mouth at bedtime. 08/20/18   Brunetta Genera, MD  omeprazole (PRILOSEC) 40 MG capsule Take 1 capsule (40 mg total) by mouth daily. 08/20/18   Brunetta Genera, MD  ondansetron (ZOFRAN) 8 MG tablet Take 1 tablet (8 mg total) by mouth every 8 (eight) hours as needed for nausea. 09/23/18   Brunetta Genera, MD  oxyCODONE-acetaminophen (PERCOCET/ROXICET) 5-325 MG tablet Take 1 tablet by mouth every 6 (six) hours as needed for severe pain. 08/27/18   Brunetta Genera, MD  OXYGEN 2lpm BEDTIME ONLY    [provider]  PROVENTIL HFA 108 (90 Base) MCG/ACT inhaler INHALE 2 PUFFS EVERY 6 HOURS AS NEEDED FOR WHEEZING OR SHORTNESS OF BREATH. 10/20/18   Juanito Doom, MD    Family History Family History  Problem Relation Age of Onset  . Hypertension Other     Social History Social History   Tobacco Use  . Smoking status: Never Smoker  . Smokeless tobacco: Never Used  Substance Use Topics  . Alcohol use: Yes  . Drug use: No     Allergies   Patient has no known allergies.   Review of Systems Review of Systems  Constitutional: Positive for fever. Negative for chills.  Respiratory: Positive for cough and shortness of breath.   Cardiovascular: Negative.  Negative for chest pain.  Gastrointestinal: Negative.  Negative for abdominal pain, diarrhea, nausea and vomiting.  Genitourinary: Negative.  Negative for dysuria and hematuria.  Musculoskeletal: Negative.  Negative for arthralgias and myalgias.  Neurological: Negative.  Negative for dizziness and headaches.  All other systems reviewed and are negative.  Physical Exam Updated Vital Signs BP 102/66 (BP Location: Right Arm)   Pulse 90   Temp (!) 100.4 F (38 C) (Rectal)   Resp (!) 21   SpO2 98%   Physical Exam Constitutional:       General: He is not in acute distress.    Appearance: He is well-developed. He is not diaphoretic.  HENT:     Head: Normocephalic and atraumatic.     Right Ear: External ear normal.     Left Ear: External ear normal.     Nose: Nose normal.     Mouth/Throat:     Mouth: Mucous membranes are moist.     Pharynx: Oropharynx is clear.  Eyes:     Pupils: Pupils are equal, round, and reactive to light.  Neck:     Musculoskeletal: Normal range of motion.     Trachea: Trachea normal. No tracheal deviation.  Cardiovascular:     Rate and Rhythm: Regular rhythm. Tachycardia present.  Pulmonary:     Effort: Pulmonary effort is normal. No accessory muscle usage or respiratory distress.     Breath sounds: Examination of the right-middle field reveals rhonchi. Examination of the right-lower field reveals rhonchi. Rhonchi present.  Chest:  Chest wall: Tenderness present. No deformity or crepitus.  Abdominal:     Palpations: Abdomen is soft.     Tenderness: There is no abdominal tenderness. There is no guarding or rebound.  Musculoskeletal: Normal range of motion.     Right lower leg: He exhibits no tenderness. No edema.     Left lower leg: He exhibits no tenderness. No edema.  Skin:    General: Skin is warm and dry.     Capillary Refill: Capillary refill takes less than 2 seconds.  Neurological:     Mental Status: He is alert and oriented to person, place, and time.  Psychiatric:        Behavior: Behavior normal.    ED Treatments / Results  Labs (all labs ordered are listed, but only abnormal results are displayed) Labs Reviewed  BASIC METABOLIC PANEL - Abnormal; Notable for the following components:      Result Value   Sodium 130 (*)    Potassium 3.4 (*)    Chloride 94 (*)    Glucose, Bld 162 (*)    Calcium 7.5 (*)    All other components within normal limits  CBC - Abnormal; Notable for the following components:   WBC 16.1 (*)    Hemoglobin 12.6 (*)    MCV 77.1 (*)    MCH  24.9 (*)    RDW 16.0 (*)    Platelets 142 (*)    All other components within normal limits  LACTIC ACID, PLASMA - Abnormal; Notable for the following components:   Lactic Acid, Venous 4.0 (*)    All other components within normal limits  CULTURE, BLOOD (ROUTINE X 2)  CULTURE, BLOOD (ROUTINE X 2)  LACTIC ACID, PLASMA  URINALYSIS, ROUTINE W REFLEX MICROSCOPIC  I-STAT TROPONIN, ED    EKG EKG Interpretation  Date/Time:  Sunday December 06 2018 10:31:42 EST Ventricular Rate:  109 PR Interval:    QRS Duration: 92 QT Interval:  364 QTC Calculation: 491 R Axis:   107 Text Interpretation:  Sinus tachycardia LAE, consider biatrial enlargement Probable lateral infarct, age indeterminate Abnrm T, consider ischemia, anterolateral lds Since last tracing rate faster Nonspecific ST and T wave abnormality Confirmed by Daleen Bo [phone]) on 12/06/2018 11:12:08 AM   EKG Interpretation  Date/Time:  Sunday December 06 2018 11:04:31 EST Ventricular Rate:  99 PR Interval:    QRS Duration: 94 QT Interval:  365 QTC Calculation: 469 R Axis:   100 Text Interpretation:  Sinus rhythm Biatrial enlargement Right axis deviation Consider left ventricular hypertrophy Abnrm T, consider ischemia, anterolateral lds Since last tracing of earlier today No significant change was found Confirmed by Daleen Bo [phone]) on 12/06/2018 11:13:58 AM        Radiology Dg Chest 2 View  Result Date: 12/06/2018 CLINICAL DATA:  Chest pain, shortness of breath EXAM: CHEST - 2 VIEW COMPARISON:  CTA chest dated 09/03/2018. FINDINGS: Bilateral lower lobe opacities, chronic, likely scarring/atelectasis. However, when correlating with priors, superimposed right lower lobe pneumonia is not excluded. Moderate left and small right pleural effusions, partially loculated on the left, chronic when correlating with CT. Calcified pleural plaques. The heart is normal in size. Pulmonary arterial hypertension. Pulmonary vascular congestion  without frank interstitial edema. No pneumothorax IMPRESSION: Chronic lower lobe opacities, likely scarring/atelectasis, although superimposed right lower lobe pneumonia is not excluded. Moderate left and small right pleural effusions, chronic. Associated calcified pleural plaques, suggesting asbestos related pleural disease. Electronically Signed   By: Henderson Newcomer.D.  On: 12/06/2018 11:39    Procedures .Critical Care Performed by: Deliah Boston, PA-C Authorized by: Deliah Boston, PA-C   Critical care provider statement:    Critical care time (minutes):  31   Critical care was necessary to treat or prevent imminent or life-threatening deterioration of the following conditions:  Sepsis   Critical care was time spent personally by me on the following activities:  Discussions with consultants, evaluation of patient's response to treatment, examination of patient, ordering and performing treatments and interventions, ordering and review of laboratory studies, ordering and review of radiographic studies, pulse oximetry, re-evaluation of patient's condition, obtaining history from patient or surrogate and review of old charts   (including critical care time)  Medications Ordered in ED Medications  sodium chloride flush (NS) 0.9 % injection 3 mL (has no administration in time range)  sodium chloride 0.9 % bolus 1,000 mL (1,000 mLs Intravenous New Bag/Given 12/06/18 1214)  vancomycin (VANCOCIN) IVPB 750 mg/150 ml premix (has no administration in time range)  sodium chloride 0.9 % bolus 1,500 mL (has no administration in time range)  ceFEPIme (MAXIPIME) 1 g in sodium chloride 0.9 % 100 mL IVPB (1 g Intravenous New Bag/Given 12/06/18 1211)     Initial Impression / Assessment and Plan / ED Course  I have reviewed the triage vital signs and the nursing notes.  Pertinent labs & imaging results that were available during my care of the patient were reviewed by me and considered in my  medical decision making (see chart for details).  Clinical Course as of Dec 06 1299  Sun Dec 06, 2018  1130 Rectal temperature of 104 F, patient now also with tachycardia, meet SIRS criteria, sepsis   [BM]    Clinical Course User Index [BM] Deliah Boston, PA-C   58 year old male with history of multiple myeloma currently receiving chemotherapy presents for 2-day history of fever, cough and shortness of breath.  Upon arrival patient noted to be tachypneic, tachycardic.  Shortly after rectal temperature was obtained showing fever.  Code sepsis was called.  Fluid bolus given CBC shows leukocytosis of 16.1 Chest x-ray shows possible right-sided pneumonia  Patient seen and evaluated by Dr. Eulis Foster, patient started on empiric healthcare associated pneumonia antibiotics. ---------------- Additional lab work pending, care handoff given to Raymond PA-C at shift change.  Plan at shift change is to await additional lab results, reassess and disposition appropriately, predict admission at this point.   Note: Portions of this report may have been transcribed using voice recognition software. Every effort was made to ensure accuracy; however, inadvertent computerized transcription errors may still be present.  Final Clinical Impressions(s) / ED Diagnoses   Final diagnoses:  None    ED Discharge Orders    None       Gari Crown 12/06/18 1302    Daleen Bo, MD 12/06/18 1610

## 2018-12-06 NOTE — Consult Note (Addendum)
NAME:  [name] [name], MRN:  [id number], DOB:  [birth year], LOS: 0 ADMISSION DATE:  12/06/2018, CONSULTATION DATE: 12/06/2018 REFERRING MD: Dr. 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  Admitted with dyspnea, cough, hemoptysis and chest x-ray evidence for R HCAP  History of present illness   58 year old never smoker with multiple myeloma being treated with Revlimid, Dexamethasone, and Velcade.  Followed in our office by Dr. 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  Also secondary pulmonary hypertension in the setting of all the above.  Managed on Trelegy, oxygen.   He is here with progressive cough, blood-tinged sputum, low-grade fever that began about 3 days prior to admission.  His last myeloma treatment was last Thursday.  Chest x-ray shows possible progression of right lower lobe airspace disease superimposed on his chronic changes.  He has been tachypneic and tachycardic, has an elevated lactate = 4.    Past Medical History   Past Medical History:  Diagnosis Date  . Acute on chronic respiratory failure ([place]) 02/07/2018  . Hypertension   . Kidney stones   . Pulmonary hypertension ([place])     Significant Hospital Events     Consults:    Procedures:    Significant Diagnostic Tests:  CT chest 2/2 >>   Micro Data:  Resp 2/2 >>  Blood 2/2 >>   Antimicrobials:  Vanco 2/2 >>  Cefepime 2/2 >>    Interim history/subjective:  Patient notes that he continues to cough has seen some pink blood-tinged sputum He has had some nausea and poor p.o. intake.  Denies diarrhea  Objective   Blood  pressure 108/72, pulse 90, temperature (!) 100.4 F (38 C), temperature source Rectal, resp. rate (!) 29, SpO2 98 %.        Intake/Output Summary (Last 24 hours) at 12/06/2018 1458 Last data filed at 12/06/2018 1409 Gross per 24 hour  Intake 1243.65 ml  Output -  Net 1243.65 ml   There were no vitals filed for this visit.  Examination: General: Thin [place] gentleman, no distress HENT: OP clear, no lesions, no epistaxis Lungs: Very decreased on the left with breath sounds heard only superiorly, right-sided inspiratory squeaks and rhonchi Cardiovascular: Regular, no murmur Abdomen: Soft, nontender, nondistended with positive bowel sounds Extremities: Thin, warm, no edema Neuro: Awake, alert, converses appropriately, answers all questions, moves all extremities, nonfocal  Resolved Hospital Problem list     Assessment & Plan:   Acute on chronic hypoxemic respiratory failure with increased work of breathing -Supported with oxygen, bronchodilators -Agree with BiPAP as needed if his work of breathing increases.  He does have the mental status and airway protection to tolerate.  He does not look like he needs BiPAP at this time  Right lower lobe healthcare associated pneumonia.  Probably bacterial and due to his known bronchiectasis, immunosuppression due to chemotherapy. Must consider the possibility of invasive aspergillosis given his colonization.  I think this will need to be evaluated with further imaging. -Agree with antibiotics as ordered, vancomycin and cefepime.  I do not see any culture data that would suggest colonization with a resistant organism -CT chest without contrast to better characterize any new  infiltrate, his left pleural space -Check influenza panel -Depending on his response to antibiotic therapy low threshold to consider bronchoscopy to assess for organisms  Severe sepsis, lactate 4 as his volume resuscitation is ongoing -Follow lactate for clearance -Continue  volume resuscitation -Cultures obtained and pending -Antibiotics as above -Consider procalcitonin to trend and assist with response to therapy  Bronchiectasis and COPD (alpha-1 antitrypsin deficient) with acute exacerbation -Steroids as ordered -Bronchodilators:  -Check alpha-1 antitrypsin level although he is a heterozygote, has never required replacement before -Secretion management techniques including flutter valve, chest vest  Left hydropneumothorax, remote VATS decortication for empyema -Not clear that any intervention needs to be made.  We will be able to assess for any change when his CT scan is done, compare with October 2019  Multiple myeloma -Agree with holding Revlimid for now -Acyclovir ordered   Best practice:  Diet: Regular Pain/Anxiety/Delirium protocol (if indicated): N/A VAP protocol (if indicated): N/A DVT prophylaxis: Heparin GI prophylaxis: N/A Glucose control: NA Mobility: Out of bed ad lib. Code Status: Full code Family Communication: No family available 2/2 Disposition: SDU  Labs   CBC: Recent Labs  Lab 12/02/18 1418 12/06/18 1041  WBC 5.7 16.1*  NEUTROABS 4.1  --   HGB 11.5* 12.6*  HCT 36.7* 39.0  MCV 79.4* 77.1*  PLT 326 142*    Basic Metabolic Panel: Recent Labs  Lab 12/02/18 1418 12/06/18 1041  NA 141 130*  K 3.8 3.4*  CL 102 94*  CO2 30 23  GLUCOSE 90 162*  BUN 14 13  CREATININE 0.96 1.06  CALCIUM 8.8* 7.5*   GFR: Estimated Creatinine Clearance: 43.7 mL/min (by C-G formula based on SCr of 1.06 mg/dL). Recent Labs  Lab 12/02/18 1418 12/06/18 1041 12/06/18 1153  WBC 5.7 16.1*  --   LATICACIDVEN  --   --  4.0*    Liver Function Tests: Recent Labs  Lab 12/02/18 1418  AST 18  ALT 12  ALKPHOS 70  BILITOT 0.3  PROT 7.4  ALBUMIN 3.4*   No results for input(s): LIPASE, AMYLASE in the last 168 hours. No results for input(s): AMMONIA in the last 168 hours.  ABG    Component Value Date/Time   PHART 7.446  02/08/2018 0539   PCO2ART 43.9 02/08/2018 0539   PO2ART 68.0 (L) 02/08/2018 0539   HCO3 29.9 (H) 02/08/2018 0539   TCO2 32 06/24/2016 0902   O2SAT 94.3 02/08/2018 0539     Coagulation Profile: No results for input(s): INR, PROTIME in the last 168 hours.  Cardiac Enzymes: No results for input(s): CKTOTAL, CKMB, CKMBINDEX, TROPONINI in the last 168 hours.  HbA1C: No results found for: HGBA1C  CBG: No results for input(s): GLUCAP in the last 168 hours.  Review of Systems:   Some nausea, poor p.o. intake, emesis for 2 to 3 days Cough with mid chest discomfort that is somewhat musculoskeletal, productive of yellow-tinged with blood Low-grade fever No diarrhea  Past Medical History  He,  has a past medical history of Acute on chronic respiratory failure ([place]) (02/07/2018), Hypertension, Kidney stones, and Pulmonary hypertension ([place]).   Surgical History    Past Surgical History:  Procedure Laterality Date  . CARDIAC CATHETERIZATION N/A 01/18/2016   Procedure: Right Heart Cath;  Surgeon: Jolaine Artist, MD;  Location: [place] [facility];  Service: Cardiovascular;  Laterality: N/A;  . CARDIAC CATHETERIZATION N/A 06/24/2016   Procedure: Right Heart Cath;  Surgeon: Jolaine Artist, MD;  Location: [place];  Service: Cardiovascular;  Laterality: N/A;  . IR RADIOLOGIST EVAL & MGMT  05/27/2018  . IR RADIOLOGIST EVAL & MGMT  09/01/2018  . KIDNEY SURGERY    . VIDEO ASSISTED THORACOSCOPY (VATS)/DECORTICATION Left 2003  . VIDEO ASSISTED THORACOSCOPY (VATS)/DECORTICATION Left 10/02/2015   Procedure: VIDEO ASSISTED THORACOSCOPY (VATS)/DECORTICATION and drainage of chronic empyena;  Surgeon: [name], MD;  Location: [facility];  Service: Thoracic;  Laterality: Left;  Marland Kitchen VIDEO BRONCHOSCOPY N/A 10/02/2015   Procedure: VIDEO BRONCHOSCOPY;  Surgeon: [name], MD;  Location: [facility];  Service: Thoracic;  Laterality: N/A;     Social History   reports that he has never  smoked. He has never used smokeless tobacco. He reports current alcohol use. He reports that he does not use drugs.   Family History   His family history includes Hypertension in an other family member.   Allergies No Known Allergies   Home Medications  Prior to Admission medications   Medication Sig Start Date End Date Taking? Authorizing Provider  acyclovir (ZOVIRAX) 400 MG tablet Take 1 tablet (400 mg total) by mouth daily. 11/18/18  Yes Brunetta Genera, MD  aspirin EC 81 MG tablet Take 1 tablet (81 mg total) by mouth daily. 11/18/18  Yes Brunetta Genera, MD  dexamethasone (DECADRON) 4 MG tablet Take 5 tablets (20 mg total) by mouth once a week. 11/18/18  Yes Brunetta Genera, MD  docusate sodium (COLACE) 50 MG capsule Take 1 capsule (50 mg total) by mouth daily. 03/06/18  Yes [name], PA-C  Fluticasone-Umeclidin-Vilant (TRELEGY ELLIPTA) 100-62.5-25 MCG/INH AEPB Inhale 1 puff into the lungs daily. 07/31/18  Yes Tanda Rockers, MD  lenalidomide (REVLIMID) 25 MG capsule Take 1 capsule (25 mg total) by mouth daily. Take for 21 days and take 7 days off. Authorization#[id number] 10/19/18 11/18/18  Yes Brunetta Genera, MD  omeprazole (PRILOSEC) 40 MG capsule Take 1 capsule (40 mg total) by mouth daily. 08/20/18  Yes Brunetta Genera, MD  oxyCODONE-acetaminophen (PERCOCET/ROXICET) 5-325 MG tablet Take 1 tablet by mouth every 6 (six) hours as needed for severe pain. 08/27/18  Yes Brunetta Genera, MD  OXYGEN 3L BEDTIME ONLY   Yes [provider]  PROVENTIL HFA 108 (90 Base) MCG/ACT inhaler INHALE 2 PUFFS EVERY 6 HOURS AS NEEDED FOR WHEEZING OR SHORTNESS OF BREATH. 10/20/18  Yes Juanito Doom, MD  guaiFENesin (MUCINEX) 600 MG 12 hr tablet Take 2 tablets (1,200 mg total) by mouth 2 (two) times daily. Patient not taking: Reported on 12/06/2018 09/05/18   Oswald Hillock, MD  nortriptyline (PAMELOR) 25 MG capsule Take 1 capsule (25 mg total) by mouth at  bedtime. Patient not taking: Reported on 12/06/2018 08/20/18   Brunetta Genera, MD  ondansetron (ZOFRAN) 8 MG tablet Take 1 tablet (8 mg total) by mouth every 8 (eight) hours as needed for nausea. Patient not taking: Reported on 12/06/2018 09/23/18   Brunetta Genera, MD      Baltazar Apo, MD, PhD 12/06/2018, 3:36 PM [facility] [phone] or if no answer [phone]

## 2018-12-07 DIAGNOSIS — B9561 Methicillin susceptible Staphylococcus aureus infection as the cause of diseases classified elsewhere: Secondary | ICD-10-CM

## 2018-12-07 DIAGNOSIS — E8801 Alpha-1-antitrypsin deficiency: Secondary | ICD-10-CM | POA: Diagnosis present

## 2018-12-07 DIAGNOSIS — E870 Hyperosmolality and hypernatremia: Secondary | ICD-10-CM | POA: Diagnosis not present

## 2018-12-07 DIAGNOSIS — J918 Pleural effusion in other conditions classified elsewhere: Secondary | ICD-10-CM | POA: Diagnosis present

## 2018-12-07 DIAGNOSIS — I2729 Other secondary pulmonary hypertension: Secondary | ICD-10-CM | POA: Diagnosis present

## 2018-12-07 DIAGNOSIS — R7881 Bacteremia: Secondary | ICD-10-CM

## 2018-12-07 DIAGNOSIS — R64 Cachexia: Secondary | ICD-10-CM | POA: Diagnosis present

## 2018-12-07 DIAGNOSIS — J479 Bronchiectasis, uncomplicated: Secondary | ICD-10-CM | POA: Diagnosis not present

## 2018-12-07 DIAGNOSIS — E876 Hypokalemia: Secondary | ICD-10-CM | POA: Diagnosis present

## 2018-12-07 DIAGNOSIS — R0603 Acute respiratory distress: Secondary | ICD-10-CM | POA: Diagnosis not present

## 2018-12-07 DIAGNOSIS — J449 Chronic obstructive pulmonary disease, unspecified: Secondary | ICD-10-CM

## 2018-12-07 DIAGNOSIS — E871 Hypo-osmolality and hyponatremia: Secondary | ICD-10-CM | POA: Diagnosis present

## 2018-12-07 DIAGNOSIS — R6521 Severe sepsis with septic shock: Secondary | ICD-10-CM | POA: Diagnosis not present

## 2018-12-07 DIAGNOSIS — Z681 Body mass index (BMI) 19 or less, adult: Secondary | ICD-10-CM | POA: Diagnosis not present

## 2018-12-07 DIAGNOSIS — J189 Pneumonia, unspecified organism: Secondary | ICD-10-CM | POA: Diagnosis present

## 2018-12-07 DIAGNOSIS — B4481 Allergic bronchopulmonary aspergillosis: Secondary | ICD-10-CM | POA: Diagnosis present

## 2018-12-07 DIAGNOSIS — E860 Dehydration: Secondary | ICD-10-CM | POA: Diagnosis present

## 2018-12-07 DIAGNOSIS — I4891 Unspecified atrial fibrillation: Secondary | ICD-10-CM | POA: Diagnosis present

## 2018-12-07 DIAGNOSIS — A419 Sepsis, unspecified organism: Secondary | ICD-10-CM | POA: Diagnosis not present

## 2018-12-07 DIAGNOSIS — D509 Iron deficiency anemia, unspecified: Secondary | ICD-10-CM | POA: Diagnosis not present

## 2018-12-07 DIAGNOSIS — Y95 Nosocomial condition: Secondary | ICD-10-CM | POA: Diagnosis present

## 2018-12-07 DIAGNOSIS — J181 Lobar pneumonia, unspecified organism: Secondary | ICD-10-CM | POA: Diagnosis not present

## 2018-12-07 DIAGNOSIS — E43 Unspecified severe protein-calorie malnutrition: Secondary | ICD-10-CM

## 2018-12-07 DIAGNOSIS — A4101 Sepsis due to Methicillin susceptible Staphylococcus aureus: Secondary | ICD-10-CM | POA: Diagnosis present

## 2018-12-07 DIAGNOSIS — J869 Pyothorax without fistula: Secondary | ICD-10-CM

## 2018-12-07 DIAGNOSIS — I10 Essential (primary) hypertension: Secondary | ICD-10-CM | POA: Diagnosis present

## 2018-12-07 DIAGNOSIS — J9611 Chronic respiratory failure with hypoxia: Secondary | ICD-10-CM | POA: Diagnosis not present

## 2018-12-07 DIAGNOSIS — J440 Chronic obstructive pulmonary disease with acute lower respiratory infection: Secondary | ICD-10-CM | POA: Diagnosis present

## 2018-12-07 DIAGNOSIS — T451X5A Adverse effect of antineoplastic and immunosuppressive drugs, initial encounter: Secondary | ICD-10-CM | POA: Diagnosis present

## 2018-12-07 DIAGNOSIS — J9621 Acute and chronic respiratory failure with hypoxia: Secondary | ICD-10-CM | POA: Diagnosis present

## 2018-12-07 DIAGNOSIS — J90 Pleural effusion, not elsewhere classified: Secondary | ICD-10-CM | POA: Diagnosis present

## 2018-12-07 DIAGNOSIS — R6520 Severe sepsis without septic shock: Secondary | ICD-10-CM | POA: Diagnosis present

## 2018-12-07 DIAGNOSIS — E872 Acidosis: Secondary | ICD-10-CM | POA: Diagnosis present

## 2018-12-07 DIAGNOSIS — C9000 Multiple myeloma not having achieved remission: Secondary | ICD-10-CM | POA: Diagnosis not present

## 2018-12-07 DIAGNOSIS — J942 Hemothorax: Secondary | ICD-10-CM

## 2018-12-07 DIAGNOSIS — J948 Other specified pleural conditions: Secondary | ICD-10-CM | POA: Diagnosis present

## 2018-12-07 LAB — BASIC METABOLIC PANEL
ANION GAP: 7 (ref 5–15)
BUN: 11 mg/dL (ref 6–20)
CO2: 18 mmol/L — ABNORMAL LOW (ref 22–32)
Calcium: 5.9 mg/dL — CL (ref 8.9–10.3)
Chloride: 114 mmol/L — ABNORMAL HIGH (ref 98–111)
Creatinine, Ser: 0.86 mg/dL (ref 0.61–1.24)
GFR calc Af Amer: >60 mL/min (ref >60)
GFR calc non Af Amer: >60 mL/min (ref >60)
Glucose, Bld: 156 mg/dL — ABNORMAL HIGH (ref 70–99)
Potassium: 3.5 mmol/L (ref 3.5–5.1)
Sodium: 139 mmol/L (ref 135–145)

## 2018-12-07 LAB — CBC
HCT: 34.5 % — ABNORMAL LOW (ref 39.0–52.0)
Hemoglobin: 10.6 g/dL — ABNORMAL LOW (ref 13.0–17.0)
MCH: 25 pg — ABNORMAL LOW (ref 26.0–34.0)
MCHC: 30.7 g/dL (ref 30.0–36.0)
MCV: 81.4 fL (ref 80.0–100.0)
Platelets: 112 10*3/uL — ABNORMAL LOW (ref 150–400)
RBC: 4.24 MIL/uL (ref 4.22–5.81)
RDW: 16.6 % — ABNORMAL HIGH (ref 11.5–15.5)
WBC: 12.1 10*3/uL — AB (ref 4.0–10.5)
nRBC: 0 % (ref 0.0–0.2)

## 2018-12-07 LAB — BLOOD CULTURE ID PANEL (REFLEXED)
Acinetobacter baumannii: NOT DETECTED
CANDIDA GLABRATA: NOT DETECTED
CANDIDA TROPICALIS: NOT DETECTED
Candida albicans: NOT DETECTED
Candida krusei: NOT DETECTED
Candida parapsilosis: NOT DETECTED
Enterobacter cloacae complex: NOT DETECTED
Enterobacteriaceae species: NOT DETECTED
Enterococcus species: NOT DETECTED
Escherichia coli: NOT DETECTED
Haemophilus influenzae: NOT DETECTED
KLEBSIELLA PNEUMONIAE: NOT DETECTED
Klebsiella oxytoca: NOT DETECTED
Listeria monocytogenes: NOT DETECTED
Methicillin resistance: NOT DETECTED
NEISSERIA MENINGITIDIS: NOT DETECTED
Proteus species: NOT DETECTED
Pseudomonas aeruginosa: NOT DETECTED
STAPHYLOCOCCUS SPECIES: DETECTED — AB
Serratia marcescens: NOT DETECTED
Staphylococcus aureus (BCID): DETECTED — AB
Streptococcus agalactiae: NOT DETECTED
Streptococcus pneumoniae: NOT DETECTED
Streptococcus pyogenes: NOT DETECTED
Streptococcus species: NOT DETECTED

## 2018-12-07 LAB — PROCALCITONIN: PROCALCITONIN: 3.89 ng/mL

## 2018-12-07 MED ORDER — LEVALBUTEROL HCL 0.63 MG/3ML IN NEBU
0.6300 mg | INHALATION_SOLUTION | RESPIRATORY_TRACT | Status: DC | PRN
  Administered 2018-12-08: 0.63 mg via RESPIRATORY_TRACT
  Filled 2018-12-07: qty 3

## 2018-12-07 MED ORDER — LEVALBUTEROL HCL 1.25 MG/0.5ML IN NEBU
1.2500 mg | INHALATION_SOLUTION | Freq: Four times a day (QID) | RESPIRATORY_TRACT | Status: DC
  Administered 2018-12-07 – 2018-12-09 (×8): 1.25 mg via RESPIRATORY_TRACT
  Filled 2018-12-07 (×8): qty 0.5

## 2018-12-07 MED ORDER — ADULT MULTIVITAMIN W/MINERALS CH
1.0000 | ORAL_TABLET | Freq: Every day | ORAL | Status: DC
  Administered 2018-12-07 – 2018-12-08 (×2): 1 via ORAL
  Filled 2018-12-07 (×2): qty 1

## 2018-12-07 MED ORDER — LABETALOL HCL 5 MG/ML IV SOLN
5.0000 mg | INTRAVENOUS | Status: DC | PRN
  Administered 2018-12-08 – 2018-12-12 (×2): 5 mg via INTRAVENOUS
  Filled 2018-12-07 (×3): qty 4

## 2018-12-07 MED ORDER — METOPROLOL TARTRATE 12.5 MG HALF TABLET: 12.5000 mg | ORAL_TABLET | Freq: Two times a day (BID) | ORAL | Status: DC

## 2018-12-07 MED ORDER — CEFAZOLIN SODIUM-DEXTROSE 2-4 GM/100ML-% IV SOLN
2.0000 g | Freq: Three times a day (TID) | INTRAVENOUS | Status: DC
  Administered 2018-12-07 – 2018-12-19 (×35): 2 g via INTRAVENOUS
  Filled 2018-12-07 (×45): qty 100

## 2018-12-07 MED ORDER — SODIUM CHLORIDE 0.9 % IV BOLUS
1000.0000 mL | Freq: Once | INTRAVENOUS | Status: AC
  Administered 2018-12-07: 1000 mL via INTRAVENOUS

## 2018-12-07 MED ORDER — LEVALBUTEROL HCL 1.25 MG/0.5ML IN NEBU
1.2500 mg | INHALATION_SOLUTION | Freq: Four times a day (QID) | RESPIRATORY_TRACT | Status: DC | PRN
  Administered 2018-12-07: 1.25 mg via RESPIRATORY_TRACT
  Filled 2018-12-07: qty 0.5

## 2018-12-07 MED ORDER — METOPROLOL TARTRATE 12.5 MG HALF TABLET
12.5000 mg | ORAL_TABLET | Freq: Two times a day (BID) | ORAL | Status: DC
  Administered 2018-12-07: 12.5 mg via ORAL
  Filled 2018-12-07 (×2): qty 1

## 2018-12-07 MED ORDER — GUAIFENESIN 100 MG/5ML PO SOLN
5.0000 mL | ORAL | Status: DC | PRN
  Administered 2018-12-07 – 2018-12-08 (×2): 100 mg via ORAL
  Filled 2018-12-07 (×2): qty 10

## 2018-12-07 MED ORDER — CALCIUM GLUCONATE-NACL 1-0.675 GM/50ML-% IV SOLN
1.0000 g | Freq: Once | INTRAVENOUS | Status: AC
  Administered 2018-12-07: 1000 mg via INTRAVENOUS
  Filled 2018-12-07: qty 50

## 2018-12-07 MED ORDER — SODIUM CHLORIDE 0.9 % IV BOLUS
250.0000 mL | Freq: Once | INTRAVENOUS | Status: AC
  Administered 2018-12-07: 250 mL via INTRAVENOUS

## 2018-12-07 NOTE — Progress Notes (Signed)
PHARMACY - PHYSICIAN COMMUNICATION CRITICAL VALUE ALERT - BLOOD CULTURE IDENTIFICATION (BCID)  [name] [name] is an 58 y.o. male who presented to [facility] on 12/06/2018 with a chief complaint of sepsis with suspected PNA  Assessment:  BCID 1 of 4 bottles (anaerobic bottle only) Staph sp & Staph aureus, likely contaminant  Name of physician (or Provider) Contacted: Dr. Wyline Copas  Current antibiotics: Cefepime 1gm q12  Changes to prescribed antibiotics recommended: no treatment needed Patient is on recommended antibiotics - No changes needed  Results for orders placed or performed during the hospital encounter of 12/06/18  Blood Culture ID Panel (Reflexed) (Collected: 12/06/2018 11:53 AM)  Result Value Ref Range   Enterococcus species NOT DETECTED NOT DETECTED   Listeria monocytogenes NOT DETECTED NOT DETECTED   Staphylococcus species DETECTED (A) NOT DETECTED   Staphylococcus aureus (BCID) DETECTED (A) NOT DETECTED   Methicillin resistance NOT DETECTED NOT DETECTED   Streptococcus species NOT DETECTED NOT DETECTED   Streptococcus agalactiae NOT DETECTED NOT DETECTED   Streptococcus pneumoniae NOT DETECTED NOT DETECTED   Streptococcus pyogenes NOT DETECTED NOT DETECTED   Acinetobacter baumannii NOT DETECTED NOT DETECTED   Enterobacteriaceae species NOT DETECTED NOT DETECTED   Enterobacter cloacae complex NOT DETECTED NOT DETECTED   Escherichia coli NOT DETECTED NOT DETECTED   Klebsiella oxytoca NOT DETECTED NOT DETECTED   Klebsiella pneumoniae NOT DETECTED NOT DETECTED   Proteus species NOT DETECTED NOT DETECTED   Serratia marcescens NOT DETECTED NOT DETECTED   Haemophilus influenzae NOT DETECTED NOT DETECTED   Neisseria meningitidis NOT DETECTED NOT DETECTED   Pseudomonas aeruginosa NOT DETECTED NOT DETECTED   Candida albicans NOT DETECTED NOT DETECTED   Candida glabrata NOT DETECTED NOT DETECTED   Candida krusei NOT DETECTED NOT DETECTED   Candida parapsilosis NOT DETECTED NOT DETECTED   Candida tropicalis NOT DETECTED NOT DETECTED    Nyoka Cowden, Hollyn Stucky L 12/07/2018  4:10 PM

## 2018-12-07 NOTE — Progress Notes (Signed)
Pharmacy Antibiotic Note  Bashir Marchetti is a 58 y.o. male admitted on 12/06/2018 with sepsis with suspected pneumonia.  PMH significant for multiple myeloma, pulmonary HTN, COPD.  Presents with c/o fever, worsening cough and hemoptysis.  Pharmacy has been consulted for Vancomycin and Cefepime dosing.  Plan:  Discontinued Vancomycin, Cefepime  Begin Ancef 2gm IV q8 for bacteremia   Height: 5' (152.4 cm) Weight: 81 lb 5.6 oz (36.9 kg) IBW/kg (Calculated) : 50  Temp (24hrs), Avg:97.9 F (36.6 C), Min:97.5 F (36.4 C), Max:98.1 F (36.7 C)  Recent Labs  Lab 12/02/18 1418 12/06/18 1041 12/06/18 1153 12/06/18 1434 12/07/18 0343  WBC 5.7 16.1*  --   --  12.1*  CREATININE 0.96 1.06  --   --  0.86  LATICACIDVEN  --   --  4.0* 3.2*  --     Estimated Creatinine Clearance: 50.1 mL/min (by C-G formula based on SCr of 0.86 mg/dL).    No Known Allergies  Antimicrobials this admission: PTA Acyclovir >>  2/2 vanc >>  2/3 2/2 cefepime >> 2/3 2/3 Cefazolin >>  Dose adjustments this admission:   Microbiology results: 2/2 BCx: BCID 1/4 Staph aureus, Staph sp, no resistance noted Sputum: ordered, not collected 2/2 MRSA PCR: negative 2/2 Influenza a/b: neg/neg  Thank you for allowing pharmacy to be a part of this patient's care.  Minda Ditto, PharmD 12/07/2018 6:28 PM

## 2018-12-07 NOTE — Progress Notes (Signed)
Pt found off BIPAP, RN removed at approximately 0300 and placed back on 3 LPM [place].  Pt in no noted distress at this time, RT to monitor and assess as needed.

## 2018-12-07 NOTE — Progress Notes (Signed)
CRITICAL VALUE ALERT  Critical Value:  Calcium 5.9  Date & Time Notied: 0514 12/07/2018  Provider Notified: Alger Memos  Orders Received/Actions taken: Awaiting new orders

## 2018-12-07 NOTE — Care Management Note (Signed)
Case Management Note  Patient Details  Name: [name] [name] MRN: [id number] Date of Birth: 18-Feb-1961  Subjective/Objective:                  Discharge Readiness Return to top of Chronic Obstructive Pulmonary Disease RRG - [place]  Discharge readiness is indicated by patient meeting Recovery Milestones, including ALL of the following: ? Hemodynamic stability  TEMP=100.4,HR-127, RR=42, ? Mental status at baseline YES ? No evidence of infection, or outpatient treatment planned BLOOD CULTURES=PENDING/WBC-12.1 ? Eating and sleeping without frequent dyspnea NO ? Breathing comfortably at rest NO RESP 42 AT REST ? Ambulatory NO ? Oral hydration, medications, and diet ? IV AMIODARONE, IV MAXIPIME,IV NS AT 75CC/HR ? LEVEL OF CARE SDU   Action/Plan: Following for progression of care. Following for cm needs none present at this time.  Expected Discharge Date:  12/08/18               Expected Discharge Plan:     In-House Referral:     Discharge planning Services     Post Acute Care Choice:    Choice offered to:     DME Arranged:    DME Agency:     HH Arranged:    HH Agency:     Status of Service:     If discussed at [employer], dates discussed:    Additional Comments:  Leeroy Cha, RN 12/07/2018, 10:55 AM

## 2018-12-07 NOTE — Consult Note (Signed)
[facility] for Infectious Disease    Date of Admission:  12/06/2018           Day 2 cefepime       Reason for Consult: Automatic consultation for MSSA bacteremia     Assessment: He has pneumonia complicated by transient MSSA bacteremia.  I will narrow antibiotic therapy to cefazolin.  I will order repeat blood cultures and TTE.  Plan: 1. Change cefepime to cefazolin 2. Repeat blood cultures 3. Transthoracic echocardiogram  Principal Problem:   Bacteremia due to methicillin susceptible Staphylococcus aureus (MSSA) Active Problems:   Community acquired pneumonia   Pleural effusion, left   COPD GOLD IV criteria but never smoked    Multiple myeloma (HCC)   Acute on chronic respiratory failure with hypoxia (HCC)   Grade II diastolic dysfunction   Protein-calorie malnutrition, severe   Sepsis ([place])   Alpha-1-antitrypsin deficiency (HCC)   Bronchiectasis ([place])   Scheduled Meds: . acyclovir  400 mg Oral Daily  . aspirin EC  81 mg Oral Daily  . docusate sodium  50 mg Oral QHS  . enoxaparin (LOVENOX) injection  30 mg Subcutaneous Q24H  . feeding supplement (ENSURE ENLIVE)  237 mL Oral BID BM  . fluticasone furoate-vilanterol  1 puff Inhalation Daily   And  . umeclidinium bromide  1 puff Inhalation Daily  . levalbuterol  1.25 mg Nebulization Q6H  . methylPREDNISolone (SOLU-MEDROL) injection  40 mg Intravenous Q12H  . metoprolol tartrate  12.5 mg Oral BID  . multivitamin with minerals  1 tablet Oral Daily  . pantoprazole  40 mg Oral Daily   Continuous Infusions: . sodium chloride 75 mL/hr at 12/07/18 1715  . amiodarone Stopped (12/07/18 1645)  . ceFEPime (MAXIPIME) IV Stopped (12/07/18 1340)   PRN Meds:.acetaminophen **OR** acetaminophen, guaiFENesin, labetalol, levalbuterol, ondansetron **OR** ondansetron (ZOFRAN) IV, oxyCODONE-acetaminophen, senna-docusate  HPI: [name] [name] is a 58 y.o. male with a history of bronchiectasis, alpha-1 antitrypsin deficiency and  polymicrobial left pleural empyema in 2016.  He underwent VATS decortication and was left with a chronic left hydropneumothorax.  He has chronic respiratory failure and hypoxia.  He is also currently being treated for multiple myeloma.  He was admitted yesterday with recent onset of fever, cough, shortness of breath and hemoptysis.  Chest x-ray and CT scan show new right lower lobe infiltrates.  1 of 2 admission blood cultures has grown MSSA.   Review of Systems: Review of Systems  Constitutional: Negative for fever.  Respiratory: Positive for cough, hemoptysis, sputum production and shortness of breath.     Past Medical History:  Diagnosis Date  . Acute on chronic respiratory failure ([place]) 02/07/2018  . Hypertension   . Kidney stones   . Multiple myeloma ([place])   . Obstructive lung disease (generalized) ([place])   . Pulmonary hypertension (HCC)     Social History   Tobacco Use  . Smoking status: Never Smoker  . Smokeless tobacco: Never Used  Substance Use Topics  . Alcohol use: Yes  . Drug use: No    Family History  Problem Relation Age of Onset  . Hypertension Other    No Known Allergies  OBJECTIVE: Blood pressure 117/74, pulse 70, temperature 98 F (36.7 C), temperature source Oral, resp. rate (!) 27, height 5' (1.524 m), weight 36.9 kg, SpO2 100 %.  Physical Exam Constitutional:      Comments: He is visiting with family.  He is in no distress.  He is  cachectic.  Cardiovascular:     Rate and Rhythm: Normal rate and regular rhythm.     Heart sounds: No murmur.  Pulmonary:     Comments: There are crackles in his right base posteriorly.    Lab Results Lab Results  Component Value Date   WBC 12.1 (H) 12/07/2018   HGB 10.6 (L) 12/07/2018   HCT 34.5 (L) 12/07/2018   MCV 81.4 12/07/2018   PLT 112 (L) 12/07/2018    Lab Results  Component Value Date   CREATININE 0.86 12/07/2018   BUN 11 12/07/2018   NA 139 12/07/2018   K 3.5 12/07/2018   CL 114 (H) 12/07/2018    CO2 18 (L) 12/07/2018    Lab Results  Component Value Date   ALT 12 12/02/2018   AST 18 12/02/2018   ALKPHOS 70 12/02/2018   BILITOT 0.3 12/02/2018     Microbiology: Recent Results (from the past 240 hour(s))  Blood culture (routine x 2)     Status: None (Preliminary result)   Collection Time: 12/06/18 11:53 AM  Result Value Ref Range Status   Specimen Description   Final    BLOOD FOREARM Performed at [facility], [place] [address], [place], [place] [address]    Special Requests   Final    BOTTLES DRAWN AEROBIC AND ANAEROBIC Blood Culture adequate volume Performed at [facility] [address], [place], [place] [address]    Culture  Setup Time   Final    GRAM POSITIVE COCCI ANAEROBIC BOTTLE ONLY Organism ID to follow CRITICAL RESULT CALLED TO, READ BACK BY AND VERIFIED WITH: T GREEN PHARMD 1514 12/07/18 A BROWNING Performed at [facility], [place] [address] [place], [place] [address]    Culture PENDING  Incomplete   Report Status PENDING  Incomplete  Blood culture (routine x 2)     Status: None (Preliminary result)   Collection Time: 12/06/18 11:53 AM  Result Value Ref Range Status   Specimen Description   Final    BLOOD RIGHT FOREARM Performed at [facility], [place] [address] [place], [place] [address]    Special Requests   Final    BOTTLES DRAWN AEROBIC AND ANAEROBIC Blood Culture adequate volume Performed at [facility] [address], [place], [place] [address]    Culture   Final    NO GROWTH < 24 HOURS Performed at [facility] [address], [place],  [address]    Report Status PENDING  Incomplete  Blood Culture ID Panel (Reflexed)     Status: Abnormal   Collection Time: 12/06/18 11:53 AM  Result Value Ref Range Status   Enterococcus species NOT DETECTED NOT DETECTED Final   Listeria monocytogenes NOT DETECTED NOT DETECTED Final   Staphylococcus species DETECTED (A) NOT DETECTED Final     Comment: CRITICAL RESULT CALLED TO, READ BACK BY AND VERIFIED WITH: T GREEN PHARMD 1514 12/07/18 A BROWNING    Staphylococcus aureus (BCID) DETECTED (A) NOT DETECTED Final    Comment: Methicillin (oxacillin) susceptible Staphylococcus aureus (MSSA). Preferred therapy is anti staphylococcal beta lactam antibiotic (Cefazolin or Nafcillin), unless clinically contraindicated. CRITICAL RESULT CALLED TO, READ BACK BY AND VERIFIED WITH: T GREEN PHARMD 1514 12/07/18 A BROWNING    Methicillin resistance NOT DETECTED NOT DETECTED Final   Streptococcus species NOT DETECTED NOT DETECTED Final   Streptococcus agalactiae NOT DETECTED NOT DETECTED Final   Streptococcus pneumoniae NOT DETECTED NOT DETECTED Final  Streptococcus pyogenes NOT DETECTED NOT DETECTED Final   Acinetobacter baumannii NOT DETECTED NOT DETECTED Final   Enterobacteriaceae species NOT DETECTED NOT DETECTED Final   Enterobacter cloacae complex NOT DETECTED NOT DETECTED Final   Escherichia coli NOT DETECTED NOT DETECTED Final   Klebsiella oxytoca NOT DETECTED NOT DETECTED Final   Klebsiella pneumoniae NOT DETECTED NOT DETECTED Final   Proteus species NOT DETECTED NOT DETECTED Final   Serratia marcescens NOT DETECTED NOT DETECTED Final   Haemophilus influenzae NOT DETECTED NOT DETECTED Final   Neisseria meningitidis NOT DETECTED NOT DETECTED Final   Pseudomonas aeruginosa NOT DETECTED NOT DETECTED Final   Candida albicans NOT DETECTED NOT DETECTED Final   Candida glabrata NOT DETECTED NOT DETECTED Final   Candida krusei NOT DETECTED NOT DETECTED Final   Candida parapsilosis NOT DETECTED NOT DETECTED Final   Candida tropicalis NOT DETECTED NOT DETECTED Final    Comment: Performed at [facility], [place] [address], [place], [place] [address]  MRSA PCR Screening     Status: None   Collection Time: 12/06/18  5:41 PM  Result Value Ref Range Status   MRSA by PCR NEGATIVE NEGATIVE Final    Comment:        The GeneXpert MRSA Assay  (FDA approved for NASAL specimens only), is one component of a comprehensive MRSA colonization surveillance program. It is not intended to diagnose MRSA infection nor to guide or monitor treatment for MRSA infections. Performed at [facility], [place] [address] [place], [place] [address]     Michel Bickers, [place] [facility] [phone] pager   [phone] cell 12/07/2018, 6:03 PM

## 2018-12-07 NOTE — Progress Notes (Signed)
Initial Nutrition Assessment  DOCUMENTATION CODES:   Severe malnutrition in context of chronic illness, Underweight  INTERVENTION:  - Continue Ensure Enlive BID, each supplement provides 350 kcal and 20 grams of protein. - Will order Magic Cup with dinner meals, each supplement provides 290 kcal and 9 grams of protein. - Will order daily multivitamin with minerals. - Continue to encourage PO intakes.    NUTRITION DIAGNOSIS:   Severe Malnutrition related to chronic illness, cancer and cancer related treatments as evidenced by moderate fat depletion, severe fat depletion, moderate muscle depletion, severe muscle depletion.  GOAL:   Patient will meet greater than or equal to 90% of their needs  MONITOR:   PO intake, Supplement acceptance, Weight trends, Labs  REASON FOR ASSESSMENT:   Malnutrition Screening Tool    ASSESSMENT:   58 year-old male with past medical history significant for multiple myeloma on Revlimid,  pulmonary HTN, chronic hypoxic respiratory failure, chronic L-sided hydropneumothorax, bronchiectasis, allergic bronchopulmonary aspergillosis, and heterozygous alpha 1 antitrypsin deficiency. Patient presented to the ED with SOB, worsening productive cough, and hyemoptysis x3 days PTA. He reported fever with temperature 100 at home. He denies chest dain, nausea, vomiting. Patient was admitted with dx of HCAP; s/p CXR.  No intakes documented since admission. Spoke with RN who reports that patient needs interpretor for in-depth conversations, but is able to speak [place] for simpler conversations. No family/visitors present at the time of RD visit.  Patient reports poor appetite which has been ongoing for "a long time". He states that he has been experiencing taste alteration (unable to specify other than "bad") and that he mainly eats bites of meals and then has to throw the rest of the food away. He reports feeling very unwell today and being unable to think about  eating, but that he has been sipping on liquids, including Ensure, throughout the morning.   Per chart review, current weight is 81 lb and it appears that weight has been mainly stable x3 months. Patient was seen by another RD on 11/1 and by [facility] RD on 11/21. Notes indicate that last treatment for multiple myeloma was on 1/30.   Medications reviewed; 1 mg IV Ca gluconate x1 run 2/3, 50 mg colace/day, 40 mg solu-medrol BID, 40 mEq oral KCl x1 dose 2/2. Labs reviewed; Cl: 114 mmol/l, Ca: 5.9 mg/dl.  IVF; NS @ 75 ml/hr.     NUTRITION - FOCUSED PHYSICAL EXAM:    Most Recent Value  Orbital Region  Moderate depletion  Upper Arm Region  Severe depletion  Thoracic and Lumbar Region  Unable to assess  Buccal Region  Severe depletion  Temple Region  Moderate depletion  Clavicle Bone Region  Severe depletion  Clavicle and Acromion Bone Region  Severe depletion  Scapular Bone Region  Unable to assess  Dorsal Hand  Severe depletion  Patellar Region  Severe depletion  Anterior Thigh Region  Unable to assess  Posterior Calf Region  Severe depletion  Edema (RD Assessment)  None  Hair  Reviewed  Eyes  Reviewed  Mouth  Reviewed  Skin  Reviewed  Nails  Reviewed       Diet Order:   Diet Order            Diet full liquid Room service appropriate? Yes; Fluid consistency: Thin  Diet effective now              EDUCATION NEEDS:   No education needs have been identified at this time  Skin:  Skin  Assessment: Reviewed RN Assessment  Last BM:  2/1  Height:   Ht Readings from Last 1 Encounters:  12/06/18 5' (1.524 m)    Weight:   Wt Readings from Last 1 Encounters:  12/06/18 36.9 kg    Ideal Body Weight:  48.18 kg  BMI:  Body mass index is 15.89 kg/m.  Estimated Nutritional Needs:   Kcal:  1480-1660 kcal  Protein:  66-81 grams  Fluid:  >/= 1.6 L/day      Jarome Matin, MS, RD, LDN, [facility] Inpatient Clinical Dietitian Pager # [phone] After hours/weekend pager #  [phone]

## 2018-12-07 NOTE — Progress Notes (Signed)
PROGRESS NOTE    [name] [name]  IWL:[id number] DOB: [birth year] DOA: 12/06/2018 PCP: [name], PA-C    Brief Narrative:  58 y.o. male with past medical history significant for multiple myeloma on Revlimid,  pulmonary hypertension, chronic hypoxic respiratory failure, chronic left-sided hydropneumothorax, bronchiectasis, allergic bronchopulmonary aspergillosis, heterozygous alpha 1 antitrypsin deficiency, originally was treated for empyema with VATS decortication in 2016 who presents complaining of worsening shortness of breath that is started 3 days prior to admission.  He also report worsening productive cough, hemoptysis over the last 3 days.  He reports fever with temperature 100 at home. He denies chest pain, nausea, vomiting.  Evaluation in the ED: Sodium 130, potassium 3.4, chloride 94, calcium 7.5, lactic acid at 4, white blood cell 16, hemoglobin 12, platelets 142. Chest x-ray:Chronic lower lobe opacities, likely scarring/atelectasis, although superimposed right lower lobe pneumonia is not excluded. Moderate left and small right pleural effusions, chronic. Associated calcified pleural plaques, suggesting asbestos related pleural disease.  Review of Systems: All systems reviewed and apart from history of presenting illness, are negative.  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  Sputum culture, blood cultures ordered.  UA ordered. -Initially continued on IV vancomycin, IV cefepime. -continued IV fluid  hydration -MRSA swab neg, thus vanc d/c'd  2-Acute on chronic hypoxic respiratory failure:  -History of chronic left hydropneumothorax, bronchiectasis, allergic pulmonary aspergillosis; COPD Gold 4 - continue abx with steroids and bronchodilator as tolerated -Schedule nebulizer treatment, IV Solu-Medrol. -BiPAP was ordered -CCM was consulted.  3-Multiple myeloma: Patient to follow with Dr. Linwood Dibbles, added to rounding team. - Revlimid on hold for now.   4-Hypokalemia: Replaced  5 Hyponatremia: Improved with IVF hydration  6. Hypocalcemia - Replaced - Repeat lytes in AM  7. New Rapid afib -Rapid fib noted overnight, now on amio gtt -Now in sinus -Hydrate to allow for BP to tolerate beta blocker and wean off amio -Suspect provoked afib from acute illness. CHADS-VASc of 1. Pt already on ASA prior to admit  DVT prophylaxis: Lovenox subq Code Status: Full Family Communication: Pt in room, family not at bedside Disposition Plan: Uncertain at this time  Consultants:   CCM  Procedures:     Antimicrobials: Anti-infectives (From admission, onward)   Start     Dose/Rate Route Frequency Ordered Stop   12/08/18 0100  vancomycin (VANCOCIN) IVPB 750 mg/150 ml premix     750 mg 150 mL/hr over 60 Minutes Intravenous Every 36 hours 12/06/18 1441     12/07/18 0100  ceFEPIme (MAXIPIME) 1 g in sodium chloride 0.9 % 100 mL IVPB     1 g 200 mL/hr over 30 Minutes Intravenous Every 12 hours 12/06/18 1441     12/06/18 1830  acyclovir (ZOVIRAX) tablet 400 mg     400 mg Oral Daily 12/06/18 1757     12/06/18 1145  vancomycin (VANCOCIN) IVPB 750 mg/150 ml premix     750 mg 150 mL/hr over 60 Minutes Intravenous STAT 12/06/18 1136 12/06/18 1402   12/06/18 1130  ceFEPIme (MAXIPIME) 1  g in sodium chloride 0.9 % 100 mL IVPB     1 g 200 mL/hr over 30 Minutes Intravenous  Once 12/06/18 1129 12/06/18 1256       Subjective: Reports feeling better  Objective: Vitals:   12/07/18 0630  12/07/18 0700 12/07/18 0730 12/07/18 0800  BP: (!) 88/58 (!) 80/63 96/70 100/76  Pulse: 85 97    Resp: (!) 27 (!) 26 (!) 31 (!) 25  Temp:      TempSrc:      SpO2: 100% 100%    Weight:      Height:        Intake/Output Summary (Last 24 hours) at 12/07/2018 0832 Last data filed at 12/07/2018 0539 Gross per 24 hour  Intake 5853.35 ml  Output 375 ml  Net 5478.35 ml   Filed Weights   12/06/18 1722  Weight: 36.9 kg    Examination:  General exam: Appears calm and comfortable  Respiratory system: Clear to auscultation. Increased resp effort. Cardiovascular system: S1 & S2 heard, RRR Gastrointestinal system: Abdomen is nondistended, soft and nontender. No organomegaly or masses felt. Normal bowel sounds heard. Central nervous system: Alert and oriented. No focal neurological deficits. Extremities: Symmetric 5 x 5 power. Skin: No rashes, lesions Psychiatry: Judgement and insight appear normal. Mood & affect appropriate.   Data Reviewed: I have personally reviewed following labs and imaging studies  CBC: Recent Labs  Lab 12/02/18 1418 12/06/18 1041 12/07/18 0343  WBC 5.7 16.1* 12.1*  NEUTROABS 4.1  --   --   HGB 11.5* 12.6* 10.6*  HCT 36.7* 39.0 34.5*  MCV 79.4* 77.1* 81.4  PLT 326 142* 035*   Basic Metabolic Panel: Recent Labs  Lab 12/02/18 1418 12/06/18 1041 12/07/18 0343  NA 141 130* 139  K 3.8 3.4* 3.5  CL 102 94* 114*  CO2 30 23 18*  GLUCOSE 90 162* 156*  BUN '14 13 11  '$ CREATININE 0.96 1.06 0.86  CALCIUM 8.8* 7.5* 5.9*   GFR: Estimated Creatinine Clearance: 50.1 mL/min (by C-G formula based on SCr of 0.86 mg/dL). Liver Function Tests: Recent Labs  Lab 12/02/18 1418  AST 18  ALT 12  ALKPHOS 70  BILITOT 0.3  PROT 7.4  ALBUMIN 3.4*   No results for input(s): LIPASE, AMYLASE in the last 168 hours. No results for input(s): AMMONIA in the last 168 hours. Coagulation Profile: No results for input(s): INR, PROTIME in the last 168 hours. Cardiac  Enzymes: No results for input(s): CKTOTAL, CKMB, CKMBINDEX, TROPONINI in the last 168 hours. BNP (last 3 results) No results for input(s): PROBNP in the last 8760 hours. HbA1C: No results for input(s): HGBA1C in the last 72 hours. CBG: No results for input(s): GLUCAP in the last 168 hours. Lipid Profile: No results for input(s): CHOL, HDL, LDLCALC, TRIG, CHOLHDL, LDLDIRECT in the last 72 hours. Thyroid Function Tests: No results for input(s): TSH, T4TOTAL, FREET4, T3FREE, THYROIDAB in the last 72 hours. Anemia Panel: No results for input(s): VITAMINB12, FOLATE, FERRITIN, TIBC, IRON, RETICCTPCT in the last 72 hours. Sepsis Labs: Recent Labs  Lab 12/06/18 1153 12/06/18 1434  LATICACIDVEN 4.0* 3.2*    Recent Results (from the past 240 hour(s))  Blood culture (routine x 2)     Status: None (Preliminary result)   Collection Time: 12/06/18 11:53 AM  Result Value Ref Range Status   Specimen Description   Final    BLOOD FATTY CASTS Performed at [facility] [address] [place], [place] [address]  Special Requests   Final    BOTTLES DRAWN AEROBIC AND ANAEROBIC Blood Culture adequate volume Performed at [facility] [address], [place], [place] [address]    Culture   Final    NO GROWTH < 12 HOURS Performed at [facility] [address], [place], [place] [address]    Report Status PENDING  Incomplete  Blood culture (routine x 2)     Status: None (Preliminary result)   Collection Time: 12/06/18 11:53 AM  Result Value Ref Range Status   Specimen Description   Final    BLOOD RIGHT FOREARM Performed at [facility], [place] [address] [place], [place] [address]    Special Requests   Final    BOTTLES DRAWN AEROBIC AND ANAEROBIC Blood Culture adequate volume Performed at [facility] [address], [place], [place] [address]    Culture   Final    NO GROWTH < 12 HOURS Performed at [facility] [address], [place], [place] [address]    Report Status PENDING  Incomplete  MRSA PCR Screening     Status: None   Collection Time: 12/06/18  5:41 PM  Result Value Ref Range Status   MRSA by PCR NEGATIVE NEGATIVE Final    Comment:        The GeneXpert MRSA Assay (FDA approved for NASAL specimens only), is one component of a comprehensive MRSA colonization surveillance program. It is not intended to diagnose MRSA infection nor to guide or monitor treatment for MRSA infections. Performed at [facility], [place] [address] [place], [place] [address]      Radiology Studies: Dg Chest 2 View  Result Date: 12/06/2018 CLINICAL DATA:  Chest pain, shortness of breath EXAM: CHEST - 2 VIEW COMPARISON:  CTA chest dated 09/03/2018. FINDINGS: Bilateral lower lobe opacities, chronic, likely scarring/atelectasis. However, when correlating with priors, superimposed right lower lobe pneumonia is not excluded. Moderate left and small right pleural effusions, partially loculated on the left, chronic when correlating with CT. Calcified pleural plaques. The heart is normal in size. Pulmonary arterial hypertension. Pulmonary vascular congestion without frank interstitial edema. No pneumothorax IMPRESSION: Chronic lower lobe opacities, likely scarring/atelectasis, although superimposed right lower lobe pneumonia is not excluded. Moderate left and small right pleural effusions, chronic. Associated calcified pleural plaques, suggesting asbestos related pleural disease. Electronically Signed   By: Julian Hy M.D.   On: 12/06/2018 11:39   Ct Chest Wo Contrast  Result Date: 12/06/2018 CLINICAL DATA:  H/O multiple myeloma on Revlimid, pulmonary hypertension, chronic hypoxic respiratory failure, chronic left-sided hydropneumothorax, bronchiectasis, allergic bronchopulmonary aspergillosis, heterozygous alpha 1 antitrypsin deficiency, originally was treated for empyema with VATS decortication in  2016 Today presents with worsening shortness of breath that is started 3 days prior to admission. He also report worsening productive cough, hemoptysis over the last 3 days. EXAM: CT CHEST WITHOUT CONTRAST TECHNIQUE: Multidetector CT imaging of the chest was performed following the standard protocol without IV contrast. COMPARISON:  Current chest radiograph.  Prior chest CT, 09/03/2018. FINDINGS: Cardiovascular: Heart is normal in size. No significant pericardial effusion. No coronary artery calcifications. Aorta is normal in caliber.  Mild aortic atherosclerosis. Pulmonary arteries are enlarged. Main pulmonary artery measures 4.7 cm, right pulmonary artery 3.2 cm in left pulmonary artery 3 cm. Mediastinum/Nodes: No neck base or axillary masses or enlarged lymph nodes. No mediastinal or hilar masses or pathologically enlarged lymph nodes. Trachea and esophagus are unremarkable. Lungs/Pleura: There  is extensive consolidation throughout the right lower lobe new since the prior CT. Loculated left pleural effusion with pleural calcifications. There is pleural space air, containing tissue along the posterolateral mid to upper left hemithorax. These findings are without significant change from the prior chest CT. Linear and coarse reticular opacities noted in the peripheral left upper lower lobes, right middle lobe and base of the right upper lobe consistent with scarring/atelectasis, similar to the prior study. Minimal right pleural effusion. Pleural base calcifications on the right stable from the prior study. No evidence of pulmonary edema.  No pneumothorax. Upper Abdomen: No acute findings. Musculoskeletal: No chest wall mass or suspicious bone lesions identified. IMPRESSION: 1. Extensive consolidation throughout the right lower lobe, new since the prior exam, consistent with pneumonia. 2. Chronic changes as detailed above, stable from the prior exam, including loculated left pleural fluid, a loculated area of left  hemithorax pleural air containing tissue and surrounded by pleural based calcifications, areas of reticular scarring and/or atelectasis and right-sided pleural base calcifications. 3. There is also significant enlargement of the pulmonary arteries consistent with pulmonary hypertension. 4. Mild aortic atherosclerosis. Aortic Atherosclerosis (ICD10-I70.0). Electronically Signed   By: Lajean Manes M.D.   On: 12/06/2018 15:55    Scheduled Meds: . acyclovir  400 mg Oral Daily  . aspirin EC  81 mg Oral Daily  . docusate sodium  50 mg Oral QHS  . enoxaparin (LOVENOX) injection  30 mg Subcutaneous Q24H  . feeding supplement (ENSURE ENLIVE)  237 mL Oral BID BM  . fluticasone furoate-vilanterol  1 puff Inhalation Daily   And  . umeclidinium bromide  1 puff Inhalation Daily  . levalbuterol  1.25 mg Nebulization Q6H  . methylPREDNISolone (SOLU-MEDROL) injection  40 mg Intravenous Q12H  . pantoprazole  40 mg Oral Daily   Continuous Infusions: . sodium chloride Stopped (12/07/18 0535)  . amiodarone 30 mg/hr (12/07/18 0621)  . ceFEPime (MAXIPIME) IV 983 mL/hr at 12/07/18 0203  . [START ON 12/08/2018] vancomycin       LOS: 0 days   Marylu Lund, MD [facility] Pager On Amion  If 7PM-7AM, please contact night-coverage 12/07/2018, 8:32 AM

## 2018-12-07 NOTE — Progress Notes (Signed)
NAME:  [name] [name], MRN:  [id number], DOB:  [birth year], LOS: 0 ADMISSION DATE:  12/06/2018, CONSULTATION DATE: 12/06/2018 REFERRING MD: Dr. 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  Admitted with dyspnea, cough, hemoptysis and chest x-ray evidence for R HCAP  History of present illness   58 year old never smoker with multiple myeloma being treated with Revlimid, Dexamethasone, and Velcade.  Followed in our office by Dr. 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  Also secondary pulmonary hypertension in the setting of all the above.  Managed on Trelegy, oxygen.   He is here with progressive cough, blood-tinged sputum, low-grade fever that began about 3 days prior to admission.  His last myeloma treatment was last Thursday.  Chest x-ray shows possible progression of right lower lobe airspace disease superimposed on his chronic changes.  He has been tachypneic and tachycardic, has an elevated lactate = 4.    Past Medical History   Past Medical History:  Diagnosis Date  . Acute on chronic respiratory failure ([place]) 02/07/2018  . Hypertension   . Kidney stones   . Multiple myeloma ([place])   . Obstructive lung disease (generalized) ([place])   . Pulmonary hypertension ([place])     Significant Hospital Events     Consults:    Procedures:    Significant Diagnostic Tests:  CT chest 2/2 >>   Micro Data:  Resp 2/2 >>  Blood 2/2 >>   Antimicrobials:  Vanco 2/2 >>  Cefepime 2/2 >>    Interim history/subjective:  Continues to c/o cough productive of yellow sputum No events overnight and no new  complaints  Objective   Blood pressure 100/76, pulse 97, temperature 97.8 F (36.6 C), temperature source Oral, resp. rate (!) 25, height 5' (1.524 m), weight 36.9 kg, SpO2 100 %.        Intake/Output Summary (Last 24 hours) at 12/07/2018 1018 Last data filed at 12/07/2018 0539 Gross per 24 hour  Intake 5853.35 ml  Output 375 ml  Net 5478.35 ml   Filed Weights   12/06/18 1722  Weight: 36.9 kg   Examination: General: Chronically ill appearing male, emaciated, NAD HENT: [place]/AT, PERRL, EOM-I and MMM Lungs: RLL crackles, decreased on the left Cardiovascular: IRIR, Nl S1/S2 and -M/R/G Abdomen: Soft, NT, ND and +BS Extremities: -edema and -tenderness Neuro: Awake and interactive, moving all ext to commands   I reviewed chest CT myself, chronic hydropneumo noted with RLL infiltrate  Resolved Hospital Problem list   Discussed with PCCM-NP  Assessment & Plan:   Acute on chronic hypoxemic respiratory failure with increased work of breathing - On 3L  at home, continue current dose - D/C BiPAP, comfortable on exam, this AM  Right lower lobe healthcare associated pneumonia.  Probably bacterial and due to his known bronchiectasis, immunosuppression due to chemotherapy. Must consider the possibility of invasive aspergillosis given his colonization.  I think this will need to be evaluated with further imaging. - Continue cefepime for now - F/U on culture - Flu negative - May consider ID consult but would not recommend anti-fungals for now, this is likely a bacterial pneumonia given configuration on CT  Severe sepsis, lactate 4 as  his volume resuscitation is ongoing - Continue volume resuscitation - F/U on cultures - Antibiotics as above - Procal pending, will re-order  Bronchiectasis and COPD (alpha-1 antitrypsin deficient) with acute exacerbation - Continue solumedrol - Bronchodilators ordered - F/U alpha 1 as outpatient - Pulmonary hygienes  Left hydropneumothorax, remote VATS  decortication for empyema - No need for change in management at this point  Multiple myeloma - Agree with holding Revlimid for now - Acyclovir ordered  PCCM will continue to follow  Best practice:  Diet: Regular Pain/Anxiety/Delirium protocol (if indicated): N/A VAP protocol (if indicated): N/A DVT prophylaxis: Heparin GI prophylaxis: N/A Glucose control: NA Mobility: Out of bed ad lib. Code Status: Full code Family Communication: No family available 2/2 Disposition: SDU  Labs   CBC: Recent Labs  Lab 12/02/18 1418 12/06/18 1041 12/07/18 0343  WBC 5.7 16.1* 12.1*  NEUTROABS 4.1  --   --   HGB 11.5* 12.6* 10.6*  HCT 36.7* 39.0 34.5*  MCV 79.4* 77.1* 81.4  PLT 326 142* 112*    Basic Metabolic Panel: Recent Labs  Lab 12/02/18 1418 12/06/18 1041 12/07/18 0343  NA 141 130* 139  K 3.8 3.4* 3.5  CL 102 94* 114*  CO2 30 23 18*  GLUCOSE 90 162* 156*  BUN '14 13 11  ' CREATININE 0.96 1.06 0.86  CALCIUM 8.8* 7.5* 5.9*   GFR: Estimated Creatinine Clearance: 50.1 mL/min (by C-G formula based on SCr of 0.86 mg/dL). Recent Labs  Lab 12/02/18 1418 12/06/18 1041 12/06/18 1153 12/06/18 1434 12/07/18 0343  WBC 5.7 16.1*  --   --  12.1*  LATICACIDVEN  --   --  4.0* 3.2*  --     Liver Function Tests: Recent Labs  Lab 12/02/18 1418  AST 18  ALT 12  ALKPHOS 70  BILITOT 0.3  PROT 7.4  ALBUMIN 3.4*   No results for input(s): LIPASE, AMYLASE in the last 168 hours. No results for input(s): AMMONIA in the last 168 hours.  ABG    Component Value Date/Time   PHART 7.446 02/08/2018 0539   PCO2ART 43.9 02/08/2018 0539   PO2ART 68.0 (L) 02/08/2018 0539   HCO3 29.9 (H) 02/08/2018 0539   TCO2 32 06/24/2016 0902   O2SAT 94.3 02/08/2018 0539     Coagulation Profile: No results for input(s): INR, PROTIME in the last 168 hours.  Cardiac Enzymes: No results for input(s): CKTOTAL, CKMB, CKMBINDEX, TROPONINI in the last 168 hours.  HbA1C: No results found for:  HGBA1C  CBG: No results for input(s): GLUCAP in the last 168 hours.  Review of Systems:   Some nausea, poor p.o. intake, emesis for 2 to 3 days Cough with mid chest discomfort that is somewhat musculoskeletal, productive of yellow-tinged with blood Low-grade fever No diarrhea  Past Medical History  He,  has a past medical history of Acute on chronic respiratory failure ([place]) (02/07/2018), Hypertension, Kidney stones, Multiple myeloma ([place]), Obstructive lung disease (generalized) ([place]), and Pulmonary hypertension ([place]).   Surgical History    Past Surgical History:  Procedure Laterality Date  . CARDIAC CATHETERIZATION N/A 01/18/2016   Procedure: Right Heart Cath;  Surgeon: Jolaine Artist, MD;  Location: [facility];  Service: Cardiovascular;  Laterality: N/A;  . CARDIAC CATHETERIZATION N/A 06/24/2016   Procedure: Right Heart Cath;  Surgeon: Jolaine Artist, MD;  Location: [facility];  Service: Cardiovascular;  Laterality: N/A;  . IR RADIOLOGIST EVAL & MGMT  05/27/2018  . IR RADIOLOGIST EVAL & MGMT  09/01/2018  . KIDNEY SURGERY    . VIDEO ASSISTED THORACOSCOPY (VATS)/DECORTICATION Left 2003  . VIDEO ASSISTED THORACOSCOPY (VATS)/DECORTICATION Left 10/02/2015   Procedure: VIDEO ASSISTED THORACOSCOPY (VATS)/DECORTICATION and drainage of chronic empyena;  Surgeon: [name], MD;  Location: [facility];  Service: Thoracic;  Laterality: Left;  Marland Kitchen VIDEO BRONCHOSCOPY N/A 10/02/2015   Procedure: VIDEO BRONCHOSCOPY;  Surgeon: [name], MD;  Location: [facility];  Service: Thoracic;  Laterality: N/A;     Social History   reports that he has never smoked. He has never used smokeless tobacco. He reports current alcohol use. He reports that he does not use drugs.   Family History   His family history includes Hypertension in an other family member.   Allergies No Known Allergies   Home Medications  Prior to Admission medications   Medication Sig Start Date End Date  Taking? Authorizing Provider  acyclovir (ZOVIRAX) 400 MG tablet Take 1 tablet (400 mg total) by mouth daily. 11/18/18  Yes Brunetta Genera, MD  aspirin EC 81 MG tablet Take 1 tablet (81 mg total) by mouth daily. 11/18/18  Yes Brunetta Genera, MD  dexamethasone (DECADRON) 4 MG tablet Take 5 tablets (20 mg total) by mouth once a week. 11/18/18  Yes Brunetta Genera, MD  docusate sodium (COLACE) 50 MG capsule Take 1 capsule (50 mg total) by mouth daily. 03/06/18  Yes [name], PA-C  Fluticasone-Umeclidin-Vilant (TRELEGY ELLIPTA) 100-62.5-25 MCG/INH AEPB Inhale 1 puff into the lungs daily. 07/31/18  Yes Tanda Rockers, MD  lenalidomide (REVLIMID) 25 MG capsule Take 1 capsule (25 mg total) by mouth daily. Take for 21 days and take 7 days off. Authorization#[id number] 10/19/18 11/18/18  Yes Brunetta Genera, MD  omeprazole (PRILOSEC) 40 MG capsule Take 1 capsule (40 mg total) by mouth daily. 08/20/18  Yes Brunetta Genera, MD  oxyCODONE-acetaminophen (PERCOCET/ROXICET) 5-325 MG tablet Take 1 tablet by mouth every 6 (six) hours as needed for severe pain. 08/27/18  Yes Brunetta Genera, MD  OXYGEN 3L BEDTIME ONLY   Yes [provider]  PROVENTIL HFA 108 (90 Base) MCG/ACT inhaler INHALE 2 PUFFS EVERY 6 HOURS AS NEEDED FOR WHEEZING OR SHORTNESS OF BREATH. 10/20/18  Yes Juanito Doom, MD  guaiFENesin (MUCINEX) 600 MG 12 hr tablet Take 2 tablets (1,200 mg total) by mouth 2 (two) times daily. Patient not taking: Reported on 12/06/2018 09/05/18   Oswald Hillock, MD  nortriptyline (PAMELOR) 25 MG capsule Take 1 capsule (25 mg total) by mouth at bedtime. Patient not taking: Reported on 12/06/2018 08/20/18   Brunetta Genera, MD  ondansetron (ZOFRAN) 8 MG tablet Take 1 tablet (8 mg total) by mouth every 8 (eight) hours as needed for nausea. Patient not taking: Reported on 12/06/2018 09/23/18   Brunetta Genera, MD    Rush Farmer, M.D. [facility] Pulmonary/Critical Care  Medicine. Pager: [phone]. After hours pager: [phone].

## 2018-12-08 ENCOUNTER — Inpatient Hospital Stay (HOSPITAL_COMMUNITY): Payer: Medicare Other

## 2018-12-08 ENCOUNTER — Inpatient Hospital Stay: Payer: Medicare Other

## 2018-12-08 DIAGNOSIS — I370 Nonrheumatic pulmonary valve stenosis: Secondary | ICD-10-CM

## 2018-12-08 DIAGNOSIS — I519 Heart disease, unspecified: Secondary | ICD-10-CM

## 2018-12-08 LAB — BASIC METABOLIC PANEL
ANION GAP: 6 (ref 5–15)
BUN: 18 mg/dL (ref 6–20)
CO2: 17 mmol/L — ABNORMAL LOW (ref 22–32)
Calcium: 6.9 mg/dL — ABNORMAL LOW (ref 8.9–10.3)
Chloride: 116 mmol/L — ABNORMAL HIGH (ref 98–111)
Creatinine, Ser: 0.76 mg/dL (ref 0.61–1.24)
GFR calc non Af Amer: >60 mL/min (ref >60)
Glucose, Bld: 133 mg/dL — ABNORMAL HIGH (ref 70–99)
Potassium: 3.2 mmol/L — ABNORMAL LOW (ref 3.5–5.1)
Sodium: 139 mmol/L (ref 135–145)

## 2018-12-08 LAB — CBC
HCT: 30.1 % — ABNORMAL LOW (ref 39.0–52.0)
Hemoglobin: 9.7 g/dL — ABNORMAL LOW (ref 13.0–17.0)
MCH: 24.4 pg — ABNORMAL LOW (ref 26.0–34.0)
MCHC: 32.2 g/dL (ref 30.0–36.0)
MCV: 75.6 fL — ABNORMAL LOW (ref 80.0–100.0)
Platelets: 88 10*3/uL — ABNORMAL LOW (ref 150–400)
RBC: 3.98 MIL/uL — AB (ref 4.22–5.81)
RDW: 16.1 % — ABNORMAL HIGH (ref 11.5–15.5)
WBC: 10.8 10*3/uL — ABNORMAL HIGH (ref 4.0–10.5)
nRBC: 0 % (ref 0.0–0.2)

## 2018-12-08 LAB — ECHOCARDIOGRAM COMPLETE
Height: 60 in
WEIGHTICAEL: 1301.6 [oz_av]

## 2018-12-08 LAB — PROCALCITONIN: Procalcitonin: 2.88 ng/mL

## 2018-12-08 LAB — MAGNESIUM: Magnesium: 1.9 mg/dL (ref 1.7–2.4)

## 2018-12-08 LAB — PHOSPHORUS: Phosphorus: 1.8 mg/dL — ABNORMAL LOW (ref 2.5–4.6)

## 2018-12-08 LAB — ALPHA-1-ANTITRYPSIN: A-1 Antitrypsin, Ser: 235 mg/dL — ABNORMAL HIGH (ref 101–187)

## 2018-12-08 MED ORDER — PREDNISONE 20 MG PO TABS
40.0000 mg | ORAL_TABLET | Freq: Every day | ORAL | Status: DC
  Administered 2018-12-08: 40 mg via ORAL
  Filled 2018-12-08: qty 2

## 2018-12-08 MED ORDER — SIMETHICONE 80 MG PO CHEW
160.0000 mg | CHEWABLE_TABLET | Freq: Four times a day (QID) | ORAL | Status: DC | PRN
  Administered 2018-12-08: 160 mg via ORAL
  Filled 2018-12-08: qty 2

## 2018-12-08 MED ORDER — MORPHINE SULFATE (PF) 2 MG/ML IV SOLN
2.0000 mg | Freq: Once | INTRAVENOUS | Status: AC
  Administered 2018-12-09: 2 mg via INTRAVENOUS
  Filled 2018-12-08: qty 1

## 2018-12-08 MED ORDER — POTASSIUM CHLORIDE CRYS ER 20 MEQ PO TBCR
40.0000 meq | EXTENDED_RELEASE_TABLET | Freq: Three times a day (TID) | ORAL | Status: AC
  Administered 2018-12-08 (×2): 40 meq via ORAL
  Filled 2018-12-08 (×2): qty 2

## 2018-12-08 NOTE — Progress Notes (Signed)
  Echocardiogram 2D Echocardiogram has been performed.  Jennette Dubin 12/08/2018, 10:54 AM

## 2018-12-08 NOTE — Progress Notes (Signed)
Patient ID: [name] [name], male   DOB: 10-10-1961, 58 y.o.   MRN: [id number]         [facility]  Date of Admission:  12/06/2018    Total days of antibiotics 3        Day 1 cefazolin         ASSESSMENT: He has community-acquired pneumonia complicated by transient MSSA bacteremia.  He appears to be improving.  Repeat blood cultures are negative at 12 hours.  TTE is pending.  PLAN: 1. Continue cefazolin 2. Await results of repeat blood cultures and TTE  Principal Problem:   Bacteremia due to methicillin susceptible Staphylococcus aureus (MSSA) Active Problems:   Community acquired pneumonia   Pleural effusion, left   COPD GOLD IV criteria but never smoked    Multiple myeloma ([place])   Acute on chronic respiratory failure with hypoxia (H[facility])   Grade II diastolic dysfunction   Protein-calorie malnutrition, severe   Sepsis ([place])   Alpha-1-antitrypsin deficiency ([place])   Bronchiectasis ([place])   Scheduled Meds: . acyclovir  400 mg Oral Daily  . aspirin EC  81 mg Oral Daily  . docusate sodium  50 mg Oral QHS  . enoxaparin (LOVENOX) injection  30 mg Subcutaneous Q24H  . feeding supplement (ENSURE ENLIVE)  237 mL Oral BID BM  . fluticasone furoate-vilanterol  1 puff Inhalation Daily   And  . umeclidinium bromide  1 puff Inhalation Daily  . levalbuterol  1.25 mg Nebulization Q6H  . multivitamin with minerals  1 tablet Oral Daily  . pantoprazole  40 mg Oral Daily  . potassium chloride  40 mEq Oral TID  . predniSONE  40 mg Oral Q breakfast   Continuous Infusions: . sodium chloride 75 mL/hr at 12/08/18 0857  .  ceFAZolin (ANCEF) IV 2 g (12/08/18 0545)   PRN Meds:.acetaminophen **OR** acetaminophen, guaiFENesin, labetalol, levalbuterol, ondansetron **OR** ondansetron (ZOFRAN) IV, oxyCODONE-acetaminophen, senna-docusate   SUBJECTIVE: He indicates that he is feeling better.  Review of Systems: Review of Systems  Unable to perform ROS: Language    No Known  Allergies  OBJECTIVE: Vitals:   12/08/18 0300 12/08/18 0429 12/08/18 0500 12/08/18 0800  BP: 116/62  119/62   Pulse: 77  67   Resp: (!) 24  (!) 26   Temp:  (!) 97.5 F (36.4 C)  97.9 F (36.6 C)  TempSrc:  Oral  Oral  SpO2: 100%  100%   Weight:      Height:       Body mass index is 15.89 kg/m.  Physical Exam Constitutional:      Comments: He is alert and appears comfortable.  He is sitting up in bed eating breakfast.  Cardiovascular:     Rate and Rhythm: Normal rate and regular rhythm.     Heart sounds: No murmur.  Pulmonary:     Effort: Pulmonary effort is normal.     Breath sounds: Rales present.     Comments: He has prominent bibasilar crackles posteriorly, right greater than left.    Lab Results Lab Results  Component Value Date   WBC 10.8 (H) 12/08/2018   HGB 9.7 (L) 12/08/2018   HCT 30.1 (L) 12/08/2018   MCV 75.6 (L) 12/08/2018   PLT 88 (L) 12/08/2018    Lab Results  Component Value Date   CREATININE 0.76 12/08/2018   BUN 18 12/08/2018   NA 139 12/08/2018   K 3.2 (L) 12/08/2018   CL 116 (H) 12/08/2018   CO2  17 (L) 12/08/2018    Lab Results  Component Value Date   ALT 12 12/02/2018   AST 18 12/02/2018   ALKPHOS 70 12/02/2018   BILITOT 0.3 12/02/2018     Microbiology: Recent Results (from the past 240 hour(s))  Blood culture (routine x 2)     Status: Abnormal (Preliminary result)   Collection Time: 12/06/18 11:53 AM  Result Value Ref Range Status   Specimen Description   Final    BLOOD FOREARM Performed at [facility], [place] [address] [place], [place] [address]    Special Requests   Final    BOTTLES DRAWN AEROBIC AND ANAEROBIC Blood Culture adequate volume Performed at [facility] [address], [place], [place] [address]    Culture  Setup Time   Final    GRAM POSITIVE COCCI ANAEROBIC BOTTLE ONLY Organism ID to follow CRITICAL RESULT CALLED TO, READ BACK BY AND VERIFIED WITH: T GREEN PHARMD 1514 12/07/18 A  BROWNING    Culture (A)  Final    STAPHYLOCOCCUS AUREUS SUSCEPTIBILITIES TO FOLLOW Performed at [facility], [place] [address] [place], [place] [address]    Report Status PENDING  Incomplete  Blood culture (routine x 2)     Status: None (Preliminary result)   Collection Time: 12/06/18 11:53 AM  Result Value Ref Range Status   Specimen Description   Final    BLOOD RIGHT FOREARM Performed at [facility], [place] [address] [place], [place] [address]    Special Requests   Final    BOTTLES DRAWN AEROBIC AND ANAEROBIC Blood Culture adequate volume Performed at [facility] [address], [place], [place] [address]    Culture   Final    NO GROWTH 2 DAYS Performed at [facility] [address] [place], [place] [address]    Report Status PENDING  Incomplete  Blood Culture ID Panel (Reflexed)     Status: Abnormal   Collection Time: 12/06/18 11:53 AM  Result Value Ref Range Status   Enterococcus species NOT DETECTED NOT DETECTED Final   Listeria monocytogenes NOT DETECTED NOT DETECTED Final   Staphylococcus species DETECTED (A) NOT DETECTED Final    Comment: CRITICAL RESULT CALLED TO, READ BACK BY AND VERIFIED WITH: T GREEN PHARMD 1514 12/07/18 A BROWNING    Staphylococcus aureus (BCID) DETECTED (A) NOT DETECTED Final    Comment: Methicillin (oxacillin) susceptible Staphylococcus aureus (MSSA). Preferred therapy is anti staphylococcal beta lactam antibiotic (Cefazolin or Nafcillin), unless clinically contraindicated. CRITICAL RESULT CALLED TO, READ BACK BY AND VERIFIED WITH: T GREEN PHARMD 1514 12/07/18 A BROWNING    Methicillin resistance NOT DETECTED NOT DETECTED Final   Streptococcus species NOT DETECTED NOT DETECTED Final   Streptococcus agalactiae NOT DETECTED NOT DETECTED Final   Streptococcus pneumoniae NOT DETECTED NOT DETECTED Final   Streptococcus pyogenes NOT DETECTED NOT DETECTED Final   Acinetobacter baumannii NOT DETECTED NOT DETECTED  Final   Enterobacteriaceae species NOT DETECTED NOT DETECTED Final   Enterobacter cloacae complex NOT DETECTED NOT DETECTED Final   Escherichia coli NOT DETECTED NOT DETECTED Final   Klebsiella oxytoca NOT DETECTED NOT DETECTED Final   Klebsiella pneumoniae NOT DETECTED NOT DETECTED Final   Proteus species NOT DETECTED NOT DETECTED Final   Serratia marcescens NOT DETECTED NOT DETECTED Final   Haemophilus influenzae NOT DETECTED NOT DETECTED Final   Neisseria meningitidis NOT DETECTED NOT DETECTED Final   Pseudomonas aeruginosa NOT DETECTED NOT DETECTED Final  Candida albicans NOT DETECTED NOT DETECTED Final   Candida glabrata NOT DETECTED NOT DETECTED Final   Candida krusei NOT DETECTED NOT DETECTED Final   Candida parapsilosis NOT DETECTED NOT DETECTED Final   Candida tropicalis NOT DETECTED NOT DETECTED Final    Comment: Performed at [facility], [place] [address], [place], [place] [address]  MRSA PCR Screening     Status: None   Collection Time: 12/06/18  5:41 PM  Result Value Ref Range Status   MRSA by PCR NEGATIVE NEGATIVE Final    Comment:        The GeneXpert MRSA Assay (FDA approved for NASAL specimens only), is one component of a comprehensive MRSA colonization surveillance program. It is not intended to diagnose MRSA infection nor to guide or monitor treatment for MRSA infections. Performed at [facility], [place] [address] [place], [place] [address]   Culture, blood (routine x 2)     Status: None (Preliminary result)   Collection Time: 12/07/18  6:34 PM  Result Value Ref Range Status   Specimen Description   Final    BLOOD RIGHT HAND Performed at [facility] [address] [place], [place] [address]    Special Requests   Final    BOTTLES DRAWN AEROBIC ONLY Blood Culture results may not be optimal due to an inadequate volume of blood received in culture bottles Performed at [facility]  [address] [place], [place] [address]    Culture   Final    NO GROWTH < 12 HOURS Performed at [facility] [address], [place], [place] [address]    Report Status PENDING  Incomplete  Culture, blood (routine x 2)     Status: None (Preliminary result)   Collection Time: 12/07/18  6:35 PM  Result Value Ref Range Status   Specimen Description   Final    BLOOD RIGHT ARM Performed at [facility] [address], [place], [place] [address]    Special Requests   Final    BOTTLES DRAWN AEROBIC ONLY Blood Culture results may not be optimal due to an inadequate volume of blood received in culture bottles Performed at [facility] [address], [place], [place] [address]    Culture   Final    NO GROWTH < 12 HOURS Performed at [facility] [address], [place], [place] [address]    Report Status PENDING  Incomplete    Michel Bickers, MD [facility] [phone] pager   [phone] cell 12/08/2018, 9:19 AM

## 2018-12-08 NOTE — Progress Notes (Signed)
RT Note: Called by pts. RN @ 2257 due to pt. c/o >'d SOB/WOB,  pt. given scheduled Xopenex 1.25 mg at 1950 then requested PRN tx. at 2213 with 0.63 mg, had been ordered BiPAP while in SDU/ICU on 12/07/2018 and only wore for short time,~ X30 minutes and was unable to tolerate, RT tried pt. on [place] BiPAP/CPAP and was only able to tolerate for ~ 15 minutes, placed on HFNC ([place]) at 8 lpm and is now tolerating this better after MS admin. by RN, CXR done, pts. oxygen saturations are maintaining 94% with HR regular at 78, pt. states that he is more comfortable at this time, RT to monitor.

## 2018-12-08 NOTE — Progress Notes (Signed)
NAME:  [name] [name], MRN:  [id number], DOB:  1961/03/10, LOS: 1 ADMISSION DATE:  12/06/2018, CONSULTATION DATE: 12/06/2018 REFERRING MD: Dr. 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  Admitted with dyspnea, cough, hemoptysis and chest x-ray evidence for R HCAP  History of present illness   58 year old never smoker with multiple myeloma being treated with Revlimid, Dexamethasone, and Velcade.  Followed in our office by Dr. 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  Also secondary pulmonary hypertension in the setting of all the above.  Managed on Trelegy, oxygen.   He is here with progressive cough, blood-tinged sputum, low-grade fever that began about 3 days prior to admission.  His last myeloma treatment was last Thursday.  Chest x-ray shows possible progression of right lower lobe airspace disease superimposed on his chronic changes.  He has been tachypneic and tachycardic, has an elevated lactate = 4.    Past Medical History   Past Medical History:  Diagnosis Date  . Acute on chronic respiratory failure ([place]) 02/07/2018  . Hypertension   . Kidney stones   . Multiple myeloma ([place])   . Obstructive lung disease (generalized) ([place])   . Pulmonary hypertension ([place])     Significant Hospital Events     Consults:    Procedures:    Significant Diagnostic Tests:  CT chest 2/2 >>   Micro Data:  Resp 2/2 >>  Blood 2/2 >>   Antimicrobials:  Vanco 2/2 >>  Cefepime 2/2 >>    Interim history/subjective:  No events overnight, no new complaints  Objective   Blood pressure 119/62, pulse 67,  temperature (!) 97.5 F (36.4 C), temperature source Oral, resp. rate (!) 26, height 5' (1.524 m), weight 36.9 kg, SpO2 100 %.        Intake/Output Summary (Last 24 hours) at 12/08/2018 0755 Last data filed at 12/08/2018 0545 Gross per 24 hour  Intake 2411.71 ml  Output 650 ml  Net 1761.71 ml   Filed Weights   12/06/18 1722  Weight: 36.9 kg   Examination: General: Chronically ill appearing emaciated male, NAD HENT: [place]/AT, PERRL, EOM-I and MMM Lungs: Decreased BS on the left Cardiovascular: IRIR, Nl S1/S2 and -M/R/G Abdomen: Soft, NT, ND and +BS Extremities: -edema and -tenderness Neuro: Awake and interactive, moving all ext to command  I reviewed chest CT myself, chronic hydropneumo noted on the left and right sided infiltrate noted  Resolved Hospital Problem list   Discussed with PCCM-NP.  Assessment & Plan:   Acute on chronic hypoxemic respiratory failure with increased work of breathing - Continue 3L [place] (home dose) - Remove BiPAP from room  Right lower lobe healthcare associated pneumonia.  Probably bacterial and due to his known bronchiectasis, immunosuppression due to chemotherapy. Must consider the possibility of invasive aspergillosis given his colonization.  I think this will need to be evaluated with further imaging. - D/C cefepime - Start cefazolin - F/U on culture - ID consult appreciated it  Severe sepsis - KVO IVF - F/U on cultures - Antibiotics as above - Procal 2.88, ID following  Bronchiectasis and COPD (alpha-1 antitrypsin deficient) with acute exacerbation - D/C solumedrol - Prednisone 40 mg  PO daily with slow taper and f/u in the clinic - Bronchodilators ordered - F/U alpha 1 as outpatient - Pulmonary hygienes  Left hydropneumothorax, remote VATS decortication for empyema - No need for change in management at this point  Multiple myeloma - Agree with holding Revlimid for now - Acyclovir ordered  PCCM will sign off, please call back if  needed  Best practice:  Diet: Regular Pain/Anxiety/Delirium protocol (if indicated): N/A VAP protocol (if indicated): N/A DVT prophylaxis: Heparin GI prophylaxis: N/A Glucose control: NA Mobility: Out of bed ad lib. Code Status: Full code Family Communication: No family available 2/2 Disposition: SDU  Labs   CBC: Recent Labs  Lab 12/02/18 1418 12/06/18 1041 12/07/18 0343 12/08/18 0309  WBC 5.7 16.1* 12.1* 10.8*  NEUTROABS 4.1  --   --   --   HGB 11.5* 12.6* 10.6* 9.7*  HCT 36.7* 39.0 34.5* 30.1*  MCV 79.4* 77.1* 81.4 75.6*  PLT 326 142* 112* 88*    Basic Metabolic Panel: Recent Labs  Lab 12/02/18 1418 12/06/18 1041 12/07/18 0343 12/08/18 0309  NA 141 130* 139 139  K 3.8 3.4* 3.5 3.2*  CL 102 94* 114* 116*  CO2 30 23 18* 17*  GLUCOSE 90 162* 156* 133*  BUN '14 13 11 18  '$ CREATININE 0.96 1.06 0.86 0.76  CALCIUM 8.8* 7.5* 5.9* 6.9*  MG  --   --   --  1.9  PHOS  --   --   --  1.8*   GFR: Estimated Creatinine Clearance: 53.8 mL/min (by C-G formula based on SCr of 0.76 mg/dL). Recent Labs  Lab 12/02/18 1418 12/06/18 1041 12/06/18 1153 12/06/18 1434 12/07/18 0343 12/08/18 0309  PROCALCITON  --   --   --   --  3.89 2.88  WBC 5.7 16.1*  --   --  12.1* 10.8*  LATICACIDVEN  --   --  4.0* 3.2*  --   --     Liver Function Tests: Recent Labs  Lab 12/02/18 1418  AST 18  ALT 12  ALKPHOS 70  BILITOT 0.3  PROT 7.4  ALBUMIN 3.4*   No results for input(s): LIPASE, AMYLASE in the last 168 hours. No results for input(s): AMMONIA in the last 168 hours.  ABG    Component Value Date/Time   PHART 7.446 02/08/2018 0539   PCO2ART 43.9 02/08/2018 0539   PO2ART 68.0 (L) 02/08/2018 0539   HCO3 29.9 (H) 02/08/2018 0539   TCO2 32 06/24/2016 0902   O2SAT 94.3 02/08/2018 0539     Coagulation Profile: No results for input(s): INR, PROTIME in the last 168 hours.  Cardiac Enzymes: No results for input(s): CKTOTAL, CKMB, CKMBINDEX, TROPONINI in the last 168  hours.  HbA1C: No results found for: HGBA1C  CBG: No results for input(s): GLUCAP in the last 168 hours.  Review of Systems:   Some nausea, poor p.o. intake, emesis for 2 to 3 days Cough with mid chest discomfort that is somewhat musculoskeletal, productive of yellow-tinged with blood Low-grade fever No diarrhea  Past Medical History  He,  has a past medical history of Acute on chronic respiratory failure ([place]) (02/07/2018), Hypertension, Kidney stones, Multiple myeloma ([place]), Obstructive lung disease (generalized) ([place]), and Pulmonary hypertension ([place]).   Surgical History    Past Surgical History:  Procedure Laterality Date  . CARDIAC CATHETERIZATION N/A 01/18/2016   Procedure: Right Heart Cath;  Surgeon: Jolaine Artist, MD;  Location: [place] [facility];  Service: Cardiovascular;  Laterality: N/A;  .  CARDIAC CATHETERIZATION N/A 06/24/2016   Procedure: Right Heart Cath;  Surgeon: Jolaine Artist, MD;  Location: [place] [facility];  Service: Cardiovascular;  Laterality: N/A;  . IR RADIOLOGIST EVAL & MGMT  05/27/2018  . IR RADIOLOGIST EVAL & MGMT  09/01/2018  . KIDNEY SURGERY    . VIDEO ASSISTED THORACOSCOPY (VATS)/DECORTICATION Left 2003  . VIDEO ASSISTED THORACOSCOPY (VATS)/DECORTICATION Left 10/02/2015   Procedure: VIDEO ASSISTED THORACOSCOPY (VATS)/DECORTICATION and drainage of chronic empyena;  Surgeon: [name], MD;  Location: [facility];  Service: Thoracic;  Laterality: Left;  Marland Kitchen VIDEO BRONCHOSCOPY N/A 10/02/2015   Procedure: VIDEO BRONCHOSCOPY;  Surgeon: [name], MD;  Location: [place];  Service: Thoracic;  Laterality: N/A;     Social History   reports that he has never smoked. He has never used smokeless tobacco. He reports current alcohol use. He reports that he does not use drugs.   Family History   His family history includes Hypertension in an other family member.   Allergies No Known Allergies   Home Medications  Prior to Admission medications    Medication Sig Start Date End Date Taking? Authorizing Provider  acyclovir (ZOVIRAX) 400 MG tablet Take 1 tablet (400 mg total) by mouth daily. 11/18/18  Yes Brunetta Genera, MD  aspirin EC 81 MG tablet Take 1 tablet (81 mg total) by mouth daily. 11/18/18  Yes Brunetta Genera, MD  dexamethasone (DECADRON) 4 MG tablet Take 5 tablets (20 mg total) by mouth once a week. 11/18/18  Yes Brunetta Genera, MD  docusate sodium (COLACE) 50 MG capsule Take 1 capsule (50 mg total) by mouth daily. 03/06/18  Yes [name], PA-C  Fluticasone-Umeclidin-Vilant (TRELEGY ELLIPTA) 100-62.5-25 MCG/INH AEPB Inhale 1 puff into the lungs daily. 07/31/18  Yes Tanda Rockers, MD  lenalidomide (REVLIMID) 25 MG capsule Take 1 capsule (25 mg total) by mouth daily. Take for 21 days and take 7 days off. Authorization#[id number] 10/19/18 11/18/18  Yes Brunetta Genera, MD  omeprazole (PRILOSEC) 40 MG capsule Take 1 capsule (40 mg total) by mouth daily. 08/20/18  Yes Brunetta Genera, MD  oxyCODONE-acetaminophen (PERCOCET/ROXICET) 5-325 MG tablet Take 1 tablet by mouth every 6 (six) hours as needed for severe pain. 08/27/18  Yes Brunetta Genera, MD  OXYGEN 3L BEDTIME ONLY   Yes [provider]  PROVENTIL HFA 108 (90 Base) MCG/ACT inhaler INHALE 2 PUFFS EVERY 6 HOURS AS NEEDED FOR WHEEZING OR SHORTNESS OF BREATH. 10/20/18  Yes Juanito Doom, MD  guaiFENesin (MUCINEX) 600 MG 12 hr tablet Take 2 tablets (1,200 mg total) by mouth 2 (two) times daily. Patient not taking: Reported on 12/06/2018 09/05/18   Oswald Hillock, MD  nortriptyline (PAMELOR) 25 MG capsule Take 1 capsule (25 mg total) by mouth at bedtime. Patient not taking: Reported on 12/06/2018 08/20/18   Brunetta Genera, MD  ondansetron (ZOFRAN) 8 MG tablet Take 1 tablet (8 mg total) by mouth every 8 (eight) hours as needed for nausea. Patient not taking: Reported on 12/06/2018 09/23/18   Brunetta Genera, MD    Rush Farmer,  M.D. [facility] Pulmonary/Critical Care Medicine. Pager: [phone]. After hours pager: [phone].

## 2018-12-08 NOTE — Significant Event (Signed)
Rapid Response Event Note  Overview: Time Called: 2306 Arrival Time: 2312 Event Type: Respiratory  Initial Focused Assessment: Patient sitting up 90 degrees in bed, tripoding. Breathing appears labored, with accessory muscle use. Patient currently on [place]. Crackles, expiratory wheezing heard in all lung fields. Patient agrees to having increased work of breathing.   Interventions: CXR ordered per rapid response protocol. Results of CXR reported to NP. Patient currently on supplemental oxygen 5LNC.  NP notified of patient's increased work of breathing, and after consulting with respiratory therapy, order requested for CPAP. NP placed order for IV Morphine.  Plan of Care (if not transferred): Patient wore the CPAP for less than 5 minutes before removing it himself. Patient's oxygen saturation temporarily dropped to 78% while staff was trying to replace CPAP with supplemental oxygen. Patient then placed on 8LNC and oxygen saturation recovered back to 95%.   I spoke with Lamar Blinks, NP over the phone and she agreed to come to bedside to assess the patient. I remained at the bedside until NP arrived.  NP at bedside to assess patient. NP spoke with PCCM MD and with respiratory therapist. The decision was made not to transfer the patient to a higher level of care. Patient currently on 8L HFNC and oxygen was being weaned down by respiratory therapist.   RN to call rapid response for further needs.  Event Summary:  Casimer Bilis

## 2018-12-08 NOTE — Progress Notes (Signed)
PROGRESS NOTE    [name] [name]  WUJ:[id number] DOB: 1960-12-20 DOA: 12/06/2018 PCP: [name], PA-C    Brief Narrative:  58 y.o. male with past medical history significant for multiple myeloma on Revlimid,  pulmonary hypertension, chronic hypoxic respiratory failure, chronic left-sided hydropneumothorax, bronchiectasis, allergic bronchopulmonary aspergillosis, heterozygous alpha 1 antitrypsin deficiency, originally was treated for empyema with VATS decortication in 2016 who presents complaining of worsening shortness of breath that is started 3 days prior to admission.  He also report worsening productive cough, hemoptysis over the last 3 days.  He reports fever with temperature 100 at home. He denies chest pain, nausea, vomiting.  Evaluation in the ED: Sodium 130, potassium 3.4, chloride 94, calcium 7.5, lactic acid at 4, white blood cell 16, hemoglobin 12, platelets 142. Chest x-ray:Chronic lower lobe opacities, likely scarring/atelectasis, although superimposed right lower lobe pneumonia is not excluded. Moderate left and small right pleural effusions, chronic. Associated calcified pleural plaques, suggesting asbestos related pleural disease.  Review of Systems: All systems reviewed and apart from history of presenting illness, are negative.  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  -Initially continued  on IV vancomycin, IV cefepime. -Improved with IV fluid hydration -MRSA swab neg, thus vanc d/c'd -Transitioned to ancef per ID. ID recommendation for TTE. Study performed, pending results  2-Acute on chronic hypoxic respiratory failure:  -History of chronic left hydropneumothorax, bronchiectasis, allergic pulmonary aspergillosis; COPD Gold 4 - continue abx with steroids and bronchodilator as tolerated -Schedule nebulizer treatment, IV Solu-Medrol. -BiPAP was ordered initially, now off bipap and on home 3LNC -CCM was consulted initially, since signed off 2/4  3-Multiple myeloma: Patient to follow with Dr. Linwood Dibbles, added to rounding team. - Revlimid on hold for now.   4-Hypokalemia: Replaced  5 Hyponatremia: Improved with IVF hydration  6. Hypocalcemia - Replaced - Repeat lytes in AM  7. New Rapid afib -Rapid fib noted overnight, now on amio gtt -Now in sinus -Hydrate to allow for BP to tolerate beta blocker and wean off amio -Suspect provoked afib from acute illness. CHADS-VASc of 1. Pt already on ASA prior to admit -Remains rate controlled  8. Hypotension -BP much improved with IVF hydration. Pt tolerating PO intake. Will hold further IVF  DVT prophylaxis: Lovenox subq Code Status: Full Family Communication: Pt in room, family not at bedside Disposition Plan: Uncertain at this time  Consultants:   CCM  Procedures:     Antimicrobials: Anti-infectives (From admission, onward)   Start     Dose/Rate Route Frequency Ordered Stop   12/08/18 0100  vancomycin (VANCOCIN) IVPB 750 mg/150 ml premix  Status:  Discontinued     750 mg 150 mL/hr over 60 Minutes Intravenous Every 36 hours 12/06/18 1441 12/07/18 0919   12/07/18 2200  ceFAZolin (ANCEF) IVPB 2g/100 mL premix     2 g 200 mL/hr over 30 Minutes Intravenous Every 8 hours 12/07/18 1828     12/07/18 0100  ceFEPIme (MAXIPIME) 1  g in sodium chloride 0.9 % 100 mL IVPB  Status:  Discontinued     1 g 200 mL/hr over 30  Minutes Intravenous Every 12 hours 12/06/18 1441 12/07/18 1812   12/06/18 1830  acyclovir (ZOVIRAX) tablet 400 mg     400 mg Oral Daily 12/06/18 1757     12/06/18 1145  vancomycin (VANCOCIN) IVPB 750 mg/150 ml premix     750 mg 150 mL/hr over 60 Minutes Intravenous STAT 12/06/18 1136 12/06/18 1402   12/06/18 1130  ceFEPIme (MAXIPIME) 1 g in sodium chloride 0.9 % 100 mL IVPB     1 g 200 mL/hr over 30 Minutes Intravenous  Once 12/06/18 1129 12/06/18 1256      Subjective: States feeling better  Objective: Vitals:   12/08/18 0930 12/08/18 1000 12/08/18 1100 12/08/18 1200  BP:  128/77 (!) 141/82 (!) 149/84  Pulse:  81 84 83  Resp:  (!) 24 (!) 25 (!) 24  Temp:    (!) 97.4 F (36.3 C)  TempSrc:    Oral  SpO2: 99% 99% 99% 98%  Weight:      Height:        Intake/Output Summary (Last 24 hours) at 12/08/2018 1308 Last data filed at 12/08/2018 1200 Gross per 24 hour  Intake 2837.95 ml  Output 650 ml  Net 2187.95 ml   Filed Weights   12/06/18 1722  Weight: 36.9 kg    Examination: General exam: Awake, laying in bed, in nad Respiratory system: Normal respiratory effort, no wheezing Cardiovascular system: regular rate, s1, s2 Gastrointestinal system: Soft, nondistended, positive BS Central nervous system: CN2-12 grossly intact, strength intact Extremities: Perfused, no clubbing Skin: Normal skin turgor, no notable skin lesions seen Psychiatry: Mood normal // no visual hallucinations   Data Reviewed: I have personally reviewed following labs and imaging studies  CBC: Recent Labs  Lab 12/02/18 1418 12/06/18 1041 12/07/18 0343 12/08/18 0309  WBC 5.7 16.1* 12.1* 10.8*  NEUTROABS 4.1  --   --   --   HGB 11.5* 12.6* 10.6* 9.7*  HCT 36.7* 39.0 34.5* 30.1*  MCV 79.4* 77.1* 81.4 75.6*  PLT 326 142* 112* 88*   Basic Metabolic Panel: Recent Labs  Lab 12/02/18 1418 12/06/18 1041 12/07/18 0343 12/08/18 0309  NA 141 130* 139 139  K 3.8 3.4* 3.5 3.2*  CL 102 94* 114* 116*    CO2 30 23 18* 17*  GLUCOSE 90 162* 156* 133*  BUN _0 CREATININE 0.96 1.06 0.86 0.76  CALCIUM 8.8* 7.5* 5.9* 6.9*  MG  --   --   --  1.9  PHOS  --   --   --  1.8*   GFR: Estimated Creatinine Clearance: 53.8 mL/min (by C-G formula based on SCr of 0.76 mg/dL). Liver Function Tests: Recent Labs  Lab 12/02/18 1418  AST 18  ALT 12  ALKPHOS 70  BILITOT 0.3  PROT 7.4  ALBUMIN 3.4*   No results for input(s): LIPASE, AMYLASE in the last 168 hours. No results for input(s): AMMONIA in the last 168 hours. Coagulation Profile: No results for input(s): INR, PROTIME in the last 168 hours. Cardiac Enzymes: No results for input(s): CKTOTAL, CKMB, CKMBINDEX, TROPONINI in the last 168 hours. BNP (last 3 results) No results for input(s): PROBNP in the last 8760 hours. HbA1C: No results for input(s): HGBA1C in the last 72 hours. CBG: No results for input(s): GLUCAP in the last 168 hours. Lipid Profile: No results for input(s): CHOL,  HDL, LDLCALC, TRIG, CHOLHDL, LDLDIRECT in the last 72 hours. Thyroid Function Tests: No results for input(s): TSH, T4TOTAL, FREET4, T3FREE, THYROIDAB in the last 72 hours. Anemia Panel: No results for input(s): VITAMINB12, FOLATE, FERRITIN, TIBC, IRON, RETICCTPCT in the last 72 hours. Sepsis Labs: Recent Labs  Lab 12/06/18 1153 12/06/18 1434 12/07/18 0343 12/08/18 0309  PROCALCITON  --   --  3.89 2.88  LATICACIDVEN 4.0* 3.2*  --   --     Recent Results (from the past 240 hour(s))  Blood culture (routine x 2)     Status: Abnormal (Preliminary result)   Collection Time: 12/06/18 11:53 AM  Result Value Ref Range Status   Specimen Description   Final    BLOOD FOREARM Performed at [facility], [place] [address], [place], [place] [address]    Special Requests   Final    BOTTLES DRAWN AEROBIC AND ANAEROBIC Blood Culture adequate volume Performed at [facility] [address], [place], [place] [address]    Culture   Setup Time   Final    GRAM POSITIVE COCCI ANAEROBIC BOTTLE ONLY Organism ID to follow CRITICAL RESULT CALLED TO, READ BACK BY AND VERIFIED WITH: T GREEN PHARMD 1514 12/07/18 A BROWNING    Culture (A)  Final    STAPHYLOCOCCUS AUREUS SUSCEPTIBILITIES TO FOLLOW Performed at [facility], [place] [address] [place], [place] [address]    Report Status PENDING  Incomplete  Blood culture (routine x 2)     Status: None (Preliminary result)   Collection Time: 12/06/18 11:53 AM  Result Value Ref Range Status   Specimen Description   Final    BLOOD RIGHT FOREARM Performed at [facility], [place] [address] [place], [place] [address]    Special Requests   Final    BOTTLES DRAWN AEROBIC AND ANAEROBIC Blood Culture adequate volume Performed at [facility] [address] [place], [place] [address]    Culture   Final    NO GROWTH 2 DAYS Performed at [facility] [address] [place], [place] [address]    Report Status PENDING  Incomplete  Blood Culture ID Panel (Reflexed)     Status: Abnormal   Collection Time: 12/06/18 11:53 AM  Result Value Ref Range Status   Enterococcus species NOT DETECTED NOT DETECTED Final   Listeria monocytogenes NOT DETECTED NOT DETECTED Final   Staphylococcus species DETECTED (A) NOT DETECTED Final    Comment: CRITICAL RESULT CALLED TO, READ BACK BY AND VERIFIED WITH: T GREEN PHARMD 1514 12/07/18 A BROWNING    Staphylococcus aureus (BCID) DETECTED (A) NOT DETECTED Final    Comment: Methicillin (oxacillin) susceptible Staphylococcus aureus (MSSA). Preferred therapy is anti staphylococcal beta lactam antibiotic (Cefazolin or Nafcillin), unless clinically contraindicated. CRITICAL RESULT CALLED TO, READ BACK BY AND VERIFIED WITH: T GREEN PHARMD 1514 12/07/18 A BROWNING    Methicillin resistance NOT DETECTED NOT DETECTED Final   Streptococcus species NOT DETECTED NOT DETECTED Final   Streptococcus agalactiae NOT DETECTED NOT DETECTED  Final   Streptococcus pneumoniae NOT DETECTED NOT DETECTED Final   Streptococcus pyogenes NOT DETECTED NOT DETECTED Final   Acinetobacter baumannii NOT DETECTED NOT DETECTED Final   Enterobacteriaceae species NOT DETECTED NOT DETECTED Final   Enterobacter cloacae complex NOT DETECTED NOT DETECTED Final   Escherichia coli NOT DETECTED NOT DETECTED Final   Klebsiella oxytoca NOT DETECTED NOT DETECTED Final   Klebsiella pneumoniae NOT DETECTED NOT DETECTED Final  Proteus species NOT DETECTED NOT DETECTED Final   Serratia marcescens NOT DETECTED NOT DETECTED Final   Haemophilus influenzae NOT DETECTED NOT DETECTED Final   Neisseria meningitidis NOT DETECTED NOT DETECTED Final   Pseudomonas aeruginosa NOT DETECTED NOT DETECTED Final   Candida albicans NOT DETECTED NOT DETECTED Final   Candida glabrata NOT DETECTED NOT DETECTED Final   Candida krusei NOT DETECTED NOT DETECTED Final   Candida parapsilosis NOT DETECTED NOT DETECTED Final   Candida tropicalis NOT DETECTED NOT DETECTED Final    Comment: Performed at [facility], [place] [address] [place], [place] [address]  MRSA PCR Screening     Status: None   Collection Time: 12/06/18  5:41 PM  Result Value Ref Range Status   MRSA by PCR NEGATIVE NEGATIVE Final    Comment:        The GeneXpert MRSA Assay (FDA approved for NASAL specimens only), is one component of a comprehensive MRSA colonization surveillance program. It is not intended to diagnose MRSA infection nor to guide or monitor treatment for MRSA infections. Performed at [facility], [place] [address] [place], [place] [address]   Culture, blood (routine x 2)     Status: None (Preliminary result)   Collection Time: 12/07/18  6:34 PM  Result Value Ref Range Status   Specimen Description   Final    BLOOD RIGHT HAND Performed at [facility] [address], [place], [place] [address]    Special Requests   Final    BOTTLES DRAWN  AEROBIC ONLY Blood Culture results may not be optimal due to an inadequate volume of blood received in culture bottles Performed at [facility] [address], [place], [place] [address]    Culture   Final    NO GROWTH < 12 HOURS Performed at [facility] [address] [place], [place] [address]    Report Status PENDING  Incomplete  Culture, blood (routine x 2)     Status: None (Preliminary result)   Collection Time: 12/07/18  6:35 PM  Result Value Ref Range Status   Specimen Description   Final    BLOOD RIGHT ARM Performed at [place] [address], [place], [place] [address]    Special Requests   Final    BOTTLES DRAWN AEROBIC ONLY Blood Culture results may not be optimal due to an inadequate volume of blood received in culture bottles Performed at [facility] [address], [place], [place] [address]    Culture   Final    NO GROWTH < 12 HOURS Performed at [facility] [address], [place], [place] [address]    Report Status PENDING  Incomplete     Radiology Studies: Ct Chest Wo Contrast  Result Date: 12/06/2018 CLINICAL DATA:  H/O multiple myeloma on Revlimid, pulmonary hypertension, chronic hypoxic respiratory failure, chronic left-sided hydropneumothorax, bronchiectasis, allergic bronchopulmonary aspergillosis, heterozygous alpha 1 antitrypsin deficiency, originally was treated for empyema with VATS decortication in 2016 Today presents with worsening shortness of breath that is started 3 days prior to admission. He also report worsening productive cough, hemoptysis over the last 3 days. EXAM: CT CHEST WITHOUT CONTRAST TECHNIQUE: Multidetector CT imaging of the chest was performed following the standard protocol without IV contrast. COMPARISON:  Current chest radiograph.  Prior chest CT, 09/03/2018. FINDINGS: Cardiovascular: Heart is normal in size. No significant pericardial effusion. No coronary  artery calcifications. Aorta is normal in  caliber.  Mild aortic atherosclerosis. Pulmonary arteries are enlarged. Main pulmonary artery measures 4.7 cm, right pulmonary artery 3.2 cm in left pulmonary artery 3 cm. Mediastinum/Nodes: No neck base or axillary masses or enlarged lymph nodes. No mediastinal or hilar masses or pathologically enlarged lymph nodes. Trachea and esophagus are unremarkable. Lungs/Pleura: There is extensive consolidation throughout the right lower lobe new since the prior CT. Loculated left pleural effusion with pleural calcifications. There is pleural space air, containing tissue along the posterolateral mid to upper left hemithorax. These findings are without significant change from the prior chest CT. Linear and coarse reticular opacities noted in the peripheral left upper lower lobes, right middle lobe and base of the right upper lobe consistent with scarring/atelectasis, similar to the prior study. Minimal right pleural effusion. Pleural base calcifications on the right stable from the prior study. No evidence of pulmonary edema.  No pneumothorax. Upper Abdomen: No acute findings. Musculoskeletal: No chest wall mass or suspicious bone lesions identified. IMPRESSION: 1. Extensive consolidation throughout the right lower lobe, new since the prior exam, consistent with pneumonia. 2. Chronic changes as detailed above, stable from the prior exam, including loculated left pleural fluid, a loculated area of left hemithorax pleural air containing tissue and surrounded by pleural based calcifications, areas of reticular scarring and/or atelectasis and right-sided pleural base calcifications. 3. There is also significant enlargement of the pulmonary arteries consistent with pulmonary hypertension. 4. Mild aortic atherosclerosis. Aortic Atherosclerosis (ICD10-I70.0). Electronically Signed   By: Lajean Manes M.D.   On: 12/06/2018 15:55    Scheduled Meds: . acyclovir  400 mg Oral Daily  .  aspirin EC  81 mg Oral Daily  . docusate sodium  50 mg Oral QHS  . enoxaparin (LOVENOX) injection  30 mg Subcutaneous Q24H  . feeding supplement (ENSURE ENLIVE)  237 mL Oral BID BM  . fluticasone furoate-vilanterol  1 puff Inhalation Daily   And  . umeclidinium bromide  1 puff Inhalation Daily  . levalbuterol  1.25 mg Nebulization Q6H  . multivitamin with minerals  1 tablet Oral Daily  . pantoprazole  40 mg Oral Daily  . potassium chloride  40 mEq Oral TID  . predniSONE  40 mg Oral Q breakfast   Continuous Infusions: .  ceFAZolin (ANCEF) IV 2 g (12/08/18 0545)     LOS: 1 day   Marylu Lund, MD [facility] Pager On Amion  If 7PM-7AM, please contact night-coverage 12/08/2018, 1:08 PM

## 2018-12-08 NOTE — Progress Notes (Signed)
[name] [name], [name] [name] 1232 transferred to 1417 in wheelchair with o2 2l [place]. Report was called to [facility].

## 2018-12-09 ENCOUNTER — Ambulatory Visit: Payer: Self-pay | Admitting: Pulmonary Disease

## 2018-12-09 ENCOUNTER — Ambulatory Visit: Payer: Self-pay

## 2018-12-09 ENCOUNTER — Inpatient Hospital Stay (HOSPITAL_COMMUNITY): Payer: Medicare Other

## 2018-12-09 ENCOUNTER — Other Ambulatory Visit: Payer: Self-pay

## 2018-12-09 DIAGNOSIS — R6521 Severe sepsis with septic shock: Secondary | ICD-10-CM

## 2018-12-09 DIAGNOSIS — R0902 Hypoxemia: Secondary | ICD-10-CM

## 2018-12-09 DIAGNOSIS — Z9911 Dependence on respirator [ventilator] status: Secondary | ICD-10-CM

## 2018-12-09 DIAGNOSIS — R0603 Acute respiratory distress: Secondary | ICD-10-CM

## 2018-12-09 LAB — COMPREHENSIVE METABOLIC PANEL
ALT: 74 U/L — AB (ref 0–44)
AST: 157 U/L — ABNORMAL HIGH (ref 15–41)
Albumin: 2.3 g/dL — ABNORMAL LOW (ref 3.5–5.0)
Alkaline Phosphatase: 45 U/L (ref 38–126)
Anion gap: 4 — ABNORMAL LOW (ref 5–15)
BUN: 33 mg/dL — ABNORMAL HIGH (ref 6–20)
CO2: 21 mmol/L — AB (ref 22–32)
Calcium: 7 mg/dL — ABNORMAL LOW (ref 8.9–10.3)
Chloride: 116 mmol/L — ABNORMAL HIGH (ref 98–111)
Creatinine, Ser: 0.9 mg/dL (ref 0.61–1.24)
GFR calc Af Amer: >60 mL/min (ref >60)
GFR calc non Af Amer: >60 mL/min (ref >60)
Glucose, Bld: 160 mg/dL — ABNORMAL HIGH (ref 70–99)
Potassium: 5.4 mmol/L — ABNORMAL HIGH (ref 3.5–5.1)
SODIUM: 141 mmol/L (ref 135–145)
Total Bilirubin: 0.5 mg/dL (ref 0.3–1.2)
Total Protein: 5.4 g/dL — ABNORMAL LOW (ref 6.5–8.1)

## 2018-12-09 LAB — CULTURE, BLOOD (ROUTINE X 2): Special Requests: ADEQUATE

## 2018-12-09 LAB — BLOOD GAS, ARTERIAL
Acid-base deficit: 8.6 mmol/L — ABNORMAL HIGH (ref 0.0–2.0)
Acid-base deficit: 6.9 mmol/L — ABNORMAL HIGH (ref 0.0–2.0)
Acid-base deficit: 7 mmol/L — ABNORMAL HIGH (ref 0.0–2.0)
BICARBONATE: 22.7 mmol/L (ref 20.0–28.0)
Bicarbonate: 22.7 mmol/L (ref 20.0–28.0)
Bicarbonate: 20.5 mmol/L (ref 20.0–28.0)
DELIVERY SYSTEMS: POSITIVE
Delivery systems: POSITIVE
Drawn by: 232811
Drawn by: 232811
Drawn by: 441261
Expiratory PAP: 12
Expiratory PAP: 6
FIO2: 70
FIO2: 70
FIO2: 100
Inspiratory PAP: 14
LHR: 22 {breaths}/min
O2 Saturation: 92.2 %
O2 Saturation: 95.2 %
O2 Saturation: 98.7 %
PATIENT TEMPERATURE: 98.6
PEEP: 5 cmH2O
PO2 ART: 83.8 mmHg (ref 83.0–108.0)
PO2 ART: 195 mmHg — AB (ref 83.0–108.0)
Patient temperature: 97.5
Patient temperature: 97.5
VT: 400 mL
pCO2 arterial: 79.6 mmHg (ref 32.0–48.0)
pCO2 arterial: 68.5 mmHg (ref 32.0–48.0)
pCO2 arterial: 54.3 mmHg — ABNORMAL HIGH (ref 32.0–48.0)
pH, Arterial: 7.078 — CL (ref 7.350–7.450)
pH, Arterial: 7.143 — CL (ref 7.350–7.450)
pH, Arterial: 7.202 — ABNORMAL LOW (ref 7.350–7.450)
pO2, Arterial: 93 mmHg (ref 83.0–108.0)

## 2018-12-09 LAB — BRAIN NATRIURETIC PEPTIDE: B NATRIURETIC PEPTIDE 5: 644.3 pg/mL — AB (ref 0.0–100.0)

## 2018-12-09 LAB — PROCALCITONIN: Procalcitonin: 1.59 ng/mL

## 2018-12-09 MED ORDER — DEXMEDETOMIDINE HCL IN NACL 200 MCG/50ML IV SOLN
0.2000 ug/kg/h | INTRAVENOUS | Status: DC
  Administered 2018-12-09: 0.4 ug/kg/h via INTRAVENOUS
  Filled 2018-12-09: qty 50

## 2018-12-09 MED ORDER — FENTANYL CITRATE (PF) 100 MCG/2ML IJ SOLN
100.0000 ug | INTRAMUSCULAR | Status: DC | PRN
  Administered 2018-12-09 – 2018-12-11 (×9): 100 ug via INTRAVENOUS
  Filled 2018-12-09 (×4): qty 2

## 2018-12-09 MED ORDER — NOREPINEPHRINE 4 MG/250ML-% IV SOLN
INTRAVENOUS | Status: AC
  Filled 2018-12-09: qty 250

## 2018-12-09 MED ORDER — ORAL CARE MOUTH RINSE
15.0000 mL | OROMUCOSAL | Status: DC
  Administered 2018-12-09 – 2018-12-14 (×43): 15 mL via OROMUCOSAL

## 2018-12-09 MED ORDER — NOREPINEPHRINE 4 MG/250ML-% IV SOLN
0.0000 ug/min | INTRAVENOUS | Status: DC
  Administered 2018-12-09: 10 ug/min via INTRAVENOUS
  Administered 2018-12-09: 4 ug/min via INTRAVENOUS
  Filled 2018-12-09: qty 250

## 2018-12-09 MED ORDER — SUCCINYLCHOLINE CHLORIDE 20 MG/ML IJ SOLN
100.0000 mg | Freq: Once | INTRAMUSCULAR | Status: AC
  Administered 2018-12-09: 100 mg via INTRAVENOUS
  Filled 2018-12-09: qty 5

## 2018-12-09 MED ORDER — METHYLPREDNISOLONE SODIUM SUCC 40 MG IJ SOLR
40.0000 mg | Freq: Three times a day (TID) | INTRAMUSCULAR | Status: AC
  Administered 2018-12-09 – 2018-12-10 (×3): 40 mg via INTRAVENOUS
  Filled 2018-12-09 (×3): qty 1

## 2018-12-09 MED ORDER — BISACODYL 10 MG RE SUPP: 10.0000 mg | Freq: Every day | RECTAL | Status: DC | PRN

## 2018-12-09 MED ORDER — METHYLPREDNISOLONE SODIUM SUCC 125 MG IJ SOLR
125.0000 mg | Freq: Once | INTRAMUSCULAR | Status: AC
  Administered 2018-12-09: 125 mg via INTRAVENOUS
  Filled 2018-12-09: qty 2

## 2018-12-09 MED ORDER — METHYLPREDNISOLONE SODIUM SUCC 125 MG IJ SOLR: 60.0000 mg | Freq: Four times a day (QID) | INTRAMUSCULAR | Status: DC

## 2018-12-09 MED ORDER — ETOMIDATE 2 MG/ML IV SOLN
20.0000 mg | Freq: Once | INTRAVENOUS | Status: AC
  Administered 2018-12-09: 20 mg via INTRAVENOUS

## 2018-12-09 MED ORDER — ADULT MULTIVITAMIN LIQUID CH
15.0000 mL | Freq: Every day | ORAL | Status: DC
  Administered 2018-12-09 – 2018-12-12 (×4): 15 mL
  Filled 2018-12-09 (×6): qty 15

## 2018-12-09 MED ORDER — SENNOSIDES 8.8 MG/5ML PO SYRP
5.0000 mL | ORAL_SOLUTION | Freq: Two times a day (BID) | ORAL | Status: DC | PRN
  Filled 2018-12-09: qty 5

## 2018-12-09 MED ORDER — ASPIRIN 81 MG PO CHEW
81.0000 mg | CHEWABLE_TABLET | Freq: Every day | ORAL | Status: DC
  Administered 2018-12-09 – 2018-12-10 (×2): 81 mg via ORAL
  Filled 2018-12-09 (×3): qty 1

## 2018-12-09 MED ORDER — CHLORHEXIDINE GLUCONATE CLOTH 2 % EX PADS
6.0000 | MEDICATED_PAD | Freq: Every day | CUTANEOUS | Status: DC
  Administered 2018-12-09 – 2018-12-15 (×6): 6 via TOPICAL

## 2018-12-09 MED ORDER — CHLORHEXIDINE GLUCONATE 0.12 % MT SOLN
OROMUCOSAL | Status: AC
  Administered 2018-12-09: 15 mL via OROMUCOSAL
  Filled 2018-12-09: qty 15

## 2018-12-09 MED ORDER — SODIUM CHLORIDE 0.9 % IV BOLUS
1000.0000 mL | Freq: Once | INTRAVENOUS | Status: AC
  Administered 2018-12-09: 1000 mL via INTRAVENOUS

## 2018-12-09 MED ORDER — PANTOPRAZOLE SODIUM 40 MG PO PACK
40.0000 mg | PACK | Freq: Every day | ORAL | Status: DC
  Administered 2018-12-09 – 2018-12-12 (×4): 40 mg
  Filled 2018-12-09 (×5): qty 20

## 2018-12-09 MED ORDER — MORPHINE SULFATE (PF) 2 MG/ML IV SOLN
1.0000 mg | Freq: Once | INTRAVENOUS | Status: AC
  Administered 2018-12-09: 1 mg via INTRAVENOUS
  Filled 2018-12-09: qty 1

## 2018-12-09 MED ORDER — SODIUM CHLORIDE 0.9% FLUSH: 10.0000 mL | INTRAVENOUS | Status: DC | PRN

## 2018-12-09 MED ORDER — DOCUSATE SODIUM 50 MG/5ML PO LIQD
50.0000 mg | Freq: Every day | ORAL | Status: DC
  Administered 2018-12-09 – 2018-12-12 (×4): 50 mg
  Filled 2018-12-09 (×4): qty 10

## 2018-12-09 MED ORDER — CHLORHEXIDINE GLUCONATE 0.12% ORAL RINSE (MEDLINE KIT)
15.0000 mL | Freq: Two times a day (BID) | OROMUCOSAL | Status: DC
  Administered 2018-12-09 – 2018-12-13 (×8): 15 mL via OROMUCOSAL

## 2018-12-09 MED ORDER — FENTANYL CITRATE (PF) 100 MCG/2ML IJ SOLN
100.0000 ug | INTRAMUSCULAR | Status: DC | PRN
  Administered 2018-12-09 (×2): 100 ug via INTRAVENOUS
  Filled 2018-12-09 (×6): qty 2

## 2018-12-09 MED ORDER — NOREPINEPHRINE 4 MG/250ML-% IV SOLN: 0.0000 ug/min | INTRAVENOUS | Status: DC

## 2018-12-09 MED ORDER — DEXMEDETOMIDINE HCL IN NACL 200 MCG/50ML IV SOLN
0.0000 ug/kg/h | INTRAVENOUS | Status: AC
  Administered 2018-12-09: 0.9 ug/kg/h via INTRAVENOUS
  Administered 2018-12-09: 0.8 ug/kg/h via INTRAVENOUS
  Administered 2018-12-09: 0.756 ug/kg/h via INTRAVENOUS
  Administered 2018-12-09: 0.4 ug/kg/h via INTRAVENOUS
  Administered 2018-12-10: 1 ug/kg/h via INTRAVENOUS
  Administered 2018-12-10: 1.2 ug/kg/h via INTRAVENOUS
  Administered 2018-12-10 (×3): 1 ug/kg/h via INTRAVENOUS
  Administered 2018-12-11: 1.2 ug/kg/h via INTRAVENOUS
  Administered 2018-12-11: 0.8 ug/kg/h via INTRAVENOUS
  Administered 2018-12-11: 1 ug/kg/h via INTRAVENOUS
  Administered 2018-12-11: 0.7 ug/kg/h via INTRAVENOUS
  Administered 2018-12-12: 0.9 ug/kg/h via INTRAVENOUS
  Filled 2018-12-09: qty 100
  Filled 2018-12-09 (×10): qty 50
  Filled 2018-12-09: qty 100

## 2018-12-09 NOTE — Progress Notes (Signed)
[facility] Progress Note Patient Name: [name] [name] DOB: 1961-09-06 MRN: [id number]   Date of Service  12/09/2018  HPI/Events of Note  Repeat ABG on BIPAP PH 7.14. Patient has failed non-invasive ventilation.  eICU Interventions  I've requested the [facility] ED physician to intubate the patient        Frederik Pear 12/09/2018, 6:31 AM

## 2018-12-09 NOTE — Progress Notes (Addendum)
Nutrition Follow-up  DOCUMENTATION CODES:   Severe malnutrition in context of chronic illness, Underweight  INTERVENTION:  - If patient to remain intubated >/= 24 hours, recommend initiation of TF. - Recommend Vital AF 1.2 @ 25 ml/hr to advance by 10 ml every 8 hours to reach goal rate of 45 ml/hr. - At goal rate, this regimen will provide 1296 kcal (93% estimated kcal need), 81 grams of protein, and 876 ml free water.   Monitor magnesium, potassium, and phosphorus daily for at least 3 days, MD to replete as needed, as pt is at risk for refeeding syndrome given severe malnutrition, poor nutrition PTA.    NUTRITION DIAGNOSIS:   Severe Malnutrition related to chronic illness, cancer and cancer related treatments as evidenced by moderate fat depletion, severe fat depletion, moderate muscle depletion, severe muscle depletion. -ongoing  GOAL:   Patient will meet greater than or equal to 90% of their needs -unmet/unable to meet  MONITOR:   Vent status, Weight trends, Labs  REASON FOR ASSESSMENT:   Ventilator  ASSESSMENT:   58 year-old male with past medical history significant for multiple myeloma on Revlimid,  pulmonary HTN, chronic hypoxic respiratory failure, chronic L-sided hydropneumothorax, bronchiectasis, allergic bronchopulmonary aspergillosis, and heterozygous alpha 1 antitrypsin deficiency. Patient presented to the ED with SOB, worsening productive cough, and hyemoptysis x3 days PTA. He reported fever with temperature 100 at home. He denies chest dain, nausea, vomiting. Patient was admitted with dx of HCAP; s/p CXR.  Weight +7 kg compared to admission weight. Patient was intubated and OGT placed shortly before 7:00 AM today. No family/visitors present at this time. RN at bedside.   Per Dr. Pura Spice note this AM: RLL HCAP, severe sepsis with septic shock, bronchiectasis and COPD with acute on chronic hypoxemic respiratory failure, L hydropneumothorax, multiple myeloma.     Patient is currently intubated on ventilator support MV: 13.6 L/min Temp (24hrs), Avg:97.7 F (36.5 C), Min:97.4 F (36.3 C), Max:97.9 F (36.6 C) Propofol: none BP: 159/99 and MAP: 120   Medications reviewed; 50 mg colace/day, 40 mg solu-medrol TID, 15 ml liquid multivitamin per OGT/day, 40 mg protonix per OGT/day, 40 mEq K-Dur x2 doses 2/4. Labs reviewed; K: 5.4 mmol/l, Cl: 116 mmol/l, BUN: 33 mg/dl, Ca: 7 mg/dl, LFTs elevated.  Drips; levo @ 4 mcg/min, precedex @ 1 mcg/kg/hr.        Diet Order:   Diet Order    None      EDUCATION NEEDS:   No education needs have been identified at this time  Skin:  Skin Assessment: Reviewed RN Assessment  Last BM:  2/5  Height:   Ht Readings from Last 1 Encounters:  12/09/18 5' (1.524 m)    Weight:   Wt Readings from Last 1 Encounters:  12/09/18 43.9 kg    Ideal Body Weight:  48.18 kg  BMI:  Body mass index is 18.9 kg/m.  Estimated Nutritional Needs:   Kcal:  1397 kcal  Protein:  66-88 grams (1.5-2 grams/kg)  Fluid:  >/= 1.6 L/day     Jarome Matin, MS, RD, LDN, [facility] Inpatient Clinical Dietitian Pager # [phone] After hours/weekend pager # [phone]

## 2018-12-09 NOTE — Procedures (Addendum)
Central Venous Catheter Insertion Procedure Note [name] [name] [id number] 08/27/1961  Procedure: Insertion of Central Venous Catheter Indications: Assessment of intravascular volume, Drug and/or fluid administration and Frequent blood sampling  Procedure Details Consent: Risks of procedure as well as the alternatives and risks of each were explained to the (patient/caregiver).  Consent for procedure obtained. Time Out: Verified patient identification, verified procedure, site/side was marked, verified correct patient position, special equipment/implants available, medications/allergies/relevent history reviewed, required imaging and test results available.  Performed  Maximum sterile technique was used including antiseptics, cap, gloves, gown, hand hygiene, mask and sheet. Skin prep: Chlorhexidine; local anesthetic administered A antimicrobial bonded/coated triple lumen catheter was placed in the right internal jugular vein to 15 cm using the Seldinger technique.  Biopatch applied, sutured in place.  Evaluation Blood flow good Complications: No apparent complications Patient did tolerate procedure well. Chest X-ray ordered to verify placement.  CXR: pending.   Procedure performed under direct supervision of Dr. Nelda Marseille and with ultrasound guidance for real time vessel cannulation.      [name] Gens, NP-C [facility] Pgr: [phone] or if no answer [phone] 12/09/2018, 10:25 AM  Rush Farmer, M.D. [facility] Pulmonary/Critical Care Medicine. Pager: [phone]. After hours pager: [phone].

## 2018-12-09 NOTE — Progress Notes (Addendum)
PT Cancellation Note  Patient Details Name: Taray Normoyle MRN: [id number] DOB: 03-18-1961   Cancelled Treatment:    Reason Eval/Treat Not Completed: Medical issues which prohibited therapy, intubated. Please reorder  PT when indicated.   Claretha Cooper 12/09/2018, 8:23 AM  [place] Pager [phone] Office [phone]

## 2018-12-09 NOTE — Care Management Note (Signed)
Case Management Note  Patient Details  Name: Jameison Haji MRN: [id number] Date of Birth: [birth year]  Subjective/Objective:                  Discharge Readiness Return to top of Pneumonia RRG - ISC  Discharge readiness is indicated by patient meeting Recovery Milestones, including ALL of the following: ? Hemodynamic stability no bp-80/54 ? Tachypnea absent rr=35 on vent ? Hypoxemia absent n/a on vent at 100% Fi02 ? Afebrile, or temperature acceptable for next level of care  ? No temp  ? Oxygen absent or at baseline need on full vent support ? Mental status at baseline sedated ? Antibiotic regimen acceptable for next level of care ? Iv solu-medrol,iv ancef, iv precedex, , iv levophed ? Ambulatory ? Oral hydration, medications, and diet ? Npo, see above ? Level of care =-icu   Action/Plan: Following for progression of care. Following for case management needs.  Expected Discharge Date:  12/08/18               Expected Discharge Plan:     In-House Referral:     Discharge planning Services     Post Acute Care Choice:    Choice offered to:     DME Arranged:    DME Agency:     HH Arranged:    HH Agency:     Status of Service:     If discussed at [employer], dates discussed:    Additional Comments:  Leeroy Cha, RN 12/09/2018, 10:33 AM

## 2018-12-09 NOTE — Progress Notes (Signed)
CPT attempted via bed, but terminated after a minute due to increased agitation.  Rn notified.

## 2018-12-09 NOTE — ED Provider Notes (Signed)
Asked to intubate patient by E-Link attending due to respiratory failure not adequately treated with BiPAP.  Procedure Name: Intubation Date/Time: 12/09/2018 6:49 AM Performed by: Lareta Bruneau, MD Pre-anesthesia Checklist: Patient identified, Patient being monitored, Emergency Drugs available, Timeout performed and Suction available Oxygen Delivery Method: Non-rebreather mask Preoxygenation: Pre-oxygenation with 100% oxygen Induction Type: Rapid sequence Ventilation: Mask ventilation without difficulty Laryngoscope Size: Glidescope Grade View: Grade I Tube type: Subglottic suction tube Tube size: 7.5 mm Number of attempts: 1 Placement Confirmation: ETT inserted through vocal cords under direct vision,  CO2 detector and Breath sounds checked- equal and bilateral Secured at: 24 cm Tube secured with: ETT holder Dental Injury: Teeth and Oropharynx as per pre-operative assessment  Comments: CXR shows ETT tip proximal to carina.          Jasmin Trumbull, Jenny Reichmann, MD 12/09/18 [id number]

## 2018-12-09 NOTE — Progress Notes (Addendum)
Shift event: Notified by RN regarding pt w/ increased WOB and c/o SOB. RR RN was paged and responded to bedside. RRT reported pt became very dypsnic and anxious following a breathing treatment. Pt has been sating WNL on 3L [place], however sats dropped briefly to the 70's so pt was trialed on CPAP but was unable to tolerate more than 15 minutes. RR RN placed order for stat PCXR and RRT transitioned pt to HFNC ([place]) at 8L/min. Order placed for Morphine 2 mg IV. At bedside pt noted to be awake, alert and sitting up in bed. Much improved but remains mildly tachypnic. However does appear comfortable and calm after IV Morphine. Pt states is more comfortable. BBS w/ expiratory wheezes and crackles R>L. Currently sating >95% on HFNC at 8L. PCXR reveals 1. Progressed and Severe Bilateral Pneumonia since the CT on 12/06/2018. 2. Underlying fibrothorax, no definite acute pleural effusion. Assessment/Plan: 1. Acute on chronic hypoxic respiratory failure: In setting of pt w/ worsening CAP, h/o (L) hydropneumothorax, bronchiectasis, allergic pulmonary aspergillosis and COPD Gold 4. Pt acute repiratory distress improved at this time. Discussed pt w/ Dr Lucile Shutters w/ [facility] concerning pt's increased potential for further deterioration. Decision made to continue to attempt to wean 02 and allow pt to remain on floor since improved at present w/ low threshold to transfer back to SDU if further deterioration.   Jeryl Columbia, NP-C [facility] Pager [phone]  CRITICAL CARE Performed by: Jeryl Columbia   Total critical care time: 60 minutes  Critical care time was exclusive of separately billable procedures and treating other patients.  Critical care was necessary to treat or prevent imminent or life-threatening deterioration.  Critical care was time spent personally by me on the following activities: development of treatment plan with patient and/or surrogate as well as nursing, discussions with consultants,  evaluation of patient's response to treatment, examination of patient, obtaining history from patient or surrogate, ordering and performing treatments and interventions, ordering and review of laboratory studies, ordering and review of radiographic studies, pulse oximetry and re-evaluation of patient's condition.

## 2018-12-09 NOTE — Progress Notes (Signed)
Rapid response called d/t patient is having respiratory distress, pt stated that he cant get enough air. Pt is alert and oriented, on nasal cannula at 3L, tachypneic with use of accessory muscle, VS checked ( see flowsheet); Respiratory therapist were also called in the room. Will continue to monitor patient

## 2018-12-09 NOTE — Progress Notes (Signed)
Patient ID: [name] [name], male   DOB: August 12, 1961, 58 y.o.   MRN: [id number]         [facility] for Infectious Disease  Date of Admission:  12/06/2018    Total days of antibiotics 4        Day 2 cefazolin         ASSESSMENT: He had transient MSSA bacteremia and right lower lobe pneumonia upon admission.  Repeat blood cultures are negative and there is no evidence of endocarditis by TTE done yesterday.  He has had marked worsening of his respiratory status and infiltrates overnight requiring reintubation.  I still believe that MSSA is is the most likely cause of his pneumonia but given his immunocompromised status and history of aspergillus in sputum last summer we will need to consider other possibilities.  I will order sputum cultures, Aspergillus IgG and IgE antibodies, galactomannan and 1, 3 beta D glucan levels.  If he fails to improve we will need to consider empiric broadening of antibacterial therapy and the possibility of antifungal therapy with voriconazole.  PLAN: 1. Continue cefazolin for now 2. Sputum culture 3. Aspergillus IgG and IgE antibodies 4. Serum galactomannan and 1, 3 beta D glucan levels  Principal Problem:   Bacteremia due to methicillin susceptible Staphylococcus aureus (MSSA) Active Problems:   Community acquired pneumonia   Pleural effusion, left   COPD GOLD IV criteria but never smoked    Multiple myeloma (HCC)   Acute on chronic respiratory failure with hypoxia (HCC)   Grade II diastolic dysfunction   Protein-calorie malnutrition, severe   Sepsis ([place])   Alpha-1-antitrypsin deficiency ([place])   Bronchiectasis ([place])   Acute respiratory distress   Scheduled Meds: . acyclovir  400 mg Oral Daily  . aspirin  81 mg Oral Daily  . chlorhexidine gluconate (MEDLINE KIT)  15 mL Mouth Rinse BID  . docusate  50 mg Per Tube QHS  . enoxaparin (LOVENOX) injection  30 mg Subcutaneous Q24H  . feeding supplement (ENSURE ENLIVE)  237 mL Oral BID BM  . fluticasone  furoate-vilanterol  1 puff Inhalation Daily   And  . umeclidinium bromide  1 puff Inhalation Daily  . mouth rinse  15 mL Mouth Rinse 10 times per day  . methylPREDNISolone (SOLU-MEDROL) injection  40 mg Intravenous Q8H  . multivitamin  15 mL Per Tube Daily  . pantoprazole sodium  40 mg Per Tube Daily   Continuous Infusions: .  ceFAZolin (ANCEF) IV Stopped (12/09/18 1454)  . dexmedetomidine (PRECEDEX) IV infusion 0.9 mcg/kg/hr (12/09/18 1500)  . norepinephrine (LEVOPHED) Adult infusion 4 mcg/min (12/09/18 1537)   PRN Meds:.acetaminophen **OR** acetaminophen, bisacodyl, fentaNYL (SUBLIMAZE) injection, fentaNYL (SUBLIMAZE) injection, guaiFENesin, labetalol, ondansetron **OR** ondansetron (ZOFRAN) IV, oxyCODONE-acetaminophen, sennosides, simethicone   SUBJECTIVE: He developed hypoxia and respiratory distress last night after being transferred to the floor.  He was moved back to the ICU and intubated.  Chest x-ray shows progressive bilateral infiltrates.  Review of Systems: Review of Systems  Unable to perform ROS: Intubated    No Known Allergies  OBJECTIVE: Vitals:   12/09/18 1100 12/09/18 1140 12/09/18 1200 12/09/18 1500  BP: (!) 157/79 (!) 158/70 (!) 143/72 110/70  Pulse: 83 81 80 85  Resp: (!) 22 (!) 22 (!) 22 (!) 23  Temp:   97.9 F (36.6 C)   TempSrc:   Oral   SpO2: 100% 100% 97% 95%  Weight:      Height:       Body mass index is 18.9  kg/m.  Physical Exam Constitutional:      Comments: He just recently received pain medication.  He is sedated the ventilator.  Cardiovascular:     Rate and Rhythm: Normal rate and regular rhythm.     Heart sounds: No murmur.  Pulmonary:     Comments: Coarse breath sounds bilaterally anteriorly.    Lab Results Lab Results  Component Value Date   WBC 10.8 (H) 12/08/2018   HGB 9.7 (L) 12/08/2018   HCT 30.1 (L) 12/08/2018   MCV 75.6 (L) 12/08/2018   PLT 88 (L) 12/08/2018    Lab Results  Component Value Date   CREATININE 0.90  12/09/2018   BUN 33 (H) 12/09/2018   NA 141 12/09/2018   K 5.4 (H) 12/09/2018   CL 116 (H) 12/09/2018   CO2 21 (L) 12/09/2018    Lab Results  Component Value Date   ALT 74 (H) 12/09/2018   AST 157 (H) 12/09/2018   ALKPHOS 45 12/09/2018   BILITOT 0.5 12/09/2018     Microbiology: Recent Results (from the past 240 hour(s))  Blood culture (routine x 2)     Status: Abnormal   Collection Time: 12/06/18 11:53 AM  Result Value Ref Range Status   Specimen Description   Final    BLOOD FOREARM Performed at [facility], [place] [address] [place], [place] [address]    Special Requests   Final    BOTTLES DRAWN AEROBIC AND ANAEROBIC Blood Culture adequate volume Performed at [facility] [address], [place], [place] [address]    Culture  Setup Time   Final    GRAM POSITIVE COCCI ANAEROBIC BOTTLE ONLY CRITICAL RESULT CALLED TO, READ BACK BY AND VERIFIED WITH: T GREEN PHARMD 1514 12/07/18 A BROWNING Performed at [facility], [place] [address], [place], [place] [address]    Culture STAPHYLOCOCCUS AUREUS (A)  Final   Report Status 12/09/2018 FINAL  Final   Organism ID, Bacteria STAPHYLOCOCCUS AUREUS  Final      Susceptibility   Staphylococcus aureus - MIC*    CIPROFLOXACIN <=0.5 SENSITIVE Sensitive     ERYTHROMYCIN >=8 RESISTANT Resistant     GENTAMICIN <=0.5 SENSITIVE Sensitive     OXACILLIN 0.5 SENSITIVE Sensitive     TETRACYCLINE <=1 SENSITIVE Sensitive     VANCOMYCIN <=0.5 SENSITIVE Sensitive     TRIMETH/SULFA <=10 SENSITIVE Sensitive     CLINDAMYCIN <=0.25 SENSITIVE Sensitive     RIFAMPIN <=0.5 SENSITIVE Sensitive     Inducible Clindamycin NEGATIVE Sensitive     * STAPHYLOCOCCUS AUREUS  Blood culture (routine x 2)     Status: None (Preliminary result)   Collection Time: 12/06/18 11:53 AM  Result Value Ref Range Status   Specimen Description   Final    BLOOD RIGHT FOREARM Performed at [facility], [place] [address] [place], [place]  [address]    Special Requests   Final    BOTTLES DRAWN AEROBIC AND ANAEROBIC Blood Culture adequate volume Performed at [facility] [address] [place], [place] [address]    Culture   Final    NO GROWTH 3 DAYS Performed at [facility], [place] [address] [place], [place] [address]    Report Status PENDING  Incomplete  Blood Culture ID Panel (Reflexed)     Status: Abnormal   Collection Time: 12/06/18 11:53 AM  Result Value Ref Range Status   Enterococcus species NOT DETECTED NOT DETECTED Final   Listeria  monocytogenes NOT DETECTED NOT DETECTED Final   Staphylococcus species DETECTED (A) NOT DETECTED Final    Comment: CRITICAL RESULT CALLED TO, READ BACK BY AND VERIFIED WITH: T GREEN PHARMD 1514 12/07/18 A BROWNING    Staphylococcus aureus (BCID) DETECTED (A) NOT DETECTED Final    Comment: Methicillin (oxacillin) susceptible Staphylococcus aureus (MSSA). Preferred therapy is anti staphylococcal beta lactam antibiotic (Cefazolin or Nafcillin), unless clinically contraindicated. CRITICAL RESULT CALLED TO, READ BACK BY AND VERIFIED WITH: T GREEN PHARMD 1514 12/07/18 A BROWNING    Methicillin resistance NOT DETECTED NOT DETECTED Final   Streptococcus species NOT DETECTED NOT DETECTED Final   Streptococcus agalactiae NOT DETECTED NOT DETECTED Final   Streptococcus pneumoniae NOT DETECTED NOT DETECTED Final   Streptococcus pyogenes NOT DETECTED NOT DETECTED Final   Acinetobacter baumannii NOT DETECTED NOT DETECTED Final   Enterobacteriaceae species NOT DETECTED NOT DETECTED Final   Enterobacter cloacae complex NOT DETECTED NOT DETECTED Final   Escherichia coli NOT DETECTED NOT DETECTED Final   Klebsiella oxytoca NOT DETECTED NOT DETECTED Final   Klebsiella pneumoniae NOT DETECTED NOT DETECTED Final   Proteus species NOT DETECTED NOT DETECTED Final   Serratia marcescens NOT DETECTED NOT DETECTED Final   Haemophilus influenzae NOT DETECTED NOT DETECTED Final    Neisseria meningitidis NOT DETECTED NOT DETECTED Final   Pseudomonas aeruginosa NOT DETECTED NOT DETECTED Final   Candida albicans NOT DETECTED NOT DETECTED Final   Candida glabrata NOT DETECTED NOT DETECTED Final   Candida krusei NOT DETECTED NOT DETECTED Final   Candida parapsilosis NOT DETECTED NOT DETECTED Final   Candida tropicalis NOT DETECTED NOT DETECTED Final    Comment: Performed at [facility], [place] [address] [place], [place] [address]  MRSA PCR Screening     Status: None   Collection Time: 12/06/18  5:41 PM  Result Value Ref Range Status   MRSA by PCR NEGATIVE NEGATIVE Final    Comment:        The GeneXpert MRSA Assay (FDA approved for NASAL specimens only), is one component of a comprehensive MRSA colonization surveillance program. It is not intended to diagnose MRSA infection nor to guide or monitor treatment for MRSA infections. Performed at [facility], [place] [address], [place], [place] [address]   Culture, blood (routine x 2)     Status: None (Preliminary result)   Collection Time: 12/07/18  6:34 PM  Result Value Ref Range Status   Specimen Description   Final    BLOOD RIGHT HAND Performed at [facility] [address], [place], [place] [address]    Special Requests   Final    BOTTLES DRAWN AEROBIC ONLY Blood Culture results may not be optimal due to an inadequate volume of blood received in culture bottles Performed at [facility] [address], [place], [place] [address]    Culture   Final    NO GROWTH 2 DAYS Performed at [facility] [address], [place], [place] [address]    Report Status PENDING  Incomplete  Culture, blood (routine x 2)     Status: None (Preliminary result)   Collection Time: 12/07/18  6:35 PM  Result Value Ref Range Status   Specimen Description   Final    BLOOD RIGHT ARM Performed at [facility] [address] [place],   [address]    Special Requests   Final    BOTTLES DRAWN AEROBIC ONLY Blood Culture results may  not be optimal due to an inadequate volume of blood received in culture bottles Performed at [facility] [address], [place], [place] [address]    Culture   Final    NO GROWTH 2 DAYS Performed at [facility] [address], [place], [place] [address]    Report Status PENDING  Incomplete    Michel Bickers, MD [facility] [place] [facility] [phone] pager   [phone] cell 12/09/2018, 5:01 PM

## 2018-12-09 NOTE — Progress Notes (Deleted)
Subjective:    Patient ID: [name] [name], male    DOB: October 09, 1961, 58 y.o.   MRN: [id number]  Synopsis: Referred in 2018 for evaluation of pulmonary hypertension in the setting of chronic left-sided hydropneumothorax and bronchiectasis.  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  Apparently this was originally treated as an empyema with VATS decortication in 2016.  CT imaging since then confirms bronchiectasis in the right lung and pulmonary function testing shows severe airflow obstruction.  In 2017 01/2017 he has had evidence of exertional hypoxemia.  Compliance with medications and oxygen use has been a challenge.  HPI No chief complaint on file.  ***  Past Medical History:  Diagnosis Date  . Acute on chronic respiratory failure ([place]) 02/07/2018  . Hypertension   . Kidney stones   . Multiple myeloma ([place])   . Obstructive lung disease (generalized) ([place])   . Pulmonary hypertension ([place])        Review of Systems  Constitutional: Negative for fever and unexpected weight change.  HENT: Negative for congestion, dental problem, ear pain, nosebleeds, postnasal drip, rhinorrhea, sinus pressure, sneezing, sore throat and trouble swallowing.   Eyes: Negative for redness and itching.  Respiratory: Positive for cough, chest tightness and shortness of breath. Negative for wheezing.   Cardiovascular: Negative for palpitations and leg swelling.  Gastrointestinal: Negative for nausea and vomiting.  Genitourinary: Negative for dysuria.  Musculoskeletal: Negative for joint swelling.  Skin: Negative for rash.  Neurological: Negative for headaches.  Hematological: Does not bruise/bleed easily.  Psychiatric/Behavioral: Negative for dysphoric mood. The patient is not nervous/anxious.        Objective:   Physical Exam  There were no vitals filed for this visit.RA  Ambulated 500 feet on room air and O2 saturation remained at 95%  ***  RHC: 06/2017 RA = 3, RV = 59/6    PA = 62/23 (40) PCW = 6 Fick cardiac output/index = 4.2/2.9 PVR = 8.1 WU Ao sat = 93%  PA sat = 67%, 68%  Echo: August 2018 LVEF 60-65%, D shaped interventricular septum suggestive of RV pressure volume overload, moderately dilated right ventricle with moderately depressed systolic function, PA pressure estimate 59 mmHg, RA dilation, valvues OK, moderate Tricuspid regurgitation  Chest imaging: March 2019 CT chest shows a chronic appearing hydropneumothorax on the left, significant bronchiectasis in the right lower lobe August 2018 chest x-ray images independently reviewed the prominent pulmonary vasculature, scar versus consolidation right lung, chronic appearing left-sided pleural effusion with apical pneumothorax4 January 2018 CT angiogram chest images independently reviewed showing chronic appearing hydropneumothorax in the left lung, scattered consolidation patches in the right upper lobe and right lower lobe, there is also bronchiectasis changes in the right lower lobe.   Pulmonary function test: April 2017 ratio 45%, FEV1 0.56 L 21% predicted, FVC 1.25 L 37% predicted, total lung capacity 2.88 L 58% predicted, DLCO 10.30 54% predicted  Micro: 11/2016 Sputum: normal flora 05/2018 Sputum AFB negative 05/2018 Sputum fungus: Aspergillus fumigatus 05/2018 Sputum culture: no growth  Labs: 04/2016 Alpha 1 MZ, normal level; total IgG level severely elevated, IgA normal, IgM low, IgE 2,066 August 2019 Aspergillus specific IgE testing showed Aspergillus fumigatus IgE 8.02 kilounits/L, all other units negative  CBC    Component Value Date/Time   WBC 10.8 (H) 12/08/2018 0309   RBC 3.98 (L) 12/08/2018 0309   HGB 9.7 (L) 12/08/2018 0309   HGB 10.7 (L) 04/07/2018 1429   HGB 9.5 (L) 03/05/2018 1206  HGB 13.1 11/07/2017 1014   HCT 30.1 (L) 12/08/2018 0309   HCT 32.3 (L) 03/05/2018 1206   HCT 39.7 11/07/2017 1014   PLT 88 (L) 12/08/2018 0309   PLT 637 (H) 04/07/2018 1429   PLT 474 (H)  03/05/2018 1206   MCV 75.6 (L) 12/08/2018 0309   MCV 82 03/05/2018 1206   MCV 87.6 11/07/2017 1014   MCH 24.4 (L) 12/08/2018 0309   MCHC 32.2 12/08/2018 0309   RDW 16.1 (H) 12/08/2018 0309   RDW 20.5 (H) 03/05/2018 1206   RDW 14.5 11/07/2017 1014   LYMPHSABS 1.0 12/02/2018 1418   LYMPHSABS 1.5 03/05/2018 1206   LYMPHSABS 1.7 11/07/2017 1014   MONOABS 0.4 12/02/2018 1418   MONOABS 0.8 11/07/2017 1014   EOSABS 0.2 12/02/2018 1418   EOSABS 0.4 03/05/2018 1206   BASOSABS 0.1 12/02/2018 1418   BASOSABS 0.1 03/05/2018 1206   BASOSABS 0.0 11/07/2017 1014   Records from his visit with oncology for multiple myeloma on October 3 reviewed, he was continued on Velcade with weekly labs.     Assessment & Plan:    No diagnosis found.   Discussion: ***  No current facility-administered medications for this visit.  No current outpatient medications on file.  Facility-Administered Medications Ordered in Other Visits:  .  acetaminophen (TYLENOL) tablet 650 mg, 650 mg, Oral, Q6H PRN **OR** acetaminophen (TYLENOL) suppository 650 mg, 650 mg, Rectal, Q6H PRN, Schorr, Rhetta Mura, NP .  acyclovir (ZOVIRAX) tablet 400 mg, 400 mg, Oral, Daily, Schorr, Rhetta Mura, NP, 400 mg at 12/08/18 1017 .  aspirin EC tablet 81 mg, 81 mg, Oral, Daily, Schorr, Rhetta Mura, NP, 81 mg at 12/08/18 1010 .  bisacodyl (DULCOLAX) suppository 10 mg, 10 mg, Rectal, Daily PRN, Ogan, Okoronkwo U, MD .  ceFAZolin (ANCEF) IVPB 2g/100 mL premix, 2 g, Intravenous, Q8H, Schorr, Rhetta Mura, NP, Last Rate: 200 mL/hr at 12/09/18 0658, 2 g at 12/09/18 0658 .  dexmedetomidine (PRECEDEX) 200 MCG/50ML (4 mcg/mL) infusion, 0-1.2 mcg/kg/hr, Intravenous, Continuous, Ogan, Okoronkwo U, MD, Last Rate: 4.39 mL/hr at 12/09/18 0829, 0.4 mcg/kg/hr at 12/09/18 0829 .  docusate sodium (COLACE) capsule 50 mg, 50 mg, Oral, QHS, Schorr, Rhetta Mura, NP, 50 mg at 12/08/18 2144 .  enoxaparin (LOVENOX) injection 30 mg, 30 mg, Subcutaneous, Q24H,  Schorr, Rhetta Mura, NP, 30 mg at 12/08/18 2144 .  feeding supplement (ENSURE ENLIVE) (ENSURE ENLIVE) liquid 237 mL, 237 mL, Oral, BID BM, Schorr, Rhetta Mura, NP, 237 mL at 12/08/18 1417 .  fentaNYL (SUBLIMAZE) injection 100 mcg, 100 mcg, Intravenous, Q15 min PRN, Frederik Pear, MD, 100 mcg at 12/09/18 0818 .  fentaNYL (SUBLIMAZE) injection 100 mcg, 100 mcg, Intravenous, Q2H PRN, Ogan, Okoronkwo U, MD .  fluticasone furoate-vilanterol (BREO ELLIPTA) 100-25 MCG/INH 1 puff, 1 puff, Inhalation, Daily, 1 puff at 12/08/18 0930 **AND** umeclidinium bromide (INCRUSE ELLIPTA) 62.5 MCG/INH 1 puff, 1 puff, Inhalation, Daily, Schorr, Rhetta Mura, NP, 1 puff at 12/08/18 0930 .  guaiFENesin (ROBITUSSIN) 100 MG/5ML solution 100 mg, 5 mL, Oral, Q4H PRN, Schorr, Rhetta Mura, NP, 100 mg at 12/08/18 2153 .  labetalol (NORMODYNE,TRANDATE) injection 5 mg, 5 mg, Intravenous, Q2H PRN, Schorr, Rhetta Mura, NP, 5 mg at 12/08/18 2321 .  methylPREDNISolone sodium succinate (SOLU-MEDROL) 125 mg/2 mL injection 60 mg, 60 mg, Intravenous, Q6H, Ogan, Okoronkwo U, MD .  multivitamin with minerals tablet 1 tablet, 1 tablet, Oral, Daily, Schorr, Rhetta Mura, NP, 1 tablet at 12/08/18 1010 .  norepinephrine (LEVOPHED) '4mg'$  in 259m premix  infusion, 0-40 mcg/min, Intravenous, Titrated, Eugenie Filler, MD .  ondansetron [facility]) tablet 4 mg, 4 mg, Oral, Q6H PRN **OR** ondansetron (ZOFRAN) injection 4 mg, 4 mg, Intravenous, Q6H PRN, Schorr, Rhetta Mura, NP .  oxyCODONE-acetaminophen (PERCOCET/ROXICET) 5-325 MG per tablet 1 tablet, 1 tablet, Oral, Q6H PRN, Schorr, Rhetta Mura, NP, 1 tablet at 12/08/18 2144 .  pantoprazole (PROTONIX) EC tablet 40 mg, 40 mg, Oral, Daily, Schorr, Rhetta Mura, NP, 40 mg at 12/08/18 0915 .  sennosides (SENOKOT) 8.8 MG/5ML syrup 5 mL, 5 mL, Per Tube, BID PRN, Ogan, Okoronkwo U, MD .  simethicone (MYLICON) chewable tablet 160 mg, 160 mg, Oral, QID PRN, Schorr, Rhetta Mura, NP, 160 mg at 12/08/18 2153 .   sodium chloride 0.9 % bolus 1,000 mL, 1,000 mL, Intravenous, Once, Rush Farmer, MD

## 2018-12-09 NOTE — Progress Notes (Addendum)
Patient was transferred to ICU per Md order for close monitoring; report given to Ms. Ginelle,RN; Family member(Rmah, Edison Simon) was informed regarding the transfer, verbalized understanding.

## 2018-12-09 NOTE — Progress Notes (Signed)
Transfer note: Notified by RR RN regarding pt once again w/ increased WOB and hypoxia s/p neb treatment. Pt very anxious and not responding as well to interventions as he did w/ episode a couple of hours ago. Will discontinue nebs for tonight. Will transfer to SDU. Discussed w/ Dr Lucile Shutters w/ [facility] L[facility]. Pt to be seen in consultation w/ PCCM in am unless further urgent needs arise. Appreciate PCCM recommendations and input. Will monitor closely on SDU.   Jeryl Columbia, NP-C [facility] Pager [phone]

## 2018-12-09 NOTE — Progress Notes (Signed)
Pharmacy Antibiotic Note  [name] [name] is a 58 y.o. male admitted on 12/06/2018 with sepsis with suspected pneumonia.  PMH significant for multiple myeloma, pulmonary HTN, COPD.  Presents with c/o fever, worsening cough and hemoptysis.  1/4 Blood cultures growing MSSA.  Pharmacy has been consulted for Cefazolin dosing.  Plan:  Continue Cefazolin 2g IV q8h  Dosage remains stable and need for further dosage adjustment appears unlikely at present.  Pharmacy will sign off at this time.  Please reconsult if a change in clinical status warrants re-evaluation of dosage.   Height: 5' (152.4 cm) Weight: 96 lb 12.5 oz (43.9 kg) IBW/kg (Calculated) : 50  Temp (24hrs), Avg:97.6 F (36.4 C), Min:97.4 F (36.3 C), Max:97.8 F (36.6 C)  Recent Labs  Lab 12/02/18 1418 12/06/18 1041 12/06/18 1153 12/06/18 1434 12/07/18 0343 12/08/18 0309 12/09/18 1245  WBC 5.7 16.1*  --   --  12.1* 10.8*  --   CREATININE 0.96 1.06  --   --  0.86 0.76 0.90  LATICACIDVEN  --   --  4.0* 3.2*  --   --   --     Estimated Creatinine Clearance: 56.9 mL/min (by C-G formula based on SCr of 0.9 mg/dL).    No Known Allergies  Antimicrobials this admission:  PTA Acyclovir >>  2/2 vanc >>  2/3 2/2 cefepime >> 2/3 2/3 Cefazolin >>  Dose adjustments this admission:   Microbiology results:  2/2 BCx: 1/4 (anaerobic bottle only): MSSA      2/3  BCID 1/4 Staph aureus, Staph sp, no resis Sputum: ordered, not collected 2/2 MRSA PCR: negative 2/2 Influenza a/b: neg/neg 2/3 BCx: ngtd  Thank you for allowing pharmacy to be a part of this patient's care.  Gretta Arab PharmD, BCPS Pager [phone] 12/09/2018 1:53 PM

## 2018-12-09 NOTE — Progress Notes (Signed)
CPT held at this time. Patient BP low and agitated.

## 2018-12-09 NOTE — Progress Notes (Signed)
NAME:  [name] [name], MRN:  [id number], DOB:  [birth year], LOS: 2 ADMISSION DATE:  12/06/2018, CONSULTATION DATE: 12/06/2018 REFERRING MD: Dr. 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  Admitted with dyspnea, cough, hemoptysis and chest x-ray evidence for R HCAP  History of present illness   58 year old never smoker with multiple myeloma being treated with Revlimid, Dexamethasone, and Velcade.  Followed in our office by Dr. 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  Also secondary pulmonary hypertension in the setting of all the above.  Managed on Trelegy, oxygen.   He is here with progressive cough, blood-tinged sputum, low-grade fever that began about 3 days prior to admission.  His last myeloma treatment was last Thursday.  Chest x-ray shows possible progression of right lower lobe airspace disease superimposed on his chronic changes.  He has been tachypneic and tachycardic, has an elevated lactate = 4.    Past Medical History   Past Medical History:  Diagnosis Date  . Acute on chronic respiratory failure ([place]) 02/07/2018  . Hypertension   . Kidney stones   . Multiple myeloma ([place])   . Obstructive lung disease (generalized) ([place])   . Pulmonary hypertension ([place])     Significant Hospital Events     Consults:  ID CCM  Procedures:  ETT 2/5>>> R IJ TLC 2/5>>>  Significant Diagnostic Tests:  CT chest 2/2 >>   Micro Data:  Resp 2/2 >> NTD Blood 2/2 >> STAPHYLOCOCCUS AUREUS resistant to erythromycin  Antimicrobials:  Vanco 2/2 >> 2/4 Cefepime 2/2 >>2/4 Ancef 2/4>>> Acyclovir 2/4>>>     Interim history/subjective:  Became hypotensive, lost his mental status and was intubated for airway protection overnight  Objective   Blood pressure (!) 80/61, pulse 75, temperature 97.7 F (36.5 C), temperature source Oral, resp. rate (!) 22, height 5' (1.524 m), weight 43.9 kg, SpO2 100 %.    Vent Mode: PRVC FiO2 (%):  [70 %-100 %] 100 % Set Rate:  [22 bmp] 22 bmp Vt Set:  [400 mL] 400 mL PEEP:  [5 cmH20] 5 cmH20 Plateau Pressure:  [34 cmH20-36 cmH20] 34 cmH20   Intake/Output Summary (Last 24 hours) at 12/09/2018 0931 Last data filed at 12/09/2018 0600 Gross per 24 hour  Intake 1692.65 ml  Output 520 ml  Net 1172.65 ml   Filed Weights   12/06/18 1722 12/09/18 0400  Weight: 36.9 kg 43.9 kg   Examination: General: Acute on chronically ill appearing male, sedate, on the vent, NAD HENT: [place]/AT, PERRL, EOM-I and MMM Lungs: Decreased BS diffusely Cardiovascular: IRIR, Nl S1/S2 and -M/R/G Abdomen: Soft, NT, ND and +BS Extremities: -edema and -tenderness Neuro: Sedate, unresponsive  I reviewed  Resolved Hospital Problem list   Discussed with PCCM-NP.  Assessment & Plan:   Acute on chronic hypoxemic respiratory failure with increased work of breathing - Maintain on full vent support - ABG - Adjust vent for ABG - Hold off weaning for today - May need to have a discussion regarding plan of care  Right lower lobe healthcare associated pneumonia.  Probably bacterial and due to his known bronchiectasis, immunosuppression due to chemotherapy. 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  I think this will need to be evaluated with further imaging. - Cefazolin per ID - F/U on culture - ID consult appreciated it  Severe sepsis - KVO IVF - F/U on cultures - Antibiotics as above - Procal 2.88, ID following  Septic shock: - Levophed - IVF - Place TLC - Follow CVP  Bronchiectasis and COPD (alpha-1 antitrypsin deficient) with acute  exacerbation - Start low dose solumedrol at 40 mg IV q8 - D/C prednisone - Bronchodilators ordered - F/U alpha 1 as outpatient - Pulmonary hygienes  Left hydropneumothorax, remote VATS decortication for empyema - No need for change in management at this point  Multiple myeloma - Agree with holding Revlimid for now - Acyclovir ordered  PCCM will continue to follow  Best practice:  Diet: Regular Pain/Anxiety/Delirium protocol (if indicated): N/A VAP protocol (if indicated): N/A DVT prophylaxis: Heparin GI prophylaxis: N/A Glucose control: NA Mobility: Out of bed ad lib. Code Status: Full code Family Communication: No family available 2/2 Disposition: SDU  Labs   CBC: Recent Labs  Lab 12/02/18 1418 12/06/18 1041 12/07/18 0343 12/08/18 0309  WBC 5.7 16.1* 12.1* 10.8*  NEUTROABS 4.1  --   --   --   HGB 11.5* 12.6* 10.6* 9.7*  HCT 36.7* 39.0 34.5* 30.1*  MCV 79.4* 77.1* 81.4 75.6*  PLT 326 142* 112* 88*    Basic Metabolic Panel: Recent Labs  Lab 12/02/18 1418 12/06/18 1041 12/07/18 0343 12/08/18 0309  NA 141 130* 139 139  K 3.8 3.4* 3.5 3.2*  CL 102 94* 114* 116*  CO2 30 23 18* 17*  GLUCOSE 90 162* 156* 133*  BUN _0 CREATININE 0.96 1.06 0.86 0.76  CALCIUM 8.8* 7.5* 5.9* 6.9*  MG  --   --   --  1.9  PHOS  --   --   --  1.8*   GFR: Estimated Creatinine Clearance: 64 mL/min (by C-G formula based on SCr of 0.76 mg/dL). Recent Labs  Lab 12/02/18 1418 12/06/18 1041 12/06/18 1153 12/06/18 1434 12/07/18 0343 12/08/18 0309 12/09/18 0446  PROCALCITON  --   --   --   --  3.89 2.88 1.59  WBC 5.7 16.1*  --   --  12.1* 10.8*  --   LATICACIDVEN  --   --  4.0* 3.2*  --   --   --     Liver Function Tests: Recent Labs  Lab 12/02/18 1418  AST 18  ALT 12  ALKPHOS 70  BILITOT 0.3  PROT 7.4  ALBUMIN 3.4*   No results for input(s): LIPASE, AMYLASE in the last 168 hours. No results for input(s): AMMONIA in the last 168 hours.  ABG    Component  Value Date/Time   PHART 7.202 (L) 12/09/2018 0802   PCO2ART 54.3 (H) 12/09/2018 0802   PO2ART 195 (H) 12/09/2018 0802   HCO3 20.5 12/09/2018 0802   TCO2 32 06/24/2016 0902   ACIDBASEDEF 7.0 (H) 12/09/2018 0802   O2SAT 98.7 12/09/2018 0802     Coagulation Profile: No results for input(s): INR, PROTIME in the last 168 hours.  Cardiac Enzymes: No results for input(s): CKTOTAL, CKMB, CKMBINDEX, TROPONINI in the last 168 hours.  HbA1C: No results found for: HGBA1C  CBG: No results for input(s): GLUCAP in the last 168 hours.  Review of Systems:   Some nausea, poor p.o. intake, emesis for 2 to 3 days Cough with mid chest discomfort that is somewhat musculoskeletal, productive of yellow-tinged with blood  Low-grade fever No diarrhea  Past Medical History  He,  has a past medical history of Acute on chronic respiratory failure ([place]) (02/07/2018), Hypertension, Kidney stones, Multiple myeloma ([place]), Obstructive lung disease (generalized) ([place]), and Pulmonary hypertension ([place]).   Surgical History    Past Surgical History:  Procedure Laterality Date  . CARDIAC CATHETERIZATION N/A 01/18/2016   Procedure: Right Heart Cath;  Surgeon: Jolaine Artist, MD;  Location: [facility];  Service: Cardiovascular;  Laterality: N/A;  . CARDIAC CATHETERIZATION N/A 06/24/2016   Procedure: Right Heart Cath;  Surgeon: Jolaine Artist, MD;  Location: [facility];  Service: Cardiovascular;  Laterality: N/A;  . IR RADIOLOGIST EVAL & MGMT  05/27/2018  . IR RADIOLOGIST EVAL & MGMT  09/01/2018  . KIDNEY SURGERY    . VIDEO ASSISTED THORACOSCOPY (VATS)/DECORTICATION Left 2003  . VIDEO ASSISTED THORACOSCOPY (VATS)/DECORTICATION Left 10/02/2015   Procedure: VIDEO ASSISTED THORACOSCOPY (VATS)/DECORTICATION and drainage of chronic empyena;  Surgeon: [name], MD;  Location: [facility];  Service: Thoracic;  Laterality: Left;  Marland Kitchen VIDEO BRONCHOSCOPY N/A 10/02/2015   Procedure: VIDEO BRONCHOSCOPY;   Surgeon: [name], MD;  Location: [facility] [place];  Service: Thoracic;  Laterality: N/A;     Social History   reports that he has never smoked. He has never used smokeless tobacco. He reports current alcohol use. He reports that he does not use drugs.   Family History   His family history includes Hypertension in an other family member.   Allergies No Known Allergies   Home Medications  Prior to Admission medications   Medication Sig Start Date End Date Taking? Authorizing Provider  acyclovir (ZOVIRAX) 400 MG tablet Take 1 tablet (400 mg total) by mouth daily. 11/18/18  Yes Brunetta Genera, MD  aspirin EC 81 MG tablet Take 1 tablet (81 mg total) by mouth daily. 11/18/18  Yes Brunetta Genera, MD  dexamethasone (DECADRON) 4 MG tablet Take 5 tablets (20 mg total) by mouth once a week. 11/18/18  Yes Brunetta Genera, MD  docusate sodium (COLACE) 50 MG capsule Take 1 capsule (50 mg total) by mouth daily. 03/06/18  Yes [name], PA-C  Fluticasone-Umeclidin-Vilant (TRELEGY ELLIPTA) 100-62.5-25 MCG/INH AEPB Inhale 1 puff into the lungs daily. 07/31/18  Yes Tanda Rockers, MD  lenalidomide (REVLIMID) 25 MG capsule Take 1 capsule (25 mg total) by mouth daily. Take for 21 days and take 7 days off. Authorization#[id number] 10/19/18 11/18/18  Yes Brunetta Genera, MD  omeprazole (PRILOSEC) 40 MG capsule Take 1 capsule (40 mg total) by mouth daily. 08/20/18  Yes Brunetta Genera, MD  oxyCODONE-acetaminophen (PERCOCET/ROXICET) 5-325 MG tablet Take 1 tablet by mouth every 6 (six) hours as needed for severe pain. 08/27/18  Yes Brunetta Genera, MD  OXYGEN 3L BEDTIME ONLY   Yes [provider]  PROVENTIL HFA 108 (90 Base) MCG/ACT inhaler INHALE 2 PUFFS EVERY 6 HOURS AS NEEDED FOR WHEEZING OR SHORTNESS OF BREATH. 10/20/18  Yes Juanito Doom, MD  guaiFENesin (MUCINEX) 600 MG 12 hr tablet Take 2 tablets (1,200 mg total) by mouth 2 (two) times daily. Patient not taking:  Reported on 12/06/2018 09/05/18   Oswald Hillock, MD  nortriptyline (PAMELOR) 25 MG capsule Take 1 capsule (25 mg total) by mouth at bedtime. Patient not taking: Reported on 12/06/2018 08/20/18   Brunetta Genera, MD  ondansetron (ZOFRAN) 8 MG tablet Take 1 tablet (8 mg total) by mouth every 8 (eight) hours as  needed for nausea. Patient not taking: Reported on 12/06/2018 09/23/18   Brunetta Genera, MD    The patient is critically ill with multiple organ systems failure and requires high complexity decision making for assessment and support, frequent evaluation and titration of therapies, application of advanced monitoring technologies and extensive interpretation of multiple databases.   Critical Care Time devoted to patient care services described in this note is  33  Minutes. This time reflects time of care of this signee Dr Jennet Maduro. This critical care time does not reflect procedure time, or teaching time or supervisory time of PA/NP/Med student/Med Resident etc but could involve care discussion time.  Rush Farmer, M.D. [facility] Pulmonary/Critical Care Medicine. Pager: [phone]. After hours pager: [phone].

## 2018-12-09 NOTE — Progress Notes (Signed)
OT Cancellation Note  Patient Details Name: [name] [name] MRN: [id number] DOB: [birth year]   Cancelled Treatment:    Reason Eval/Treat Not Completed: Other (comment). Pt on vent. Will check back another day.  Lund 12/09/2018, 8:58 AM  Lesle Chris, OTR/L Acute Rehabilitation Services [phone] [facility] pager [phone] office 12/09/2018

## 2018-12-10 ENCOUNTER — Inpatient Hospital Stay (HOSPITAL_COMMUNITY): Payer: Medicare Other

## 2018-12-10 LAB — BLOOD GAS, ARTERIAL
Acid-base deficit: 0.4 mmol/L (ref 0.0–2.0)
Bicarbonate: 22.8 mmol/L (ref 20.0–28.0)
Drawn by: 232811
FIO2: 40
O2 Saturation: 91.6 %
PEEP: 5 cmH2O
Patient temperature: 98.6
RATE: 22 resp/min
VT: 400 mL
pCO2 arterial: 33.6 mmHg (ref 32.0–48.0)
pH, Arterial: 7.447 (ref 7.350–7.450)
pO2, Arterial: 65.3 mmHg — ABNORMAL LOW (ref 83.0–108.0)

## 2018-12-10 LAB — MAGNESIUM: MAGNESIUM: 2.2 mg/dL (ref 1.7–2.4)

## 2018-12-10 LAB — BASIC METABOLIC PANEL
Anion gap: 6 (ref 5–15)
BUN: 34 mg/dL — ABNORMAL HIGH (ref 6–20)
CO2: 20 mmol/L — ABNORMAL LOW (ref 22–32)
Calcium: 7.2 mg/dL — ABNORMAL LOW (ref 8.9–10.3)
Chloride: 118 mmol/L — ABNORMAL HIGH (ref 98–111)
Creatinine, Ser: 1 mg/dL (ref 0.61–1.24)
GFR calc Af Amer: >60 mL/min (ref >60)
GFR calc non Af Amer: >60 mL/min (ref >60)
Glucose, Bld: 155 mg/dL — ABNORMAL HIGH (ref 70–99)
Potassium: 5.4 mmol/L — ABNORMAL HIGH (ref 3.5–5.1)
Sodium: 144 mmol/L (ref 135–145)

## 2018-12-10 LAB — CBC
HCT: 31.1 % — ABNORMAL LOW (ref 39.0–52.0)
Hemoglobin: 9.7 g/dL — ABNORMAL LOW (ref 13.0–17.0)
MCH: 24.7 pg — ABNORMAL LOW (ref 26.0–34.0)
MCHC: 31.2 g/dL (ref 30.0–36.0)
MCV: 79.1 fL — ABNORMAL LOW (ref 80.0–100.0)
Platelets: 62 10*3/uL — ABNORMAL LOW (ref 150–400)
RBC: 3.93 MIL/uL — ABNORMAL LOW (ref 4.22–5.81)
RDW: 16.8 % — ABNORMAL HIGH (ref 11.5–15.5)
WBC: 3.4 10*3/uL — ABNORMAL LOW (ref 4.0–10.5)
nRBC: 0.9 % — ABNORMAL HIGH (ref 0.0–0.2)

## 2018-12-10 LAB — GLUCOSE, CAPILLARY
Glucose-Capillary: 148 mg/dL — ABNORMAL HIGH (ref 70–99)
Glucose-Capillary: 166 mg/dL — ABNORMAL HIGH (ref 70–99)
Glucose-Capillary: 177 mg/dL — ABNORMAL HIGH (ref 70–99)
Glucose-Capillary: 192 mg/dL — ABNORMAL HIGH (ref 70–99)

## 2018-12-10 LAB — PHOSPHORUS: Phosphorus: 1.3 mg/dL — ABNORMAL LOW (ref 2.5–4.6)

## 2018-12-10 MED ORDER — INSULIN ASPART 100 UNIT/ML ~~LOC~~ SOLN
2.0000 [IU] | SUBCUTANEOUS | Status: DC
  Administered 2018-12-10 – 2018-12-11 (×3): 4 [IU] via SUBCUTANEOUS
  Administered 2018-12-11 (×2): 2 [IU] via SUBCUTANEOUS
  Administered 2018-12-11 – 2018-12-12 (×6): 4 [IU] via SUBCUTANEOUS
  Administered 2018-12-12: 2 [IU] via SUBCUTANEOUS
  Administered 2018-12-12 – 2018-12-13 (×4): 4 [IU] via SUBCUTANEOUS

## 2018-12-10 MED ORDER — GUAIFENESIN 100 MG/5ML PO SOLN: 5.0000 mL | ORAL | Status: DC | PRN

## 2018-12-10 MED ORDER — VITAL AF 1.2 CAL PO LIQD
1000.0000 mL | ORAL | Status: DC
  Administered 2018-12-10: 1000 mL

## 2018-12-10 MED ORDER — ONDANSETRON HCL 4 MG/2ML IJ SOLN: 4.0000 mg | Freq: Four times a day (QID) | INTRAMUSCULAR | Status: DC | PRN

## 2018-12-10 MED ORDER — ACETAMINOPHEN 160 MG/5ML PO SOLN: 650.0000 mg | Freq: Four times a day (QID) | ORAL | Status: DC | PRN

## 2018-12-10 MED ORDER — VITAL HIGH PROTEIN PO LIQD: 1000.0000 mL | ORAL | Status: DC

## 2018-12-10 MED ORDER — ARFORMOTEROL TARTRATE 15 MCG/2ML IN NEBU
15.0000 ug | INHALATION_SOLUTION | Freq: Two times a day (BID) | RESPIRATORY_TRACT | Status: DC
  Administered 2018-12-10 – 2018-12-13 (×8): 15 ug via RESPIRATORY_TRACT
  Filled 2018-12-10 (×8): qty 2

## 2018-12-10 MED ORDER — SIMETHICONE 40 MG/0.6ML PO SUSP
160.0000 mg | Freq: Four times a day (QID) | ORAL | Status: DC | PRN
  Filled 2018-12-10: qty 2.4

## 2018-12-10 MED ORDER — ONDANSETRON HCL 4 MG PO TABS: 4.0000 mg | ORAL_TABLET | Freq: Four times a day (QID) | ORAL | Status: DC | PRN

## 2018-12-10 MED ORDER — METHYLPREDNISOLONE SODIUM SUCC 40 MG IJ SOLR
40.0000 mg | Freq: Three times a day (TID) | INTRAMUSCULAR | Status: DC
  Administered 2018-12-10 – 2018-12-15 (×15): 40 mg via INTRAVENOUS
  Filled 2018-12-10 (×15): qty 1

## 2018-12-10 MED ORDER — ACYCLOVIR 200 MG/5ML PO SUSP
400.0000 mg | Freq: Every day | ORAL | Status: DC
  Administered 2018-12-11 – 2018-12-12 (×2): 400 mg via ORAL
  Filled 2018-12-10 (×4): qty 10

## 2018-12-10 MED ORDER — IPRATROPIUM-ALBUTEROL 0.5-2.5 (3) MG/3ML IN SOLN
3.0000 mL | RESPIRATORY_TRACT | Status: DC | PRN
  Administered 2018-12-10 – 2018-12-13 (×2): 3 mL via RESPIRATORY_TRACT
  Filled 2018-12-10 (×2): qty 3

## 2018-12-10 MED ORDER — ASPIRIN 81 MG PO CHEW
81.0000 mg | CHEWABLE_TABLET | Freq: Every day | ORAL | Status: DC
  Administered 2018-12-11 – 2018-12-12 (×2): 81 mg
  Filled 2018-12-10 (×3): qty 1

## 2018-12-10 MED ORDER — OXYCODONE-ACETAMINOPHEN 5-325 MG PO TABS
1.0000 | ORAL_TABLET | Freq: Four times a day (QID) | ORAL | Status: DC | PRN
  Filled 2018-12-10: qty 1

## 2018-12-10 MED ORDER — ACETAMINOPHEN 650 MG RE SUPP: 650.0000 mg | RECTAL | Status: DC | PRN

## 2018-12-10 MED ORDER — SODIUM CHLORIDE 0.45 % IV SOLN
INTRAVENOUS | Status: AC
  Administered 2018-12-10 – 2018-12-11 (×3): via INTRAVENOUS

## 2018-12-10 MED ORDER — BUDESONIDE 0.5 MG/2ML IN SUSP
0.5000 mg | Freq: Two times a day (BID) | RESPIRATORY_TRACT | Status: DC
  Administered 2018-12-10 – 2018-12-19 (×19): 0.5 mg via RESPIRATORY_TRACT
  Filled 2018-12-10 (×19): qty 2

## 2018-12-10 NOTE — Progress Notes (Signed)
Nutrition Follow-up  DOCUMENTATION CODES:   Severe malnutrition in context of chronic illness, Underweight  INTERVENTION:  - Will order TF: Vital AF 1.2 @ 25 ml/hr to advance by 10 mL every 12 hours to reach goal rate of 55 ml/hr. - At goal rate, this regimen will provide 1584 kcal, 99 grams of protein, and 1070 ml free water. - Free water flush, if desired, to be per MD/NP.  Monitor magnesium, potassium, and phosphorus daily for at least 3 days, MD to replete as needed, as pt is at risk for refeeding syndrome given severe malnutrition, poor PO intake PTA, current hypophosphatemia.    NUTRITION DIAGNOSIS:   Severe Malnutrition related to chronic illness, cancer and cancer related treatments as evidenced by moderate fat depletion, severe fat depletion, moderate muscle depletion, severe muscle depletion. -ongoing  GOAL:   Patient will meet greater than or equal to 90% of their needs -unmet  MONITOR:   Vent status, TF tolerance, Weight trends, Labs  REASON FOR ASSESSMENT:   Consult Enteral/tube feeding initiation and management  ASSESSMENT:   58 year-old male with past medical history significant for multiple myeloma on Revlimid,  pulmonary HTN, chronic hypoxic respiratory failure, chronic L-sided hydropneumothorax, bronchiectasis, allergic bronchopulmonary aspergillosis, and heterozygous alpha 1 antitrypsin deficiency. Patient presented to the ED with SOB, worsening productive cough, and hyemoptysis x3 days PTA. He reported fever with temperature 100 at home. He denies chest dain, nausea, vomiting. Patient was admitted with dx of HCAP; s/p CXR.  Patient intubated and OGT in place, clamped. Re-estimated nutrition needs based on weight on 1/22 (39.7 kg) as this weight is more consistent with weight which had been stable for 2 months. No family/visitors at bedside. Spoke with RN briefly about plan to initiate TF today.   Per Dr. Pura Spice note this AM: acute on chronic hypoxemic  respiratory failure with increased WOB with bronchiectasis and COPD, RLL HCAP, severe sepsis with septic shock, L hydropneumothorax, multiple myeloma.    Patient is currently intubated on ventilator support MV: 12.2 L/min Temp (24hrs), Avg:99.2 F (37.3 C), Min:97.9 F (36.6 C), Max:100.9 F (38.3 C) BP: 145/85 and MAP: 104   Medications reviewed; 50 mg colace/day, 40 mg solu-medrol TID, 15 ml liquid multivitamin per OGT/day. Labs reviewed; K: 5.4 mmol/l, Cl: 118 mmol/l, BUN: 34 mg/dl, Ca: 7.2 mg/dl, Phos: 1.3 mg/dl.  IVF; 1/2 NS @ 100 ml/hr.  Drip; precedex @ 1 mcg/kg/hr.    Diet Order:   Diet Order    None      EDUCATION NEEDS:   No education needs have been identified at this time  Skin:  Skin Assessment: Reviewed RN Assessment  Last BM:  2/5  Height:   Ht Readings from Last 1 Encounters:  12/09/18 5' (1.524 m)    Weight:   Wt Readings from Last 1 Encounters:  12/09/18 43.9 kg    Ideal Body Weight:  48.18 kg  BMI:  Body mass index is 18.9 kg/m.  Estimated Nutritional Needs:   Kcal:  1598 kcal  Protein:  66-88 grams (1.5-2 grams/kg)  Fluid:  >/= 1.6 L/day     Jarome Matin, MS, RD, LDN, [facility] Inpatient Clinical Dietitian Pager # [phone] After hours/weekend pager # [phone]

## 2018-12-10 NOTE — [place] Notes (Signed)
Patient ID: [name] [name], male   DOB: September 11, 1961, 58 y.o.   MRN: [id number]         [facility] for Infectious Disease  Date of Admission:  12/06/2018    Total days of antibiotics 5        Day 3 cefazolin         ASSESSMENT: He had transient MSSA bacteremia and right lower lobe pneumonia upon admission.  Repeat blood cultures are negative and there is no evidence of endocarditis by TTE.  He had to be intubated the night before last and has some low-grade fever but overall seems to be better today.  I think his right upper lobe infiltrate is slightly improved.  I still think that his pneumonia is most likely due to MSSA.  He is not requiring any suctioning so no sputum has been obtained for stain or culture.  I will hold off on broadening his antimicrobial therapy.  PLAN: 1. Continue cefazolin for now 2. Await aspergillus studies  Principal Problem:   Bacteremia due to methicillin susceptible Staphylococcus aureus (MSSA) Active Problems:   Community acquired pneumonia   Pleural effusion, left   COPD GOLD IV criteria but never smoked    Multiple myeloma ([place])   Acute on chronic respiratory failure with hypoxia (HCC)   Grade II diastolic dysfunction   Protein-calorie malnutrition, severe   Sepsis (HCC)   Alpha-1-antitrypsin deficiency ([place])   Bronchiectasis ([place])   Acute respiratory distress   Scheduled Meds: . acyclovir  400 mg Oral Daily  . arformoterol  15 mcg Nebulization BID  . aspirin  81 mg Oral Daily  . budesonide (PULMICORT) nebulizer solution  0.5 mg Nebulization BID  . chlorhexidine gluconate (MEDLINE KIT)  15 mL Mouth Rinse BID  . Chlorhexidine Gluconate Cloth  6 each Topical Daily  . docusate  50 mg Per Tube QHS  . enoxaparin (LOVENOX) injection  30 mg Subcutaneous Q24H  . feeding supplement (ENSURE ENLIVE)  237 mL Oral BID BM  . mouth rinse  15 mL Mouth Rinse 10 times per day  . multivitamin  15 mL Per Tube Daily  . pantoprazole sodium  40 mg Per Tube Daily    Continuous Infusions: . sodium chloride 100 mL/hr at 12/10/18 0955  .  ceFAZolin (ANCEF) IV Stopped (12/10/18 0622)  . dexmedetomidine (PRECEDEX) IV infusion 1 mcg/kg/hr (12/10/18 0828)  . norepinephrine (LEVOPHED) Adult infusion Stopped (12/10/18 0001)   PRN Meds:.acetaminophen **OR** acetaminophen, bisacodyl, fentaNYL (SUBLIMAZE) injection, fentaNYL (SUBLIMAZE) injection, guaiFENesin, ipratropium-albuterol, labetalol, ondansetron **OR** ondansetron (ZOFRAN) IV, oxyCODONE-acetaminophen, sennosides, simethicone, sodium chloride flush   Review of Systems: Review of Systems  Unable to perform ROS: Intubated    No Known Allergies  OBJECTIVE: Vitals:   12/10/18 0600 12/10/18 0700 12/10/18 0754 12/10/18 0800  BP: (!) 147/86 (!) 151/91  (!) 163/89  Pulse: 75 70 68 67  Resp: (!) 34 (!) 32 (!) 28 (!) 27  Temp:    (!) 100.9 F (38.3 C)  TempSrc:    Axillary  SpO2: 96% 98% 97% 97%  Weight:      Height:       Body mass index is 18.9 kg/m.  Physical Exam Constitutional:      Comments: He remains sedated on the ventilator.  Cardiovascular:     Rate and Rhythm: Normal rate and regular rhythm.     Heart sounds: No murmur.  Pulmonary:     Breath sounds: Normal breath sounds.     Comments: His lungs are clear  anteriorly. No sputum is being suctioned.  He is weaning from the ventilator.     Lab Results Lab Results  Component Value Date   WBC 3.4 (L) 12/10/2018   HGB 9.7 (L) 12/10/2018   HCT 31.1 (L) 12/10/2018   MCV 79.1 (L) 12/10/2018   PLT 62 (L) 12/10/2018    Lab Results  Component Value Date   CREATININE 1.00 12/10/2018   BUN 34 (H) 12/10/2018   NA 144 12/10/2018   K 5.4 (H) 12/10/2018   CL 118 (H) 12/10/2018   CO2 20 (L) 12/10/2018    Lab Results  Component Value Date   ALT 74 (H) 12/09/2018   AST 157 (H) 12/09/2018   ALKPHOS 45 12/09/2018   BILITOT 0.5 12/09/2018     Microbiology: Recent Results (from the past 240 hour(s))  Blood culture (routine x 2)      Status: Abnormal   Collection Time: 12/06/18 11:53 AM  Result Value Ref Range Status   Specimen Description   Final    BLOOD FOREARM Performed at [facility], [place] [address], [place], [place] [address]    Special Requests   Final    BOTTLES DRAWN AEROBIC AND ANAEROBIC Blood Culture adequate volume Performed at [place] [address], [place], [place] [address]    Culture  Setup Time   Final    GRAM POSITIVE COCCI ANAEROBIC BOTTLE ONLY CRITICAL RESULT CALLED TO, READ BACK BY AND VERIFIED WITH: T GREEN PHARMD 1514 12/07/18 A BROWNING Performed at [facility], [place] [address] [place], [place] [address]    Culture STAPHYLOCOCCUS AUREUS (A)  Final   Report Status 12/09/2018 FINAL  Final   Organism ID, Bacteria STAPHYLOCOCCUS AUREUS  Final      Susceptibility   Staphylococcus aureus - MIC*    CIPROFLOXACIN <=0.5 SENSITIVE Sensitive     ERYTHROMYCIN >=8 RESISTANT Resistant     GENTAMICIN <=0.5 SENSITIVE Sensitive     OXACILLIN 0.5 SENSITIVE Sensitive     TETRACYCLINE <=1 SENSITIVE Sensitive     VANCOMYCIN <=0.5 SENSITIVE Sensitive     TRIMETH/SULFA <=10 SENSITIVE Sensitive     CLINDAMYCIN <=0.25 SENSITIVE Sensitive     RIFAMPIN <=0.5 SENSITIVE Sensitive     Inducible Clindamycin NEGATIVE Sensitive     * STAPHYLOCOCCUS AUREUS  Blood culture (routine x 2)     Status: None (Preliminary result)   Collection Time: 12/06/18 11:53 AM  Result Value Ref Range Status   Specimen Description   Final    BLOOD RIGHT FOREARM Performed at [facility], [place] [address] [place], [place] [address]    Special Requests   Final    BOTTLES DRAWN AEROBIC AND ANAEROBIC Blood Culture adequate volume Performed at [place] [address], [place], [place] [address]    Culture   Final    NO GROWTH 4 DAYS Performed at [facility], [place] [address] [place], [place] [address]    Report Status PENDING  Incomplete  Blood Culture ID  Panel (Reflexed)     Status: Abnormal   Collection Time: 12/06/18 11:53 AM  Result Value Ref Range Status   Enterococcus species NOT DETECTED NOT DETECTED Final   Listeria monocytogenes NOT DETECTED NOT DETECTED Final   Staphylococcus species DETECTED (A) NOT DETECTED Final    Comment: CRITICAL RESULT CALLED TO, READ BACK BY AND VERIFIED WITH: T GREEN PHARMD 1514 12/07/18 A BROWNING    Staphylococcus aureus (BCID) DETECTED (  A) NOT DETECTED Final    Comment: Methicillin (oxacillin) susceptible Staphylococcus aureus (MSSA). Preferred therapy is anti staphylococcal beta lactam antibiotic (Cefazolin or Nafcillin), unless clinically contraindicated. CRITICAL RESULT CALLED TO, READ BACK BY AND VERIFIED WITH: T GREEN PHARMD 1514 12/07/18 A BROWNING    Methicillin resistance NOT DETECTED NOT DETECTED Final   Streptococcus species NOT DETECTED NOT DETECTED Final   Streptococcus agalactiae NOT DETECTED NOT DETECTED Final   Streptococcus pneumoniae NOT DETECTED NOT DETECTED Final   Streptococcus pyogenes NOT DETECTED NOT DETECTED Final   Acinetobacter baumannii NOT DETECTED NOT DETECTED Final   Enterobacteriaceae species NOT DETECTED NOT DETECTED Final   Enterobacter cloacae complex NOT DETECTED NOT DETECTED Final   Escherichia coli NOT DETECTED NOT DETECTED Final   Klebsiella oxytoca NOT DETECTED NOT DETECTED Final   Klebsiella pneumoniae NOT DETECTED NOT DETECTED Final   Proteus species NOT DETECTED NOT DETECTED Final   Serratia marcescens NOT DETECTED NOT DETECTED Final   Haemophilus influenzae NOT DETECTED NOT DETECTED Final   Neisseria meningitidis NOT DETECTED NOT DETECTED Final   Pseudomonas aeruginosa NOT DETECTED NOT DETECTED Final   Candida albicans NOT DETECTED NOT DETECTED Final   Candida glabrata NOT DETECTED NOT DETECTED Final   Candida krusei NOT DETECTED NOT DETECTED Final   Candida parapsilosis NOT DETECTED NOT DETECTED Final   Candida tropicalis NOT DETECTED NOT DETECTED Final     Comment: Performed at [facility], [place]. [address] [place], [place] [address]  MRSA PCR Screening     Status: None   Collection Time: 12/06/18  5:41 PM  Result Value Ref Range Status   MRSA by PCR NEGATIVE NEGATIVE Final    Comment:        The GeneXpert MRSA Assay (FDA approved for NASAL specimens only), is one component of a comprehensive MRSA colonization surveillance program. It is not intended to diagnose MRSA infection nor to guide or monitor treatment for MRSA infections. Performed at [facility], [place] [address], [place], [place] [address]   Culture, blood (routine x 2)     Status: None (Preliminary result)   Collection Time: 12/07/18  6:34 PM  Result Value Ref Range Status   Specimen Description   Final    BLOOD RIGHT HAND Performed at [place] [address], [place], [place] [address]    Special Requests   Final    BOTTLES DRAWN AEROBIC ONLY Blood Culture results may not be optimal due to an inadequate volume of blood received in culture bottles Performed at [place] [address] [place], [place] [address]    Culture   Final    NO GROWTH 3 DAYS Performed at [facility], [place] [address], [place], [place] [address]    Report Status PENDING  Incomplete  Culture, blood (routine x 2)     Status: None (Preliminary result)   Collection Time: 12/07/18  6:35 PM  Result Value Ref Range Status   Specimen Description   Final    BLOOD RIGHT ARM Performed at [place] [address] [place], [place] [address]    Special Requests   Final    BOTTLES DRAWN AEROBIC ONLY Blood Culture results may not be optimal due to an inadequate volume of blood received in culture bottles Performed at [place] [address], [place], [place] [address]    Culture   Final    NO GROWTH 3 DAYS Performed at [facility]  [facility], [place] [address] [place], [place]  [address]    Report Status PENDING  Incomplete    Michel Bickers, MD [facility] [place] [facility] [phone] pager   [phone] cell 12/10/2018, 10:14 AM

## 2018-12-10 NOTE — [place] Notes (Signed)
NAMERamiz [name], MRN:  [id number], DOB:  1961-01-29, LOS: 3 ADMISSION DATE:  12/06/2018, CONSULTATION DATE: 12/06/2018 REFERRING MD: Dr. 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  Admitted with dyspnea, cough, hemoptysis and chest x-ray evidence for R HCAP  History of present illness   58 year old never smoker with multiple myeloma being treated with Revlimid, Dexamethasone, and Velcade.  Followed in our office by Dr. 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  Also secondary pulmonary hypertension in the setting of all the above.  Managed on Trelegy, oxygen.   He is here with progressive cough, blood-tinged sputum, low-grade fever that began about 3 days prior to admission.  His last myeloma treatment was last Thursday.  Chest x-ray shows possible progression of right lower lobe airspace disease superimposed on his chronic changes.  He has been tachypneic and tachycardic, has an elevated lactate = 4.    Past Medical History   Past Medical History:  Diagnosis Date  . Acute on chronic respiratory failure ([place]) 02/07/2018  . Hypertension   . Kidney stones   . Multiple myeloma ([place])   . Obstructive lung disease (generalized) ([place])   . Pulmonary hypertension ([place])     Significant Hospital Events     Consults:  ID CCM  Procedures:  ETT 2/5>>> R IJ TLC 2/5>>>  Significant Diagnostic Tests:  CT chest 2/2 >>   Micro Data:  Resp 2/2 >> NTD Blood 2/2 >> STAPHYLOCOCCUS AUREUS resistant to erythromycin  Antimicrobials:  Vanco 2/2 >> 2/4 Cefepime 2/2 >>2/4 Ancef 2/4>>> Acyclovir 2/4>>>     Interim history/subjective:  Off pressors overnight No further events  Objective   Blood pressure (!) 163/89, pulse 67, temperature (!) 100.9 F (38.3 C), temperature source Axillary, resp. rate (!) 27, height 5' (1.524 m), weight 43.9 kg, SpO2 97 %.    Vent Mode: PRVC FiO2 (%):  [40 %-70 %] 40 % Set Rate:  [22 bmp] 22 bmp Vt Set:  [400 mL] 400 mL PEEP:  [5 cmH20] 5 cmH20 Plateau Pressure:  [29 cmH20-34 cmH20] 30 cmH20   Intake/Output Summary (Last 24 hours) at 12/10/2018 0846 Last data filed at 12/10/2018 0700 Gross per 24 hour  Intake 1899.82 ml  Output 1050 ml  Net 849.82 ml   Filed Weights   12/06/18 1722 12/09/18 0400  Weight: 36.9 kg 43.9 kg   Examination: General: Acute on chronically ill appearing male, NAD on vent, comfortable HENT: [name]/AT, PERRL, EOM-I and MMM Lungs: Decreased BS diffusely Cardiovascular: IRIR, Nl S1/S2 and -M/R/G Abdomen: Soft, NT, ND and +BS Extremities: -edema and -tenderness Neuro: Alert and interactive, moving all ext to command  I reviewed CXR myself, ETT is in a good position  Resolved Hospital Problem list   Discussed with PCCM-NP  Assessment & Plan:   Acute on chronic hypoxemic respiratory failure with increased work of breathing - Begin PS trials but no extubation given pulmonary mechanics - ABG in AM - Adjust vent for ABG - May need to have a discussion regarding plan of care pending weaning efforts  Right lower lobe healthcare associated pneumonia.  Probably bacterial and due to his known bronchiectasis, immunosuppression due to  chemotherapy. Must consider the possibility of invasive aspergillosis given his colonization.  I think this will need to be evaluated with further imaging. - Cefazolin and acyclovir per ID - F/U on culture - ID consult appreciated  Severe sepsis - 0.45 NS at 100 ml/hr x24 hours - F/U on cultures - Antibiotics as above - Procal 2.88, ID following  Septic shock: - D/C Levophed - IVF as above -  Abx as above - Follow CVP  Hyperkalemia and hyperchloremia: - 1/2 NS - BMET in AM  Bronchiectasis and COPD (alpha-1 antitrypsin deficient) with acute exacerbation - Continue low dose solumedrol at 40 mg IV q8 - D/C prednisone - Bronchodilators ordered - F/U alpha 1 as outpatient - Pulmonary hygienes  Left hydropneumothorax, remote VATS decortication for empyema - No need for change in management at this point  Multiple myeloma - Agree with holding Revlimid for now - Acyclovir ordered  PCCM will assume primary   Labs   CBC: Recent Labs  Lab 12/06/18 1041 12/07/18 0343 12/08/18 0309 12/10/18 0309  WBC 16.1* 12.1* 10.8* 3.4*  HGB 12.6* 10.6* 9.7* 9.7*  HCT 39.0 34.5* 30.1* 31.1*  MCV 77.1* 81.4 75.6* 79.1*  PLT 142* 112* 88* 62*    Basic Metabolic Panel: Recent Labs  Lab 12/06/18 1041 12/07/18 0343 12/08/18 0309 12/09/18 1245 12/10/18 0309  NA 130* 139 139 141 144  K 3.4* 3.5 3.2* 5.4* 5.4*  CL 94* 114* 116* 116* 118*  CO2 23 18* 17* 21* 20*  GLUCOSE 162* 156* 133* 160* 155*  BUN '13 11 18 ' 33* 34*  CREATININE 1.06 0.86 0.76 0.90 1.00  CALCIUM 7.5* 5.9* 6.9* 7.0* 7.2*  MG  --   --  1.9  --  2.2  PHOS  --   --  1.8*  --  1.3*   GFR: Estimated Creatinine Clearance: 51.2 mL/min (by C-G formula based on SCr of 1 mg/dL). Recent Labs  Lab 12/06/18 1041 12/06/18 1153 12/06/18 1434 12/07/18 0343 12/08/18 0309 12/09/18 0446 12/10/18 0309  PROCALCITON  --   --   --  3.89 2.88 1.59  --   WBC 16.1*  --   --  12.1* 10.8*  --  3.4*  LATICACIDVEN  --  4.0* 3.2*  --   --   --   --     Liver Function Tests: Recent Labs  Lab 12/09/18 1245  AST 157*  ALT 74*  ALKPHOS 45  BILITOT 0.5  PROT 5.4*  ALBUMIN 2.3*   No results for input(s): LIPASE, AMYLASE in the last 168 hours. No results for input(s): AMMONIA in the last 168 hours.  ABG    Component Value Date/Time   PHART 7.447 12/10/2018 0335   PCO2ART 33.6 12/10/2018 0335   PO2ART 65.3 (L) 12/10/2018  0335   HCO3 22.8 12/10/2018 0335   TCO2 32 06/24/2016 0902   ACIDBASEDEF 0.4 12/10/2018 0335   O2SAT 91.6 12/10/2018 0335     Coagulation Profile: No results for input(s): INR, PROTIME in the last 168 hours.  Cardiac Enzymes: No results for input(s): CKTOTAL, CKMB, CKMBINDEX, TROPONINI in the last 168 hours.  HbA1C: No results found for: HGBA1C  CBG: No results for input(s): GLUCAP in the last 168 hours.  Review of Systems:   Some nausea, poor p.o. intake, emesis for 2 to 3 days Cough with mid chest discomfort that is somewhat musculoskeletal, productive of yellow-tinged with blood Low-grade fever No diarrhea  Past Medical History  He,  has a past medical  history of Acute on chronic respiratory failure ([place]) (02/07/2018), Hypertension, Kidney stones, Multiple myeloma ([place]), Obstructive lung disease (generalized) ([place]), and Pulmonary hypertension ([place]).   Surgical History    Past Surgical History:  Procedure Laterality Date  . CARDIAC CATHETERIZATION N/A 01/18/2016   Procedure: Right Heart Cath;  Surgeon: Jolaine Artist, MD;  Location: [facility];  Service: Cardiovascular;  Laterality: N/A;  . CARDIAC CATHETERIZATION N/A 06/24/2016   Procedure: Right Heart Cath;  Surgeon: Jolaine Artist, MD;  Location: [facility];  Service: Cardiovascular;  Laterality: N/A;  . IR RADIOLOGIST EVAL & MGMT  05/27/2018  . IR RADIOLOGIST EVAL & MGMT  09/01/2018  . KIDNEY SURGERY    . VIDEO ASSISTED THORACOSCOPY (VATS)/DECORTICATION Left 2003  . VIDEO ASSISTED THORACOSCOPY (VATS)/DECORTICATION Left 10/02/2015   Procedure: VIDEO ASSISTED THORACOSCOPY (VATS)/DECORTICATION and drainage of chronic empyena;  Surgeon: [name], MD;  Location: [facility];  Service: Thoracic;  Laterality: Left;  Marland Kitchen VIDEO BRONCHOSCOPY N/A 10/02/2015   Procedure: VIDEO BRONCHOSCOPY;  Surgeon: [name], MD;  Location: [facility];  Service: Thoracic;  Laterality: N/A;     Social History   reports  that he has never smoked. He has never used smokeless tobacco. He reports current alcohol use. He reports that he does not use drugs.   Family History   His family history includes Hypertension in an other family member.   Allergies No Known Allergies   Home Medications  Prior to Admission medications   Medication Sig Start Date End Date Taking? Authorizing Provider  acyclovir (ZOVIRAX) 400 MG tablet Take 1 tablet (400 mg total) by mouth daily. 11/18/18  Yes Brunetta Genera, MD  aspirin EC 81 MG tablet Take 1 tablet (81 mg total) by mouth daily. 11/18/18  Yes Brunetta Genera, MD  dexamethasone (DECADRON) 4 MG tablet Take 5 tablets (20 mg total) by mouth once a week. 11/18/18  Yes Brunetta Genera, MD  docusate sodium (COLACE) 50 MG capsule Take 1 capsule (50 mg total) by mouth daily. 03/06/18  Yes [name], PA-C  Fluticasone-Umeclidin-Vilant (TRELEGY ELLIPTA) 100-62.5-25 MCG/INH AEPB Inhale 1 puff into the lungs daily. 07/31/18  Yes Tanda Rockers, MD  lenalidomide (REVLIMID) 25 MG capsule Take 1 capsule (25 mg total) by mouth daily. Take for 21 days and take 7 days off. Authorization#[id number] 10/19/18 11/18/18  Yes Brunetta Genera, MD  omeprazole (PRILOSEC) 40 MG capsule Take 1 capsule (40 mg total) by mouth daily. 08/20/18  Yes Brunetta Genera, MD  oxyCODONE-acetaminophen (PERCOCET/ROXICET) 5-325 MG tablet Take 1 tablet by mouth every 6 (six) hours as needed for severe pain. 08/27/18  Yes Brunetta Genera, MD  OXYGEN 3L BEDTIME ONLY   Yes [provider]  PROVENTIL HFA 108 (90 Base) MCG/ACT inhaler INHALE 2 PUFFS EVERY 6 HOURS AS NEEDED FOR WHEEZING OR SHORTNESS OF BREATH. 10/20/18  Yes Juanito Doom, MD  guaiFENesin (MUCINEX) 600 MG 12 hr tablet Take 2 tablets (1,200 mg total) by mouth 2 (two) times daily. Patient not taking: Reported on 12/06/2018 09/05/18   Oswald Hillock, MD  nortriptyline (PAMELOR) 25 MG capsule Take 1 capsule (25 mg total) by  mouth at bedtime. Patient not taking: Reported on 12/06/2018 08/20/18   Brunetta Genera, MD  ondansetron (ZOFRAN) 8 MG tablet Take 1 tablet (8 mg total) by mouth every 8 (eight) hours as needed for nausea. Patient not taking: Reported on 12/06/2018 09/23/18   Brunetta Genera,  MD    The patient is critically ill with multiple organ systems failure and requires high complexity decision making for assessment and support, frequent evaluation and titration of therapies, application of advanced monitoring technologies and extensive interpretation of multiple databases.   Critical Care Time devoted to patient care services described in this note is  34  Minutes. This time reflects time of care of this signee Dr Jennet Maduro. This critical care time does not reflect procedure time, or teaching time or supervisory time of PA/NP/Med student/Med Resident etc but could involve care discussion time.  Rush Farmer, M.D. [facility] Pulmonary/Critical Care Medicine. Pager: [phone]. After hours pager: [phone].

## 2018-12-10 NOTE — [place] Notes (Signed)
RT notified by RN that patient was placed on PSV by Dr. Nelda Marseille. RT unsure when vent mode changed. Patient not in distress.

## 2018-12-10 NOTE — [place] Notes (Signed)
CPT held per increased BP and RR.

## 2018-12-10 NOTE — [place] Notes (Signed)
OT Cancellation Note  Patient Details Name: [name] [name] MRN: [id number] DOB: Apr 14, 1961   Cancelled Treatment:    Reason Eval/Treat Not Completed: Other (comment).  Pt remains on vent. Please reorder OT when pt extubated. Thank you  Reichen Hutzler 12/10/2018, 7:18 AM  Lesle Chris, OTR/L Acute Rehabilitation Services [phone] [facility] pager [phone] office 12/10/2018

## 2018-12-10 NOTE — [place] Notes (Signed)
eLink Physician-Brief [place] Note Patient Name: Linzy Darling DOB: 1961-01-07 MRN: [id number]   Date of Service  12/10/2018  HPI/Events of Note  Hyperglycemia  eICU Interventions  Phase I insulin coverage standard protocol ordered        Frederik Pear 12/10/2018, 8:59 PM

## 2018-12-11 ENCOUNTER — Other Ambulatory Visit: Payer: Self-pay

## 2018-12-11 ENCOUNTER — Inpatient Hospital Stay (HOSPITAL_COMMUNITY): Payer: Medicare Other

## 2018-12-11 LAB — CBC
HEMATOCRIT: 26.8 % — AB (ref 39.0–52.0)
HEMOGLOBIN: 8.6 g/dL — AB (ref 13.0–17.0)
MCH: 24.7 pg — ABNORMAL LOW (ref 26.0–34.0)
MCHC: 32.1 g/dL (ref 30.0–36.0)
MCV: 77 fL — ABNORMAL LOW (ref 80.0–100.0)
Platelets: 90 10*3/uL — ABNORMAL LOW (ref 150–400)
RBC: 3.48 MIL/uL — ABNORMAL LOW (ref 4.22–5.81)
RDW: 16.6 % — ABNORMAL HIGH (ref 11.5–15.5)
WBC: 2.8 10*3/uL — ABNORMAL LOW (ref 4.0–10.5)
nRBC: 0.7 % — ABNORMAL HIGH (ref 0.0–0.2)

## 2018-12-11 LAB — CULTURE, BLOOD (ROUTINE X 2)
Culture: NO GROWTH
Special Requests: ADEQUATE

## 2018-12-11 LAB — BLOOD GAS, ARTERIAL
Acid-Base Excess: 1.9 mmol/L (ref 0.0–2.0)
Bicarbonate: 26.7 mmol/L (ref 20.0–28.0)
Drawn by: 225631
FIO2: 40
MECHVT: 0.4 mL
O2 SAT: 94.9 %
PEEP: 5 cmH2O
Patient temperature: 98.6
RATE: 22 resp/min
pCO2 arterial: 45.9 mmHg (ref 32.0–48.0)
pH, Arterial: 7.382 (ref 7.350–7.450)
pO2, Arterial: 83.3 mmHg (ref 83.0–108.0)

## 2018-12-11 LAB — BASIC METABOLIC PANEL
Anion gap: 3 — ABNORMAL LOW (ref 5–15)
BUN: 35 mg/dL — ABNORMAL HIGH (ref 6–20)
CHLORIDE: 114 mmol/L — AB (ref 98–111)
CO2: 26 mmol/L (ref 22–32)
Calcium: 6.6 mg/dL — ABNORMAL LOW (ref 8.9–10.3)
Creatinine, Ser: 0.87 mg/dL (ref 0.61–1.24)
GFR calc Af Amer: >60 mL/min (ref >60)
GFR calc non Af Amer: >60 mL/min (ref >60)
Glucose, Bld: 164 mg/dL — ABNORMAL HIGH (ref 70–99)
Potassium: 4.4 mmol/L (ref 3.5–5.1)
Sodium: 143 mmol/L (ref 135–145)

## 2018-12-11 LAB — GLUCOSE, CAPILLARY
GLUCOSE-CAPILLARY: 183 mg/dL — AB (ref 70–99)
GLUCOSE-CAPILLARY: 169 mg/dL — AB (ref 70–99)
Glucose-Capillary: 130 mg/dL — ABNORMAL HIGH (ref 70–99)
Glucose-Capillary: 165 mg/dL — ABNORMAL HIGH (ref 70–99)
Glucose-Capillary: 199 mg/dL — ABNORMAL HIGH (ref 70–99)

## 2018-12-11 LAB — MAGNESIUM: Magnesium: 2.1 mg/dL (ref 1.7–2.4)

## 2018-12-11 LAB — PHOSPHORUS: PHOSPHORUS: 1.7 mg/dL — AB (ref 2.5–4.6)

## 2018-12-11 LAB — MISC LABCORP TEST (SEND OUT)

## 2018-12-11 MED ORDER — FENTANYL 2500MCG IN NS 250ML (10MCG/ML) PREMIX INFUSION
0.0000 ug/h | INTRAVENOUS | Status: DC
  Administered 2018-12-11 (×2): 25 ug/h via INTRAVENOUS
  Filled 2018-12-11 (×2): qty 250

## 2018-12-11 MED ORDER — SODIUM PHOSPHATES 45 MMOLE/15ML IV SOLN
30.0000 mmol | Freq: Once | INTRAVENOUS | Status: AC
  Administered 2018-12-11: 30 mmol via INTRAVENOUS
  Filled 2018-12-11: qty 10

## 2018-12-11 MED ORDER — VITAL AF 1.2 CAL PO LIQD
1000.0000 mL | ORAL | Status: DC
  Administered 2018-12-11 – 2018-12-12 (×2): 1000 mL
  Filled 2018-12-11 (×2): qty 1000

## 2018-12-11 NOTE — Progress Notes (Signed)
Nutrition Follow-up  DOCUMENTATION CODES:   Severe malnutrition in context of chronic illness, Underweight  INTERVENTION:  - Advance Vital AF 1.2 by 5 ml in 12 hours to reach new goal rate of 50 ml/hr. - Goal of TF regimen will provide 1440 kcal, 90 grams of protein, and 973 ml free water. - Free water flush to continue to be per MD/NP.  Continue to monitor magnesium, potassium, and phosphorus daily for at least 3 days, MD to replete as needed, as pt is at risk for refeeding syndrome given severe malnutrition, current hypophosphatemia, poor PO intakes for a prolonged period PTA.    NUTRITION DIAGNOSIS:   Severe Malnutrition related to chronic illness, cancer and cancer related treatments as evidenced by moderate fat depletion, severe fat depletion, moderate muscle depletion, severe muscle depletion. -ongoing  GOAL:   Patient will meet greater than or equal to 90% of their needs -met with TF regimen  MONITOR:   Vent status, TF tolerance, Weight trends, Labs  ASSESSMENT:   58 year-old male with past medical history significant for multiple myeloma on Revlimid,  pulmonary HTN, chronic hypoxic respiratory failure, chronic L-sided hydropneumothorax, bronchiectasis, allergic bronchopulmonary aspergillosis, and heterozygous alpha 1 antitrypsin deficiency. Patient presented to the ED with SOB, worsening productive cough, and hyemoptysis x3 days PTA. He reported fever with temperature 100 at home. He denies chest dain, nausea, vomiting. Patient was admitted with dx of HCAP; s/p CXR.  Weight continues to trend up; likely fluid related. Patient remains intubated with OGT in place and he is currently receiving Vital AF 1.2 @ 45 ml/hr with 30 ml free water every 4 hours. This regimen is providing 1296 kcal, 81 grams of protein, and 1056 ml free water. No family/visitors at bedside. RN reports patient tolerating TF without issue.    Patient is currently intubated on ventilator support MV: 9.4  L/min Temp (24hrs), Avg:98.6 F (37 C), Min:97.3 F (36.3 C), Max:99.7 F (37.6 C) Propofol: none BP: 133/86 and MAP: 99   Medications reviewed; 50 mg colace/day, sliding scale novolog, 40 mg solu-medrol TID, 15 ml liquid multivitamin per OGT/day. Labs reviewed; Cl: 114 mmol/l, BUN: 35 mg/dl, Ca: 6.6 mg/dl, Phos: 1.7 mg/dl.         Diet Order:   Diet Order    None      EDUCATION NEEDS:   No education needs have been identified at this time  Skin:  Skin Assessment: Reviewed RN Assessment  Last BM:  2/5  Height:   Ht Readings from Last 1 Encounters:  12/09/18 5' (1.524 m)    Weight:   Wt Readings from Last 1 Encounters:  12/11/18 46.2 kg    Ideal Body Weight:  48.18 kg  BMI:  Body mass index is 19.89 kg/m.  Estimated Nutritional Needs:   Kcal:  1434 kcal  Protein:  66-88 grams (1.5-2 grams/kg)  Fluid:  >/= 1.6 L/day      Jarome Matin, MS, RD, LDN, [facility] Inpatient Clinical Dietitian Pager # [phone] After hours/weekend pager # [phone]

## 2018-12-11 NOTE — Progress Notes (Signed)
eLink Physician-Brief Progress Note Patient Name: [name] [name] DOB: Oct 07, 1961 MRN: [id number]   Date of Service  12/11/2018  HPI/Events of Note  Appears uncomfortable on the ventilator per his bedside RN   eICU Interventions  Fentanyl infusion started to optimize patient comfort.        Kerry Kass Reshad Saab 12/11/2018, 2:17 AM

## 2018-12-11 NOTE — Progress Notes (Signed)
CPT BID via Chest Vest not done @ 2000, will try to have clarified 12/11/2018 for CPT via Bed if pt. more able to tolerate at this time, becomes agitated with routine suctioning, Rt to follow.

## 2018-12-11 NOTE — Progress Notes (Signed)
NAMEMaxxon [name], MRN:  [id number], DOB:  December 18, 1960, LOS: 4 ADMISSION DATE:  12/06/2018, CONSULTATION DATE: 12/06/2018 REFERRING MD: Dr. 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  Admitted with dyspnea, cough, hemoptysis and chest x-ray evidence for R HCAP  History of present illness   58 year old never smoker with multiple myeloma being treated with Revlimid, Dexamethasone, and Velcade.  Followed in our office by Dr. 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  Also secondary pulmonary hypertension in the setting of all the above.  Managed on Trelegy, oxygen.   He is here with progressive cough, blood-tinged sputum, low-grade fever that began about 3 days prior to admission.  His last myeloma treatment was last Thursday.  Chest x-ray shows possible progression of right lower lobe airspace disease superimposed on his chronic changes.  He has been tachypneic and tachycardic, has an elevated lactate = 4.   Past Medical History   Past Medical History:  Diagnosis Date  . Acute on chronic respiratory failure ([place]) 02/07/2018  . Hypertension   . Kidney stones   . Multiple myeloma ([place])   . Obstructive lung disease (generalized) ([place])   . Pulmonary hypertension ([place])     Significant Hospital Events     Consults:  ID CCM  Procedures:  ETT 2/5>>> R IJ TLC 2/5>>>  Significant Diagnostic Tests:  CT chest 2/2 >>   Micro Data:  Resp 2/2 >> NTD Blood 2/2 >> STAPHYLOCOCCUS AUREUS resistant to erythromycin  Antimicrobials:  Vanco 2/2 >> 2/4 Cefepime 2/2 >>2/4 Ancef 2/4>>> Acyclovir 2/4>>>     Interim history/subjective:  Off pressors, failed SBT this AM  Objective   Blood pressure 101/66, pulse 69, temperature 98.7 F (37.1 C), temperature source Oral, resp. rate (!) 32, height 5' (1.524 m), weight 46.2 kg, SpO2 96 %.    Vent Mode: PRVC FiO2 (%):  [40 %] 40 % Set Rate:  [22 bmp] 22 bmp Vt Set:  [400 mL] 400 mL PEEP:  [5 cmH20] 5 cmH20 Pressure Support:  [10 cmH20] 10 cmH20 Plateau Pressure:  [21 cmH20-22 cmH20] 22 cmH20   Intake/Output Summary (Last 24 hours) at 12/11/2018 0836 Last data filed at 12/11/2018 2876 Gross per 24 hour  Intake 3050.52 ml  Output 1050 ml  Net 2000.52 ml   Filed Weights   12/06/18 1722 12/09/18 0400 12/11/18 0317  Weight: 36.9 kg 43.9 kg 46.2 kg   Examination: General: Acute on chronically ill appearing male, NAD on vent HENT: [place]/AT, PERRL, EOM-I and MMM Lungs: Decreased BS diffusely Cardiovascular: RRR, Nl S1/S2 and -M/R/G Abdomen: Soft, NT, ND and +BS Extremities: -edema and -tenderness Neuro: Alert and interactive, moving all ext to command  I reviewed CXR myself, ETT is in a good position  Resolved Hospital Problem list   Discussed with PCCM-NP  Assessment & Plan:   Acute on chronic hypoxemic respiratory failure with increased work of breathing - PS trials as able - ABG and CXR in AM - Adjust vent for ABG - May need to have a discussion regarding plan of care pending weaning efforts  Right lower lobe healthcare associated pneumonia.  Probably bacterial and due to his known bronchiectasis,  immunosuppression due to chemotherapy. Must consider the possibility of invasive aspergillosis given his colonization.  I think this will need to be evaluated with further imaging. - Cefazolin and acyclovir per ID - F/U on culture - ID consult appreciated  Severe sepsis - KVO IVF - F/U on cultures - Antibiotics as above - Procal 2.88, ID following  Septic shock: - D/C Levophed - KVO IVF - Abx as above  Hyperkalemia and  hyperchloremia: - KVO IVF - BMET in AM  Bronchiectasis and COPD (alpha-1 antitrypsin deficient) with acute exacerbation - Continue solumedrol - Bronchodilators ordered - F/U alpha 1 as outpatient - Pulmonary hygienes  Left hydropneumothorax, remote VATS decortication for empyema - No need for change in management at this point  Multiple myeloma - Agree with holding Revlimid for now - Acyclovir ordered  Labs   CBC: Recent Labs  Lab 12/06/18 1041 12/07/18 0343 12/08/18 0309 12/10/18 0309 12/11/18 0330  WBC 16.1* 12.1* 10.8* 3.4* 2.8*  HGB 12.6* 10.6* 9.7* 9.7* 8.6*  HCT 39.0 34.5* 30.1* 31.1* 26.8*  MCV 77.1* 81.4 75.6* 79.1* 77.0*  PLT 142* 112* 88* 62* 90*    Basic Metabolic Panel: Recent Labs  Lab 12/07/18 0343 12/08/18 0309 12/09/18 1245 12/10/18 0309 12/11/18 0330  NA 139 139 141 144 143  K 3.5 3.2* 5.4* 5.4* 4.4  CL 114* 116* 116* 118* 114*  CO2 18* 17* 21* 20* 26  GLUCOSE 156* 133* 160* 155* 164*  BUN 11 18 33* 34* 35*  CREATININE 0.86 0.76 0.90 1.00 0.87  CALCIUM 5.9* 6.9* 7.0* 7.2* 6.6*  MG  --  1.9  --  2.2 2.1  PHOS  --  1.8*  --  1.3* 1.7*   GFR: Estimated Creatinine Clearance: 62 mL/min (by C-G formula based on SCr of 0.87 mg/dL). Recent Labs  Lab 12/06/18 1153 12/06/18 1434 12/07/18 0343 12/08/18 0309 12/09/18 0446 12/10/18 0309 12/11/18 0330  PROCALCITON  --   --  3.89 2.88 1.59  --   --   WBC  --   --  12.1* 10.8*  --  3.4* 2.8*  LATICACIDVEN 4.0* 3.2*  --   --   --   --   --     Liver Function Tests: Recent Labs  Lab 12/09/18 1245  AST 157*  ALT 74*  ALKPHOS 45  BILITOT 0.5  PROT 5.4*  ALBUMIN 2.3*   No results for input(s): LIPASE, AMYLASE in the last 168 hours. No results for input(s): AMMONIA in the last 168 hours.  ABG    Component Value Date/Time   PHART 7.382 12/11/2018 0510   PCO2ART 45.9 12/11/2018 0510   PO2ART 83.3 12/11/2018 0510   HCO3 26.7 12/11/2018 0510   TCO2 32 06/24/2016 0902   ACIDBASEDEF 0.4  12/10/2018 0335   O2SAT 94.9 12/11/2018 0510     Coagulation Profile: No results for input(s): INR, PROTIME in the last 168 hours.  Cardiac Enzymes: No results for input(s): CKTOTAL, CKMB, CKMBINDEX, TROPONINI in the last 168 hours.  HbA1C: No results found for: HGBA1C  CBG: Recent Labs  Lab 12/10/18 1540 12/10/18 1953 12/10/18 2349 12/11/18 0320 12/11/18 0731  GLUCAP 166* 177* 192* 130* 165*    Review of Systems:   Some nausea, poor p.o. intake, emesis for 2 to 3 days Cough with mid chest discomfort that is somewhat musculoskeletal, productive of yellow-tinged with blood Low-grade fever No diarrhea  Past Medical History  He,  has a past medical history of Acute on chronic respiratory failure (  [place]) (02/07/2018), Hypertension, Kidney stones, Multiple myeloma ([place]), Obstructive lung disease (generalized) ([place]), and Pulmonary hypertension ([place]).   Surgical History    Past Surgical History:  Procedure Laterality Date  . CARDIAC CATHETERIZATION N/A 01/18/2016   Procedure: Right Heart Cath;  Surgeon: Jolaine Artist, MD;  Location: [place] [facility];  Service: Cardiovascular;  Laterality: N/A;  . CARDIAC CATHETERIZATION N/A 06/24/2016   Procedure: Right Heart Cath;  Surgeon: Jolaine Artist, MD;  Location: [place] [facility];  Service: Cardiovascular;  Laterality: N/A;  . IR RADIOLOGIST EVAL & MGMT  05/27/2018  . IR RADIOLOGIST EVAL & MGMT  09/01/2018  . KIDNEY SURGERY    . VIDEO ASSISTED THORACOSCOPY (VATS)/DECORTICATION Left 2003  . VIDEO ASSISTED THORACOSCOPY (VATS)/DECORTICATION Left 10/02/2015   Procedure: VIDEO ASSISTED THORACOSCOPY (VATS)/DECORTICATION and drainage of chronic empyena;  Surgeon: [name], MD;  Location: [facility];  Service: Thoracic;  Laterality: Left;  Marland Kitchen VIDEO BRONCHOSCOPY N/A 10/02/2015   Procedure: VIDEO BRONCHOSCOPY;  Surgeon: [name], MD;  Location: [facility];  Service: Thoracic;  Laterality: N/A;     Social History   reports that  he has never smoked. He has never used smokeless tobacco. He reports current alcohol use. He reports that he does not use drugs.   Family History   His family history includes Hypertension in an other family member.   Allergies No Known Allergies   Home Medications  Prior to Admission medications   Medication Sig Start Date End Date Taking? Authorizing Provider  acyclovir (ZOVIRAX) 400 MG tablet Take 1 tablet (400 mg total) by mouth daily. 11/18/18  Yes Brunetta Genera, MD  aspirin EC 81 MG tablet Take 1 tablet (81 mg total) by mouth daily. 11/18/18  Yes Brunetta Genera, MD  dexamethasone (DECADRON) 4 MG tablet Take 5 tablets (20 mg total) by mouth once a week. 11/18/18  Yes Brunetta Genera, MD  docusate sodium (COLACE) 50 MG capsule Take 1 capsule (50 mg total) by mouth daily. 03/06/18  Yes [name], PA-C  Fluticasone-Umeclidin-Vilant (TRELEGY ELLIPTA) 100-62.5-25 MCG/INH AEPB Inhale 1 puff into the lungs daily. 07/31/18  Yes Tanda Rockers, MD  lenalidomide (REVLIMID) 25 MG capsule Take 1 capsule (25 mg total) by mouth daily. Take for 21 days and take 7 days off. Authorization#[id number] 10/19/18 11/18/18  Yes Brunetta Genera, MD  omeprazole (PRILOSEC) 40 MG capsule Take 1 capsule (40 mg total) by mouth daily. 08/20/18  Yes Brunetta Genera, MD  oxyCODONE-acetaminophen (PERCOCET/ROXICET) 5-325 MG tablet Take 1 tablet by mouth every 6 (six) hours as needed for severe pain. 08/27/18  Yes Brunetta Genera, MD  OXYGEN 3L BEDTIME ONLY   Yes [provider]  PROVENTIL HFA 108 (90 Base) MCG/ACT inhaler INHALE 2 PUFFS EVERY 6 HOURS AS NEEDED FOR WHEEZING OR SHORTNESS OF BREATH. 10/20/18  Yes Juanito Doom, MD  guaiFENesin (MUCINEX) 600 MG 12 hr tablet Take 2 tablets (1,200 mg total) by mouth 2 (two) times daily. Patient not taking: Reported on 12/06/2018 09/05/18   Oswald Hillock, MD  nortriptyline (PAMELOR) 25 MG capsule Take 1 capsule (25 mg total) by mouth  at bedtime. Patient not taking: Reported on 12/06/2018 08/20/18   Brunetta Genera, MD  ondansetron (ZOFRAN) 8 MG tablet Take 1 tablet (8 mg total) by mouth every 8 (eight) hours as needed for nausea. Patient not taking: Reported on 12/06/2018 09/23/18   Brunetta Genera, MD    The patient is  critically ill with multiple organ systems failure and requires high complexity decision making for assessment and support, frequent evaluation and titration of therapies, application of advanced monitoring technologies and extensive interpretation of multiple databases.   Critical Care Time devoted to patient care services described in this note is  33  Minutes. This time reflects time of care of this signee Dr Jennet Maduro. This critical care time does not reflect procedure time, or teaching time or supervisory time of PA/NP/Med student/Med Resident etc but could involve care discussion time.  Rush Farmer, M.D. [facility] Pulmonary/Critical Care Medicine. Pager: [phone]. After hours pager: [phone].

## 2018-12-11 NOTE — Care Management Note (Signed)
Case Management Note  Patient Details  Name: [name] [name] MRN: [id number] Date of Birth: 06-04-1961  Subjective/Objective:                  Discharge Readiness Return to top of Pneumonia RRG - ISC  Discharge readiness is indicated by patient meeting Recovery Milestones, including ALL of the following: ? Hemodynamic stability yes ? Tachypnea absent no  ? Hypoxemia absent on full ventilator support ? Afebrile, or temperature acceptable for next level of care yes ? Oxygen absent or at baseline need /vent ? Mental status at baseline sedated ? Antibiotic regimen acceptable for next level of care ? Iv ancef, ? Ambulatory no ? Oral hydration, medications, and diet ? Npo, iv precedex,iv solu-medrol, iv levophed ? Level of care-icu   Action/Plan: Will follow for progression of care and clinical status. Will follow for case management needs none present at this time.  Expected Discharge Date:  12/08/18               Expected Discharge Plan:     In-House Referral:     Discharge planning Services     Post Acute Care Choice:    Choice offered to:     DME Arranged:    DME Agency:     HH Arranged:    HH Agency:     Status of Service:     If discussed at [employer], dates discussed:    Additional Comments:  Leeroy Cha, RN 12/11/2018, 10:01 AM

## 2018-12-11 NOTE — Progress Notes (Signed)
Patient ID: [name] [name], male   DOB: Mar 25, 1961, 58 y.o.   MRN: [id number]         [facility]  Date of Admission:  12/06/2018    Total days of antibiotics 6        Day 4 cefazolin         ASSESSMENT: He is improving clinically and radiographically on therapy for severe pneumonia complicated by MSSA pneumonia.  Repeat blood cultures are negative.  TTE was negative for vegetations but I would consider a TEE next week given the severity of his illness and his immunosuppression.  PLAN: 1. Continue cefazolin  2. Recommend TEE next week 3. Please call me for any ID questions this weekend  Principal Problem:   Bacteremia due to methicillin susceptible Staphylococcus aureus (MSSA) Active Problems:   Community acquired pneumonia   Pleural effusion, left   COPD GOLD IV criteria but never smoked    Multiple myeloma ([name] [name])   Acute on chronic respiratory failure with hypoxia (HCC)   Grade II diastolic dysfunction   Protein-calorie malnutrition, severe   Sepsis ([name] [name])   Alpha-1-antitrypsin deficiency ([name] [name])   Bronchiectasis ([name] [name])   Acute respiratory distress   Scheduled Meds: . acyclovir  400 mg Oral Daily  . arformoterol  15 mcg Nebulization BID  . aspirin  81 mg Per Tube Daily  . budesonide (PULMICORT) nebulizer solution  0.5 mg Nebulization BID  . chlorhexidine gluconate (MEDLINE KIT)  15 mL Mouth Rinse BID  . Chlorhexidine Gluconate Cloth  6 each Topical Daily  . docusate  50 mg Per Tube QHS  . enoxaparin (LOVENOX) injection  30 mg Subcutaneous Q24H  . insulin aspart  2-6 Units Subcutaneous Q4H  . mouth rinse  15 mL Mouth Rinse 10 times per day  . methylPREDNISolone (SOLU-MEDROL) injection  40 mg Intravenous Q8H  . multivitamin  15 mL Per Tube Daily  . pantoprazole sodium  40 mg Per Tube Daily   Continuous Infusions: .  ceFAZolin (ANCEF) IV Stopped (12/11/18 0656)  . dexmedetomidine (PRECEDEX) IV infusion Stopped (12/11/18 0843)  . feeding supplement  (VITAL AF 1.2 CAL)    . fentaNYL infusion INTRAVENOUS Stopped (12/11/18 0818)  . norepinephrine (LEVOPHED) Adult infusion Stopped (12/10/18 0001)   PRN Meds:.acetaminophen (TYLENOL) oral liquid 160 mg/5 mL **OR** acetaminophen, bisacodyl, guaiFENesin, ipratropium-albuterol, labetalol, ondansetron **OR** ondansetron (ZOFRAN) IV, oxyCODONE-acetaminophen, sennosides, simethicone, sodium chloride flush   Review of Systems: Review of Systems  Unable to perform ROS: Intubated    No Known Allergies  OBJECTIVE: Vitals:   12/11/18 1000 12/11/18 1100 12/11/18 1200 12/11/18 1203  BP: 125/78 127/83 128/81   Pulse: 75 77 73 77  Resp: (!) 22 (!) 0 (!) 23 (!) 26  Temp:   98 F (36.7 C)   TempSrc:   Oral   SpO2: 97% 98% 99% 99%  Weight:      Height:       Body mass index is 19.89 kg/m.  Physical Exam Constitutional:      Comments: He is alert and resting quietly on the ventilator.  Cardiovascular:     Rate and Rhythm: Normal rate and regular rhythm.     Heart sounds: No murmur.  Pulmonary:     Breath sounds: Normal breath sounds.     Comments: His lungs are clear anteriorly.    Lab Results Lab Results  Component Value Date   WBC 2.8 (L) 12/11/2018   HGB 8.6 (L) 12/11/2018   HCT 26.8 (L) 12/11/2018  MCV 77.0 (L) 12/11/2018   PLT 90 (L) 12/11/2018    Lab Results  Component Value Date   CREATININE 0.87 12/11/2018   BUN 35 (H) 12/11/2018   NA 143 12/11/2018   K 4.4 12/11/2018   CL 114 (H) 12/11/2018   CO2 26 12/11/2018    Lab Results  Component Value Date   ALT 74 (H) 12/09/2018   AST 157 (H) 12/09/2018   ALKPHOS 45 12/09/2018   BILITOT 0.5 12/09/2018     Microbiology: Recent Results (from the past 240 hour(s))  Blood culture (routine x 2)     Status: Abnormal   Collection Time: 12/06/18 11:53 AM  Result Value Ref Range Status   Specimen Description   Final    BLOOD FOREARM Performed at [facility], [place] [address], [place], [place] [address]    Special  Requests   Final    BOTTLES DRAWN AEROBIC AND ANAEROBIC Blood Culture adequate volume Performed at [place] [address] [place], [place] [address]    Culture  Setup Time   Final    GRAM POSITIVE COCCI ANAEROBIC BOTTLE ONLY CRITICAL RESULT CALLED TO, READ BACK BY AND VERIFIED WITH: T GREEN PHARMD 1514 12/07/18 A BROWNING Performed at [facility], [place] [address] [place], [place] [address]    Culture STAPHYLOCOCCUS AUREUS (A)  Final   Report Status 12/09/2018 FINAL  Final   Organism ID, Bacteria STAPHYLOCOCCUS AUREUS  Final      Susceptibility   Staphylococcus aureus - MIC*    CIPROFLOXACIN <=0.5 SENSITIVE Sensitive     ERYTHROMYCIN >=8 RESISTANT Resistant     GENTAMICIN <=0.5 SENSITIVE Sensitive     OXACILLIN 0.5 SENSITIVE Sensitive     TETRACYCLINE <=1 SENSITIVE Sensitive     VANCOMYCIN <=0.5 SENSITIVE Sensitive     TRIMETH/SULFA <=10 SENSITIVE Sensitive     CLINDAMYCIN <=0.25 SENSITIVE Sensitive     RIFAMPIN <=0.5 SENSITIVE Sensitive     Inducible Clindamycin NEGATIVE Sensitive     * STAPHYLOCOCCUS AUREUS  Blood culture (routine x 2)     Status: None   Collection Time: 12/06/18 11:53 AM  Result Value Ref Range Status   Specimen Description   Final    BLOOD RIGHT FOREARM Performed at [facility], [place] [address], [place], [place] [address]    Special Requests   Final    BOTTLES DRAWN AEROBIC AND ANAEROBIC Blood Culture adequate volume Performed at [place] [address], [place], [place] [address]    Culture   Final    NO GROWTH 5 DAYS Performed at [facility], [place] [address], [place],  [address]    Report Status 12/11/2018 FINAL  Final  Blood Culture ID Panel (Reflexed)     Status: Abnormal   Collection Time: 12/06/18 11:53 AM  Result Value Ref Range Status   Enterococcus species NOT DETECTED NOT DETECTED Final   Listeria monocytogenes NOT DETECTED NOT DETECTED Final   Staphylococcus species  DETECTED (A) NOT DETECTED Final    Comment: CRITICAL RESULT CALLED TO, READ BACK BY AND VERIFIED WITH: T GREEN PHARMD 1514 12/07/18 A BROWNING    Staphylococcus aureus (BCID) DETECTED (A) NOT DETECTED Final    Comment: Methicillin (oxacillin) susceptible Staphylococcus aureus (MSSA). Preferred therapy is anti staphylococcal beta lactam antibiotic (Cefazolin or Nafcillin), unless clinically contraindicated. CRITICAL RESULT CALLED TO, READ BACK BY AND VERIFIED WITH: T GREEN PHARMD 1514 12/07/18 A BROWNING  Methicillin resistance NOT DETECTED NOT DETECTED Final   Streptococcus species NOT DETECTED NOT DETECTED Final   Streptococcus agalactiae NOT DETECTED NOT DETECTED Final   Streptococcus pneumoniae NOT DETECTED NOT DETECTED Final   Streptococcus pyogenes NOT DETECTED NOT DETECTED Final   Acinetobacter baumannii NOT DETECTED NOT DETECTED Final   Enterobacteriaceae species NOT DETECTED NOT DETECTED Final   Enterobacter cloacae complex NOT DETECTED NOT DETECTED Final   Escherichia coli NOT DETECTED NOT DETECTED Final   Klebsiella oxytoca NOT DETECTED NOT DETECTED Final   Klebsiella pneumoniae NOT DETECTED NOT DETECTED Final   Proteus species NOT DETECTED NOT DETECTED Final   Serratia marcescens NOT DETECTED NOT DETECTED Final   Haemophilus influenzae NOT DETECTED NOT DETECTED Final   Neisseria meningitidis NOT DETECTED NOT DETECTED Final   Pseudomonas aeruginosa NOT DETECTED NOT DETECTED Final   Candida albicans NOT DETECTED NOT DETECTED Final   Candida glabrata NOT DETECTED NOT DETECTED Final   Candida krusei NOT DETECTED NOT DETECTED Final   Candida parapsilosis NOT DETECTED NOT DETECTED Final   Candida tropicalis NOT DETECTED NOT DETECTED Final    Comment: Performed at [facility], [place] [address], [place], [place] [address]  MRSA PCR Screening     Status: None   Collection Time: 12/06/18  5:41 PM  Result Value Ref Range Status   MRSA by PCR NEGATIVE NEGATIVE Final    Comment:         The GeneXpert MRSA Assay (FDA approved for NASAL specimens only), is one component of a comprehensive MRSA colonization surveillance program. It is not intended to diagnose MRSA infection nor to guide or monitor treatment for MRSA infections. Performed at [facility], [place] [address], [place], [place] [address]   Culture, blood (routine x 2)     Status: None (Preliminary result)   Collection Time: 12/07/18  6:34 PM  Result Value Ref Range Status   Specimen Description   Final    BLOOD RIGHT HAND Performed at [place] [address] [place], [place] [address]    Special Requests   Final    BOTTLES DRAWN AEROBIC ONLY Blood Culture results may not be optimal due to an inadequate volume of blood received in culture bottles Performed at [facility] [address], [place], [place] [address]    Culture   Final    NO GROWTH 4 DAYS Performed at [facility], [place] [address], [place], [place] [address]    Report Status PENDING  Incomplete  Culture, blood (routine x 2)     Status: None (Preliminary result)   Collection Time: 12/07/18  6:35 PM  Result Value Ref Range Status   Specimen Description   Final    BLOOD RIGHT ARM Performed at [facility] [address], [place], [place] [address]    Special Requests   Final    BOTTLES DRAWN AEROBIC ONLY Blood Culture results may not be optimal due to an inadequate volume of blood received in culture bottles Performed at [facility] [address], [place], [place] [address]    Culture   Final    NO GROWTH 4 DAYS Performed at [facility], [place] [address] [place], [place] [address]    Report Status PENDING  Incomplete  Culture, respiratory     Status: None (Preliminary result)   Collection Time: 12/10/18 11:20 AM  Result Value Ref Range Status   Specimen Description  Final    TRACHEAL ASPIRATE Performed at [facility] [address], [place], [place] [address]    Special Requests   Final    NONE Performed at [facility], [place] [address], [place], [place] [address]    Gram Stain   Final    RARE WBC PRESENT, PREDOMINANTLY PMN FEW GRAM POSITIVE COCCI IN CLUSTERS    Culture   Final    RARE STAPHYLOCOCCUS AUREUS SUSCEPTIBILITIES TO FOLLOW Performed at [facility], [place] [address], [place], [place] [address]    Report Status PENDING  Incomplete    Michel Bickers, MD [facility] for Infectious [place] [phone] pager   [phone] cell 12/11/2018, 1:57 PM

## 2018-12-12 ENCOUNTER — Other Ambulatory Visit: Payer: Self-pay

## 2018-12-12 ENCOUNTER — Inpatient Hospital Stay (HOSPITAL_COMMUNITY): Payer: Medicare Other

## 2018-12-12 LAB — GLUCOSE, CAPILLARY
GLUCOSE-CAPILLARY: 170 mg/dL — AB (ref 70–99)
GLUCOSE-CAPILLARY: 173 mg/dL — AB (ref 70–99)
Glucose-Capillary: 145 mg/dL — ABNORMAL HIGH (ref 70–99)
Glucose-Capillary: 162 mg/dL — ABNORMAL HIGH (ref 70–99)
Glucose-Capillary: 163 mg/dL — ABNORMAL HIGH (ref 70–99)
Glucose-Capillary: 165 mg/dL — ABNORMAL HIGH (ref 70–99)
Glucose-Capillary: 175 mg/dL — ABNORMAL HIGH (ref 70–99)

## 2018-12-12 LAB — CULTURE, RESPIRATORY W GRAM STAIN

## 2018-12-12 LAB — BASIC METABOLIC PANEL
Anion gap: 3 — ABNORMAL LOW (ref 5–15)
BUN: 34 mg/dL — ABNORMAL HIGH (ref 6–20)
CHLORIDE: 111 mmol/L (ref 98–111)
CO2: 29 mmol/L (ref 22–32)
Calcium: 6.3 mg/dL — CL (ref 8.9–10.3)
Creatinine, Ser: 0.66 mg/dL (ref 0.61–1.24)
GFR calc Af Amer: >60 mL/min (ref >60)
GFR calc non Af Amer: >60 mL/min (ref >60)
Glucose, Bld: 175 mg/dL — ABNORMAL HIGH (ref 70–99)
Potassium: 4 mmol/L (ref 3.5–5.1)
Sodium: 143 mmol/L (ref 135–145)

## 2018-12-12 LAB — CULTURE, BLOOD (ROUTINE X 2)
Culture: NO GROWTH
Culture: NO GROWTH

## 2018-12-12 LAB — MAGNESIUM: Magnesium: 2 mg/dL (ref 1.7–2.4)

## 2018-12-12 LAB — BLOOD GAS, ARTERIAL
Acid-Base Excess: 4.6 mmol/L — ABNORMAL HIGH (ref 0.0–2.0)
Bicarbonate: 29.2 mmol/L — ABNORMAL HIGH (ref 20.0–28.0)
Drawn by: 514251
FIO2: 40
LHR: 22 {breaths}/min
MECHVT: 400 mL
O2 Saturation: 96.4 %
PATIENT TEMPERATURE: 97.5
PEEP/CPAP: 5 cmH2O
PO2 ART: 88.7 mmHg (ref 83.0–108.0)
pCO2 arterial: 44.1 mmHg (ref 32.0–48.0)
pH, Arterial: 7.432 (ref 7.350–7.450)

## 2018-12-12 LAB — PHOSPHORUS: Phosphorus: 2.4 mg/dL — ABNORMAL LOW (ref 2.5–4.6)

## 2018-12-12 LAB — CBC
HCT: 27.3 % — ABNORMAL LOW (ref 39.0–52.0)
Hemoglobin: 8.7 g/dL — ABNORMAL LOW (ref 13.0–17.0)
MCH: 24.8 pg — ABNORMAL LOW (ref 26.0–34.0)
MCHC: 31.9 g/dL (ref 30.0–36.0)
MCV: 77.8 fL — AB (ref 80.0–100.0)
Platelets: 98 10*3/uL — ABNORMAL LOW (ref 150–400)
RBC: 3.51 MIL/uL — ABNORMAL LOW (ref 4.22–5.81)
RDW: 16.5 % — ABNORMAL HIGH (ref 11.5–15.5)
WBC: 5 10*3/uL (ref 4.0–10.5)
nRBC: 0.6 % — ABNORMAL HIGH (ref 0.0–0.2)

## 2018-12-12 LAB — ASPERGILLUS ANTIBODY BY IMMUNODIFF
Aspergillus flavus: NEGATIVE
Aspergillus fumigatus, IgG: NEGATIVE
Aspergillus niger: NEGATIVE

## 2018-12-12 LAB — CULTURE, RESPIRATORY

## 2018-12-12 MED ORDER — DEXMEDETOMIDINE HCL IN NACL 200 MCG/50ML IV SOLN
0.0000 ug/kg/h | INTRAVENOUS | Status: DC
  Administered 2018-12-12 (×2): 0.5 ug/kg/h via INTRAVENOUS
  Administered 2018-12-13: 1.2 ug/kg/h via INTRAVENOUS
  Administered 2018-12-13: 0.6 ug/kg/h via INTRAVENOUS
  Filled 2018-12-12: qty 50
  Filled 2018-12-12: qty 100

## 2018-12-12 MED ORDER — SODIUM CHLORIDE 0.9 % IV SOLN
INTRAVENOUS | Status: DC | PRN
  Administered 2018-12-12 – 2018-12-16 (×3): 250 mL via INTRAVENOUS

## 2018-12-12 MED ORDER — POTASSIUM PHOSPHATES 15 MMOLE/5ML IV SOLN
30.0000 mmol | Freq: Once | INTRAVENOUS | Status: AC
  Administered 2018-12-12: 30 mmol via INTRAVENOUS
  Filled 2018-12-12: qty 10

## 2018-12-12 MED ORDER — FUROSEMIDE 10 MG/ML IJ SOLN
40.0000 mg | Freq: Three times a day (TID) | INTRAMUSCULAR | Status: AC
  Administered 2018-12-12 (×2): 40 mg via INTRAVENOUS
  Filled 2018-12-12 (×2): qty 4

## 2018-12-12 NOTE — Progress Notes (Signed)
NAME:  [name] [name], MRN:  [id number], DOB:  1961-02-22, LOS: 5 ADMISSION DATE:  12/06/2018, CONSULTATION DATE: 12/06/2018 REFERRING MD: Dr. 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  Admitted with dyspnea, cough, hemoptysis and chest x-ray evidence for R HCAP  History of present illness   58 year old never smoker with multiple myeloma being treated with Revlimid, Dexamethasone, and Velcade.  Followed in our office by Dr. 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  Also secondary pulmonary hypertension in the setting of all the above.  Managed on Trelegy, oxygen.   He is here with progressive cough, blood-tinged sputum, low-grade fever that began about 3 days prior to admission.  His last myeloma treatment was last Thursday.  Chest x-ray shows possible progression of right lower lobe airspace disease superimposed on his chronic changes.  He has been tachypneic and tachycardic, has an elevated lactate = 4.   Past Medical History   Past Medical History:  Diagnosis Date  . Acute on chronic respiratory failure ([place]) 02/07/2018  . Hypertension   . Kidney stones   . Multiple myeloma ([place])   . Obstructive lung disease (generalized) ([place])   . Pulmonary hypertension ([place])     Significant Hospital Events     Consults:  ID CCM  Procedures:  ETT 2/5>>> R IJ TLC 2/5>>>  Significant Diagnostic Tests:  CT chest 2/2 >>   Micro Data:  Resp 2/2 >> NTD Blood 2/2 >> STAPHYLOCOCCUS AUREUS resistant to erythromycin  Antimicrobials:  Vanco 2/2 >> 2/4 Cefepime 2/2 >>2/4 Ancef 2/4>>> Acyclovir 2/4>>>     Interim history/subjective:  Off pressors, off sedation  Objective   Blood pressure (!) 145/79, pulse (!) 59, temperature 97.9 F (36.6 C), temperature source Axillary, resp. rate (!) 22, height 5' (1.524 m), weight 45.4 kg, SpO2 99 %.    Vent Mode: PRVC FiO2 (%):  [40 %] 40 % Set Rate:  [22 bmp] 22 bmp Vt Set:  [400 mL] 400 mL PEEP:  [5 cmH20] 5 cmH20 Plateau Pressure:  [25 [id number] cmH20] 29 cmH20   Intake/Output Summary (Last 24 hours) at 12/12/2018 0816 Last data filed at 12/12/2018 0800 Gross per 24 hour  Intake 2347.88 ml  Output 800 ml  Net 1547.88 ml   Filed Weights   12/09/18 0400 12/11/18 0317 12/12/18 0500  Weight: 43.9 kg 46.2 kg 45.4 kg   Examination: General: Chronically ill appearing male, NAD HENT: [place]/AT, PERRL, EOM-I and MMM Lungs: Decreased BS diffusely Cardiovascular: RRR, Nl S1/S2 and -M/R/G Abdomen: Soft, NT, ND and +BS Extremities: -edema and -tenderness Neuro: Alert and interactive, moving all ext to command  I reviewed CXR myself, ETT ok and infiltrate noted  Resolved Hospital Problem list   Discussed with PCCM-NP  Assessment & Plan:   Acute on chronic hypoxemic respiratory failure with increased work of breathing - Maintain on PS as tolerated - Active diureses today - Adjust vent for ABG - May need to have a discussion regarding plan of care pending weaning efforts  Right lower lobe healthcare associated pneumonia.  Probably bacterial and due to his known bronchiectasis, immunosuppression due to chemotherapy. 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  I think this will need to be evaluated with further imaging. - Cefazolin and acyclovir per ID - F/U on culture - ID consult appreciated  Severe sepsis - KVO IVF - F/U on cultures - Antibiotics as above - Procal 2.88, ID following  Septic shock: - D/C Levophed - KVO IVF - Abx as above  Hyperkalemia and hyperchloremia: - KVO IVF - BMET in  AM  Bronchiectasis and COPD (alpha-1 antitrypsin deficient) with acute exacerbation - Continue solumedrol - Bronchodilators ordered - F/U alpha 1 as outpatient - Pulmonary hygienes  Left hydropneumothorax, remote VATS decortication for empyema - No need for change in management at this point  Multiple myeloma - Agree with holding Revlimid for now - Acyclovir ordered  Labs   CBC: Recent Labs  Lab 12/07/18 0343 12/08/18 0309 12/10/18 0309 12/11/18 0330 12/12/18 0400  WBC 12.1* 10.8* 3.4* 2.8* 5.0  HGB 10.6* 9.7* 9.7* 8.6* 8.7*  HCT 34.5* 30.1* 31.1* 26.8* 27.3*  MCV 81.4 75.6* 79.1* 77.0* 77.8*  PLT 112* 88* 62* 90* 98*    Basic Metabolic Panel: Recent Labs  Lab 12/08/18 0309 12/09/18 1245 12/10/18 0309 12/11/18 0330 12/12/18 0400  NA 139 141 144 143 143  K 3.2* 5.4* 5.4* 4.4 4.0  CL 116* 116* 118* 114* 111  CO2 17* 21* 20* 26 29  GLUCOSE 133* 160* 155* 164* 175*  BUN 18 33* 34* 35* 34*  CREATININE 0.76 0.90 1.00 0.87 0.66  CALCIUM 6.9* 7.0* 7.2* 6.6* 6.3*  MG 1.9  --  2.2 2.1 2.0  PHOS 1.8*  --  1.3* 1.7* 2.4*   GFR: Estimated Creatinine Clearance: 66.2 mL/min (by C-G formula based on SCr of 0.66 mg/dL). Recent Labs  Lab 12/06/18 1153 12/06/18 1434 12/07/18 0343 12/08/18 0309 12/09/18 0446 12/10/18 0309 12/11/18 0330 12/12/18 0400  PROCALCITON  --   --  3.89 2.88 1.59  --   --   --   WBC  --   --  12.1* 10.8*  --  3.4* 2.8* 5.0  LATICACIDVEN 4.0* 3.2*  --   --   --   --   --   --     Liver Function Tests: Recent Labs  Lab 12/09/18 1245  AST 157*  ALT 74*  ALKPHOS 45  BILITOT 0.5  PROT 5.4*  ALBUMIN 2.3*   No results for input(s): LIPASE, AMYLASE in the last 168 hours. No results for input(s): AMMONIA in the last 168 hours.  ABG    Component Value Date/Time   PHART 7.432 12/12/2018 0345   PCO2ART 44.1 12/12/2018 0345   PO2ART 88.7 12/12/2018 0345   HCO3 29.2 (H) 12/12/2018 0345   TCO2 32 06/24/2016 0902   ACIDBASEDEF 0.4  12/10/2018 0335   O2SAT 96.4 12/12/2018 0345     Coagulation Profile: No results for input(s): INR, PROTIME in the last 168 hours.  Cardiac Enzymes: No results for input(s): CKTOTAL, CKMB, CKMBINDEX, TROPONINI in the last 168 hours.  HbA1C: No results found for: HGBA1C  CBG: Recent Labs  Lab 12/11/18 1641 12/11/18 1954 12/12/18 0014 12/12/18 0430 12/12/18 0741  GLUCAP 199* 169* 173* 162* 163*    Review of Systems:   Some nausea, poor p.o. intake, emesis for 2 to 3 days Cough with mid chest discomfort that is somewhat musculoskeletal, productive of yellow-tinged with blood Low-grade fever No diarrhea  Past Medical History  He,  has a past medical history of Acute on chronic respiratory failure ([place]) (02/07/2018), Hypertension, Kidney stones, Multiple myeloma (  [place]), Obstructive lung disease (generalized) ([place]), and Pulmonary hypertension ([place]).   Surgical History    Past Surgical History:  Procedure Laterality Date  . CARDIAC CATHETERIZATION N/A 01/18/2016   Procedure: Right Heart Cath;  Surgeon: Jolaine Artist, MD;  Location: [facility];  Service: Cardiovascular;  Laterality: N/A;  . CARDIAC CATHETERIZATION N/A 06/24/2016   Procedure: Right Heart Cath;  Surgeon: Jolaine Artist, MD;  Location: [facility];  Service: Cardiovascular;  Laterality: N/A;  . IR RADIOLOGIST EVAL & MGMT  05/27/2018  . IR RADIOLOGIST EVAL & MGMT  09/01/2018  . KIDNEY SURGERY    . VIDEO ASSISTED THORACOSCOPY (VATS)/DECORTICATION Left 2003  . VIDEO ASSISTED THORACOSCOPY (VATS)/DECORTICATION Left 10/02/2015   Procedure: VIDEO ASSISTED THORACOSCOPY (VATS)/DECORTICATION and drainage of chronic empyena;  Surgeon: [name], MD;  Location: [facility];  Service: Thoracic;  Laterality: Left;  Marland Kitchen VIDEO BRONCHOSCOPY N/A 10/02/2015   Procedure: VIDEO BRONCHOSCOPY;  Surgeon: [name], MD;  Location: [facility];  Service: Thoracic;  Laterality: N/A;     Social History   reports that  he has never smoked. He has never used smokeless tobacco. He reports current alcohol use. He reports that he does not use drugs.   Family History   His family history includes Hypertension in an other family member.   Allergies No Known Allergies   Home Medications  Prior to Admission medications   Medication Sig Start Date End Date Taking? Authorizing Provider  acyclovir (ZOVIRAX) 400 MG tablet Take 1 tablet (400 mg total) by mouth daily. 11/18/18  Yes Brunetta Genera, MD  aspirin EC 81 MG tablet Take 1 tablet (81 mg total) by mouth daily. 11/18/18  Yes Brunetta Genera, MD  dexamethasone (DECADRON) 4 MG tablet Take 5 tablets (20 mg total) by mouth once a week. 11/18/18  Yes Brunetta Genera, MD  docusate sodium (COLACE) 50 MG capsule Take 1 capsule (50 mg total) by mouth daily. 03/06/18  Yes [name], PA-C  Fluticasone-Umeclidin-Vilant (TRELEGY ELLIPTA) 100-62.5-25 MCG/INH AEPB Inhale 1 puff into the lungs daily. 07/31/18  Yes Tanda Rockers, MD  lenalidomide (REVLIMID) 25 MG capsule Take 1 capsule (25 mg total) by mouth daily. Take for 21 days and take 7 days off. Authorization#[id number] 10/19/18 11/18/18  Yes Brunetta Genera, MD  omeprazole (PRILOSEC) 40 MG capsule Take 1 capsule (40 mg total) by mouth daily. 08/20/18  Yes Brunetta Genera, MD  oxyCODONE-acetaminophen (PERCOCET/ROXICET) 5-325 MG tablet Take 1 tablet by mouth every 6 (six) hours as needed for severe pain. 08/27/18  Yes Brunetta Genera, MD  OXYGEN 3L BEDTIME ONLY   Yes [provider]  PROVENTIL HFA 108 (90 Base) MCG/ACT inhaler INHALE 2 PUFFS EVERY 6 HOURS AS NEEDED FOR WHEEZING OR SHORTNESS OF BREATH. 10/20/18  Yes Juanito Doom, MD  guaiFENesin (MUCINEX) 600 MG 12 hr tablet Take 2 tablets (1,200 mg total) by mouth 2 (two) times daily. Patient not taking: Reported on 12/06/2018 09/05/18   Oswald Hillock, MD  nortriptyline (PAMELOR) 25 MG capsule Take 1 capsule (25 mg total) by mouth  at bedtime. Patient not taking: Reported on 12/06/2018 08/20/18   Brunetta Genera, MD  ondansetron (ZOFRAN) 8 MG tablet Take 1 tablet (8 mg total) by mouth every 8 (eight) hours as needed for nausea. Patient not taking: Reported on 12/06/2018 09/23/18   Brunetta Genera, MD    The patient is critically ill with multiple organ systems failure  and requires high complexity decision making for assessment and support, frequent evaluation and titration of therapies, application of advanced monitoring technologies and extensive interpretation of multiple databases.   Critical Care Time devoted to patient care services described in this note is  32  Minutes. This time reflects time of care of this signee Dr Jennet Maduro. This critical care time does not reflect procedure time, or teaching time or supervisory time of PA/NP/Med student/Med Resident etc but could involve care discussion time.  Rush Farmer, M.D. [facility] Pulmonary/Critical Care Medicine. Pager: [phone]. After hours pager: [phone].

## 2018-12-12 NOTE — Progress Notes (Signed)
CRITICAL VALUE ALERT  Critical Value:  Calcium 6.3  Date & Time Notied:  12/12/2018 0530  Provider Notified: eLink  Orders Received/Actions taken: Awaiting orders   Mariann Laster, RN

## 2018-12-12 NOTE — Progress Notes (Signed)
Patient noted to be tachycardic up to the 240's at approx 1215. Pt has otherwise been hemodynamically stable and weaning since 0815. Pt converted back within minutes and is now back in NSR. Episode is associated with a BM. Pt with no complaints of CP or signs of distress. Dr. Nelda Marseille made aware.

## 2018-12-13 ENCOUNTER — Inpatient Hospital Stay (HOSPITAL_COMMUNITY): Payer: Medicare Other

## 2018-12-13 LAB — GLUCOSE, CAPILLARY
Glucose-Capillary: 154 mg/dL — ABNORMAL HIGH (ref 70–99)
Glucose-Capillary: 120 mg/dL — ABNORMAL HIGH (ref 70–99)
Glucose-Capillary: 152 mg/dL — ABNORMAL HIGH (ref 70–99)
Glucose-Capillary: 100 mg/dL — ABNORMAL HIGH (ref 70–99)
Glucose-Capillary: 124 mg/dL — ABNORMAL HIGH (ref 70–99)

## 2018-12-13 LAB — BASIC METABOLIC PANEL
Anion gap: 5 (ref 5–15)
BUN: 34 mg/dL — ABNORMAL HIGH (ref 6–20)
CO2: 37 mmol/L — AB (ref 22–32)
Calcium: 6.3 mg/dL — CL (ref 8.9–10.3)
Chloride: 105 mmol/L (ref 98–111)
Creatinine, Ser: 0.69 mg/dL (ref 0.61–1.24)
GFR calc Af Amer: >60 mL/min (ref >60)
GFR calc non Af Amer: >60 mL/min (ref >60)
GLUCOSE: 154 mg/dL — AB (ref 70–99)
Potassium: 3.2 mmol/L — ABNORMAL LOW (ref 3.5–5.1)
Sodium: 147 mmol/L — ABNORMAL HIGH (ref 135–145)

## 2018-12-13 LAB — CBC
HCT: 26.6 % — ABNORMAL LOW (ref 39.0–52.0)
Hemoglobin: 8.5 g/dL — ABNORMAL LOW (ref 13.0–17.0)
MCH: 24.9 pg — ABNORMAL LOW (ref 26.0–34.0)
MCHC: 32 g/dL (ref 30.0–36.0)
MCV: 78 fL — AB (ref 80.0–100.0)
Platelets: 117 10*3/uL — ABNORMAL LOW (ref 150–400)
RBC: 3.41 MIL/uL — ABNORMAL LOW (ref 4.22–5.81)
RDW: 16.4 % — AB (ref 11.5–15.5)
WBC: 8.4 10*3/uL (ref 4.0–10.5)
nRBC: 0.2 % (ref 0.0–0.2)

## 2018-12-13 LAB — PHOSPHORUS: Phosphorus: 2.1 mg/dL — ABNORMAL LOW (ref 2.5–4.6)

## 2018-12-13 LAB — MAGNESIUM: Magnesium: 1.8 mg/dL (ref 1.7–2.4)

## 2018-12-13 MED ORDER — INSULIN ASPART 100 UNIT/ML ~~LOC~~ SOLN
2.0000 [IU] | SUBCUTANEOUS | Status: DC
  Administered 2018-12-14: 4 [IU] via SUBCUTANEOUS
  Administered 2018-12-15: 2 [IU] via SUBCUTANEOUS

## 2018-12-13 MED ORDER — INSULIN ASPART 100 UNIT/ML ~~LOC~~ SOLN: 2.0000 [IU] | SUBCUTANEOUS | Status: DC

## 2018-12-13 MED ORDER — POTASSIUM CHLORIDE 10 MEQ/50ML IV SOLN
10.0000 meq | INTRAVENOUS | Status: AC
  Administered 2018-12-13 (×4): 10 meq via INTRAVENOUS
  Filled 2018-12-13 (×3): qty 50

## 2018-12-13 NOTE — Progress Notes (Signed)
CRITICAL VALUE ALERT  Critical Value:  Calcium 6.3  Date & Time Notied:  12/13/2018 0630  Provider Notified: Warren Lacy  Orders Received/Actions taken: Awaiting orders   Mariann Laster, RN

## 2018-12-13 NOTE — Progress Notes (Signed)
NAMENuman Zylstra, MRN:  [id number], DOB:  February 03, 1961, LOS: 6 ADMISSION DATE:  12/06/2018, CONSULTATION DATE: 12/06/2018 REFERRING MD: Dr. 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  Admitted with dyspnea, cough, hemoptysis and chest x-ray evidence for R HCAP  History of present illness   58 year old never smoker with multiple myeloma being treated with Revlimid, Dexamethasone, and Velcade.  Followed in our office by Dr. 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  Also secondary pulmonary hypertension in the setting of all the above.  Managed on Trelegy, oxygen.   He is here with progressive cough, blood-tinged sputum, low-grade fever that began about 3 days prior to admission.  His last myeloma treatment was last Thursday.  Chest x-ray shows possible progression of right lower lobe airspace disease superimposed on his chronic changes.  He has been tachypneic and tachycardic, has an elevated lactate = 4.   Past Medical History   Past Medical History:  Diagnosis Date  . Acute on chronic respiratory failure ([place]) 02/07/2018  . Hypertension   . Kidney stones   . Multiple myeloma ([place])   . Obstructive lung disease (generalized) ([place])   . Pulmonary hypertension ([place])     Significant Hospital Events     Consults:  ID CCM  Procedures:  ETT 2/5>>> R IJ TLC 2/5>>>  Significant Diagnostic Tests:  CT chest 2/2 >>   Micro Data:  Resp 2/2 >> NTD Blood 2/2 >> STAPHYLOCOCCUS AUREUS resistant to erythromycin  Antimicrobials:  Vanco 2/2 >> 2/4 Cefepime 2/2 >>2/4 Ancef 2/4>>> Acyclovir 2/4>>>     Interim history/subjective:  No events overnight, weaning well this AM  Objective   Blood pressure 139/77, pulse 71, temperature 97.9 F (36.6 C), temperature source Axillary, resp. rate (!) 22, height 5' (1.524 m), weight 45.6 kg, SpO2 100 %.    Vent Mode: PRVC FiO2 (%):  [30 %-40 %] 30 % Set Rate:  [22 bmp] 22 bmp Vt Set:  [400 mL] 400 mL PEEP:  [5 cmH20] 5 cmH20 Pressure Support:  [5 cmH20] 5 cmH20 Plateau Pressure:  [23 cmH20-36 cmH20] 23 cmH20   Intake/Output Summary (Last 24 hours) at 12/13/2018 1048 Last data filed at 12/13/2018 1000 Gross per 24 hour  Intake 2430.01 ml  Output 4175 ml  Net -1744.99 ml   Filed Weights   12/12/18 0500 12/13/18 0450 12/13/18 0452  Weight: 45.4 kg 45.6 kg 45.6 kg   Examination: General: Chronically ill appearing male, intubated HENT: [place]/AT, PERRL, EOM-I and MMM Lungs: Decreased BS diffusely Cardiovascular: RRR, Nl S1/S2 and -M/R/G Abdomen: Soft, NT, ND and +BS Extremities: -edema and -tenderness Neuro: Alert and interactive, moving all ext to command  I reviewed CXR myself, ETT is in a good position, infiltrate noted  Resolved Hospital Problem list   Discussed with PCCM-NP  Assessment & Plan:   Acute on chronic hypoxemic respiratory failure with increased work of breathing - Extubate today - Hold off further diureses at this point - Titrate O2 for sat of 88-92% - PT evaluation - OOB  Right lower lobe healthcare associated pneumonia.  Probably bacterial and due to his known bronchiectasis, immunosuppression due to chemotherapy. 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  I think this will need to be evaluated with further imaging. - Cefazolin and acyclovir per ID - F/U on culture - ID consult appreciated  Severe sepsis - KVO IVF - F/U on cultures - Antibiotics as above - Procal 2.88, ID following  Septic shock: - D/C levophed - Lasix as ordered - KVO IVF - Abx as  above  Hyperkalemia and hyperchloremia: - KVO IVF - BMET in AM  Bronchiectasis and COPD (alpha-1 antitrypsin deficient) with acute exacerbation - Continue solumedrol for now - Will need to to change to PO when more stable - Bronchodilators ordered - F/U alpha 1 as outpatient - Pulmonary hygienes  Left hydropneumothorax, remote VATS decortication for empyema - No need for change in management at this point  Multiple myeloma - Agree with holding Revlimid for now - Acyclovir ordered  Labs   CBC: Recent Labs  Lab 12/08/18 0309 12/10/18 0309 12/11/18 0330 12/12/18 0400 12/13/18 0500  WBC 10.8* 3.4* 2.8* 5.0 8.4  HGB 9.7* 9.7* 8.6* 8.7* 8.5*  HCT 30.1* 31.1* 26.8* 27.3* 26.6*  MCV 75.6* 79.1* 77.0* 77.8* 78.0*  PLT 88* 62* 90* 98* 117*    Basic Metabolic Panel: Recent Labs  Lab 12/08/18 0309 12/09/18 1245 12/10/18 0309 12/11/18 0330 12/12/18 0400 12/13/18 0500  NA 139 141 144 143 143 147*  K 3.2* 5.4* 5.4* 4.4 4.0 3.2*  CL 116* 116* 118* 114* 111 105  CO2 17* 21* 20* 26 29 37*  GLUCOSE 133* 160* 155* 164* 175* 154*  BUN 18 33* 34* 35* 34* 34*  CREATININE 0.76 0.90 1.00 0.87 0.66 0.69  CALCIUM 6.9* 7.0* 7.2* 6.6* 6.3* 6.3*  MG 1.9  --  2.2 2.1 2.0 1.8  PHOS 1.8*  --  1.3* 1.7* 2.4* 2.1*   GFR: Estimated Creatinine Clearance: 66.5 mL/min (by C-G formula based on SCr of 0.69 mg/dL). Recent Labs  Lab 12/06/18 1153 12/06/18 1434  12/07/18 0343 12/08/18 0309 12/09/18 0446 12/10/18 0309 12/11/18 0330 12/12/18 0400 12/13/18 0500  PROCALCITON  --   --   --  3.89 2.88 1.59  --   --   --   --   WBC  --   --    < > 12.1* 10.8*  --  3.4* 2.8* 5.0 8.4  LATICACIDVEN 4.0* 3.2*  --   --   --   --   --   --   --   --    < > = values in this interval not displayed.    Liver Function Tests: Recent Labs  Lab 12/09/18 1245  AST 157*  ALT 74*  ALKPHOS 45  BILITOT 0.5  PROT 5.4*  ALBUMIN 2.3*   No results for input(s): LIPASE, AMYLASE in the last 168 hours. No  results for input(s): AMMONIA in the last 168 hours.  ABG    Component Value Date/Time   PHART 7.432 12/12/2018 0345   PCO2ART 44.1 12/12/2018 0345   PO2ART 88.7 12/12/2018 0345   HCO3 29.2 (H) 12/12/2018 0345   TCO2 32 06/24/2016 0902   ACIDBASEDEF 0.4 12/10/2018 0335   O2SAT 96.4 12/12/2018 0345     Coagulation Profile: No results for input(s): INR, PROTIME in the last 168 hours.  Cardiac Enzymes: No results for input(s): CKTOTAL, CKMB, CKMBINDEX, TROPONINI in the last 168 hours.  HbA1C: No results found for: HGBA1C  CBG: Recent Labs  Lab 12/12/18 1645 12/12/18 1936 12/12/18 2332 12/13/18 0418 12/13/18 0718  GLUCAP 170*  145* 165* 154* 120*    Review of Systems:   Some nausea, poor p.o. intake, emesis for 2 to 3 days Cough with mid chest discomfort that is somewhat musculoskeletal, productive of yellow-tinged with blood Low-grade fever No diarrhea  Past Medical History  He,  has a past medical history of Acute on chronic respiratory failure ([place]) (02/07/2018), Hypertension, Kidney stones, Multiple myeloma ([place]), Obstructive lung disease (generalized) ([place]), and Pulmonary hypertension ([place]).   Surgical History    Past Surgical History:  Procedure Laterality Date  . CARDIAC CATHETERIZATION N/A 01/18/2016   Procedure: Right Heart Cath;  Surgeon: Jolaine Artist, MD;  Location: [place] [facility];  Service: Cardiovascular;  Laterality: N/A;  . CARDIAC CATHETERIZATION N/A 06/24/2016   Procedure: Right Heart Cath;  Surgeon: Jolaine Artist, MD;  Location: [place] [facility];  Service: Cardiovascular;  Laterality: N/A;  . IR RADIOLOGIST EVAL & MGMT  05/27/2018  . IR RADIOLOGIST EVAL & MGMT  09/01/2018  . KIDNEY SURGERY    . VIDEO ASSISTED THORACOSCOPY (VATS)/DECORTICATION Left 2003  . VIDEO ASSISTED THORACOSCOPY (VATS)/DECORTICATION Left 10/02/2015   Procedure: VIDEO ASSISTED THORACOSCOPY (VATS)/DECORTICATION and drainage of chronic empyena;  Surgeon: [name], MD;  Location: [facility];  Service: Thoracic;  Laterality: Left;  Marland Kitchen VIDEO BRONCHOSCOPY N/A 10/02/2015   Procedure: VIDEO BRONCHOSCOPY;  Surgeon: [name], MD;  Location: [place];  Service: Thoracic;  Laterality: N/A;     Social History   reports that he has never smoked. He has never used smokeless tobacco. He reports current alcohol use. He reports that he does not use drugs.   Family History   His family history includes Hypertension in an other family member.   Allergies No Known Allergies   Home Medications  Prior to Admission medications   Medication Sig Start Date End Date Taking? Authorizing Provider  acyclovir (ZOVIRAX) 400 MG tablet Take 1 tablet (400 mg total) by mouth daily. 11/18/18  Yes Brunetta Genera, MD  aspirin EC 81 MG tablet Take 1 tablet (81 mg total) by mouth daily. 11/18/18  Yes Brunetta Genera, MD  dexamethasone (DECADRON) 4 MG tablet Take 5 tablets (20 mg total) by mouth once a week. 11/18/18  Yes Brunetta Genera, MD  docusate sodium (COLACE) 50 MG capsule Take 1 capsule (50 mg total) by mouth daily. 03/06/18  Yes [name], PA-C  Fluticasone-Umeclidin-Vilant (TRELEGY ELLIPTA) 100-62.5-25 MCG/INH AEPB Inhale 1 puff into the lungs daily. 07/31/18  Yes Tanda Rockers, MD  lenalidomide (REVLIMID) 25 MG capsule Take 1 capsule (25 mg total) by mouth daily. Take for 21 days and take 7 days off. Authorization#[id number] 10/19/18 11/18/18  Yes Brunetta Genera, MD  omeprazole (PRILOSEC) 40 MG capsule Take 1 capsule (40 mg total) by mouth daily. 08/20/18  Yes Brunetta Genera, MD  oxyCODONE-acetaminophen (PERCOCET/ROXICET) 5-325 MG tablet Take 1 tablet by mouth every 6 (six) hours as needed for severe pain. 08/27/18  Yes Brunetta Genera, MD  OXYGEN 3L BEDTIME ONLY   Yes [provider]  PROVENTIL HFA 108 (90 Base) MCG/ACT inhaler INHALE 2 PUFFS EVERY 6 HOURS AS NEEDED FOR WHEEZING OR SHORTNESS OF BREATH. 10/20/18  Yes Juanito Doom, MD  guaiFENesin (MUCINEX) 600 MG 12 hr tablet Take 2 tablets (1,200 mg total) by mouth 2 (two) times daily. Patient not taking: Reported on 12/06/2018 09/05/18   Oswald Hillock, MD  nortriptyline (PAMELOR) 25 MG capsule Take 1 capsule (25 mg  total) by mouth at bedtime. Patient not taking: Reported on 12/06/2018 08/20/18   Brunetta Genera, MD  ondansetron (ZOFRAN) 8 MG tablet Take 1 tablet (8 mg total) by mouth every 8 (eight) hours as needed for nausea. Patient not taking: Reported on 12/06/2018 09/23/18   Brunetta Genera, MD    The patient is critically ill with multiple organ systems failure and requires high complexity decision making for assessment and support, frequent evaluation and titration of therapies, application of advanced monitoring technologies and extensive interpretation of multiple databases.   Critical Care Time devoted to patient care services described in this note is  33  Minutes. This time reflects time of care of this signee Dr Jennet Maduro. This critical care time does not reflect procedure time, or teaching time or supervisory time of PA/NP/Med student/Med Resident etc but could involve care discussion time.  Rush Farmer, M.D. [facility] Pulmonary/Critical Care Medicine. Pager: [phone]. After hours pager: [phone].

## 2018-12-13 NOTE — Procedures (Signed)
Extubation Procedure Note  Patient Details:   Name: [name] [name] DOB: 1961/08/31 MRN: [id number]   Airway Documentation:  Airway 7.5 mm (Active)  Secured at (cm) 24 cm 12/13/2018  8:00 AM  Measured From Lips 12/13/2018  8:00 AM  Secured Location Left 12/13/2018  7:32 AM  Secured By [employer] 12/13/2018  8:00 AM  Tube Holder Repositioned Yes 12/13/2018  7:32 AM  Cuff Pressure (cm H2O) 27 cm H2O 12/13/2018  7:32 AM  Site Condition Dry 12/13/2018  8:00 AM   Vent end date: (not recorded) Vent end time: (not recorded)   Evaluation  O2 sats: 681 Complications: none Patient tolerated procedure well. Bilateral Breath Sounds: Rhonchi, Coarse crackles   Pt able to speak  Per CCM verbal order pt extubated.  Tolerated well, no complications. Placed on Nasal cannula.  Martha Clan 12/13/2018, 9:58 AM

## 2018-12-13 NOTE — Progress Notes (Signed)
[facility] ADULT ICU REPLACEMENT PROTOCOL FOR AM LAB REPLACEMENT ONLY  The patient does apply for the [facility] Adult ICU Electrolyte Replacment Protocol based on the criteria listed below:   1. Is GFR >/= 40 ml/min? Yes.    Patient's GFR today is >60 2. Is urine output >/= 0.5 ml/kg/hr for the last 6 hours? Yes.   Patient's UOP is 2.1 ml/kg/hr 3. Is BUN < 60 mg/dL? Yes.    Patient's BUN today is 34 4. Abnormal electrolyte(s): K 3.2 5. Ordered repletion with: protocol 6. If a panic level lab has been reported, has the CCM MD in charge been notified? No..   Physician:    Ronda Fairly A 12/13/2018 6:50 AM

## 2018-12-14 ENCOUNTER — Encounter (HOSPITAL_COMMUNITY): Payer: Self-pay

## 2018-12-14 ENCOUNTER — Inpatient Hospital Stay (HOSPITAL_COMMUNITY): Payer: Medicare Other

## 2018-12-14 DIAGNOSIS — E870 Hyperosmolality and hypernatremia: Secondary | ICD-10-CM

## 2018-12-14 DIAGNOSIS — D509 Iron deficiency anemia, unspecified: Secondary | ICD-10-CM

## 2018-12-14 LAB — CBC
HCT: 27.2 % — ABNORMAL LOW (ref 39.0–52.0)
Hemoglobin: 8.5 g/dL — ABNORMAL LOW (ref 13.0–17.0)
MCH: 24.8 pg — ABNORMAL LOW (ref 26.0–34.0)
MCHC: 31.3 g/dL (ref 30.0–36.0)
MCV: 79.3 fL — ABNORMAL LOW (ref 80.0–100.0)
NRBC: 0.5 % — AB (ref 0.0–0.2)
Platelets: 177 10*3/uL (ref 150–400)
RBC: 3.43 MIL/uL — ABNORMAL LOW (ref 4.22–5.81)
RDW: 16.4 % — ABNORMAL HIGH (ref 11.5–15.5)
WBC: 7.4 10*3/uL (ref 4.0–10.5)

## 2018-12-14 LAB — BASIC METABOLIC PANEL
Anion gap: 6 (ref 5–15)
BUN: 30 mg/dL — ABNORMAL HIGH (ref 6–20)
CO2: 34 mmol/L — ABNORMAL HIGH (ref 22–32)
Calcium: 6.5 mg/dL — ABNORMAL LOW (ref 8.9–10.3)
Chloride: 108 mmol/L (ref 98–111)
Creatinine, Ser: 0.65 mg/dL (ref 0.61–1.24)
Glucose, Bld: 119 mg/dL — ABNORMAL HIGH (ref 70–99)
Potassium: 3.6 mmol/L (ref 3.5–5.1)
SODIUM: 148 mmol/L — AB (ref 135–145)

## 2018-12-14 LAB — GLUCOSE, CAPILLARY
GLUCOSE-CAPILLARY: 155 mg/dL — AB (ref 70–99)
Glucose-Capillary: 103 mg/dL — ABNORMAL HIGH (ref 70–99)
Glucose-Capillary: 113 mg/dL — ABNORMAL HIGH (ref 70–99)
Glucose-Capillary: 113 mg/dL — ABNORMAL HIGH (ref 70–99)
Glucose-Capillary: 135 mg/dL — ABNORMAL HIGH (ref 70–99)
Glucose-Capillary: 155 mg/dL — ABNORMAL HIGH (ref 70–99)
Glucose-Capillary: 94 mg/dL (ref 70–99)

## 2018-12-14 LAB — MAGNESIUM: Magnesium: 2 mg/dL (ref 1.7–2.4)

## 2018-12-14 LAB — PHOSPHORUS: PHOSPHORUS: 2.1 mg/dL — AB (ref 2.5–4.6)

## 2018-12-14 LAB — MISC LABCORP TEST (SEND OUT)

## 2018-12-14 MED ORDER — OXYCODONE-ACETAMINOPHEN 5-325 MG PO TABS
1.0000 | ORAL_TABLET | Freq: Four times a day (QID) | ORAL | Status: DC | PRN
  Administered 2018-12-19: 1 via ORAL
  Filled 2018-12-14: qty 1

## 2018-12-14 MED ORDER — ACYCLOVIR 400 MG PO TABS
400.0000 mg | ORAL_TABLET | Freq: Every day | ORAL | Status: DC
  Administered 2018-12-14 – 2018-12-19 (×6): 400 mg via ORAL
  Filled 2018-12-14 (×6): qty 1

## 2018-12-14 MED ORDER — ACETAMINOPHEN 160 MG/5ML PO SOLN: 650.0000 mg | Freq: Four times a day (QID) | ORAL | Status: DC | PRN

## 2018-12-14 MED ORDER — ONDANSETRON HCL 4 MG/2ML IJ SOLN: 4.0000 mg | Freq: Four times a day (QID) | INTRAMUSCULAR | Status: DC | PRN

## 2018-12-14 MED ORDER — ACETAMINOPHEN 650 MG RE SUPP: 650.0000 mg | RECTAL | Status: DC | PRN

## 2018-12-14 MED ORDER — IPRATROPIUM-ALBUTEROL 0.5-2.5 (3) MG/3ML IN SOLN
3.0000 mL | Freq: Four times a day (QID) | RESPIRATORY_TRACT | Status: DC
  Administered 2018-12-14 – 2018-12-16 (×8): 3 mL via RESPIRATORY_TRACT
  Filled 2018-12-14 (×8): qty 3

## 2018-12-14 MED ORDER — ASPIRIN 81 MG PO CHEW
81.0000 mg | CHEWABLE_TABLET | Freq: Every day | ORAL | Status: DC
  Administered 2018-12-15 – 2018-12-19 (×5): 81 mg via ORAL
  Filled 2018-12-14 (×6): qty 1

## 2018-12-14 MED ORDER — CHLORHEXIDINE GLUCONATE 0.12 % MT SOLN
15.0000 mL | Freq: Two times a day (BID) | OROMUCOSAL | Status: DC
  Administered 2018-12-14 – 2018-12-17 (×8): 15 mL via OROMUCOSAL
  Filled 2018-12-14 (×7): qty 15

## 2018-12-14 MED ORDER — PANTOPRAZOLE SODIUM 40 MG PO PACK
40.0000 mg | PACK | Freq: Every day | ORAL | Status: DC
  Administered 2018-12-15 – 2018-12-19 (×5): 40 mg via ORAL
  Filled 2018-12-14 (×5): qty 20

## 2018-12-14 MED ORDER — ONDANSETRON HCL 4 MG PO TABS: 4.0000 mg | ORAL_TABLET | Freq: Four times a day (QID) | ORAL | Status: DC | PRN

## 2018-12-14 MED ORDER — ADULT MULTIVITAMIN LIQUID CH
15.0000 mL | Freq: Every day | ORAL | Status: DC
  Administered 2018-12-15 – 2018-12-19 (×5): 15 mL via ORAL
  Filled 2018-12-14 (×5): qty 15

## 2018-12-14 MED ORDER — ORAL CARE MOUTH RINSE
15.0000 mL | Freq: Two times a day (BID) | OROMUCOSAL | Status: DC
  Administered 2018-12-14 – 2018-12-19 (×9): 15 mL via OROMUCOSAL

## 2018-12-14 MED ORDER — GUAIFENESIN 100 MG/5ML PO SOLN: 5.0000 mL | ORAL | Status: DC | PRN

## 2018-12-14 NOTE — Progress Notes (Addendum)
Patient ID: [name] [name], male   DOB: May 15, 1961, 58 y.o.   MRN: [id number]          [facility]    Date of Admission:  12/06/2018    Total days of antibiotics 9        Day 7 cefazolin  He is improving on therapy for MSSA pneumonia complicated by transient bacteremia.  Only 1 of 2 admission blood cultures were negative.  Repeat blood cultures on 12/07/2018 were negative within 36 hours of admission.  No vegetations were seen on transthoracic echocardiogram.  He is quite thin and it sounds like the study was on fairly high quality.  While it might be optimal to have a TEE to help guide optimal duration of therapy I am not absolutely convinced that it is worth it.  Another option would be to treat him for a total of 3 weeks with IV cefazolin, then monitor closely and repeat blood cultures in 1 to 2 weeks.  His aspergillus antibodies were negative.         Michel Bickers, MD [facility] for Infectious [place] [facility] [phone] pager   [phone] cell 12/14/2018, 5:22 PM

## 2018-12-14 NOTE — Evaluation (Signed)
Clinical/Bedside Swallow Evaluation Patient Details  Name: [name] [name] MRN: [id number] Date of Birth: 1960-12-28  Today's Date: 12/14/2018 Time: SLP Start Time (ACUTE ONLY): 7793 SLP Stop Time (ACUTE ONLY): 1235 SLP Time Calculation (min) (ACUTE ONLY): 31 min  Past Medical History:  Past Medical History:  Diagnosis Date  . Acute on chronic respiratory failure ([place]) 02/07/2018  . Hypertension   . Kidney stones   . Multiple myeloma ([place])   . Obstructive lung disease (generalized) ([place])   . Pulmonary hypertension ([place])    Past Surgical History:  Past Surgical History:  Procedure Laterality Date  . CARDIAC CATHETERIZATION N/A 01/18/2016   Procedure: Right Heart Cath;  Surgeon: Jolaine Artist, MD;  Location: [place] [facility];  Service: Cardiovascular;  Laterality: N/A;  . CARDIAC CATHETERIZATION N/A 06/24/2016   Procedure: Right Heart Cath;  Surgeon: Jolaine Artist, MD;  Location: [place] [facility];  Service: Cardiovascular;  Laterality: N/A;  . IR RADIOLOGIST EVAL & MGMT  05/27/2018  . IR RADIOLOGIST EVAL & MGMT  09/01/2018  . KIDNEY SURGERY    . VIDEO ASSISTED THORACOSCOPY (VATS)/DECORTICATION Left 2003  . VIDEO ASSISTED THORACOSCOPY (VATS)/DECORTICATION Left 10/02/2015   Procedure: VIDEO ASSISTED THORACOSCOPY (VATS)/DECORTICATION and drainage of chronic empyena;  Surgeon: [name], MD;  Location: [place];  Service: Thoracic;  Laterality: Left;  Marland Kitchen VIDEO BRONCHOSCOPY N/A 10/02/2015   Procedure: VIDEO BRONCHOSCOPY;  Surgeon: [name], MD;  Location: [place];  Service: Thoracic;  Laterality: N/A;   HPI:  Pt adm too [place] with difficulty breathing.  Pt has h/o bronchiectasis, COPD, Alpha 1 MZ.  He required intubation from 2/5-12/13/2018.  CXR showing increased right lower lobe pna. Swallow evaluation ordered.     Assessment / Plan / Recommendation Clinical Impression  SLP attempted to call for interpreter - none available on our phone service [phone] and phone number  on board was a private company.  Patient presents with clinical indications concerning for pharyngeal dysphagia/possible aspiration.  Pt pointed to pharynx to indicate area of residuals.  He demonstrated delayed cough with expectoration of viscous secretions. RR up to 30s - pt desires po intake.  Will proceed with MBS to assure pt on LRD and protecting his airway given his 5 days intubation given Alpha 1 MZ SLP Visit Diagnosis: Dysphagia, unspecified (R13.10)    Aspiration Risk  Moderate aspiration risk    Diet Recommendation NPO        Other  Recommendations Oral Care Recommendations: Oral care BID   Follow up Recommendations        Frequency and Duration   TBD         Prognosis   TBD     Swallow Study   General Date of Onset: 12/14/18 HPI: Pt adm too [facility] with difficulty breathing.  Pt has h/o bronchiectasis, COPD, Alpha 1 MZ.  He required intubation from 2/5-12/13/2018.  CXR showing increased right lower lobe pna. Swallow evaluation ordered.   Type of Study: Bedside Swallow Evaluation Diet Prior to this Study: NPO Temperature Spikes Noted: No Respiratory Status: Nasal cannula History of Recent Intubation: No Behavior/Cognition: Alert;Cooperative;Pleasant mood Oral Care Completed by SLP: Yes Oral Cavity - Dentition: Adequate natural dentition Vision: Functional for self-feeding Self-Feeding Abilities: Able to feed self Patient Positioning: Upright in bed Baseline Vocal Quality: Low vocal intensity Volitional Cough: Strong(productive) Volitional Swallow: Unable to elicit    Oral/Motor/Sensory Function Overall Oral Motor/Sensory Function: (? slight right facial asymmetry)   Ice Chips Ice chips: Within  functional limits Presentation: Spoon   Thin Liquid Thin Liquid: Not tested    Nectar Thick Nectar Thick Liquid: Impaired Presentation: Cup;Spoon Pharyngeal Phase Impairments: Cough - Delayed;Throat Clearing - Delayed   Honey Thick Honey Thick Liquid: Not tested   Puree  Puree: Impaired Presentation: Self Fed;Spoon Pharyngeal Phase Impairments: Other (comments)(c/o residuals)   Solid     Solid: Not tested      Macario Golds 12/14/2018,1:57 PM   Luanna Salk, MS [facility] SLP Acute Rehab Services Pager [phone] Office [phone]

## 2018-12-14 NOTE — Progress Notes (Signed)
Modified Barium Swallow Progress Note  Patient Details  Name: [name] [name] MRN: [id number] Date of Birth: 21-Nov-1960  Today's Date: 12/14/2018  Modified Barium Swallow completed.  Full report located under Chart Review in the Imaging Section.  Brief recommendations include the following:  Clinical Impression  Patient presents with functional oropharyngeal swallow ability with no aspiration or laryngeal penetration of any consistency tested *thin, nectar, pudding, cracker, and tablet with thin.  Swallow was timely and strong without residuals. Barium tablet oral transit required 2 swallows of liquids but cleared readily into esophagus.  Given pt report of sensation of residuals at pharynx and his RR up to 30s, will follow up x1 to assure po tolerance.  Thanks for allowing me to help with this pt.  Of note, pt had interpreter scheduled for two hours starting at 11 am 12/15/2018. RN informed of testing results and interpreter status.     Swallow Evaluation Recommendations       SLP Diet Recommendations: Regular solids;Thin liquid   Liquid Administration via: Cup;Straw   Medication Administration: (as tolerated)   Supervision: Patient able to self feed   Compensations: Slow rate;Small sips/bites   Postural Changes: Seated upright at 90 degrees;Remain semi-upright after after feeds/meals (Comment)   Oral Care Recommendations: Oral care BID      Luanna Salk, MS [facility] SLP Acute Rehab Services Pager [phone] Office [phone]   Macario Golds 12/14/2018,3:28 PM

## 2018-12-14 NOTE — Care Management Note (Signed)
Case Management Note  Patient Details  Name: [name] [name] MRN: [id number] Date of Birth: [birth year]  Subjective/Objective:                   Inpatient & Surgical Care > Rapid Review Guidelines > Adult > Thoracic Surgery and Pulmonary Disease >Pneumonia RRG (M-282-RRG) Inpatient & Surgical Care > Rapid Review Guidelines > Adult > Thoracic Surgery and Pulmonary Disease >Pneumonia RRG (M-282-RRG)  Pneumonia RRG RRG: M-282-RRG (ISC)  Link to [place] 23rd Edition   Clinical Indications for Admission  Level of Care Criteria  Hospitalization ? Goal Length of Stay  - Ambulatory or 2 days ? Discharge Readiness ? Extended Stay ? Discharge Destination  Footnotes  Codes    Clinical Indications for Admission Return to top of Pneumonia RRG - [place] is appropriate for patient with 1 or more of the following: ? Vital sign abnormality ? Altered mental status ? Tachypnea ? Response to outpatient therapy uncertain ? Inability to tolerate oral fluids or medications ? Signs of dehydration ? Adult with ALL of the following:  Intermediate-risk category patient (eg, Pneumonia Severity Index class III or CURB-65 score 2) PSI CalculatorCURB-65 Calculator  Absence of risk factors for poor outcome that would indicate need for admission (eg, Hypoxemia, gross hemoptysis, cavitary infiltrate, immunocompromise, neuromuscular weakness, cystic fibrosis, tuberculosis) ? Child with 1 or more of the following:  Family not able to provide appropriate observation or supervision  Intermittent apnea  Not feeding  Wheezing ? Other observation care needs. See General Criteria: Observation CareISC guideline.  Admission[A] is indicated for 1 or more of the following: ? Hypoxemia ? Hemodynamic instability ? Altered mental status that is severe or persistent ? Dehydration that is severe or  persistent ? Bacteremia ? Moderate-risk-category or high-risk-category patient (Pneumonia Severity Index class IV or V, or CURB-65 score of 3 or greater) PSI CalculatorCURB-65 Calculator ? Intermediate-risk-category patient (eg, Pneumonia Severity Index class III, or CURB-65 score 2) who does not improve with outpatient and observation care treatmentPSI [place] ? Outpatient treatment failure as indicated by 1 or more of the following:  Failure to respond to antibiotic (eg, resistant organism)  Clinically significant adverse effects from medication (eg, vomiting)  Complications of pneumonia (eg, empyema, bacteremia)  Significant worsening of comorbid conditions necessitating inpatient care (eg, chronic heart failure) ? Appropriate diagnostic testing and treatment unavailable in outpatient or recovery facility (eg, testing or infection control measures unavailable) ? Respiratory findings (eg, Tachypnea) that do not respond to outpatient or observation care treatment ? Complicated pleural effusions (eg, empyema, exudative, loculated) ? Immunocompromised patient (eg, AIDS, chronic steroid use) at moderate or high risk based on clinical evaluation  See PneumoniaISC Optimal Recovery Guideline for further supporting sections.   Level of Care Criteria Return to top of Pneumonia RRG - ISC For patient conditions requiring more than routine inpatient care, documentation of need may be done using the [facility] of Care] chart.             Hospitalization Return to top of Pneumonia RRG - ISC Goal Length of Stay: Ambulatory or 2 days  Note: Goal Length of Stay assumes optimal recovery, decision making, and care. Patients may be discharged to a lower level of care (either later than or sooner than the goal) when it is appropriate for their clinical status and care needs. Discharge Readiness Return to top of Pneumonia RRG - [facility]  Discharge  readiness is indicated by patient  meeting Recovery Milestones, including ALL of the following: ? Hemodynamic stability YES  ? Tachypnea absent  RESP RATRE=31 ? Hypoxemia absent 28%FIO2 ON [place] ? Afebrile, or temperature acceptable for next level of care YES ? Oxygen absent or at baseline need NO SEE ABOVE ? Mental status at baseline YES ? Antibiotic regimen acceptable for next level of care YES ? Ambulatory NO ? Oral hydration, medications, and diet ? NPO, IV SOLU-MEDROL, IV ANCEF ? LEVEL OF CARE-ICU TO SDU   Action/Plan: Will follow for progression of care and clinical status. Will follow for case management needs none present at this time.  Expected Discharge Date:  12/08/18               Expected Discharge Plan:     In-House Referral:     Discharge planning Services     Post Acute Care Choice:    Choice offered to:     DME Arranged:    DME Agency:     HH Arranged:    HH Agency:     Status of Service:     If discussed at [employer], dates discussed:    Additional Comments:  Leeroy Cha, RN 12/14/2018, 10:25 AM

## 2018-12-14 NOTE — Progress Notes (Signed)
[name] [name], MRN:  [id number], DOB:  1961/06/07, LOS: 7 ADMISSION DATE:  12/06/2018, CONSULTATION DATE: 12/06/2018 REFERRING MD: Dr. 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  Admitted with dyspnea, cough, hemoptysis and chest x-ray evidence for R HCAP.  Respiratory culture negative, blood culture MSSA.  He has been  History of present illness   58 year old never smoker with multiple myeloma being treated with Revlimid, Dexamethasone, and Velcade.  Followed in our office by Dr. 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  Also secondary pulmonary hypertension in the setting of all the above.  Managed on Trelegy, oxygen.   He is here with progressive cough, blood-tinged sputum, low-grade fever that began about 3 days prior to admission.  His last myeloma treatment was last Thursday.  Chest x-ray shows possible progression of right lower lobe airspace disease superimposed on his chronic changes.  He has been tachypneic and tachycardic, has an elevated lactate = 4.    Significant Hospital Events     Consults:  ID CCM  Procedures:  ETT 2/5>>> 2/9 R IJ TLC 2/5>>>  Significant Diagnostic Tests:  CT chest 2/2 >>   Micro Data:  Resp 2/2 >> NTD Blood 2/2 >> STAPHYLOCOCCUS AUREUS resistant to erythromycin  Antimicrobials:  Vanco 2/2 >> 2/4 Cefepime 2/2 >>2/4 Ancef 2/4>>> Acyclovir 2/4>>>    Interim history/subjective:  Extubated 2/9 and tolerating, currently 3 L/min Some abdominal discomfort, diarrhea (has been on MiraLAX, tube feeding)  Objective   Blood pressure (!) 152/84, pulse 72,  temperature 98.6 F (37 C), temperature source Oral, resp. rate (!) 26, height 5' (1.524 m), weight 42 kg, SpO2 99 %.    FiO2 (%):  [28 %] 28 %   Intake/Output Summary (Last 24 hours) at 12/14/2018 0915 Last data filed at 12/14/2018 0630 Gross per 24 hour  Intake 966.96 ml  Output 1700 ml  Net -733.04 ml   Filed Weights   12/13/18 0450 12/13/18 0452 12/14/18 0500  Weight: 45.6 kg 45.6 kg 42 kg   Examination: General: Chronically ill appearing male, no distress HENT: Op clear, no stridor Lungs: decreased with rhonchi on L, r mostly clear, no wheeze Cardiovascular: regular, no M Abdomen: soft, mildly tender to palp, no rebound, + BS Extremities: no edema Neuro: awake, alert, moves ext. Does not converse - language barrier? He communicated with me more effectively on day of his admission   Resolved Hospital Problem list     Assessment & Plan:   Acute on chronic hypoxemic respiratory failure  -Push pulmonary hygiene -Wean oxygen as able -Mobilize, PT/OT/out of bed   Right lower lobe healthcare associated pneumonia, presumed MSSA.  History of bronchiectasis, immunosuppression due to chemotherapy.  No current evidence invasive aspergillus, has a history of colonization -Cefazolin, acyclovir as ordered; needs 8 days total abx  Septic shock, resolved; MSSA bacteremia and presumed pneumonia -Norepinephrine weaned to off -Volume resuscitated, 10 L positive currently.  Consider slow gentle diuresis going forward -Echocardiogram without any evidence of endocarditis -Antibiotics as above  Bronchiectasis and COPD (alpha-1 antitrypsin deficient) with acute exacerbation, improved -Change Solu-Medrol to prednisone and plan taper when he is able to take enteral meds -  Follow alpha-1 antitrypsin level as an outpatient -Continue to push pulmonary hygiene -Bronchodilators: Would convert back to Trelegy once daily at discharge.  Continue Pulmicort, change DuoNeb to scheduled.  Stop  [place] -f/u visit established at our office for 12/21/18  Chronic left hydropneumothorax, remote VATS decortication for empyema -No indication that there is an active process in the left pleural space  Multiple myeloma -Revlimid on hold -Acyclovir as ordered    Labs   CBC: Recent Labs  Lab 12/10/18 0309 12/11/18 0330 12/12/18 0400 12/13/18 0500 12/14/18 0506  WBC 3.4* 2.8* 5.0 8.4 7.4  HGB 9.7* 8.6* 8.7* 8.5* 8.5*  HCT 31.1* 26.8* 27.3* 26.6* 27.2*  MCV 79.1* 77.0* 77.8* 78.0* 79.3*  PLT 62* 90* 98* 117* 007    Basic Metabolic Panel: Recent Labs  Lab 12/10/18 0309 12/11/18 0330 12/12/18 0400 12/13/18 0500 12/14/18 0506  NA 144 143 143 147* 148*  K 5.4* 4.4 4.0 3.2* 3.6  CL 118* 114* 111 105 108  CO2 20* 26 29 37* 34*  GLUCOSE 155* 164* 175* 154* 119*  BUN 34* 35* 34* 34* 30*  CREATININE 1.00 0.87 0.66 0.69 0.65  CALCIUM 7.2* 6.6* 6.3* 6.3* 6.5*  MG 2.2 2.1 2.0 1.8 2.0  PHOS 1.3* 1.7* 2.4* 2.1* 2.1*   GFR: Estimated Creatinine Clearance: 61.3 mL/min (by C-G formula based on SCr of 0.65 mg/dL). Recent Labs  Lab 12/08/18 0309 12/09/18 0446  12/11/18 0330 12/12/18 0400 12/13/18 0500 12/14/18 0506  PROCALCITON 2.88 1.59  --   --   --   --   --   WBC 10.8*  --    < > 2.8* 5.0 8.4 7.4   < > = values in this interval not displayed.    Liver Function Tests: Recent Labs  Lab 12/09/18 1245  AST 157*  ALT 74*  ALKPHOS 45  BILITOT 0.5  PROT 5.4*  ALBUMIN 2.3*   No results for input(s): LIPASE, AMYLASE in the last 168 hours. No results for input(s): AMMONIA in the last 168 hours.  ABG    Component Value Date/Time   PHART 7.432 12/12/2018 0345   PCO2ART 44.1 12/12/2018 0345   PO2ART 88.7 12/12/2018 0345   HCO3 29.2 (H) 12/12/2018 0345   TCO2 32 06/24/2016 0902   ACIDBASEDEF 0.4 12/10/2018 0335   O2SAT 96.4 12/12/2018 0345   CBG: Recent Labs  Lab 12/13/18 1500 12/13/18 1938 12/14/18 0003 12/14/18 0503 12/14/18 0754  GLUCAP 100* 124* 135*  103* 113*   Please call if we can assist further.  He has a F/u visit with Tonia Brooms, NP on 12/21/18 at 14:00   Baltazar Apo, MD, PhD 12/14/2018, 9:32 AM [facility] [phone] or if no answer [phone]

## 2018-12-14 NOTE — Progress Notes (Signed)
PT Cancellation Note  Patient Details Name: [name] [name] MRN: [id number] DOB: [birth year]   Cancelled Treatment:    Reason Eval/Treat Not Completed: Patient at procedure or test/unavailable   [facility] 12/14/2018, 2:01 PM

## 2018-12-14 NOTE — Progress Notes (Addendum)
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  Admitted with dyspnea, cough, hemoptysis and chest x-ray evidence for R HCAP.  Respiratory culture negative, blood culture MSSA.  Had to be transferred to the ICU and was intubated.  Subsequently extubated.  Had a brief run of A. fib.  Reason for Visit: MSSA bacteremia.  Healthcare associated pneumonia.  Consultants: Pulmonology.  Infectious disease.  Procedures:  Right internal jugular triple-lumen catheter placed on 2/5 Was intubated from 2/5 to 2/9.  Transthoracic echocardiogram IMPRESSIONS 1. The left ventricle has normal systolic function of 94-76%. The cavity size is normal. There is no increased left ventricular wall thickness. Echo evidence of normal diastolic filling patterns.The left ventricular diastology could not be evaluated due  to nondiagnostic images.  2. The right ventricle is normal in size. There is no increase in right ventricular wall thickness. There is moderately reduced systolic function. Right ventricular systolic pressure is severely elevated with an estimated pressure of 79.4 mmHg.  3. Normal left atrial size.  4. Mildly dilated right atrial size.  5. The aortic valve is tricuspid in structure. There is mild sclerosis of the aortic valve.  6. The anterior mitral valve leaflet is elongated with no obvious prolapse. There is trivial mitral regurgitation.  7. The tricuspid valve is normal in structure. Regurgitation is moderate.  8. Pulmonic valve regurgitation is mild to moderate is mild by color flow Doppler.  9. There is a trivial pericardial effusion anterior to the right ventricle. 10. In comparison to the previous echocardiogram(s): The right ventricular systolic function is  worse.  Antibiotics: Vanco 2/2 >> 2/4 Cefepime 2/2 >>2/4 Ancef 2/4>>> Acyclovir 2/4>>>    Subjective/Interval History: Patient states that he is feeling better.  Still has a cough with yellowish expectoration.  Per nursing staff he has not had any further episodes of atrial fibrillation.  He has been having loose stools however.   Objective:  Vital Signs  Vitals:   12/14/18 0400 12/14/18 0500 12/14/18 0600 12/14/18 0800  BP: (!) 141/90  (!) 152/84   Pulse: 73  72   Resp: (!) 26  (!) 26   Temp: 98 F (36.7 C)   98.6 F (37 C)  TempSrc: Oral   Oral  SpO2: 97%  99%   Weight:  42 kg    Height:        Intake/Output Summary (Last 24 hours) at 12/14/2018 1011 Last data filed at 12/14/2018 5465 Gross per 24 hour  Intake 582.46 ml  Output 1700 ml  Net -1117.54 ml   Filed Weights   12/13/18 0450 12/13/18 0452 12/14/18 0500  Weight: 45.6 kg 45.6 kg 42 kg    General appearance: alert, cooperative, appears stated age and no distress Head: Normocephalic, without obvious abnormality, atraumatic Resp: Normal effort at rest.  Crackles noted right base.  Diminished air entry in the left base with dullness to percussion.  Few rhonchi.  No wheezing. Cardio: regular rate and rhythm, S1, S2 normal, no murmur, click, rub or gallop GI: Abdomen is soft.  Mildly tender in the left lower quadrant without any rebound rigidity or guarding.  No masses organomegaly.  Bowel sounds present. Extremities: extremities normal, atraumatic, no cyanosis or edema Pulses: 2+ and symmetric Neurologic: Awake alert.  No focal neurological deficits.  Lab Results:  Data  Reviewed: I have personally reviewed following labs and imaging studies  CBC: Recent Labs  Lab 12/10/18 0309 12/11/18 0330 12/12/18 0400 12/13/18 0500 12/14/18 0506  WBC 3.4* 2.8* 5.0 8.4 7.4  HGB 9.7* 8.6* 8.7* 8.5* 8.5*  HCT 31.1* 26.8* 27.3* 26.6* 27.2*  MCV 79.1* 77.0* 77.8* 78.0* 79.3*  PLT 62* 90* 98* 117* 177    Basic  Metabolic Panel: Recent Labs  Lab 12/10/18 0309 12/11/18 0330 12/12/18 0400 12/13/18 0500 12/14/18 0506  NA 144 143 143 147* 148*  K 5.4* 4.4 4.0 3.2* 3.6  CL 118* 114* 111 105 108  CO2 20* 26 29 37* 34*  GLUCOSE 155* 164* 175* 154* 119*  BUN 34* 35* 34* 34* 30*  CREATININE 1.00 0.87 0.66 0.69 0.65  CALCIUM 7.2* 6.6* 6.3* 6.3* 6.5*  MG 2.2 2.1 2.0 1.8 2.0  PHOS 1.3* 1.7* 2.4* 2.1* 2.1*    GFR: Estimated Creatinine Clearance: 61.3 mL/min (by C-G formula based on SCr of 0.65 mg/dL).  Liver Function Tests: Recent Labs  Lab 12/09/18 1245  AST 157*  ALT 74*  ALKPHOS 45  BILITOT 0.5  PROT 5.4*  ALBUMIN 2.3*    CBG: Recent Labs  Lab 12/13/18 1500 12/13/18 1938 12/14/18 0003 12/14/18 0503 12/14/18 0754  GLUCAP 100* 124* 135* 103* 113*     Recent Results (from the past 240 hour(s))  Blood culture (routine x 2)     Status: Abnormal   Collection Time: 12/06/18 11:53 AM  Result Value Ref Range Status   Specimen Description   Final    BLOOD FOREARM Performed at [facility], [place] [address] [place], [place] [address]    Special Requests   Final    BOTTLES DRAWN AEROBIC AND ANAEROBIC Blood Culture adequate volume Performed at [facility] [address], [place], [place] [address]    Culture  Setup Time   Final    GRAM POSITIVE COCCI ANAEROBIC BOTTLE ONLY CRITICAL RESULT CALLED TO, READ BACK BY AND VERIFIED WITH: T GREEN PHARMD 1514 12/07/18 A BROWNING Performed at [facility], [place] [address], [place], [place] [address]    Culture STAPHYLOCOCCUS AUREUS (A)  Final   Report Status 12/09/2018 FINAL  Final   Organism ID, Bacteria STAPHYLOCOCCUS AUREUS  Final      Susceptibility   Staphylococcus aureus - MIC*    CIPROFLOXACIN <=0.5 SENSITIVE Sensitive     ERYTHROMYCIN >=8 RESISTANT Resistant     GENTAMICIN <=0.5 SENSITIVE Sensitive     OXACILLIN 0.5 SENSITIVE Sensitive     TETRACYCLINE <=1 SENSITIVE Sensitive     VANCOMYCIN <=0.5  SENSITIVE Sensitive     TRIMETH/SULFA <=10 SENSITIVE Sensitive     CLINDAMYCIN <=0.25 SENSITIVE Sensitive     RIFAMPIN <=0.5 SENSITIVE Sensitive     Inducible Clindamycin NEGATIVE Sensitive     * STAPHYLOCOCCUS AUREUS  Blood culture (routine x 2)     Status: None   Collection Time: 12/06/18 11:53 AM  Result Value Ref Range Status   Specimen Description   Final    BLOOD RIGHT FOREARM Performed at [facility], [place] [address] [place], [place] [address]    Special Requests   Final    BOTTLES DRAWN AEROBIC AND ANAEROBIC Blood Culture adequate volume Performed at [facility] [address], [place], [place] [address]    Culture   Final    NO GROWTH 5 DAYS Performed at [facility], [place],  [place] [address]    Report Status 12/11/2018 FINAL  Final  Blood Culture ID Panel (Reflexed)     Status: Abnormal   Collection Time: 12/06/18 11:53 AM  Result Value Ref Range Status   Enterococcus species NOT DETECTED NOT DETECTED Final   Listeria monocytogenes NOT DETECTED NOT DETECTED Final   Staphylococcus species DETECTED (A) NOT DETECTED Final    Comment: CRITICAL RESULT CALLED TO, READ BACK BY AND VERIFIED WITH: T GREEN PHARMD 1514 12/07/18 A BROWNING    Staphylococcus aureus (BCID) DETECTED (A) NOT DETECTED Final    Comment: Methicillin (oxacillin) susceptible Staphylococcus aureus (MSSA). Preferred therapy is anti staphylococcal beta lactam antibiotic (Cefazolin or Nafcillin), unless clinically contraindicated. CRITICAL RESULT CALLED TO, READ BACK BY AND VERIFIED WITH: T GREEN PHARMD 1514 12/07/18 A BROWNING    Methicillin resistance NOT DETECTED NOT DETECTED Final   Streptococcus species NOT DETECTED NOT DETECTED Final   Streptococcus agalactiae NOT DETECTED NOT DETECTED Final   Streptococcus pneumoniae NOT DETECTED NOT DETECTED Final   Streptococcus pyogenes NOT DETECTED NOT DETECTED Final   Acinetobacter baumannii NOT DETECTED NOT  DETECTED Final   Enterobacteriaceae species NOT DETECTED NOT DETECTED Final   Enterobacter cloacae complex NOT DETECTED NOT DETECTED Final   Escherichia coli NOT DETECTED NOT DETECTED Final   Klebsiella oxytoca NOT DETECTED NOT DETECTED Final   Klebsiella pneumoniae NOT DETECTED NOT DETECTED Final   Proteus species NOT DETECTED NOT DETECTED Final   Serratia marcescens NOT DETECTED NOT DETECTED Final   Haemophilus influenzae NOT DETECTED NOT DETECTED Final   Neisseria meningitidis NOT DETECTED NOT DETECTED Final   Pseudomonas aeruginosa NOT DETECTED NOT DETECTED Final   Candida albicans NOT DETECTED NOT DETECTED Final   Candida glabrata NOT DETECTED NOT DETECTED Final   Candida krusei NOT DETECTED NOT DETECTED Final   Candida parapsilosis NOT DETECTED NOT DETECTED Final   Candida tropicalis NOT DETECTED NOT DETECTED Final    Comment: Performed at [facility], [place]. [address] [place], [place] [address]  MRSA PCR Screening     Status: None   Collection Time: 12/06/18  5:41 PM  Result Value Ref Range Status   MRSA by PCR NEGATIVE NEGATIVE Final    Comment:        The GeneXpert MRSA Assay (FDA approved for NASAL specimens only), is one component of a comprehensive MRSA colonization surveillance program. It is not intended to diagnose MRSA infection nor to guide or monitor treatment for MRSA infections. Performed at [facility], [place] [address] [place], [place] [address]   Culture, blood (routine x 2)     Status: None   Collection Time: 12/07/18  6:34 PM  Result Value Ref Range Status   Specimen Description   Final    BLOOD RIGHT HAND Performed at [facility] [address], [place], [place] [address]    Special Requests   Final    BOTTLES DRAWN AEROBIC ONLY Blood Culture results may not be optimal due to an inadequate volume of blood received in culture bottles Performed at [facility] [address],  [place], [place] [address]    Culture   Final    NO GROWTH 5 DAYS Performed at [facility], [place] [address], [place], [place] [address]    Report Status 12/12/2018 FINAL  Final  Culture, blood (routine x 2)     Status: None   Collection Time: 12/07/18  6:35 PM  Result Value Ref Range Status  Specimen Description   Final    BLOOD RIGHT ARM Performed at [facility] [address] [place], [place] [address]    Special Requests   Final    BOTTLES DRAWN AEROBIC ONLY Blood Culture results may not be optimal due to an inadequate volume of blood received in culture bottles Performed at [facility] [address] [place], [place] [address]    Culture   Final    NO GROWTH 5 DAYS Performed at [facility], [place] [address], [place], [place] [address]    Report Status 12/12/2018 FINAL  Final  Culture, respiratory     Status: None   Collection Time: 12/10/18 11:20 AM  Result Value Ref Range Status   Specimen Description   Final    TRACHEAL ASPIRATE Performed at [facility] [address], [place], [place] [address]    Special Requests   Final    NONE Performed at [facility], [place] [address], [place], [place] [address]    Gram Stain   Final    RARE WBC PRESENT, PREDOMINANTLY PMN FEW GRAM POSITIVE COCCI IN CLUSTERS Performed at [facility], [place] [address], [place], [place] [address]    Culture RARE STAPHYLOCOCCUS AUREUS  Final   Report Status 12/12/2018 FINAL  Final   Organism ID, Bacteria STAPHYLOCOCCUS AUREUS  Final      Susceptibility   Staphylococcus aureus - MIC*    CIPROFLOXACIN <=0.5 SENSITIVE Sensitive     ERYTHROMYCIN >=8 RESISTANT Resistant     GENTAMICIN <=0.5 SENSITIVE Sensitive     OXACILLIN 0.5 SENSITIVE Sensitive     TETRACYCLINE <=1 SENSITIVE Sensitive     VANCOMYCIN <=0.5 SENSITIVE Sensitive     TRIMETH/SULFA <=10 SENSITIVE Sensitive     CLINDAMYCIN <=0.25 SENSITIVE  Sensitive     RIFAMPIN <=0.5 SENSITIVE Sensitive     Inducible Clindamycin NEGATIVE Sensitive     * RARE STAPHYLOCOCCUS AUREUS      Radiology Studies: Dg Chest Port 1 View  Result Date: 12/13/2018 CLINICAL DATA:  Respiratory failure. EXAM: PORTABLE CHEST 1 VIEW COMPARISON:  12/12/2018 FINDINGS: The endotracheal tube is 3 cm above the carina. The NG tube is coursing down the esophagus and into the stomach. The right IJ central venous catheter tip is in the distal SVC. Chronic underlying lung changes with evidence of remote left-sided surgery and dense right-sided pleural calcification. Suspect a small left basilar pneumothorax. Overall improved aeration with probable resolving overlying pulmonary edema. IMPRESSION: 1. Support apparatus in good position without complicating features. 2. Suspect new small loculated left basilar pneumothorax. A right-side-down left side up decubitus film may be helpful for further evaluation. 3. Chronic underlying changes with evidence of improved aeration since yesterday's film likely representing resolving pulmonary edema. Electronically Signed   By: Marijo Sanes M.D.   On: 12/13/2018 05:00     Medications:  Scheduled: . acyclovir  400 mg Oral Daily  . aspirin  81 mg Per Tube Daily  . budesonide (PULMICORT) nebulizer solution  0.5 mg Nebulization BID  . chlorhexidine  15 mL Mouth Rinse BID  . Chlorhexidine Gluconate Cloth  6 each Topical Daily  . docusate  50 mg Per Tube QHS  . enoxaparin (LOVENOX) injection  30 mg Subcutaneous Q24H  . insulin aspart  2-6 Units Subcutaneous Q4H  . ipratropium-albuterol  3 mL Nebulization QID  . mouth rinse  15 mL Mouth Rinse q12n4p  . methylPREDNISolone (SOLU-MEDROL) injection  40  mg Intravenous Q8H  . multivitamin  15 mL Per Tube Daily  . pantoprazole sodium  40 mg Per Tube Daily   Continuous: . sodium chloride 10 mL/hr at 12/13/18 1800  .  ceFAZolin (ANCEF) IV Stopped (12/14/18 7014)  . dexmedetomidine (PRECEDEX) IV  infusion Stopped (12/13/18 0959)  . feeding supplement (VITAL AF 1.2 CAL) Stopped (12/13/18 1000)   DCV:UDTHYH chloride, acetaminophen (TYLENOL) oral liquid 160 mg/5 mL **OR** acetaminophen, bisacodyl, guaiFENesin, labetalol, ondansetron **OR** ondansetron (ZOFRAN) IV, oxyCODONE-acetaminophen, sennosides, simethicone, sodium chloride flush    Assessment/Plan:  Acute on chronic respiratory failure with hypoxia This is most likely due to the pneumonia.  Patient had to be intubated.  He was successfully extubated on 2/9.  Seems to be stable from a respiratory standpoint.  Pulmonology is following.  Continue oxygen.  Wean down as tolerated.  Healthcare associated pneumonia involving the right lower lobe, presumed MSSA Patient has a history of bronchiectasis.  He has been immunosuppressed due to chemotherapy.  No evidence for invasive aspergillosis per pulmonology.  He does however have a history of colonization.  Patient currently on cefazolin and acyclovir.  ID is following.  MSSA bacteremia/septic shock He has been off of pressors.  Vital signs are stable.  Sepsis physiology has resolved.  Echocardiogram did not show any endocarditis.  Antibiotics as per infectious disease.  Paroxysmal atrial fibrillation Patient had a brief run of on 2/8.  He converted back to sinus rhythm spontaneously.  Echocardiogram report as above.  Will check TSH.  This likely occurred in the setting of sepsis and infection.  If he does not have any further episodes there is no need to initiate anticoagulation.  His chads 2 vascular score appears to be 0.  Abdominal pain with loose stools Hold laxatives.  Abdominal films will be ordered.  Monitor for now.  History of bronchiectasis and COPD He has a history of alpha-1 antitrypsin deficiency.  He is followed by pulmonology in the outpatient setting.  He is on steroids currently.  Currently on Pulmicort and nebulizer treatments.  Chronic left hydropneumothorax He has  had VATS procedure remotely for empyema.  Per pulmonology there is no indication of any active process in that area.  History of multiple myeloma Revlimid is on hold.  Acyclovir has been continued.  Followed by Dr. Irene Limbo.  Mild hypernatremia Monitor periodically.  Microcytic anemia Platelet count stable.  No evidence of overt bleeding.  DVT Prophylaxis: Lovenox Code Status: Full code Family Communication: Discussed with the patient.  No family at bedside Disposition Plan: Management as outlined above.  Speech therapy consulted for swallow evaluation.    LOS: 7 days   Chalene Treu [employer] on [email or web address]  12/14/2018, 10:11 AM

## 2018-12-15 LAB — CBC
HCT: 27.1 % — ABNORMAL LOW (ref 39.0–52.0)
Hemoglobin: 8.4 g/dL — ABNORMAL LOW (ref 13.0–17.0)
MCH: 24.8 pg — ABNORMAL LOW (ref 26.0–34.0)
MCHC: 31 g/dL (ref 30.0–36.0)
MCV: 79.9 fL — ABNORMAL LOW (ref 80.0–100.0)
Platelets: 214 10*3/uL (ref 150–400)
RBC: 3.39 MIL/uL — ABNORMAL LOW (ref 4.22–5.81)
RDW: 16.5 % — ABNORMAL HIGH (ref 11.5–15.5)
WBC: 5.9 10*3/uL (ref 4.0–10.5)
nRBC: 0 % (ref 0.0–0.2)

## 2018-12-15 LAB — BASIC METABOLIC PANEL
Anion gap: 6 (ref 5–15)
BUN: 23 mg/dL — ABNORMAL HIGH (ref 6–20)
CO2: 31 mmol/L (ref 22–32)
Calcium: 6.5 mg/dL — ABNORMAL LOW (ref 8.9–10.3)
Chloride: 104 mmol/L (ref 98–111)
Creatinine, Ser: 0.59 mg/dL — ABNORMAL LOW (ref 0.61–1.24)
GFR calc non Af Amer: >60 mL/min (ref >60)
Glucose, Bld: 132 mg/dL — ABNORMAL HIGH (ref 70–99)
Potassium: 3.5 mmol/L (ref 3.5–5.1)
Sodium: 141 mmol/L (ref 135–145)

## 2018-12-15 LAB — GLUCOSE, CAPILLARY
Glucose-Capillary: 131 mg/dL — ABNORMAL HIGH (ref 70–99)
Glucose-Capillary: 129 mg/dL — ABNORMAL HIGH (ref 70–99)
Glucose-Capillary: 161 mg/dL — ABNORMAL HIGH (ref 70–99)
Glucose-Capillary: 108 mg/dL — ABNORMAL HIGH (ref 70–99)
Glucose-Capillary: 131 mg/dL — ABNORMAL HIGH (ref 70–99)

## 2018-12-15 LAB — MISC LABCORP TEST (SEND OUT): Labcorp test code: 183805

## 2018-12-15 LAB — FUNGITELL, SERUM: Fungitell Result: 131 pg/mL — ABNORMAL HIGH (ref <80)

## 2018-12-15 LAB — ASPERGILLUS ANTIGEN, BAL/SERUM: Aspergillus Ag, BAL/Serum: 1.75 Index — ABNORMAL HIGH (ref 0.00–0.49)

## 2018-12-15 LAB — T4, FREE: Free T4: 1.16 ng/dL (ref 0.82–1.77)

## 2018-12-15 LAB — TSH: TSH: 0.209 u[IU]/mL — AB (ref 0.350–4.500)

## 2018-12-15 MED ORDER — PREDNISONE 50 MG PO TABS
60.0000 mg | ORAL_TABLET | Freq: Two times a day (BID) | ORAL | Status: DC
  Administered 2018-12-15 – 2018-12-19 (×8): 60 mg via ORAL
  Filled 2018-12-15 (×8): qty 1

## 2018-12-15 MED ORDER — HYDRALAZINE HCL 20 MG/ML IJ SOLN
5.0000 mg | Freq: Once | INTRAMUSCULAR | Status: AC
  Administered 2018-12-15: 5 mg via INTRAVENOUS
  Filled 2018-12-15: qty 1

## 2018-12-15 MED ORDER — HYDRALAZINE HCL 20 MG/ML IJ SOLN: 10.0000 mg | Freq: Four times a day (QID) | INTRAMUSCULAR | Status: DC | PRN

## 2018-12-15 MED ORDER — INSULIN ASPART 100 UNIT/ML ~~LOC~~ SOLN
0.0000 [IU] | Freq: Three times a day (TID) | SUBCUTANEOUS | Status: DC
  Administered 2018-12-15 – 2018-12-16 (×2): 2 [IU] via SUBCUTANEOUS
  Administered 2018-12-17: 1 [IU] via SUBCUTANEOUS
  Administered 2018-12-17: 2 [IU] via SUBCUTANEOUS
  Administered 2018-12-18: 1 [IU] via SUBCUTANEOUS
  Administered 2018-12-18: 3 [IU] via SUBCUTANEOUS
  Administered 2018-12-18 – 2018-12-19 (×2): 1 [IU] via SUBCUTANEOUS

## 2018-12-15 NOTE — Progress Notes (Addendum)
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  Admitted with dyspnea, cough, hemoptysis and chest x-ray evidence for R HCAP.  Respiratory culture negative, blood culture MSSA.  Had to be transferred to the ICU and was intubated.  Subsequently extubated.  Had a brief run of A. fib.  Reason for Visit: MSSA bacteremia.  Healthcare associated pneumonia.  Consultants: Pulmonology.  Infectious disease.  Procedures:  Right internal jugular triple-lumen catheter placed on 2/5 Was intubated from 2/5 to 2/9.  Transthoracic echocardiogram IMPRESSIONS 1. The left ventricle has normal systolic function of 44-01%. The cavity size is normal. There is no increased left ventricular wall thickness. Echo evidence of normal diastolic filling patterns.The left ventricular diastology could not be evaluated due  to nondiagnostic images.  2. The right ventricle is normal in size. There is no increase in right ventricular wall thickness. There is moderately reduced systolic function. Right ventricular systolic pressure is severely elevated with an estimated pressure of 79.4 mmHg.  3. Normal left atrial size.  4. Mildly dilated right atrial size.  5. The aortic valve is tricuspid in structure. There is mild sclerosis of the aortic valve.  6. The anterior mitral valve leaflet is elongated with no obvious prolapse. There is trivial mitral regurgitation.  7. The tricuspid valve is normal in structure. Regurgitation is moderate.  8. Pulmonic valve regurgitation is mild to moderate is mild by color flow Doppler.  9. There is a trivial pericardial effusion anterior to the right ventricle. 10. In comparison to the previous echocardiogram(s): The right ventricular systolic function is  worse.  Antibiotics: Vanco 2/2 >> 2/4 Cefepime 2/2 >>2/4 Ancef 2/4>>> Acyclovir 2/4>>>    Subjective/Interval History: Patient states that he is feeling better.  Cough is improving.  No chest pain.  Does complain of mild left-sided abdominal pain.     Objective:  Vital Signs  Vitals:   12/15/18 0700 12/15/18 0800 12/15/18 0900 12/15/18 1000  BP:  (!) 184/97  (!) 146/85  Pulse:  74 85 93  Resp:  (!) 28 (!) 31 (!) 31  Temp: 98.2 F (36.8 C)     TempSrc: Oral     SpO2:  100% 96% 95%  Weight:      Height:        Intake/Output Summary (Last 24 hours) at 12/15/2018 1038 Last data filed at 12/15/2018 1000 Gross per 24 hour  Intake 893.35 ml  Output 1350 ml  Net -456.65 ml   Filed Weights   12/13/18 0452 12/14/18 0500 12/15/18 0500  Weight: 45.6 kg 42 kg 41.2 kg   General appearance: Awake alert.  In no distress Resp: Clear to auscultation bilaterally.  Normal effort Cardio: S1-S2 is normal regular.  No S3-S4.  No rubs murmurs or bruit GI: Abdomen is soft.  Mildly tender in the left lower quadrant without any rebound rigidity or guarding.  No masses organomegaly.  Bowel sounds are present. Extremities: No edema.  Full range of motion of lower extremities. Neurologic: Alert and oriented x3.  No focal neurological deficits.    Lab Results:  Data Reviewed: I have personally reviewed following labs and imaging studies  CBC: Recent Labs  Lab 12/11/18 0330 12/12/18 0400 12/13/18 0500 12/14/18 0506 12/15/18 0515  WBC 2.8* 5.0 8.4 7.4 5.9  HGB 8.6* 8.7* 8.5* 8.5* 8.4*  HCT 26.8* 27.3* 26.6* 27.2* 27.1*  MCV 77.0* 77.8* 78.0* 79.3* 79.9*  PLT 90* 98* 117* 177 354    Basic Metabolic Panel: Recent Labs  Lab 12/10/18 0309 12/11/18 0330 12/12/18 0400 12/13/18 0500 12/14/18 0506 12/15/18 0515  NA 144 143 143 147* 148* 141  K 5.4* 4.4 4.0 3.2* 3.6 3.5  CL 118* 114* 111 105 108 104  CO2 20* 26 29 37* 34* 31  GLUCOSE 155* 164* 175* 154* 119* 132*  BUN 34* 35* 34*  34* 30* 23*  CREATININE 1.00 0.87 0.66 0.69 0.65 0.59*  CALCIUM 7.2* 6.6* 6.3* 6.3* 6.5* 6.5*  MG 2.2 2.1 2.0 1.8 2.0  --   PHOS 1.3* 1.7* 2.4* 2.1* 2.1*  --     GFR: Estimated Creatinine Clearance: 60.1 mL/min (A) (by C-G formula based on SCr of 0.59 mg/dL (L)).  Liver Function Tests: Recent Labs  Lab 12/09/18 1245  AST 157*  ALT 74*  ALKPHOS 45  BILITOT 0.5  PROT 5.4*  ALBUMIN 2.3*    CBG: Recent Labs  Lab 12/14/18 1611 12/14/18 2020 12/14/18 2255 12/15/18 0354 12/15/18 0744  GLUCAP 155* 155* 94 131* 129*     Recent Results (from the past 240 hour(s))  Blood culture (routine x 2)     Status: Abnormal   Collection Time: 12/06/18 11:53 AM  Result Value Ref Range Status   Specimen Description   Final    BLOOD FOREARM Performed at [facility], [place] [address] [place], [place] [address]    Special Requests   Final    BOTTLES DRAWN AEROBIC AND ANAEROBIC Blood Culture adequate volume Performed at [facility] [address] [place], [place] [address]    Culture  Setup Time   Final    GRAM POSITIVE COCCI ANAEROBIC BOTTLE ONLY CRITICAL RESULT CALLED TO, READ BACK BY AND VERIFIED WITH: T GREEN PHARMD 1514 12/07/18 A BROWNING Performed at [facility], [place] [address], [place], [place] [address]    Culture STAPHYLOCOCCUS AUREUS (A)  Final   Report Status 12/09/2018 FINAL  Final   Organism ID, Bacteria STAPHYLOCOCCUS AUREUS  Final      Susceptibility   Staphylococcus aureus - MIC*    CIPROFLOXACIN <=0.5 SENSITIVE Sensitive     ERYTHROMYCIN >=8 RESISTANT Resistant     GENTAMICIN <=0.5 SENSITIVE Sensitive     OXACILLIN 0.5 SENSITIVE Sensitive     TETRACYCLINE <=1 SENSITIVE Sensitive     VANCOMYCIN <=0.5 SENSITIVE Sensitive     TRIMETH/SULFA <=10 SENSITIVE Sensitive     CLINDAMYCIN <=0.25 SENSITIVE Sensitive     RIFAMPIN <=0.5 SENSITIVE Sensitive     Inducible Clindamycin NEGATIVE Sensitive     * STAPHYLOCOCCUS AUREUS  Blood  culture (routine x 2)     Status: None   Collection Time: 12/06/18 11:53 AM  Result Value Ref Range Status   Specimen Description   Final    BLOOD RIGHT FOREARM Performed at [facility], [place] [address] [place], [place] [address]    Special Requests   Final    BOTTLES DRAWN AEROBIC AND ANAEROBIC Blood Culture adequate volume Performed at [facility] [address] [place], [place] [address]    Culture   Final    NO GROWTH 5 DAYS Performed at [facility], [place] [address], [place], [place] [address]    Report Status 12/11/2018 FINAL  Final  Blood Culture ID Panel (Reflexed)     Status: Abnormal  Collection Time: 12/06/18 11:53 AM  Result Value Ref Range Status   Enterococcus species NOT DETECTED NOT DETECTED Final   Listeria monocytogenes NOT DETECTED NOT DETECTED Final   Staphylococcus species DETECTED (A) NOT DETECTED Final    Comment: CRITICAL RESULT CALLED TO, READ BACK BY AND VERIFIED WITH: T GREEN PHARMD 1514 12/07/18 A BROWNING    Staphylococcus aureus (BCID) DETECTED (A) NOT DETECTED Final    Comment: Methicillin (oxacillin) susceptible Staphylococcus aureus (MSSA). Preferred therapy is anti staphylococcal beta lactam antibiotic (Cefazolin or Nafcillin), unless clinically contraindicated. CRITICAL RESULT CALLED TO, READ BACK BY AND VERIFIED WITH: T GREEN PHARMD 1514 12/07/18 A BROWNING    Methicillin resistance NOT DETECTED NOT DETECTED Final   Streptococcus species NOT DETECTED NOT DETECTED Final   Streptococcus agalactiae NOT DETECTED NOT DETECTED Final   Streptococcus pneumoniae NOT DETECTED NOT DETECTED Final   Streptococcus pyogenes NOT DETECTED NOT DETECTED Final   Acinetobacter baumannii NOT DETECTED NOT DETECTED Final   Enterobacteriaceae species NOT DETECTED NOT DETECTED Final   Enterobacter cloacae complex NOT DETECTED NOT DETECTED Final   Escherichia coli NOT DETECTED NOT DETECTED Final   Klebsiella oxytoca NOT DETECTED NOT  DETECTED Final   Klebsiella pneumoniae NOT DETECTED NOT DETECTED Final   Proteus species NOT DETECTED NOT DETECTED Final   Serratia marcescens NOT DETECTED NOT DETECTED Final   Haemophilus influenzae NOT DETECTED NOT DETECTED Final   Neisseria meningitidis NOT DETECTED NOT DETECTED Final   Pseudomonas aeruginosa NOT DETECTED NOT DETECTED Final   Candida albicans NOT DETECTED NOT DETECTED Final   Candida glabrata NOT DETECTED NOT DETECTED Final   Candida krusei NOT DETECTED NOT DETECTED Final   Candida parapsilosis NOT DETECTED NOT DETECTED Final   Candida tropicalis NOT DETECTED NOT DETECTED Final    Comment: Performed at [facility], [place]. [address], [place], [place] [address]  MRSA PCR Screening     Status: None   Collection Time: 12/06/18  5:41 PM  Result Value Ref Range Status   MRSA by PCR NEGATIVE NEGATIVE Final    Comment:        The GeneXpert MRSA Assay (FDA approved for NASAL specimens only), is one component of a comprehensive MRSA colonization surveillance program. It is not intended to diagnose MRSA infection nor to guide or monitor treatment for MRSA infections. Performed at [facility], [place] [address], [place], [place] [address]   Culture, blood (routine x 2)     Status: None   Collection Time: 12/07/18  6:34 PM  Result Value Ref Range Status   Specimen Description   Final    BLOOD RIGHT HAND Performed at [facility] [address] [place], [place] [address]    Special Requests   Final    BOTTLES DRAWN AEROBIC ONLY Blood Culture results may not be optimal due to an inadequate volume of blood received in culture bottles Performed at [facility] [address], [place], [place] [address]    Culture   Final    NO GROWTH 5 DAYS Performed at [facility], [place] [address], [place], [place] [address]    Report Status 12/12/2018 FINAL  Final  Culture, blood (routine x 2)     Status: None    Collection Time: 12/07/18  6:35 PM  Result Value Ref Range Status   Specimen Description   Final    BLOOD RIGHT ARM Performed at [place] [address] [place], [place]  [address]    Special Requests   Final    BOTTLES DRAWN AEROBIC ONLY Blood Culture results may not be optimal due to an inadequate volume of blood received in culture bottles Performed at [facility], [place] [address], [place], [place] [address]    Culture   Final    NO GROWTH 5 DAYS Performed at [facility], [place] [address], [place], [place] [address]    Report Status 12/12/2018 FINAL  Final  Aspergillus Ag, BAL/Serum     Status: Abnormal   Collection Time: 12/09/18  5:12 PM  Result Value Ref Range Status   Aspergillus Ag, BAL/Serum 1.75 (H) 0.00 - 0.49 Index Final    Comment: (NOTE) Performed At: [facility] [address] [place], [place] [id number] Rush Farmer MD YY:[id number] Performed At: [facility] [address] [place], [place] [id number] Nechama Guard MD [id number]   Culture, respiratory     Status: None   Collection Time: 12/10/18 11:20 AM  Result Value Ref Range Status   Specimen Description   Final    TRACHEAL ASPIRATE Performed at [facility] [address] [place], [place] [address]    Special Requests   Final    NONE Performed at [facility], [place] [address], [place], [place] [address]    Gram Stain   Final    RARE WBC PRESENT, PREDOMINANTLY PMN FEW GRAM POSITIVE COCCI IN CLUSTERS Performed at [facility], [place] [address], [place], [place] [address]    Culture RARE STAPHYLOCOCCUS AUREUS  Final   Report Status 12/12/2018 FINAL  Final   Organism ID, Bacteria STAPHYLOCOCCUS AUREUS  Final      Susceptibility   Staphylococcus aureus - MIC*    CIPROFLOXACIN <=0.5 SENSITIVE Sensitive     ERYTHROMYCIN >=8 RESISTANT Resistant     GENTAMICIN <=0.5 SENSITIVE Sensitive      OXACILLIN 0.5 SENSITIVE Sensitive     TETRACYCLINE <=1 SENSITIVE Sensitive     VANCOMYCIN <=0.5 SENSITIVE Sensitive     TRIMETH/SULFA <=10 SENSITIVE Sensitive     CLINDAMYCIN <=0.25 SENSITIVE Sensitive     RIFAMPIN <=0.5 SENSITIVE Sensitive     Inducible Clindamycin NEGATIVE Sensitive     * RARE STAPHYLOCOCCUS AUREUS      Radiology Studies: Dg Abd 2 Views  Result Date: 12/14/2018 CLINICAL DATA:  Abdominal pain. EXAM: ABDOMEN - 2 VIEW COMPARISON:  Chest x-ray December 13, 2018 FINDINGS: Suspected small loculated left basilar pneumothorax, unchanged since yesterday's chest x-ray. Opacity in the right base remains. The bowel gas pattern is nonobstructive. The abdomen is otherwise unremarkable. A left side down decubitus film demonstrates no free air. Calcification is seen in the right basilar pleura or parenchyma. No other acute abnormalities. IMPRESSION: No acute abnormality in the abdomen. A right basilar infiltrate. Suspected loculated pneumothorax in the left base, described on yesterday's chest x-ray. Electronically Signed   By: Dorise Bullion III M.D   On: 12/14/2018 14:34   Dg Swallowing Func-speech Pathology  Result Date: 12/14/2018 Objective Swallowing Evaluation: Type of Study: MBS-Modified Barium Swallow Study  Patient Details Name: Travanti Mcmanus MRN: [id number] Date of Birth: [birth year] Today's Date: 12/14/2018 Time: SLP Start Time (ACUTE ONLY): 1430 -SLP Stop Time (ACUTE ONLY): 1455 SLP Time Calculation (min) (ACUTE ONLY): 25 min Past Medical History: Past Medical History: Diagnosis Date . Acute on chronic respiratory failure ([place]) 02/07/2018 . Hypertension  . Kidney stones  . Multiple myeloma ([place])  . Obstructive lung disease (  generalized) ([place])  . Pulmonary hypertension ([place])  Past Surgical History: Past Surgical History: Procedure Laterality Date . CARDIAC CATHETERIZATION N/A 01/18/2016  Procedure: Right Heart Cath;  Surgeon: Jolaine Artist, MD;  Location: [facility];  Service:  Cardiovascular;  Laterality: N/A; . CARDIAC CATHETERIZATION N/A 06/24/2016  Procedure: Right Heart Cath;  Surgeon: Jolaine Artist, MD;  Location: [facility];  Service: Cardiovascular;  Laterality: N/A; . IR RADIOLOGIST EVAL & MGMT  05/27/2018 . IR RADIOLOGIST EVAL & MGMT  09/01/2018 . KIDNEY SURGERY   . VIDEO ASSISTED THORACOSCOPY (VATS)/DECORTICATION Left 2003 . VIDEO ASSISTED THORACOSCOPY (VATS)/DECORTICATION Left 10/02/2015  Procedure: VIDEO ASSISTED THORACOSCOPY (VATS)/DECORTICATION and drainage of chronic empyena;  Surgeon: [name], MD;  Location: [facility];  Service: Thoracic;  Laterality: Left; Marland Kitchen VIDEO BRONCHOSCOPY N/A 10/02/2015  Procedure: VIDEO BRONCHOSCOPY;  Surgeon: [name], MD;  Location: [facility];  Service: Thoracic;  Laterality: N/A; HPI: Pt adm too [facility] with difficulty breathing.  Pt has h/o bronchiectasis, COPD, Alpha 1 MZ.  He required intubation from 2/5-12/13/2018.  CXR showing increased right lower lobe pna. Swallow evaluation ordered.   Subjective: pt awake in chair Assessment / Plan / Recommendation CHL IP CLINICAL IMPRESSIONS 12/14/2018 Clinical Impression Patient presents with functional oropharyngeal swallow ability with no aspiration or laryngeal penetration of any consistency tested *thin, nectar, pudding, cracker, and tablet with thin.  Swallow was timely and strong without residuals. Barium tablet oral transit required 2 swallows of liquids but cleared readily into esophagus.  Given pt report of sensation of residuals at pharynx and his RR up to 30s, will follow up x1 to assure po tolerance.  Thanks for allowing me to help with this pt.  Of note, pt had interpreter scheduled for two hours starting at 11 am 12/15/2018. RN informed of testing results and interpreter status.   SLP Visit Diagnosis Dysphagia, unspecified (R13.10) Attention and concentration deficit following -- Frontal lobe and executive function deficit following -- Impact on safety and function Mild  aspiration risk   CHL IP TREATMENT RECOMMENDATION 12/14/2018 Treatment Recommendations Therapy as outlined in treatment plan below   Prognosis 12/14/2018 Prognosis for Safe Diet Advancement Good Barriers to Reach Goals -- Barriers/Prognosis Comment -- CHL IP DIET RECOMMENDATION 12/14/2018 SLP Diet Recommendations Regular solids;Thin liquid Liquid Administration via Cup;Straw Medication Administration (No Data) Compensations Slow rate;Small sips/bites Postural Changes Seated upright at 90 degrees;Remain semi-upright after after feeds/meals (Comment)   CHL IP OTHER RECOMMENDATIONS 12/14/2018 Recommended Consults -- Oral Care Recommendations Oral care BID Other Recommendations --   No flowsheet data found.  CHL IP FREQUENCY AND DURATION 12/14/2018 Speech Therapy Frequency (ACUTE ONLY) min 1 x/week Treatment Duration 1 week      CHL IP ORAL PHASE 12/14/2018 Oral Phase WFL Oral - Pudding Teaspoon -- Oral - Pudding Cup -- Oral - Honey Teaspoon -- Oral - Honey Cup -- Oral - Nectar Teaspoon -- Oral - Nectar Cup -- Oral - Nectar Straw -- Oral - Thin Teaspoon -- Oral - Thin Cup -- Oral - Thin Straw -- Oral - Puree -- Oral - Mech Soft -- Oral - Regular -- Oral - Multi-Consistency -- Oral - Pill -- Oral Phase - Comment --  CHL IP PHARYNGEAL PHASE 12/14/2018 Pharyngeal Phase WFL Pharyngeal- Pudding Teaspoon -- Pharyngeal -- Pharyngeal- Pudding Cup -- Pharyngeal -- Pharyngeal- Honey Teaspoon -- Pharyngeal -- Pharyngeal- Honey Cup -- Pharyngeal -- Pharyngeal- Nectar Teaspoon -- Pharyngeal -- Pharyngeal- Nectar Cup -- Pharyngeal -- Pharyngeal- Nectar Straw --  Pharyngeal -- Pharyngeal- Thin Teaspoon -- Pharyngeal -- Pharyngeal- Thin Cup -- Pharyngeal -- Pharyngeal- Thin Straw -- Pharyngeal -- Pharyngeal- Puree -- Pharyngeal -- Pharyngeal- Mechanical Soft -- Pharyngeal -- Pharyngeal- Regular -- Pharyngeal -- Pharyngeal- Multi-consistency -- Pharyngeal -- Pharyngeal- Pill -- Pharyngeal -- Pharyngeal Comment --  CHL IP CERVICAL ESOPHAGEAL  PHASE 12/14/2018 Cervical Esophageal Phase WFL Pudding Teaspoon -- Pudding Cup -- Honey Teaspoon -- Honey Cup -- Nectar Teaspoon -- Nectar Cup -- Nectar Straw -- Thin Teaspoon -- Thin Cup -- Thin Straw -- Puree -- Mechanical Soft -- Regular -- Multi-consistency -- Pill -- Cervical Esophageal Comment -- Macario Golds 12/14/2018, 3:28 PM  Luanna Salk, MS [facility] SLP Acute Rehab Services Pager [phone] Office [phone]               Medications:  Scheduled: . acyclovir  400 mg Oral Daily  . aspirin  81 mg Oral Daily  . budesonide (PULMICORT) nebulizer solution  0.5 mg Nebulization BID  . chlorhexidine  15 mL Mouth Rinse BID  . Chlorhexidine Gluconate Cloth  6 each Topical Daily  . enoxaparin (LOVENOX) injection  30 mg Subcutaneous Q24H  . insulin aspart  2-6 Units Subcutaneous Q4H  . ipratropium-albuterol  3 mL Nebulization QID  . mouth rinse  15 mL Mouth Rinse q12n4p  . methylPREDNISolone (SOLU-MEDROL) injection  40 mg Intravenous Q8H  . multivitamin  15 mL Oral Daily  . pantoprazole sodium  40 mg Oral Daily   Continuous: . sodium chloride 10 mL/hr at 12/15/18 1000  .  ceFAZolin (ANCEF) IV Stopped (12/15/18 0536)  . feeding supplement (VITAL AF 1.2 CAL) Stopped (12/13/18 1000)   CLE:XNTZGY chloride, acetaminophen (TYLENOL) oral liquid 160 mg/5 mL **OR** acetaminophen, guaiFENesin, labetalol, ondansetron **OR** ondansetron (ZOFRAN) IV, oxyCODONE-acetaminophen, sodium chloride flush    Assessment/Plan:  Acute on chronic respiratory failure with hypoxia This is most likely due to the pneumonia.  Patient had to be intubated.  He was successfully extubated on 2/9.  Seems to be stable from a respiratory standpoint.  Pulmonology has been following.  Continue to wean down oxygen as tolerated.  Mobilize.    Healthcare associated pneumonia involving the right lower lobe, presumed MSSA Patient has a history of bronchiectasis.  He has been immunosuppressed due to chemotherapy.  No  evidence for invasive aspergillosis per pulmonology.  He does however have a history of colonization.  Patient currently on cefazolin and acyclovir.  ID is following.  Seen by speech therapy.  On a diet now.  MSSA bacteremia/septic shock He has been off of pressors.  Vital signs are stable.  Sepsis physiology has resolved.  Echocardiogram did not show any endocarditis.  Antibiotics as per infectious disease.  As of now plan is for at least 3 weeks of IV cefazolin.  He will need a PICC line.  Currently has a central line.  Paroxysmal atrial fibrillation Patient had a brief run of on 2/8.  He converted back to sinus rhythm spontaneously.  Echocardiogram report as above.  TSH noted to be 0.20.  Free T4 normal at 1.16.  Atrial fibrillation occurred in the setting of sepsis and infection.  Has not had any further episodes.  Remains in sinus rhythm.  If he does not have any further episodes there is no need to initiate anticoagulation.  His chads 2 vascular score appears to be 0.  Abdominal pain with loose stools Hold laxatives.  Abdominal film did not show any acute findings.  Pain likely due to his loose stools.  Continue to monitor for now.    History of bronchiectasis and COPD He has a history of alpha-1 antitrypsin deficiency.  He is followed by pulmonology in the outpatient setting.  He is on steroids currently.  Currently on Pulmicort and nebulizer treatments.  Change to oral steroids.  Reduce frequency of CBG monitoring.  Chronic left hydropneumothorax He has had VATS procedure remotely for empyema.  Per pulmonology there is no indication of any active process in that area.  History of multiple myeloma Revlimid is on hold.  Acyclovir has been continued.  Followed by Dr. Irene Limbo.  Mild hypernatremia Sodium level normal this morning. Microcytic anemia Hemoglobin stable.  No evidence of overt bleeding.  DVT Prophylaxis: Lovenox Code Status: Full code Family Communication: Discussed with the  patient.  No family at bedside Disposition Plan: Management as outlined above.  Okay for transfer to telemetry for now.      LOS: 8 days   Ivonne [place] [employer] on [email or web address]  12/15/2018, 10:38 AM

## 2018-12-15 NOTE — Progress Notes (Signed)
Patient ID: [name] [name], male   DOB: 05-24-1961, 58 y.o.   MRN: [id number]         [facility]    Date of Admission:  12/06/2018    Total days of antibiotics 10        Day 8 cefazolin  He is improving on therapy for MSSA pneumonia complicated by transient bacteremia.  Only 1 of 2 admission blood cultures were negative.  Repeat blood cultures on 12/07/2018 were negative within 36 hours of admission.  No vegetations were seen on transthoracic echocardiogram.  He is quite thin and it sounds like the study was on fairly high quality.  I recommended treating him for a total of 3 weeks with IV cefazolin, then monitor closely and repeat blood cultures in 1 to 2 weeks.  I will sign off now.  Diagnosis: Pneumonia with bacteremia  Culture Result: MSSA  No Known Allergies  OPAT Orders Discharge antibiotics: Per pharmacy protocol cefazolin  Duration: 3 weeks End Date: 12/26/2018  [facility] Care Per Protocol:  Labs weekly while on IV antibiotics: _x_ CBC with differential _x_ BMP __ CMP __ CRP __ ESR __ Vancomycin trough __ CK  _x_ Please pull PIC at completion of IV antibiotics __ Please leave PIC in place until doctor has seen patient or been notified  Fax weekly labs to [phone]  Clinic Follow Up Appt: Follow-up with me arranged on 01/11/2019         Michel Bickers, MD [facility] [phone] pager   [phone] cell 12/15/2018, 11:53 AM

## 2018-12-15 NOTE — Evaluation (Signed)
Physical Therapy Evaluation Patient Details Name: [name] [name] MRN: [id number] DOB: 01-Dec-1960 Today's Date: 12/15/2018   History of Present Illness  Pt adm to [facility] on 12/06/18 with difficulty breathing. Pt with medical diagnoses of sepsis, bacteremia due to methicillin suspectible staphylococcus aureus, R lower lobe PNA. PMH includes bronchiectasis, COPD, Alpha 1 MZ, multiple myeloma, HTN.  He required intubation from 2/5-12/13/2018.    Clinical Impression   Pt presents with LE weakness, general deconditioning, impaired sitting and standing balance, difficulty performing mobility tasks, and decreased tolerance for activity due to weakness and desaturations. Pt to benefit from acute PT to address deficits. Pt ambulated 3 ft from bed to recliner with use of RW and min assist. Pt sats 88-96% on 2LO2, RR 18-28, and HR up to 120s with mobility. PT recommending SNF given pt's presentation on eval, will continue to assess during hospital stay. PT to progress mobility as tolerated, and will continue to follow acutely.      Follow Up Recommendations SNF;Supervision/Assistance - 24 hour    Equipment Recommendations  Other (comment)(may need RW, pending pt progress)    Recommendations for Other Services       Precautions / Restrictions Precautions Precautions: Fall Precaution Comments: Pt on 2LO2 via [place]  Restrictions Weight Bearing Restrictions: No      Mobility  Bed Mobility Overal bed mobility: Needs Assistance Bed Mobility: Supine to Sit     Supine to sit: Min assist;HOB elevated     General bed mobility comments: Min assist for LE translation to EOB and trunk support transiting from supine to sit. Pt reporting momentary dizziness sitting EOB, passed quickly.   Transfers Overall transfer level: Needs assistance Equipment used: Rolling walker (2 wheeled) Transfers: Sit to/from Stand Sit to Stand: Min assist;From elevated surface;+2 safety/equipment         General transfer comment:  Min assist for steadying upon standing, pt not requiring physical assist to power up to stand. Pt with sit to stand x2, after pt's first sit to stand he sat down due to legs feeling "heavy" and pt reporting dizziness. Sats to 88%, recovered with deep breathing and breathing in through nose, out through mouth. BP difficult to attain accurate reading due to pt moving UE during reading.  Ambulation/Gait Ambulation/Gait assistance: +2 safety/equipment;Min assist Gait Distance (Feet): 3 Feet Assistive device: Rolling walker (2 wheeled) Gait Pattern/deviations: Step-through pattern;Decreased stride length;Narrow base of support Gait velocity: decr    General Gait Details: Min assist for steadying, directing RW. Pt complaining of LE heaviness, difficulty performing hip flexion anti-gravity. BP taken post-ambulation, BP WNL.   Stairs            Wheelchair Mobility    Modified Rankin (Stroke Patients Only)       Balance Overall balance assessment: Needs assistance Sitting-balance support: Bilateral upper extremity supported Sitting balance-Leahy Scale: Fair     Standing balance support: Bilateral upper extremity supported Standing balance-Leahy Scale: Poor Standing balance comment: relies on RW and PT for steadying                             Pertinent Vitals/Pain Pain Assessment: No/denies pain    Home Living Family/patient expects to be discharged to:: Private residence Living Arrangements: Other relatives(Pt lives with niece ) Available Help at Discharge: Family;Available PRN/intermittently Type of Home: House Home Access: Stairs to enter Entrance Stairs-Rails: Right;Left Entrance Stairs-Number of Steps: 2 Home Layout: One level Home Equipment: None  Prior Function Level of Independence: Independent         Comments: Pt states he was doing everything for himself PTA, including driving to all appointments in the community. Pt states his niece does not  drive      Hand Dominance   Dominant Hand: Right    Extremity/Trunk Assessment   Upper Extremity Assessment Upper Extremity Assessment: Generalized weakness    Lower Extremity Assessment Lower Extremity Assessment: Generalized weakness(able to perform bilateral quad sets, SLR with quad lag due to weakness, ankle pumps, LAQ lacking 10* extension due to suspected weakness)    Cervical / Trunk Assessment Cervical / Trunk Assessment: Normal  Communication   Communication: Prefers language other than English(Needs Interpreter: Dion Body - interpreter present for duration of session )  Cognition Arousal/Alertness: Awake/alert Behavior During Therapy: WFL for tasks assessed/performed Overall Cognitive Status: Within Functional Limits for tasks assessed                                        General Comments General comments (skin integrity, edema, etc.): during session, pt with RR 18-28, HR up to 120s, and BP WNL for pt. Pt sats on 2LO2 dropped to 88% with activity, recovered >90% with rest and breathing techniques.     Exercises General Exercises - Lower Extremity Quad Sets: AROM;Both;5 reps;Seated Long Arc Quad: AROM;Both;5 reps;Seated Straight Leg Raises: AROM;Both;5 reps;Seated   Assessment/Plan    PT Assessment Patient needs continued PT services  PT Problem List Decreased strength;Cardiopulmonary status limiting activity;Decreased activity tolerance;Decreased knowledge of use of DME;Decreased balance;Decreased mobility       PT Treatment Interventions DME instruction;Therapeutic activities;Gait training;Therapeutic exercise;Patient/family education;Balance training;Stair training;Functional mobility training    PT Goals (Current goals can be found in the Care Plan section)  Acute Rehab PT Goals Patient Stated Goal: get stronger, return to PLOF  PT Goal Formulation: With patient Time For Goal Achievement: 12/29/18 Potential to Achieve Goals:  Good    Frequency Min 3X/week(for mobility progression )   Barriers to discharge        Co-evaluation               AM-PAC PT "6 Clicks" Mobility  Outcome Measure Help needed turning from your back to your side while in a flat bed without using bedrails?: A Lot Help needed moving from lying on your back to sitting on the side of a flat bed without using bedrails?: A Lot Help needed moving to and from a bed to a chair (including a wheelchair)?: A Little Help needed standing up from a chair using your arms (e.g., wheelchair or bedside chair)?: A Little Help needed to walk in hospital room?: A Little Help needed climbing 3-5 steps with a railing? : A Lot 6 Click Score: 15    End of Session Equipment Utilized During Treatment: Gait belt;Oxygen Activity Tolerance: Patient limited by fatigue Patient left: in chair;with chair alarm set;with call bell/phone within reach;with SCD's reapplied(translator present) Nurse Communication: Mobility status PT Visit Diagnosis: Muscle weakness (generalized) (M62.81);Other abnormalities of gait and mobility (R26.89)    Time: 5093-2671 PT Time Calculation (min) (ACUTE ONLY): 25 min   Charges:   PT Evaluation $PT Eval Low Complexity: 1 Low PT Treatments $Gait Training: 8-22 mins        Julien Girt, PT Acute Rehabilitation Services Pager [phone]  Office [phone]   Carrah Eppolito D Larita Deremer 12/15/2018, 1:40 PM

## 2018-12-15 NOTE — Progress Notes (Signed)
  Speech Language Pathology Treatment: Dysphagia  Patient Details Name: [name] [name] MRN: [id number] DOB: June 15, 1961 Today's Date: 12/15/2018 Time: 9977-4142 SLP Time Calculation (min) (ACUTE ONLY): 17 min  Assessment / Plan / Recommendation Clinical Impression  SLP saw pt to assure po tolerance and to educate him to importance to take rest breaks if dyspneic.  He denies coughing associated with intake.  Admits he does become mildly short of breath with intake thus advised pt to consume warm foods on tray first and save those that will remain at room temperature for later.    Using interpreter [facility] education completed, no follow up needed.   Messaged PT, Dr Maryland Pink and Dr Carlos Levering to inform interpreter here today until 1300.      HPI HPI: Pt adm too [facility] with difficulty breathing.  Pt has h/o bronchiectasis, COPD, Alpha 1 MZ.  He required intubation from 2/5-12/13/2018.  CXR showing increased right lower lobe pna. Swallow evaluation ordered.        SLP Plan  All goals met       Recommendations  Diet recommendations: Regular;Thin liquid Liquids provided via: Straw;Cup Medication Administration: Whole meds with liquid Supervision: Patient able to self feed Compensations: Slow rate;Small sips/bites Postural Changes and/or Swallow Maneuvers: Seated upright 90 degrees;Upright 30-60 min after meal                Oral Care Recommendations: Oral care BID Follow up Recommendations: None Plan: All goals met       GO                Macario Golds 12/15/2018, 11:41 AM   Luanna Salk, MS [facility] SLP Acute Rehab Services Pager [phone] Office [phone]

## 2018-12-16 ENCOUNTER — Inpatient Hospital Stay: Payer: Medicare Other

## 2018-12-16 ENCOUNTER — Inpatient Hospital Stay: Payer: Medicare Other | Admitting: Hematology

## 2018-12-16 ENCOUNTER — Inpatient Hospital Stay: Payer: Self-pay

## 2018-12-16 LAB — BASIC METABOLIC PANEL
Anion gap: 6 (ref 5–15)
BUN: 17 mg/dL (ref 6–20)
CHLORIDE: 102 mmol/L (ref 98–111)
CO2: 32 mmol/L (ref 22–32)
Calcium: 6.6 mg/dL — ABNORMAL LOW (ref 8.9–10.3)
Creatinine, Ser: 0.53 mg/dL — ABNORMAL LOW (ref 0.61–1.24)
GFR calc Af Amer: >60 mL/min (ref >60)
GFR calc non Af Amer: >60 mL/min (ref >60)
Glucose, Bld: 139 mg/dL — ABNORMAL HIGH (ref 70–99)
Potassium: 3.6 mmol/L (ref 3.5–5.1)
Sodium: 140 mmol/L (ref 135–145)

## 2018-12-16 LAB — GLUCOSE, CAPILLARY
Glucose-Capillary: 107 mg/dL — ABNORMAL HIGH (ref 70–99)
Glucose-Capillary: 113 mg/dL — ABNORMAL HIGH (ref 70–99)
Glucose-Capillary: 184 mg/dL — ABNORMAL HIGH (ref 70–99)
Glucose-Capillary: 249 mg/dL — ABNORMAL HIGH (ref 70–99)

## 2018-12-16 LAB — CBC
HCT: 27 % — ABNORMAL LOW (ref 39.0–52.0)
Hemoglobin: 8.3 g/dL — ABNORMAL LOW (ref 13.0–17.0)
MCH: 24.3 pg — ABNORMAL LOW (ref 26.0–34.0)
MCHC: 30.7 g/dL (ref 30.0–36.0)
MCV: 79.2 fL — ABNORMAL LOW (ref 80.0–100.0)
PLATELETS: 270 10*3/uL (ref 150–400)
RBC: 3.41 MIL/uL — ABNORMAL LOW (ref 4.22–5.81)
RDW: 16.8 % — AB (ref 11.5–15.5)
WBC: 5.5 10*3/uL (ref 4.0–10.5)
nRBC: 0 % (ref 0.0–0.2)

## 2018-12-16 MED ORDER — SODIUM CHLORIDE 0.9% FLUSH: 10.0000 mL | INTRAVENOUS | Status: DC | PRN

## 2018-12-16 MED ORDER — ENSURE ENLIVE PO LIQD
237.0000 mL | Freq: Two times a day (BID) | ORAL | Status: DC
  Administered 2018-12-16 – 2018-12-19 (×6): 237 mL via ORAL

## 2018-12-16 MED ORDER — IPRATROPIUM-ALBUTEROL 0.5-2.5 (3) MG/3ML IN SOLN
3.0000 mL | Freq: Three times a day (TID) | RESPIRATORY_TRACT | Status: DC
  Administered 2018-12-16 – 2018-12-19 (×9): 3 mL via RESPIRATORY_TRACT
  Filled 2018-12-16 (×2): qty 3
  Filled 2018-12-16: qty 36
  Filled 2018-12-16 (×7): qty 3

## 2018-12-16 MED ORDER — ALBUTEROL SULFATE (2.5 MG/3ML) 0.083% IN NEBU
2.5000 mg | INHALATION_SOLUTION | RESPIRATORY_TRACT | Status: DC | PRN
  Filled 2018-12-16: qty 3

## 2018-12-16 NOTE — Progress Notes (Signed)
PT Cancellation Note  Patient Details Name: [name] [name] MRN: [id number] DOB: 03/08/1961   Cancelled Treatment:    Reason Eval/Treat Not Completed: Patient at procedure or test/unavailable;Other (comment) - Upon first attempt to see pt at 1130, pt had just ambulated with RN and RN states pt got winded. Upon second check at 1245, pt getting picc line. PT to check back as schedule allows.  Julien Girt, PT Acute Rehabilitation Services Pager [phone]  Office [phone]    [name] 12/16/2018, 12:52 PM

## 2018-12-16 NOTE — Progress Notes (Signed)
Peripherally Inserted Central Catheter/Midline Placement  The IV Nurse has discussed with the patient and/or persons authorized to consent for the patient, the purpose of this procedure and the potential benefits and risks involved with this procedure.  The benefits include less needle sticks, lab draws from the catheter, and the patient may be discharged home with the catheter. Risks include, but not limited to, infection, bleeding, blood clot (thrombus formation), and puncture of an artery; nerve damage and irregular heartbeat and possibility to perform a PICC exchange if needed/ordered by physician.  Alternatives to this procedure were also discussed.  Bard Power PICC patient education guide, fact sheet on infection prevention and patient information card has been provided to patient /or left at bedside.    PICC/Midline Placement Documentation  PICC Single Lumen 12/16/18 PICC Right Brachial 35 cm 0 cm (Active)  Indication for Insertion or Continuance of Line Home intravenous therapies (PICC only) 12/16/2018  1:28 PM  Exposed Catheter (cm) 0 cm 12/16/2018  1:28 PM  Site Assessment Clean;Dry;Intact 12/16/2018  1:28 PM  Line Status Flushed;Blood return noted;Saline locked 12/16/2018  1:28 PM  Dressing Type Transparent;Securing device 12/16/2018  1:28 PM  Dressing Status Clean;Dry;Intact;Antimicrobial disc in place 12/16/2018  1:28 PM  Dressing Change Due 12/23/18 12/16/2018  1:28 PM       [name] [name] 12/16/2018, 1:30 PM

## 2018-12-16 NOTE — Progress Notes (Signed)
PHARMACY CONSULT NOTE FOR:  OUTPATIENT  PARENTERAL ANTIBIOTIC THERAPY (OPAT)  Indication: MSSA bacteremia Regimen: cefazolin 2gm q8h End date: 12/26/2018  IV antibiotic discharge orders are pended. To discharging provider:  please sign these orders via discharge navigator,  Select New Orders & click on the button choice - Manage This Unsigned Work.     Thank you for allowing pharmacy to be a part of this patient's care.  Doreene Eland, PharmD, BCPS.   Work Cell: [phone] 12/16/2018 8:32 AM

## 2018-12-16 NOTE — Progress Notes (Signed)
Nutrition Follow-up  DOCUMENTATION CODES:   Severe malnutrition in context of chronic illness, Underweight  INTERVENTION:  - Will order Ensure Enlive BID, each supplement provides 350 kcal and 20 grams of protein. - Continue Magic Cup with dinner meals, each supplement provides 290 kcal and 9 grams of protein. - Continue to encourage PO intakes.    NUTRITION DIAGNOSIS:   Severe Malnutrition related to chronic illness, cancer and cancer related treatments as evidenced by moderate fat depletion, severe fat depletion, moderate muscle depletion, severe muscle depletion. -ongoing  GOAL:   Patient will meet greater than or equal to 90% of their needs, Weight gain -unmet  MONITOR:   PO intake, Supplement acceptance, Weight trends, Labs  ASSESSMENT:   58 year-old male with past medical history significant for multiple myeloma on Revlimid,  pulmonary HTN, chronic hypoxic respiratory failure, chronic L-sided hydropneumothorax, bronchiectasis, allergic bronchopulmonary aspergillosis, and heterozygous alpha 1 antitrypsin deficiency. Patient presented to the ED with SOB, worsening productive cough, and hyemoptysis x3 days PTA. He reported fever with temperature 100 at home. He denies chest dain, nausea, vomiting. Patient was admitted with dx of HCAP; s/p CXR.  Patient was intubated overnight on 2/5 and extubated on 2/9 at 10:00 AM; OGT removed at that time. Diet advanced from NPO to Regular on 2/10 at 3:05 PM. Per flow sheet, patient consumed 75% of dinner that evening (742 kcal, 36 grams of protein). SLP has signed off. Spoke with RN and tech this AM; patient consumed >50% of breakfast meal.   Per Dr. Lyman Speller note yesterday AM: continue to wean O2 as tolerated, HCAP--presumed MSSA bacteremia, abdominal pain and loose stools with plan to hold laxatives, chronic L hydropneumothorax, hx of multiple myeloma.   Medications reviewed; sliding scale novolog, 15 ml liquid multivitamin/day, 40 mg oral  protonix/day, 60 mg deltasone BID. Labs reviewed; CBG: 107 mg/dl today, creatinine: 0.53 mg/dl, Ca: 6.6 mg/dl.      Diet Order:   Diet Order            Diet regular Room service appropriate? Yes; Fluid consistency: Thin  Diet effective now              EDUCATION NEEDS:   No education needs have been identified at this time  Skin:  Skin Assessment: Reviewed RN Assessment  Last BM:  2/10  Height:   Ht Readings from Last 1 Encounters:  12/09/18 5' (1.524 m)    Weight:   Wt Readings from Last 1 Encounters:  12/15/18 41.2 kg    Ideal Body Weight:  48.18 kg  BMI:  Body mass index is 17.74 kg/m.  Estimated Nutritional Needs:   Kcal:  1565-1730 kcal  Protein:  62-74 grams  Fluid:  >/= 1.6 L/day     Jarome Matin, MS, RD, LDN, [facility] Inpatient Clinical Dietitian Pager # [phone] After hours/weekend pager # [phone]

## 2018-12-16 NOTE — Progress Notes (Signed)
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 Admitted with dyspnea, cough, hemoptysis and chest x-ray evidence for R HCAP.Respiratory culture negative, blood culture MSSA.  Had to be transferred to the ICU and was intubated.  Subsequently extubated.  Had a brief run of A. fib.  Assessment & Plan:   Principal Problem:   Bacteremia due to methicillin susceptible Staphylococcus aureus (MSSA) Active Problems:   Pleural effusion, left   COPD GOLD IV criteria but never smoked    Multiple myeloma (HCC)   Acute on chronic respiratory failure with hypoxia (HCC)   Grade II diastolic dysfunction   Community acquired pneumonia   Protein-calorie malnutrition, severe   Sepsis ([place])   Alpha-1-antitrypsin deficiency ([place])   Bronchiectasis ([place])   Acute respiratory distress  Acute on chronic respiratory failure with hypoxia This is most likely due to the pneumonia.  Patient had to be intubated.  He was successfully extubated on 2/9.  Seems to be stable from a respiratory standpoint.  Pulmonology has been following. Cont to wean O2 as tolerated.    Healthcare associated pneumonia involving the right lower lobe, presumed MSSA Patient has a history of bronchiectasis.  He has been immunosuppressed due to chemotherapy.  No evidence for invasive aspergillosis per pulmonology.  He does however have a history of colonization.  Patient currently on cefazolin and acyclovir.  ID is following.  Pt seen by speech  MSSA bacteremia/septic shock He has been off of pressors.  Vital signs are stable.  Sepsis physiology has resolved.  Echocardiogram did not show any endocarditis.  Antibiotics as per infectious disease.  As of now plan is for at least 3 weeks of IV cefazolin.  PICC placed  2/12.  Paroxysmal atrial fibrillation Patient had a brief run of on 2/8.  He converted back to sinus rhythm spontaneously.  Echocardiogram report as above.  TSH noted to be 0.20.  Free T4 normal at 1.16.  Atrial fibrillation occurred in the setting of sepsis and infection.  Has not had any further episodes.  Remains in sinus rhythm.  If he does not have any further episodes there is no need to initiate anticoagulation. CHADS-VASc noted to be 0  Abdominal pain with loose stools Hold laxatives.  Abdominal film did not show any acute findings.  Suspect pain related to his loose stools.  Continue to monitor for now.    History of bronchiectasis and COPD He has a history of alpha-1 antitrypsin deficiency.  He is followed by pulmonology in the outpatient setting.  He is on steroids currently.  Currently on Pulmicort and nebulizer treatments.  weaned to PO steroids  Chronic left hydropneumothorax He has had VATS procedure remotely for empyema.  Per pulmonology there is no indication of any active process in that area. Stable at present  History of multiple myeloma Revlimid remains on hold.  Acyclovir has been continued.  Followed by Dr. Irene Limbo.  Mild hypernatremia Sodium reviewed,currently within normal limits  Microcytic anemia Hemoglobin stable.  No evidence of overt bleeding. Hemodynamically stable  DVT prophylaxis: Lovenox subQ Code Status: Full Family Communication: Pt in room, family not at bedside Disposition Plan: Uncertain at this time  Consultants:   PCCM  ID  Procedures:  Right internal jugular triple-lumen catheter placed on 2/5 Was intubated from 2/5 to 2/9.  Transthoracic  echocardiogram IMPRESSIONS 1. The left ventricle has normal systolic function of 78-24%. The cavity size is normal. There is no increased left ventricular wall thickness. Echo evidence of normal diastolic filling patterns.The left ventricular diastology could not be evaluated due to  nondiagnostic images. 2. The right ventricle is normal in size. There is no increase in right ventricular wall thickness. There is moderately reduced systolic function. Right ventricular systolic pressure is severely elevated with an estimated pressure of 79.4 mmHg. 3. Normal left atrial size. 4. Mildly dilated right atrial size. 5. The aortic valve is tricuspid in structure. There is mild sclerosis of the aortic valve. 6. The anterior mitral valve leaflet is elongated with no obvious prolapse. There is trivial mitral regurgitation. 7. The tricuspid valve is normal in structure. Regurgitation is moderate. 8. Pulmonic valve regurgitation is mild to moderate is mild by color flow Doppler. 9. There is a trivial pericardial effusion anterior to the right ventricle. 10. In comparison to the previous echocardiogram(s): The right ventricular systolic function is worse.  Antimicrobials: Anti-infectives (From admission, onward)   Start     Dose/Rate Route Frequency Ordered Stop   12/14/18 1600  acyclovir (ZOVIRAX) tablet 400 mg     400 mg Oral Daily 12/14/18 1533     12/11/18 1000  acyclovir (ZOVIRAX) 200 MG/5ML suspension SUSP 400 mg  Status:  Discontinued     400 mg Oral Daily 12/10/18 1410 12/14/18 1527   12/08/18 0100  vancomycin (VANCOCIN) IVPB 750 mg/150 ml premix  Status:  Discontinued     750 mg 150 mL/hr over 60 Minutes Intravenous Every 36 hours 12/06/18 1441 12/07/18 0919   12/07/18 2200  ceFAZolin (ANCEF) IVPB 2g/100 mL premix     2 g 200 mL/hr over 30 Minutes Intravenous Every 8 hours 12/07/18 1828     12/07/18 0100  ceFEPIme (MAXIPIME) 1 g in sodium chloride 0.9 % 100 mL IVPB  Status:  Discontinued     1 g 200 mL/hr over 30 Minutes Intravenous Every 12 hours 12/06/18 1441 12/07/18 1812   12/06/18 1830  acyclovir (ZOVIRAX) tablet 400 mg  Status:  Discontinued     400 mg Oral Daily 12/06/18 1757 12/10/18 1410   12/06/18 1145  vancomycin (VANCOCIN) IVPB 750 mg/150 ml premix      750 mg 150 mL/hr over 60 Minutes Intravenous STAT 12/06/18 1136 12/06/18 1402   12/06/18 1130  ceFEPIme (MAXIPIME) 1 g in sodium chloride 0.9 % 100 mL IVPB     1 g 200 mL/hr over 30 Minutes Intravenous  Once 12/06/18 1129 12/06/18 1256       Subjective: Without complaints at this time  Objective: Vitals:   12/15/18 2247 12/16/18 0405 12/16/18 0802 12/16/18 1332  BP:  (!) 142/86  (!) 142/93  Pulse: 79 65 70 68  Resp:  _0 Temp:  98.1 F (36.7 C)  98.2 F (36.8 C)  TempSrc:  Oral    SpO2: 99% 97% 98% 100%  Weight:      Height:        Intake/Output Summary (Last 24 hours) at 12/16/2018 1339 Last data filed at 12/16/2018 1300 Gross per 24 hour  Intake 803.07 ml  Output 850 ml  Net -46.93 ml   Filed Weights   12/13/18 0452 12/14/18 0500 12/15/18 0500  Weight: 45.6 kg 42 kg 41.2 kg    Examination:  General exam: Appears calm and comfortable  Respiratory system: Clear to auscultation. Respiratory effort normal. Cardiovascular system: S1 & S2  heard, RRR Gastrointestinal system: Abdomen is nondistended, soft and nontender. No organomegaly or masses felt. Normal bowel sounds heard. Central nervous system: Alert and oriented. No focal neurological deficits. Extremities: Symmetric 5 x 5 power. Skin: No rashes, lesions Psychiatry: Judgement and insight appear normal. Mood & affect appropriate.   Data Reviewed: I have personally reviewed following labs and imaging studies  CBC: Recent Labs  Lab 12/12/18 0400 12/13/18 0500 12/14/18 0506 12/15/18 0515 12/16/18 0322  WBC 5.0 8.4 7.4 5.9 5.5  HGB 8.7* 8.5* 8.5* 8.4* 8.3*  HCT 27.3* 26.6* 27.2* 27.1* 27.0*  MCV 77.8* 78.0* 79.3* 79.9* 79.2*  PLT 98* 117* 177 214 078   Basic Metabolic Panel: Recent Labs  Lab 12/10/18 0309 12/11/18 0330 12/12/18 0400 12/13/18 0500 12/14/18 0506 12/15/18 0515 12/16/18 0322  NA 144 143 143 147* 148* 141 140  K 5.4* 4.4 4.0 3.2* 3.6 3.5 3.6  CL 118* 114* 111 105 108 104  102  CO2 20* 26 29 37* 34* 31 32  GLUCOSE 155* 164* 175* 154* 119* 132* 139*  BUN 34* 35* 34* 34* 30* 23* 17  CREATININE 1.00 0.87 0.66 0.69 0.65 0.59* 0.53*  CALCIUM 7.2* 6.6* 6.3* 6.3* 6.5* 6.5* 6.6*  MG 2.2 2.1 2.0 1.8 2.0  --   --   PHOS 1.3* 1.7* 2.4* 2.1* 2.1*  --   --    GFR: Estimated Creatinine Clearance: 60.1 mL/min (A) (by C-G formula based on SCr of 0.53 mg/dL (L)). Liver Function Tests: No results for input(s): AST, ALT, ALKPHOS, BILITOT, PROT, ALBUMIN in the last 168 hours. No results for input(s): LIPASE, AMYLASE in the last 168 hours. No results for input(s): AMMONIA in the last 168 hours. Coagulation Profile: No results for input(s): INR, PROTIME in the last 168 hours. Cardiac Enzymes: No results for input(s): CKTOTAL, CKMB, CKMBINDEX, TROPONINI in the last 168 hours. BNP (last 3 results) No results for input(s): PROBNP in the last 8760 hours. HbA1C: No results for input(s): HGBA1C in the last 72 hours. CBG: Recent Labs  Lab 12/15/18 1146 12/15/18 1628 12/15/18 2201 12/16/18 0728 12/16/18 1145  GLUCAP 161* 108* 131* 107* 113*   Lipid Profile: No results for input(s): CHOL, HDL, LDLCALC, TRIG, CHOLHDL, LDLDIRECT in the last 72 hours. Thyroid Function Tests: Recent Labs    12/15/18 0515  TSH 0.209*  FREET4 1.16   Anemia Panel: No results for input(s): VITAMINB12, FOLATE, FERRITIN, TIBC, IRON, RETICCTPCT in the last 72 hours. Sepsis Labs: No results for input(s): PROCALCITON, LATICACIDVEN in the last 168 hours.  Recent Results (from the past 240 hour(s))  MRSA PCR Screening     Status: None   Collection Time: 12/06/18  5:41 PM  Result Value Ref Range Status   MRSA by PCR NEGATIVE NEGATIVE Final    Comment:        The GeneXpert MRSA Assay (FDA approved for NASAL specimens only), is one component of a comprehensive MRSA colonization surveillance program. It is not intended to diagnose MRSA infection nor to guide or monitor treatment for MRSA  infections. Performed at [facility], [place] [address], [place], [place] [address]   Culture, blood (routine x 2)     Status: None   Collection Time: 12/07/18  6:34 PM  Result Value Ref Range Status   Specimen Description   Final    BLOOD RIGHT HAND Performed at [facility] [address] [place], [place] [address]    Special Requests   Final  BOTTLES DRAWN AEROBIC ONLY Blood Culture results may not be optimal due to an inadequate volume of blood received in culture bottles Performed at [facility], [place] [address] [place], [place] [address]    Culture   Final    NO GROWTH 5 DAYS Performed at [facility], [place] [address] [place], [place] [address]    Report Status 12/12/2018 FINAL  Final  Culture, blood (routine x 2)     Status: None   Collection Time: 12/07/18  6:35 PM  Result Value Ref Range Status   Specimen Description   Final    BLOOD RIGHT ARM Performed at [facility] [address] [place], [place] [address]    Special Requests   Final    BOTTLES DRAWN AEROBIC ONLY Blood Culture results may not be optimal due to an inadequate volume of blood received in culture bottles Performed at [facility] [address] [place], [place] [address]    Culture   Final    NO GROWTH 5 DAYS Performed at [facility], [place] [address] [place], [place] [address]    Report Status 12/12/2018 FINAL  Final  Aspergillus Ag, BAL/Serum     Status: Abnormal   Collection Time: 12/09/18  5:12 PM  Result Value Ref Range Status   Aspergillus Ag, BAL/Serum 1.75 (H) 0.00 - 0.49 Index Final    Comment: (NOTE) Performed At: [facility] [address] [place], [place] [id number] Rush Farmer MD SW:[id number] Performed At: [facility] [address] [place], [place] [id number] Nechama Guard MD VX:[id number]   Culture, respiratory     Status: None   Collection  Time: 12/10/18 11:20 AM  Result Value Ref Range Status   Specimen Description   Final    TRACHEAL ASPIRATE Performed at [facility] [address], [place], [place] [address]    Special Requests   Final    NONE Performed at [facility], [place] [address], [place], [place] [address]    Gram Stain   Final    RARE WBC PRESENT, PREDOMINANTLY PMN FEW GRAM POSITIVE COCCI IN CLUSTERS Performed at [facility], [place] [address], [place], [place] [address]    Culture RARE STAPHYLOCOCCUS AUREUS  Final   Report Status 12/12/2018 FINAL  Final   Organism ID, Bacteria STAPHYLOCOCCUS AUREUS  Final      Susceptibility   Staphylococcus aureus - MIC*    CIPROFLOXACIN <=0.5 SENSITIVE Sensitive     ERYTHROMYCIN >=8 RESISTANT Resistant     GENTAMICIN <=0.5 SENSITIVE Sensitive     OXACILLIN 0.5 SENSITIVE Sensitive     TETRACYCLINE <=1 SENSITIVE Sensitive     VANCOMYCIN <=0.5 SENSITIVE Sensitive     TRIMETH/SULFA <=10 SENSITIVE Sensitive     CLINDAMYCIN <=0.25 SENSITIVE Sensitive     RIFAMPIN <=0.5 SENSITIVE Sensitive     Inducible Clindamycin NEGATIVE Sensitive     * RARE STAPHYLOCOCCUS AUREUS     Radiology Studies: Dg Abd 2 Views  Result Date: 12/14/2018 CLINICAL DATA:  Abdominal pain. EXAM: ABDOMEN - 2 VIEW COMPARISON:  Chest x-ray December 13, 2018 FINDINGS: Suspected small loculated left basilar pneumothorax, unchanged since yesterday's chest x-ray. Opacity in the right base remains. The bowel gas pattern is nonobstructive. The abdomen is otherwise unremarkable. A left side down decubitus film demonstrates no free air. Calcification is seen in the right basilar pleura or parenchyma. No other acute abnormalities. IMPRESSION: No acute abnormality in  the abdomen. A right basilar infiltrate. Suspected loculated pneumothorax in the left base, described on yesterday's chest x-ray. Electronically Signed   By: Dorise Bullion III M.D   On: 12/14/2018 14:34   Dg  Swallowing Func-speech Pathology  Result Date: 12/14/2018 Objective Swallowing Evaluation: Type of Study: MBS-Modified Barium Swallow Study  Patient Details Name: [name] [name] MRN: [id number] Date of Birth: 01-30-1961 Today's Date: 12/14/2018 Time: SLP Start Time (ACUTE ONLY): 1430 -SLP Stop Time (ACUTE ONLY): 1455 SLP Time Calculation (min) (ACUTE ONLY): 25 min Past Medical History: Past Medical History: Diagnosis Date . Acute on chronic respiratory failure ([place]) 02/07/2018 . Hypertension  . Kidney stones  . Multiple myeloma ([place])  . Obstructive lung disease (generalized) ([place])  . Pulmonary hypertension ([place])  Past Surgical History: Past Surgical History: Procedure Laterality Date . CARDIAC CATHETERIZATION N/A 01/18/2016  Procedure: Right Heart Cath;  Surgeon: Jolaine Artist, MD;  Location: [facility];  Service: Cardiovascular;  Laterality: N/A; . CARDIAC CATHETERIZATION N/A 06/24/2016  Procedure: Right Heart Cath;  Surgeon: Jolaine Artist, MD;  Location: [facility];  Service: Cardiovascular;  Laterality: N/A; . IR RADIOLOGIST EVAL & MGMT  05/27/2018 . IR RADIOLOGIST EVAL & MGMT  09/01/2018 . KIDNEY SURGERY   . VIDEO ASSISTED THORACOSCOPY (VATS)/DECORTICATION Left 2003 . VIDEO ASSISTED THORACOSCOPY (VATS)/DECORTICATION Left 10/02/2015  Procedure: VIDEO ASSISTED THORACOSCOPY (VATS)/DECORTICATION and drainage of chronic empyena;  Surgeon: [name], MD;  Location: [facility];  Service: Thoracic;  Laterality: Left; Marland Kitchen VIDEO BRONCHOSCOPY N/A 10/02/2015  Procedure: VIDEO BRONCHOSCOPY;  Surgeon: [name], MD;  Location: [facility];  Service: Thoracic;  Laterality: N/A; HPI: Pt adm too [place] with difficulty breathing.  Pt has h/o bronchiectasis, COPD, Alpha 1 MZ.  He required intubation from 2/5-12/13/2018.  CXR showing increased right lower lobe pna. Swallow evaluation ordered.   Subjective: pt awake in chair Assessment / Plan / Recommendation CHL IP CLINICAL IMPRESSIONS 12/14/2018 Clinical Impression  Patient presents with functional oropharyngeal swallow ability with no aspiration or laryngeal penetration of any consistency tested *thin, nectar, pudding, cracker, and tablet with thin.  Swallow was timely and strong without residuals. Barium tablet oral transit required 2 swallows of liquids but cleared readily into esophagus.  Given pt report of sensation of residuals at pharynx and his RR up to 30s, will follow up x1 to assure po tolerance.  Thanks for allowing me to help with this pt.  Of note, pt had interpreter scheduled for two hours starting at 11 am 12/15/2018. RN informed of testing results and interpreter status.   SLP Visit Diagnosis Dysphagia, unspecified (R13.10) Attention and concentration deficit following -- Frontal lobe and executive function deficit following -- Impact on safety and function Mild aspiration risk   CHL IP TREATMENT RECOMMENDATION 12/14/2018 Treatment Recommendations Therapy as outlined in treatment plan below   Prognosis 12/14/2018 Prognosis for Safe Diet Advancement Good Barriers to Reach Goals -- Barriers/Prognosis Comment -- CHL IP DIET RECOMMENDATION 12/14/2018 SLP Diet Recommendations Regular solids;Thin liquid Liquid Administration via Cup;Straw Medication Administration (No Data) Compensations Slow rate;Small sips/bites Postural Changes Seated upright at 90 degrees;Remain semi-upright after after feeds/meals (Comment)   CHL IP OTHER RECOMMENDATIONS 12/14/2018 Recommended Consults -- Oral Care Recommendations Oral care BID Other Recommendations --   No flowsheet data found.  CHL IP FREQUENCY AND DURATION 12/14/2018 Speech Therapy Frequency (ACUTE ONLY) min 1 x/week Treatment Duration 1 week      CHL IP ORAL PHASE 12/14/2018 Oral Phase WFL Oral - Pudding Teaspoon --  Oral - Pudding Cup -- Oral - Honey Teaspoon -- Oral - Honey Cup -- Oral - Nectar Teaspoon -- Oral - Nectar Cup -- Oral - Nectar Straw -- Oral - Thin Teaspoon -- Oral - Thin Cup -- Oral - Thin Straw -- Oral - Puree --  Oral - Mech Soft -- Oral - Regular -- Oral - Multi-Consistency -- Oral - Pill -- Oral Phase - Comment --  CHL IP PHARYNGEAL PHASE 12/14/2018 Pharyngeal Phase WFL Pharyngeal- Pudding Teaspoon -- Pharyngeal -- Pharyngeal- Pudding Cup -- Pharyngeal -- Pharyngeal- Honey Teaspoon -- Pharyngeal -- Pharyngeal- Honey Cup -- Pharyngeal -- Pharyngeal- Nectar Teaspoon -- Pharyngeal -- Pharyngeal- Nectar Cup -- Pharyngeal -- Pharyngeal- Nectar Straw -- Pharyngeal -- Pharyngeal- Thin Teaspoon -- Pharyngeal -- Pharyngeal- Thin Cup -- Pharyngeal -- Pharyngeal- Thin Straw -- Pharyngeal -- Pharyngeal- Puree -- Pharyngeal -- Pharyngeal- Mechanical Soft -- Pharyngeal -- Pharyngeal- Regular -- Pharyngeal -- Pharyngeal- Multi-consistency -- Pharyngeal -- Pharyngeal- Pill -- Pharyngeal -- Pharyngeal Comment --  CHL IP CERVICAL ESOPHAGEAL PHASE 12/14/2018 Cervical Esophageal Phase WFL Pudding Teaspoon -- Pudding Cup -- Honey Teaspoon -- Honey Cup -- Nectar Teaspoon -- Nectar Cup -- Nectar Straw -- Thin Teaspoon -- Thin Cup -- Thin Straw -- Puree -- Mechanical Soft -- Regular -- Multi-consistency -- Pill -- Cervical Esophageal Comment -- Macario Golds 12/14/2018, 3:28 PM  Luanna Salk, MS [facility] SLP Acute Rehab Services Pager [phone] Office [phone]             [place] Ekg Site Rite  Result Date: 12/16/2018 If Site Rite image not attached, placement could not be confirmed due to current cardiac rhythm.   Scheduled Meds: . acyclovir  400 mg Oral Daily  . aspirin  81 mg Oral Daily  . budesonide (PULMICORT) nebulizer solution  0.5 mg Nebulization BID  . chlorhexidine  15 mL Mouth Rinse BID  . Chlorhexidine Gluconate Cloth  6 each Topical Daily  . enoxaparin (LOVENOX) injection  30 mg Subcutaneous Q24H  . feeding supplement (ENSURE ENLIVE)  237 mL Oral BID BM  . insulin aspart  0-9 Units Subcutaneous TID WC  . ipratropium-albuterol  3 mL Nebulization TID  . mouth rinse  15 mL Mouth Rinse q12n4p  . multivitamin  15 mL  Oral Daily  . pantoprazole sodium  40 mg Oral Daily  . predniSONE  60 mg Oral BID WC   Continuous Infusions: . sodium chloride 250 mL (12/15/18 2204)  .  ceFAZolin (ANCEF) IV 2 g (12/16/18 0604)     LOS: 9 days   Marylu Lund, MD [facility] Pager On Amion  If 7PM-7AM, please contact night-coverage 12/16/2018, 1:39 PM

## 2018-12-17 ENCOUNTER — Telehealth: Payer: Self-pay | Admitting: Hematology

## 2018-12-17 ENCOUNTER — Encounter: Payer: Self-pay | Admitting: General Practice

## 2018-12-17 ENCOUNTER — Other Ambulatory Visit: Payer: Self-pay | Admitting: Hematology

## 2018-12-17 ENCOUNTER — Telehealth: Payer: Self-pay | Admitting: *Deleted

## 2018-12-17 DIAGNOSIS — Z452 Encounter for adjustment and management of vascular access device: Secondary | ICD-10-CM

## 2018-12-17 LAB — GLUCOSE, CAPILLARY
Glucose-Capillary: 106 mg/dL — ABNORMAL HIGH (ref 70–99)
Glucose-Capillary: 139 mg/dL — ABNORMAL HIGH (ref 70–99)
Glucose-Capillary: 193 mg/dL — ABNORMAL HIGH (ref 70–99)
Glucose-Capillary: 192 mg/dL — ABNORMAL HIGH (ref 70–99)

## 2018-12-17 NOTE — Progress Notes (Signed)
Physical Therapy Treatment Patient Details Name: [name] [name] MRN: [id number] DOB: 1961-04-21 Today's Date: 12/17/2018    History of Present Illness Pt adm to [facility] on 12/06/18 with difficulty breathing. Pt with medical diagnoses of sepsis, bacteremia due to methicillin suspectible staphylococcus aureus, R lower lobe PNA. PMH includes bronchiectasis, COPD, Alpha 1 MZ, multiple myeloma, HTN.  He required intubation from 2/5-12/13/2018.      PT Comments    Pt with improved ambulation distance this session, required 1-2 standing rest breaks to recover sats >90%. Pt with improving LE strength and steadiness both in static and dynamic sitting and standing. PT updated d/c recommendation to reflect HHPT with assist for mobility, given pt's progress. Pt continues to be highly motivated to return to PLOF. Will continue to follow acutely.    Follow Up Recommendations  Home health PT;Supervision for mobility/OOB     Equipment Recommendations  Rolling walker with 5" wheels    Recommendations for Other Services       Precautions / Restrictions Precautions Precautions: Fall Precaution Comments: Pt on 2-3L O2 via [place]  Restrictions Weight Bearing Restrictions: No    Mobility  Bed Mobility Overal bed mobility: Needs Assistance Bed Mobility: Supine to Sit     Supine to sit: HOB elevated;Supervision     General bed mobility comments: Supervision for safety. Increased time and effort. Pt reporting momentary dizziness upon first sitting up, passed with time.   Transfers Overall transfer level: Needs assistance Equipment used: Rolling walker (2 wheeled) Transfers: Sit to/from Stand Sit to Stand: Min assist         General transfer comment: Min assist for steadying upon standing, increased time and effort to stand.   Ambulation/Gait Ambulation/Gait assistance: Min guard;Supervision Gait Distance (Feet): 200 Feet(2x100 ft) Assistive device: Rolling walker (2 wheeled) Gait Pattern/deviations:  Step-through pattern;Decreased stride length;Narrow base of support Gait velocity: slightly   General Gait Details: Min guard to supervision for safety. Pt initially on 2LO2 and satting in low 90s, but dropped to 86%. Pt placed on 3LO2 to address O2 demand, and pt instructed in breathing techniques (in through nose, out through mouth). 1 standing rest break to recover sats and for fatigue.   Stairs             Wheelchair Mobility    Modified Rankin (Stroke Patients Only)       Balance Overall balance assessment: Needs assistance Sitting-balance support: Bilateral upper extremity supported Sitting balance-Leahy Scale: Good     Standing balance support: Bilateral upper extremity supported Standing balance-Leahy Scale: Poor Standing balance comment: relies on RW for external support                             Cognition Arousal/Alertness: Awake/alert Behavior During Therapy: WFL for tasks assessed/performed Overall Cognitive Status: Within Functional Limits for tasks assessed                                        Exercises General Exercises - Lower Extremity Long Arc Quad: AROM;Both;Seated;10 reps Hip Flexion/Marching: AROM;Both;15 reps;Seated Other Exercises Other Exercises: Manual calf stretch with knee extended, 1x30 seconds bilaterally Other Exercises: Pt instructed in manual calf stretch with use of gait belt looped around foot, pt understands.     General Comments General comments (skin integrity, edema, etc.): Sats 86-98% on 2-3L O2 via [place], HR up to  119 bpm with exertion.       Pertinent Vitals/Pain Pain Assessment: Faces Faces Pain Scale: Hurts a little bit Pain Location: calves feeling tight  Pain Descriptors / Indicators: Tightness Pain Intervention(s): Limited activity within patient's tolerance;Monitored during session    Home Living                      Prior Function            PT Goals (current goals can  now be found in the care plan section) Acute Rehab PT Goals Patient Stated Goal: get stronger, return to PLOF  PT Goal Formulation: With patient Time For Goal Achievement: 12/29/18 Potential to Achieve Goals: Good Progress towards PT goals: Progressing toward goals    Frequency    Min 3X/week(for mobility progression )      PT Plan Discharge plan needs to be updated    Co-evaluation              AM-PAC PT "6 Clicks" Mobility   Outcome Measure  Help needed turning from your back to your side while in a flat bed without using bedrails?: A Little Help needed moving from lying on your back to sitting on the side of a flat bed without using bedrails?: A Little Help needed moving to and from a bed to a chair (including a wheelchair)?: A Little Help needed standing up from a chair using your arms (e.g., wheelchair or bedside chair)?: A Little Help needed to walk in hospital room?: A Little Help needed climbing 3-5 steps with a railing? : A Little 6 Click Score: 18    End of Session Equipment Utilized During Treatment: Gait belt;Oxygen Activity Tolerance: Patient limited by fatigue Patient left: with call bell/phone within reach;in bed;with family/visitor present(translator present, pt sitting EOB and will not get up without assistance) Nurse Communication: Mobility status PT Visit Diagnosis: Muscle weakness (generalized) (M62.81);Other abnormalities of gait and mobility (R26.89)     Time: 7703-4035 PT Time Calculation (min) (ACUTE ONLY): 16 min  Charges:  $Gait Training: 8-22 mins                     Julien Girt, PT Acute Rehabilitation Services Pager [phone]  Office [phone]    [name] [name] 12/17/2018, 3:05 PM

## 2018-12-17 NOTE — Progress Notes (Signed)
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 Admitted with dyspnea, cough, hemoptysis and chest x-ray evidence for R HCAP.Respiratory culture negative, blood culture MSSA.  Had to be transferred to the ICU and was intubated.  Subsequently extubated.  Had a brief run of A. fib.  Assessment & Plan:   Principal Problem:   Bacteremia due to methicillin susceptible Staphylococcus aureus (MSSA) Active Problems:   Pleural effusion, left   COPD GOLD IV criteria but never smoked    Multiple myeloma (HCC)   Acute on chronic respiratory failure with hypoxia (HCC)   Grade II diastolic dysfunction   Community acquired pneumonia   Protein-calorie malnutrition, severe   Sepsis ([place])   Alpha-1-antitrypsin deficiency ([place])   Bronchiectasis ([place])   Acute respiratory distress  Acute on chronic respiratory failure with hypoxia This is most likely due to the pneumonia.  Patient had to be intubated.  He was successfully extubated on 2/9.  Seems to be stable from a respiratory standpoint.  Pulmonology has been following. Wean O2 as tolerated   Healthcare associated pneumonia involving the right lower lobe, presumed MSSA Patient has a history of bronchiectasis.  He has been immunosuppressed due to chemotherapy.  No evidence for invasive aspergillosis per pulmonology.  He does however have a history of colonization.  Patient currently on cefazolin and acyclovir.  ID is following.  Pt seen by speech  MSSA bacteremia/septic shock He has been off of pressors.  Vital signs are stable.  Sepsis physiology has resolved.  Echocardiogram did not show any endocarditis.  Antibiotics as per infectious disease.  As of now plan is for at least 3 weeks of IV cefazolin.  PICC placed 2/12.  Paroxysmal atrial  fibrillation Patient had a brief run of on 2/8.  He converted back to sinus rhythm spontaneously.  Echocardiogram report as above.  TSH noted to be 0.20.  Free T4 normal at 1.16.  Atrial fibrillation occurred in the setting of sepsis and infection.  Has not had any further episodes.  Remains in sinus rhythm.  If he does not have any further episodes there is no need to initiate anticoagulation. CHADS-VASc noted to be 0  Abdominal pain with loose stools Hold laxatives.  Abdominal film did not show any acute findings.  Suspect pain related to his loose stools.  Continue to monitor for now.    History of bronchiectasis and COPD He has a history of alpha-1 antitrypsin deficiency.  He is followed by pulmonology in the outpatient setting.  He is on steroids currently.  Currently on Pulmicort and nebulizer treatments.  weaned to PO steroids  Chronic left hydropneumothorax He has had VATS procedure remotely for empyema.  Per pulmonology there is no indication of any active process in that area. Currently stable  History of multiple myeloma Revlimid remains on hold.  Acyclovir has been continued.  Followed by Dr. Irene Limbo.  Mild hypernatremia Sodium reviewed,currently within normal limits  Microcytic anemia Hemoglobin stable.  No evidence of overt bleeding. Hemodynamically stable  DVT prophylaxis: Lovenox subQ Code Status: Full Family Communication: Pt in room, family not at bedside Disposition Plan: Uncertain at this time  Consultants:   PCCM  ID  Procedures:  Right internal jugular triple-lumen catheter placed on 2/5 Was intubated from 2/5 to 2/9.  Transthoracic echocardiogram IMPRESSIONS 1. The  left ventricle has normal systolic function of 70-92%. The cavity size is normal. There is no increased left ventricular wall thickness. Echo evidence of normal diastolic filling patterns.The left ventricular diastology could not be evaluated due to nondiagnostic images. 2. The right  ventricle is normal in size. There is no increase in right ventricular wall thickness. There is moderately reduced systolic function. Right ventricular systolic pressure is severely elevated with an estimated pressure of 79.4 mmHg. 3. Normal left atrial size. 4. Mildly dilated right atrial size. 5. The aortic valve is tricuspid in structure. There is mild sclerosis of the aortic valve. 6. The anterior mitral valve leaflet is elongated with no obvious prolapse. There is trivial mitral regurgitation. 7. The tricuspid valve is normal in structure. Regurgitation is moderate. 8. Pulmonic valve regurgitation is mild to moderate is mild by color flow Doppler. 9. There is a trivial pericardial effusion anterior to the right ventricle. 10. In comparison to the previous echocardiogram(s): The right ventricular systolic function is worse.  Antimicrobials: Anti-infectives (From admission, onward)   Start     Dose/Rate Route Frequency Ordered Stop   12/14/18 1600  acyclovir (ZOVIRAX) tablet 400 mg     400 mg Oral Daily 12/14/18 1533     12/11/18 1000  acyclovir (ZOVIRAX) 200 MG/5ML suspension SUSP 400 mg  Status:  Discontinued     400 mg Oral Daily 12/10/18 1410 12/14/18 1527   12/08/18 0100  vancomycin (VANCOCIN) IVPB 750 mg/150 ml premix  Status:  Discontinued     750 mg 150 mL/hr over 60 Minutes Intravenous Every 36 hours 12/06/18 1441 12/07/18 0919   12/07/18 2200  ceFAZolin (ANCEF) IVPB 2g/100 mL premix     2 g 200 mL/hr over 30 Minutes Intravenous Every 8 hours 12/07/18 1828     12/07/18 0100  ceFEPIme (MAXIPIME) 1 g in sodium chloride 0.9 % 100 mL IVPB  Status:  Discontinued     1 g 200 mL/hr over 30 Minutes Intravenous Every 12 hours 12/06/18 1441 12/07/18 1812   12/06/18 1830  acyclovir (ZOVIRAX) tablet 400 mg  Status:  Discontinued     400 mg Oral Daily 12/06/18 1757 12/10/18 1410   12/06/18 1145  vancomycin (VANCOCIN) IVPB 750 mg/150 ml premix     750 mg 150 mL/hr over 60 Minutes  Intravenous STAT 12/06/18 1136 12/06/18 1402   12/06/18 1130  ceFEPIme (MAXIPIME) 1 g in sodium chloride 0.9 % 100 mL IVPB     1 g 200 mL/hr over 30 Minutes Intravenous  Once 12/06/18 1129 12/06/18 1256      Subjective: No complaints currently  Objective: Vitals:   12/17/18 0527 12/17/18 0832 12/17/18 1226 12/17/18 1401  BP: 136/82  (!) 143/76   Pulse: 72  85   Resp: 18  20   Temp: 97.9 F (36.6 C)  98.6 F (37 C)   TempSrc: Oral  Oral   SpO2: 100% 99% 100% 100%  Weight:      Height:        Intake/Output Summary (Last 24 hours) at 12/17/2018 1840 Last data filed at 12/17/2018 1642 Gross per 24 hour  Intake 1080 ml  Output 1750 ml  Net -670 ml   Filed Weights   12/13/18 0452 12/14/18 0500 12/15/18 0500  Weight: 45.6 kg 42 kg 41.2 kg    Examination: General exam: Awake, laying in bed, in nad Respiratory system: Normal respiratory effort, no wheezing  Data Reviewed: I have personally reviewed following labs and imaging studies  CBC:  Recent Labs  Lab 12/12/18 0400 12/13/18 0500 12/14/18 0506 12/15/18 0515 12/16/18 0322  WBC 5.0 8.4 7.4 5.9 5.5  HGB 8.7* 8.5* 8.5* 8.4* 8.3*  HCT 27.3* 26.6* 27.2* 27.1* 27.0*  MCV 77.8* 78.0* 79.3* 79.9* 79.2*  PLT 98* 117* 177 214 412   Basic Metabolic Panel: Recent Labs  Lab 12/11/18 0330 12/12/18 0400 12/13/18 0500 12/14/18 0506 12/15/18 0515 12/16/18 0322  NA 143 143 147* 148* 141 140  K 4.4 4.0 3.2* 3.6 3.5 3.6  CL 114* 111 105 108 104 102  CO2 26 29 37* 34* 31 32  GLUCOSE 164* 175* 154* 119* 132* 139*  BUN 35* 34* 34* 30* 23* 17  CREATININE 0.87 0.66 0.69 0.65 0.59* 0.53*  CALCIUM 6.6* 6.3* 6.3* 6.5* 6.5* 6.6*  MG 2.1 2.0 1.8 2.0  --   --   PHOS 1.7* 2.4* 2.1* 2.1*  --   --    GFR: Estimated Creatinine Clearance: 60.1 mL/min (A) (by C-G formula based on SCr of 0.53 mg/dL (L)). Liver Function Tests: No results for input(s): AST, ALT, ALKPHOS, BILITOT, PROT, ALBUMIN in the last 168 hours. No results for  input(s): LIPASE, AMYLASE in the last 168 hours. No results for input(s): AMMONIA in the last 168 hours. Coagulation Profile: No results for input(s): INR, PROTIME in the last 168 hours. Cardiac Enzymes: No results for input(s): CKTOTAL, CKMB, CKMBINDEX, TROPONINI in the last 168 hours. BNP (last 3 results) No results for input(s): PROBNP in the last 8760 hours. HbA1C: No results for input(s): HGBA1C in the last 72 hours. CBG: Recent Labs  Lab 12/16/18 1654 12/16/18 2323 12/17/18 0744 12/17/18 1129 12/17/18 1624  GLUCAP 184* 249* 106* 139* 193*   Lipid Profile: No results for input(s): CHOL, HDL, LDLCALC, TRIG, CHOLHDL, LDLDIRECT in the last 72 hours. Thyroid Function Tests: Recent Labs    12/15/18 0515  TSH 0.209*  FREET4 1.16   Anemia Panel: No results for input(s): VITAMINB12, FOLATE, FERRITIN, TIBC, IRON, RETICCTPCT in the last 72 hours. Sepsis Labs: No results for input(s): PROCALCITON, LATICACIDVEN in the last 168 hours.  Recent Results (from the past 240 hour(s))  Aspergillus Ag, BAL/Serum     Status: Abnormal   Collection Time: 12/09/18  5:12 PM  Result Value Ref Range Status   Aspergillus Ag, BAL/Serum 1.75 (H) 0.00 - 0.49 Index Final    Comment: (NOTE) Performed At: [facility] [address] [place], [place] [id number] Rush Farmer MD NO:[id number] Performed At: [facility] [place], [place] [id number] Nechama Guard MD PT:[id number]   Culture, respiratory     Status: None   Collection Time: 12/10/18 11:20 AM  Result Value Ref Range Status   Specimen Description   Final    TRACHEAL ASPIRATE Performed at [facility] [address] [place], [place] [address]    Special Requests   Final    NONE Performed at [facility], [place] [address] [place], [place] [address]    Gram Stain   Final    RARE WBC PRESENT, PREDOMINANTLY PMN FEW GRAM POSITIVE COCCI IN CLUSTERS Performed at  [facility], [place] [address], [place], [place] [address]    Culture RARE STAPHYLOCOCCUS AUREUS  Final   Report Status 12/12/2018 FINAL  Final   Organism ID, Bacteria STAPHYLOCOCCUS AUREUS  Final      Susceptibility   Staphylococcus aureus - MIC*    CIPROFLOXACIN <=0.5 SENSITIVE Sensitive     ERYTHROMYCIN >=8 RESISTANT  Resistant     GENTAMICIN <=0.5 SENSITIVE Sensitive     OXACILLIN 0.5 SENSITIVE Sensitive     TETRACYCLINE <=1 SENSITIVE Sensitive     VANCOMYCIN <=0.5 SENSITIVE Sensitive     TRIMETH/SULFA <=10 SENSITIVE Sensitive     CLINDAMYCIN <=0.25 SENSITIVE Sensitive     RIFAMPIN <=0.5 SENSITIVE Sensitive     Inducible Clindamycin NEGATIVE Sensitive     * RARE STAPHYLOCOCCUS AUREUS     Radiology Studies: [place] Ekg Site Rite  Result Date: 12/16/2018 If Site Rite image not attached, placement could not be confirmed due to current cardiac rhythm.   Scheduled Meds: . acyclovir  400 mg Oral Daily  . aspirin  81 mg Oral Daily  . budesonide (PULMICORT) nebulizer solution  0.5 mg Nebulization BID  . enoxaparin (LOVENOX) injection  30 mg Subcutaneous Q24H  . feeding supplement (ENSURE ENLIVE)  237 mL Oral BID BM  . insulin aspart  0-9 Units Subcutaneous TID WC  . ipratropium-albuterol  3 mL Nebulization TID  . mouth rinse  15 mL Mouth Rinse q12n4p  . multivitamin  15 mL Oral Daily  . pantoprazole sodium  40 mg Oral Daily  . predniSONE  60 mg Oral BID WC   Continuous Infusions: . sodium chloride 250 mL (12/16/18 1404)  .  ceFAZolin (ANCEF) IV 2 g (12/17/18 1443)     LOS: 10 days   Marylu Lund, MD [facility] Pager On Amion  If 7PM-7AM, please contact night-coverage 12/17/2018, 6:40 PM

## 2018-12-17 NOTE — Progress Notes (Signed)
[place] CSW Progress Notes  CSW received call from inpatient CSW Joelene Millin [phone]) - seeking SNF placement for patient as post acute discharge plan.  Wanted information on oncology drugs used to treat patient and ability to access these while in SNF.  Per Emeline Darling [phone]), patient receives Revlimid/lenolidamide from manufacturer at 100% discount, 30 day supply is mailed to patient's home address and can be switched to mail to SNF, last fill was 11/18/18.  Patient may have some medication left at home if he did not bring to inpatient.  Patient also receives Velcade via infusion.  Theotis Burrow [phone]) assists w infusion drugs - states that patient was eligible for manufacturer assistance for this drug in 2019; however, when applied for 2020 assistance patient was no longer eligible as he did not meet coverage guidelines for assistance.  CSW Vernona Rieger advised to ask inpatient financial advocates to assess patient current insurance coverage.  Patient can be transported to/from [facility] appointments via [facility] - please advise of placement location Ginette Otto [phone] Coordinator).  CSW Vernona Rieger advised of the above information; MD informed of post discharge plan.    Edwyna Shell, LCSW Clinical Social Worker Phone:  [phone]

## 2018-12-17 NOTE — Progress Notes (Deleted)
[facility] CSW Progress  Notes  [place] CSW Progress Notes  CSW received call from inpatient CSW Joelene Millin [phone]) - seeking SNF placement under LOG for post discharge planning.  Facility concerned about cost of cancer treatment related drugs, wanted information on how oncology drugs are being paid for uninsured patient.  Per [facility] oral chemo rep Emeline Darling [phone]) patient receives 100% patient assistance for Revlimid/lenolidamide.  Medication is mailed in 30 day supply to patient's home.  This can be delivered to SNF, last fill was 1/115/20.  Evelena Peat can assist w getting next fill ordered.  Patient also receives Velcade infusion - per Valentina Lucks - infusion drug specialist - [phone]) - patient no longer receives manufacturer provided assistance for Velcade because patient does not qualify for manufacturer assistance at this time.  CSW Vernona Rieger will work w inpatient financial rep to clarify patient status.  [facility] program is available to assist w transport to/from [facility] appointments - scheduler Ginette Otto [phone]) can assist; please let [place] know of his placement address.  Above information conveyed to [place].  Edwyna Shell, LCSW Clinical Social Worker Phone:  [phone]

## 2018-12-17 NOTE — Care Management Note (Signed)
Case Management Note  Patient Details  Name: [name] [name] MRN: [id number] Date of Birth: 04-25-1961  Subjective/Objective:                    Action/Plan:Discharge to SNF for IV ABX.   Expected Discharge Date:  12/08/18               Expected Discharge Plan:  Skilled Nursing Facility  In-House Referral:  Clinical Social Work  Discharge planning Services  CM Consult  Post Acute Care Choice:    Choice offered to:     DME Arranged:    DME Agency:     HH Arranged:    [place] Agency:     Status of Service:  Completed, signed off  If discussed at [employer], dates discussed:    Additional CommentsPurcell Mouton, RN 12/17/2018, 3:20 PM

## 2018-12-17 NOTE — Telephone Encounter (Signed)
Scheduled appt per 2/13 sch message - pt to get appt with discharge papers.

## 2018-12-17 NOTE — NC FL2 (Signed)
[place] LEVEL OF CARE SCREENING TOOL     IDENTIFICATION  Patient Name: [name] [name] Birthdate: 1961-04-03 Sex: male Admission Date (Current Location): 12/06/2018  [facility] and [place] Number:  Herbalist and Address:  [facility],  [place], [place]      Provider Number: [id number]  Attending Physician Name and Address:  Donne Hazel, MD  Relative Name and Phone Number:       Current Level of Care: Hospital Recommended Level of Care: [place] Prior Approval Number:    Date Approved/Denied:   PASRR Number: Pending.   Discharge Plan: SNF    Current Diagnoses: Patient Active Problem List   Diagnosis Date Noted  . Acute respiratory distress   . Alpha-1-antitrypsin deficiency ([place]) 12/07/2018  . Bronchiectasis ([place]) 12/07/2018  . Bacteremia due to methicillin susceptible Staphylococcus aureus (MSSA) 12/07/2018  . Sepsis ([place]) 12/06/2018  . Protein-calorie malnutrition, severe 09/05/2018  . Community acquired pneumonia 09/03/2018  . Grade II diastolic dysfunction 54/00/8676  . Acute on chronic respiratory failure with hypoxia ([place]) 01/27/2018  . Counseling regarding advanced care planning and goals of care 11/08/2017  . Multiple myeloma ([place]) 11/07/2017  . Chronic respiratory failure with hypoxia ([place]) 03/26/2016  . COPD GOLD IV criteria but never smoked  03/11/2016  . Pulmonary HTN ([place])   . Pleural effusion, left 09/26/2015  . Viral hepatitis C 08/06/2015    Orientation RESPIRATION BLADDER Height & Weight     Self, Time, Situation, Place  O2(3 liters Nasal Cannula ) Continent Weight: 90 lb 13.3 oz (41.2 kg) Height:  5' (152.4 cm)  BEHAVIORAL SYMPTOMS/MOOD NEUROLOGICAL BOWEL NUTRITION STATUS      Continent Diet(Regular Diet)  AMBULATORY STATUS COMMUNICATION OF NEEDS Skin   Extensive Assist Verbally Normal                       Personal Care Assistance Level of Assistance  Bathing,  Feeding, Dressing Bathing Assistance: Maximum assistance Feeding assistance: Independent Dressing Assistance: Maximum assistance     Functional Limitations Info  Sight, Hearing, Speech Sight Info: Adequate Hearing Info: Adequate Speech Info: Adequate    SPECIAL CARE FACTORS FREQUENCY  PT (By licensed PT), OT (By licensed OT)     PT Frequency: 5x/week  OT Frequency: 5x/week            Contractures Contractures Info: Not present    Additional Factors Info  Allergies Code Status Info: Fullcode             Current Medications (12/17/2018):  This is the current hospital active medication list Current Facility-Administered Medications  Medication Dose Route Frequency Provider Last Rate Last Dose  . 0.9 %  sodium chloride infusion   Intravenous PRN Bonnielee Haff, MD 10 mL/hr at 12/16/18 1404 250 mL at 12/16/18 1404  . acetaminophen (TYLENOL) solution 650 mg  650 mg Oral Q6H PRN Bonnielee Haff, MD       Or  . acetaminophen (TYLENOL) suppository 650 mg  650 mg Rectal Q4H PRN Bonnielee Haff, MD      . acyclovir (ZOVIRAX) tablet 400 mg  400 mg Oral Daily Bonnielee Haff, MD   400 mg at 12/17/18 0953  . albuterol (PROVENTIL) (2.5 MG/3ML) 0.083% nebulizer solution 2.5 mg  2.5 mg Nebulization Q4H PRN Bonnielee Haff, MD      . aspirin chewable tablet 81 mg  81 mg Oral Daily Bonnielee Haff, MD   81 mg at 12/17/18  1561  . budesonide (PULMICORT) nebulizer solution 0.5 mg  0.5 mg Nebulization BID Bonnielee Haff, MD   0.5 mg at 12/17/18 5379  . ceFAZolin (ANCEF) IVPB 2g/100 mL premix  2 g Intravenous Q8H Bonnielee Haff, MD 200 mL/hr at 12/17/18 0537 2 g at 12/17/18 0537  . chlorhexidine (PERIDEX) 0.12 % solution 15 mL  15 mL Mouth Rinse BID Bonnielee Haff, MD   15 mL at 12/17/18 0955  . enoxaparin (LOVENOX) injection 30 mg  30 mg Subcutaneous Q24H Bonnielee Haff, MD   30 mg at 12/16/18 2125  . feeding supplement (ENSURE ENLIVE) (ENSURE ENLIVE) liquid 237 mL  237 mL Oral BID BM  Donne Hazel, MD   237 mL at 12/17/18 1022  . guaiFENesin (ROBITUSSIN) 100 MG/5ML solution 100 mg  5 mL Oral Q4H PRN Bonnielee Haff, MD      . hydrALAZINE (APRESOLINE) injection 10 mg  10 mg Intravenous Q6H PRN Bonnielee Haff, MD      . insulin aspart (novoLOG) injection 0-9 Units  0-9 Units Subcutaneous TID WC Bonnielee Haff, MD   1 Units at 12/17/18 1233  . ipratropium-albuterol (DUONEB) 0.5-2.5 (3) MG/3ML nebulizer solution 3 mL  3 mL Nebulization TID Donne Hazel, MD   3 mL at 12/17/18 4327  . MEDLINE mouth rinse  15 mL Mouth Rinse q12n4p Bonnielee Haff, MD   15 mL at 12/16/18 1744  . multivitamin liquid 15 mL  15 mL Oral Daily Bonnielee Haff, MD   15 mL at 12/17/18 0955  . ondansetron (ZOFRAN) tablet 4 mg  4 mg Oral Q6H PRN Bonnielee Haff, MD       Or  . ondansetron [facility]) injection 4 mg  4 mg Intravenous Q6H PRN Bonnielee Haff, MD      . oxyCODONE-acetaminophen (PERCOCET/ROXICET) 5-325 MG per tablet 1 tablet  1 tablet Oral Q6H PRN Bonnielee Haff, MD      . pantoprazole sodium (PROTONIX) 40 mg/20 mL oral suspension 40 mg  40 mg Oral Daily Bonnielee Haff, MD   40 mg at 12/17/18 1028  . predniSONE (DELTASONE) tablet 60 mg  60 mg Oral BID WC Bonnielee Haff, MD   60 mg at 12/17/18 0900  . sodium chloride flush (NS) 0.9 % injection 10-40 mL  10-40 mL Intracatheter PRN Bonnielee Haff, MD      . sodium chloride flush (NS) 0.9 % injection 10-40 mL  10-40 mL Intracatheter PRN Donne Hazel, MD         Discharge Medications: Please see discharge summary for a list of discharge medications.  Relevant Imaging Results:  Relevant Lab Results:   Additional Information [id number]  Lia Hopping, LCSW

## 2018-12-17 NOTE — Clinical Social Work Note (Signed)
Clinical Social Work Assessment  Patient Details  Name: [name] [name] MRN: [id number] Date of Birth: Jun 05, 1961  Date of referral:  12/16/18               Reason for consult:                   Permission sought to share information with:  Chartered certified accountant granted to share information::  Yes, Verbal Permission Granted  Name::        Agency::  [facility]   Relationship::     Contact Information:     Housing/Transportation Living arrangements for the past 2 months:  [place] of Information:  Patient Patient Interpreter Needed:  Other (Comment Required)([name] [name]) Criminal Activity/Legal Involvement Pertinent to Current Situation/Hospitalization:  No - Comment as needed Significant Relationships:  Other Family Members Lives with:  Other (Comment)(neice) Do you feel safe going back to the place where you live?  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 Admitted with dyspnea, cough, hemoptysis and chest x-ray evidence for R HCAP.Respiratory culture negative, blood culture MSSA. Had to be transferred to the ICU and was intubated. Subsequently extubated. Had a brief run of A. Fib.  Patient is deconditioned and weak.  Patient will need ongoing IV antibiotics for the next six weeks.  Patient does not have insurance.  CSW discussed with CSW leadership. CSW actively searching for placement.    Social Worker assessment / plan:  CSW met with the patient and [name] [name] interpreter at bedside. Patient receptive to talk with CSW. Per the patient he lives in the home with his niece and her five children under the age of 63. The patient has been receiving chemotherapy for the past year at the [facility]. Patient reports he has not been able to work due  to his ongoing treatments. Patient reports he does not receive any benefits and has not applied.  CSW reached out to SNF's in the area that accept Letter of Guarantee.    CSW reached out to the Edwyna Shell the Oncology social worker to clarify patient medications (See progress note)  CSW reached out to [employer] center, the patient medicaid is pending and he does not have any benefits. He has pending documents that need to be submitted to the Department of Social Services.   Plan: to be determine.   Employment status:  Unemployed [name] scientist:  Self Pay (Medicaid Pending) PT Recommendations:  [place] / Referral to community resources:  [place]  Patient/Family's Response to care:  Agreeable and Responding well to care.   Patient/Family's Understanding of and Emotional Response to Diagnosis, Current Treatment, and Prognosis:  Patient   Emotional Assessment Appearance:  Appears stated age Attitude/Demeanor/Rapport:    Affect (typically observed):  Accepting Orientation:  Oriented to Self, Oriented to Place, Oriented to  Time, Oriented to Situation Alcohol / Substance use:  Not Applicable Psych involvement (Current and /or in the community):  No (Comment)  Discharge Needs  Concerns to be addressed:  Discharge Planning Concerns Readmission within the last 30 days:  No Current discharge risk:  Dependent with Mobility, Inadequate Financial Supports Barriers to Discharge:  Continued Medical Work up, No SNF bed, Inadequate or no insurance   [name] [name], [place] 12/17/2018, 1:03 PM

## 2018-12-18 ENCOUNTER — Encounter: Payer: Self-pay | Admitting: General Practice

## 2018-12-18 LAB — GLUCOSE, CAPILLARY
Glucose-Capillary: 124 mg/dL — ABNORMAL HIGH (ref 70–99)
Glucose-Capillary: 124 mg/dL — ABNORMAL HIGH (ref 70–99)
Glucose-Capillary: 221 mg/dL — ABNORMAL HIGH (ref 70–99)
Glucose-Capillary: 184 mg/dL — ABNORMAL HIGH (ref 70–99)

## 2018-12-18 MED ORDER — CEFAZOLIN IV (FOR PTA / DISCHARGE USE ONLY): 2.0000 g | Freq: Three times a day (TID) | INTRAVENOUS | 0 refills | Status: DC

## 2018-12-18 NOTE — Progress Notes (Signed)
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 Admitted with dyspnea, cough, hemoptysis and chest x-ray evidence for R HCAP.Respiratory culture negative, blood culture MSSA.  Had to be transferred to the ICU and was intubated.  Subsequently extubated.  Had a brief run of A. fib.  Assessment & Plan:   Principal Problem:   Bacteremia due to methicillin susceptible Staphylococcus aureus (MSSA) Active Problems:   Pleural effusion, left   COPD GOLD IV criteria but never smoked    Multiple myeloma (HCC)   Acute on chronic respiratory failure with hypoxia (HCC)   Grade II diastolic dysfunction   Community acquired pneumonia   Protein-calorie malnutrition, severe   Sepsis ([place])   Alpha-1-antitrypsin deficiency ([place])   Bronchiectasis ([place])   Acute respiratory distress  Acute on chronic respiratory failure with hypoxia This is most likely due to the pneumonia.  Patient had to be intubated.  He was successfully extubated on 2/9.  Seems to be stable from a respiratory standpoint.  Pulmonology has been following. O2 weaned to baseline 2-3LNC  Healthcare associated pneumonia involving the right lower lobe, presumed MSSA Patient has a history of bronchiectasis.  He has been immunosuppressed due to chemotherapy.  No evidence for invasive aspergillosis per pulmonology.  He does however have a history of colonization.  Patient currently on cefazolin and acyclovir.  ID is following.  Pt seen by speech  MSSA bacteremia/septic shock He has been off of pressors.  Vital signs are stable.  Sepsis physiology has resolved.  Echocardiogram did not show any endocarditis.  Antibiotics as per infectious disease.  As of now plan is for at least 3 weeks of IV cefazolin.  PICC placed 2/12. Planning home  abx with home health  Paroxysmal atrial fibrillation Patient had a brief run of on 2/8.  He converted back to sinus rhythm spontaneously.  Echocardiogram report as above.  TSH noted to be 0.20.  Free T4 normal at 1.16.  Atrial fibrillation occurred in the setting of sepsis and infection.  Has not had any further episodes.  Remains in sinus rhythm.  If he does not have any further episodes there is no need to initiate anticoagulation. CHADS-VASc noted to be 0  Abdominal pain with loose stools Hold laxatives.  Abdominal film did not show any acute findings.  Suspect pain related to his loose stools.  Continue to monitor for now.    History of bronchiectasis and COPD He has a history of alpha-1 antitrypsin deficiency.  He is followed by pulmonology in the outpatient setting.  He is on steroids currently.  Currently on Pulmicort and nebulizer treatments.  weaned to PO steroids  Chronic left hydropneumothorax He has had VATS procedure remotely for empyema.  Per pulmonology there is no indication of any active process in that area. Currently stable  History of multiple myeloma Revlimid remains on hold.  Acyclovir has been continued.  Followed by Dr. Irene Limbo.  Mild hypernatremia Sodium reviewed,currently within normal limits  Microcytic anemia Hemoglobin stable.  No evidence of overt bleeding. Remains hemodynamically stable  DVT prophylaxis: Lovenox subQ Code Status: Full Family Communication: Pt in room, family not at bedside Disposition Plan: anticipate home in 24hrs  Consultants:   PCCM  ID  Procedures:  Right internal jugular triple-lumen catheter placed on 2/5 Was intubated from 2/5 to  2/9.  Transthoracic echocardiogram IMPRESSIONS 1. The left ventricle has normal systolic function of 17-40%. The cavity size is normal. There is no increased left ventricular wall thickness. Echo evidence of normal diastolic filling patterns.The left ventricular diastology could not be  evaluated due to nondiagnostic images. 2. The right ventricle is normal in size. There is no increase in right ventricular wall thickness. There is moderately reduced systolic function. Right ventricular systolic pressure is severely elevated with an estimated pressure of 79.4 mmHg. 3. Normal left atrial size. 4. Mildly dilated right atrial size. 5. The aortic valve is tricuspid in structure. There is mild sclerosis of the aortic valve. 6. The anterior mitral valve leaflet is elongated with no obvious prolapse. There is trivial mitral regurgitation. 7. The tricuspid valve is normal in structure. Regurgitation is moderate. 8. Pulmonic valve regurgitation is mild to moderate is mild by color flow Doppler. 9. There is a trivial pericardial effusion anterior to the right ventricle. 10. In comparison to the previous echocardiogram(s): The right ventricular systolic function is worse.  Antimicrobials: Anti-infectives (From admission, onward)   Start     Dose/Rate Route Frequency Ordered Stop   12/18/18 0000  ceFAZolin (ANCEF) IVPB     2 g Intravenous Every 8 hours 12/18/18 1646 12/27/18 2359   12/14/18 1600  acyclovir (ZOVIRAX) tablet 400 mg     400 mg Oral Daily 12/14/18 1533     12/11/18 1000  acyclovir (ZOVIRAX) 200 MG/5ML suspension SUSP 400 mg  Status:  Discontinued     400 mg Oral Daily 12/10/18 1410 12/14/18 1527   12/08/18 0100  vancomycin (VANCOCIN) IVPB 750 mg/150 ml premix  Status:  Discontinued     750 mg 150 mL/hr over 60 Minutes Intravenous Every 36 hours 12/06/18 1441 12/07/18 0919   12/07/18 2200  ceFAZolin (ANCEF) IVPB 2g/100 mL premix     2 g 200 mL/hr over 30 Minutes Intravenous Every 8 hours 12/07/18 1828     12/07/18 0100  ceFEPIme (MAXIPIME) 1 g in sodium chloride 0.9 % 100 mL IVPB  Status:  Discontinued     1 g 200 mL/hr over 30 Minutes Intravenous Every 12 hours 12/06/18 1441 12/07/18 1812   12/06/18 1830  acyclovir (ZOVIRAX) tablet 400 mg  Status:   Discontinued     400 mg Oral Daily 12/06/18 1757 12/10/18 1410   12/06/18 1145  vancomycin (VANCOCIN) IVPB 750 mg/150 ml premix     750 mg 150 mL/hr over 60 Minutes Intravenous STAT 12/06/18 1136 12/06/18 1402   12/06/18 1130  ceFEPIme (MAXIPIME) 1 g in sodium chloride 0.9 % 100 mL IVPB     1 g 200 mL/hr over 30 Minutes Intravenous  Once 12/06/18 1129 12/06/18 1256      Subjective: Without complaints this AM  Objective: Vitals:   12/18/18 0421 12/18/18 0735 12/18/18 1222 12/18/18 1452  BP: 129/80  (!) 142/88   Pulse: 72  85   Resp: 16  (!) 22   Temp: 97.9 F (36.6 C)  98 F (36.7 C)   TempSrc: Oral  Oral   SpO2: 100% 99% 99% 95%  Weight:      Height:        Intake/Output Summary (Last 24 hours) at 12/18/2018 1742 Last data filed at 12/18/2018 1700 Gross per 24 hour  Intake 1206.14 ml  Output 1195 ml  Net 11.14 ml   Filed Weights   12/13/18 0452 12/14/18 0500 12/15/18 0500  Weight: 45.6 kg 42 kg 41.2 kg  Examination: General exam: Conversant, in no acute distress Respiratory system: normal chest rise, clear, no audible wheezing  Data Reviewed: I have personally reviewed following labs and imaging studies  CBC: Recent Labs  Lab 12/12/18 0400 12/13/18 0500 12/14/18 0506 12/15/18 0515 12/16/18 0322  WBC 5.0 8.4 7.4 5.9 5.5  HGB 8.7* 8.5* 8.5* 8.4* 8.3*  HCT 27.3* 26.6* 27.2* 27.1* 27.0*  MCV 77.8* 78.0* 79.3* 79.9* 79.2*  PLT 98* 117* 177 214 092   Basic Metabolic Panel: Recent Labs  Lab 12/12/18 0400 12/13/18 0500 12/14/18 0506 12/15/18 0515 12/16/18 0322  NA 143 147* 148* 141 140  K 4.0 3.2* 3.6 3.5 3.6  CL 111 105 108 104 102  CO2 29 37* 34* 31 32  GLUCOSE 175* 154* 119* 132* 139*  BUN 34* 34* 30* 23* 17  CREATININE 0.66 0.69 0.65 0.59* 0.53*  CALCIUM 6.3* 6.3* 6.5* 6.5* 6.6*  MG 2.0 1.8 2.0  --   --   PHOS 2.4* 2.1* 2.1*  --   --    GFR: Estimated Creatinine Clearance: 60.1 mL/min (A) (by C-G formula based on SCr of 0.53 mg/dL  (L)). Liver Function Tests: No results for input(s): AST, ALT, ALKPHOS, BILITOT, PROT, ALBUMIN in the last 168 hours. No results for input(s): LIPASE, AMYLASE in the last 168 hours. No results for input(s): AMMONIA in the last 168 hours. Coagulation Profile: No results for input(s): INR, PROTIME in the last 168 hours. Cardiac Enzymes: No results for input(s): CKTOTAL, CKMB, CKMBINDEX, TROPONINI in the last 168 hours. BNP (last 3 results) No results for input(s): PROBNP in the last 8760 hours. HbA1C: No results for input(s): HGBA1C in the last 72 hours. CBG: Recent Labs  Lab 12/17/18 1624 12/17/18 2103 12/18/18 0726 12/18/18 1124 12/18/18 1635  GLUCAP 193* 192* 124* 124* 221*   Lipid Profile: No results for input(s): CHOL, HDL, LDLCALC, TRIG, CHOLHDL, LDLDIRECT in the last 72 hours. Thyroid Function Tests: No results for input(s): TSH, T4TOTAL, FREET4, T3FREE, THYROIDAB in the last 72 hours. Anemia Panel: No results for input(s): VITAMINB12, FOLATE, FERRITIN, TIBC, IRON, RETICCTPCT in the last 72 hours. Sepsis Labs: No results for input(s): PROCALCITON, LATICACIDVEN in the last 168 hours.  Recent Results (from the past 240 hour(s))  Aspergillus Ag, BAL/Serum     Status: Abnormal   Collection Time: 12/09/18  5:12 PM  Result Value Ref Range Status   Aspergillus Ag, BAL/Serum 1.75 (H) 0.00 - 0.49 Index Final    Comment: (NOTE) Performed At: [facility] [address] [place], [place] [id number] Rush Farmer MD [id number] Performed At: [facility] [place], [place] [id number] Nechama Guard MD [id number]   Culture, respiratory     Status: None   Collection Time: 12/10/18 11:20 AM  Result Value Ref Range Status   Specimen Description   Final    TRACHEAL ASPIRATE Performed at [facility] [address], [place], [place] [address]    Special Requests   Final    NONE Performed at [facility],  [place] [address] [place], [place] [address]    Gram Stain   Final    RARE WBC PRESENT, PREDOMINANTLY PMN FEW GRAM POSITIVE COCCI IN CLUSTERS Performed at [facility], [place] [address], [place], [place] [address]    Culture RARE STAPHYLOCOCCUS AUREUS  Final   Report Status 12/12/2018 FINAL  Final   Organism ID, Bacteria STAPHYLOCOCCUS AUREUS  Final      Susceptibility  Staphylococcus aureus - MIC*    CIPROFLOXACIN <=0.5 SENSITIVE Sensitive     ERYTHROMYCIN >=8 RESISTANT Resistant     GENTAMICIN <=0.5 SENSITIVE Sensitive     OXACILLIN 0.5 SENSITIVE Sensitive     TETRACYCLINE <=1 SENSITIVE Sensitive     VANCOMYCIN <=0.5 SENSITIVE Sensitive     TRIMETH/SULFA <=10 SENSITIVE Sensitive     CLINDAMYCIN <=0.25 SENSITIVE Sensitive     RIFAMPIN <=0.5 SENSITIVE Sensitive     Inducible Clindamycin NEGATIVE Sensitive     * RARE STAPHYLOCOCCUS AUREUS     Radiology Studies: No results found.  Scheduled Meds: . acyclovir  400 mg Oral Daily  . aspirin  81 mg Oral Daily  . budesonide (PULMICORT) nebulizer solution  0.5 mg Nebulization BID  . enoxaparin (LOVENOX) injection  30 mg Subcutaneous Q24H  . feeding supplement (ENSURE ENLIVE)  237 mL Oral BID BM  . insulin aspart  0-9 Units Subcutaneous TID WC  . ipratropium-albuterol  3 mL Nebulization TID  . mouth rinse  15 mL Mouth Rinse q12n4p  . multivitamin  15 mL Oral Daily  . pantoprazole sodium  40 mg Oral Daily  . predniSONE  60 mg Oral BID WC   Continuous Infusions: . sodium chloride 250 mL (12/16/18 1404)  .  ceFAZolin (ANCEF) IV 2 g (12/18/18 1318)     LOS: 11 days   Marylu Lund, MD [facility] Pager On Amion  If 7PM-7AM, please contact night-coverage 12/18/2018, 5:42 PM

## 2018-12-18 NOTE — Progress Notes (Signed)
[place] CSW Progress Notes  Call from [employer] [facility], agency filed disability claim for patient on 11-24-18.  Agency received email this week from [employer] office, stating that patient is not eligible for SSI because SSA is already providing SSDI benefits to patient and is thus not eligible SSI benefits.  [place] case closed   Edwyna Shell, [place] Worker Phone:  [phone]

## 2018-12-18 NOTE — Progress Notes (Addendum)
Update: CSW met with the patient at beside with interpreter present. CSW discussed patient disposition plan. Patient reports he prefers to go home and complete his IV antibiotics. Per patient, he went home last with IV therapy and was able to clean work with home health. Patient progressed well with physical therapy yesterday and has requested a walker for home. CSW confirmed with the patient niece the patient can return home and she will assist with his care. Patient reports at home he is usually independent and still drives.  CSW inquired about patient receiving SSDI benefits. Patient inform CSW he is receiving 1200 dollars a month. CSW called [facility] admitting department to check for Medicare benefits. Admitting was able to locate patient information in the Medicare system. Patient chart should reflect Medicare Part A and Part B.  CSW provided information to patient on how to get a medicare replacement card.   Oncology social worker Anne Cunningham stop by and provided patient with resources (See note)  CSW inform Physician, Case Manager Suzanne, and Karen with [facility].   Plan: D/C Home one IV antibiotics.   Nicole Sinclair, LCSW, MSW Clinical Social Worker  [phone] 12/18/2018  12:51 PM  

## 2018-12-18 NOTE — Progress Notes (Signed)
[place] CSW Progress Notes  Met w patient, interpreter, inpatient CSW Ruffin Frederick and patient in inpatient room.  Patient now aware he has Medicare coverage, has information on how to request replacement card.  Also given information on Development worker, community of [place]) which can provide counseling on selection of Medicare supplement if patient desires.  Oncologist informed of discharge plan.  Edwyna Shell, LCSW Clinical Social Worker Phone:  [phone]

## 2018-12-18 NOTE — Care Management Note (Signed)
Case Management Note  Patient Details  Name: [name] [name] MRN: [id number] Date of Birth: 06-23-1961  Subjective/Objective:      Contacted by CSW that patient no longer needs SNF. Is able to speak clearly about desire to go home. Has prior experience with home IV abx.               Action/Plan: [facility]ri [facility] for indigent evaluation as patient is uninsured and Medicaid pending. Contacted [facility] at patient will need RW for home use. They will deliver to the room.   Expected Discharge Date:  12/08/18               Expected Discharge Plan:  [name]  In-House Referral:  Clinical Social Work  Discharge planning Services  CM Consult  Post Acute Care Choice:  Home Health Choice offered to:  Patient  DME Arranged:  Walker rolling DME Agency:  [place] Arranged:  RN, Disease Management [place] Agency:  [facility]  Status of Service:  Completed, signed off  If discussed at [place] of Stay Meetings, dates discussed:    Additional Comments:  Guadalupe Maple, RN 12/18/2018, 2:19 PM

## 2018-12-19 LAB — GLUCOSE, CAPILLARY
Glucose-Capillary: 103 mg/dL — ABNORMAL HIGH (ref 70–99)
Glucose-Capillary: 143 mg/dL — ABNORMAL HIGH (ref 70–99)

## 2018-12-19 MED ORDER — POLYETHYLENE GLYCOL 3350 17 G PO PACK
17.0000 g | PACK | Freq: Every day | ORAL | Status: DC
  Administered 2018-12-19: 17 g via ORAL
  Filled 2018-12-19: qty 1

## 2018-12-19 MED ORDER — PREDNISONE 10 MG PO TABS: 10.0000 mg | ORAL_TABLET | Freq: Every day | ORAL | Status: AC

## 2018-12-19 MED ORDER — HEPARIN SOD (PORK) LOCK FLUSH 100 UNIT/ML IV SOLN
250.0000 [IU] | INTRAVENOUS | Status: AC | PRN
  Administered 2018-12-19: 250 [IU]

## 2018-12-19 NOTE — Discharge Summary (Signed)
Physician Discharge Summary  [name] [name] XEN:[id number] DOB: 05-26-1961 DOA: 12/06/2018  PCP: [name], PA-C  Admit date: 12/06/2018 Discharge date: 12/19/2018  Admitted From: Home Disposition:  Home  Recommendations for Outpatient Follow-up:  1. Follow up with PCP in 1-2 weeks 2. Continue routine PICC care. 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 Admitted with dyspnea, cough, hemoptysis and chest x-ray evidence for R HCAP.Respiratory culture negative, blood culture MSSA. Had to be transferred to the ICU and was intubated. Subsequently extubated. Had a brief run of A. fib.  Discharge Diagnoses:  Principal Problem:   Bacteremia due to methicillin susceptible Staphylococcus aureus (MSSA) Active Problems:   Pleural effusion, left   COPD GOLD IV criteria but never smoked    Multiple myeloma (HCC)   Acute on chronic respiratory failure with hypoxia (HCC)   Grade II diastolic dysfunction   Community acquired pneumonia   Protein-calorie malnutrition, severe   Sepsis ([place])   Alpha-1-antitrypsin deficiency ([place])   Bronchiectasis ([place])   Acute respiratory distress  Acute on chronic respiratory failure with hypoxia This is most likely due to the pneumonia. Patient had to be intubated. He was successfully extubated on 2/9. Seems to be stable from a respiratory standpoint. Pulmonology has been following. O2 weaned to baseline 2-3LNC  Healthcare associated pneumonia involving the right lower lobe, presumed MSSA Patient has a history of bronchiectasis. He has been immunosuppressed due to chemotherapy. No evidence for invasive aspergillosis per pulmonology. He does however have a history of colonization.  Patient currently on cefazolin and acyclovir. ID had been following.Pt seen by speech.  MSSA bacteremia/septic shock He has been off of pressors. Vital signs are stable. Sepsis physiology has resolved. Echocardiogram did not show any endocarditis. Antibiotics as per infectious disease.As of now plan is for at least 3 weeks of IV cefazolin. PICC placed 2/12. Planning home abx with home health. Stop date of 12/26/18, would d/c PICC after completion of abx  Paroxysmal atrial fibrillation Patient had a brief run of on 2/8. He converted back to sinus rhythm spontaneously. Echocardiogram report as above. TSH noted to be 0.20. Free T4 normal at 1.16. Atrial fibrillation occurred in the setting of sepsis and infection.Has not had any further episodes. Remains in sinus rhythm. CHADS-VASc noted to be 0  Abdominal pain with loose stools Hold laxatives.Abdominal film did not show any acute findings. Suspect pain related to his loose stools. Continue to monitor for now.   History of bronchiectasis and COPD He has a history of alpha-1 antitrypsin deficiency. He is followed by pulmonology in the outpatient setting. He is on steroids currently. Currently on Pulmicort and nebulizer treatments.Complete steroid wean on discharge and to resume scheduled decadron per home med rec  Chronic left hydropneumothorax He has had VATS procedure remotely for empyema. Per pulmonology there is no indication of any active process in that area. Currently stable  History of multiple myeloma Revlimid remains on hold. Acyclovir has been continued. Followed by Dr. Irene Limbo.  Mild hypernatremia Sodium reviewed,currently within normal limits  Microcytic anemia Hemoglobin stable.No evidence of overt bleeding. Remains hemodynamically stable   Discharge Instructions  Discharge Instructions    Home infusion instructions [facility] May follow [place] Dosing Protocol; May  administer Cathflo as needed to maintain patency of vascular access  device.; Flushing of vascular access device: per [facility] Protocol: 0.9% NaCl pre/post medica...   Complete by:  As directed    Instructions:  May follow [place] Dosing Protocol   Instructions:  May administer Cathflo as needed to maintain patency of vascular access device.   Instructions:  Flushing of vascular access device: per [facility] Protocol: 0.9% NaCl pre/post medication administration and prn patency; Heparin 100 u/ml, 81m for implanted ports and Heparin 10u/ml, 53mfor all other central venous catheters.   Instructions:  May follow [facility] Anaphylaxis Protocol for First Dose Administration in the home: 0.9% NaCl at 25-50 ml/hr to maintain IV access for protocol meds. Epinephrine 0.3 ml IV/IM PRN and Benadryl 25-50 IV/IM PRN s/s of anaphylaxis.   Instructions:  AdMcRaenfusion Coordinator (RN) to assist per patient IV care needs in the home PRN.     Allergies as of 12/19/2018   No Known Allergies     Medication List    STOP taking these medications   nortriptyline 25 MG capsule Commonly known as:  PAMELOR     TAKE these medications   acyclovir 400 MG tablet Commonly known as:  ZOVIRAX Take 1 tablet (400 mg total) by mouth daily.   aspirin EC 81 MG tablet Take 1 tablet (81 mg total) by mouth daily.   ceFAZolin  IVPB Commonly known as:  ANCEF Inject 2 g into the vein every 8 (eight) hours for 9 days. Indication:  MSSA bacteremia Last Day of Therapy:  12/26/2018 Labs - Once weekly:  CBC/D and BMP,   dexamethasone 4 MG tablet Commonly known as:  DECADRON Take 5 tablets (20 mg total) by mouth once a week.   docusate sodium 50 MG capsule Commonly known as:  COLACE Take 1 capsule (50 mg total) by mouth daily.   Fluticasone-Umeclidin-Vilant 100-62.5-25 MCG/INH Aepb Commonly known as:  TRELEGY ELLIPTA Inhale 1 puff into the lungs daily.   guaiFENesin 600 MG 12 hr tablet Commonly known as:  MUCINEX Take  2 tablets (1,200 mg total) by mouth 2 (two) times daily.   omeprazole 40 MG capsule Commonly known as:  PRILOSEC Take 1 capsule (40 mg total) by mouth daily.   ondansetron 8 MG tablet Commonly known as:  ZOFRAN Take 1 tablet (8 mg total) by mouth every 8 (eight) hours as needed for nausea.   oxyCODONE-acetaminophen 5-325 MG tablet Commonly known as:  PERCOCET/ROXICET Take 1 tablet by mouth every 6 (six) hours as needed for severe pain.   OXYGEN 3L BEDTIME ONLY   predniSONE 10 MG tablet Commonly known as:  DELTASONE Take 1 tablet (10 mg total) by mouth daily. Taper dose: 6075mo daily x 3 days, then 36m84m daily x 3 days, then 20mg44mdaily x 3 days, then 10mg 73maily x 3 days, then 5mg po33mily x 2 days, then stop and resume decadron as previously prescribed   PROVENTIL HFA 108 (90 Base) MCG/ACT inhaler Generic drug:  albuterol INHALE 2 PUFFS EVERY 6 HOURS AS NEEDED FOR WHEEZING OR SHORTNESS OF BREATH.            Home Infusion Instuctions  (From admission, onward)         Start     Ordered   12/18/18 0000  Home infusion instructions [facility] May follow [facility] [place] Protocol; May administer Cathflo as needed to maintain patency of vascular access device.; Flushing of vascular access device[facility]: 0.9% NaCl pre/post medica...Marland KitchenMarland Kitchen  Question Answer Comment  Instructions May follow [place] Dosing Protocol   Instructions May administer Cathflo as needed to maintain patency of vascular access device.   Instructions Flushing of vascular access device: per [facility] Protocol: 0.9% NaCl pre/post medication administration and prn patency; Heparin 100 u/ml, 49m for implanted ports and Heparin 10u/ml, 569mfor all other central venous catheters.   Instructions May follow [facility] Anaphylaxis Protocol for First Dose Administration in the home: 0.9% NaCl at 25-50 ml/hr to maintain IV access for protocol meds. Epinephrine 0.3 ml IV/IM PRN and Benadryl 25-50 IV/IM PRN  s/s of anaphylaxis.   Instructions [facility] Infusion Coordinator (RN) to assist per patient IV care needs in the home PRN.      12/18/18 1646           Durable Medical Equipment  (From admission, onward)         Start     Ordered   12/18/18 1238  For home use only DME Walker rolling  Once    Question:  Patient needs a walker to treat with the following condition  Answer:  Weakness generalized   12/18/18 1237         Follow-up Information    MaLauraine RinneNP Follow up on 12/21/2018.   Specialty:  Pulmonary Disease Why:  2:00pm Contact information: [id number] [place] [id number] [place] [address]   Specialty:  Home Health Services Why:  nurse to assist with IV abx Contact information: [address] [id number]        No Known Allergies  Consultations:  PCCM  ID  Procedures/Studies: Dg Chest 2 View  Result Date: 12/06/2018 CLINICAL DATA:  Chest pain, shortness of breath EXAM: CHEST - 2 VIEW COMPARISON:  CTA chest dated 09/03/2018. FINDINGS: Bilateral lower lobe opacities, chronic, likely scarring/atelectasis. However, when correlating with priors, superimposed right lower lobe pneumonia is not excluded. Moderate left and small right pleural effusions, partially loculated on the left, chronic when correlating with CT. Calcified pleural plaques. The heart is normal in size. Pulmonary arterial hypertension. Pulmonary vascular congestion without frank interstitial edema. No pneumothorax IMPRESSION: Chronic lower lobe opacities, likely scarring/atelectasis, although superimposed right lower lobe pneumonia is not excluded. Moderate left and small right pleural effusions, chronic. Associated calcified pleural plaques, suggesting asbestos related pleural disease. Electronically Signed   By: SrJulian Hy.D.   On: 12/06/2018 11:39   Ct Chest Wo  Contrast  Result Date: 12/06/2018 CLINICAL DATA:  H/O multiple myeloma on Revlimid, pulmonary hypertension, chronic hypoxic respiratory failure, chronic left-sided hydropneumothorax, bronchiectasis, allergic bronchopulmonary aspergillosis, heterozygous alpha 1 antitrypsin deficiency, originally was treated for empyema with VATS decortication in 2016 Today presents with worsening shortness of breath that is started 3 days prior to admission. He also report worsening productive cough, hemoptysis over the last 3 days. EXAM: CT CHEST WITHOUT CONTRAST TECHNIQUE: Multidetector CT imaging of the chest was performed following the standard protocol without IV contrast. COMPARISON:  Current chest radiograph.  Prior chest CT, 09/03/2018. FINDINGS: Cardiovascular: Heart is normal in size. No significant pericardial effusion. No coronary artery calcifications. Aorta is normal in caliber.  Mild aortic atherosclerosis. Pulmonary arteries are enlarged. Main pulmonary artery measures 4.7 cm, right pulmonary artery 3.2 cm in left pulmonary artery 3 cm. Mediastinum/Nodes: No neck base or axillary masses or enlarged lymph nodes. No mediastinal or  hilar masses or pathologically enlarged lymph nodes. Trachea and esophagus are unremarkable. Lungs/Pleura: There is extensive consolidation throughout the right lower lobe new since the prior CT. Loculated left pleural effusion with pleural calcifications. There is pleural space air, containing tissue along the posterolateral mid to upper left hemithorax. These findings are without significant change from the prior chest CT. Linear and coarse reticular opacities noted in the peripheral left upper lower lobes, right middle lobe and base of the right upper lobe consistent with scarring/atelectasis, similar to the prior study. Minimal right pleural effusion. Pleural base calcifications on the right stable from the prior study. No evidence of pulmonary edema.  No pneumothorax. Upper Abdomen: No  acute findings. Musculoskeletal: No chest wall mass or suspicious bone lesions identified. IMPRESSION: 1. Extensive consolidation throughout the right lower lobe, new since the prior exam, consistent with pneumonia. 2. Chronic changes as detailed above, stable from the prior exam, including loculated left pleural fluid, a loculated area of left hemithorax pleural air containing tissue and surrounded by pleural based calcifications, areas of reticular scarring and/or atelectasis and right-sided pleural base calcifications. 3. There is also significant enlargement of the pulmonary arteries consistent with pulmonary hypertension. 4. Mild aortic atherosclerosis. Aortic Atherosclerosis (ICD10-I70.0). Electronically Signed   By: Lajean Manes M.D.   On: 12/06/2018 15:55   Dg Chest Port 1 View  Result Date: 12/13/2018 CLINICAL DATA:  Respiratory failure. EXAM: PORTABLE CHEST 1 VIEW COMPARISON:  12/12/2018 FINDINGS: The endotracheal tube is 3 cm above the carina. The NG tube is coursing down the esophagus and into the stomach. The right IJ central venous catheter tip is in the distal SVC. Chronic underlying lung changes with evidence of remote left-sided surgery and dense right-sided pleural calcification. Suspect a small left basilar pneumothorax. Overall improved aeration with probable resolving overlying pulmonary edema. IMPRESSION: 1. Support apparatus in good position without complicating features. 2. Suspect new small loculated left basilar pneumothorax. A right-side-down left side up decubitus film may be helpful for further evaluation. 3. Chronic underlying changes with evidence of improved aeration since yesterday's film likely representing resolving pulmonary edema. Electronically Signed   By: Marijo Sanes M.D.   On: 12/13/2018 05:00   Dg Chest Port 1 View  Result Date: 12/12/2018 CLINICAL DATA:  Endotracheal tube EXAM: PORTABLE CHEST 1 VIEW COMPARISON:  Yesterday FINDINGS: Endotracheal tube tip in good  position between the clavicular heads and carina. The orogastric tube is in the stomach. Unremarkable right IJ line. Bilateral pneumonia with left more than right pleural fluid which is at least partially chronic based on priors. Calcified pleural plaques. IMPRESSION: Stable hardware positioning and bilateral pneumonia. Electronically Signed   By: Monte Fantasia M.D.   On: 12/12/2018 06:40   Dg Chest Port 1 View  Result Date: 12/11/2018 CLINICAL DATA:  Ventilator support EXAM: PORTABLE CHEST 1 VIEW COMPARISON:  12/10/2018 FINDINGS: Endotracheal tube tip is 2 cm above the carina. Nasogastric tube enters the stomach with its tip in the fundus. Right internal jugular central line tip is in the SVC above the right atrium. Slightly less dense consolidation in the right upper lobe suggests mild improvement. Chronic bilateral pleural density with calcification. Chronic volume loss in the left lower lobe. IMPRESSION: Slight radiographic improvement in right upper lobe pneumonia. Electronically Signed   By: Nelson Chimes M.D.   On: 12/11/2018 06:47   Dg Chest Port 1 View  Result Date: 12/10/2018 CLINICAL DATA:  Endotracheal tube position EXAM: PORTABLE CHEST 1 VIEW COMPARISON:  12/09/2018 FINDINGS:  Endotracheal tube in good position. Right jugular central venous catheter tip in the mid SVC unchanged. NG in the stomach. Extensive right upper lobe infiltrate unchanged. Left lower lobe consolidation with mild improvement. Small bilateral pleural effusions. Calcified pleural plaque in the lung bases bilaterally IMPRESSION: Support lines remain in good position. Right upper lobe infiltrate unchanged. Improvement in left lower lobe consolidation. Bilateral pleural effusions left greater than right unchanged. Electronically Signed   By: Franchot Gallo M.D.   On: 12/10/2018 06:39   Dg Chest Port 1 View  Result Date: 12/09/2018 CLINICAL DATA:  Tube placement EXAM: PORTABLE CHEST 1 VIEW COMPARISON:  Portable exam 1030 hours  compared to 0642 hours FINDINGS: Tip of endotracheal tube projects 2.5 cm above carina. Nasogastric tube extends into stomach. RIGHT jugular central venous catheter with tip projecting over SVC. Upper normal size of cardiac silhouette. Atherosclerotic calcification aorta. Airspace infiltrates identified in both lungs greatest at RIGHT upper lobe. Bibasilar pleural effusions and LEFT base atelectasis. Pleural calcifications in the lower hemi thoraces bilaterally. No pneumothorax or acute bone lesions. IMPRESSION: Extensive BILATERAL pulmonary infiltrates greatest in RIGHT upper lobe with associated pleural calcifications, BILATERAL pleural effusions, and bibasilar atelectasis greater on LEFT. Line and tube positions as above. Electronically Signed   By: Lavonia Dana M.D.   On: 12/09/2018 11:05   Dg Chest Port 1 View  Result Date: 12/09/2018 CLINICAL DATA:  Endotracheal tube placement EXAM: PORTABLE CHEST 1 VIEW COMPARISON:  Yesterday FINDINGS: Interval intubation with endotracheal tube tip between the clavicular heads and carina. Bilateral pneumonia without evident interval change. Chronic pleural thickening/fluid on both sides with pleural calcification. Enlarged main pulmonary artery. No cardiomegaly. IMPRESSION: 1. New endotracheal tube in expected position. 2. Unchanged pneumonia. 3. Calcified pleural plaques. Electronically Signed   By: [place] Fantasia M.D.   On: 12/09/2018 07:07   Dg Chest Port 1 View  Result Date: 12/08/2018 CLINICAL DATA:  58 year old male with respiratory distress. EXAM: PORTABLE CHEST 1 VIEW COMPARISON:  Chest CT 12/06/2018 and earlier. FINDINGS: Portable AP semi upright view at 2320 hours. Progressed and now extensive abnormal opacity throughout most of the right lung. Similar increased left perihilar opacity. Underlying chronic left lung disease/fibrothorax. No definite acute pleural effusion. Stable mediastinal contours. Visualized tracheal air column is within normal limits.  Increased gaseous distension of the stomach. No acute osseous abnormality identified. IMPRESSION: 1. Progressed and Severe Bilateral Pneumonia since the CT on 12/06/2018. 2. Underlying fibrothorax, no definite acute pleural effusion. Electronically Signed   By: Genevie Ann M.D.   On: 12/08/2018 23:58   Dg Abd 2 Views  Result Date: 12/14/2018 CLINICAL DATA:  Abdominal pain. EXAM: ABDOMEN - 2 VIEW COMPARISON:  Chest x-ray December 13, 2018 FINDINGS: Suspected small loculated left basilar pneumothorax, unchanged since yesterday's chest x-ray. Opacity in the right base remains. The bowel gas pattern is nonobstructive. The abdomen is otherwise unremarkable. A left side down decubitus film demonstrates no free air. Calcification is seen in the right basilar pleura or parenchyma. No other acute abnormalities. IMPRESSION: No acute abnormality in the abdomen. A right basilar infiltrate. Suspected loculated pneumothorax in the left base, described on yesterday's chest x-ray. Electronically Signed   By: Dorise Bullion III M.D   On: 12/14/2018 14:34   Dg Swallowing Func-speech Pathology  Result Date: 12/14/2018 Objective Swallowing Evaluation: Type of Study: MBS-Modified Barium Swallow Study  Patient Details Name: [name] [name] MRN: [id number] Date of Birth: 1961/09/19 Today's Date: 12/14/2018 Time: SLP Start Time (ACUTE ONLY): 1430 -SLP Stop  Time (ACUTE ONLY): 1455 SLP Time Calculation (min) (ACUTE ONLY): 25 min Past Medical History: Past Medical History: Diagnosis Date . Acute on chronic respiratory failure ([place]) 02/07/2018 . Hypertension  . Kidney stones  . Multiple myeloma ([place])  . Obstructive lung disease (generalized) ([place])  . Pulmonary hypertension ([place])  Past Surgical History: Past Surgical History: Procedure Laterality Date . CARDIAC CATHETERIZATION N/A 01/18/2016  Procedure: Right Heart Cath;  Surgeon: Jolaine Artist, MD;  Location: [facility];  Service: Cardiovascular;  Laterality: N/A; . CARDIAC CATHETERIZATION  N/A 06/24/2016  Procedure: Right Heart Cath;  Surgeon: Jolaine Artist, MD;  Location: [place] [facility];  Service: Cardiovascular;  Laterality: N/A; . IR RADIOLOGIST EVAL & MGMT  05/27/2018 . IR RADIOLOGIST EVAL & MGMT  09/01/2018 . KIDNEY SURGERY   . VIDEO ASSISTED THORACOSCOPY (VATS)/DECORTICATION Left 2003 . VIDEO ASSISTED THORACOSCOPY (VATS)/DECORTICATION Left 10/02/2015  Procedure: VIDEO ASSISTED THORACOSCOPY (VATS)/DECORTICATION and drainage of chronic empyena;  Surgeon: [name], MD;  Location: [facility];  Service: Thoracic;  Laterality: Left; Marland Kitchen VIDEO BRONCHOSCOPY N/A 10/02/2015  Procedure: VIDEO BRONCHOSCOPY;  Surgeon: [name], MD;  Location: [facility];  Service: Thoracic;  Laterality: N/A; HPI: Pt adm too [place] with difficulty breathing.  Pt has h/o bronchiectasis, COPD, Alpha 1 MZ.  He required intubation from 2/5-12/13/2018.  CXR showing increased right lower lobe pna. Swallow evaluation ordered.   Subjective: pt awake in chair Assessment / Plan / Recommendation CHL IP CLINICAL IMPRESSIONS 12/14/2018 Clinical Impression Patient presents with functional oropharyngeal swallow ability with no aspiration or laryngeal penetration of any consistency tested *thin, nectar, pudding, cracker, and tablet with thin.  Swallow was timely and strong without residuals. Barium tablet oral transit required 2 swallows of liquids but cleared readily into esophagus.  Given pt report of sensation of residuals at pharynx and his RR up to 30s, will follow up x1 to assure po tolerance.  Thanks for allowing me to help with this pt.  Of note, pt had interpreter scheduled for two hours starting at 11 am 12/15/2018. RN informed of testing results and interpreter status.   SLP Visit Diagnosis Dysphagia, unspecified (R13.10) Attention and concentration deficit following -- Frontal lobe and executive function deficit following -- Impact on safety and function Mild aspiration risk   CHL IP TREATMENT RECOMMENDATION 12/14/2018  Treatment Recommendations Therapy as outlined in treatment plan below   Prognosis 12/14/2018 Prognosis for Safe Diet Advancement Good Barriers to Reach Goals -- Barriers/Prognosis Comment -- CHL IP DIET RECOMMENDATION 12/14/2018 SLP Diet Recommendations Regular solids;Thin liquid Liquid Administration via Cup;Straw Medication Administration (No Data) Compensations Slow rate;Small sips/bites Postural Changes Seated upright at 90 degrees;Remain semi-upright after after feeds/meals (Comment)   CHL IP OTHER RECOMMENDATIONS 12/14/2018 Recommended Consults -- Oral Care Recommendations Oral care BID Other Recommendations --   No flowsheet data found.  CHL IP FREQUENCY AND DURATION 12/14/2018 Speech Therapy Frequency (ACUTE ONLY) min 1 x/week Treatment Duration 1 week      CHL IP ORAL PHASE 12/14/2018 Oral Phase WFL Oral - Pudding Teaspoon -- Oral - Pudding Cup -- Oral - Honey Teaspoon -- Oral - Honey Cup -- Oral - Nectar Teaspoon -- Oral - Nectar Cup -- Oral - Nectar Straw -- Oral - Thin Teaspoon -- Oral - Thin Cup -- Oral - Thin Straw -- Oral - Puree -- Oral - Mech Soft -- Oral - Regular -- Oral - Multi-Consistency -- Oral - Pill -- Oral Phase - Comment --  CHL IP PHARYNGEAL PHASE  12/14/2018 Pharyngeal Phase WFL Pharyngeal- Pudding Teaspoon -- Pharyngeal -- Pharyngeal- Pudding Cup -- Pharyngeal -- Pharyngeal- Honey Teaspoon -- Pharyngeal -- Pharyngeal- Honey Cup -- Pharyngeal -- Pharyngeal- Nectar Teaspoon -- Pharyngeal -- Pharyngeal- Nectar Cup -- Pharyngeal -- Pharyngeal- Nectar Straw -- Pharyngeal -- Pharyngeal- Thin Teaspoon -- Pharyngeal -- Pharyngeal- Thin Cup -- Pharyngeal -- Pharyngeal- Thin Straw -- Pharyngeal -- Pharyngeal- Puree -- Pharyngeal -- Pharyngeal- Mechanical Soft -- Pharyngeal -- Pharyngeal- Regular -- Pharyngeal -- Pharyngeal- Multi-consistency -- Pharyngeal -- Pharyngeal- Pill -- Pharyngeal -- Pharyngeal Comment --  CHL IP CERVICAL ESOPHAGEAL PHASE 12/14/2018 Cervical Esophageal Phase WFL Pudding Teaspoon  -- Pudding Cup -- Honey Teaspoon -- Honey Cup -- Nectar Teaspoon -- Nectar Cup -- Nectar Straw -- Thin Teaspoon -- Thin Cup -- Thin Straw -- Puree -- Mechanical Soft -- Regular -- Multi-consistency -- Pill -- Cervical Esophageal Comment -- Macario Golds 12/14/2018, 3:28 PM  Luanna Salk, MS [facility] SLP Acute Rehab Services Pager [phone] Office [phone]             [place] Ekg Site Rite  Result Date: 12/16/2018 If Site Rite image not attached, placement could not be confirmed due to current cardiac rhythm.   Subjective: Without complaints at present  Discharge Exam: Vitals:   12/19/18 0435 12/19/18 0841  BP: 133/78   Pulse: 75   Resp: 16   Temp: 97.9 F (36.6 C)   SpO2: 100% 98%   Vitals:   12/18/18 2056 12/18/18 2112 12/19/18 0435 12/19/18 0841  BP: 127/76  133/78   Pulse: 81  75   Resp: 16  16   Temp: 98.4 F (36.9 C)  97.9 F (36.6 C)   TempSrc: Oral  Oral   SpO2: 100% (!) 89% 100% 98%  Weight:      Height:        General: Pt is alert, awake, not in acute distress Cardiovascular: RRR, S1/S2 +, no rubs, no gallops Respiratory: CTA bilaterally, no wheezing, no rhonchi Abdominal: Soft, NT, ND, bowel sounds + Extremities: no edema, no cyanosis   The results of significant diagnostics from this hospitalization (including imaging, microbiology, ancillary and laboratory) are listed below for reference.     Microbiology: Recent Results (from the past 240 hour(s))  Aspergillus Ag, BAL/Serum     Status: Abnormal   Collection Time: 12/09/18  5:12 PM  Result Value Ref Range Status   Aspergillus Ag, BAL/Serum 1.75 (H) 0.00 - 0.49 Index Final    Comment: (NOTE) Performed At: [facility] [address] [place], [place] [id number] Rush Farmer MD NA:[id number] Performed At: [facility] [place], [place] [id number] Nechama Guard MD EG:[id number]   Culture, respiratory     Status: None   Collection Time: 12/10/18 11:20 AM  Result  Value Ref Range Status   Specimen Description   Final    TRACHEAL ASPIRATE Performed at [facility], [place] [address] [place], [place] [address]    Special Requests   Final    NONE Performed at [facility], [place] [address] [place], [place] [address]    Gram Stain   Final    RARE WBC PRESENT, PREDOMINANTLY PMN FEW GRAM POSITIVE COCCI IN CLUSTERS Performed at [facility], [place] [address], [place], [place] [address]    Culture RARE STAPHYLOCOCCUS AUREUS  Final   Report Status 12/12/2018 FINAL  Final   Organism ID, Bacteria STAPHYLOCOCCUS AUREUS  Final      Susceptibility  Staphylococcus aureus - MIC*    CIPROFLOXACIN <=0.5 SENSITIVE Sensitive     ERYTHROMYCIN >=8 RESISTANT Resistant     GENTAMICIN <=0.5 SENSITIVE Sensitive     OXACILLIN 0.5 SENSITIVE Sensitive     TETRACYCLINE <=1 SENSITIVE Sensitive     VANCOMYCIN <=0.5 SENSITIVE Sensitive     TRIMETH/SULFA <=10 SENSITIVE Sensitive     CLINDAMYCIN <=0.25 SENSITIVE Sensitive     RIFAMPIN <=0.5 SENSITIVE Sensitive     Inducible Clindamycin NEGATIVE Sensitive     * RARE STAPHYLOCOCCUS AUREUS     Labs: BNP (last 3 results) Recent Labs    01/26/18 1445 01/26/18 1900 12/09/18 0446  BNP 465.9* 456.2* 500.3*   Basic Metabolic Panel: Recent Labs  Lab 12/13/18 0500 12/14/18 0506 12/15/18 0515 12/16/18 0322  NA 147* 148* 141 140  K 3.2* 3.6 3.5 3.6  CL 105 108 104 102  CO2 37* 34* 31 32  GLUCOSE 154* 119* 132* 139*  BUN 34* 30* 23* 17  CREATININE 0.69 0.65 0.59* 0.53*  CALCIUM 6.3* 6.5* 6.5* 6.6*  MG 1.8 2.0  --   --   PHOS 2.1* 2.1*  --   --    Liver Function Tests: No results for input(s): AST, ALT, ALKPHOS, BILITOT, PROT, ALBUMIN in the last 168 hours. No results for input(s): LIPASE, AMYLASE in the last 168 hours. No results for input(s): AMMONIA in the last 168 hours. CBC: Recent Labs  Lab 12/13/18 0500 12/14/18 0506 12/15/18 0515 12/16/18 0322  WBC 8.4 7.4  5.9 5.5  HGB 8.5* 8.5* 8.4* 8.3*  HCT 26.6* 27.2* 27.1* 27.0*  MCV 78.0* 79.3* 79.9* 79.2*  PLT 117* 177 214 270   Cardiac Enzymes: No results for input(s): CKTOTAL, CKMB, CKMBINDEX, TROPONINI in the last 168 hours. BNP: Invalid input(s): POCBNP CBG: Recent Labs  Lab 12/18/18 0726 12/18/18 1124 12/18/18 1635 12/18/18 2103 12/19/18 0752  GLUCAP 124* 124* 221* 184* 103*   D-Dimer No results for input(s): DDIMER in the last 72 hours. Hgb A1c No results for input(s): HGBA1C in the last 72 hours. Lipid Profile No results for input(s): CHOL, HDL, LDLCALC, TRIG, CHOLHDL, LDLDIRECT in the last 72 hours. Thyroid function studies No results for input(s): TSH, T4TOTAL, T3FREE, THYROIDAB in the last 72 hours.  Invalid input(s): FREET3 Anemia work up No results for input(s): VITAMINB12, FOLATE, FERRITIN, TIBC, IRON, RETICCTPCT in the last 72 hours. Urinalysis    Component Value Date/Time   COLORURINE YELLOW 12/06/2018 1403   APPEARANCEUR CLEAR 12/06/2018 1403   LABSPEC 1.011 12/06/2018 1403   PHURINE 7.0 12/06/2018 1403   GLUCOSEU NEGATIVE 12/06/2018 1403   HGBUR NEGATIVE 12/06/2018 1403   BILIRUBINUR NEGATIVE 12/06/2018 1403   KETONESUR 5 (A) 12/06/2018 1403   PROTEINUR NEGATIVE 12/06/2018 1403   UROBILINOGEN 0.2 11/11/2012 0145   NITRITE NEGATIVE 12/06/2018 1403   LEUKOCYTESUR NEGATIVE 12/06/2018 1403   Sepsis Labs Invalid input(s): PROCALCITONIN,  WBC,  LACTICIDVEN Microbiology Recent Results (from the past 240 hour(s))  Aspergillus Ag, BAL/Serum     Status: Abnormal   Collection Time: 12/09/18  5:12 PM  Result Value Ref Range Status   Aspergillus Ag, BAL/Serum 1.75 (H) 0.00 - 0.49 Index Final    Comment: (NOTE) Performed At: [facility] [address] [place], [place] [id number] Rush Farmer MD [id number] Performed At: [facility] [address] [place], [place] [id number] Nechama Guard MD VW:[id number]   Culture, respiratory     Status:  None   Collection Time: 12/10/18 11:20 AM  Result  Value Ref Range Status   Specimen Description   Final    TRACHEAL ASPIRATE Performed at [place] [address] [place], [place] [address]    Special Requests   Final    NONE Performed at [facility], [place] [address], [place], [place] [address]    Gram Stain   Final    RARE WBC PRESENT, PREDOMINANTLY PMN FEW GRAM POSITIVE COCCI IN CLUSTERS Performed at [facility], [place] [address], [place], [place] [address]    Culture RARE STAPHYLOCOCCUS AUREUS  Final   Report Status 12/12/2018 FINAL  Final   Organism ID, Bacteria STAPHYLOCOCCUS AUREUS  Final      Susceptibility   Staphylococcus aureus - MIC*    CIPROFLOXACIN <=0.5 SENSITIVE Sensitive     ERYTHROMYCIN >=8 RESISTANT Resistant     GENTAMICIN <=0.5 SENSITIVE Sensitive     OXACILLIN 0.5 SENSITIVE Sensitive     TETRACYCLINE <=1 SENSITIVE Sensitive     VANCOMYCIN <=0.5 SENSITIVE Sensitive     TRIMETH/SULFA <=10 SENSITIVE Sensitive     CLINDAMYCIN <=0.25 SENSITIVE Sensitive     RIFAMPIN <=0.5 SENSITIVE Sensitive     Inducible Clindamycin NEGATIVE Sensitive     * RARE STAPHYLOCOCCUS AUREUS   Time spent: 30 min  SIGNED:   Marylu Lund, MD  [facility] 12/19/2018, 10:59 AM  If 7PM-7AM, please contact night-coverage

## 2018-12-19 NOTE — Progress Notes (Signed)
Patient D/C home with his PICC line for home healthcare to be able to administrate  IV ABT.  Pt was alert and oriented x 4. No c/o pain or discomfort. All discharge instructions were given to patient. He verbalize understanding of all instructions. And denied any concern at this time. Pt transferred to lobby in w/c.

## 2018-12-21 ENCOUNTER — Encounter: Payer: Self-pay | Admitting: Primary Care

## 2018-12-21 ENCOUNTER — Inpatient Hospital Stay: Payer: Self-pay | Admitting: Pulmonary Disease

## 2018-12-22 ENCOUNTER — Telehealth (INDEPENDENT_AMBULATORY_CARE_PROVIDER_SITE_OTHER): Payer: Self-pay | Admitting: Primary Care

## 2018-12-22 NOTE — Telephone Encounter (Signed)
Dan Europe [phone]) from [facility] is calling requesting verbal orders for physical therapy for 2 a week for 4 weeks. [place] desk gave verbal orders.  Tillie Rung (Case Manager) [phone]    Thank you Emmit Pomfret

## 2018-12-23 ENCOUNTER — Ambulatory Visit: Payer: Self-pay

## 2018-12-23 ENCOUNTER — Other Ambulatory Visit: Payer: Self-pay

## 2018-12-23 NOTE — Telephone Encounter (Signed)
Noted. Duke Weisensel S Berkley Wrightsman, CMA  

## 2018-12-25 ENCOUNTER — Encounter (HOSPITAL_COMMUNITY): Payer: Self-pay | Admitting: Emergency Medicine

## 2018-12-25 ENCOUNTER — Emergency Department (HOSPITAL_COMMUNITY): Payer: Medicare Other

## 2018-12-25 ENCOUNTER — Observation Stay (HOSPITAL_COMMUNITY)
Admission: EM | Admit: 2018-12-25 | Discharge: 2018-12-27 | Disposition: A | Discharge status: Home / Self Care | Payer: Medicare Other | Attending: Internal Medicine | Admitting: Internal Medicine

## 2018-12-25 DIAGNOSIS — Z7951 Long term (current) use of inhaled steroids: Secondary | ICD-10-CM | POA: Insufficient documentation

## 2018-12-25 DIAGNOSIS — Z79899 Other long term (current) drug therapy: Secondary | ICD-10-CM | POA: Insufficient documentation

## 2018-12-25 DIAGNOSIS — J90 Pleural effusion, not elsewhere classified: Secondary | ICD-10-CM

## 2018-12-25 DIAGNOSIS — Z9981 Dependence on supplemental oxygen: Secondary | ICD-10-CM | POA: Diagnosis not present

## 2018-12-25 DIAGNOSIS — I10 Essential (primary) hypertension: Secondary | ICD-10-CM | POA: Insufficient documentation

## 2018-12-25 DIAGNOSIS — B9561 Methicillin susceptible Staphylococcus aureus infection as the cause of diseases classified elsewhere: Secondary | ICD-10-CM | POA: Diagnosis present

## 2018-12-25 DIAGNOSIS — Z8619 Personal history of other infectious and parasitic diseases: Secondary | ICD-10-CM | POA: Insufficient documentation

## 2018-12-25 DIAGNOSIS — C9000 Multiple myeloma not having achieved remission: Secondary | ICD-10-CM | POA: Diagnosis not present

## 2018-12-25 DIAGNOSIS — Z7982 Long term (current) use of aspirin: Secondary | ICD-10-CM | POA: Diagnosis not present

## 2018-12-25 DIAGNOSIS — Z7952 Long term (current) use of systemic steroids: Secondary | ICD-10-CM | POA: Insufficient documentation

## 2018-12-25 DIAGNOSIS — J439 Emphysema, unspecified: Secondary | ICD-10-CM | POA: Diagnosis not present

## 2018-12-25 DIAGNOSIS — Z8701 Personal history of pneumonia (recurrent): Secondary | ICD-10-CM | POA: Insufficient documentation

## 2018-12-25 DIAGNOSIS — J9611 Chronic respiratory failure with hypoxia: Secondary | ICD-10-CM | POA: Diagnosis not present

## 2018-12-25 DIAGNOSIS — E8801 Alpha-1-antitrypsin deficiency: Secondary | ICD-10-CM | POA: Diagnosis present

## 2018-12-25 DIAGNOSIS — N179 Acute kidney failure, unspecified: Secondary | ICD-10-CM | POA: Diagnosis not present

## 2018-12-25 DIAGNOSIS — Z8249 Family history of ischemic heart disease and other diseases of the circulatory system: Secondary | ICD-10-CM | POA: Insufficient documentation

## 2018-12-25 DIAGNOSIS — R7881 Bacteremia: Secondary | ICD-10-CM | POA: Diagnosis not present

## 2018-12-25 DIAGNOSIS — I272 Pulmonary hypertension, unspecified: Secondary | ICD-10-CM | POA: Diagnosis not present

## 2018-12-25 DIAGNOSIS — R109 Unspecified abdominal pain: Secondary | ICD-10-CM

## 2018-12-25 DIAGNOSIS — N1330 Unspecified hydronephrosis: Principal | ICD-10-CM | POA: Diagnosis present

## 2018-12-25 DIAGNOSIS — R1031 Right lower quadrant pain: Secondary | ICD-10-CM

## 2018-12-25 DIAGNOSIS — J948 Other specified pleural conditions: Secondary | ICD-10-CM | POA: Diagnosis present

## 2018-12-25 DIAGNOSIS — R110 Nausea: Secondary | ICD-10-CM

## 2018-12-25 LAB — COMPREHENSIVE METABOLIC PANEL
ALT: 6 U/L (ref 0–44)
AST: 19 U/L (ref 15–41)
Albumin: 2.4 g/dL — ABNORMAL LOW (ref 3.5–5.0)
Alkaline Phosphatase: 60 U/L (ref 38–126)
Anion gap: 7 (ref 5–15)
BUN: 21 mg/dL — ABNORMAL HIGH (ref 6–20)
CALCIUM: 7.9 mg/dL — AB (ref 8.9–10.3)
CO2: 38 mmol/L — AB (ref 22–32)
Chloride: 90 mmol/L — ABNORMAL LOW (ref 98–111)
Creatinine, Ser: 0.95 mg/dL (ref 0.61–1.24)
GFR calc Af Amer: >60 mL/min (ref >60)
GFR calc non Af Amer: >60 mL/min (ref >60)
Glucose, Bld: 142 mg/dL — ABNORMAL HIGH (ref 70–99)
Potassium: 4 mmol/L (ref 3.5–5.1)
Sodium: 135 mmol/L (ref 135–145)
Total Bilirubin: 0.4 mg/dL (ref 0.3–1.2)
Total Protein: 6.1 g/dL — ABNORMAL LOW (ref 6.5–8.1)

## 2018-12-25 LAB — CBC WITH DIFFERENTIAL/PLATELET
Abs Immature Granulocytes: 0.05 10*3/uL (ref 0.00–0.07)
Basophils Absolute: 0 10*3/uL (ref 0.0–0.1)
Basophils Relative: 0 %
Eosinophils Absolute: 0 10*3/uL (ref 0.0–0.5)
Eosinophils Relative: 0 %
HCT: 27.2 % — ABNORMAL LOW (ref 39.0–52.0)
Hemoglobin: 8.5 g/dL — ABNORMAL LOW (ref 13.0–17.0)
Immature Granulocytes: 1 %
Lymphocytes Relative: 2 %
Lymphs Abs: 0.2 10*3/uL — ABNORMAL LOW (ref 0.7–4.0)
MCH: 25 pg — AB (ref 26.0–34.0)
MCHC: 31.3 g/dL (ref 30.0–36.0)
MCV: 80 fL (ref 80.0–100.0)
Monocytes Absolute: 0.5 10*3/uL (ref 0.1–1.0)
Monocytes Relative: 5 %
Neutro Abs: 8.8 10*3/uL — ABNORMAL HIGH (ref 1.7–7.7)
Neutrophils Relative %: 92 %
Platelets: 268 10*3/uL (ref 150–400)
RBC: 3.4 MIL/uL — AB (ref 4.22–5.81)
RDW: 16.9 % — ABNORMAL HIGH (ref 11.5–15.5)
WBC: 9.6 10*3/uL (ref 4.0–10.5)
nRBC: 0 % (ref 0.0–0.2)

## 2018-12-25 LAB — URINALYSIS, ROUTINE W REFLEX MICROSCOPIC
Bilirubin Urine: NEGATIVE
Glucose, UA: 50 mg/dL — AB
KETONES UR: NEGATIVE mg/dL
Leukocytes,Ua: NEGATIVE
Nitrite: NEGATIVE
Protein, ur: NEGATIVE mg/dL
RBC / HPF: >50 RBC/hpf — ABNORMAL HIGH (ref 0–5)
Specific Gravity, Urine: 1.012 (ref 1.005–1.030)
pH: 8 (ref 5.0–8.0)

## 2018-12-25 LAB — LACTIC ACID, PLASMA: Lactic Acid, Venous: 1.2 mmol/L (ref 0.5–1.9)

## 2018-12-25 LAB — LIPASE, BLOOD: Lipase: 33 U/L (ref 11–51)

## 2018-12-25 MED ORDER — ALBUTEROL SULFATE (2.5 MG/3ML) 0.083% IN NEBU
3.0000 mL | INHALATION_SOLUTION | Freq: Four times a day (QID) | RESPIRATORY_TRACT | Status: DC | PRN
  Administered 2018-12-26: 3 mL via RESPIRATORY_TRACT
  Filled 2018-12-25: qty 3

## 2018-12-25 MED ORDER — MORPHINE SULFATE (PF) 4 MG/ML IV SOLN
4.0000 mg | Freq: Once | INTRAVENOUS | Status: AC
  Administered 2018-12-25: 4 mg via INTRAVENOUS
  Filled 2018-12-25: qty 1

## 2018-12-25 MED ORDER — FLUTICASONE FUROATE-VILANTEROL 100-25 MCG/INH IN AEPB
1.0000 | INHALATION_SPRAY | Freq: Every day | RESPIRATORY_TRACT | Status: DC
  Administered 2018-12-26 – 2018-12-27 (×2): 1 via RESPIRATORY_TRACT
  Filled 2018-12-25: qty 28

## 2018-12-25 MED ORDER — ACYCLOVIR 400 MG PO TABS
400.0000 mg | ORAL_TABLET | Freq: Every day | ORAL | Status: DC
  Administered 2018-12-26 – 2018-12-27 (×2): 400 mg via ORAL
  Filled 2018-12-25 (×2): qty 1

## 2018-12-25 MED ORDER — SODIUM CHLORIDE (PF) 0.9 % IJ SOLN
INTRAMUSCULAR | Status: AC
  Filled 2018-12-25: qty 50

## 2018-12-25 MED ORDER — ONDANSETRON HCL 4 MG/2ML IJ SOLN
4.0000 mg | Freq: Once | INTRAMUSCULAR | Status: AC
  Administered 2018-12-25: 4 mg via INTRAVENOUS
  Filled 2018-12-25: qty 2

## 2018-12-25 MED ORDER — UMECLIDINIUM BROMIDE 62.5 MCG/INH IN AEPB
1.0000 | INHALATION_SPRAY | Freq: Every day | RESPIRATORY_TRACT | Status: DC
  Administered 2018-12-26 – 2018-12-27 (×2): 1 via RESPIRATORY_TRACT
  Filled 2018-12-25: qty 7

## 2018-12-25 MED ORDER — CEFAZOLIN SODIUM-DEXTROSE 2-4 GM/100ML-% IV SOLN
2.0000 g | Freq: Three times a day (TID) | INTRAVENOUS | Status: AC
  Administered 2018-12-25 – 2018-12-26 (×4): 2 g via INTRAVENOUS
  Filled 2018-12-25 (×4): qty 100

## 2018-12-25 MED ORDER — ACETAMINOPHEN 325 MG PO TABS: 650.0000 mg | ORAL_TABLET | Freq: Four times a day (QID) | ORAL | Status: DC | PRN

## 2018-12-25 MED ORDER — MORPHINE SULFATE (PF) 2 MG/ML IV SOLN
2.0000 mg | INTRAVENOUS | Status: DC | PRN
  Administered 2018-12-26 – 2018-12-27 (×4): 2 mg via INTRAVENOUS
  Filled 2018-12-25 (×4): qty 1

## 2018-12-25 MED ORDER — PREDNISONE 10 MG PO TABS: 10.0000 mg | ORAL_TABLET | Freq: Every day | ORAL | Status: DC

## 2018-12-25 MED ORDER — PREDNISONE 20 MG PO TABS
20.0000 mg | ORAL_TABLET | Freq: Every day | ORAL | Status: DC
  Administered 2018-12-26 – 2018-12-27 (×2): 20 mg via ORAL
  Filled 2018-12-25 (×2): qty 1

## 2018-12-25 MED ORDER — ONDANSETRON HCL 4 MG PO TABS: 4.0000 mg | ORAL_TABLET | Freq: Four times a day (QID) | ORAL | Status: DC | PRN

## 2018-12-25 MED ORDER — ASPIRIN EC 81 MG PO TBEC
81.0000 mg | DELAYED_RELEASE_TABLET | Freq: Every day | ORAL | Status: DC
  Administered 2018-12-26 – 2018-12-27 (×2): 81 mg via ORAL
  Filled 2018-12-25 (×2): qty 1

## 2018-12-25 MED ORDER — FLUTICASONE-UMECLIDIN-VILANT 100-62.5-25 MCG/INH IN AEPB: 1.0000 | INHALATION_SPRAY | Freq: Every day | RESPIRATORY_TRACT | Status: DC

## 2018-12-25 MED ORDER — SODIUM CHLORIDE 0.9 % IV BOLUS
1000.0000 mL | Freq: Once | INTRAVENOUS | Status: AC
  Administered 2018-12-25: 1000 mL via INTRAVENOUS

## 2018-12-25 MED ORDER — ENOXAPARIN SODIUM 40 MG/0.4ML ~~LOC~~ SOLN
40.0000 mg | SUBCUTANEOUS | Status: DC
  Administered 2018-12-26 – 2018-12-27 (×2): 40 mg via SUBCUTANEOUS
  Filled 2018-12-25 (×2): qty 0.4

## 2018-12-25 MED ORDER — GUAIFENESIN ER 600 MG PO TB12
1200.0000 mg | ORAL_TABLET | Freq: Two times a day (BID) | ORAL | Status: DC
  Administered 2018-12-25 – 2018-12-27 (×4): 1200 mg via ORAL
  Filled 2018-12-25 (×4): qty 2

## 2018-12-25 MED ORDER — IOPAMIDOL (ISOVUE-300) INJECTION 61%
INTRAVENOUS | Status: AC
  Administered 2018-12-25: 20:00:00
  Filled 2018-12-25: qty 100

## 2018-12-25 MED ORDER — ACETAMINOPHEN 650 MG RE SUPP: 650.0000 mg | Freq: Four times a day (QID) | RECTAL | Status: DC | PRN

## 2018-12-25 MED ORDER — PREDNISONE 5 MG PO TABS: 5.0000 mg | ORAL_TABLET | Freq: Every day | ORAL | Status: DC

## 2018-12-25 MED ORDER — ONDANSETRON HCL 4 MG/2ML IJ SOLN: 4.0000 mg | Freq: Four times a day (QID) | INTRAMUSCULAR | Status: DC | PRN

## 2018-12-25 MED ORDER — CEFAZOLIN IV (FOR PTA / DISCHARGE USE ONLY): 2.0000 g | Freq: Three times a day (TID) | INTRAVENOUS | Status: DC

## 2018-12-25 MED ORDER — IOHEXOL 300 MG/ML  SOLN: 30.0000 mL | Freq: Once | INTRAMUSCULAR | Status: DC | PRN

## 2018-12-25 MED ORDER — IOPAMIDOL (ISOVUE-300) INJECTION 61%
100.0000 mL | Freq: Once | INTRAVENOUS | Status: AC | PRN
  Administered 2018-12-25: 75 mL via INTRAVENOUS

## 2018-12-25 NOTE — ED Notes (Signed)
ED TO INPATIENT HANDOFF REPORT  Name/Age/Gender [name] [name] 58 y.o. male  Code Status    Code Status Orders  (From admission, onward)         Start     Ordered   12/25/18 2224  Full code  Continuous     12/25/18 2224        Code Status History    Date Active Date Inactive Code Status Order ID Comments User Context   12/06/2018 5284 12/19/2018 1743 Full Code [id number]  Elmarie Shiley, MD Inpatient   09/03/2018 2007 09/05/2018 1809 Full Code [id number]  Oswald Hillock, MD Inpatient   02/07/2018 0820 02/14/2018 1943 Full Code [id number]  Lady Deutscher, MD ED   01/26/2018 1831 01/29/2018 1630 Full Code [id number]  Cristy Folks, MD Inpatient   11/06/2016 0103 11/09/2016 1739 Full Code [id number]  Rise Patience, MD Inpatient   01/11/2016 2227 01/23/2016 1654 Full Code [id number]  Reubin Milan, MD Inpatient   10/02/2015 1505 10/20/2015 2127 Full Code [id number]  Nani Skillern, PA-C Inpatient   09/26/2015 0425 09/30/2015 2012 Full Code [id number]  Danford, Suann Larry, MD Inpatient      Home/SNF/Other Home  Chief Complaint right flank pain   Level of Care/Admitting Diagnosis ED Disposition    ED Disposition Condition [facility]: [facility] [[id number]]  Level of Care: Med-Surg [16]  Diagnosis: Hydronephrosis of right kidney [237628]  Admitting Physician: Etta Quill [[id number]]  Attending Physician: Etta Quill [[id number]]  PT Class (Do Not Modify): Observation [104]  PT Acc Code (Do Not Modify): Observation [10022]       Medical History Past Medical History:  Diagnosis Date  . Acute on chronic respiratory failure ([place]) 02/07/2018  . Hypertension   . Kidney stones   . Multiple myeloma ([place])   . Obstructive lung disease (generalized) ([place])   . Pulmonary hypertension ([facility])     Allergies No Known Allergies  IV Location/Drains/Wounds Patient Lines/Drains/Airways Status   Active Line/Drains/Airways    Name:    Placement date:   Placement time:   Site:   Days:   PICC Single Lumen 12/16/18 PICC Right Brachial 35 cm 0 cm   12/16/18    1310    Brachial   9          Labs/Imaging Results for orders placed or performed during the hospital encounter of 12/25/18 (from the past 48 hour(s))  CBC with Differential     Status: Abnormal   Collection Time: 12/25/18  6:16 PM  Result Value Ref Range   WBC 9.6 4.0 - 10.5 K/uL   RBC 3.40 (L) 4.22 - 5.81 MIL/uL   Hemoglobin 8.5 (L) 13.0 - 17.0 g/dL   HCT 27.2 (L) 39.0 - 52.0 %   MCV 80.0 80.0 - 100.0 fL   MCH 25.0 (L) 26.0 - 34.0 pg   MCHC 31.3 30.0 - 36.0 g/dL   RDW 16.9 (H) 11.5 - 15.5 %   Platelets 268 150 - 400 K/uL   nRBC 0.0 0.0 - 0.2 %   Neutrophils Relative % 92 %   Neutro Abs 8.8 (H) 1.7 - 7.7 K/uL   Lymphocytes Relative 2 %   Lymphs Abs 0.2 (L) 0.7 - 4.0 K/uL   Monocytes Relative 5 %   Monocytes Absolute 0.5 0.1 - 1.0 K/uL   Eosinophils Relative 0 %   Eosinophils Absolute 0.0 0.0 - 0.5 K/uL   Basophils Relative 0 %  Basophils Absolute 0.0 0.0 - 0.1 K/uL   WBC Morphology MORPHOLOGY UNREMARKABLE    RBC Morphology MORPHOLOGY UNREMARKABLE    Immature Granulocytes 1 %   Abs Immature Granulocytes 0.05 0.00 - 0.07 K/uL    Comment: Performed at [facility], [place] [address], [place], [place] [address]  Comprehensive metabolic panel     Status: Abnormal   Collection Time: 12/25/18  6:16 PM  Result Value Ref Range   Sodium 135 135 - 145 mmol/L   Potassium 4.0 3.5 - 5.1 mmol/L   Chloride 90 (L) 98 - 111 mmol/L   CO2 38 (H) 22 - 32 mmol/L   Glucose, Bld 142 (H) 70 - 99 mg/dL   BUN 21 (H) 6 - 20 mg/dL   Creatinine, Ser 0.95 0.61 - 1.24 mg/dL   Calcium 7.9 (L) 8.9 - 10.3 mg/dL   Total Protein 6.1 (L) 6.5 - 8.1 g/dL   Albumin 2.4 (L) 3.5 - 5.0 g/dL   AST 19 15 - 41 U/L   ALT 6 0 - 44 U/L   Alkaline Phosphatase 60 38 - 126 U/L   Total Bilirubin 0.4 0.3 - 1.2 mg/dL   GFR calc non Af Amer >60 >60 mL/min   GFR calc Af Amer >60  >60 mL/min   Anion gap 7 5 - 15    Comment: Performed at [facility], [place] [address] [place], [place] [address]  Lipase, blood     Status: None   Collection Time: 12/25/18  6:16 PM  Result Value Ref Range   Lipase 33 11 - 51 U/L    Comment: Performed at [facility], [place] [address], [place], [place] [address]  Lactic acid, plasma     Status: None   Collection Time: 12/25/18  6:16 PM  Result Value Ref Range   Lactic Acid, Venous 1.2 0.5 - 1.9 mmol/L    Comment: Performed at [facility], [place] [address], [place], [place] [address]  Urinalysis, Routine w reflex microscopic     Status: Abnormal   Collection Time: 12/25/18  7:31 PM  Result Value Ref Range   Color, Urine YELLOW YELLOW   APPearance CLOUDY (A) CLEAR   Specific Gravity, Urine 1.012 1.005 - 1.030   pH 8.0 5.0 - 8.0   Glucose, UA 50 (A) NEGATIVE mg/dL   Hgb urine dipstick MODERATE (A) NEGATIVE   Bilirubin Urine NEGATIVE NEGATIVE   Ketones, ur NEGATIVE NEGATIVE mg/dL   Protein, ur NEGATIVE NEGATIVE mg/dL   Nitrite NEGATIVE NEGATIVE   Leukocytes,Ua NEGATIVE NEGATIVE   RBC / HPF >50 (H) 0 - 5 RBC/hpf   WBC, UA 0-5 0 - 5 WBC/hpf   Bacteria, UA RARE (A) NONE SEEN   Squamous Epithelial / LPF 0-5 0 - 5   Mucus PRESENT     Comment: Performed at [facility], [place] [address], [place], [place] [address]   Dg Chest 2 View  Result Date: 12/25/2018 CLINICAL DATA:  Recent pneumonia. Patient was treated for empyema with VATS decortication in 2016. EXAM: CHEST - 2 VIEW COMPARISON:  12/13/2018 CXR and 12/06/2018 chest CT FINDINGS: Stable top-normal heart size. Aortic atherosclerosis at the arch without aneurysm. Central vascular congestion and interstitial edema is redemonstrated. Minimal chronic blunting the right lateral costophrenic angle either from pleural thickening or trace pleural fluid. Chronic pleural plaque is noted along the periphery of the right base.  Dense consolidation at the left lung base suspicious for atelectasis or  pneumonia. Redemonstration of chronic extrapleural cavity containing mostly air and small amount of fluid. Right-sided PICC line tip terminates at the cavoatrial junction. IMPRESSION: 1. Borderline cardiomegaly with central vascular congestion and interstitial edema. 2. Dense consolidation at the left lung base, suspicious for atelectasis and/or pneumonia. Redemonstration of chronic extrapulmonary, mostly air containing cavity with small amount of residual fluid and/or debris along the dependent aspect. Electronically Signed   By: Ashley Royalty M.D.   On: 12/25/2018 19:40   Ct Abdomen Pelvis W Contrast  Result Date: 12/25/2018 CLINICAL DATA:  Right flank and lower quadrant pain. EXAM: CT ABDOMEN AND PELVIS WITH CONTRAST TECHNIQUE: Multidetector CT imaging of the abdomen and pelvis was performed using the standard protocol following bolus administration of intravenous contrast. CONTRAST:  74m ISOVUE-300 IOPAMIDOL (ISOVUE-300) INJECTION 61% COMPARISON:  CT chest dated December 06, 2018. CT abdomen pelvis dated January 23, 2018. FINDINGS: Lower chest: Decreased consolidation in the right lower lobe with interval cavitation. Chronic loculated, complex pleural fluid collection at the left lung base with thick pleural enhancement. New component of air within the collection anteriorly when compared to prior study. Hepatobiliary: Unchanged liver hemangiomas. No new focal liver lesion. Gallbladder is unremarkable. No biliary dilatation. Pancreas: Unremarkable. No pancreatic ductal dilatation or surrounding inflammatory changes. Spleen: Normal in size. Unchanged peripheral calcifications likely due to old trauma. Adrenals/Urinary Tract: The adrenal glands are unremarkable. New patchy hypoenhancement of the right kidney, best appreciated on the delayed images. Delayed excretion of contrast from the right kidney. Mild right hydronephrosis with focal  ill-defined enhancement in the proximal right ureter. Unchanged bilateral renal cysts. The bladder is unremarkable. Stomach/Bowel: Stomach is within normal limits. Appendix appears normal. No evidence of bowel wall thickening, distention, or inflammatory changes. Vascular/Lymphatic: Aortic atherosclerosis. No enlarged abdominal or pelvic lymph nodes. Reproductive: Prostate is unremarkable. Other: Mild mesenteric edema and small ascites. No pneumoperitoneum. Musculoskeletal: Stable lytic lesion involving the L4 vertebral body. No new osseous lesions. IMPRESSION: 1. Findings consistent with right-sided pyelonephritis. Mild right hydronephrosis with focal ill-defined enhancement of the proximal right ureter that could reflect ascending urinary tract infection, although an underlying mass such as transitional cell carcinoma is not excluded. Correlation with cytology and ureteroscopy is recommended. 2. Resolving pneumonia in the right lower lobe with interval cavitation. 3. Chronic loculated, complex pleural fluid collection at the left lung base with increasing pleural gas. Electronically Signed   By: WTitus DubinM.D.   On: 12/25/2018 20:35   None  Pending Labs Unresulted Labs (From admission, onward)    Start     Ordered   12/25/18 1752  Lactic acid, plasma  Now then every 2 hours,   STAT     12/25/18 1754   12/25/18 1752  Urine culture  ONCE - STAT,   STAT     12/25/18 1754   12/25/18 1752  Blood culture (routine x 2)  BLOOD CULTURE X 2,   STAT     12/25/18 1754          Vitals/Pain Today's Vitals   12/25/18 2030 12/25/18 2056 12/25/18 2135 12/25/18 2209  BP: 138/87  127/77 122/76  Pulse: 67  67 66  Resp: (!) 21  20 (!) 24  Temp:      TempSrc:      SpO2: 100%  100% 100%  PainSc:  7       Isolation Precautions No active isolations  Medications Medications  iohexol (OMNIPAQUE) 300 MG/ML solution 30 mL (has no administration in time range)  iopamidol (ISOVUE-300) 61 % injection  (has no administration in time range)  sodium chloride (PF) 0.9 % injection (has no administration in time range)  morphine 2 MG/ML injection 2-4 mg (has no administration in time range)  aspirin EC tablet 81 mg (has no administration in time range)  predniSONE (DELTASONE) tablet 10-40 mg (has no administration in time range)  albuterol (PROVENTIL HFA;VENTOLIN HFA) 108 (90 Base) MCG/ACT inhaler 2 puff (has no administration in time range)  guaiFENesin (MUCINEX) 12 hr tablet 1,200 mg (has no administration in time range)  Fluticasone-Umeclidin-Vilant 100-62.5-25 MCG/INH AEPB 1 puff (has no administration in time range)  acyclovir (ZOVIRAX) tablet 400 mg (has no administration in time range)  ceFAZolin (ANCEF) IVPB 2g/100 mL premix (has no administration in time range)  acetaminophen (TYLENOL) tablet 650 mg (has no administration in time range)    Or  acetaminophen (TYLENOL) suppository 650 mg (has no administration in time range)  ondansetron (ZOFRAN) tablet 4 mg (has no administration in time range)    Or  ondansetron (ZOFRAN) injection 4 mg (has no administration in time range)  enoxaparin (LOVENOX) injection 40 mg (has no administration in time range)  morphine 4 MG/ML injection 4 mg (4 mg Intravenous Given 12/25/18 1843)  sodium chloride 0.9 % bolus 1,000 mL (1,000 mLs Intravenous New Bag/Given 12/25/18 1843)  iopamidol (ISOVUE-300) 61 % injection 100 mL (75 mLs Intravenous Contrast Given 12/25/18 1948)  morphine 4 MG/ML injection 4 mg (4 mg Intravenous Given 12/25/18 2233)  ondansetron (ZOFRAN) injection 4 mg (4 mg Intravenous Given 12/25/18 2233)    Mobility walks

## 2018-12-25 NOTE — ED Triage Notes (Signed)
Per EMS-states he was discharged 6 days ago for same symptoms-receives IV antibiotics thru PICC line-history of chronic kidney stones-[facility] RN called EMS, who noticed 3 days ago that is demeanor changed-patient doesn't have PCP due to language barrier per [facility] RN

## 2018-12-25 NOTE — ED Notes (Signed)
Pt made aware of need for urine sample.  

## 2018-12-25 NOTE — H&P (Signed)
History and Physical    [name] [name] KWI:[id number] DOB: 04/21/1961 DOA: 12/25/2018  PCP: [name], PA-C  Patient coming from: Home  I have personally briefly reviewed patient's old medical records in [place]  Chief Complaint: R flank pain  HPI: [name] [name] is a 58 y.o. male with medical history significant of multiple myeloma on Revlimid,  pulmonary hypertension, chronic hypoxic respiratory failure, chronic left-sided hydropneumothorax, bronchiectasis, allergic bronchopulmonary aspergillosis, heterozygous alpha 1 antitrypsin deficiency, originally was treated for empyema with VATS decortication in 2016.  Patient was recently admitted from 2/2 to 2/15 with PNA and bacteremia due to MSSA.  He was discharged on Ancef through PICC line to complete on 2/22, Revlimid has been put on hold.  He is also on a prednisone taper.  Today the patient presents to the ED with c/o R flank and RLQ abd pain.  Pain is 10/10 in severity.  Onset today.  Persistent.  Also reports chills, fatigue, and cough ongoing since discharge.  ED Course: Found to have R sided hydronephrosis, no stone seen, ? Mass as cause?  UA neg for infection, does show RBCs.  CT was suspicious for pyelonephritis.  Urology did not feel there was likely a pyelonephritis but instead had some irritation from the stenosis.  They feel that if the patient is able to maintain his pain management and can stay hydrated he would be safe for outpatient urology follow-up and likely ureteroscopy to look for mass or stenosis.  If patient was unable to tolerate p.o. and had uncontrolled pain, they recommended admission for further monitoring.  Hospitalist asked to admit for pain control.   Review of Systems: As per HPI otherwise 10 point review of systems negative.   Past Medical History:  Diagnosis Date  . Acute on chronic respiratory failure ([place]) 02/07/2018  . Hypertension   . Kidney stones   . Multiple myeloma ([place])   .  Obstructive lung disease (generalized) ([place])   . Pulmonary hypertension ([place])     Past Surgical History:  Procedure Laterality Date  . CARDIAC CATHETERIZATION N/A 01/18/2016   Procedure: Right Heart Cath;  Surgeon: Jolaine Artist, MD;  Location: [facility];  Service: Cardiovascular;  Laterality: N/A;  . CARDIAC CATHETERIZATION N/A 06/24/2016   Procedure: Right Heart Cath;  Surgeon: Jolaine Artist, MD;  Location: [facility];  Service: Cardiovascular;  Laterality: N/A;  . IR RADIOLOGIST EVAL & MGMT  05/27/2018  . IR RADIOLOGIST EVAL & MGMT  09/01/2018  . KIDNEY SURGERY    . VIDEO ASSISTED THORACOSCOPY (VATS)/DECORTICATION Left 2003  . VIDEO ASSISTED THORACOSCOPY (VATS)/DECORTICATION Left 10/02/2015   Procedure: VIDEO ASSISTED THORACOSCOPY (VATS)/DECORTICATION and drainage of chronic empyena;  Surgeon: [name], MD;  Location: [facility];  Service: Thoracic;  Laterality: Left;  Marland Kitchen VIDEO BRONCHOSCOPY N/A 10/02/2015   Procedure: VIDEO BRONCHOSCOPY;  Surgeon: [name], MD;  Location: [facility];  Service: Thoracic;  Laterality: N/A;     reports that he has never smoked. He has never used smokeless tobacco. He reports current alcohol use. He reports that he does not use drugs.  No Known Allergies  Family History  Problem Relation Age of Onset  . Hypertension Other      Prior to Admission medications   Medication Sig Start Date End Date Taking? Authorizing Provider  aspirin EC 81 MG tablet Take 1 tablet (81 mg total) by mouth daily. 11/18/18  Yes Brunetta Genera, MD  ceFAZolin (ANCEF) IVPB Inject  2 g into the vein every 8 (eight) hours for 9 days. Indication:  MSSA bacteremia Last Day of Therapy:  12/26/2018 Labs - Once weekly:  CBC/D and BMP, 12/18/18 12/27/18 Yes Donne Hazel, MD  Fluticasone-Umeclidin-Vilant (TRELEGY ELLIPTA) 100-62.5-25 MCG/INH AEPB Inhale 1 puff into the lungs daily. 07/31/18  Yes Tanda Rockers, MD  guaiFENesin (MUCINEX) 600 MG 12 hr  tablet Take 2 tablets (1,200 mg total) by mouth 2 (two) times daily. 09/05/18  Yes Oswald Hillock, MD  OXYGEN 3L BEDTIME ONLY   Yes [provider]  predniSONE (DELTASONE) 10 MG tablet Take 1 tablet (10 mg total) by mouth daily. Taper dose: '60mg'$  po daily x 3 days, then '40mg'$  po daily x 3 days, then '20mg'$  po daily x 3 days, then '10mg'$  po daily x 3 days, then '5mg'$  po daily x 2 days, then stop and resume decadron as previously prescribed 12/19/18  Yes Donne Hazel, MD  PROVENTIL HFA 108 (90 Base) MCG/ACT inhaler INHALE 2 PUFFS EVERY 6 HOURS AS NEEDED FOR WHEEZING OR SHORTNESS OF BREATH. 10/20/18  Yes Juanito Doom, MD  acyclovir (ZOVIRAX) 400 MG tablet Take 1 tablet (400 mg total) by mouth daily. 11/18/18   Brunetta Genera, MD  dexamethasone (DECADRON) 4 MG tablet Take 5 tablets (20 mg total) by mouth once a week. 11/18/18   Brunetta Genera, MD  docusate sodium (COLACE) 50 MG capsule Take 1 capsule (50 mg total) by mouth daily. Patient not taking: Reported on 12/25/2018 03/06/18   [name], PA-C    Physical Exam: Vitals:   12/25/18 2026 12/25/18 2030 12/25/18 2135 12/25/18 2209  BP: 139/86 138/87 127/77 122/76  Pulse: 66 67 67 66  Resp: 19 (!) 21 20 (!) 24  Temp:      TempSrc:      SpO2: 100% 100% 100% 100%    Constitutional: NAD, calm, comfortable Eyes: PERRL, lids and conjunctivae normal ENMT: Mucous membranes are moist. Posterior pharynx clear of any exudate or lesions.Normal dentition.  Neck: normal, supple, no masses, no thyromegaly Respiratory: clear to auscultation bilaterally, no wheezing, no crackles. Normal respiratory effort. No accessory muscle use.  Cardiovascular: Regular rate and rhythm, no murmurs / rubs / gallops. No extremity edema. 2+ pedal pulses. No carotid bruits.  Abdomen: no tenderness, no masses palpated. No hepatosplenomegaly. Bowel sounds positive.  Musculoskeletal: no clubbing / cyanosis. No joint deformity upper and lower extremities.  Good ROM, no contractures. Normal muscle tone.  Skin: no rashes, lesions, ulcers. No induration Neurologic: CN 2-12 grossly intact. Sensation intact, DTR normal. Strength 5/5 in all 4.  Psychiatric: Normal judgment and insight. Alert and oriented x 3. Normal mood.    Labs on Admission: I have personally reviewed following labs and imaging studies  CBC: Recent Labs  Lab 12/25/18 1816  WBC 9.6  NEUTROABS 8.8*  HGB 8.5*  HCT 27.2*  MCV 80.0  PLT 629   Basic Metabolic Panel: Recent Labs  Lab 12/25/18 1816  NA 135  K 4.0  CL 90*  CO2 38*  GLUCOSE 142*  BUN 21*  CREATININE 0.95  CALCIUM 7.9*   GFR: Estimated Creatinine Clearance: 50.6 mL/min (by C-G formula based on SCr of 0.95 mg/dL). Liver Function Tests: Recent Labs  Lab 12/25/18 1816  AST 19  ALT 6  ALKPHOS 60  BILITOT 0.4  PROT 6.1*  ALBUMIN 2.4*   Recent Labs  Lab 12/25/18 1816  LIPASE 33   No results for input(s): AMMONIA in  the last 168 hours. Coagulation Profile: No results for input(s): INR, PROTIME in the last 168 hours. Cardiac Enzymes: No results for input(s): CKTOTAL, CKMB, CKMBINDEX, TROPONINI in the last 168 hours. BNP (last 3 results) No results for input(s): PROBNP in the last 8760 hours. HbA1C: No results for input(s): HGBA1C in the last 72 hours. CBG: Recent Labs  Lab 12/19/18 0752 12/19/18 1118  GLUCAP 103* 143*   Lipid Profile: No results for input(s): CHOL, HDL, LDLCALC, TRIG, CHOLHDL, LDLDIRECT in the last 72 hours. Thyroid Function Tests: No results for input(s): TSH, T4TOTAL, FREET4, T3FREE, THYROIDAB in the last 72 hours. Anemia Panel: No results for input(s): VITAMINB12, FOLATE, FERRITIN, TIBC, IRON, RETICCTPCT in the last 72 hours. Urine analysis:    Component Value Date/Time   COLORURINE YELLOW 12/25/2018 1931   APPEARANCEUR CLOUDY (A) 12/25/2018 1931   LABSPEC 1.012 12/25/2018 1931   PHURINE 8.0 12/25/2018 1931   GLUCOSEU 50 (A) 12/25/2018 1931   HGBUR MODERATE  (A) 12/25/2018 1931   BILIRUBINUR NEGATIVE 12/25/2018 1931   KETONESUR NEGATIVE 12/25/2018 1931   PROTEINUR NEGATIVE 12/25/2018 1931   UROBILINOGEN 0.2 11/11/2012 0145   NITRITE NEGATIVE 12/25/2018 1931   LEUKOCYTESUR NEGATIVE 12/25/2018 1931    Radiological Exams on Admission: Dg Chest 2 View  Result Date: 12/25/2018 CLINICAL DATA:  Recent pneumonia. Patient was treated for empyema with VATS decortication in 2016. EXAM: CHEST - 2 VIEW COMPARISON:  12/13/2018 CXR and 12/06/2018 chest CT FINDINGS: Stable top-normal heart size. Aortic atherosclerosis at the arch without aneurysm. Central vascular congestion and interstitial edema is redemonstrated. Minimal chronic blunting the right lateral costophrenic angle either from pleural thickening or trace pleural fluid. Chronic pleural plaque is noted along the periphery of the right base. Dense consolidation at the left lung base suspicious for atelectasis or pneumonia. Redemonstration of chronic extrapleural cavity containing mostly air and small amount of fluid. Right-sided PICC line tip terminates at the cavoatrial junction. IMPRESSION: 1. Borderline cardiomegaly with central vascular congestion and interstitial edema. 2. Dense consolidation at the left lung base, suspicious for atelectasis and/or pneumonia. Redemonstration of chronic extrapulmonary, mostly air containing cavity with small amount of residual fluid and/or debris along the dependent aspect. Electronically Signed   By: Ashley Royalty M.D.   On: 12/25/2018 19:40   Ct Abdomen Pelvis W Contrast  Result Date: 12/25/2018 CLINICAL DATA:  Right flank and lower quadrant pain. EXAM: CT ABDOMEN AND PELVIS WITH CONTRAST TECHNIQUE: Multidetector CT imaging of the abdomen and pelvis was performed using the standard protocol following bolus administration of intravenous contrast. CONTRAST:  63m ISOVUE-300 IOPAMIDOL (ISOVUE-300) INJECTION 61% COMPARISON:  CT chest dated December 06, 2018. CT abdomen pelvis  dated January 23, 2018. FINDINGS: Lower chest: Decreased consolidation in the right lower lobe with interval cavitation. Chronic loculated, complex pleural fluid collection at the left lung base with thick pleural enhancement. New component of air within the collection anteriorly when compared to prior study. Hepatobiliary: Unchanged liver hemangiomas. No new focal liver lesion. Gallbladder is unremarkable. No biliary dilatation. Pancreas: Unremarkable. No pancreatic ductal dilatation or surrounding inflammatory changes. Spleen: Normal in size. Unchanged peripheral calcifications likely due to old trauma. Adrenals/Urinary Tract: The adrenal glands are unremarkable. New patchy hypoenhancement of the right kidney, best appreciated on the delayed images. Delayed excretion of contrast from the right kidney. Mild right hydronephrosis with focal ill-defined enhancement in the proximal right ureter. Unchanged bilateral renal cysts. The bladder is unremarkable. Stomach/Bowel: Stomach is within normal limits. Appendix appears normal. No evidence of  bowel wall thickening, distention, or inflammatory changes. Vascular/Lymphatic: Aortic atherosclerosis. No enlarged abdominal or pelvic lymph nodes. Reproductive: Prostate is unremarkable. Other: Mild mesenteric edema and small ascites. No pneumoperitoneum. Musculoskeletal: Stable lytic lesion involving the L4 vertebral body. No new osseous lesions. IMPRESSION: 1. Findings consistent with right-sided pyelonephritis. Mild right hydronephrosis with focal ill-defined enhancement of the proximal right ureter that could reflect ascending urinary tract infection, although an underlying mass such as transitional cell carcinoma is not excluded. Correlation with cytology and ureteroscopy is recommended. 2. Resolving pneumonia in the right lower lobe with interval cavitation. 3. Chronic loculated, complex pleural fluid collection at the left lung base with increasing pleural gas.  Electronically Signed   By: Titus Dubin M.D.   On: 12/25/2018 20:35    EKG: Independently reviewed.  Assessment/Plan Principal Problem:   Hydronephrosis of right kidney Active Problems:   Pleural effusion, left   Chronic respiratory failure with hypoxia (HCC)   Multiple myeloma (HCC)   Alpha-1-antitrypsin deficiency (HCC)   Bacteremia due to methicillin susceptible Staphylococcus aureus (MSSA)    1. Hydronephrosis of R kidney - 1. EDP spoke with urology: Urology did not feel there was likely a pyelonephritis but instead had some irritation from the stenosis.  They feel that if the patient is able to maintain his pain management and can stay hydrated he would be safe for outpatient urology follow-up and likely ureteroscopy to look for mass or stenosis.  If patient was unable to tolerate p.o. and had uncontrolled pain, they recommended admission for further monitoring. 2. Pain control with morphine PRN 3. Watch for development of SIRS, call urology again if patient becomes increasingly ill overnight. 4. Otherwise touch base with urology in AM. 2. MSSA bacteremia and RLL PNA- 1. Continue treatment with IV Ancef 2. Continue prednisone taper 3. L pleural effusion - 1. Chronic, some more gas than previous on CT today but not clear that this is significantly changed. 4. Chronic resp failure with hypoxia - wears O2 at night 5. MM - 1. Revlemid on hold 2. Continue prednisone taper as above 3. Continue acyclovir  DVT prophylaxis: Lovenox Code Status: Full Family Communication: Family at bedside Disposition Plan: Home after admit Consults called: EDP spoke with urology Admission status: Place in 65    [place], [place] [facility]  How to contact the [facility] Attending or Consulting provider [place] or covering provider during after hours [place], for this patient?  1. Check the care team in [facility] and look for a) attending/consulting [facility] provider listed and b) the [facility] team  listed 2. Log into [email or web address]  Amion Physician Scheduling and messaging for groups and whole hospitals  On call and physician scheduling software for group practices, residents, hospitalists and other medical providers for call, clinic, rotation and shift schedules. On[employer] is a hospital-wide system for scheduling doctors and paging doctors on call. EasyPlot is for scientific plotting and data analysis.  [email or web address]  and use [facility]'s universal password to access. If you do not have the password, please contact the hospital operator.  3. Locate the [facility] provider you are looking for under [facility] and page to a number that you can be directly reached. 4. If you still have difficulty reaching the provider, please page the [facility] (Director on Call) for the Hospitalists listed on amion for assistance.  12/25/2018, 10:43 PM

## 2018-12-25 NOTE — ED Provider Notes (Signed)
[facility] DEPT Provider Note   CSN: [id number] Arrival date & time: 12/25/18  1705    History   Chief Complaint Chief Complaint  Patient presents with  . possible kidney stone    HPI [name] [name] is a 58 y.o. male.     The history is provided by the patient and medical records. No language interpreter was used.  Abdominal Pain  Pain location:  R flank and RLQ Pain quality: aching and sharp   Pain radiates to:  R flank and back Onset quality:  Gradual Duration:  1 day Timing:  Constant Progression:  Unchanged Chronicity:  New Context: recent illness   Relieved by:  Nothing Worsened by:  Nothing Ineffective treatments:  None tried Associated symptoms: chills, cough and fatigue   Associated symptoms: no chest pain, no constipation, no diarrhea, no dysuria, no fever, no nausea, no shortness of breath and no vomiting   Risk factors: recent hospitalization     Past Medical History:  Diagnosis Date  . Acute on chronic respiratory failure ([place]) 02/07/2018  . Hypertension   . Kidney stones   . Multiple myeloma ([place])   . Obstructive lung disease (generalized) ([place])   . Pulmonary hypertension [facility])     Patient Active Problem List   Diagnosis Date Noted  . Acute respiratory distress   . Alpha-1-antitrypsin deficiency ([place]) 12/07/2018  . Bronchiectasis ([place]) 12/07/2018  . Bacteremia due to methicillin susceptible Staphylococcus aureus (MSSA) 12/07/2018  . Sepsis ([place]) 12/06/2018  . Protein-calorie malnutrition, severe 09/05/2018  . Community acquired pneumonia 09/03/2018  . Grade II diastolic dysfunction 84/69/6295  . Acute on chronic respiratory failure with hypoxia ([place]) 01/27/2018  . Counseling regarding advanced care planning and goals of care 11/08/2017  . Multiple myeloma ([place]) 11/07/2017  . Chronic respiratory failure with hypoxia ([place]) 03/26/2016  . COPD GOLD IV criteria but never smoked  03/11/2016  . Pulmonary HTN ([place])   .  Pleural effusion, left 09/26/2015  . Viral hepatitis C 08/06/2015    Past Surgical History:  Procedure Laterality Date  . CARDIAC CATHETERIZATION N/A 01/18/2016   Procedure: Right Heart Cath;  Surgeon: Jolaine Artist, MD;  Location: [place] CV LAB;  Service: Cardiovascular;  Laterality: N/A;  . CARDIAC CATHETERIZATION N/A 06/24/2016   Procedure: Right Heart Cath;  Surgeon: Jolaine Artist, MD;  Location: [place] CV LAB;  Service: Cardiovascular;  Laterality: N/A;  . IR RADIOLOGIST EVAL & MGMT  05/27/2018  . IR RADIOLOGIST EVAL & MGMT  09/01/2018  . KIDNEY SURGERY    . VIDEO ASSISTED THORACOSCOPY (VATS)/DECORTICATION Left 2003  . VIDEO ASSISTED THORACOSCOPY (VATS)/DECORTICATION Left 10/02/2015   Procedure: VIDEO ASSISTED THORACOSCOPY (VATS)/DECORTICATION and drainage of chronic empyena;  Surgeon: [name], MD;  Location: [facility];  Service: Thoracic;  Laterality: Left;  Marland Kitchen VIDEO BRONCHOSCOPY N/A 10/02/2015   Procedure: VIDEO BRONCHOSCOPY;  Surgeon: [name], MD;  Location: [facility] OR;  Service: Thoracic;  Laterality: N/A;        Home Medications    Prior to Admission medications   Medication Sig Start Date End Date Taking? Authorizing Provider  acyclovir (ZOVIRAX) 400 MG tablet Take 1 tablet (400 mg total) by mouth daily. 11/18/18   Brunetta Genera, MD  aspirin EC 81 MG tablet Take 1 tablet (81 mg total) by mouth daily. 11/18/18   Brunetta Genera, MD  ceFAZolin (ANCEF) IVPB Inject 2 g into the vein every 8 (eight) hours for 9 days. Indication:  MSSA  bacteremia Last Day of Therapy:  12/26/2018 Labs - Once weekly:  CBC/D and BMP, 12/18/18 12/27/18  Donne Hazel, MD  dexamethasone (DECADRON) 4 MG tablet Take 5 tablets (20 mg total) by mouth once a week. 11/18/18   Brunetta Genera, MD  docusate sodium (COLACE) 50 MG capsule Take 1 capsule (50 mg total) by mouth daily. 03/06/18   [name], PA-C  Fluticasone-Umeclidin-Vilant (TRELEGY ELLIPTA)  100-62.5-25 MCG/INH AEPB Inhale 1 puff into the lungs daily. 07/31/18   Tanda Rockers, MD  guaiFENesin (MUCINEX) 600 MG 12 hr tablet Take 2 tablets (1,200 mg total) by mouth 2 (two) times daily. Patient not taking: Reported on 12/06/2018 09/05/18   Oswald Hillock, MD  omeprazole (PRILOSEC) 40 MG capsule Take 1 capsule (40 mg total) by mouth daily. 08/20/18   Brunetta Genera, MD  ondansetron (ZOFRAN) 8 MG tablet Take 1 tablet (8 mg total) by mouth every 8 (eight) hours as needed for nausea. Patient not taking: Reported on 12/06/2018 09/23/18   Brunetta Genera, MD  oxyCODONE-acetaminophen (PERCOCET/ROXICET) 5-325 MG tablet Take 1 tablet by mouth every 6 (six) hours as needed for severe pain. 08/27/18   Brunetta Genera, MD  OXYGEN 3L BEDTIME ONLY    [provider]  predniSONE (DELTASONE) 10 MG tablet Take 1 tablet (10 mg total) by mouth daily. Taper dose: '60mg'$  po daily x 3 days, then '40mg'$  po daily x 3 days, then '20mg'$  po daily x 3 days, then '10mg'$  po daily x 3 days, then '5mg'$  po daily x 2 days, then stop and resume decadron as previously prescribed 12/19/18   Donne Hazel, MD  PROVENTIL HFA 108 [phone] Base) MCG/ACT inhaler INHALE 2 PUFFS EVERY 6 HOURS AS NEEDED FOR WHEEZING OR SHORTNESS OF BREATH. 10/20/18   Juanito Doom, MD    Family History Family History  Problem Relation Age of Onset  . Hypertension Other     Social History Social History   Tobacco Use  . Smoking status: Never Smoker  . Smokeless tobacco: Never Used  Substance Use Topics  . Alcohol use: Yes  . Drug use: No     Allergies   Patient has no known allergies.   Review of Systems Review of Systems  Constitutional: Positive for chills and fatigue. Negative for diaphoresis and fever.  HENT: Negative for congestion.   Respiratory: Positive for cough. Negative for chest tightness, shortness of breath, wheezing and stridor.   Cardiovascular: Negative for chest pain, palpitations and leg swelling.    Gastrointestinal: Positive for abdominal pain. Negative for constipation, diarrhea, nausea and vomiting.  Genitourinary: Negative for dysuria and frequency.  Musculoskeletal: Positive for back pain. Negative for neck pain and neck stiffness.  Skin: Negative for rash and wound.  Neurological: Negative for light-headedness and headaches.  Psychiatric/Behavioral: Positive for confusion. Negative for agitation.  All other systems reviewed and are negative.    Physical Exam Updated Vital Signs BP (!) 149/81 (BP Location: Left Arm)   Pulse 67   Temp 98.9 F (37.2 C) (Oral)   Resp (!) 32   SpO2 100%   Physical Exam Vitals signs and nursing note reviewed.  Constitutional:      General: He is not in acute distress.    Appearance: He is well-developed. He is not ill-appearing, toxic-appearing or diaphoretic.  HENT:     Head: Normocephalic and atraumatic.     Nose: Nose normal. No congestion or rhinorrhea.     Mouth/Throat:  Pharynx: No oropharyngeal exudate or posterior oropharyngeal erythema.  Eyes:     Conjunctiva/sclera: Conjunctivae normal.     Pupils: Pupils are equal, round, and reactive to light.  Neck:     Musculoskeletal: Neck supple.  Cardiovascular:     Rate and Rhythm: Normal rate and regular rhythm.     Heart sounds: No murmur.  Pulmonary:     Effort: Pulmonary effort is normal. No respiratory distress.     Breath sounds: Rhonchi present. No wheezing or rales.  Chest:     Chest wall: No tenderness.  Abdominal:     General: Bowel sounds are normal. There is no distension.     Palpations: Abdomen is soft.     Tenderness: There is abdominal tenderness. There is right CVA tenderness. There is no left CVA tenderness.    Musculoskeletal:        General: Tenderness present. No signs of injury.     Thoracic back: He exhibits tenderness and pain.       Back:     Right lower leg: No edema.     Left lower leg: No edema.  Skin:    General: Skin is warm and dry.      Capillary Refill: Capillary refill takes less than 2 seconds.     Findings: No erythema or rash.  Neurological:     General: No focal deficit present.     Mental Status: He is alert.      ED Treatments / Results  Labs (all labs ordered are listed, but only abnormal results are displayed) Labs Reviewed  CBC WITH DIFFERENTIAL/PLATELET - Abnormal; Notable for the following components:      Result Value   RBC 3.40 (*)    Hemoglobin 8.5 (*)    HCT 27.2 (*)    MCH 25.0 (*)    RDW 16.9 (*)    Neutro Abs 8.8 (*)    Lymphs Abs 0.2 (*)    All other components within normal limits  COMPREHENSIVE METABOLIC PANEL - Abnormal; Notable for the following components:   Chloride 90 (*)    CO2 38 (*)    Glucose, Bld 142 (*)    BUN 21 (*)    Calcium 7.9 (*)    Total Protein 6.1 (*)    Albumin 2.4 (*)    All other components within normal limits  URINALYSIS, ROUTINE W REFLEX MICROSCOPIC - Abnormal; Notable for the following components:   APPearance CLOUDY (*)    Glucose, UA 50 (*)    Hgb urine dipstick MODERATE (*)    RBC / HPF >50 (*)    Bacteria, UA RARE (*)    All other components within normal limits  URINE CULTURE  CULTURE, BLOOD (ROUTINE X 2)  CULTURE, BLOOD (ROUTINE X 2)  LIPASE, BLOOD  LACTIC ACID, PLASMA  LACTIC ACID, PLASMA    EKG None  Radiology Dg Chest 2 View  Result Date: 12/25/2018 CLINICAL DATA:  Recent pneumonia. Patient was treated for empyema with VATS decortication in 2016. EXAM: CHEST - 2 VIEW COMPARISON:  12/13/2018 CXR and 12/06/2018 chest CT FINDINGS: Stable top-normal heart size. Aortic atherosclerosis at the arch without aneurysm. Central vascular congestion and interstitial edema is redemonstrated. Minimal chronic blunting the right lateral costophrenic angle either from pleural thickening or trace pleural fluid. Chronic pleural plaque is noted along the periphery of the right base. Dense consolidation at the left lung base suspicious for atelectasis or  pneumonia. Redemonstration of chronic extrapleural cavity containing mostly air  and small amount of fluid. Right-sided PICC line tip terminates at the cavoatrial junction. IMPRESSION: 1. Borderline cardiomegaly with central vascular congestion and interstitial edema. 2. Dense consolidation at the left lung base, suspicious for atelectasis and/or pneumonia. Redemonstration of chronic extrapulmonary, mostly air containing cavity with small amount of residual fluid and/or debris along the dependent aspect. Electronically Signed   By: Ashley Royalty M.D.   On: 12/25/2018 19:40   Ct Abdomen Pelvis W Contrast  Result Date: 12/25/2018 CLINICAL DATA:  Right flank and lower quadrant pain. EXAM: CT ABDOMEN AND PELVIS WITH CONTRAST TECHNIQUE: Multidetector CT imaging of the abdomen and pelvis was performed using the standard protocol following bolus administration of intravenous contrast. CONTRAST:  44m ISOVUE-300 IOPAMIDOL (ISOVUE-300) INJECTION 61% COMPARISON:  CT chest dated December 06, 2018. CT abdomen pelvis dated January 23, 2018. FINDINGS: Lower chest: Decreased consolidation in the right lower lobe with interval cavitation. Chronic loculated, complex pleural fluid collection at the left lung base with thick pleural enhancement. New component of air within the collection anteriorly when compared to prior study. Hepatobiliary: Unchanged liver hemangiomas. No new focal liver lesion. Gallbladder is unremarkable. No biliary dilatation. Pancreas: Unremarkable. No pancreatic ductal dilatation or surrounding inflammatory changes. Spleen: Normal in size. Unchanged peripheral calcifications likely due to old trauma. Adrenals/Urinary Tract: The adrenal glands are unremarkable. New patchy hypoenhancement of the right kidney, best appreciated on the delayed images. Delayed excretion of contrast from the right kidney. Mild right hydronephrosis with focal ill-defined enhancement in the proximal right ureter. Unchanged bilateral renal  cysts. The bladder is unremarkable. Stomach/Bowel: Stomach is within normal limits. Appendix appears normal. No evidence of bowel wall thickening, distention, or inflammatory changes. Vascular/Lymphatic: Aortic atherosclerosis. No enlarged abdominal or pelvic lymph nodes. Reproductive: Prostate is unremarkable. Other: Mild mesenteric edema and small ascites. No pneumoperitoneum. Musculoskeletal: Stable lytic lesion involving the L4 vertebral body. No new osseous lesions. IMPRESSION: 1. Findings consistent with right-sided pyelonephritis. Mild right hydronephrosis with focal ill-defined enhancement of the proximal right ureter that could reflect ascending urinary tract infection, although an underlying mass such as transitional cell carcinoma is not excluded. Correlation with cytology and ureteroscopy is recommended. 2. Resolving pneumonia in the right lower lobe with interval cavitation. 3. Chronic loculated, complex pleural fluid collection at the left lung base with increasing pleural gas. Electronically Signed   By: WTitus DubinM.D.   On: 12/25/2018 20:35    Procedures Procedures (including critical care time)  Medications Ordered in ED Medications  iohexol (OMNIPAQUE) 300 MG/ML solution 30 mL (has no administration in time range)  iopamidol (ISOVUE-300) 61 % injection (has no administration in time range)  sodium chloride (PF) 0.9 % injection (has no administration in time range)  morphine 2 MG/ML injection 2-4 mg (has no administration in time range)  aspirin EC tablet 81 mg (has no administration in time range)  predniSONE (DELTASONE) tablet 10-40 mg (has no administration in time range)  albuterol (PROVENTIL HFA;VENTOLIN HFA) 108 (90 Base) MCG/ACT inhaler 2 puff (has no administration in time range)  guaiFENesin (MUCINEX) 12 hr tablet 1,200 mg (has no administration in time range)  Fluticasone-Umeclidin-Vilant 100-62.5-25 MCG/INH AEPB 1 puff (has no administration in time range)    acyclovir (ZOVIRAX) tablet 400 mg (has no administration in time range)  ceFAZolin (ANCEF) IVPB 2g/100 mL premix (has no administration in time range)  acetaminophen (TYLENOL) tablet 650 mg (has no administration in time range)    Or  acetaminophen (TYLENOL) suppository 650 mg (has no administration in  time range)  ondansetron (ZOFRAN) tablet 4 mg (has no administration in time range)    Or  ondansetron (ZOFRAN) injection 4 mg (has no administration in time range)  enoxaparin (LOVENOX) injection 40 mg (has no administration in time range)  morphine 4 MG/ML injection 4 mg (4 mg Intravenous Given 12/25/18 1843)  sodium chloride 0.9 % bolus 1,000 mL (1,000 mLs Intravenous New Bag/Given 12/25/18 1843)  iopamidol (ISOVUE-300) 61 % injection 100 mL (75 mLs Intravenous Contrast Given 12/25/18 1948)  morphine 4 MG/ML injection 4 mg (4 mg Intravenous Given 12/25/18 2233)  ondansetron (ZOFRAN) injection 4 mg (4 mg Intravenous Given 12/25/18 2233)     Initial Impression / Assessment and Plan / ED Course  I have reviewed the triage vital signs and the nursing notes.  Pertinent labs & imaging results that were available during my care of the patient were reviewed by me and considered in my medical decision making (see chart for details).        [name] [name] is a 58 y.o. male with a past medical history significant for alpha-1 antitrypsin deficiency, multiple myeloma, COPD on home oxygen, pulmonary hypertension, prior kidney stones, obstructive lung disease, and recent mission for sepsis with pneumonia and bacteremia with PICC line in place getting Ancef at home who presents with chills, fatigue, cough, and new right sided abdominal and flank pain.  Patient is coming by family who provided interpretation services.  They report that patient was discharged from the hospital last week and today started having worsened right abdominal pain.  Pain is primarily his right lower quadrant but also is in his right flank  and back.  He does have history of kidney stones but he says this feels different than a kidney stone.  He reports the pain is primarily in the right lower quadrant.  He has no history of appendicitis or appendix surgery.  He reports no nausea, vomiting, constipation or diarrhea.  He reports the pain is 10 out of 10 in severity.  He denies recent trauma.  He reports he is continued to have cough and chills and fatigue.  He presents for further evaluation.  On exam, right CVA is tender as is right flank.  Patient does have right lower quadrant tenderness.  Other abdomen nontender.  Patient refused GU exam initially.  Lungs had coarse breath sounds bilaterally.  No significant wheezing.  Chest nontender.  Clinically I am concerned about pyelonephritis versus appendicitis versus a kidney stone.  I am also concerned about continued pneumonia.  Patient chest x-ray and CT abdomen pelvis.  He will given pain medicine and fluids.  He will have labs.  Anticipate reassessment after work-up.  10:03 PM Patient had work-up showing no nitrites or leukocytes in the urine.  There was hemoglobin and red blood cells.  Also bacteria.  Laboratory seen otherwise showed normal lactic acid and lipase.  CMP showed mild hypo-Cal C. Mia but otherwise had normal kidney function.  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  Malignancy was a concern for the stenosis.  Spoke with urology with concern if there is an infection behind the stenosis.  Urology did not feel there was likely a pyelonephritis but instead had some irritation from the stenosis.  They feel that if the patient is able to maintain his pain management and can stay hydrated he would be safe for outpatient urology follow-up and likely ureteroscopy to look for mass  or stenosis.  If patient was unable to tolerate p.o. and had uncontrolled pain, they recommended admission for  further monitoring.  CT also showed some evolution in his bilateral pneumonias with some improving in 1 having increased gas.  Patient was reassessed after work-up and still had 10 out of 10 pain with nausea.  He says he could not eat and had uncontrolled pain with some tachycardia.  Patient will be given more fluids and nausea medicine.  Due to uncontrolled pain, patient be admitted for further management of likely right-sided kidney pain causing symptoms.  Medicine team will be called for admission for further pain management, rehydration, continued treatment of his pneumonias, and for likely urology evaluation tomorrow.    Final Clinical Impressions(s) / ED Diagnoses   Final diagnoses:  Right flank pain  RLQ abdominal pain  Nausea  Hydronephrosis, right    ED Discharge Orders    None      Clinical Impression: 1. Right flank pain   2. RLQ abdominal pain   3. Nausea   4. Hydronephrosis, right     Disposition: Admit  This note was prepared with assistance of Dragon voice recognition software. Occasional wrong-word or sound-a-like substitutions may have occurred due to the inherent limitations of voice recognition software.         Rei Contee, Gwenyth Allegra, MD 12/25/18 [phone]

## 2018-12-25 NOTE — ED Notes (Signed)
Pt on 3L here and at home.

## 2018-12-25 NOTE — ED Notes (Signed)
Bed: XB84 Expected date:  Expected time:  Means of arrival:  Comments: EMS-flank pain/MRSA

## 2018-12-26 ENCOUNTER — Other Ambulatory Visit: Payer: Self-pay

## 2018-12-26 DIAGNOSIS — N1330 Unspecified hydronephrosis: Secondary | ICD-10-CM | POA: Diagnosis not present

## 2018-12-26 LAB — LACTIC ACID, PLASMA: Lactic Acid, Venous: 0.7 mmol/L (ref 0.5–1.9)

## 2018-12-26 MED ORDER — SODIUM CHLORIDE 0.9% FLUSH: 10.0000 mL | INTRAVENOUS | Status: DC | PRN

## 2018-12-26 MED ORDER — GUAIFENESIN-DM 100-10 MG/5ML PO SYRP
5.0000 mL | ORAL_SOLUTION | ORAL | Status: DC | PRN
  Administered 2018-12-26 (×2): 5 mL via ORAL
  Filled 2018-12-26 (×2): qty 10

## 2018-12-26 NOTE — Progress Notes (Signed)
PROGRESS NOTE    [name] [name]  GNO:[id number] DOB: 10/16/1961 DOA: 12/25/2018 PCP: [name], PA-C     Brief Narrative:  [name] [name] is a 58 year old [place] male with past medical history significant for multiple myelomaonRevlimid,pulmonary hypertension, chronic hypoxic respiratory failure, chronic left-sided hydropneumothorax, bronchiectasis, allergic bronchopulmonary aspergillosis, heterozygous alpha 1antitrypsindeficiency, originally was treated for empyema with VATS decortication in 2016. Patient was recently admitted from 2/2 to 2/15 with PNA and bacteremia due to MSSA.  He was discharged on Ancef through PICC line to complete on 2/22, Revlimid has been put on hold.  He is also on a prednisone taper.  He presented to the emergency department with complaints of right flank pain, right lower quadrant abdominal pain.  In the emergency department, work-up revealed right-sided hydronephrosis.  Urology was consulted, thought to be secondary to some irritation from stenosis, that he could be stable for follow-up as an outpatient for outpatient ureteroscopy to look for mass or stenosis.  New events last 24 hours / Subjective: Patient evaluated with phone interpreter.  He states that he continues to have right-sided flank pain, improved but not resolved.  No nausea or vomiting and able to tolerate p.o., ate breakfast this morning without any issues. Has been urinating without issue   Assessment & Plan:   Principal Problem:   Hydronephrosis of right kidney Active Problems:   Pleural effusion, left   Chronic respiratory failure with hypoxia (HCC)   Multiple myeloma (HCC)   Alpha-1-antitrypsin deficiency (HCC)   Bacteremia due to methicillin susceptible Staphylococcus aureus (MSSA)   Right hydronephrosis Urology consulted in the emergency department, felt to be secondary to some irritation from stenosis.  Felt patient could follow-up as an outpatient for ureteroscopy to  look for mass or stenosis  AKI  Baseline Cr 0.5-0.6, presented with Cr 0.95 Due to obstructive process as above, monitor urine output   Previous history of MSSA bacteremia and right lower lobe pneumonia Continue IV Ancef through 2/22, continue prednisone taper  Chronic respiratory failure with hypoxemia Baseline use of O2 at nighttime  Multiple myeloma Revlimid on hold currently, continue acyclovir, prednisone taper as above   DVT prophylaxis: Lovenox Code Status: Full Family Communication: Family at bedside, attempted to reach family over the phone. Family friend was unavailable to provide interpretation today, niece did not speak English, Nephew did not pick up phone Disposition Plan: Pending improvement in pain, possible discharge 2/23   Consultants:   Urology over the phone   Procedures:   None  Antimicrobials:  Anti-infectives (From admission, onward)   Start     Dose/Rate Route Frequency Ordered Stop   12/26/18 1000  acyclovir (ZOVIRAX) tablet 400 mg     400 mg Oral Daily 12/25/18 2212     12/25/18 2230  ceFAZolin (ANCEF) IVPB 2g/100 mL premix     2 g 200 mL/hr over 30 Minutes Intravenous Every 8 hours 12/25/18 2216 12/27/18 0559   12/25/18 2215  ceFAZolin (ANCEF) IVPB  Status:  Discontinued    Note to Pharmacy:  Indication:  MSSA bacteremia Last Day of Therapy:  12/26/2018 Labs - Once weekly:  CBC/D and BMP,     2 g Intravenous Every 8 hours 12/25/18 2211 12/25/18 2214        Objective: Vitals:   12/26/18 0345 12/26/18 0528 12/26/18 0808 12/26/18 0900  BP:  126/72 116/75   Pulse:  74 73   Resp:  17 16   Temp:  98.5 F (36.9 C) 98.2 F (36.8 C)  TempSrc:   Oral   SpO2: 97% 99% 100% 100%  Weight:      Height:        Intake/Output Summary (Last 24 hours) at 12/26/2018 1124 Last data filed at 12/26/2018 0700 Gross per 24 hour  Intake 440 ml  Output -  Net 440 ml   Filed Weights   12/26/18 0000  Weight: 37.5 kg    Examination:  General exam:  Appears calm and comfortable  Respiratory system: Clear to auscultation. Respiratory effort normal. Cardiovascular system: S1 & S2 heard, RRR. No JVD, murmurs, rubs, gallops or clicks. No pedal edema. Gastrointestinal system: Abdomen is nondistended, soft and nontender. No organomegaly or masses felt. Normal bowel sounds heard. Central nervous system: Alert and oriented. No focal neurological deficits. Extremities: Symmetric 5 x 5 power. Skin: No rashes, lesions or ulcers Psychiatry: Judgement and insight appear normal. Mood & affect appropriate.   Data Reviewed: I have personally reviewed following labs and imaging studies  CBC: Recent Labs  Lab 12/25/18 1816  WBC 9.6  NEUTROABS 8.8*  HGB 8.5*  HCT 27.2*  MCV 80.0  PLT 834   Basic Metabolic Panel: Recent Labs  Lab 12/25/18 1816  NA 135  K 4.0  CL 90*  CO2 38*  GLUCOSE 142*  BUN 21*  CREATININE 0.95  CALCIUM 7.9*   GFR: Estimated Creatinine Clearance: 46.1 mL/min (by C-G formula based on SCr of 0.95 mg/dL). Liver Function Tests: Recent Labs  Lab 12/25/18 1816  AST 19  ALT 6  ALKPHOS 60  BILITOT 0.4  PROT 6.1*  ALBUMIN 2.4*   Recent Labs  Lab 12/25/18 1816  LIPASE 33   No results for input(s): AMMONIA in the last 168 hours. Coagulation Profile: No results for input(s): INR, PROTIME in the last 168 hours. Cardiac Enzymes: No results for input(s): CKTOTAL, CKMB, CKMBINDEX, TROPONINI in the last 168 hours. BNP (last 3 results) No results for input(s): PROBNP in the last 8760 hours. HbA1C: No results for input(s): HGBA1C in the last 72 hours. CBG: No results for input(s): GLUCAP in the last 168 hours. Lipid Profile: No results for input(s): CHOL, HDL, LDLCALC, TRIG, CHOLHDL, LDLDIRECT in the last 72 hours. Thyroid Function Tests: No results for input(s): TSH, T4TOTAL, FREET4, T3FREE, THYROIDAB in the last 72 hours. Anemia Panel: No results for input(s): VITAMINB12, FOLATE, FERRITIN, TIBC, IRON,  RETICCTPCT in the last 72 hours. Sepsis Labs: Recent Labs  Lab 12/25/18 1816 12/26/18 0615  LATICACIDVEN 1.2 0.7    No results found for this or any previous visit (from the past 240 hour(s)).     Radiology Studies: Dg Chest 2 View  Result Date: 12/25/2018 CLINICAL DATA:  Recent pneumonia. Patient was treated for empyema with VATS decortication in 2016. EXAM: CHEST - 2 VIEW COMPARISON:  12/13/2018 CXR and 12/06/2018 chest CT FINDINGS: Stable top-normal heart size. Aortic atherosclerosis at the arch without aneurysm. Central vascular congestion and interstitial edema is redemonstrated. Minimal chronic blunting the right lateral costophrenic angle either from pleural thickening or trace pleural fluid. Chronic pleural plaque is noted along the periphery of the right base. Dense consolidation at the left lung base suspicious for atelectasis or pneumonia. Redemonstration of chronic extrapleural cavity containing mostly air and small amount of fluid. Right-sided PICC line tip terminates at the cavoatrial junction. IMPRESSION: 1. Borderline cardiomegaly with central vascular congestion and interstitial edema. 2. Dense consolidation at the left lung base, suspicious for atelectasis and/or pneumonia. Redemonstration of chronic extrapulmonary, mostly air containing cavity with small  amount of residual fluid and/or debris along the dependent aspect. Electronically Signed   By: Ashley Royalty M.D.   On: 12/25/2018 19:40   Ct Abdomen Pelvis W Contrast  Result Date: 12/25/2018 CLINICAL DATA:  Right flank and lower quadrant pain. EXAM: CT ABDOMEN AND PELVIS WITH CONTRAST TECHNIQUE: Multidetector CT imaging of the abdomen and pelvis was performed using the standard protocol following bolus administration of intravenous contrast. CONTRAST:  4m ISOVUE-300 IOPAMIDOL (ISOVUE-300) INJECTION 61% COMPARISON:  CT chest dated December 06, 2018. CT abdomen pelvis dated January 23, 2018. FINDINGS: Lower chest: Decreased  consolidation in the right lower lobe with interval cavitation. Chronic loculated, complex pleural fluid collection at the left lung base with thick pleural enhancement. New component of air within the collection anteriorly when compared to prior study. Hepatobiliary: Unchanged liver hemangiomas. No new focal liver lesion. Gallbladder is unremarkable. No biliary dilatation. Pancreas: Unremarkable. No pancreatic ductal dilatation or surrounding inflammatory changes. Spleen: Normal in size. Unchanged peripheral calcifications likely due to old trauma. Adrenals/Urinary Tract: The adrenal glands are unremarkable. New patchy hypoenhancement of the right kidney, best appreciated on the delayed images. Delayed excretion of contrast from the right kidney. Mild right hydronephrosis with focal ill-defined enhancement in the proximal right ureter. Unchanged bilateral renal cysts. The bladder is unremarkable. Stomach/Bowel: Stomach is within normal limits. Appendix appears normal. No evidence of bowel wall thickening, distention, or inflammatory changes. Vascular/Lymphatic: Aortic atherosclerosis. No enlarged abdominal or pelvic lymph nodes. Reproductive: Prostate is unremarkable. Other: Mild mesenteric edema and small ascites. No pneumoperitoneum. Musculoskeletal: Stable lytic lesion involving the L4 vertebral body. No new osseous lesions. IMPRESSION: 1. Findings consistent with right-sided pyelonephritis. Mild right hydronephrosis with focal ill-defined enhancement of the proximal right ureter that could reflect ascending urinary tract infection, although an underlying mass such as transitional cell carcinoma is not excluded. Correlation with cytology and ureteroscopy is recommended. 2. Resolving pneumonia in the right lower lobe with interval cavitation. 3. Chronic loculated, complex pleural fluid collection at the left lung base with increasing pleural gas. Electronically Signed   By: WTitus DubinM.D.   On: 12/25/2018  20:35      Scheduled Meds: . acyclovir  400 mg Oral Daily  . aspirin EC  81 mg Oral Daily  . enoxaparin (LOVENOX) injection  40 mg Subcutaneous Q24H  . fluticasone furoate-vilanterol  1 puff Inhalation Daily   And  . umeclidinium bromide  1 puff Inhalation Daily  . guaiFENesin  1,200 mg Oral BID  . [START ON 12/29/2018] predniSONE  10 mg Oral Q breakfast   Followed by  . [START ON 01/01/2019] predniSONE  5 mg Oral Q breakfast  . predniSONE  20 mg Oral Q breakfast   Continuous Infusions: .  ceFAZolin (ANCEF) IV 2 g (12/26/18 0645)     LOS: 0 days    Time spent: 40 minutes   JDessa Phi DO [facility] [email or web address] 12/26/2018, 11:24 AM

## 2018-12-27 DIAGNOSIS — N1330 Unspecified hydronephrosis: Secondary | ICD-10-CM | POA: Diagnosis not present

## 2018-12-27 LAB — BASIC METABOLIC PANEL
Anion gap: 6 (ref 5–15)
BUN: 15 mg/dL (ref 6–20)
CO2: 35 mmol/L — ABNORMAL HIGH (ref 22–32)
Calcium: 7 mg/dL — ABNORMAL LOW (ref 8.9–10.3)
Chloride: 92 mmol/L — ABNORMAL LOW (ref 98–111)
Creatinine, Ser: 1.08 mg/dL (ref 0.61–1.24)
GFR calc Af Amer: >60 mL/min (ref >60)
GFR calc non Af Amer: >60 mL/min (ref >60)
GLUCOSE: 98 mg/dL (ref 70–99)
Potassium: 3.5 mmol/L (ref 3.5–5.1)
Sodium: 133 mmol/L — ABNORMAL LOW (ref 135–145)

## 2018-12-27 LAB — URINE CULTURE: Culture: NO GROWTH

## 2018-12-27 MED ORDER — ENSURE ENLIVE PO LIQD
237.0000 mL | Freq: Two times a day (BID) | ORAL | Status: DC
  Administered 2018-12-27: 237 mL via ORAL

## 2018-12-27 MED ORDER — OXYCODONE HCL 5 MG PO TABS: 5.0000 mg | ORAL_TABLET | Freq: Four times a day (QID) | ORAL | 0 refills | Status: AC | PRN

## 2018-12-27 NOTE — Progress Notes (Addendum)
Patient has discharged to home w on 12/27/2018. Discharge instruction including medication and appointment was given to patient. Patient has no question at this time. PICC line will be removed as MD order before going home.

## 2018-12-27 NOTE — Discharge Summary (Signed)
Physician Discharge Summary  [name] [name] PJK:[id number] DOB: 1960-11-08 DOA: 12/25/2018  PCP: [name], PA-C  Admit date: 12/25/2018 Discharge date: 12/27/2018  Admitted From: Home Disposition:  Home  Recommendations for Outpatient Follow-up:  1. Follow up with PCP  2. Follow up with Urology as soon as able 3. Please obtain BMP in 1 week   Discharge Condition: Stable CODE STATUS: Full  Diet recommendation: Regular   Brief/Interim Summary: [name] [name] is a 58 year old [place] male with past medical history significant for multiple myelomaonRevlimid,pulmonary hypertension, chronic hypoxic respiratory failure, chronic left-sided hydropneumothorax, bronchiectasis, allergic bronchopulmonary aspergillosis, heterozygous alpha 1antitrypsindeficiency, originally was treated for empyema with VATS decortication in 2016. Patient was recently admitted from 2/2 to 2/15 with PNA and bacteremia due to MSSA. He was discharged on Ancef through PICC line to complete on 2/22, Revlimid has been put on hold. He is also on a prednisone taper.  He presented to the emergency department with complaints of right flank pain, right lower quadrant abdominal pain.  In the emergency department, work-up revealed right-sided hydronephrosis.  Urology was consulted, thought to be secondary to some irritation from stenosis, that he could be stable for follow-up as an outpatient for outpatient ureteroscopy to look for mass or stenosis.  Patient was observed in the hospital.  His pain of the right flank improved, well controlled on pain medication, was able to tolerate p.o. without any issues of vomiting.  He is discharged home in stable condition, instructed to follow-up with outpatient urology for ureteroscopy to look for mass or stenosis.  He will also need follow-up BMP to repeat check acute kidney injury.  On day of discharge, he remained stable without acute complaints, urinating adequately. On day of  discharge, patient's niece provided translation over the phone; all questions answered.   Discharge Diagnoses:  Principal Problem:   Hydronephrosis of right kidney Active Problems:   Pleural effusion, left   Chronic respiratory failure with hypoxia (HCC)   Multiple myeloma (HCC)   Alpha-1-antitrypsin deficiency (HCC)   Bacteremia due to methicillin susceptible Staphylococcus aureus (MSSA)   Right hydronephrosis Urology consulted in the emergency department, felt to be secondary to some irritation from stenosis.  Felt patient could follow-up as an outpatient for ureteroscopy to look for mass or stenosis   AKI  Baseline Cr 0.5-0.6 Due to obstructive process as above, monitor urine output  Repeat labs as an outpatient  Previous history of MSSA bacteremia and right lower lobe pneumonia Completed IV Ancef through 2/22, continue prednisone taper.  PICC line removed prior to discharge  Chronic respiratory failure with hypoxemia Baseline use of O2 at nighttime  Multiple myeloma Revlimid on hold currently, continue acyclovir, prednisone taper as above   Discharge Instructions  Discharge Instructions    Call MD for:   Complete by:  As directed    Decreased urine output   Call MD for:  difficulty breathing, headache or visual disturbances   Complete by:  As directed    Call MD for:  extreme fatigue   Complete by:  As directed    Call MD for:  persistant dizziness or light-headedness   Complete by:  As directed    Call MD for:  persistant nausea and vomiting   Complete by:  As directed    Call MD for:  severe uncontrolled pain   Complete by:  As directed    Call MD for:  temperature >100.4   Complete by:  As directed    Diet - low  sodium heart healthy   Complete by:  As directed    Discharge instructions   Complete by:  As directed    You were cared for by a hospitalist during your hospital stay. If you have any questions about your discharge medications or the care you  received while you were in the hospital after you are discharged, you can call the unit and ask to speak with the hospitalist on call if the hospitalist that took care of you is not available. Once you are discharged, your primary care physician will handle any further medical issues. Please note that NO REFILLS for any discharge medications will be authorized once you are discharged, as it is imperative that you return to your primary care physician (or establish a relationship with a primary care physician if you do not have one) for your aftercare needs so that they can reassess your need for medications and monitor your lab values.   Increase activity slowly   Complete by:  As directed      Allergies as of 12/27/2018   No Known Allergies     Medication List    STOP taking these medications   ceFAZolin  IVPB Commonly known as:  ANCEF     TAKE these medications   acyclovir 400 MG tablet Commonly known as:  ZOVIRAX Take 1 tablet (400 mg total) by mouth daily.   aspirin EC 81 MG tablet Take 1 tablet (81 mg total) by mouth daily.   dexamethasone 4 MG tablet Commonly known as:  DECADRON Take 5 tablets (20 mg total) by mouth once a week.   docusate sodium 50 MG capsule Commonly known as:  COLACE Take 1 capsule (50 mg total) by mouth daily.   Fluticasone-Umeclidin-Vilant 100-62.5-25 MCG/INH Aepb Commonly known as:  TRELEGY ELLIPTA Inhale 1 puff into the lungs daily.   guaiFENesin 600 MG 12 hr tablet Commonly known as:  MUCINEX Take 2 tablets (1,200 mg total) by mouth 2 (two) times daily.   oxyCODONE 5 MG immediate release tablet Commonly known as:  ROXICODONE Take 1-2 tablets (5-10 mg total) by mouth every 6 (six) hours as needed for up to 5 days for severe pain or breakthrough pain.   OXYGEN 3L BEDTIME ONLY   predniSONE 10 MG tablet Commonly known as:  DELTASONE Take 1 tablet (10 mg total) by mouth daily. Taper dose: '60mg'$  po daily x 3 days, then '40mg'$  po daily x 3 days,  then '20mg'$  po daily x 3 days, then '10mg'$  po daily x 3 days, then '5mg'$  po daily x 2 days, then stop and resume decadron as previously prescribed   PROVENTIL HFA 108 (90 Base) MCG/ACT inhaler Generic drug:  albuterol INHALE 2 PUFFS EVERY 6 HOURS AS NEEDED FOR WHEEZING OR SHORTNESS OF BREATH.      Follow-up Information    [name], PA-C Follow up.   Specialty:  Physician Assistant Contact information: [place] [address] [phone]        [facility]. Schedule an appointment as soon as possible for a visit.   Why:  Call 12/28/2018 to make follow up appointment with urologist. Dennis Bast will need follow up for outpatient procedure ureteroscopy to evaluate mild right hydronephrosis  Contact information: [place] [phone]         No Known Allergies  Consultations:  Urology, over the phone   Procedures/Studies: Dg Chest 2 View  Result Date: 12/25/2018 CLINICAL DATA:  Recent pneumonia. Patient was treated for empyema with VATS decortication in 2016. EXAM: CHEST - 2 VIEW COMPARISON:  12/13/2018 CXR and 12/06/2018 chest CT FINDINGS: Stable top-normal heart size. Aortic atherosclerosis at the arch without aneurysm. Central vascular congestion and interstitial edema is redemonstrated. Minimal chronic blunting the right lateral costophrenic angle either from pleural thickening or trace pleural fluid. Chronic pleural plaque is noted along the periphery of the right base. Dense consolidation at the left lung base suspicious for atelectasis or pneumonia. Redemonstration of chronic extrapleural cavity containing mostly air and small amount of fluid. Right-sided PICC line tip terminates at the cavoatrial junction. IMPRESSION: 1. Borderline cardiomegaly with central vascular congestion and interstitial edema. 2. Dense consolidation at the left lung base, suspicious for atelectasis and/or pneumonia.  Redemonstration of chronic extrapulmonary, mostly air containing cavity with small amount of residual fluid and/or debris along the dependent aspect. Electronically Signed   By: Ashley Royalty M.D.   On: 12/25/2018 19:40    Ct Abdomen Pelvis W Contrast  Result Date: 12/25/2018 CLINICAL DATA:  Right flank and lower quadrant pain. EXAM: CT ABDOMEN AND PELVIS WITH CONTRAST TECHNIQUE: Multidetector CT imaging of the abdomen and pelvis was performed using the standard protocol following bolus administration of intravenous contrast. CONTRAST:  35m ISOVUE-300 IOPAMIDOL (ISOVUE-300) INJECTION 61% COMPARISON:  CT chest dated December 06, 2018. CT abdomen pelvis dated January 23, 2018. FINDINGS: Lower chest: Decreased consolidation in the right lower lobe with interval cavitation. Chronic loculated, complex pleural fluid collection at the left lung base with thick pleural enhancement. New component of air within the collection anteriorly when compared to prior study. Hepatobiliary: Unchanged liver hemangiomas. No new focal liver lesion. Gallbladder is unremarkable. No biliary dilatation. Pancreas: Unremarkable. No pancreatic ductal dilatation or surrounding inflammatory changes. Spleen: Normal in size. Unchanged peripheral calcifications likely due to old trauma. Adrenals/Urinary Tract: The adrenal glands are unremarkable. New patchy hypoenhancement of the right kidney, best appreciated on the delayed images. Delayed excretion of contrast from the right kidney. Mild right hydronephrosis with focal ill-defined enhancement in the proximal right ureter. Unchanged bilateral renal cysts. The bladder is unremarkable. Stomach/Bowel: Stomach is within normal limits. Appendix appears normal. No evidence of bowel wall thickening, distention, or inflammatory changes. Vascular/Lymphatic: Aortic atherosclerosis. No enlarged abdominal or pelvic lymph nodes. Reproductive: Prostate is unremarkable. Other: Mild mesenteric edema and small  ascites. No pneumoperitoneum. Musculoskeletal: Stable lytic lesion involving the L4 vertebral body. No new osseous lesions. IMPRESSION: 1. Findings consistent with right-sided pyelonephritis. Mild right hydronephrosis with focal ill-defined enhancement of the proximal right ureter that could reflect ascending urinary tract infection, although an underlying mass such as transitional cell carcinoma is not excluded. Correlation with cytology and ureteroscopy is recommended. 2. Resolving pneumonia in the right lower lobe with interval cavitation. 3. Chronic loculated, complex pleural fluid collection at the left lung base with increasing pleural gas. Electronically Signed   By: WTitus DubinM.D.   On: 12/25/2018 20:35    Discharge Exam: Vitals:   12/27/18 0841 12/27/18 0844  BP:    Pulse:    Resp:    Temp:    SpO2: 98% 98%    General: Pt is alert, awake, not in acute distress Cardiovascular: RRR, S1/S2 +, no rubs, no gallops Respiratory: CTA bilaterally, no wheezing, no rhonchi Abdominal: Soft, NT, ND, bowel sounds + Extremities: no edema, no cyanosis    The results of significant diagnostics from this hospitalization (including imaging, microbiology, ancillary and laboratory) are listed below for reference.  Microbiology: Recent Results (from the past 240 hour(s))  Blood culture (routine x 2)     Status: None (Preliminary result)   Collection Time: 12/25/18  6:15 PM  Result Value Ref Range Status   Specimen Description BLOOD PICC  Final   Special Requests   Final    BOTTLES DRAWN AEROBIC AND ANAEROBIC Blood Culture adequate volume Performed at [facility] [address], [place], [place] [address]    Culture NO GROWTH 2 DAYS  Final   Report Status PENDING  Incomplete  Blood culture (routine x 2)     Status: None (Preliminary result)   Collection Time: 12/25/18  6:34 PM  Result Value Ref Range Status   Specimen Description BLOOD LEFT FOREARM  Final    Special Requests   Final    BOTTLES DRAWN AEROBIC AND ANAEROBIC Blood Culture adequate volume Performed at [facility] [address], [place], [place] [address]    Culture NO GROWTH 2 DAYS  Final   Report Status PENDING  Incomplete  Urine culture     Status: None   Collection Time: 12/25/18  7:31 PM  Result Value Ref Range Status   Specimen Description   Final    URINE, CLEAN CATCH Performed at [facility], [place] [address] [place], [place] [address]    Special Requests   Final    NONE Performed at [facility], [place] [address] [place], [place] [address]    Culture   Final    NO GROWTH Performed at [facility], [place] [address], [place],  [address]    Report Status 12/27/2018 FINAL  Final     Labs: BNP (last 3 results) Recent Labs    01/26/18 1445 01/26/18 1900 12/09/18 0446  BNP 465.9* 456.2* 650.3*   Basic Metabolic Panel: Recent Labs  Lab 12/25/18 1816 12/27/18 0424  NA 135 133*  K 4.0 3.5  CL 90* 92*  CO2 38* 35*  GLUCOSE 142* 98  BUN 21* 15  CREATININE 0.95 1.08  CALCIUM 7.9* 7.0*   Liver Function Tests: Recent Labs  Lab 12/25/18 1816  AST 19  ALT 6  ALKPHOS 60  BILITOT 0.4  PROT 6.1*  ALBUMIN 2.4*   Recent Labs  Lab 12/25/18 1816  LIPASE 33   No results for input(s): AMMONIA in the last 168 hours. CBC: Recent Labs  Lab 12/25/18 1816  WBC 9.6  NEUTROABS 8.8*  HGB 8.5*  HCT 27.2*  MCV 80.0  PLT 268   Cardiac Enzymes: No results for input(s): CKTOTAL, CKMB, CKMBINDEX, TROPONINI in the last 168 hours. BNP: Invalid input(s): POCBNP CBG: No results for input(s): GLUCAP in the last 168 hours. D-Dimer No results for input(s): DDIMER in the last 72 hours. Hgb A1c No results for input(s): HGBA1C in the last 72 hours. Lipid Profile No results for input(s): CHOL, HDL, LDLCALC, TRIG, CHOLHDL, LDLDIRECT in the last 72 hours. Thyroid function studies No results for  input(s): TSH, T4TOTAL, T3FREE, THYROIDAB in the last 72 hours.  Invalid input(s): FREET3 Anemia work up No results for input(s): VITAMINB12, FOLATE, FERRITIN, TIBC, IRON, RETICCTPCT in the last 72 hours. Urinalysis    Component Value Date/Time   COLORURINE YELLOW 12/25/2018 1931   APPEARANCEUR CLOUDY (A) 12/25/2018 1931   LABSPEC 1.012 12/25/2018 1931   PHURINE 8.0 12/25/2018 1931   GLUCOSEU 50 (A) 12/25/2018 1931   HGBUR MODERATE (A) 12/25/2018 [place] NEGATIVE 12/25/2018 1931  KETONESUR NEGATIVE 12/25/2018 1931   PROTEINUR NEGATIVE 12/25/2018 1931   UROBILINOGEN 0.2 11/11/2012 0145   NITRITE NEGATIVE 12/25/2018 1931   LEUKOCYTESUR NEGATIVE 12/25/2018 1931   Sepsis Labs Invalid input(s): PROCALCITONIN,  WBC,  LACTICIDVEN Microbiology Recent Results (from the past 240 hour(s))  Blood culture (routine x 2)     Status: None (Preliminary result)   Collection Time: 12/25/18  6:15 PM  Result Value Ref Range Status   Specimen Description BLOOD PICC  Final   Special Requests   Final    BOTTLES DRAWN AEROBIC AND ANAEROBIC Blood Culture adequate volume Performed at [facility], [place] [address], [place], [place] [address]    Culture NO GROWTH 2 DAYS  Final   Report Status PENDING  Incomplete  Blood culture (routine x 2)     Status: None (Preliminary result)   Collection Time: 12/25/18  6:34 PM  Result Value Ref Range Status   Specimen Description BLOOD LEFT FOREARM  Final   Special Requests   Final    BOTTLES DRAWN AEROBIC AND ANAEROBIC Blood Culture adequate volume Performed at [facility] [address], [place], [place] [address]    Culture NO GROWTH 2 DAYS  Final   Report Status PENDING  Incomplete  Urine culture     Status: None   Collection Time: 12/25/18  7:31 PM  Result Value Ref Range Status   Specimen Description   Final    URINE, CLEAN CATCH Performed at [facility], [place] [address],  [place], [place] [address]    Special Requests   Final    NONE Performed at [facility], [place] [address] [place], [place] [address]    Culture   Final    NO GROWTH Performed at [facility], [place] [address] [place], [place] [address]    Report Status 12/27/2018 FINAL  Final     Patient was seen and examined on the day of discharge and was found to be in stable condition. Time coordinating discharge: 35 minutes including assessment and coordination of care, as well as examination of the patient.   SIGNED:  Dessa Phi, DO [facility] [email or web address] 12/27/2018, 10:50 AM

## 2018-12-30 ENCOUNTER — Ambulatory Visit: Payer: Self-pay

## 2018-12-30 ENCOUNTER — Other Ambulatory Visit: Payer: Self-pay

## 2018-12-30 LAB — CULTURE, BLOOD (ROUTINE X 2)
Culture: NO GROWTH
Culture: NO GROWTH
Special Requests: ADEQUATE
Special Requests: ADEQUATE

## 2018-12-30 MED FILL — SULFAMETHOXAZOLE-TMP DS TAB: 800-160 | 14 days supply | Qty: 28 | Fill #0

## 2019-01-05 ENCOUNTER — Other Ambulatory Visit: Payer: Self-pay

## 2019-01-05 ENCOUNTER — Inpatient Hospital Stay (HOSPITAL_COMMUNITY): Payer: Medicare Other

## 2019-01-05 ENCOUNTER — Emergency Department (HOSPITAL_COMMUNITY): Payer: Medicare Other

## 2019-01-05 ENCOUNTER — Inpatient Hospital Stay (HOSPITAL_COMMUNITY)
Admission: EM | Admit: 2019-01-05 | Discharge: 2019-02-03 | DRG: 853 | Disposition: E | Payer: Medicare Other | Attending: Pulmonary Disease | Admitting: Pulmonary Disease

## 2019-01-05 ENCOUNTER — Encounter (HOSPITAL_COMMUNITY): Payer: Self-pay

## 2019-01-05 DIAGNOSIS — R6521 Severe sepsis with septic shock: Secondary | ICD-10-CM | POA: Diagnosis present

## 2019-01-05 DIAGNOSIS — N23 Unspecified renal colic: Secondary | ICD-10-CM | POA: Diagnosis present

## 2019-01-05 DIAGNOSIS — R0603 Acute respiratory distress: Secondary | ICD-10-CM

## 2019-01-05 DIAGNOSIS — J9621 Acute and chronic respiratory failure with hypoxia: Secondary | ICD-10-CM | POA: Diagnosis present

## 2019-01-05 DIAGNOSIS — J188 Other pneumonia, unspecified organism: Secondary | ICD-10-CM | POA: Diagnosis present

## 2019-01-05 DIAGNOSIS — J869 Pyothorax without fistula: Secondary | ICD-10-CM | POA: Diagnosis present

## 2019-01-05 DIAGNOSIS — A419 Sepsis, unspecified organism: Secondary | ICD-10-CM | POA: Diagnosis present

## 2019-01-05 DIAGNOSIS — I5032 Chronic diastolic (congestive) heart failure: Secondary | ICD-10-CM | POA: Diagnosis present

## 2019-01-05 DIAGNOSIS — Z681 Body mass index (BMI) 19 or less, adult: Secondary | ICD-10-CM | POA: Diagnosis not present

## 2019-01-05 DIAGNOSIS — Z7952 Long term (current) use of systemic steroids: Secondary | ICD-10-CM

## 2019-01-05 DIAGNOSIS — J440 Chronic obstructive pulmonary disease with acute lower respiratory infection: Secondary | ICD-10-CM | POA: Diagnosis present

## 2019-01-05 DIAGNOSIS — N179 Acute kidney failure, unspecified: Secondary | ICD-10-CM | POA: Diagnosis present

## 2019-01-05 DIAGNOSIS — J449 Chronic obstructive pulmonary disease, unspecified: Secondary | ICD-10-CM | POA: Diagnosis present

## 2019-01-05 DIAGNOSIS — Z87442 Personal history of urinary calculi: Secondary | ICD-10-CM

## 2019-01-05 DIAGNOSIS — I110 Hypertensive heart disease with heart failure: Secondary | ICD-10-CM | POA: Diagnosis present

## 2019-01-05 DIAGNOSIS — C9000 Multiple myeloma not having achieved remission: Secondary | ICD-10-CM | POA: Diagnosis present

## 2019-01-05 DIAGNOSIS — Z8249 Family history of ischemic heart disease and other diseases of the circulatory system: Secondary | ICD-10-CM

## 2019-01-05 DIAGNOSIS — B9561 Methicillin susceptible Staphylococcus aureus infection as the cause of diseases classified elsewhere: Secondary | ICD-10-CM | POA: Diagnosis present

## 2019-01-05 DIAGNOSIS — E872 Acidosis: Secondary | ICD-10-CM | POA: Diagnosis present

## 2019-01-05 DIAGNOSIS — N12 Tubulo-interstitial nephritis, not specified as acute or chronic: Secondary | ICD-10-CM

## 2019-01-05 DIAGNOSIS — Z79899 Other long term (current) drug therapy: Secondary | ICD-10-CM

## 2019-01-05 DIAGNOSIS — R69 Illness, unspecified: Secondary | ICD-10-CM

## 2019-01-05 DIAGNOSIS — I471 Supraventricular tachycardia: Secondary | ICD-10-CM | POA: Diagnosis not present

## 2019-01-05 DIAGNOSIS — E871 Hypo-osmolality and hyponatremia: Secondary | ICD-10-CM | POA: Diagnosis present

## 2019-01-05 DIAGNOSIS — Z452 Encounter for adjustment and management of vascular access device: Secondary | ICD-10-CM

## 2019-01-05 DIAGNOSIS — D62 Acute posthemorrhagic anemia: Secondary | ICD-10-CM | POA: Diagnosis present

## 2019-01-05 DIAGNOSIS — L8990 Pressure ulcer of unspecified site, unspecified stage: Secondary | ICD-10-CM | POA: Diagnosis present

## 2019-01-05 DIAGNOSIS — N136 Pyonephrosis: Secondary | ICD-10-CM | POA: Diagnosis present

## 2019-01-05 DIAGNOSIS — A4101 Sepsis due to Methicillin susceptible Staphylococcus aureus: Secondary | ICD-10-CM | POA: Diagnosis present

## 2019-01-05 DIAGNOSIS — E875 Hyperkalemia: Secondary | ICD-10-CM | POA: Diagnosis not present

## 2019-01-05 DIAGNOSIS — J948 Other specified pleural conditions: Secondary | ICD-10-CM | POA: Diagnosis present

## 2019-01-05 DIAGNOSIS — Z978 Presence of other specified devices: Secondary | ICD-10-CM

## 2019-01-05 DIAGNOSIS — I519 Heart disease, unspecified: Secondary | ICD-10-CM | POA: Diagnosis not present

## 2019-01-05 DIAGNOSIS — E8801 Alpha-1-antitrypsin deficiency: Secondary | ICD-10-CM | POA: Diagnosis present

## 2019-01-05 DIAGNOSIS — J479 Bronchiectasis, uncomplicated: Secondary | ICD-10-CM

## 2019-01-05 DIAGNOSIS — K567 Ileus, unspecified: Secondary | ICD-10-CM | POA: Diagnosis present

## 2019-01-05 DIAGNOSIS — J9601 Acute respiratory failure with hypoxia: Secondary | ICD-10-CM

## 2019-01-05 DIAGNOSIS — I469 Cardiac arrest, cause unspecified: Secondary | ICD-10-CM | POA: Diagnosis not present

## 2019-01-05 DIAGNOSIS — N2889 Other specified disorders of kidney and ureter: Secondary | ICD-10-CM | POA: Diagnosis not present

## 2019-01-05 DIAGNOSIS — E43 Unspecified severe protein-calorie malnutrition: Secondary | ICD-10-CM | POA: Diagnosis present

## 2019-01-05 DIAGNOSIS — N10 Acute tubulo-interstitial nephritis: Secondary | ICD-10-CM | POA: Diagnosis not present

## 2019-01-05 DIAGNOSIS — R310 Gross hematuria: Secondary | ICD-10-CM | POA: Diagnosis present

## 2019-01-05 DIAGNOSIS — R0602 Shortness of breath: Secondary | ICD-10-CM | POA: Diagnosis present

## 2019-01-05 DIAGNOSIS — Z9981 Dependence on supplemental oxygen: Secondary | ICD-10-CM

## 2019-01-05 DIAGNOSIS — I272 Pulmonary hypertension, unspecified: Secondary | ICD-10-CM | POA: Diagnosis present

## 2019-01-05 DIAGNOSIS — R7881 Bacteremia: Secondary | ICD-10-CM | POA: Diagnosis not present

## 2019-01-05 DIAGNOSIS — I5189 Other ill-defined heart diseases: Secondary | ICD-10-CM | POA: Diagnosis present

## 2019-01-05 LAB — PROTIME-INR
INR: 1.1 (ref 0.8–1.2)
Prothrombin Time: 14.1 seconds (ref 11.4–15.2)

## 2019-01-05 LAB — CBC WITH DIFFERENTIAL/PLATELET
Abs Immature Granulocytes: 0.1 10*3/uL — ABNORMAL HIGH (ref 0.00–0.07)
Basophils Absolute: 0 10*3/uL (ref 0.0–0.1)
Basophils Relative: 0 %
Eosinophils Absolute: 0.1 10*3/uL (ref 0.0–0.5)
Eosinophils Relative: 1 %
HEMATOCRIT: 29.9 % — AB (ref 39.0–52.0)
Hemoglobin: 9.5 g/dL — ABNORMAL LOW (ref 13.0–17.0)
IMMATURE GRANULOCYTES: 1 %
LYMPHS ABS: 0.8 10*3/uL (ref 0.7–4.0)
Lymphocytes Relative: 5 %
MCH: 24 pg — ABNORMAL LOW (ref 26.0–34.0)
MCHC: 31.8 g/dL (ref 30.0–36.0)
MCV: 75.5 fL — ABNORMAL LOW (ref 80.0–100.0)
Monocytes Absolute: 1 10*3/uL (ref 0.1–1.0)
Monocytes Relative: 6 %
Neutro Abs: 14.2 10*3/uL — ABNORMAL HIGH (ref 1.7–7.7)
Neutrophils Relative %: 87 %
Platelets: 540 10*3/uL — ABNORMAL HIGH (ref 150–400)
RBC: 3.96 MIL/uL — ABNORMAL LOW (ref 4.22–5.81)
RDW: 16.8 % — ABNORMAL HIGH (ref 11.5–15.5)
WBC: 16.2 10*3/uL — ABNORMAL HIGH (ref 4.0–10.5)
nRBC: 0 % (ref 0.0–0.2)

## 2019-01-05 LAB — COMPREHENSIVE METABOLIC PANEL
ALT: 10 U/L (ref 0–44)
AST: 18 U/L (ref 15–41)
Albumin: 2 g/dL — ABNORMAL LOW (ref 3.5–5.0)
Alkaline Phosphatase: 81 U/L (ref 38–126)
Anion gap: 4 — ABNORMAL LOW (ref 5–15)
BILIRUBIN TOTAL: 0.7 mg/dL (ref 0.3–1.2)
BUN: 28 mg/dL — ABNORMAL HIGH (ref 6–20)
CO2: 37 mmol/L — ABNORMAL HIGH (ref 22–32)
CREATININE: 1.56 mg/dL — AB (ref 0.61–1.24)
Calcium: 8.2 mg/dL — ABNORMAL LOW (ref 8.9–10.3)
Chloride: 90 mmol/L — ABNORMAL LOW (ref 98–111)
GFR calc Af Amer: 57 mL/min — ABNORMAL LOW (ref >60)
GFR calc non Af Amer: 49 mL/min — ABNORMAL LOW (ref >60)
Glucose, Bld: 70 mg/dL (ref 70–99)
Potassium: 5.2 mmol/L — ABNORMAL HIGH (ref 3.5–5.1)
Sodium: 131 mmol/L — ABNORMAL LOW (ref 135–145)
TOTAL PROTEIN: 6.4 g/dL — AB (ref 6.5–8.1)

## 2019-01-05 LAB — URINALYSIS, ROUTINE W REFLEX MICROSCOPIC

## 2019-01-05 LAB — URINALYSIS, MICROSCOPIC (REFLEX)
Squamous Epithelial / HPF: NONE SEEN (ref 0–5)
WBC, UA: >50 WBC/hpf (ref 0–5)

## 2019-01-05 LAB — BRAIN NATRIURETIC PEPTIDE: B Natriuretic Peptide: 360.3 pg/mL — ABNORMAL HIGH (ref 0.0–100.0)

## 2019-01-05 LAB — MRSA PCR SCREENING: MRSA by PCR: NEGATIVE

## 2019-01-05 LAB — LIPASE, BLOOD: Lipase: 28 U/L (ref 11–51)

## 2019-01-05 LAB — LACTIC ACID, PLASMA
LACTIC ACID, VENOUS: 3.2 mmol/L — AB (ref 0.5–1.9)
Lactic Acid, Venous: 2.3 mmol/L (ref 0.5–1.9)
Lactic Acid, Venous: 3.1 mmol/L (ref 0.5–1.9)

## 2019-01-05 LAB — TROPONIN I: Troponin I: 0.07 ng/mL (ref <0.03)

## 2019-01-05 MED ORDER — ACETAMINOPHEN 325 MG PO TABS
650.0000 mg | ORAL_TABLET | Freq: Four times a day (QID) | ORAL | Status: DC | PRN
  Filled 2019-01-05: qty 2

## 2019-01-05 MED ORDER — VANCOMYCIN HCL IN DEXTROSE 1-5 GM/200ML-% IV SOLN
1000.0000 mg | Freq: Once | INTRAVENOUS | Status: AC
  Administered 2019-01-05: 1000 mg via INTRAVENOUS
  Filled 2019-01-05: qty 200

## 2019-01-05 MED ORDER — SODIUM CHLORIDE 0.9% FLUSH
3.0000 mL | Freq: Two times a day (BID) | INTRAVENOUS | Status: DC
  Administered 2019-01-06 – 2019-01-07 (×3): 3 mL via INTRAVENOUS

## 2019-01-05 MED ORDER — ACETAMINOPHEN 650 MG RE SUPP: 650.0000 mg | Freq: Four times a day (QID) | RECTAL | Status: DC | PRN

## 2019-01-05 MED ORDER — UMECLIDINIUM BROMIDE 62.5 MCG/INH IN AEPB
1.0000 | INHALATION_SPRAY | Freq: Every day | RESPIRATORY_TRACT | Status: DC
  Filled 2019-01-05: qty 7

## 2019-01-05 MED ORDER — ONDANSETRON HCL 4 MG/2ML IJ SOLN: 4.0000 mg | Freq: Four times a day (QID) | INTRAMUSCULAR | Status: DC | PRN

## 2019-01-05 MED ORDER — FLUTICASONE FUROATE-VILANTEROL 100-25 MCG/INH IN AEPB
1.0000 | INHALATION_SPRAY | Freq: Every day | RESPIRATORY_TRACT | Status: DC
  Filled 2019-01-05: qty 28

## 2019-01-05 MED ORDER — ONDANSETRON HCL 4 MG PO TABS: 4.0000 mg | ORAL_TABLET | Freq: Four times a day (QID) | ORAL | Status: DC | PRN

## 2019-01-05 MED ORDER — SODIUM CHLORIDE 0.9 % IV SOLN
1.0000 g | INTRAVENOUS | Status: DC
  Filled 2019-01-05: qty 1

## 2019-01-05 MED ORDER — POLYETHYLENE GLYCOL 3350 17 G PO PACK: 17.0000 g | PACK | Freq: Every day | ORAL | Status: DC | PRN

## 2019-01-05 MED ORDER — SODIUM CHLORIDE 0.9 % IV SOLN
INTRAVENOUS | Status: DC
  Administered 2019-01-05 – 2019-01-07 (×4): via INTRAVENOUS

## 2019-01-05 MED ORDER — SODIUM CHLORIDE 0.9 % IV BOLUS (SEPSIS)
1000.0000 mL | Freq: Once | INTRAVENOUS | Status: AC
  Administered 2019-01-05: 1000 mL via INTRAVENOUS

## 2019-01-05 MED ORDER — ALBUMIN HUMAN 5 % IV SOLN
12.5000 g | Freq: Once | INTRAVENOUS | Status: AC
  Administered 2019-01-06: 12.5 g via INTRAVENOUS
  Filled 2019-01-05: qty 250

## 2019-01-05 MED ORDER — SODIUM CHLORIDE 0.9 % IV BOLUS (SEPSIS)
250.0000 mL | Freq: Once | INTRAVENOUS | Status: AC
  Administered 2019-01-05: 250 mL via INTRAVENOUS

## 2019-01-05 MED ORDER — METRONIDAZOLE IN NACL 5-0.79 MG/ML-% IV SOLN
500.0000 mg | Freq: Three times a day (TID) | INTRAVENOUS | Status: DC
  Administered 2019-01-06 – 2019-01-07 (×5): 500 mg via INTRAVENOUS
  Filled 2019-01-05 (×6): qty 100

## 2019-01-05 MED ORDER — SODIUM CHLORIDE 0.9 % IV SOLN
2.0000 g | Freq: Once | INTRAVENOUS | Status: AC
  Administered 2019-01-05: 2 g via INTRAVENOUS
  Filled 2019-01-05: qty 2

## 2019-01-05 MED ORDER — SODIUM CHLORIDE 0.9 % IV SOLN: 250.0000 mL | INTRAVENOUS | Status: DC

## 2019-01-05 MED ORDER — NOREPINEPHRINE 4 MG/250ML-% IV SOLN
2.0000 ug/min | INTRAVENOUS | Status: DC
  Administered 2019-01-05: 5 ug/min via INTRAVENOUS
  Administered 2019-01-05: 2 ug/min via INTRAVENOUS
  Administered 2019-01-06 (×2): 10 ug/min via INTRAVENOUS
  Administered 2019-01-06: 5 ug/min via INTRAVENOUS
  Administered 2019-01-07: 7 ug/min via INTRAVENOUS
  Filled 2019-01-05 (×5): qty 250

## 2019-01-05 MED ORDER — HYDROCORTISONE NA SUCCINATE PF 100 MG IJ SOLR
50.0000 mg | Freq: Four times a day (QID) | INTRAMUSCULAR | Status: DC
  Administered 2019-01-05 – 2019-01-08 (×11): 50 mg via INTRAVENOUS
  Filled 2019-01-05 (×11): qty 2

## 2019-01-05 MED ORDER — FLUTICASONE-UMECLIDIN-VILANT 100-62.5-25 MCG/INH IN AEPB: 1.0000 | INHALATION_SPRAY | Freq: Every day | RESPIRATORY_TRACT | Status: DC

## 2019-01-05 MED ORDER — CHLORHEXIDINE GLUCONATE CLOTH 2 % EX PADS
6.0000 | MEDICATED_PAD | Freq: Every day | CUTANEOUS | Status: DC
  Administered 2019-01-06 – 2019-01-07 (×2): 6 via TOPICAL

## 2019-01-05 MED ORDER — VANCOMYCIN HCL 500 MG IV SOLR
500.0000 mg | INTRAVENOUS | Status: DC
  Administered 2019-01-07: 500 mg via INTRAVENOUS
  Filled 2019-01-05 (×2): qty 500

## 2019-01-05 NOTE — ED Notes (Signed)
Dr. Lorin Mercy hospitalist bedside.

## 2019-01-05 NOTE — Progress Notes (Signed)
Pharmacy Antibiotic Note  [name] [name] is a 58 y.o. male admitted on 01/07/2019 with UTI.  Pharmacy has been consulted for cefepime dosing. SCr 1.56 on admit.  Patient received cefepime 2g IV x 1 in the ER.  Plan: Continue cefepime 1g IV q24h Monitor clinical progress, c/s, renal function F/u de-escalation plan/LOT   Height: 5' (152.4 cm) Weight: 81 lb (36.7 kg) IBW/kg (Calculated) : 50  Temp (24hrs), Avg:97.8 F (36.6 C), Min:97.5 F (36.4 C), Max:98 F (36.7 C)  Recent Labs  Lab 01/11/2019 1124 01/13/2019 1314  WBC 16.2*  --   CREATININE 1.56*  --   LATICACIDVEN 2.3* 3.1*    Estimated Creatinine Clearance: 27.4 mL/min (A) (by C-G formula based on SCr of 1.56 mg/dL (H)).    No Known Allergies  Elicia Lamp, PharmD, BCPS Please check AMION for all [place] contact numbers Clinical Pharmacist 01/17/2019 3:15 PM

## 2019-01-05 NOTE — ED Notes (Signed)
Please call [place] (niece in law) with updates [phone]

## 2019-01-05 NOTE — Progress Notes (Signed)
Pharmacy Antibiotic Note  [name] [name] is a 58 y.o. male admitted on 01/06/2019 with UTI.  Pharmacy has been consulted for vancomycin dosing. WBC elevated at 16.2, SCr 1.56, lactic acid elevated at 3.1, HR 107, RR 26, patient currently afebrile.  Plan: Give vancomycin 1000 mg IV x 1 now, then 500 mg IV q36 hr (expected AUC 464, Cmax ss 30.3 Cmin ss 11.6 using Scr of 1.56) Obtain vancomycin peak and trough at steady state Monitor C&S, CBC, Scr  Height: 5' (152.4 cm) Weight: 81 lb (36.7 kg) IBW/kg (Calculated) : 50  Temp (24hrs), Avg:97.8 F (36.6 C), Min:97.5 F (36.4 C), Max:98 F (36.7 C)  Recent Labs  Lab 01/24/2019 1124 01/29/2019 1314  WBC 16.2*  --   CREATININE 1.56*  --   LATICACIDVEN 2.3* 3.1*    Estimated Creatinine Clearance: 27.4 mL/min (A) (by C-G formula based on SCr of 1.56 mg/dL (H)).    No Known Allergies  Thank you for allowing pharmacy to be a part of this patient's care.  Leron Croak, PharmD PGY1 Pharmacy Resident  Please check AMION for all [facility] phone numbers 01/24/2019 5:19 PM

## 2019-01-05 NOTE — ED Notes (Signed)
Informed Dr. Johnney Killian of pt's hematuria; UA with culture sent.

## 2019-01-05 NOTE — Consult Note (Signed)
NAMEAshaad [name], MRN:  [id number], DOB:  [birth year], LOS: 0 ADMISSION DATE:  01/24/2019, CONSULTATION DATE:  3/3 REFERRING MD:  Dr. Lorin Mercy [facility], CHIEF COMPLAINT:  Sepsis   Brief History   58 year old male admitted for severe sepsis secondary to pyelonephritis and possibly renal abscess/hemorrhage.   History of present illness   58 year old male with PMH as below, which is significant for A1A deficient emphysema, multiple myeloma on Revlimid, chronic respiratory failure on home O2, and chronic L hydropneumothorax s/p CATS decortication of empyema in 2016. He was admitted in early February for PNA and developed MSSA bacteremia. No vegetation seen on TTE. He was treated with 2 weeks of antibiotics. Then 2/21 he presented with R flank pain and on imaging was found to have R hydronephrosis and was discharged with plans for urology follow up. It would seem as though this had not yet occurred when on 3/3 early AM he had worsening flank pain and noted hematuria. 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 He then became hypotensive despite IVF resuscitation and PCCM was asked to see in consultation.   Past Medical History   has a past medical history of Acute on chronic respiratory failure ([place]) (02/07/2018), Hypertension, Kidney stones, Multiple myeloma ([place]), Obstructive lung disease (generalized) ([employer]), and Pulmonary hypertension ([place]).  Significant Hospital Events     Consults:  urology  Procedures:    Significant Diagnostic Tests:  CT abd pelvis 3/3 > Interval diffuse enlargement of the right kidney with interval areas of increased density in the right renal collecting system and right ureter, compatible with hemorrhage, causing mild to moderate right hydronephrosis and moderate right hydroureter. Interval blood in the dependent portion of the urinary bladder. Mild small  bowel ileus. Partial obstruction is less likely.  Micro Data:  Blood 3/3 > Urine 3/3 >  Antimicrobials:  Cefepime 3/3 > Vancomycin 3/3 > Flagyl 3/3 >  Interim history/subjective:    Objective   Blood pressure (!) 82/52, pulse (!) 125, temperature 98 F (36.7 C), temperature source Oral, resp. rate (!) 25, height 5' (1.524 m), weight 36.7 kg, SpO2 100 %.       No intake or output data in the 24 hours ending 01/28/2019 1741 Filed Weights   01/03/2019 0941  Weight: 36.7 kg    Examination: General: thin middle aged male  HENT: [place]/AT, PERRL, no VJD Lungs: coarse bases.  Cardiovascular: Tachy, regular, no MRG Abdomen: Soft, non-tender, non-distended Extremities: No acute deformity or ROM limitation Neuro: Alert, oriented, non-focal.   Resolved Hospital Problem list     Assessment & Plan:   Septic shock secondary to R pyelonephritis vs renal abscess: Hypotension refractory to 2L IVF and developing SOB and crackles.  - Admit to [facility] ICU due to bed and urology availability - No further IVF - Peripheral norepi to keep MAP > 46mHg - ABX as above - Urology evaluation pending. - Ensure lactic acid clearing.    R renal hemorrhage concerning for malignancy - Urology evaluation pending.  - trend CBC  Chronic hypoxemic respiratory failure secondary to emphysema (A1A deficient) - Continue supplemental O2 for SpO2 goal 90-95% - Continue home Trelegy  Chronic HFpEF - Take care with further IVF as there is current concern of volume overload  Anemia: hemoglobin currently above his recent baseline.  - Follow BMP  AKI: likely in the setting of shock - continued  resuscitation - Follow BMP  Multiple myeloma - per primary   Best practice:  Diet: NPO Pain/Anxiety/Delirium protocol (if indicated): n/a VAP protocol (if indicated): n/a DVT prophylaxis: SCDs GI prophylaxis: per primary Glucose control: n/a Mobility: BR Code Status: FULL Family Communication: Niece H-Neo Hornback  is contact person for him.  Disposition: ICu critically ill.   Labs   CBC: Recent Labs  Lab 01/17/2019 1124  WBC 16.2*  NEUTROABS 14.2*  HGB 9.5*  HCT 29.9*  MCV 75.5*  PLT 540*    Basic Metabolic Panel: Recent Labs  Lab 01/26/2019 1124  NA 131*  K 5.2*  CL 90*  CO2 37*  GLUCOSE 70  BUN 28*  CREATININE 1.56*  CALCIUM 8.2*   GFR: Estimated Creatinine Clearance: 27.4 mL/min (A) (by C-G formula based on SCr of 1.56 mg/dL (H)). Recent Labs  Lab 01/13/2019 1124 01/04/2019 1314  WBC 16.2*  --   LATICACIDVEN 2.3* 3.1*    Liver Function Tests: Recent Labs  Lab 01/15/2019 1124  AST 18  ALT 10  ALKPHOS 81  BILITOT 0.7  PROT 6.4*  ALBUMIN 2.0*   Recent Labs  Lab 01/31/2019 1124  LIPASE 28   No results for input(s): AMMONIA in the last 168 hours.  ABG    Component Value Date/Time   PHART 7.432 12/12/2018 0345   PCO2ART 44.1 12/12/2018 0345   PO2ART 88.7 12/12/2018 0345   HCO3 29.2 (H) 12/12/2018 0345   TCO2 32 06/24/2016 0902   ACIDBASEDEF 0.4 12/10/2018 0335   O2SAT 96.4 12/12/2018 0345     Coagulation Profile: Recent Labs  Lab 01/11/2019 1124  INR 1.1    Cardiac Enzymes: Recent Labs  Lab 01/09/2019 1124  TROPONINI 0.07*    HbA1C: No results found for: HGBA1C  CBG: No results for input(s): GLUCAP in the last 168 hours.  Review of Systems:   Bolds are positive  Constitutional: weight loss, gain, night sweats, Fevers, chills, fatigue .  HEENT: headaches, Sore throat, sneezing, nasal congestion, post nasal drip, Difficulty swallowing, Tooth/dental problems, visual complaints visual changes, ear ache CV:  chest pain, radiates:,Orthopnea, PND, swelling in lower extremitie, dizziness, palpitations, syncope.  GI  heartburn, indigestion, abdominal pain, nausea, vomiting, diarrhea, change in bowel habits, loss of appetite, bloody stools. Flank Pain Resp: cough, productive: white sputum, hemoptysis, dyspnea, chest pain, pleuritic.  Skin: rash or itching or  icterus GU: dysuria, change in color of urine, urgency or frequency. flank pain, hematuria  MS: joint pain or swelling. decreased range of motion  Psych: change in mood or affect. depression or anxiety.  Neuro: difficulty with speech, weakness, numbness, ataxia    Past Medical History  He,  has a past medical history of Acute on chronic respiratory failure ([place]) (02/07/2018), Hypertension, Kidney stones, Multiple myeloma ([place]), Obstructive lung disease (generalized) ([place]), and Pulmonary hypertension ([place]).   Surgical History    Past Surgical History:  Procedure Laterality Date  . CARDIAC CATHETERIZATION N/A 01/18/2016   Procedure: Right Heart Cath;  Surgeon: Jolaine Artist, MD;  Location: [place] [facility];  Service: Cardiovascular;  Laterality: N/A;  . CARDIAC CATHETERIZATION N/A 06/24/2016   Procedure: Right Heart Cath;  Surgeon: Jolaine Artist, MD;  Location: [place] [facility];  Service: Cardiovascular;  Laterality: N/A;  . IR RADIOLOGIST EVAL & MGMT  05/27/2018  . IR RADIOLOGIST EVAL & MGMT  09/01/2018  . KIDNEY SURGERY    . VIDEO ASSISTED THORACOSCOPY (VATS)/DECORTICATION Left 2003  . VIDEO ASSISTED THORACOSCOPY (VATS)/DECORTICATION Left 10/02/2015  Procedure: VIDEO ASSISTED THORACOSCOPY (VATS)/DECORTICATION and drainage of chronic empyena;  Surgeon: [name], MD;  Location: [place];  Service: Thoracic;  Laterality: Left;  Marland Kitchen VIDEO BRONCHOSCOPY N/A 10/02/2015   Procedure: VIDEO BRONCHOSCOPY;  Surgeon: [name], MD;  Location: [facility];  Service: Thoracic;  Laterality: N/A;     Social History   reports that he has never smoked. He has never used smokeless tobacco. He reports previous alcohol use. He reports that he does not use drugs.   Family History   His family history includes Hypertension in an other family member.   Allergies No Known Allergies   Home Medications  Prior to Admission medications   Medication Sig Start Date End Date Taking? Authorizing  Provider  dexamethasone (DECADRON) 4 MG tablet Take 5 tablets (20 mg total) by mouth once a week. 11/18/18  Yes Brunetta Genera, MD  Fluticasone-Umeclidin-Vilant (TRELEGY ELLIPTA) 100-62.5-25 MCG/INH AEPB Inhale 1 puff into the lungs daily. 07/31/18  Yes Tanda Rockers, MD  OXYGEN 3L BEDTIME ONLY   Yes [provider]  predniSONE (DELTASONE) 10 MG tablet Take 1 tablet (10 mg total) by mouth daily. Taper dose: 5m po daily x 3 days, then 454mpo daily x 3 days, then 2058mo daily x 3 days, then 8m85m daily x 3 days, then 5mg 46mdaily x 2 days, then stop and resume decadron as previously prescribed 12/19/18  Yes Chiu,Donne Hazel PROVENTIL HFA 108 (90 Base) MCG/ACT inhaler INHALE 2 PUFFS EVERY 6 HOURS AS NEEDED FOR WHEEZING OR SHORTNESS OF BREATH. Patient taking differently: Inhale 2 puffs into the lungs every 6 (six) hours as needed for wheezing or shortness of breath.  10/20/18  Yes McQuaJuanito Doom sulfamethoxazole-trimethoprim (BACTRIM DS,SEPTRA DS) 800-160 MG tablet Take 1 tablet by mouth 2 (two) times daily. 12/30/18 01/13/19 Yes [provider]     Critical care time: 35 mins     Paul Georgann HousekeeperCNP-BC [facility] [phone] 6:19 PM         Paul Georgann HousekeeperCNP-BC [facility] [phone] 5:41 PM

## 2019-01-05 NOTE — ED Triage Notes (Signed)
Pt arrives with [facility] from home c/o overall weakness and SHOB that started around 0900 this morning. Pt had occupational therapy session at 0800 before he started feeling weak. Pt has hx of COPD, afib, and uses 3 L of O2 at home; Pt also reports being hx of being septic. A&o, VSS at this time.  EMS vitals: 117/74 HR 97 RR 16 CBG 92

## 2019-01-05 NOTE — ED Notes (Signed)
Did ekg on patient shown to Dr [name] [name] patient is resting with call bell in reach

## 2019-01-05 NOTE — ED Notes (Signed)
Rectal temp. 98.6 

## 2019-01-05 NOTE — Progress Notes (Signed)
[facility] Progress Note Patient Name: [name] [name] DOB: [birth year] MRN: [id number]   Date of Service  01/12/2019  HPI/Events of Note  BP 88/50 and lactate unchanged at 3.1, CXR without overt volume overload.  eICU Interventions  5 % Albumin 250 ml iv bolus x 1        Okoronkwo U Ogan 01/13/2019, 11:32 PM

## 2019-01-05 NOTE — Consult Note (Signed)
Reason for Consult:Gross Hematuria / Right Ureteral Mass, Hydronephrosis / Acute Renal Failure, Sepsis of Urinary Origin J Referring Physician: Karmen Bongo MD  [name] [name] is an 58 y.o. male.   HPI:   1- Gross Hematuria / Right Ureteral Mass - new gross hematuria 01/2018. INR normal. CT with right hydro / blood products in renal pelvis. CT 12/2018 with right upper ureter solid enhancing mass, new since imaging 2018. No endoscopic eval yet.  2 - Hydronephrosis / Acute Renal Failure - Right moderate hydro and Cr 1.6 (baseline <1.3) by ER eval 01/2019.  3 - Sepsis of Urinary Origin - recent h/o suspect MSSA pyelo with sepsis x several 2020. Bacteruria, elevated lactate by ER eval 01/2019 concerning for another episode. Placed on empiric maxipeme pending further CX data. [place], [place] 3/3 pending.  PMH sig for multiple myeloma, recurrent pneumona / COPD.   Today "[name] [name]" is seen in consultation for above.    Past Medical History:  Diagnosis Date  . Acute on chronic respiratory failure ([place]) 02/07/2018  . Hypertension   . Kidney stones   . Multiple myeloma ([place])   . Obstructive lung disease (generalized) ([place])   . Pulmonary hypertension ([place])     Past Surgical History:  Procedure Laterality Date  . CARDIAC CATHETERIZATION N/A 01/18/2016   Procedure: Right Heart Cath;  Surgeon: Jolaine Artist, MD;  Location: [facility];  Service: Cardiovascular;  Laterality: N/A;  . CARDIAC CATHETERIZATION N/A 06/24/2016   Procedure: Right Heart Cath;  Surgeon: Jolaine Artist, MD;  Location: [facility];  Service: Cardiovascular;  Laterality: N/A;  . IR RADIOLOGIST EVAL & MGMT  05/27/2018  . IR RADIOLOGIST EVAL & MGMT  09/01/2018  . KIDNEY SURGERY    . VIDEO ASSISTED THORACOSCOPY (VATS)/DECORTICATION Left 2003  . VIDEO ASSISTED THORACOSCOPY (VATS)/DECORTICATION Left 10/02/2015   Procedure: VIDEO ASSISTED THORACOSCOPY (VATS)/DECORTICATION and drainage of chronic empyena;  Surgeon: [name], MD;  Location: [facility];  Service: Thoracic;  Laterality: Left;  Marland Kitchen VIDEO BRONCHOSCOPY N/A 10/02/2015   Procedure: VIDEO BRONCHOSCOPY;  Surgeon: [name], MD;  Location: [facility] OR;  Service: Thoracic;  Laterality: N/A;    Family History  Problem Relation Age of Onset  . Hypertension Other     Social History:  reports that he has never smoked. He has never used smokeless tobacco. He reports previous alcohol use. He reports that he does not use drugs.  Allergies: No Known Allergies  Medications: I have reviewed the patient's current medications.  Results for orders placed or performed during the hospital encounter of 01/27/2019 (from the past 48 hour(s))  Urinalysis, Routine w reflex microscopic     Status: Abnormal   Collection Time: 01/21/2019 11:02 AM  Result Value Ref Range   Color, Urine RED (A) YELLOW    Comment: BIOCHEMICALS MAY BE AFFECTED BY COLOR CORRECTED ON 03/03 AT 1234: PREVIOUSLY REPORTED AS RED    APPearance BLOODY (A) CLEAR   Specific Gravity, Urine  1.005 - 1.030    TEST NOT REPORTED DUE TO COLOR INTERFERENCE OF URINE PIGMENT   pH  5.0 - 8.0    TEST NOT REPORTED DUE TO COLOR INTERFERENCE OF URINE PIGMENT   Glucose, UA (A) NEGATIVE mg/dL    TEST NOT REPORTED DUE TO COLOR INTERFERENCE OF URINE PIGMENT   Hgb urine dipstick (A) NEGATIVE    TEST NOT REPORTED DUE TO COLOR INTERFERENCE OF URINE PIGMENT   Bilirubin Urine (A) NEGATIVE    TEST  NOT REPORTED DUE TO COLOR INTERFERENCE OF URINE PIGMENT   Ketones, ur (A) NEGATIVE mg/dL    TEST NOT REPORTED DUE TO COLOR INTERFERENCE OF URINE PIGMENT   Protein, ur (A) NEGATIVE mg/dL    TEST NOT REPORTED DUE TO COLOR INTERFERENCE OF URINE PIGMENT   Nitrite (A) NEGATIVE    TEST NOT REPORTED DUE TO COLOR INTERFERENCE OF URINE PIGMENT   Leukocytes,Ua (A) NEGATIVE    TEST NOT REPORTED DUE TO COLOR INTERFERENCE OF URINE PIGMENT    Comment: Performed at [facility], [place] [address] [place], [place] [address]   Urinalysis, Microscopic (reflex)     Status: Abnormal   Collection Time: 01/22/2019 11:02 AM  Result Value Ref Range   RBC / HPF >50 0 - 5 RBC/hpf   WBC, UA >50 0 - 5 WBC/hpf   Bacteria, UA MANY (A) NONE SEEN   Squamous Epithelial / LPF NONE SEEN 0 - 5    Comment: Performed at [facility], [place] [address], [place], [place] [address]  Comprehensive metabolic panel     Status: Abnormal   Collection Time: 01/13/2019 11:24 AM  Result Value Ref Range   Sodium 131 (L) 135 - 145 mmol/L   Potassium 5.2 (H) 3.5 - 5.1 mmol/L   Chloride 90 (L) 98 - 111 mmol/L   CO2 37 (H) 22 - 32 mmol/L   Glucose, Bld 70 70 - 99 mg/dL   BUN 28 (H) 6 - 20 mg/dL   Creatinine, Ser 1.56 (H) 0.61 - 1.24 mg/dL   Calcium 8.2 (L) 8.9 - 10.3 mg/dL   Total Protein 6.4 (L) 6.5 - 8.1 g/dL   Albumin 2.0 (L) 3.5 - 5.0 g/dL   AST 18 15 - 41 U/L   ALT 10 0 - 44 U/L   Alkaline Phosphatase 81 38 - 126 U/L   Total Bilirubin 0.7 0.3 - 1.2 mg/dL   GFR calc non Af Amer 49 (L) >60 mL/min   GFR calc Af Amer 57 (L) >60 mL/min   Anion gap 4 (L) 5 - 15    Comment: Performed at [facility], [place] [address] [place], [place] [address]  Lipase, blood     Status: None   Collection Time: 01/31/2019 11:24 AM  Result Value Ref Range   Lipase 28 11 - 51 U/L    Comment: Performed at [facility], [place] [address], [place], [place] [address]  Brain natriuretic peptide     Status: Abnormal   Collection Time: 01/17/2019 11:24 AM  Result Value Ref Range   B Natriuretic Peptide 360.3 (H) 0.0 - 100.0 pg/mL    Comment: Performed at [facility] [address], [place], [place] [address]  Troponin I - Once     Status: Abnormal   Collection Time: 01/23/2019 11:24 AM  Result Value Ref Range   Troponin I 0.07 (HH) <0.03 ng/mL    Comment: CRITICAL RESULT CALLED TO, READ BACK BY AND VERIFIED WITHCandace Cruise [id number] [facility] Performed at [facility], [place] [address], [place], [place] [address]   Lactic acid, plasma      Status: Abnormal   Collection Time: 01/03/2019 11:24 AM  Result Value Ref Range   Lactic Acid, Venous 2.3 (HH) 0.5 - 1.9 mmol/L    Comment: CRITICAL RESULT CALLED TO, READ BACK BY AND VERIFIED WITH: N ZOHBI,RN AT 1224 01/07/2019 BY L BENFIELD Performed at [facility], [place]  [address], [place], [place] [address]   CBC with Differential     Status: Abnormal   Collection Time: 01/21/2019 11:24 AM  Result Value Ref Range   WBC 16.2 (H) 4.0 - 10.5 K/uL   RBC 3.96 (L) 4.22 - 5.81 MIL/uL   Hemoglobin 9.5 (L) 13.0 - 17.0 g/dL   HCT 29.9 (L) 39.0 - 52.0 %   MCV 75.5 (L) 80.0 - 100.0 fL   MCH 24.0 (L) 26.0 - 34.0 pg   MCHC 31.8 30.0 - 36.0 g/dL   RDW 16.8 (H) 11.5 - 15.5 %   Platelets 540 (H) 150 - 400 K/uL   nRBC 0.0 0.0 - 0.2 %   Neutrophils Relative % 87 %   Neutro Abs 14.2 (H) 1.7 - 7.7 K/uL   Lymphocytes Relative 5 %   Lymphs Abs 0.8 0.7 - 4.0 K/uL   Monocytes Relative 6 %   Monocytes Absolute 1.0 0.1 - 1.0 K/uL   Eosinophils Relative 1 %   Eosinophils Absolute 0.1 0.0 - 0.5 K/uL   Basophils Relative 0 %   Basophils Absolute 0.0 0.0 - 0.1 K/uL   Immature Granulocytes 1 %   Abs Immature Granulocytes 0.10 (H) 0.00 - 0.07 K/uL    Comment: Performed at [facility] [address] [place], [place] [address]  Protime-INR     Status: None   Collection Time: 01/20/2019 11:24 AM  Result Value Ref Range   Prothrombin Time 14.1 11.4 - 15.2 seconds   INR 1.1 0.8 - 1.2    Comment: (NOTE) INR goal varies based on device and disease states. Performed at [facility], [place] [address], [place], [place] [address]   Lactic acid, plasma     Status: Abnormal   Collection Time: 01/27/2019  1:14 PM  Result Value Ref Range   Lactic Acid, Venous 3.1 (HH) 0.5 - 1.9 mmol/L    Comment: CRITICAL RESULT CALLED TO, READ BACK BY AND VERIFIED WITH: Duanne Limerick AT 1348 01/24/2019 BY L BENFIELD Performed at [facility], [place] [address] [place], [place] [address]     Dg Chest Port 1  View  Result Date: 01/21/2019 CLINICAL DATA:  Weakness and shortness of Breath EXAM: PORTABLE CHEST 1 VIEW COMPARISON:  12/25/2018 FINDINGS: Cardiac shadow is stable. Patchy infiltrate is again noted in the bases bilaterally left greater than right. Persistent left effusion is seen with some air in the pleural space stable in appearance from the prior exam. No acute bony abnormality is seen. IMPRESSION: Stable bibasilar changes left greater than right. Electronically Signed   By: Inez Catalina M.D.   On: 01/12/2019 12:02   Ct Renal Stone Study  Result Date: 01/12/2019 CLINICAL DATA:  Diffuse weakness and shortness of breath since 9 a.m. this morning. Hematuria. Multiple myeloma. EXAM: CT ABDOMEN AND PELVIS WITHOUT CONTRAST TECHNIQUE: Multidetector CT imaging of the abdomen and pelvis was performed following the standard protocol without IV contrast. COMPARISON:  12/25/2018. 01/23/2018. PET-CT dated 10/21/2017. FINDINGS: Lower chest: The previously demonstrated cavitation in the right lower lobe is no longer seen with increased irregular linear consolidation in that region, measuring 6.2 x 1.5 cm on image number 5 series 4. There is less air and more fluid in the loculated fluid and air collection in the left pleural space with irregular areas of linear density within the fluid as well as irregular, coarse calcifications. There are also calcified pleural plaques at the right lung base. Hepatobiliary: No focal liver abnormality is seen.  No gallstones, gallbladder wall thickening, or biliary dilatation. Pancreas: Unremarkable. No pancreatic ductal dilatation or surrounding inflammatory changes. Spleen: Stable peripheral calcifications, most likely due to previous trauma. Normal size and shape of the spleen. Adrenals/Urinary Tract: Interval diffuse enlargement of the right kidney with interval areas of mildly increased density in the renal collecting system and pelvis and extending into the proximal right ureter. The  right renal collecting system is mildly to moderately dilated and the proximal ureter is moderately dilated. There is mild to moderate dilatation of the right ureter to the level of the ureterovesical junction, containing the mildly increased density material. There is similar interval material in the dependent portion of the urinary bladder, greater on the right, with a small amount of linear calcific density. Again demonstrated are bilateral renal cysts. Normal appearing adrenal glands and left ureter. Stomach/Bowel: Multiple mildly dilated loops of small bowel. Gas, stool and high density contrast in normal caliber colon. High density contrast in a normal appearing appendix. Unremarkable stomach. Vascular/Lymphatic: Mild atheromatous arterial calcifications without aneurysm. No enlarged lymph nodes. Reproductive: Mildly enlarged prostate gland. Other: No abdominal wall hernia or abnormality. No abdominopelvic ascites. Musculoskeletal: Stable mild lumbar and lower thoracic spine degenerative changes, including facet degenerative changes with associated stable grade 1 anterolisthesis at the L4-5 level. There is a vacuum phenomenon within that disc. Stable previously demonstrated lytic lesion in the L4 vertebral body. IMPRESSION: 1. Interval diffuse enlargement of the right kidney with interval areas of increased density in the right renal collecting system and right ureter, compatible with hemorrhage, causing mild to moderate right hydronephrosis and moderate right hydroureter. The enlargement of the kidney is compatible with a combination of hydronephrosis and previously demonstrated pyelonephritis and the hemorrhage is compatible with hemorrhage associated with pyelonephritis. 2. Interval blood in the dependent portion of the urinary bladder. 3. Mild small bowel ileus. Partial obstruction is less likely. 4. Interval decrease in size of the previously demonstrated cavitation in the right lower lobe with increased  irregular linear consolidation in that region compatible with developing scar tissue. 5. Decreased air and more fluid in the loculated left pleural fluid and air collection. 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 6. 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 7. Stable lytic lesion in the L4 vertebral body compatible with the patient's known multiple myeloma. Electronically Signed   By: Claudie Revering M.D.   On: 01/24/2019 16:10    Review of Systems  Constitutional: Positive for malaise/fatigue. Negative for fever.  HENT: Negative.   Eyes: Negative.   Respiratory: Negative.   Cardiovascular: Negative.   Gastrointestinal: Positive for abdominal pain.  Genitourinary: Positive for flank pain and hematuria.  Skin: Negative.   Neurological: Negative.   Endo/Heme/Allergies: Negative.   Psychiatric/Behavioral: Negative.    Blood pressure (!) 87/67, pulse 97, temperature 98.6 F (37 C), temperature source Rectal, resp. rate (!) 22, height 5' (1.524 m), weight 36.7 kg, SpO2 100 %. Physical Exam  Constitutional: He is oriented to person, place, and time.  Very thin, appears older than stated age. Pleasant and cooperative. English not first languange but he is conversant.   HENT:  Head: Normocephalic.  Eyes: Pupils are equal, round, and reactive to light.  Neck: Normal range of motion.  Cardiovascular:  Regular mild tachycardia 90s.   Respiratory: Effort normal.  GI: Soft.  Genitourinary:    Penis normal.     Genitourinary Comments: Mild Rt CVAT   Musculoskeletal: Normal range of motion.  Neurological: He is  alert and oriented to person, place, and time.  Skin: Skin is warm.  Psychiatric: He has a normal mood and affect.    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 Rec endoscopic eval this admission, tentaticely tomorrow at [facility].   Goal will be minimal ureterosocpy time, just enough to visualie / sample Rt proximal ureter v. Stent only if unable to visualize or declines clinically. Risks, benefits, alternatives (neph tube, not favored as could seed cancer) discussed.   2 - Hydronephrosis / Acute Renal Failure - likely some pre-renal and obstructive components. Agree with fluid recussitation and plan for renal decompression tomorrow.   3 - Sepsis of Urinary Origin - agree with maxipeme pending further CX data. Partial right renal obstruction and MM meds likely increasing his risk.   Alexis Frock 01/21/2019, 8:13 PM

## 2019-01-05 NOTE — ED Notes (Signed)
IV team at the bedside. 

## 2019-01-05 NOTE — ED Notes (Signed)
RN informed Pt can receive visitor  

## 2019-01-05 NOTE — H&P (Addendum)
History and Physical    [name] [name] OQH:[id number] DOB: 1961-07-20 DOA: 01/20/2019  PCP: [name], PA-C Consultants:  Irene Limbo - oncology; Lake Bells - pulmomology Patient coming from:  Home - lives with niece; NOK: Niece  Chief Complaint: SOB  HPI: [name] [name] is a 58 y.o. male with medical history significant of pulmonary HTN; COPD on 3L home O2; multiple myeloma on Revlimid; HTN;chronic left-sided hydropneumothorax, bronchiectasis, allergic bronchopulmonary aspergillosis, heterozygous alpha 1antitrypsindeficiency, originally was treated for empyema with VATS decortication in 2016.  presenting with SOB.  Since d/c, he is the same.  +SOB.  Occasional cough, nonproductive.  Gross hematuria here today, has not seen before.  R sided renal colic, present all the time.  Subjective fever with chills, diaphoresis.   ED Course:  Recent admitted for sepsis.  Readmitted on 2/21-23 with right hydronephrosis, ?stone obstruction.  Urology to see as outpatient.  After d/c, increased SOB, no appetite.  On exam, grossly bloody urine, mild lactic acidosis, WBC 16.1, AKI - creatinine 1.5, baseline <1. ?sepsis, possibly renal source.    Review of Systems: As per HPI; otherwise review of systems reviewed and negative.   Ambulatory Status:  Ambulates with a walker  Past Medical History:  Diagnosis Date  . Acute on chronic respiratory failure ([place]) 02/07/2018  . Hypertension   . Kidney stones   . Multiple myeloma ([place])   . Obstructive lung disease (generalized) ([place])   . Pulmonary hypertension ([place])     Past Surgical History:  Procedure Laterality Date  . CARDIAC CATHETERIZATION N/A 01/18/2016   Procedure: Right Heart Cath;  Surgeon: Jolaine Artist, MD;  Location: [facility];  Service: Cardiovascular;  Laterality: N/A;  . CARDIAC CATHETERIZATION N/A 06/24/2016   Procedure: Right Heart Cath;  Surgeon: Jolaine Artist, MD;  Location: [facility];  Service: Cardiovascular;  Laterality: N/A;   . IR RADIOLOGIST EVAL & MGMT  05/27/2018  . IR RADIOLOGIST EVAL & MGMT  09/01/2018  . KIDNEY SURGERY    . VIDEO ASSISTED THORACOSCOPY (VATS)/DECORTICATION Left 2003  . VIDEO ASSISTED THORACOSCOPY (VATS)/DECORTICATION Left 10/02/2015   Procedure: VIDEO ASSISTED THORACOSCOPY (VATS)/DECORTICATION and drainage of chronic empyena;  Surgeon: [name], MD;  Location: [facility];  Service: Thoracic;  Laterality: Left;  Marland Kitchen VIDEO BRONCHOSCOPY N/A 10/02/2015   Procedure: VIDEO BRONCHOSCOPY;  Surgeon: [name], MD;  Location: [facility];  Service: Thoracic;  Laterality: N/A;    Social History   Socioeconomic History  . Marital status: Single    Spouse name: Not on file  . Number of children: Not on file  . Years of education: Not on file  . Highest education level: Not on file  Occupational History  . Occupation: Passenger transport manager  Social Needs  . Financial resource strain: Not on file  . Food insecurity:    Worry: Not on file    Inability: Not on file  . Transportation needs:    Medical: Not on file    Non-medical: Not on file  Tobacco Use  . Smoking status: Never Smoker  . Smokeless tobacco: Never Used  Substance and Sexual Activity  . Alcohol use: Not Currently  . Drug use: No  . Sexual activity: Yes  Lifestyle  . Physical activity:    Days per week: Not on file    Minutes per session: Not on file  . Stress: Not on file  Relationships  . Social connections:    Talks on phone: Not on file  Gets together: Not on file    Attends religious service: Not on file    Active member of club or organization: Not on file    Attends meetings of clubs or organizations: Not on file    Relationship status: Not on file  . Intimate partner violence:    Fear of current or ex partner: Not on file    Emotionally abused: Not on file    Physically abused: Not on file    Forced sexual activity: Not on file  Other Topics Concern  . Not on file  Social History Narrative  . Not on file     No Known Allergies  Family History  Problem Relation Age of Onset  . Hypertension Other     Prior to Admission medications   Medication Sig Start Date End Date Taking? Authorizing Provider  dexamethasone (DECADRON) 4 MG tablet Take 5 tablets (20 mg total) by mouth once a week. 11/18/18  Yes Brunetta Genera, MD  Fluticasone-Umeclidin-Vilant (TRELEGY ELLIPTA) 100-62.5-25 MCG/INH AEPB Inhale 1 puff into the lungs daily. 07/31/18  Yes Tanda Rockers, MD  OXYGEN 3L BEDTIME ONLY   Yes [provider]  predniSONE (DELTASONE) 10 MG tablet Take 1 tablet (10 mg total) by mouth daily. Taper dose: 84m po daily x 3 days, then 462mpo daily x 3 days, then 2086mo daily x 3 days, then 60m76m daily x 3 days, then 5mg 33mdaily x 2 days, then stop and resume decadron as previously prescribed 12/19/18  Yes Chiu,Donne Hazel PROVENTIL HFA 108 (90 Base) MCG/ACT inhaler INHALE 2 PUFFS EVERY 6 HOURS AS NEEDED FOR WHEEZING OR SHORTNESS OF BREATH. Patient taking differently: Inhale 2 puffs into the lungs every 6 (six) hours as needed for wheezing or shortness of breath.  10/20/18  Yes McQuaJuanito Doom sulfamethoxazole-trimethoprim (BACTRIM DS,SEPTRA DS) 800-160 MG tablet Take 1 tablet by mouth 2 (two) times daily. 12/30/18 01/13/19 Yes [provider]  acyclovir (ZOVIRAX) 400 MG tablet Take 1 tablet (400 mg total) by mouth daily. Patient not taking: Reported on 01/09/2019 11/18/18   Kale,Brunetta Genera aspirin EC 81 MG tablet Take 1 tablet (81 mg total) by mouth daily. Patient not taking: Reported on 01/09/2019 11/18/18   Kale,Brunetta Genera docusate sodium (COLACE) 50 MG capsule Take 1 capsule (50 mg total) by mouth daily. Patient not taking: Reported on 12/25/2018 03/06/18   GomezClent DemarkC  guaiFENesin (MUCINEX) 600 MG 12 hr tablet Take 2 tablets (1,200 mg total) by mouth 2 (two) times daily. Patient not taking: Reported on 01/23/2019 09/05/18   Lama,Oswald Hillock    Physical Exam: Vitals:   01/31/2019 1445 01/14/2019 1545 01/16/2019 1645 01/27/2019 1653  BP: 92/75 101/85 (!) 60/48 (!) 82/52  Pulse: 94 (!) 101 (!) 130 (!) 125  Resp: (!) 31 (!) 31 (!) 30 (!) 25  Temp:      TempSrc:      SpO2: 100% 100% 100% 100%  Weight:      Height:         . General: Ill-appearing with rigors on most recent exam, previously ill but more comfortable in appearance . Eyes:  PERRL, EOMI, normal lids, iris . ENT:  grossly normal hearing, lips & tongue, dry mm . Neck:  no LAD, masses or thyromegaly . Cardiovascular:  RRR, no m/r/g at time of initial exam; now febrile with sinus tachycardia with rate 127. No  LE edema.  Marland Kitchen Respiratory:   CTA bilaterally with no wheezes/rales/rhonchi.  Normal respiratory effort. . Abdomen:  soft, NT other than suprapubic region, ND, NABS . Back:   normal alignment, R CVAT . Skin:  no rash or induration seen on limited exam . Musculoskeletal:  grossly normal tone BUE/BLE, good ROM, no bony abnormality . Psychiatric:  grossly normal mood and affect, speech fluent and appropriate, AOx3 . Neurologic:  CN 2-12 grossly intact, moves all extremities in coordinated fashion, sensation intact    Radiological Exams on Admission: Dg Chest Port 1 View  Result Date: 01/27/2019 CLINICAL DATA:  Weakness and shortness of Breath EXAM: PORTABLE CHEST 1 VIEW COMPARISON:  12/25/2018 FINDINGS: Cardiac shadow is stable. Patchy infiltrate is again noted in the bases bilaterally left greater than right. Persistent left effusion is seen with some air in the pleural space stable in appearance from the prior exam. No acute bony abnormality is seen. IMPRESSION: Stable bibasilar changes left greater than right. Electronically Signed   By: Inez Catalina M.D.   On: 01/13/2019 12:02   Ct Renal Stone Study  Result Date: 01/10/2019 CLINICAL DATA:  Diffuse weakness and shortness of breath since 9 a.m. this morning. Hematuria. Multiple myeloma. EXAM: CT ABDOMEN AND PELVIS  WITHOUT CONTRAST TECHNIQUE: Multidetector CT imaging of the abdomen and pelvis was performed following the standard protocol without IV contrast. COMPARISON:  12/25/2018. 01/23/2018. PET-CT dated 10/21/2017. FINDINGS: Lower chest: The previously demonstrated cavitation in the right lower lobe is no longer seen with increased irregular linear consolidation in that region, measuring 6.2 x 1.5 cm on image number 5 series 4. There is less air and more fluid in the loculated fluid and air collection in the left pleural space with irregular areas of linear density within the fluid as well as irregular, coarse calcifications. There are also calcified pleural plaques at the right lung base. Hepatobiliary: No focal liver abnormality is seen. No gallstones, gallbladder wall thickening, or biliary dilatation. Pancreas: Unremarkable. No pancreatic ductal dilatation or surrounding inflammatory changes. Spleen: Stable peripheral calcifications, most likely due to previous trauma. Normal size and shape of the spleen. Adrenals/Urinary Tract: Interval diffuse enlargement of the right kidney with interval areas of mildly increased density in the renal collecting system and pelvis and extending into the proximal right ureter. The right renal collecting system is mildly to moderately dilated and the proximal ureter is moderately dilated. There is mild to moderate dilatation of the right ureter to the level of the ureterovesical junction, containing the mildly increased density material. There is similar interval material in the dependent portion of the urinary bladder, greater on the right, with a small amount of linear calcific density. Again demonstrated are bilateral renal cysts. Normal appearing adrenal glands and left ureter. Stomach/Bowel: Multiple mildly dilated loops of small bowel. Gas, stool and high density contrast in normal caliber colon. High density contrast in a normal appearing appendix. Unremarkable stomach.  Vascular/Lymphatic: Mild atheromatous arterial calcifications without aneurysm. No enlarged lymph nodes. Reproductive: Mildly enlarged prostate gland. Other: No abdominal wall hernia or abnormality. No abdominopelvic ascites. Musculoskeletal: Stable mild lumbar and lower thoracic spine degenerative changes, including facet degenerative changes with associated stable grade 1 anterolisthesis at the L4-5 level. There is a vacuum phenomenon within that disc. Stable previously demonstrated lytic lesion in the L4 vertebral body. IMPRESSION: 1. Interval diffuse enlargement of the right kidney with interval areas of increased density in the right renal collecting system and right ureter, compatible with hemorrhage, causing mild to  moderate right hydronephrosis and moderate right hydroureter. The enlargement of the kidney is compatible with a combination of hydronephrosis and previously demonstrated pyelonephritis and the hemorrhage is compatible with hemorrhage associated with pyelonephritis. 2. Interval blood in the dependent portion of the urinary bladder. 3. Mild small bowel ileus. Partial obstruction is less likely. 4. Interval decrease in size of the previously demonstrated cavitation in the right lower lobe with increased irregular linear consolidation in that region compatible with developing scar tissue. 5. Decreased air and more fluid in the loculated left pleural fluid and air collection. 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 6. 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 7. Stable lytic lesion in the L4 vertebral body compatible with the patient's known multiple myeloma. Electronically Signed   By: Claudie Revering M.D.   On: 01/21/2019 16:10    EKG: Independently reviewed.  NSR with rate 89; nonspecific ST changes with no evidence of acute ischemia   Labs on Admission: I have personally reviewed the available labs and imaging studies at the time of the  admission.  Pertinent labs:   Na++ 131 K+ 5.2 CO2 37 BUN 28/Creatinine 1.56/GFR 49; 15/1.08/>60 Albumin 2.0 BNP 360.3 Troponin 0.07 Lactate 2.3, 3.1 WBC 16.2 Hgb 9.5 Platelets 540 INR 1.1  Assessment/Plan Active Problems:   Multiple myeloma (HCC)   Grade II diastolic dysfunction   Bacteremia due to methicillin susceptible Staphylococcus aureus (MSSA)   Septic shock (HCC)   AKI (acute kidney injury) ([place])    Septic shock due to pyelonephritis with recent h/o MSSA bacteremia -SIRS criteria in this patient includes: Leukocytosis, fever, tachycardia, tachypnea  -Patient has evidence of acute organ failure with elevated lactate and has developed septic shock -Sepsis protocol initiated -Patient has received the 30 cc/kg IVF bolus; he is currently receiving an additional 1L of IVF. -Suspected source is pyelonephritis - despite home treatment with Bactrim since recent hospitalization -Blood and urine cultures pending -Will admit to [facility] ICU -Will admit due to: hemodynamic instability; recent h/o bacteremia; failure of outpatient treatment -Treat with IV Cefepime/Vanc/Flagyl for broad coverage including anaerobes and MRSA in a patient with septic shock and recent bacteremia -Will give stress-dosed hydrocortisone at 50 mg q6h -Will trend lactate to ensure improvement -PCCM consult for now to assess for need for pressors  Renal hemorrhage  -Patient discussed with Dr. Tresa Moore, who will come to see him -This hemorrhage may require embolization -This is likely the result of pyelonephritis with intrarenal abscess resulting from recent MSSA bacteremia that likely seeded his kidney. -The images also raise concern for underlying ureteral cancer; he will need a cytoscopy when hemodynamically stable.   -He will need admission to [facility].  -For now, will treat with broad-spectrum pyelo coverage.   AKI -Likely resulting from sepsis with shock -Aggressive IVF hydration -Recheck BMP in  AM  Multiple myeloma -Revlimid on hold for now -Dr. Irene Limbo will be added to treatment team  Grade 2 diastolic dysfunction -Will need to monitor for volume overload - but this is a lesser consideration at this time     DVT prophylaxis:  SCDs Code Status:  Full - Family Communication: None present Disposition Plan:  Home once clinically improved Consults called: PCCM; Urology Admission status: Admit - It is my clinical opinion that admission to INPATIENT is reasonable and necessary because of the expectation that this patient will require hospital care that crosses at least 2 midnights to treat this condition based on the medical complexity of the problems presented.  Given the aforementioned information, the predictability of an adverse outcome is felt to be significant.   Total critical care time: 70 minutes Critical care time was exclusive of separately billable procedures and treating other patients. Critical care was necessary to treat or prevent imminent or life-threatening deterioration. Critical care was time spent personally by me on the following activities: development of treatment plan with patient and/or surrogate as well as nursing, discussions with consultants, evaluation of patient's response to treatment, examination of patient, obtaining history from patient or surrogate, ordering and performing treatments and interventions, ordering and review of laboratory studies, ordering and review of radiographic studies, pulse oximetry and re-evaluation of patient's condition.   Karmen Bongo MD [facility]   How to contact the [facility] Attending or Consulting provider [place] or covering provider during after hours [place], for this patient?  1. Check the care team in [facility] and look for a) attending/consulting [facility] provider listed and b) the [facility] team listed 2. Log into [email or web address] and use [place]'s universal password to access. If you do not have the password, please contact  the hospital operator. 3. Locate the [facility] provider you are looking for under [facility] and page to a number that you can be directly reached. 4. If you still have difficulty reaching the provider, please page the [facility] (Director on Call) for the Hospitalists listed on amion for assistance.   01/07/2019, 5:04 PM

## 2019-01-05 NOTE — ED Notes (Signed)
Hospitalist Dr. Lorin Mercy aware of pt's hypotension. New consult to CCM placed by Dr. Lorin Mercy.

## 2019-01-05 NOTE — ED Provider Notes (Signed)
[facility] EMERGENCY DEPARTMENT Provider Note   CSN: [id number] Arrival date & time: 01/04/2019  3664    History   Chief Complaint Chief Complaint  Patient presents with  . Weakness  . Shortness of Breath    HPI [name] [name] is a 58 y.o. male.     HPI Patient reports feeling increasingly short of breath.  He has known history of COPD on 3 L of home oxygen.  He reports this feels different and he feels more short of breath.  He describes a general fullness sensation in his chest.  He denies he has actual pain.  Patient denies he has had a fever.  He recently was admitted for sepsis.  He denies pain or swelling in his legs.  He reports he has had no appetite for 3 days.  He reports he is drinking Ensure. Past Medical History:  Diagnosis Date  . Acute on chronic respiratory failure ([place]) 02/07/2018  . Hypertension   . Kidney stones   . Multiple myeloma ([place])   . Obstructive lung disease (generalized) ([place])   . Pulmonary hypertension [facility])     Patient Active Problem List   Diagnosis Date Noted  . Septic shock ([place]) 01/14/2019  . Hydronephrosis of right kidney 12/25/2018  . Acute respiratory distress   . Alpha-1-antitrypsin deficiency ([place]) 12/07/2018  . Bronchiectasis ([place]) 12/07/2018  . Bacteremia due to methicillin susceptible Staphylococcus aureus (MSSA) 12/07/2018  . Sepsis ([place]) 12/06/2018  . Protein-calorie malnutrition, severe 09/05/2018  . Community acquired pneumonia 09/03/2018  . Grade II diastolic dysfunction 40/34/7425  . Acute on chronic respiratory failure with hypoxia ([place]) 01/27/2018  . Counseling regarding advanced care planning and goals of care 11/08/2017  . Multiple myeloma ([place]) 11/07/2017  . Chronic respiratory failure with hypoxia ([place]) 03/26/2016  . COPD GOLD IV criteria but never smoked  03/11/2016  . Pulmonary HTN ([place])   . Pleural effusion, left 09/26/2015  . Viral hepatitis C 08/06/2015    Past Surgical History:  Procedure  Laterality Date  . CARDIAC CATHETERIZATION N/A 01/18/2016   Procedure: Right Heart Cath;  Surgeon: Jolaine Artist, MD;  Location: [facility];  Service: Cardiovascular;  Laterality: N/A;  . CARDIAC CATHETERIZATION N/A 06/24/2016   Procedure: Right Heart Cath;  Surgeon: Jolaine Artist, MD;  Location: [facility];  Service: Cardiovascular;  Laterality: N/A;  . IR RADIOLOGIST EVAL & MGMT  05/27/2018  . IR RADIOLOGIST EVAL & MGMT  09/01/2018  . KIDNEY SURGERY    . VIDEO ASSISTED THORACOSCOPY (VATS)/DECORTICATION Left 2003  . VIDEO ASSISTED THORACOSCOPY (VATS)/DECORTICATION Left 10/02/2015   Procedure: VIDEO ASSISTED THORACOSCOPY (VATS)/DECORTICATION and drainage of chronic empyena;  Surgeon: [name], MD;  Location: [facility];  Service: Thoracic;  Laterality: Left;  Marland Kitchen VIDEO BRONCHOSCOPY N/A 10/02/2015   Procedure: VIDEO BRONCHOSCOPY;  Surgeon: [name], MD;  Location: [facility] OR;  Service: Thoracic;  Laterality: N/A;        Home Medications    Prior to Admission medications   Medication Sig Start Date End Date Taking? Authorizing Provider  dexamethasone (DECADRON) 4 MG tablet Take 5 tablets (20 mg total) by mouth once a week. 11/18/18  Yes Brunetta Genera, MD  Fluticasone-Umeclidin-Vilant (TRELEGY ELLIPTA) 100-62.5-25 MCG/INH AEPB Inhale 1 puff into the lungs daily. 07/31/18  Yes Tanda Rockers, MD  OXYGEN 3L BEDTIME ONLY   Yes [provider]  predniSONE (DELTASONE) 10 MG tablet Take 1 tablet (10 mg total) by mouth  daily. Taper dose: '60mg'$  po daily x 3 days, then '40mg'$  po daily x 3 days, then '20mg'$  po daily x 3 days, then '10mg'$  po daily x 3 days, then '5mg'$  po daily x 2 days, then stop and resume decadron as previously prescribed 12/19/18  Yes Donne Hazel, MD  PROVENTIL HFA 108 (90 Base) MCG/ACT inhaler INHALE 2 PUFFS EVERY 6 HOURS AS NEEDED FOR WHEEZING OR SHORTNESS OF BREATH. Patient taking differently: Inhale 2 puffs into the lungs every 6 (six) hours  as needed for wheezing or shortness of breath.  10/20/18  Yes Juanito Doom, MD  sulfamethoxazole-trimethoprim (BACTRIM DS,SEPTRA DS) 800-160 MG tablet Take 1 tablet by mouth 2 (two) times daily. 12/30/18 01/13/19 Yes [provider]  acyclovir (ZOVIRAX) 400 MG tablet Take 1 tablet (400 mg total) by mouth daily. Patient not taking: Reported on 02/02/2019 11/18/18   Brunetta Genera, MD  aspirin EC 81 MG tablet Take 1 tablet (81 mg total) by mouth daily. Patient not taking: Reported on 01/21/2019 11/18/18   Brunetta Genera, MD  docusate sodium (COLACE) 50 MG capsule Take 1 capsule (50 mg total) by mouth daily. Patient not taking: Reported on 12/25/2018 03/06/18   [name], PA-C  guaiFENesin (MUCINEX) 600 MG 12 hr tablet Take 2 tablets (1,200 mg total) by mouth 2 (two) times daily. Patient not taking: Reported on 01/16/2019 09/05/18   Oswald Hillock, MD    Family History Family History  Problem Relation Age of Onset  . Hypertension Other     Social History Social History   Tobacco Use  . Smoking status: Never Smoker  . Smokeless tobacco: Never Used  Substance Use Topics  . Alcohol use: Not Currently  . Drug use: No     Allergies   Patient has no known allergies.   Review of Systems Review of Systems 10 Systems reviewed and are negative for acute change except as noted in the HPI.   Physical Exam Updated Vital Signs BP 92/75   Pulse 94   Temp 98 F (36.7 C) (Oral) Comment: temp rechecked  Resp (!) 31   Ht 5' (1.524 m)   Wt 36.7 kg   SpO2 100%   BMI 15.82 kg/m   Physical Exam Constitutional:      Comments: Alert clear mental status.  No significant respiratory distress at rest.  On supplemental nasal cannula oxygen.  Cachexia.  HENT:     Head: Normocephalic.     Mouth/Throat:     Mouth: Mucous membranes are moist.     Pharynx: Oropharynx is clear.  Eyes:     Extraocular Movements: Extraocular movements intact.  Cardiovascular:     Rate and  Rhythm: Normal rate and regular rhythm.     Heart sounds: Murmur present.     Comments: 3\6 systolic ejection murmur. Pulmonary:     Comments: Mild increased work of breathing.  Crackles to midlung fields. Abdominal:     General: There is no distension.     Palpations: Abdomen is soft.     Tenderness: There is no abdominal tenderness. There is no guarding.  Musculoskeletal:     Comments: Cachexia extremities without peripheral edema or calf tenderness.  Skin:    General: Skin is warm and dry.  Neurological:     General: No focal deficit present.     Mental Status: He is oriented to person, place, and time.     Coordination: Coordination normal.  Psychiatric:  Mood and Affect: Mood normal.      ED Treatments / Results  Labs (all labs ordered are listed, but only abnormal results are displayed) Labs Reviewed  COMPREHENSIVE METABOLIC PANEL - Abnormal; Notable for the following components:      Result Value   Sodium 131 (*)    Potassium 5.2 (*)    Chloride 90 (*)    CO2 37 (*)    BUN 28 (*)    Creatinine, Ser 1.56 (*)    Calcium 8.2 (*)    Total Protein 6.4 (*)    Albumin 2.0 (*)    GFR calc non Af Amer 49 (*)    GFR calc Af Amer 57 (*)    Anion gap 4 (*)    All other components within normal limits  BRAIN NATRIURETIC PEPTIDE - Abnormal; Notable for the following components:   B Natriuretic Peptide 360.3 (*)    All other components within normal limits  TROPONIN I - Abnormal; Notable for the following components:   Troponin I 0.07 (*)    All other components within normal limits  LACTIC ACID, PLASMA - Abnormal; Notable for the following components:   Lactic Acid, Venous 2.3 (*)    All other components within normal limits  LACTIC ACID, PLASMA - Abnormal; Notable for the following components:   Lactic Acid, Venous 3.1 (*)    All other components within normal limits  CBC WITH DIFFERENTIAL/PLATELET - Abnormal; Notable for the following components:   WBC 16.2 (*)     RBC 3.96 (*)    Hemoglobin 9.5 (*)    HCT 29.9 (*)    MCV 75.5 (*)    MCH 24.0 (*)    RDW 16.8 (*)    Platelets 540 (*)    Neutro Abs 14.2 (*)    Abs Immature Granulocytes 0.10 (*)    All other components within normal limits  URINALYSIS, ROUTINE W REFLEX MICROSCOPIC - Abnormal; Notable for the following components:   Color, Urine RED (*)    APPearance BLOODY (*)    Glucose, UA   (*)    Value: TEST NOT REPORTED DUE TO COLOR INTERFERENCE OF URINE PIGMENT   Hgb urine dipstick   (*)    Value: TEST NOT REPORTED DUE TO COLOR INTERFERENCE OF URINE PIGMENT   Bilirubin Urine   (*)    Value: TEST NOT REPORTED DUE TO COLOR INTERFERENCE OF URINE PIGMENT   Ketones, ur   (*)    Value: TEST NOT REPORTED DUE TO COLOR INTERFERENCE OF URINE PIGMENT   Protein, ur   (*)    Value: TEST NOT REPORTED DUE TO COLOR INTERFERENCE OF URINE PIGMENT   Nitrite   (*)    Value: TEST NOT REPORTED DUE TO COLOR INTERFERENCE OF URINE PIGMENT   Leukocytes,Ua   (*)    Value: TEST NOT REPORTED DUE TO COLOR INTERFERENCE OF URINE PIGMENT   All other components within normal limits  URINALYSIS, MICROSCOPIC (REFLEX) - Abnormal; Notable for the following components:   Bacteria, UA MANY (*)    All other components within normal limits  CULTURE, BLOOD (ROUTINE X 2)  CULTURE, BLOOD (ROUTINE X 2)  URINE CULTURE  LIPASE, BLOOD  PROTIME-INR    EKG EKG Interpretation  Date/Time:  Tuesday January 05 2019 09:35:46 EST Ventricular Rate:  89 PR Interval:    QRS Duration: 85 QT Interval:  339 QTC Calculation: 413 R Axis:   92 Text Interpretation:  Sinus rhythm Probable left atrial enlargement  Borderline right axis deviation Nonspecific T abnrm, anterolateral leads normalization or anterior t wave inversion seen previously Confirmed by Charlesetta Shanks [phone]) on 01/20/2019 4:53:46 PM   Radiology Dg Chest Port 1 View  Result Date: 01/18/2019 CLINICAL DATA:  Weakness and shortness of Breath EXAM: PORTABLE CHEST 1 VIEW  COMPARISON:  12/25/2018 FINDINGS: Cardiac shadow is stable. Patchy infiltrate is again noted in the bases bilaterally left greater than right. Persistent left effusion is seen with some air in the pleural space stable in appearance from the prior exam. No acute bony abnormality is seen. IMPRESSION: Stable bibasilar changes left greater than right. Electronically Signed   By: Inez Catalina M.D.   On: 02/01/2019 12:02   Ct Renal Stone Study  Result Date: 01/04/2019 CLINICAL DATA:  Diffuse weakness and shortness of breath since 9 a.m. this morning. Hematuria. Multiple myeloma. EXAM: CT ABDOMEN AND PELVIS WITHOUT CONTRAST TECHNIQUE: Multidetector CT imaging of the abdomen and pelvis was performed following the standard protocol without IV contrast. COMPARISON:  12/25/2018. 01/23/2018. PET-CT dated 10/21/2017. FINDINGS: Lower chest: The previously demonstrated cavitation in the right lower lobe is no longer seen with increased irregular linear consolidation in that region, measuring 6.2 x 1.5 cm on image number 5 series 4. There is less air and more fluid in the loculated fluid and air collection in the left pleural space with irregular areas of linear density within the fluid as well as irregular, coarse calcifications. There are also calcified pleural plaques at the right lung base. Hepatobiliary: No focal liver abnormality is seen. No gallstones, gallbladder wall thickening, or biliary dilatation. Pancreas: Unremarkable. No pancreatic ductal dilatation or surrounding inflammatory changes. Spleen: Stable peripheral calcifications, most likely due to previous trauma. Normal size and shape of the spleen. Adrenals/Urinary Tract: Interval diffuse enlargement of the right kidney with interval areas of mildly increased density in the renal collecting system and pelvis and extending into the proximal right ureter. The right renal collecting system is mildly to moderately dilated and the proximal ureter is moderately  dilated. There is mild to moderate dilatation of the right ureter to the level of the ureterovesical junction, containing the mildly increased density material. There is similar interval material in the dependent portion of the urinary bladder, greater on the right, with a small amount of linear calcific density. Again demonstrated are bilateral renal cysts. Normal appearing adrenal glands and left ureter. Stomach/Bowel: Multiple mildly dilated loops of small bowel. Gas, stool and high density contrast in normal caliber colon. High density contrast in a normal appearing appendix. Unremarkable stomach. Vascular/Lymphatic: Mild atheromatous arterial calcifications without aneurysm. No enlarged lymph nodes. Reproductive: Mildly enlarged prostate gland. Other: No abdominal wall hernia or abnormality. No abdominopelvic ascites. Musculoskeletal: Stable mild lumbar and lower thoracic spine degenerative changes, including facet degenerative changes with associated stable grade 1 anterolisthesis at the L4-5 level. There is a vacuum phenomenon within that disc. Stable previously demonstrated lytic lesion in the L4 vertebral body. IMPRESSION: 1. Interval diffuse enlargement of the right kidney with interval areas of increased density in the right renal collecting system and right ureter, compatible with hemorrhage, causing mild to moderate right hydronephrosis and moderate right hydroureter. The enlargement of the kidney is compatible with a combination of hydronephrosis and previously demonstrated pyelonephritis and the hemorrhage is compatible with hemorrhage associated with pyelonephritis. 2. Interval blood in the dependent portion of the urinary bladder. 3. Mild small bowel ileus. Partial obstruction is less likely. 4. Interval decrease in size of the previously demonstrated cavitation  in the right lower lobe with increased irregular linear consolidation in that region compatible with developing scar tissue. 5. Decreased  air and more fluid in the loculated left pleural fluid and air collection. 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 6. 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 7. Stable lytic lesion in the L4 vertebral body compatible with the patient's known multiple myeloma. Electronically Signed   By: Claudie Revering M.D.   On: 01/15/2019 16:10    Procedures Procedures (including critical care time) CRITICAL CARE Performed by: Charlesetta Shanks   Total critical care time: 45 minutes  Critical care time was exclusive of separately billable procedures and treating other patients.  Critical care was necessary to treat or prevent imminent or life-threatening deterioration.  Critical care was time spent personally by me on the following activities: development of treatment plan with patient and/or surrogate as well as nursing, discussions with consultants, evaluation of patient's response to treatment, examination of patient, obtaining history from patient or surrogate, ordering and performing treatments and interventions, ordering and review of laboratory studies, ordering and review of radiographic studies, pulse oximetry and re-evaluation of patient's condition. Medications Ordered in ED Medications  ceFEPIme (MAXIPIME) 1 g in sodium chloride 0.9 % 100 mL IVPB (has no administration in time range)  sodium chloride 0.9 % bolus 1,000 mL (1,000 mLs Intravenous New Bag/Given 01/15/2019 1458)    And  sodium chloride 0.9 % bolus 250 mL (250 mLs Intravenous New Bag/Given 01/15/2019 1502)  ceFEPIme (MAXIPIME) 2 g in sodium chloride 0.9 % 100 mL IVPB (2 g Intravenous New Bag/Given 01/12/2019 1502)     Initial Impression / Assessment and Plan / ED Course  I have reviewed the triage vital signs and the nursing notes.  Pertinent labs & imaging results that were available during my care of the patient were reviewed by me and considered in my medical decision making (see chart for  details).       Patient has severe comorbid illness.  He presents with increasing general weakness and shortness of breath.  Urinalysis is grossly positive.  Patient had mild lactic acidosis and leukocytosis.  I suspicion for progressive pyelonephritis based on visit 2 weeks ago showing right perinephric stranding.  Sepsis protocol initiated for patient.  Patient admitted.  CT obtained and reviewed by Dr. Lorin Mercy admitting physician.  Patient has complex renal hemorrhage and pyelonephritis.  Patient will need to be transferred to [facility] on per Dr. Narda Amber consultations with urology.  Final Clinical Impressions(s) / ED Diagnoses   Final diagnoses:  Pyelonephritis  Renal hemorrhage, right  Severe comorbid illness    ED Discharge Orders    None       Charlesetta Shanks, MD 01/04/2019 1655

## 2019-01-05 NOTE — Progress Notes (Signed)
eLink Physician-Brief Progress Note Patient Name: [name] [name] DOB: 1961-06-30 MRN: [id number]   Date of Service  01/22/2019  HPI/Events of Note    eICU Interventions  New patient evaluation        Frederik Pear 01/17/2019, 9:34 PM

## 2019-01-05 NOTE — ED Notes (Signed)
Critical care bedside 

## 2019-01-05 NOTE — Progress Notes (Signed)
CRITICAL VALUE ALERT  Critical Value:  Lactic acid 3.2  Date & Time Notied:  01/11/2019 & 2300  Provider Notified: yes  Orders Received/Actions taken: new orders received and initiated

## 2019-01-06 ENCOUNTER — Inpatient Hospital Stay (HOSPITAL_COMMUNITY): Payer: Medicare Other | Admitting: Anesthesiology

## 2019-01-06 ENCOUNTER — Other Ambulatory Visit: Payer: Self-pay

## 2019-01-06 ENCOUNTER — Encounter (HOSPITAL_COMMUNITY): Admission: EM | Disposition: E | Payer: Self-pay | Source: Home / Self Care | Attending: Pulmonary Disease

## 2019-01-06 ENCOUNTER — Inpatient Hospital Stay (HOSPITAL_COMMUNITY): Payer: Medicare Other

## 2019-01-06 ENCOUNTER — Ambulatory Visit: Payer: Self-pay

## 2019-01-06 ENCOUNTER — Ambulatory Visit: Admit: 2019-01-06 | Payer: Medicare Other | Admitting: Urology

## 2019-01-06 DIAGNOSIS — R7881 Bacteremia: Secondary | ICD-10-CM

## 2019-01-06 DIAGNOSIS — L8990 Pressure ulcer of unspecified site, unspecified stage: Secondary | ICD-10-CM | POA: Diagnosis present

## 2019-01-06 DIAGNOSIS — A419 Sepsis, unspecified organism: Secondary | ICD-10-CM | POA: Insufficient documentation

## 2019-01-06 HISTORY — PX: CYSTOSCOPY WITH RETROGRADE PYELOGRAM, URETEROSCOPY AND STENT PLACEMENT: SHX5789

## 2019-01-06 LAB — BLOOD GAS, ARTERIAL
Acid-base deficit: 4 mmol/L — ABNORMAL HIGH (ref 0.0–2.0)
Bicarbonate: 22.3 mmol/L (ref 20.0–28.0)
Drawn by: 270211
FIO2: 1
MECHVT: 400 mL
O2 Saturation: 99 %
PEEP: 5 cmH2O
Patient temperature: 98.6
RATE: 16 resp/min
pCO2 arterial: 49.5 mmHg — ABNORMAL HIGH (ref 32.0–48.0)
pH, Arterial: 7.277 — ABNORMAL LOW (ref 7.350–7.450)
pO2, Arterial: 294 mmHg — ABNORMAL HIGH (ref 83.0–108.0)

## 2019-01-06 LAB — BASIC METABOLIC PANEL
Anion gap: 14 (ref 5–15)
BUN: 28 mg/dL — ABNORMAL HIGH (ref 6–20)
CO2: 19 mmol/L — ABNORMAL LOW (ref 22–32)
Calcium: 7 mg/dL — ABNORMAL LOW (ref 8.9–10.3)
Chloride: 102 mmol/L (ref 98–111)
Creatinine, Ser: 1.49 mg/dL — ABNORMAL HIGH (ref 0.61–1.24)
GFR calc Af Amer: 60 mL/min — ABNORMAL LOW (ref >60)
GFR calc non Af Amer: 52 mL/min — ABNORMAL LOW (ref >60)
Glucose, Bld: 137 mg/dL — ABNORMAL HIGH (ref 70–99)
Potassium: 5.9 mmol/L — ABNORMAL HIGH (ref 3.5–5.1)
SODIUM: 135 mmol/L (ref 135–145)

## 2019-01-06 LAB — CBC
HCT: 27.2 % — ABNORMAL LOW (ref 39.0–52.0)
HEMOGLOBIN: 8.4 g/dL — AB (ref 13.0–17.0)
MCH: 24.3 pg — ABNORMAL LOW (ref 26.0–34.0)
MCHC: 30.9 g/dL (ref 30.0–36.0)
MCV: 78.6 fL — ABNORMAL LOW (ref 80.0–100.0)
Platelets: 411 10*3/uL — ABNORMAL HIGH (ref 150–400)
RBC: 3.46 MIL/uL — ABNORMAL LOW (ref 4.22–5.81)
RDW: 17.1 % — ABNORMAL HIGH (ref 11.5–15.5)
WBC: 23.2 10*3/uL — ABNORMAL HIGH (ref 4.0–10.5)
nRBC: 0 % (ref 0.0–0.2)

## 2019-01-06 LAB — TROPONIN I
Troponin I: 0.07 ng/mL (ref <0.03)
Troponin I: 0.07 ng/mL (ref <0.03)
Troponin I: 0.06 ng/mL (ref <0.03)

## 2019-01-06 LAB — URINE CULTURE: Culture: NO GROWTH

## 2019-01-06 LAB — LACTIC ACID, PLASMA: LACTIC ACID, VENOUS: 5.1 mmol/L — AB (ref 0.5–1.9)

## 2019-01-06 SURGERY — CYSTOURETEROSCOPY, WITH RETROGRADE PYELOGRAM AND STENT INSERTION
Anesthesia: General | Site: Urethra | Laterality: Right

## 2019-01-06 MED ORDER — ROCURONIUM BROMIDE 50 MG/5ML IV SOLN
30.0000 mg | Freq: Once | INTRAVENOUS | Status: AC
  Administered 2019-01-06: 30 mg via INTRAVENOUS

## 2019-01-06 MED ORDER — ARFORMOTEROL TARTRATE 15 MCG/2ML IN NEBU
15.0000 ug | INHALATION_SOLUTION | Freq: Two times a day (BID) | RESPIRATORY_TRACT | Status: DC
  Administered 2019-01-06 – 2019-01-07 (×4): 15 ug via RESPIRATORY_TRACT
  Filled 2019-01-06 (×4): qty 2

## 2019-01-06 MED ORDER — FENTANYL CITRATE (PF) 100 MCG/2ML IJ SOLN
100.0000 ug | INTRAMUSCULAR | Status: DC | PRN
  Administered 2019-01-07 (×2): 100 ug via INTRAVENOUS
  Filled 2019-01-06 (×3): qty 2

## 2019-01-06 MED ORDER — MIDAZOLAM HCL 2 MG/2ML IJ SOLN
INTRAMUSCULAR | Status: AC
  Filled 2019-01-06: qty 4

## 2019-01-06 MED ORDER — VASOPRESSIN 20 UNIT/ML IV SOLN
0.0300 [IU]/min | INTRAVENOUS | Status: DC
  Administered 2019-01-06 – 2019-01-07 (×2): 0.03 [IU]/min via INTRAVENOUS
  Filled 2019-01-06 (×3): qty 2

## 2019-01-06 MED ORDER — ACETAMINOPHEN 10 MG/ML IV SOLN
1000.0000 mg | Freq: Four times a day (QID) | INTRAVENOUS | Status: DC | PRN
  Administered 2019-01-06: 1000 mg via INTRAVENOUS
  Filled 2019-01-06: qty 100

## 2019-01-06 MED ORDER — MIDAZOLAM HCL 2 MG/2ML IJ SOLN
2.0000 mg | INTRAMUSCULAR | Status: DC | PRN
  Filled 2019-01-06: qty 2

## 2019-01-06 MED ORDER — BUDESONIDE 0.5 MG/2ML IN SUSP
0.5000 mg | Freq: Two times a day (BID) | RESPIRATORY_TRACT | Status: DC
  Administered 2019-01-06 – 2019-01-07 (×4): 0.5 mg via RESPIRATORY_TRACT
  Filled 2019-01-06 (×4): qty 2

## 2019-01-06 MED ORDER — ONDANSETRON HCL 4 MG/2ML IJ SOLN
INTRAMUSCULAR | Status: DC | PRN
  Administered 2019-01-06: 4 mg via INTRAVENOUS

## 2019-01-06 MED ORDER — FENTANYL CITRATE (PF) 100 MCG/2ML IJ SOLN
INTRAMUSCULAR | Status: AC
  Filled 2019-01-06: qty 2

## 2019-01-06 MED ORDER — IPRATROPIUM BROMIDE 0.02 % IN SOLN
0.5000 mg | Freq: Four times a day (QID) | RESPIRATORY_TRACT | Status: DC
  Administered 2019-01-06 – 2019-01-07 (×4): 0.5 mg via RESPIRATORY_TRACT
  Filled 2019-01-06 (×4): qty 2.5

## 2019-01-06 MED ORDER — CHLORHEXIDINE GLUCONATE 0.12% ORAL RINSE (MEDLINE KIT): 15.0000 mL | Freq: Two times a day (BID) | OROMUCOSAL | Status: DC

## 2019-01-06 MED ORDER — MIDAZOLAM HCL 2 MG/2ML IJ SOLN
2.0000 mg | INTRAMUSCULAR | Status: DC | PRN
  Administered 2019-01-07: 2 mg via INTRAVENOUS
  Filled 2019-01-06 (×2): qty 2

## 2019-01-06 MED ORDER — ONDANSETRON HCL 4 MG/2ML IJ SOLN
INTRAMUSCULAR | Status: AC
  Filled 2019-01-06: qty 2

## 2019-01-06 MED ORDER — FAMOTIDINE IN NACL 20-0.9 MG/50ML-% IV SOLN
20.0000 mg | Freq: Every day | INTRAVENOUS | Status: DC
  Administered 2019-01-06: 20 mg via INTRAVENOUS
  Filled 2019-01-06: qty 50

## 2019-01-06 MED ORDER — LACTATED RINGERS IV SOLN: INTRAVENOUS | Status: DC | PRN

## 2019-01-06 MED ORDER — LORAZEPAM 2 MG/ML IJ SOLN
0.5000 mg | Freq: Four times a day (QID) | INTRAMUSCULAR | Status: DC | PRN
  Administered 2019-01-06: 0.5 mg via INTRAVENOUS
  Filled 2019-01-06: qty 1

## 2019-01-06 MED ORDER — FENTANYL CITRATE (PF) 100 MCG/2ML IJ SOLN
50.0000 ug | Freq: Once | INTRAMUSCULAR | Status: AC
  Administered 2019-01-06: 50 ug via INTRAVENOUS

## 2019-01-06 MED ORDER — CHLORHEXIDINE GLUCONATE 0.12% ORAL RINSE (MEDLINE KIT)
15.0000 mL | Freq: Two times a day (BID) | OROMUCOSAL | Status: DC
  Administered 2019-01-06 – 2019-01-07 (×4): 15 mL via OROMUCOSAL

## 2019-01-06 MED ORDER — PROPOFOL 10 MG/ML IV BOLUS
INTRAVENOUS | Status: AC
  Filled 2019-01-06: qty 20

## 2019-01-06 MED ORDER — ETOMIDATE 2 MG/ML IV SOLN
10.0000 mg | Freq: Once | INTRAVENOUS | Status: AC
  Administered 2019-01-06: 11:00:00 via INTRAVENOUS

## 2019-01-06 MED ORDER — SODIUM CHLORIDE 0.9 % IV SOLN
1.0000 g | Freq: Two times a day (BID) | INTRAVENOUS | Status: DC
  Administered 2019-01-06 – 2019-01-07 (×3): 1 g via INTRAVENOUS
  Filled 2019-01-06 (×6): qty 1

## 2019-01-06 MED ORDER — ROCURONIUM BROMIDE 10 MG/ML (PF) SYRINGE
PREFILLED_SYRINGE | INTRAVENOUS | Status: DC | PRN
  Administered 2019-01-06: 20 mg via INTRAVENOUS
  Administered 2019-01-06: 30 mg via INTRAVENOUS

## 2019-01-06 MED ORDER — LIDOCAINE 2% (20 MG/ML) 5 ML SYRINGE
INTRAMUSCULAR | Status: AC
  Filled 2019-01-06: qty 5

## 2019-01-06 MED ORDER — SODIUM CHLORIDE 0.9 % IR SOLN
Status: DC | PRN
  Administered 2019-01-06: 6000 mL via INTRAVESICAL

## 2019-01-06 MED ORDER — MIDAZOLAM HCL 2 MG/2ML IJ SOLN
2.0000 mg | Freq: Once | INTRAMUSCULAR | Status: AC
  Administered 2019-01-06: 2 mg via INTRAVENOUS

## 2019-01-06 MED ORDER — ORAL CARE MOUTH RINSE: 15.0000 mL | OROMUCOSAL | Status: DC

## 2019-01-06 MED ORDER — ALBUTEROL SULFATE (2.5 MG/3ML) 0.083% IN NEBU: 2.5000 mg | INHALATION_SOLUTION | RESPIRATORY_TRACT | Status: DC | PRN

## 2019-01-06 MED ORDER — DEXMEDETOMIDINE HCL IN NACL 200 MCG/50ML IV SOLN
0.0000 ug/kg/h | INTRAVENOUS | Status: DC
  Administered 2019-01-06: 0.6 ug/kg/h via INTRAVENOUS
  Administered 2019-01-06: 0.4 ug/kg/h via INTRAVENOUS
  Administered 2019-01-07 (×2): 1.2 ug/kg/h via INTRAVENOUS
  Administered 2019-01-07: 0.9 ug/kg/h via INTRAVENOUS
  Administered 2019-01-07: 1 ug/kg/h via INTRAVENOUS
  Filled 2019-01-06 (×6): qty 50

## 2019-01-06 MED ORDER — IOHEXOL 300 MG/ML  SOLN
INTRAMUSCULAR | Status: DC | PRN
  Administered 2019-01-06: 10 mL via URETHRAL

## 2019-01-06 MED ORDER — ORAL CARE MOUTH RINSE
15.0000 mL | OROMUCOSAL | Status: DC
  Administered 2019-01-06 – 2019-01-08 (×15): 15 mL via OROMUCOSAL

## 2019-01-06 MED ORDER — SODIUM CHLORIDE 0.9 % IV SOLN
INTRAVENOUS | Status: DC | PRN
  Administered 2019-01-06: 13:00:00 via INTRAVENOUS

## 2019-01-06 MED ORDER — FENTANYL CITRATE (PF) 100 MCG/2ML IJ SOLN
100.0000 ug | INTRAMUSCULAR | Status: DC | PRN
  Administered 2019-01-06 – 2019-01-07 (×2): 100 ug via INTRAVENOUS
  Filled 2019-01-06: qty 2

## 2019-01-06 MED ORDER — FENTANYL CITRATE (PF) 100 MCG/2ML IJ SOLN
INTRAMUSCULAR | Status: DC | PRN
  Administered 2019-01-06: 25 ug via INTRAVENOUS

## 2019-01-06 MED ORDER — ROCURONIUM BROMIDE 100 MG/10ML IV SOLN
INTRAVENOUS | Status: AC
  Filled 2019-01-06: qty 1

## 2019-01-06 MED ORDER — SUCCINYLCHOLINE CHLORIDE 200 MG/10ML IV SOSY: PREFILLED_SYRINGE | INTRAVENOUS | Status: DC | PRN

## 2019-01-06 SURGICAL SUPPLY — 26 items
BAG URO CATCHER STRL LF (MISCELLANEOUS) ×2 IMPLANT
BASKET LASER NITINOL 1.9FR (BASKET) IMPLANT
CATH INTERMIT  6FR 70CM (CATHETERS) ×2 IMPLANT
CLOTH BEACON ORANGE TIMEOUT ST (SAFETY) ×2 IMPLANT
COVER WAND RF STERILE (DRAPES) IMPLANT
EXTRACTOR STONE 1.7FRX115CM (UROLOGICAL SUPPLIES) IMPLANT
FIBER LASER FLEXIVA 1000 (UROLOGICAL SUPPLIES) IMPLANT
FIBER LASER FLEXIVA 365 (UROLOGICAL SUPPLIES) IMPLANT
FIBER LASER FLEXIVA 550 (UROLOGICAL SUPPLIES) IMPLANT
FIBER LASER TRAC TIP (UROLOGICAL SUPPLIES) IMPLANT
GLOVE BIOGEL M STRL SZ7.5 (GLOVE) ×2 IMPLANT
GOWN STRL REUS W/TWL LRG LVL3 (GOWN DISPOSABLE) ×4 IMPLANT
GUIDEWIRE ANG ZIPWIRE 038X150 (WIRE) ×2 IMPLANT
GUIDEWIRE STR DUAL SENSOR (WIRE) ×2 IMPLANT
IV NS 1000ML (IV SOLUTION) ×2
IV NS 1000ML BAXH (IV SOLUTION) ×1 IMPLANT
MANIFOLD NEPTUNE II (INSTRUMENTS) ×2 IMPLANT
PACK CYSTO (CUSTOM PROCEDURE TRAY) ×2 IMPLANT
SHEATH URETERAL 12FRX28CM (UROLOGICAL SUPPLIES) IMPLANT
SHEATH URETERAL 12FRX35CM (MISCELLANEOUS) IMPLANT
STENT CONTOUR 7FRX24 (STENTS) ×2 IMPLANT
SYR CONTROL 10ML LL (SYRINGE) ×2 IMPLANT
TRAY FOLEY MTR SLVR 16FR STAT (SET/KITS/TRAYS/PACK) ×2 IMPLANT
TUBE FEEDING 8FR 16IN STR KANG (MISCELLANEOUS) ×2 IMPLANT
TUBING CONNECTING 10 (TUBING) ×2 IMPLANT
TUBING UROLOGY SET (TUBING) ×2 IMPLANT

## 2019-01-06 NOTE — Anesthesia Postprocedure Evaluation (Signed)
Anesthesia Post Note  Patient: Psychologist, forensic  Procedure(s) Performed: CYSTOSCOPY WITH RIGHT RETROGRADE PYELOGRAM, RIGHT DIAGNOSTIC URETEROSCOPY AND STENT PLACEMENT (Right Urethra)     Patient location during evaluation: ICU Anesthesia Type: General Level of consciousness: sedated Pain management: pain level controlled Respiratory status: patient on ventilator - see flowsheet for VS and patient remains intubated per anesthesia plan Cardiovascular status: unstable Postop Assessment: no apparent nausea or vomiting Anesthetic complications: no Comments: Remains critically ill, ICU carefully following    Last Vitals:  Vitals:   01/28/2019 1900 01/13/2019 1956  BP: 90/60 91/62  Pulse: 83 82  Resp: 20 (!) 22  Temp:    SpO2: 96% 98%    Last Pain:  Vitals:   01/05/2019 1200  TempSrc: Axillary  PainSc:                  [name] [name],[name] [name]

## 2019-01-06 NOTE — Transfer of Care (Signed)
Immediate Anesthesia Transfer of Care Note  Patient: [name] [name]  Procedure(s) Performed: CYSTOSCOPY WITH RIGHT RETROGRADE PYELOGRAM, RIGHT DIAGNOSTIC URETEROSCOPY AND STENT PLACEMENT (Right Urethra)  Patient Location: ICU  Anesthesia Type:General  Level of Consciousness: Patient remains intubated per anesthesia plan  Airway & Oxygen Therapy: Patient remains intubated per anesthesia plan  Post-op Assessment: Report given to RN and Post -op Vital signs reviewed and stable  Post vital signs: Reviewed and stable  Last Vitals:  Vitals Value Taken Time  BP    Temp    Pulse    Resp    SpO2      Last Pain:  Vitals:   01/23/2019 1200  TempSrc: Axillary  PainSc:          Complications: No apparent anesthesia complications

## 2019-01-06 NOTE — Procedures (Signed)
PCCM Video Bronchoscopy Procedure Note  The patient was informed of the risks (including but not limited to bleeding, infection, respiratory failure, lung injury, tooth/oral injury) and benefits of the procedure and gave consent, see chart.  Indication: Acute respiratory failure with hypoxemia, cavitary pneumonia right lower lobe  Post Procedure Diagnosis: same  Location: [facility]  Condition pre procedure: stable  Medications for procedure: etomidate, versed, fentanyl, rocuronium  Procedure description: The bronchoscope was introduced through the endotracheal tube and passed to the bilateral lungs to the level of the subsegmental bronchi throughout the tracheobronchial tree.  The trachea was patent, the ETT was about 3 cm above the carina which was sharp.   Airway exam revealed bloody secretions in the right lung, but other wise no airway lesions or masses.   Procedures performed: BAL RLL anterior segment: 60cc sterile saline injected, 10cc bloody return  Specimens sent: BAL: Bacterial, fungus, AFB culture  Condition post procedure: critically ill, on vent  EBL: none from procedure  Complications: none from procedure  Roselie Awkward, MD [place] PCCM Pager: [phone] Cell: [phone] If no response, call [phone]

## 2019-01-06 NOTE — Progress Notes (Signed)
Initial Nutrition Assessment  DOCUMENTATION CODES:   Severe malnutrition in context of chronic illness, Underweight  INTERVENTION:  Pt is currently NPO, once diet changes:  Ensure Enlive po TID, each supplement provides 350 kcal and 20 grams of protein  RN notes that the pt has now been intubated, see following note for TF recommendations.    NUTRITION DIAGNOSIS:   Severe Malnutrition related to cancer and cancer related treatments, chronic illness(Multiple Myeloma, per chart suspected other carcinomas ) as evidenced by severe fat depletion, severe muscle depletion, percent weight loss.    GOAL:   Patient will meet greater than or equal to 90% of their needs   MONITOR:   Diet advancement, Weight trends, Labs  REASON FOR ASSESSMENT:   Malnutrition Screening Tool(MST 2)    ASSESSMENT:   Pt is 33y M with PMH of COPD (3L O2 at home), Acute on Chronic Respiratory failure, HTN, Multiple Myeloma ([place]), chronic left-sided hydropneumothorax, bronchiectasis, allergic bronchopulmonary aspergillosis, heterozygous alpha 1antitrypsindeficiency, recently admitted for sepsis.  Presented to ED with SOB. Pt is now admitted for Sepsis.   Pt is now on BiPAP. 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 RN states that they might have to intubate.   When visiting for assessment, pt was unable to answer questions. Pt would respond yes, but didn't seem to understand. No diet recall was able to be preformed. When asked about ensure, pt responded yes to drinking them, and yes when the number 3 was mention.   Per weight encounters in chart, pt was at 41.2kg on 12/15/2018, pt is currently at 35.8kg, this would indicate a 13.1% weight loss over a month, which is significant for timeframe.   With NFPE findings and %weight loss pt meets criteria for severe malnutrition in the context of chronic illness.   Labs reviewed:  Glucose 137 (H)   Medications reviewed and  include:  Hydrocortisone '100mg'$  Injection '50mg'$  0.9% Sodium Chloride Infusion 12m/hr    NUTRITION - FOCUSED PHYSICAL EXAM:    Most Recent Value  Orbital Region  Moderate depletion [Pt has BiPAP on]  Upper Arm Region  Severe depletion  Thoracic and Lumbar Region  Severe depletion  Buccal Region  Moderate depletion [Pt has BiPAP on]  Temple Region  Severe depletion  Clavicle Bone Region  Severe depletion  Clavicle and Acromion Bone Region  Severe depletion  Scapular Bone Region  Severe depletion  Dorsal Hand  Unable to assess [Pt has Mitts on]  Patellar Region  Severe depletion  Anterior Thigh Region  Severe depletion  Posterior Calf Region  Severe depletion  Edema (RD Assessment)  None  Hair  Reviewed  Eyes  Reviewed  Mouth  Unable to assess [BiPAP]  Skin  Reviewed [Dry]  Nails  Unable to assess [Mitts ]       Diet Order:   Diet Order            Diet NPO time specified  Diet effective now              EDUCATION NEEDS:   No education needs have been identified at this time  Skin:  Skin Assessment: Skin Integrity Issues: Skin Integrity Issues:: Stage II Stage II: Partial thickness loss of dermis presenting as a shallow open ulcer with a red, pink wound bed without slough. pea-sized  Last BM:  Unknown  Height:   Ht Readings from Last 1 Encounters:  01/12/2019 5' (1.524 m)    Weight:   Wt Readings from Last 1  Encounters:  01/27/2019 35.8 kg    Ideal Body Weight:  48.18 kg  BMI:  Body mass index is 15.41 kg/m.  Estimated Nutritional Needs:   Kcal:  1700-2000  Protein:  85-100 grams  Fluid:  >1.7L    Herma Carson, [place] Dietetic Intern

## 2019-01-06 NOTE — Progress Notes (Signed)
[facility]  Called back to bedside for ongoing shock, levophed need after coming out of OR after R pyelogram and R diagnostic ureteroscopy and stent placement.    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

## 2019-01-06 NOTE — Progress Notes (Signed)
Nutrition Follow-up  DOCUMENTATION CODES:   Severe malnutrition in context of chronic illness, Underweight  INTERVENTION:  - If patient to remain intubated >24 hours, recommend Vital AF 1.2 @ 35 ml/hr with 30 ml Prostat BID. - This regimen will provide 1208 kcal (101% estimated kcal need), 93 grams of protein, and 681 ml free water. - Recommend daily multivitamin d/t TF rate <40 ml/hr.  Monitor magnesium, potassium, and phosphorus daily for at least 3 days, MD to replete as needed, as pt is at risk for refeeding syndrome given severe malnutrition, likely inadequate oral intake PTA. Noted current hyperkalemia.    NUTRITION DIAGNOSIS:   Severe Malnutrition related to cancer and cancer related treatments, chronic illness(Multiple Myeloma, per chart suspected other carcinomas ) as evidenced by severe fat depletion, severe muscle depletion, percent weight loss.  GOAL:   Patient will meet greater than or equal to 90% of their needs  MONITOR:   Vent status, Weight trends, Labs, Skin, I & O's  REASON FOR ASSESSMENT:   Ventilator  ASSESSMENT:   Pt is 55y M with PMH of COPD (3L O2 at home), Acute on Chronic Respiratory failure, HTN, Multiple Myeloma (Revlimid), chronic left-sided hydropneumothorax, bronchiectasis, allergic bronchopulmonary aspergillosis, heterozygous alpha 1antitrypsindeficiency, recently admitted for sepsis.  Presented to ED with SOB. Pt is now admitted for Sepsis.   Patient seen and assessed by dietetic intern this AM. Patient was subsequently intubated and OGT placed at ~11:35 AM today. Estimated nutrition needs have been updated based on this. Family at bedside with in-person interpretor. TF recommendations outlined above for when appropriate to feed.    Patient is currently intubated on ventilator support MV: 7 L/min Temp (24hrs), Avg:97.9 F (36.6 C), Min:96.8 F (36 C), Max:98.6 F (37 C) Propofol: none BP: 73/39 and MAP: 48  Medications reviewed; 12.5 g  albumin x1 dose 3/3, 20 mg IV pepcid/day, 50 mg solu-cortef QID. Labs reviewed; K: 5.9 mmol/l, BUN: 28 mg/dl, creatinine: 1.49 mg/dl, Ca: 7 mg/dl, GFR: 52 ml/min. IVF; NS @ 125 ml/hr. Drips; levo @ 10 mcg/min, precedex @ 0.8 mcg/kg/hr.      Diet Order:   Diet Order            Diet NPO time specified  Diet effective now              EDUCATION NEEDS:   No education needs have been identified at this time  Skin:  Skin Assessment: Skin Integrity Issues: Skin Integrity Issues:: Stage II Stage II: Partial thickness loss of dermis presenting as a shallow open ulcer with a red, pink wound bed without slough. pea-sized  Last BM:  Unknown  Height:   Ht Readings from Last 1 Encounters:  01/29/2019 5' (1.524 m)    Weight:   Wt Readings from Last 1 Encounters:  01/19/2019 35.8 kg    Ideal Body Weight:  48.18 kg  BMI:  Body mass index is 15.41 kg/m.  Estimated Nutritional Needs:   Kcal:  1197 kcal  Protein:  80-90 grams  Fluid:  >1.7L     [name] Matin, MS, RD, LDN, CNSC Inpatient Clinical Dietitian Pager # [phone] After hours/weekend pager # [phone]

## 2019-01-06 NOTE — Progress Notes (Signed)
Pharmacy Antibiotic Note  [name] [name] is a 58 y.o. male admitted on 01/20/2019 with Urosepsis and pneumonia.  Pharmacy has been consulted for Cefepime and vancomycin dosing.   WBC elevated and increased to 23.2 SCr only slightly improved to 1.49 Lactic acid increased to 5.1 Afebrile  Plan:  Metronidazole per MD.  Increase to Cefepime 1g IV q12h  Continue Vancomycin 500 mg IV q36 hr (expected AUC 465, Cmax ss 30.6 Cmin ss 11.5 using Scr of 1.49)  Check vancomycin levels if remains on vancomycin > 3-4 days.  Goal AUC 400-550.  Follow up renal fxn, culture results, and clinical course.   Height: 5' (152.4 cm) Weight: 78 lb 14.8 oz (35.8 kg) IBW/kg (Calculated) : 50  Temp (24hrs), Avg:97.9 F (36.6 C), Min:96.8 F (36 C), Max:98.6 F (37 C)  Recent Labs  Lab 01/31/2019 1124 02/02/2019 1314 01/19/2019 2211 01/13/2019 0816 01/10/2019 0940  WBC 16.2*  --   --   --  23.2*  CREATININE 1.56*  --   --  1.49*  --   LATICACIDVEN 2.3* 3.1* 3.2* 5.1*  --     Estimated Creatinine Clearance: 28 mL/min (A) (by C-G formula based on SCr of 1.49 mg/dL (H)).    No Known Allergies  Antimicrobials this admission:  3/3 Cefepime >>  3/3 Metronidazole >> 3/3 Vancomycin >>   Dose adjustments this admission:   Current Admission, Microbiology results:  3/3 BCx: ngtd 3/3 UCx: NGF  3/3 MRSA PCR: negative 3/4 BAL:   Past Micro: 12/10/18 Tracheal aspirate: rare MSSA 12/06/18 BCx: MSSA  Thank you for allowing pharmacy to be a part of this patient's care.  Gretta Arab PharmD, BCPS Pager [phone] 01/12/2019 12:59 PM

## 2019-01-06 NOTE — Anesthesia Preprocedure Evaluation (Addendum)
Anesthesia Evaluation  Patient identified by MRN, date of birth, ID band Patient unresponsive    Reviewed: Allergy & Precautions, NPO status , Patient's Chart, lab work & pertinent test results, Unable to perform ROS - Chart review only  History of Anesthesia Complications Negative for: history of anesthetic complications  Airway Mallampati: Intubated       Dental   Pulmonary COPD,  COPD inhaler and oxygen dependent,  chronic respiratory failure on 3L home O2, and chronic L hydropneumothorax s/p VATS decortication of empyema in 2016 Now intubated with acute decompensation: Acute respiratory failure with hypoxemia, cavitary pneumonia right lower lobe   + rhonchi    + intubated    Cardiovascular hypertension, (-) angina Rhythm:Regular Rate:Tachycardia  12/2018 ECHO: EF 60-65%, mild MR, mod TR   Neuro/Psych Sedated Multiple myeloma    GI/Hepatic negative GI ROS, (+) Hepatitis -, C  Endo/Other  negative endocrine ROS  Renal/GU Renal InsufficiencyRenal disease (creat 1.49 with uteral obstruction)K+ 5.9     Musculoskeletal   Abdominal   Peds  Hematology  (+) Blood dyscrasia (Hb 8.4), anemia ,   Anesthesia Other Findings   Reproductive/Obstetrics                            Anesthesia Physical Anesthesia Plan  ASA: IV  Anesthesia Plan: General   Post-op Pain Management:    Induction: Inhalational  PONV Risk Score and Plan: 2  Airway Management Planned: Oral ETT  Additional Equipment:   Intra-op Plan:   Post-operative Plan: Post-operative intubation/ventilation  Informed Consent: I have reviewed the patients History and Physical, chart, labs and discussed the procedure including the risks, benefits and alternatives for the proposed anesthesia with the patient or authorized representative who has indicated his/her understanding and acceptance.     Only emergency history available and  History available from chart only  Plan Discussed with: CRNA and Surgeon  Anesthesia Plan Comments:        Anesthesia Quick Evaluation

## 2019-01-06 NOTE — Procedures (Signed)
Intubation Procedure Note [name] [name] [id number] 01/09/1961  Procedure: Intubation Indications: Respiratory insufficiency  Procedure Details Consent: Risks of procedure as well as the alternatives and risks of each were explained to the (patient/caregiver).  Consent for procedure obtained. Time Out: Verified patient identification, verified procedure, site/side was marked, verified correct patient position, special equipment/implants available, medications/allergies/relevent history reviewed, required imaging and test results available.  Performed  Drugs Etomidate 10mg  IV, Versed 2mg  IV, Fentanyl 55mcg IV, rocuronium 30mg  IV DL x 1 with GS 3 blade Grade 1 view 7.5 ET tube passed through cords under direct visualization Placement confirmed with bilateral breath sounds, positive EtCO2 change and smoke in tube   Evaluation Hemodynamic Status: BP stable throughout; O2 sats: stable throughout Patient's Current Condition: stable Complications: No apparent complications Patient did tolerate procedure well. Chest X-ray ordered to verify placement.  CXR: pending.   [name] [name] 01/20/2019

## 2019-01-06 NOTE — Brief Op Note (Signed)
01/21/2019  1:27 PM  PATIENT:  [name] [name]  58 y.o. 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  PROCEDURE:  Procedure(s): CYSTOSCOPY WITH RIGHT RETROGRADE PYELOGRAM, RIGHT DIAGNOSTIC URETEROSCOPY AND STENT PLACEMENT (Right)  SURGEON:  Surgeon(s) and Role:    * Alexis Frock, MD - Primary  PHYSICIAN ASSISTANT:   ASSISTANTS: none   ANESTHESIA:   general  EBL:  44mL   BLOOD ADMINISTERED:none  DRAINS: 1F foley to gravity   LOCAL MEDICATIONS USED:  NONE  SPECIMEN:  No Specimen  DISPOSITION OF SPECIMEN:  N/A  COUNTS:  YES  TOURNIQUET:  * No tourniquets in log *  DICTATION: .Other Dictation: Dictation Number [id number]  PLAN OF CARE: Admit to inpatient   PATIENT DISPOSITION:  ICU - intubated and critically ill.   Delay start of Pharmacological VTE agent (>24hrs) due to surgical blood loss or risk of bleeding: yes

## 2019-01-06 NOTE — Progress Notes (Addendum)
NAMELashawn [name], MRN:  [id number], DOB:  07-23-1961, LOS: 1 ADMISSION DATE:  02/02/2019, CONSULTATION DATE:  3/3 REFERRING MD:  Dr. Lorin Mercy TR[facility], CHIEF COMPLAINT:  Sepsis   Brief History   58 year old male admitted for severe sepsis secondary to pyelonephritis and possibly renal abscess/hemorrhage.   History of present illness   58 year old male with PMH as below, which is significant for A1A deficient emphysema, multiple myeloma on Revlimid, chronic respiratory failure on 3L home O2, and chronic L hydropneumothorax s/p VATS decortication of empyema in 2016. He was admitted in early February for PNA and developed MSSA bacteremia. No vegetation seen on TTE. He was treated with 2 weeks of antibiotics. Then 2/21 he presented with R flank pain and on imaging was found to have R hydronephrosis and was discharged with plans for urology follow up. It would seem as though this had not yet occurred when on 3/3 early AM he had worsening flank pain and noted hematuria. He presented to [facility] ED with these complaints and underwent CT which demonstrated significant hydronephrosis and hydroureter with additional concern for ureteral cancer. He then became hypotensive despite IVF resuscitation and PCCM was asked to see in consultation.   Past Medical History   has a past medical history of Acute on chronic respiratory failure ([place]) (02/07/2018), Hypertension, Kidney stones, Multiple myeloma ([place]), Obstructive lung disease (generalized) ([place]), and Pulmonary hypertension ([place]).  Significant Hospital Events   3/03  Admit to [facility] with concern for pyelonephritis, possible renal abscess  Consults:  Urology 3/3  Procedures:    Significant Diagnostic Tests:  CT abd pelvis 3/3 > Interval diffuse enlargement of the right kidney with interval areas of increased density in the right renal collecting system and right ureter, compatible with hemorrhage, causing mild to moderate right hydronephrosis and moderate right  hydroureter. Interval blood in the dependent portion of the urinary bladder. Mild small bowel ileus. Partial obstruction is less likely.  Micro Data:  Blood 3/3 >> Urine 3/3 >>  Antimicrobials:  Cefepime 3/3 > Vancomycin 3/3 > Flagyl 3/3 >  Interim history/subjective:  RN reports pt with increased work of breathing, anxious, requiring 6L O2.  Levophed weaned off. Pt reports chest & low back discomfort.   Objective   Blood pressure 115/75, pulse 97, temperature 97.6 F (36.4 C), temperature source Oral, resp. rate (!) 30, height 5' (1.524 m), weight 35.8 kg, SpO2 96 %.        Intake/Output Summary (Last 24 hours) at 01/04/2019 0815 Last data filed at 01/21/2019 0600 Gross per 24 hour  Intake 1296.76 ml  Output 700 ml  Net 596.76 ml   Filed Weights   01/19/2019 0941 02/02/2019 2030  Weight: 36.7 kg 35.8 kg    Examination: General: cachectic male sitting up in bed with increased work of breathing  HEENT: MM pink/dry, mild diaphoresis, JVP+ Neuro: Awake, alert, oriented, appropriate  CV: s1s2 rrr, tachy, no m/r/g PULM: accessory muscle use, pt sitting up leaning forward, diffuse coarse crackles / rhonchi LH:TDSK, non-tender, bsx4 active  Extremities: warm/dry, no edema  Skin: no rashes or lesions  Resolved Hospital Problem list     Assessment & Plan:   Septic shock secondary to R pyelonephritis vs renal abscess P: Levophed for MAP goal > 65  Continue abx as above Follow PCT, lactate Appreciate Urology assistance   R renal hemorrhage concerning for malignancy P: Follow Hgb/Hct  Urology evaluation > 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, will need endoscopic  evaluation   Chronic hypoxemic respiratory failure  -secondary to emphysema (A1A deficient) P: Stop Breo  Brovana + Pulmicort + Atrovent in place of home medications Wean O2 for sats 88-95% Now BiPAP for increased work of breathing  Ativan 0.51m IV x1 NPO for now   Chronic HFpEF P: KVO  IVF  Anemia P: Trend CBC  Transfuse per ICU guidelines  AKI -in setting of pre-renal + obstructive components  P: Trend BMP / urinary output Replace electrolytes as indicated Avoid nephrotoxic agents, ensure adequate renal perfusion Await am labs 3/4  Multiple myeloma P: Follow up as outpatient   Best practice:  Diet: NPO Pain/Anxiety/Delirium protocol (if indicated): n/a VAP protocol (if indicated): n/a DVT prophylaxis: SCDs GI prophylaxis: n/a Glucose control: n/a Mobility: BR Code Status: FULL Family Communication: Niece H-Neo SKorinekis contact person for him. Patient updated 3/4 at bedside.  Disposition: ICU  Labs   CBC: Recent Labs  Lab 01/15/2019 1124  WBC 16.2*  NEUTROABS 14.2*  HGB 9.5*  HCT 29.9*  MCV 75.5*  PLT 540*    Basic Metabolic Panel: Recent Labs  Lab 01/21/2019 1124  NA 131*  K 5.2*  CL 90*  CO2 37*  GLUCOSE 70  BUN 28*  CREATININE 1.56*  CALCIUM 8.2*   GFR: Estimated Creatinine Clearance: 26.8 mL/min (A) (by C-G formula based on SCr of 1.56 mg/dL (H)). Recent Labs  Lab 01/29/2019 1124 01/24/2019 1314 01/04/2019 2211  WBC 16.2*  --   --   LATICACIDVEN 2.3* 3.1* 3.2*    Liver Function Tests: Recent Labs  Lab 01/31/2019 1124  AST 18  ALT 10  ALKPHOS 81  BILITOT 0.7  PROT 6.4*  ALBUMIN 2.0*   Recent Labs  Lab 01/15/2019 1124  LIPASE 28   No results for input(s): AMMONIA in the last 168 hours.  ABG    Component Value Date/Time   PHART 7.432 12/12/2018 0345   PCO2ART 44.1 12/12/2018 0345   PO2ART 88.7 12/12/2018 0345   HCO3 29.2 (H) 12/12/2018 0345   TCO2 32 06/24/2016 0902   ACIDBASEDEF 0.4 12/10/2018 0335   O2SAT 96.4 12/12/2018 0345     Coagulation Profile: Recent Labs  Lab 01/29/2019 1124  INR 1.1    Cardiac Enzymes: Recent Labs  Lab 01/09/2019 1124  TROPONINI 0.07*    HbA1C: No results found for: HGBA1C  CBG: No results for input(s): GLUCAP in the last 168 hours.   Critical care time: 30 minutes      BNoe Gens NP-C [facility] Pgr: [phone] or if no answer [phone] 23033/02/2019, 8:15 AM

## 2019-01-06 NOTE — Progress Notes (Signed)
Day of Surgery   Subjective/Chief Complaint:   1- Gross Hematuria / Right Ureteral Mass - new gross hematuria 01/2018. INR normal. CT with right hydro / blood products in renal pelvis. CT 12/2018 with right upper ureter solid enhancing mass, new since imaging 2018. No endoscopic eval yet.  2 - Hydronephrosis / Acute Renal Failure - Right moderate hydro and Cr 1.6 (baseline <1.3) by ER eval 01/2019.  3 - Sepsis of Urinary Origin - recent h/o suspect MSSA pyelo with sepsis x several 2020. Bacteruria, elevated lactate by ER eval 01/2019 concerning for another episode. Placed on empiric maxipeme pending further CX data. [place], [place] 3/3 pending.  Today "Uvaldo" is seen to proceed with cysto / possible ureteroscopy / Rt ureteral stent placement. He had had pulmonary decline and now intubated.   Objective: Vital signs in last 24 hours: Temp:  [97.6 F (36.4 C)-98.6 F (37 C)] 97.6 F (36.4 C) (03/04 0342) Pulse Rate:  [82-134] 126 (03/04 1030) Resp:  [10-38] 16 (03/04 1100) BP: (57-159)/(20-105) 57/36 (03/04 1100) SpO2:  [88 %-100 %] 88 % (03/04 1030) FiO2 (%):  [45 %-100 %] 100 % (03/04 1106) Weight:  [35.8 kg] 35.8 kg (03/03 2030)    Intake/Output from previous day: 03/03 0701 - 03/04 0700 In: 1296.8 [I.V.:1296.8] Out: 700 [Urine:700] Intake/Output this shift: Total I/O In: 38.3 [I.V.:38.3] Out: -   Physical Exam  Constitutional: Intubated GCS 3T. Niece and friend at bedside.  Very thin, appears older than stated age. HENT:  Head: Normocephalic.  Eyes: Pupils are equal, round, and reactive to light.  Neck: Normal range of motion.  Cardiovascular:  Regular tachycardia low 100s. Respiratory: coarse on vent GI: Soft.  Genitourinary:    Penis normal.     Genitourinary Comments: Mild Rt CVAT   Musculoskeletal: Normal range of motion.  Neurological: He is alert and oriented to person, place, and time.  Skin: Skin is warm.  Psychiatric: He has a normal mood and affect.   Lab  Results:  Recent Labs    01/04/2019 1124 01/31/2019 0940  WBC 16.2* 23.2*  HGB 9.5* 8.4*  HCT 29.9* 27.2*  PLT 540* 411*   BMET Recent Labs    01/30/2019 1124 01/29/2019 0816  NA 131* 135  K 5.2* 5.9*  CL 90* 102  CO2 37* 19*  GLUCOSE 70 137*  BUN 28* 28*  CREATININE 1.56* 1.49*  CALCIUM 8.2* 7.0*   PT/INR Recent Labs    01/03/2019 1124  LABPROT 14.1  INR 1.1   ABG No results for input(s): PHART, HCO3 in the last 72 hours.  Invalid input(s): PCO2, PO2  Studies/Results: Dg Chest Port 1 View  Result Date: 01/03/2019 CLINICAL DATA:  Weakness and shortness of Breath EXAM: PORTABLE CHEST 1 VIEW COMPARISON:  12/25/2018 FINDINGS: Cardiac shadow is stable. Patchy infiltrate is again noted in the bases bilaterally left greater than right. Persistent left effusion is seen with some air in the pleural space stable in appearance from the prior exam. No acute bony abnormality is seen. IMPRESSION: Stable bibasilar changes left greater than right. Electronically Signed   By: Inez Catalina M.D.   On: 01/11/2019 12:02   Ct Renal Stone Study  Result Date: 01/24/2019 CLINICAL DATA:  Diffuse weakness and shortness of breath since 9 a.m. this morning. Hematuria. Multiple myeloma. EXAM: CT ABDOMEN AND PELVIS WITHOUT CONTRAST TECHNIQUE: Multidetector CT imaging of the abdomen and pelvis was performed following the standard protocol without IV contrast. COMPARISON:  12/25/2018. 01/23/2018. PET-CT dated 10/21/2017. FINDINGS:  Lower chest: The previously demonstrated cavitation in the right lower lobe is no longer seen with increased irregular linear consolidation in that region, measuring 6.2 x 1.5 cm on image number 5 series 4. There is less air and more fluid in the loculated fluid and air collection in the left pleural space with irregular areas of linear density within the fluid as well as irregular, coarse calcifications. There are also calcified pleural plaques at the right lung base. Hepatobiliary: No  focal liver abnormality is seen. No gallstones, gallbladder wall thickening, or biliary dilatation. Pancreas: Unremarkable. No pancreatic ductal dilatation or surrounding inflammatory changes. Spleen: Stable peripheral calcifications, most likely due to previous trauma. Normal size and shape of the spleen. Adrenals/Urinary Tract: Interval diffuse enlargement of the right kidney with interval areas of mildly increased density in the renal collecting system and pelvis and extending into the proximal right ureter. The right renal collecting system is mildly to moderately dilated and the proximal ureter is moderately dilated. There is mild to moderate dilatation of the right ureter to the level of the ureterovesical junction, containing the mildly increased density material. There is similar interval material in the dependent portion of the urinary bladder, greater on the right, with a small amount of linear calcific density. Again demonstrated are bilateral renal cysts. Normal appearing adrenal glands and left ureter. Stomach/Bowel: Multiple mildly dilated loops of small bowel. Gas, stool and high density contrast in normal caliber colon. High density contrast in a normal appearing appendix. Unremarkable stomach. Vascular/Lymphatic: Mild atheromatous arterial calcifications without aneurysm. No enlarged lymph nodes. Reproductive: Mildly enlarged prostate gland. Other: No abdominal wall hernia or abnormality. No abdominopelvic ascites. Musculoskeletal: Stable mild lumbar and lower thoracic spine degenerative changes, including facet degenerative changes with associated stable grade 1 anterolisthesis at the L4-5 level. There is a vacuum phenomenon within that disc. Stable previously demonstrated lytic lesion in the L4 vertebral body. IMPRESSION: 1. Interval diffuse enlargement of the right kidney with interval areas of increased density in the right renal collecting system and right ureter, compatible with hemorrhage,  causing mild to moderate right hydronephrosis and moderate right hydroureter. The enlargement of the kidney is compatible with a combination of hydronephrosis and previously demonstrated pyelonephritis and the hemorrhage is compatible with hemorrhage associated with pyelonephritis. 2. Interval blood in the dependent portion of the urinary bladder. 3. Mild small bowel ileus. Partial obstruction is less likely. 4. Interval decrease in size of the previously demonstrated cavitation in the right lower lobe with increased irregular linear consolidation in that region compatible with developing scar tissue. 5. Decreased air and more fluid in the loculated left pleural fluid and air collection. 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 6. 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 7. Stable lytic lesion in the L4 vertebral body compatible with the patient's known multiple myeloma. Electronically Signed   By: Claudie Revering M.D.   On: 01/10/2019 16:10    Anti-infectives: Anti-infectives (From admission, onward)   Start     Dose/Rate Route Frequency Ordered Stop   01/07/19 1000  vancomycin (VANCOCIN) 500 mg in sodium chloride 0.9 % 100 mL IVPB     500 mg 100 mL/hr over 60 Minutes Intravenous Every 36 hours 02/01/2019 1740     01/14/2019 1500  ceFEPIme (MAXIPIME) 1 g in sodium chloride 0.9 % 100 mL IVPB     1 g 200 mL/hr over 30 Minutes Intravenous Every 24 hours 01/28/2019 1516     01/25/2019 1700  metroNIDAZOLE (FLAGYL)  IVPB 500 mg     500 mg 100 mL/hr over 60 Minutes Intravenous Every 8 hours 01/21/2019 1657     01/26/2019 1700  vancomycin (VANCOCIN) IVPB 1000 mg/200 mL premix     1,000 mg 200 mL/hr over 60 Minutes Intravenous  Once 01/15/2019 1657 01/26/2019 2309   01/27/2019 1415  ceFEPIme (MAXIPIME) 2 g in sodium chloride 0.9 % 100 mL IVPB     2 g 200 mL/hr over 30 Minutes Intravenous  Once 01/28/2019 1408 01/09/2019 1755      Assessment/Plan:  Discussed pt at length with  crit care and nieces who are his primary next of kin. They unerstand his impressive baseline comorbidity but want to proceed with right renal decompression with goal of improving infectious parameters and helping elucidate cause of obstruction. Risks, benefits, alternatives discussed.   Alexis Frock 01/03/2019

## 2019-01-06 NOTE — Progress Notes (Signed)
eLink Physician-Brief Progress Note Patient Name: [name] [name] DOB: 1961/08/18 MRN: [id number]   Date of Service  01/09/2019  HPI/Events of Note  Back and flank pain  eICU Interventions  Ofirmev 1000 mg iv Q 6 hours prn pain x 24 hours     Intervention Category Intermediate Interventions: Pain - evaluation and management  Frederik Pear 01/29/2019, 12:49 AM

## 2019-01-06 NOTE — Procedures (Signed)
Central Venous Catheter Insertion Procedure Note [name] [name] [id number] 02/18/1961  Procedure: Insertion of Central Venous Catheter Indications: Assessment of intravascular volume and Drug and/or fluid administration  Procedure Details Consent: Risks of procedure as well as the alternatives and risks of each were explained to the (patient/caregiver).  Consent for procedure obtained. Time Out: Verified patient identification, verified procedure, site/side was marked, verified correct patient position, special equipment/implants available, medications/allergies/relevent history reviewed, required imaging and test results available.  Performed  Maximum sterile technique was used including antiseptics, cap, gloves, gown, hand hygiene, mask and sheet. Skin prep: Chlorhexidine; local anesthetic administered A antimicrobial bonded/coated triple lumen catheter was placed in the right internal jugular vein using the Seldinger technique.  Ultrasound was used to verify the patency of the vein and for real time needle guidance.  Evaluation Blood flow good Complications: No apparent complications Patient did tolerate procedure well. Chest X-ray ordered to verify placement.  CXR: pending.  [name] [name] 01/14/2019, 6:11 PM

## 2019-01-07 ENCOUNTER — Inpatient Hospital Stay (HOSPITAL_COMMUNITY): Payer: Medicare Other

## 2019-01-07 ENCOUNTER — Encounter (HOSPITAL_COMMUNITY): Payer: Self-pay | Admitting: Urology

## 2019-01-07 DIAGNOSIS — J9621 Acute and chronic respiratory failure with hypoxia: Secondary | ICD-10-CM

## 2019-01-07 LAB — BASIC METABOLIC PANEL
Anion gap: 7 (ref 5–15)
Anion gap: 6 (ref 5–15)
Anion gap: 7 (ref 5–15)
BUN: 37 mg/dL — ABNORMAL HIGH (ref 6–20)
BUN: 40 mg/dL — ABNORMAL HIGH (ref 6–20)
BUN: 45 mg/dL — ABNORMAL HIGH (ref 6–20)
CHLORIDE: 113 mmol/L — AB (ref 98–111)
CHLORIDE: 115 mmol/L — AB (ref 98–111)
CO2: 19 mmol/L — AB (ref 22–32)
CO2: 18 mmol/L — ABNORMAL LOW (ref 22–32)
CO2: 18 mmol/L — ABNORMAL LOW (ref 22–32)
Calcium: 5.9 mg/dL — CL (ref 8.9–10.3)
Calcium: 5.8 mg/dL — CL (ref 8.9–10.3)
Calcium: 5.7 mg/dL — CL (ref 8.9–10.3)
Chloride: 117 mmol/L — ABNORMAL HIGH (ref 98–111)
Creatinine, Ser: 1.41 mg/dL — ABNORMAL HIGH (ref 0.61–1.24)
Creatinine, Ser: 1.42 mg/dL — ABNORMAL HIGH (ref 0.61–1.24)
Creatinine, Ser: 1.55 mg/dL — ABNORMAL HIGH (ref 0.61–1.24)
GFR calc Af Amer: >60 mL/min (ref >60)
GFR calc Af Amer: >60 mL/min (ref >60)
GFR calc Af Amer: 57 mL/min — ABNORMAL LOW (ref >60)
GFR calc non Af Amer: 55 mL/min — ABNORMAL LOW (ref >60)
GFR calc non Af Amer: 55 mL/min — ABNORMAL LOW (ref >60)
GFR calc non Af Amer: 49 mL/min — ABNORMAL LOW (ref >60)
Glucose, Bld: 202 mg/dL — ABNORMAL HIGH (ref 70–99)
Glucose, Bld: 215 mg/dL — ABNORMAL HIGH (ref 70–99)
Glucose, Bld: 225 mg/dL — ABNORMAL HIGH (ref 70–99)
Potassium: 5.5 mmol/L — ABNORMAL HIGH (ref 3.5–5.1)
Potassium: 5.3 mmol/L — ABNORMAL HIGH (ref 3.5–5.1)
Potassium: 5.4 mmol/L — ABNORMAL HIGH (ref 3.5–5.1)
SODIUM: 142 mmol/L (ref 135–145)
Sodium: 139 mmol/L (ref 135–145)
Sodium: 139 mmol/L (ref 135–145)

## 2019-01-07 LAB — FIBRINOGEN: Fibrinogen: 504 mg/dL — ABNORMAL HIGH (ref 210–475)

## 2019-01-07 LAB — CBC
HCT: 20.6 % — ABNORMAL LOW (ref 39.0–52.0)
Hemoglobin: 6.5 g/dL — CL (ref 13.0–17.0)
MCH: 24.8 pg — ABNORMAL LOW (ref 26.0–34.0)
MCHC: 31.6 g/dL (ref 30.0–36.0)
MCV: 78.6 fL — ABNORMAL LOW (ref 80.0–100.0)
Platelets: 346 10*3/uL (ref 150–400)
RBC: 2.62 MIL/uL — ABNORMAL LOW (ref 4.22–5.81)
RDW: 17.3 % — ABNORMAL HIGH (ref 11.5–15.5)
WBC: 27.7 10*3/uL — ABNORMAL HIGH (ref 4.0–10.5)
nRBC: 0 % (ref 0.0–0.2)

## 2019-01-07 LAB — APTT: aPTT: 36 seconds (ref 24–36)

## 2019-01-07 LAB — ACID FAST SMEAR (AFB, MYCOBACTERIA): Acid Fast Smear: NEGATIVE

## 2019-01-07 LAB — ABO/RH: ABO/RH(D): AB POS

## 2019-01-07 LAB — PROTIME-INR
INR: 1.9 — ABNORMAL HIGH (ref 0.8–1.2)
Prothrombin Time: 21.6 seconds — ABNORMAL HIGH (ref 11.4–15.2)

## 2019-01-07 LAB — PREPARE RBC (CROSSMATCH)

## 2019-01-07 MED ORDER — PHENYLEPHRINE HCL-NACL 10-0.9 MG/250ML-% IV SOLN
0.0000 ug/min | INTRAVENOUS | Status: DC
  Administered 2019-01-08 (×2): 200 ug/min via INTRAVENOUS
  Filled 2019-01-07 (×2): qty 250

## 2019-01-07 MED ORDER — PANTOPRAZOLE SODIUM 40 MG IV SOLR
40.0000 mg | Freq: Every day | INTRAVENOUS | Status: DC
  Administered 2019-01-07: 40 mg via INTRAVENOUS
  Filled 2019-01-07: qty 40

## 2019-01-07 MED ORDER — NOREPINEPHRINE BITARTRATE 1 MG/ML IV SOLN
0.0000 ug/min | INTRAVENOUS | Status: DC
  Administered 2019-01-07: 6 ug/min via INTRAVENOUS
  Filled 2019-01-07 (×2): qty 4

## 2019-01-07 MED ORDER — SODIUM CHLORIDE 0.9 % IV SOLN
1.0000 g | Freq: Two times a day (BID) | INTRAVENOUS | Status: DC
  Administered 2019-01-07: 1 g via INTRAVENOUS
  Filled 2019-01-07 (×3): qty 1

## 2019-01-07 MED ORDER — FENTANYL 2500MCG IN NS 250ML (10MCG/ML) PREMIX INFUSION
0.0000 ug/h | INTRAVENOUS | Status: DC
  Administered 2019-01-07: 25 ug/h via INTRAVENOUS
  Administered 2019-01-07: 100 ug/h via INTRAVENOUS
  Filled 2019-01-07: qty 250

## 2019-01-07 MED ORDER — SODIUM POLYSTYRENE SULFONATE 15 GM/60ML PO SUSP
15.0000 g | Freq: Once | ORAL | Status: AC
  Administered 2019-01-07: 15 g
  Filled 2019-01-07: qty 60

## 2019-01-07 MED ORDER — METRONIDAZOLE IN NACL 5-0.79 MG/ML-% IV SOLN
500.0000 mg | Freq: Four times a day (QID) | INTRAVENOUS | Status: DC
  Administered 2019-01-07 – 2019-01-08 (×3): 500 mg via INTRAVENOUS
  Filled 2019-01-07 (×3): qty 100

## 2019-01-07 MED ORDER — ALBUMIN HUMAN 25 % IV SOLN: 25.0000 g | Freq: Once | INTRAVENOUS | Status: DC

## 2019-01-07 MED ORDER — NOREPINEPHRINE BITARTRATE 1 MG/ML IV SOLN
2.0000 ug/min | INTRAVENOUS | Status: DC
  Administered 2019-01-07: 5 ug/min via INTRAVENOUS
  Filled 2019-01-07: qty 4

## 2019-01-07 MED ORDER — IPRATROPIUM-ALBUTEROL 0.5-2.5 (3) MG/3ML IN SOLN
3.0000 mL | Freq: Four times a day (QID) | RESPIRATORY_TRACT | Status: DC
  Administered 2019-01-07 – 2019-01-08 (×3): 3 mL via RESPIRATORY_TRACT
  Filled 2019-01-07 (×3): qty 3

## 2019-01-07 MED ORDER — NOREPINEPHRINE BITARTRATE 1 MG/ML IV SOLN
0.0000 ug/min | INTRAVENOUS | Status: DC
  Administered 2019-01-07 – 2019-01-08 (×3): 40 ug/min via INTRAVENOUS
  Filled 2019-01-07 (×6): qty 4

## 2019-01-07 MED ORDER — PHENYLEPHRINE HCL-NACL 10-0.9 MG/250ML-% IV SOLN
INTRAVENOUS | Status: AC
  Administered 2019-01-07: 10 mg
  Filled 2019-01-07: qty 250

## 2019-01-07 MED ORDER — SODIUM CHLORIDE 0.9% IV SOLUTION: Freq: Once | INTRAVENOUS | Status: DC

## 2019-01-07 MED ORDER — ALBUMIN HUMAN 5 % IV SOLN
25.0000 g | Freq: Once | INTRAVENOUS | Status: AC
  Administered 2019-01-07: 12.5 g via INTRAVENOUS
  Filled 2019-01-07: qty 500

## 2019-01-07 NOTE — Progress Notes (Signed)
CRITICAL VALUE ALERT  Critical Value: HgB 6.5  Date & Time Notied:  01/07/2019 0430  Provider Notified: Warren Lacy  Orders Received/Actions taken: Awaiting orders  Mariann Laster RN

## 2019-01-07 NOTE — Progress Notes (Signed)
eLink Physician-Brief Progress Note Patient Name: [name] [name] DOB: 04-Mar-1961 MRN: [id number]   Date of Service  01/07/2019  HPI/Events of Note  Acute blood loss anemia from bleeding from the GU tract  eICU Interventions  Check PT, INR, Fibrinogen, type and cross, transfuse 2 units PRBC targeting a hemoglobin of 8.5 gm        [name] 01/07/2019, 5:43 AM

## 2019-01-07 NOTE — Progress Notes (Signed)
eLink Physician-Brief Progress Note Patient Name: [name] [name] DOB: January 19, 1961 MRN: [id number]   Date of Service  01/07/2019  HPI/Events of Note  Difficulty with reliably tracking non-invasive blood pressure  eICU Interventions  order placed to insert arterial line for BP monitoring and lab draws        Frederik Pear 01/07/2019, 11:24 PM

## 2019-01-07 NOTE — Progress Notes (Signed)
NAMEKeysean Savino, MRN:  [id number], DOB:  [birth year], LOS: 2 ADMISSION DATE:  01/13/2019, CONSULTATION DATE:  3/3 REFERRING MD:  Dr. Lorin Mercy [facility], CHIEF COMPLAINT:  Sepsis   Brief History   58 year old male admitted for severe sepsis secondary to pyelonephritis and possibly renal abscess/hemorrhage.   History of present illness   58 year old male with PMH as below, which is significant for A1A deficient emphysema, multiple myeloma on Revlimid, chronic respiratory failure on 3L home O2, and chronic L hydropneumothorax s/p VATS decortication of empyema in 2016. He was admitted in early February for PNA and developed MSSA bacteremia. No vegetation seen on TTE. He was treated with 2 weeks of antibiotics. Then 2/21 he presented with R flank pain and on imaging was found to have R hydronephrosis and was discharged with plans for urology follow up. It would seem as though this had not yet occurred when on 3/3 early AM he had worsening flank pain and noted hematuria. 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 He then became hypotensive despite IVF resuscitation and PCCM was asked to see in consultation.   Past Medical History   has a past medical history of Acute on chronic respiratory failure ([place]) (02/07/2018), Hypertension, Kidney stones, Multiple myeloma ([place]), Obstructive lung disease (generalized) ([place]), and Pulmonary hypertension ([place]).  Significant Hospital Events   3/03  Admit to [facility] with concern for pyelonephritis, possible renal abscess  Consults:  Urology 3/3  Procedures:  ET tube 3/4 >> R IJ CVC 3/4 >>  Right pyelogram, right diagnostic ureteroscopy with stent 3/4   Significant Diagnostic Tests:  CT abd pelvis 3/3 > Interval diffuse enlargement of the right kidney with interval areas of increased density in the right renal collecting system and right ureter,  compatible with hemorrhage, causing mild to moderate right hydronephrosis and moderate right hydroureter. Interval blood in the dependent portion of the urinary bladder. Mild small bowel ileus. Partial obstruction is less likely.  Micro Data:  Blood 3/3 >> Urine 3/3 >> negative BAL 3/4 >>  BAL fungal 3/4 >>  BAL AFB 3/4 >>   Antimicrobials:  Cefepime 3/3 > Vancomycin 3/3 > Flagyl 3/3 >   Interim history/subjective:  Intubated 3/4.  Underwent right ureteroscopy and stent placement 3/4 Currently on Precedex 1.2, sedated but will open eyes to voice Vasopressin at 0.03, norepinephrine weaned to 6 FiO2 0.55, PEEP 5, breathing over set rate Nurse reports that lower extremity Dopplers were required to detect pulses   Objective   Blood pressure 110/74, pulse 98, temperature 98.8 F (37.1 C), temperature source Axillary, resp. rate (!) 34, height 5' (1.524 m), weight 35.8 kg, SpO2 (!) 88 %. CVP:  [5 mmHg-11 mmHg] 5 mmHg  Vent Mode: PRVC FiO2 (%):  [40 %-100 %] 55 % Set Rate:  [16 bmp] 16 bmp Vt Set:  [400 mL] 400 mL PEEP:  [5 cmH20] 5 cmH20 Plateau Pressure:  [17 cmH20-35 cmH20] 30 cmH20   Intake/Output Summary (Last 24 hours) at 01/07/2019 1051 Last data filed at 01/07/2019 1028 Gross per 24 hour  Intake 4193.3 ml  Output 415 ml  Net 3778.3 ml   Filed Weights   01/24/2019 0941 01/16/2019 2030  Weight: 36.7 kg 35.8 kg    Examination: General: cachectic ill-appearing man, ventilated HEENT: ET tube in place.  Some very subtle palpable crepitus noted in the subclavicular region on the left  Neuro: Wakes to voice and stimulation, did not follow commands (question language barrier), moves his upper extremities CV: Tachycardic, regular, no murmur PULM: Tachypneic, ventilated, crackles on the right, decreased breath sounds on the left without any wheezing.  Suspected subcut air as detailed above GI: Soft, nontender, positive bowel sounds Extremities: No significant edema Skin: No  rash  Resolved Hospital Problem list     Assessment & Plan:   Septic shock secondary to R pyelonephritis vs renal abscess P: Continue to wean norepinephrine, goal MAP > 65 Continue vasopressin, can likely DC when norepinephrine dose gets to 3-4 Antibiotics as above Definitive source control by urology on 3/4, appreciate management Stress dose steroids, continue  R renal hemorrhage concerning for malignancy, none definitively seen on cystoscopy and ureteroscopy but visualization was difficult due to bleeding P: Receiving PRBC today Goal Hgb greater than 8.0 in setting of active blood loss Follow CBC serially  Acute on chronic hypoxemic respiratory failure due to septic shock and possible acute lung injury, new infiltrates particularly on the right Underlying Contributors: -Emphysema (A1A deficient) -Bronchiectasis -Probable ABPA -Abnormal left pleural space P: Mechanical ventilation, VT 400 Wean FiO2 as able.  Try to minimize PEEP given question subcutaneous air on the left Diuresis if/when he can tolerate Follow bronchoscopy results to evaluate for any evidence of superimposed infection Home Breo held Continue Brovana/Pulmicort/ipratropium as ordered   Complicated left pleural space with a history of VATS decortication for empyema (2016) and a chronic left hydropneumothorax.  Subcutaneous air noted on chest x-ray 3/5 and also exam. P: Question whether he may have a BPF or at least some pleural air leakage under positive pressure.  His left pleural space is difficult to interpret by chest x-ray, I think he needs a CT scan of the chest now to better define.  He should not have a left-sided chest tube and less he has mediastinal shift, progressive (life-threatening) left pneumothorax-if he gets a tube then it would probably cause a non-resolving BPF.   Chronic HFpEF P: Follow volume status, may become complicated if he requires volume resuscitation for his sepsis  AKI -in  setting of pre-renal + obstructive components -Stable serum creatinine and potassium 3/5 P: Continue to follow BMP, urine output closely Follow hematuria, appreciate urology management Replace electrolytes if indicated  Multiple myeloma P: Follow up as outpatient   Best practice:  Diet: NPO Pain/Anxiety/Delirium protocol (if indicated): Start fentanyl infusion 3/5 VAP protocol (if indicated): n/a DVT prophylaxis: SCDs GI prophylaxis: Pantoprazole 3/5 Glucose control: n/a Mobility: BR Code Status: FULL Family Communication: Discussed with his 2 nieces at bedside on 3/5 Disposition: ICU  Labs   CBC: Recent Labs  Lab 01/16/2019 1124 01/28/2019 0940 01/07/19 0410  WBC 16.2* 23.2* 27.7*  NEUTROABS 14.2*  --   --   HGB 9.5* 8.4* 6.5*  HCT 29.9* 27.2* 20.6*  MCV 75.5* 78.6* 78.6*  PLT 540* 411* 774    Basic Metabolic Panel: Recent Labs  Lab 01/03/2019 1124 01/27/2019 0816 01/10/2019 2312 01/07/19 0410  NA 131* 135 139 139  K 5.2* 5.9* 5.5* 5.3*  CL 90* 102 113* 115*  CO2 37* 19* 19* 18*  GLUCOSE 70 137* 202* 215*  BUN 28* 28* 37* 40*  CREATININE 1.56* 1.49* 1.41* 1.42*  CALCIUM 8.2* 7.0* 5.9* 5.8*   GFR: Estimated Creatinine Clearance: 29.4 mL/min (A) (by C-G formula based on SCr of 1.42 mg/dL (H)). Recent Labs  Lab 01/26/2019 1124 01/09/2019 1314 01/07/2019 2211 01/19/2019 1287 01/18/2019 0940 01/07/19 0410  WBC 16.2*  --   --   --  23.2* 27.7*  LATICACIDVEN 2.3* 3.1* 3.2* 5.1*  --   --     Liver Function Tests: Recent Labs  Lab 01/11/2019 1124  AST 18  ALT 10  ALKPHOS 81  BILITOT 0.7  PROT 6.4*  ALBUMIN 2.0*   Recent Labs  Lab 02/01/2019 1124  LIPASE 28   No results for input(s): AMMONIA in the last 168 hours.  ABG    Component Value Date/Time   PHART 7.277 (L) 01/19/2019 1230   PCO2ART 49.5 (H) 01/31/2019 1230   PO2ART 294 (H) 01/12/2019 1230   HCO3 22.3 01/20/2019 1230   TCO2 32 06/24/2016 0902   ACIDBASEDEF 4.0 (H) 01/25/2019 1230   O2SAT 99.0  01/10/2019 1230     Coagulation Profile: Recent Labs  Lab 01/23/2019 1124 01/07/19 0615  INR 1.1 1.9*    Cardiac Enzymes: Recent Labs  Lab 01/29/2019 1124 01/14/2019 0820 01/10/2019 1528 01/12/2019 2015  TROPONINI 0.07* 0.07* 0.07* 0.06*    HbA1C: No results found for: HGBA1C  CBG: No results for input(s): GLUCAP in the last 168 hours.   Critical care time: 34 minutes       Baltazar Apo, MD, PhD 01/07/2019, 11:17 AM [facility] [phone] or if no answer [phone]

## 2019-01-07 NOTE — Consult Note (Signed)
[facility] Surgery Consult Note  [name] [name] [birth year]  [id number].    Requesting MD: Dr. Lamonte Sakai Chief Complaint/Reason for Consult: Subcutaneous gas in the left chest wall on CT  HPI: [name] [name] is a 58 y.o. male with a Pmhx significant for A1A deficient emphysema, multiple myeloma on Revlimid, chronic respiratory failure on 3L home O2, and chronic L hydropneumothorax s/p VATS decortication of empyema in 2016 who was admitted on 3/3 for septic shock 2/2 pyelonephritis, renal hemorrhage and acute on chronic respiratory failure. Additionally, patient's CT was found to have a mass of the ureter that was concerning for cancer. The patient was admitted for further care and started on IV Cefepime/Vanc/Flagyl. On 3/4, the patient was intubated and taken to the OR for a cystoscopy with right retrograde pyelogram with right ureturoscopy and stenting. A chest xray was obtained yesterday with noted subcutaneous emphysema. CT of the chest was obtained today for further evaluation, which showed increasing amounts of subcutaneous emphysema on the left. There was concern this could be a necrotizing infection. General surgery was consulted to evaluation. No recent external procedures. Patient is currently on Precedex and vasopressin and intubated.   ROS: Review of Systems  Unable to perform ROS: Intubated  Level 5 Caveat. Intubated    Family History  Problem Relation Age of Onset  . Hypertension Other     Past Medical History:  Diagnosis Date  . Acute on chronic respiratory failure ([place]) 02/07/2018  . Hypertension   . Kidney stones   . Multiple myeloma ([place])   . Obstructive lung disease (generalized) ([place])   . Pulmonary hypertension ([place])     Past Surgical History:  Procedure Laterality Date  . CARDIAC CATHETERIZATION N/A 01/18/2016   Procedure: Right Heart Cath;  Surgeon: Jolaine Artist, MD;  Location: [place] [facility];  Service: Cardiovascular;  Laterality: N/A;  . CARDIAC CATHETERIZATION  N/A 06/24/2016   Procedure: Right Heart Cath;  Surgeon: Jolaine Artist, MD;  Location: [place] [facility];  Service: Cardiovascular;  Laterality: N/A;  . IR RADIOLOGIST EVAL & MGMT  05/27/2018  . IR RADIOLOGIST EVAL & MGMT  09/01/2018  . KIDNEY SURGERY    . VIDEO ASSISTED THORACOSCOPY (VATS)/DECORTICATION Left 2003  . VIDEO ASSISTED THORACOSCOPY (VATS)/DECORTICATION Left 10/02/2015   Procedure: VIDEO ASSISTED THORACOSCOPY (VATS)/DECORTICATION and drainage of chronic empyena;  Surgeon: [name], MD;  Location: [place];  Service: Thoracic;  Laterality: Left;  Marland Kitchen VIDEO BRONCHOSCOPY N/A 10/02/2015   Procedure: VIDEO BRONCHOSCOPY;  Surgeon: [name], MD;  Location: [place];  Service: Thoracic;  Laterality: N/A;    Social History:  reports that he has never smoked. He has never used smokeless tobacco. He reports previous alcohol use. He reports that he does not use drugs.  Allergies: No Known Allergies  Medications Prior to Admission  Medication Sig Dispense Refill  . dexamethasone (DECADRON) 4 MG tablet Take 5 tablets (20 mg total) by mouth once a week. 20 tablet 3  . Fluticasone-Umeclidin-Vilant (TRELEGY ELLIPTA) 100-62.5-25 MCG/INH AEPB Inhale 1 puff into the lungs daily. 1 each 5  . OXYGEN 3L BEDTIME ONLY    . predniSONE (DELTASONE) 10 MG tablet Take 1 tablet (10 mg total) by mouth daily. Taper dose: 35m po daily x 3 days, then 447mpo daily x 3 days, then 2039mo daily x 3 days, then 11m52m daily x 3 days, then 5mg 26mdaily x 2 days, then stop and resume decadron as previously prescribed    .  PROVENTIL HFA 108 (90 Base) MCG/ACT inhaler INHALE 2 PUFFS EVERY 6 HOURS AS NEEDED FOR WHEEZING OR SHORTNESS OF BREATH. (Patient taking differently: Inhale 2 puffs into the lungs every 6 (six) hours as needed for wheezing or shortness of breath. ) 3 each 0  . sulfamethoxazole-trimethoprim (BACTRIM DS,SEPTRA DS) 800-160 MG tablet Take 1 tablet by mouth 2 (two) times daily.      Prior to  Admission medications   Medication Sig Start Date End Date Taking? Authorizing Provider  dexamethasone (DECADRON) 4 MG tablet Take 5 tablets (20 mg total) by mouth once a week. 11/18/18  Yes Brunetta Genera, MD  Fluticasone-Umeclidin-Vilant (TRELEGY ELLIPTA) 100-62.5-25 MCG/INH AEPB Inhale 1 puff into the lungs daily. 07/31/18  Yes Tanda Rockers, MD  OXYGEN 3L BEDTIME ONLY   Yes [provider]  predniSONE (DELTASONE) 10 MG tablet Take 1 tablet (10 mg total) by mouth daily. Taper dose: 39m po daily x 3 days, then 483mpo daily x 3 days, then 2069mo daily x 3 days, then 57m77m daily x 3 days, then 5mg 77mdaily x 2 days, then stop and resume decadron as previously prescribed 12/19/18  Yes Chiu,Donne Hazel PROVENTIL HFA 108 (90 Base) MCG/ACT inhaler INHALE 2 PUFFS EVERY 6 HOURS AS NEEDED FOR WHEEZING OR SHORTNESS OF BREATH. Patient taking differently: Inhale 2 puffs into the lungs every 6 (six) hours as needed for wheezing or shortness of breath.  10/20/18  Yes McQuaJuanito Doom sulfamethoxazole-trimethoprim (BACTRIM DS,SEPTRA DS) 800-160 MG tablet Take 1 tablet by mouth 2 (two) times daily. 12/30/18 01/13/19 Yes [provider]    Blood pressure 95/71, pulse 95, temperature 98.9 F (37.2 C), temperature source Axillary, resp. rate (!) 24, height 5' (1.524 m), weight 35.8 kg, SpO2 92 %. Physical Exam: General: cachectic ill-appearing man, intubated HEENT: head is normocephalic, atraumatic.  Sclera are noninjected.  Ears and nose without any masses or lesions.  Intubated  Heart: tachycardic with regular rhythm. No palpable distal pulses. Nurse reports these were dopplered this morning.  Lungs: Intubated, tachypnic, crackles on the right. Decreased breath sounds on the left. Crepitus on chest palpation on the left chest wall. NO open wounds, erythema or warmth.  Abd: Soft, NT/ND, +BS, no masses, hernias, or organomegaly MS: No edema   Results for orders placed or  performed during the hospital encounter of 01/10/2019 (from the past 48 hour(s))  MRSA PCR Screening     Status: None   Collection Time: 01/18/2019  8:34 PM  Result Value Ref Range   MRSA by PCR NEGATIVE NEGATIVE    Comment:        The GeneXpert MRSA Assay (FDA approved for NASAL specimens only), is one component of a comprehensive MRSA colonization surveillance program. It is not intended to diagnose MRSA infection nor to guide or monitor treatment for MRSA infections. Performed at [facility] [place] acid, plasma     Status: Abnormal   Collection Time: 01/16/2019 10:11 PM  Result Value Ref Range   Lactic Acid, Venous 3.2 (HH) 0.5 - 1.9 mmol/L    Comment: CRITICAL RESULT CALLED TO, READ BACK BY AND VERIFIED WITH: G GOrlin Hilding301 09233/20 A NAVARRO Performed at [facility] [id number] metabolic panel     Status: Abnormal   Collection Time: 01/13/2019  8:16 AM  Result Value Ref Range  Sodium 135 135 - 145 mmol/L   Potassium 5.9 (H) 3.5 - 5.1 mmol/L    Comment: SLIGHT HEMOLYSIS   Chloride 102 98 - 111 mmol/L   CO2 19 (L) 22 - 32 mmol/L   Glucose, Bld 137 (H) 70 - 99 mg/dL   BUN 28 (H) 6 - 20 mg/dL   Creatinine, Ser 1.49 (H) 0.61 - 1.24 mg/dL   Calcium 7.0 (L) 8.9 - 10.3 mg/dL   GFR calc non Af Amer 52 (L) >60 mL/min   GFR calc Af Amer 60 (L) >60 mL/min   Anion gap 14 5 - 15    Comment: Performed at [facility], [place] [address] [place], [place] [address]  Lactic acid, plasma     Status: Abnormal   Collection Time: 01/04/2019  8:16 AM  Result Value Ref Range   Lactic Acid, Venous 5.1 (HH) 0.5 - 1.9 mmol/L    Comment: CRITICAL RESULT CALLED TO, READ BACK BY AND VERIFIED WITHCyndi Lennert RN AT [id number] 01/28/2019 MULLINS,T Performed at [facility], [place] [address] [place], [place] [address]   Troponin I - Now Then Q6H      Status: Abnormal   Collection Time: 01/10/2019  8:20 AM  Result Value Ref Range   Troponin I 0.07 (HH) <0.03 ng/mL    Comment: CRITICAL RESULT CALLED TO, READ BACK BY AND VERIFIED WITH: JONES,K. RN AT 0900 01/29/2019 MULLINS,T Performed at [facility], [place] [address], [place], [place] [address]   CBC     Status: Abnormal   Collection Time: 01/09/2019  9:40 AM  Result Value Ref Range   WBC 23.2 (H) 4.0 - 10.5 K/uL    Comment: WHITE COUNT CONFIRMED ON SMEAR   RBC 3.46 (L) 4.22 - 5.81 MIL/uL   Hemoglobin 8.4 (L) 13.0 - 17.0 g/dL   HCT 27.2 (L) 39.0 - 52.0 %   MCV 78.6 (L) 80.0 - 100.0 fL   MCH 24.3 (L) 26.0 - 34.0 pg   MCHC 30.9 30.0 - 36.0 g/dL   RDW 17.1 (H) 11.5 - 15.5 %   Platelets 411 (H) 150 - 400 K/uL   nRBC 0.0 0.0 - 0.2 %    Comment: Performed at [facility], [place] [address] [place], [place] [address]  Culture, respiratory     Status: None (Preliminary result)   Collection Time: 01/29/2019 11:52 AM  Result Value Ref Range   Specimen Description      BRONCHIAL ALVEOLAR LAVAGE Performed at [place] [address], [place], [place] [address]    Special Requests      NONE Performed at [facility], [place] [address] [place], [place] [address]    Gram Stain NO WBC SEEN NO ORGANISMS SEEN     Culture      NO GROWTH < 24 HOURS Performed at [facility], [place] [address] [place], [place] [address]    Report Status PENDING   Blood gas, arterial     Status: Abnormal   Collection Time: 01/24/2019 12:30 PM  Result Value Ref Range   FIO2 1.00    Delivery systems VENTILATOR    Mode PRESSURE REGULATED VOLUME CONTROL    VT 400 mL   LHR 16 resp/min   Peep/cpap 5.0 cm H20   pH, Arterial 7.277 (L) 7.350 - 7.450   pCO2 arterial 49.5 (H) 32.0 - 48.0 mmHg   pO2, Arterial 294 (H) 83.0 - 108.0 mmHg  Bicarbonate 22.3 20.0 - 28.0 mmol/L   Acid-base deficit 4.0 (H) 0.0 - 2.0 mmol/L   O2 Saturation 99.0 %    Patient temperature 98.6    Collection site BRACHIAL ARTERY    Drawn by [id number]    Sample type ARTERIAL DRAW    Allens test (pass/fail) PASS PASS    Comment: Performed at [facility], [place] [address], [place], [place] [address]  Troponin I - Now Then Q6H     Status: Abnormal   Collection Time: 02/02/2019  3:28 PM  Result Value Ref Range   Troponin I 0.07 (HH) <0.03 ng/mL    Comment: CRITICAL VALUE NOTED.  VALUE IS CONSISTENT WITH PREVIOUSLY REPORTED AND CALLED VALUE. Performed at [facility], [place] [address], [place], [place] [address]   Troponin I - Now Then Q6H     Status: Abnormal   Collection Time: 01/14/2019  8:15 PM  Result Value Ref Range   Troponin I 0.06 (HH) <0.03 ng/mL    Comment: CRITICAL VALUE NOTED.  VALUE IS CONSISTENT WITH PREVIOUSLY REPORTED AND CALLED VALUE. Performed at [facility], [place] [address] [place], [place] [address]   Basic metabolic panel     Status: Abnormal   Collection Time: 01/21/2019 11:12 PM  Result Value Ref Range   Sodium 139 135 - 145 mmol/L   Potassium 5.5 (H) 3.5 - 5.1 mmol/L   Chloride 113 (H) 98 - 111 mmol/L   CO2 19 (L) 22 - 32 mmol/L   Glucose, Bld 202 (H) 70 - 99 mg/dL   BUN 37 (H) 6 - 20 mg/dL   Creatinine, Ser 1.41 (H) 0.61 - 1.24 mg/dL   Calcium 5.9 (LL) 8.9 - 10.3 mg/dL    Comment: CRITICAL RESULT CALLED TO, READ BACK BY AND VERIFIED WITH: RN LANEY K. AT 006 01/07/19 CRUICKSHANK A    GFR calc non Af Amer 55 (L) >60 mL/min   GFR calc Af Amer >60 >60 mL/min   Anion gap 7 5 - 15    Comment: Performed at [facility], [place] [address], [place], [place] [address]  CBC     Status: Abnormal   Collection Time: 01/07/19  4:10 AM  Result Value Ref Range   WBC 27.7 (H) 4.0 - 10.5 K/uL   RBC 2.62 (L) 4.22 - 5.81 MIL/uL   Hemoglobin 6.5 (LL) 13.0 - 17.0 g/dL    Comment: REPEATED TO VERIFY THIS CRITICAL RESULT HAS VERIFIED AND BEEN CALLED TO S LANEY RN BY AMELIA NAVARRO ON  03 05 2020 AT 0440, AND HAS BEEN READ BACK.     HCT 20.6 (L) 39.0 - 52.0 %   MCV 78.6 (L) 80.0 - 100.0 fL   MCH 24.8 (L) 26.0 - 34.0 pg   MCHC 31.6 30.0 - 36.0 g/dL   RDW 17.3 (H) 11.5 - 15.5 %   Platelets 346 150 - 400 K/uL   nRBC 0.0 0.0 - 0.2 %    Comment: Performed at [facility], [place] [address], [place], [place] [address]  Basic metabolic panel     Status: Abnormal   Collection Time: 01/07/19  4:10 AM  Result Value Ref Range   Sodium 139 135 - 145 mmol/L   Potassium 5.3 (H) 3.5 - 5.1 mmol/L   Chloride 115 (H) 98 - 111 mmol/L   CO2 18 (L) 22 - 32 mmol/L   Glucose, Bld 215 (H) 70 - 99 mg/dL   BUN 40 (H)  6 - 20 mg/dL   Creatinine, Ser 1.42 (H) 0.61 - 1.24 mg/dL   Calcium 5.8 (LL) 8.9 - 10.3 mg/dL    Comment: CRITICAL VALUE NOTED.  VALUE IS CONSISTENT WITH PREVIOUSLY REPORTED AND CALLED VALUE.   GFR calc non Af Amer 55 (L) >60 mL/min   GFR calc Af Amer >60 >60 mL/min   Anion gap 6 5 - 15    Comment: Performed at [facility], [place] [address] [place], [place] [address]  ABO/Rh     Status: None   Collection Time: 01/07/19  5:39 AM  Result Value Ref Range   ABO/RH(D)      AB POS Performed at [facility], [place] [address], [place], [place] [address]   Type and screen [place]     Status: None (Preliminary result)   Collection Time: 01/07/19  6:15 AM  Result Value Ref Range   ABO/RH(D) AB POS    Antibody Screen NEG    Sample Expiration 01/10/2019    Unit Number [id number]    Blood Component Type RED CELLS,LR    Unit division 00    Status of Unit ISSUED    Transfusion Status OK TO TRANSFUSE    Crossmatch Result Compatible    Unit Number [id number]    Blood Component Type RED CELLS,LR    Unit division 00    Status of Unit ISSUED    Transfusion Status OK TO TRANSFUSE    Crossmatch Result      Compatible Performed at [facility], [place] [address], [place], [place]  [address]   Protime-INR     Status: Abnormal   Collection Time: 01/07/19  6:15 AM  Result Value Ref Range   Prothrombin Time 21.6 (H) 11.4 - 15.2 seconds   INR 1.9 (H) 0.8 - 1.2    Comment: (NOTE) INR goal varies based on device and disease states. Performed at [facility], [place] [address] [place], [place] [address]   PTT     Status: None   Collection Time: 01/07/19  6:15 AM  Result Value Ref Range   aPTT 36 24 - 36 seconds    Comment: Performed at [facility], [place] [address], [place], [place] [address]  Fibrinogen     Status: Abnormal   Collection Time: 01/07/19  6:15 AM  Result Value Ref Range   Fibrinogen 504 (H) 210 - 475 mg/dL    Comment: Performed at [facility], [place] [address], [place], [place] [address]  Prepare RBC     Status: None   Collection Time: 01/07/19  6:15 AM  Result Value Ref Range   Order Confirmation      ORDER PROCESSED BY BLOOD BANK Performed at [place] [address] [place],  [address]   Basic metabolic panel     Status: Abnormal   Collection Time: 01/07/19  1:41 PM  Result Value Ref Range   Sodium 142 135 - 145 mmol/L   Potassium 5.4 (H) 3.5 - 5.1 mmol/L   Chloride 117 (H) 98 - 111 mmol/L   CO2 18 (L) 22 - 32 mmol/L   Glucose, Bld 225 (H) 70 - 99 mg/dL   BUN 45 (H) 6 - 20 mg/dL   Creatinine, Ser 1.55 (H) 0.61 - 1.24 mg/dL   Calcium 5.7 (LL) 8.9 - 10.3 mg/dL    Comment: CRITICAL RESULT CALLED TO, READ BACK BY AND VERIFIED WITH: K.JONES RN  AT 1402 ON 01/07/19 BY S.VANHOORNE    GFR calc non Af Amer 49 (L) >60 mL/min   GFR calc Af Amer 57 (L) >60 mL/min   Anion gap 7 5 - 15    Comment: Performed at [facility], [place] [address] [place], [place] [address]   Ct Chest Wo Contrast  Addendum Date: 01/07/2019   ADDENDUM REPORT: 01/07/2019 13:32 ADDENDUM: Study discussed by telephone with Dr. Londell Moh on 01/07/2019 at 1306 hours. Subcutaneous gas in  the left chest wall was 1st apparent on portable chest x-ray 01/30/2019 at 1115 hours, and has increased since that time. There has been no attempted thoracentesis or other procedure to explain the gas that Dr. Lamonte Sakai knows of. Electronically Signed   By: Genevie Ann M.D.   On: 01/07/2019 13:32   Result Date: 01/07/2019 CLINICAL DATA:  58 year old male with history of A1A deficient emphysema, chronic respiratory failure on home oxygen, chronic left hydropneumothorax status post VATS in 2016. Multiple myeloma. MSSA bacteremia last month. Right renal collecting system obstruction and hemorrhage. Hypotension. EXAM: CT CHEST WITHOUT CONTRAST TECHNIQUE: Multidetector CT imaging of the chest was performed following the standard protocol without IV contrast. COMPARISON:  CT Abdomen and Pelvis 01/18/2019. Chest CT 12/06/2018 and earlier. FINDINGS: Cardiovascular: Mild cardiomegaly without evidence of pericardial effusion. Vascular patency is not evaluated in the absence of IV contrast. There is a right IJ approach central line. Mediastinum/Nodes: No mediastinal lymphadenopathy is evident in the absence of IV contrast. An enteric tube is in the esophagus and courses to the stomach. No pneumomediastinum. Lungs/Pleura: Chronic loculated left lateral hydropneumothorax with associated chronic left pleural thickening and some calcification. However, there is new multifocal left chest wall and left thoracic inlet subcutaneous gas. And this is associated with soft tissue swelling of the left lateral chest wall (series 2, image 82). Superimposed left apical and anterior left lung bullous emphysema, however, no new left lung pneumothorax is identified. There is however a new since February albeit small focus of peribronchial opacity which is contiguous with the left chest wall in the anterior upper lobe near the left 2/3 interspace seen on series 5, image 49. Intubated. Endotracheal tube terminates above the carina. The major airways  remain patent. No right pneumothorax. However, there is widespread consolidation and confluent peribronchial opacity now in the right upper lobe and right middle lobe. The right lower lobe consolidation has improved but not resolved since 12/06/2018, and aside from small volume increased right pleural fluid the right lung base appears stable from the recent CT Abdomen and Pelvis. There is superimposed right lower lobe bronchiectasis. Upper Abdomen: Enteric tube courses into the stomach. Stable visible noncontrast upper abdomen since 01/22/2019. Musculoskeletal: Abnormal left chest wall with soft tissue swelling and soft tissue gas as stated above. No acute or displaced left rib fracture is identified. The right chest wall appears stable since February. No acute or destructive osseous lesion identified. IMPRESSION: 1. Abnormal left chest wall soft tissue swelling and gas of unclear etiology. Consider a Necrotizing Infection of the left chest wall if this appearance is not explained by recent procedure or instrumentation. 2. Severe Multilobar Right Lung Pneumonia with progression since the February chest CT, although only mild new abnormal opacity in the left lung. Trace right pleural effusion. 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 3. ET tube and NG tube appear well placed. There is no pneumomediastinum. 4. Stable visible upper abdomen since 01/29/2019. Electronically Signed: By: Genevie Ann M.D. On: 01/07/2019  12:57   Dg Chest Port 1 View  Result Date: 01/07/2019 CLINICAL DATA:  Acute respiratory failure EXAM: PORTABLE CHEST 1 VIEW COMPARISON:  Yesterday FINDINGS: Endotracheal tube tip between the clavicular heads and carina. The orogastric tube reaches the stomach. Right IJ line with tip at the SVC. Extensive airspace disease, presumably pneumonia. There is chronic calcified pleural plaques and chronic pleural fluid and thickening at the left base where there may also be gas. Questionable  improvement in soft tissue emphysema along the thickened left chest wall. The source is uncertain. A call has been placed to the ordering provider. IMPRESSION: 1. Stable hardware positioning. 2. Acute airspace disease on the right, likely pneumonia. 3. Chronic pleural thickening and fluid greater on the left. 4. Continued emphysema in the thickened left chest wall soft tissues from uncertain source. Electronically Signed   By: Monte Fantasia M.D.   On: 01/07/2019 08:33   Dg Chest Port 1 View  Result Date: 02/02/2019 CLINICAL DATA:  Central line placement. EXAM: PORTABLE CHEST 1 VIEW COMPARISON:  Earlier today. FINDINGS: Endotracheal to tip 3 cm above the carina. Interval right jugular catheter with its tip in the superior vena cava. No pneumothorax. Nasogastric tube extending into the stomach with its side hole in the proximal to mid stomach. No significant change in extensive right lung airspace opacity, bilateral pleural effusions and left lung linear scarring. Bilateral calcified pleural plaques are again noted. The cardiac silhouette remains borderline enlarged. Increased left subcutaneous emphysema. Unremarkable bones. IMPRESSION: 1. Right jugular catheter tip in the superior vena cava without pneumothorax. 2. Stable extensive right lung pneumonia or alveolar edema. 3. Increased subcutaneous emphysema on the left. 4. 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 Electronically Signed   By: Claudie Revering M.D.   On: 01/13/2019 19:13   Portable Chest X-ray  Result Date: 01/19/2019 CLINICAL DATA:  Endotracheal tube EXAM: PORTABLE CHEST 1 VIEW COMPARISON:  01/25/2019 FINDINGS: Endotracheal tube in good position.  Gastric tube in the stomach Large area of infiltrate right upper lobe is new and compatible with pneumonia. Bibasilar airspace disease and bilateral pleural fluid or thickening is unchanged. Pleural calcification is present in the lung bases. IMPRESSION:  Endotracheal tube in good position. New area of infiltrate right upper lobe compatible with pneumonia. Question aspiration Electronically Signed   By: Franchot Gallo M.D.   On: 01/19/2019 11:58   Dg C-arm 1-60 Min-no Report  Result Date: 01/03/2019 Fluoroscopy was utilized by the requesting physician.  No radiographic interpretation.   Ct Renal Stone Study  Result Date: 01/18/2019 CLINICAL DATA:  Diffuse weakness and shortness of breath since 9 a.m. this morning. Hematuria. Multiple myeloma. EXAM: CT ABDOMEN AND PELVIS WITHOUT CONTRAST TECHNIQUE: Multidetector CT imaging of the abdomen and pelvis was performed following the standard protocol without IV contrast. COMPARISON:  12/25/2018. 01/23/2018. PET-CT dated 10/21/2017. FINDINGS: Lower chest: The previously demonstrated cavitation in the right lower lobe is no longer seen with increased irregular linear consolidation in that region, measuring 6.2 x 1.5 cm on image number 5 series 4. There is less air and more fluid in the loculated fluid and air collection in the left pleural space with irregular areas of linear density within the fluid as well as irregular, coarse calcifications. There are also calcified pleural plaques at the right lung base. Hepatobiliary: No focal liver abnormality is seen. No gallstones, gallbladder wall thickening, or biliary dilatation. Pancreas: Unremarkable. No pancreatic ductal dilatation or surrounding inflammatory changes. Spleen: Stable peripheral calcifications, most  likely due to previous trauma. Normal size and shape of the spleen. Adrenals/Urinary Tract: Interval diffuse enlargement of the right kidney with interval areas of mildly increased density in the renal collecting system and pelvis and extending into the proximal right ureter. The right renal collecting system is mildly to moderately dilated and the proximal ureter is moderately dilated. There is mild to moderate dilatation of the right ureter to the level of the  ureterovesical junction, containing the mildly increased density material. There is similar interval material in the dependent portion of the urinary bladder, greater on the right, with a small amount of linear calcific density. Again demonstrated are bilateral renal cysts. Normal appearing adrenal glands and left ureter. Stomach/Bowel: Multiple mildly dilated loops of small bowel. Gas, stool and high density contrast in normal caliber colon. High density contrast in a normal appearing appendix. Unremarkable stomach. Vascular/Lymphatic: Mild atheromatous arterial calcifications without aneurysm. No enlarged lymph nodes. Reproductive: Mildly enlarged prostate gland. Other: No abdominal wall hernia or abnormality. No abdominopelvic ascites. Musculoskeletal: Stable mild lumbar and lower thoracic spine degenerative changes, including facet degenerative changes with associated stable grade 1 anterolisthesis at the L4-5 level. There is a vacuum phenomenon within that disc. Stable previously demonstrated lytic lesion in the L4 vertebral body. IMPRESSION: 1. Interval diffuse enlargement of the right kidney with interval areas of increased density in the right renal collecting system and right ureter, compatible with hemorrhage, causing mild to moderate right hydronephrosis and moderate right hydroureter. The enlargement of the kidney is compatible with a combination of hydronephrosis and previously demonstrated pyelonephritis and the hemorrhage is compatible with hemorrhage associated with pyelonephritis. 2. Interval blood in the dependent portion of the urinary bladder. 3. Mild small bowel ileus. Partial obstruction is less likely. 4. Interval decrease in size of the previously demonstrated cavitation in the right lower lobe with increased irregular linear consolidation in that region compatible with developing scar tissue. 5. Decreased air and more fluid in the loculated left pleural fluid and air collection. 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 6. 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 7. Stable lytic lesion in the L4 vertebral body compatible with the patient's known multiple myeloma. Electronically Signed   By: Claudie Revering M.D.   On: 01/09/2019 16:10      Assessment/Plan Subcutaneous gas in the left chest wall  - Patient with hx of chronic L hydropneumothorax s/p VATS decortication of empyema in 2016 recently intubated on 3/4 now with findings of subcutaneous gas in the left chest wall. CT reviewed with Dr. Donne Hazel. Suspect this is gas 2/2 from positive pressure from lungs leaking into subq after intubation. Do not suspect necrotizing infection. Will continue to follow. Patient may benefit from cardiothoracic consult if continues or has other issues.    Jillyn Ledger, [facility] 01/07/2019, 2:20 PM Pager: [phone]

## 2019-01-07 NOTE — Progress Notes (Signed)
CRITICAL VALUE ALERT  Critical Value:  Calcium 5.7  Date & Time Notied:  01/07/2019 1629  Provider Notified: Dr.Byrum  Orders Received/Actions taken: Aware, no action at this time

## 2019-01-07 NOTE — Op Note (Signed)
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  PROCEDURE: 1.  Cystoscopy, right retrograde pyelogram creation. 2.  Diagnostic ureteroscopy. 3.  Insertion of right ureteral stent 7 x 24 Contour, no tether.  ESTIMATED BLOOD LOSS:  Approximately 50 mL of old blood.  FINDINGS: 1.  Large volume of formed clot in the entire right ureter. 2.  Very poor visualization of the right ureter, unable to definitively rule out cancer. 3.  Placement of right ureteral stent proximal and upper pole distal urinary bladder.  INDICATIONS:  The patient is a pleasant but very comorbid 58 year old Asian man with history of multiple myeloma as well as significant pulmonary compromise at baseline.  He has had several episodes of pyelonephritis over the past several months.  He has  had some hydronephrosis on the right side as well.  He had imaging approximately a month ago that revealed a questionable enhancing mass in his upper ureter.  He re-presented this admission with a clinical syndrome concerning for repeat pyelonephritis  with worsening right hydronephrosis and obstructive parameters.  It was clearly felt that renal decompression was warranted.  Options were discussed with the recommended path of stenting given the concern for possible cancer and wanting to avoid seeding  of any percutaneous tracts.  I also recommended diagnostic ureteroscopy and possible biopsy to help rule in or rule out urothelial carcinoma of the ureter.  This may greatly affect decision making going forward.  The patient and family wished to proceed.    DESCRIPTION OF PROCEDURE:  The patient was identified.  The procedure being cystoscopy, right ureteral stent placement, ureteroscopy, possible  biopsy confirmed.  Procedure timeout was performed.  Intravenous antibiotics administered.  General  endotracheal anesthesia was induced.  The patient was placed into a low lithotomy position, sterile field was created prepped and draped base of his penis, perineum and proximal thighs using iodine.  Exquisite care was taken to avoid bony prominences  given his very thin habitus.  Cystourethroscopy was then performed using 22-French rigid cystoscope offset lens.  Inspection of anterior and posterior urethra was unremarkable.  Inspection of bladder revealed a significant volume of mostly thin blood  within the urinary bladder.  This was easily irrigated.  There were no obvious urothelial lesions with inspection of the bladder.  Ureteral orifices were singleton and there was formed clot at the right ureteral orifice which was then cannulated with a  6-French renal catheter and right retrograde pyelogram was obtained.  Right retrograde pyelogram demonstrates a single right ureter single system right kidney.  There was significant tortuosity and multifocal filling defects consistent with clot versus tumor.  A 0.038 ZIPwire was advanced lower pole and set aside as a  safety wire.  An 8-French feeding tube was placed in the urinary bladder for pressure release, and semirigid ureteroscopy was performed the entire length right ureter alongside a separate sensor working wire.  There was significant volume of formed clot  multifocal within the right ureter, which did not allow good visualization of the urothelium.  There were no obvious fairly large obstructing tumors.  A single pass was placed at the ureteroscope with total ureteroscopy time less than 1 minute.  As such,  the goals of renal decompression became priority and a new 7 x 24 Contour Polaris-type stent was placed remaining safety wire using cystoscopic  and fluoroscopic guidance.  Good proximal and distal plane were noted.  Efflux of copious urine was seen   around into the distal end of the stent.  A 16-French Foley catheter was placed free to straight drain connected with a 10 mL of water in the balloon.  The procedure was terminated.  The patient tolerated the procedure well.  No immediate periprocedural  complications.  The patient returned to the ICU in critical condition.  TN/NUANCE  D:01/17/2019 T:01/03/2019 JOB:[id number]

## 2019-01-07 NOTE — Progress Notes (Signed)
eLink Physician-Brief Progress Note Patient Name: Vahe Pienta DOB: Jun 16, 1961 MRN: [id number]   Date of Service  01/07/2019  HPI/Events of Note  K+ 5.5  eICU Interventions  Kayexalate 15 gm via NG tube x 1, repeat BMET in am        Frederik Pear 01/07/2019, 12:17 AM

## 2019-01-07 NOTE — Progress Notes (Signed)
eLink Physician-Brief Progress Note Patient Name: Jerrold Haskell DOB: 01-Dec-1960 MRN: [id number]   Date of Service  01/07/2019  HPI/Events of Note  Hypotension on relatively low dose Precedex. He is on Norepinephrine and Vasopressin.  eICU Interventions  Discontinue Precedex, Fentanyl infusion which should have less of a hemodynamic impact, 5 % Albumin 500 ml iv bolus x 1        Auset Fritzler U Dorsey Charette 01/07/2019, 10:07 PM

## 2019-01-07 NOTE — Progress Notes (Addendum)
CRITICAL VALUE ALERT  Critical Value:  K 5.9  Date & Time Notied:  01/07/2019 0005  Provider Notified: Warren Lacy  Orders Received/Actions taken: Awaiting orders  Mariann Laster, RN

## 2019-01-08 ENCOUNTER — Telehealth: Payer: Self-pay

## 2019-01-08 ENCOUNTER — Encounter: Payer: Self-pay | Admitting: Hematology

## 2019-01-08 ENCOUNTER — Inpatient Hospital Stay (HOSPITAL_COMMUNITY): Payer: Medicare Other

## 2019-01-08 LAB — BPAM RBC
Blood Product Expiration Date: 202004012359
Blood Product Expiration Date: 202004012359
ISSUE DATE / TIME: 202003050820
ISSUE DATE / TIME: 202003051042
UNIT TYPE AND RH: 6200
Unit Type and Rh: 6200

## 2019-01-08 LAB — TYPE AND SCREEN
ABO/RH(D): AB POS
Antibody Screen: NEGATIVE
Unit division: 0
Unit division: 0

## 2019-01-08 LAB — CBC
HCT: 40.8 % (ref 39.0–52.0)
HCT: 39.5 % (ref 39.0–52.0)
Hemoglobin: 12.1 g/dL — ABNORMAL LOW (ref 13.0–17.0)
Hemoglobin: 12 g/dL — ABNORMAL LOW (ref 13.0–17.0)
MCH: 27.1 pg (ref 26.0–34.0)
MCH: 27.1 pg (ref 26.0–34.0)
MCHC: 29.7 g/dL — ABNORMAL LOW (ref 30.0–36.0)
MCHC: 30.4 g/dL (ref 30.0–36.0)
MCV: 91.2 fL (ref 80.0–100.0)
MCV: 89.4 fL (ref 80.0–100.0)
NRBC: 4.9 % — AB (ref 0.0–0.2)
PLATELETS: 192 10*3/uL (ref 150–400)
Platelets: 119 10*3/uL — ABNORMAL LOW (ref 150–400)
RBC: 4.46 MIL/uL (ref 4.22–5.81)
RBC: 4.42 MIL/uL (ref 4.22–5.81)
RDW: 19.5 % — ABNORMAL HIGH (ref 11.5–15.5)
RDW: 19.4 % — ABNORMAL HIGH (ref 11.5–15.5)
WBC: 20.7 10*3/uL — ABNORMAL HIGH (ref 4.0–10.5)
WBC: 22.4 10*3/uL — ABNORMAL HIGH (ref 4.0–10.5)
nRBC: 2.2 % — ABNORMAL HIGH (ref 0.0–0.2)

## 2019-01-08 LAB — BLOOD GAS, ARTERIAL
Acid-base deficit: 18.4 mmol/L — ABNORMAL HIGH (ref 0.0–2.0)
Acid-base deficit: 14.5 mmol/L — ABNORMAL HIGH (ref 0.0–2.0)
Acid-base deficit: 17.5 mmol/L — ABNORMAL HIGH (ref 0.0–2.0)
Bicarbonate: 10.9 mmol/L — ABNORMAL LOW (ref 20.0–28.0)
Bicarbonate: 13.4 mmol/L — ABNORMAL LOW (ref 20.0–28.0)
Bicarbonate: 11.8 mmol/L — ABNORMAL LOW (ref 20.0–28.0)
Drawn by: 308601
Drawn by: 308601
Drawn by: 308601
FIO2: 100
FIO2: 100
FIO2: 80
MECHVT: 300 mL
MECHVT: 300 mL
O2 Saturation: 96.7 %
O2 Saturation: 98.3 %
O2 Saturation: 94.6 %
PEEP: 10 cmH2O
PEEP: 10 cmH2O
PEEP: 10 cmH2O
PO2 ART: 172 mmHg — AB (ref 83.0–108.0)
Patient temperature: 99.1
Patient temperature: 99.1
Patient temperature: 99.1
RATE: 35 resp/min
RATE: 35 resp/min
RATE: 35 resp/min
VT: 300 mL
pCO2 arterial: 38.9 mmHg (ref 32.0–48.0)
pCO2 arterial: 40.3 mmHg (ref 32.0–48.0)
pCO2 arterial: 41 mmHg (ref 32.0–48.0)
pH, Arterial: 7.078 — CL (ref 7.350–7.450)
pH, Arterial: 7.151 — CL (ref 7.350–7.450)
pH, Arterial: 7.088 — CL (ref 7.350–7.450)
pO2, Arterial: 127 mmHg — ABNORMAL HIGH (ref 83.0–108.0)
pO2, Arterial: 102 mmHg (ref 83.0–108.0)

## 2019-01-08 LAB — CULTURE, RESPIRATORY: CULTURE: NO GROWTH

## 2019-01-08 LAB — COMPREHENSIVE METABOLIC PANEL
ALT: 494 U/L — ABNORMAL HIGH (ref 0–44)
AST: 1869 U/L — ABNORMAL HIGH (ref 15–41)
Albumin: 2 g/dL — ABNORMAL LOW (ref 3.5–5.0)
Alkaline Phosphatase: 87 U/L (ref 38–126)
Anion gap: 13 (ref 5–15)
BUN: 45 mg/dL — ABNORMAL HIGH (ref 6–20)
CO2: 15 mmol/L — ABNORMAL LOW (ref 22–32)
Calcium: 6.9 mg/dL — ABNORMAL LOW (ref 8.9–10.3)
Chloride: 114 mmol/L — ABNORMAL HIGH (ref 98–111)
Creatinine, Ser: 2.09 mg/dL — ABNORMAL HIGH (ref 0.61–1.24)
GFR calc non Af Amer: 34 mL/min — ABNORMAL LOW (ref >60)
GFR, EST AFRICAN AMERICAN: 40 mL/min — AB (ref >60)
Glucose, Bld: 304 mg/dL — ABNORMAL HIGH (ref 70–99)
Potassium: 5.1 mmol/L (ref 3.5–5.1)
Sodium: 142 mmol/L (ref 135–145)
Total Bilirubin: 2.7 mg/dL — ABNORMAL HIGH (ref 0.3–1.2)
Total Protein: 4.2 g/dL — ABNORMAL LOW (ref 6.5–8.1)

## 2019-01-08 LAB — GLUCOSE, CAPILLARY: Glucose-Capillary: 197 mg/dL — ABNORMAL HIGH (ref 70–99)

## 2019-01-08 LAB — BASIC METABOLIC PANEL
Anion gap: 11 (ref 5–15)
BUN: 47 mg/dL — ABNORMAL HIGH (ref 6–20)
CO2: 13 mmol/L — ABNORMAL LOW (ref 22–32)
Calcium: 5.1 mg/dL — CL (ref 8.9–10.3)
Chloride: 113 mmol/L — ABNORMAL HIGH (ref 98–111)
Creatinine, Ser: 2.05 mg/dL — ABNORMAL HIGH (ref 0.61–1.24)
GFR calc Af Amer: 41 mL/min — ABNORMAL LOW (ref >60)
GFR calc non Af Amer: 35 mL/min — ABNORMAL LOW (ref >60)
Glucose, Bld: 312 mg/dL — ABNORMAL HIGH (ref 70–99)
POTASSIUM: 6 mmol/L — AB (ref 3.5–5.1)
Sodium: 137 mmol/L (ref 135–145)

## 2019-01-08 LAB — CULTURE, RESPIRATORY W GRAM STAIN: Gram Stain: NONE SEEN

## 2019-01-08 LAB — MAGNESIUM
Magnesium: 2 mg/dL (ref 1.7–2.4)
Magnesium: 2 mg/dL (ref 1.7–2.4)

## 2019-01-08 MED ORDER — AMIODARONE LOAD VIA INFUSION: 150.0000 mg | Freq: Once | INTRAVENOUS | Status: DC

## 2019-01-08 MED ORDER — SODIUM BICARBONATE 8.4 % IV SOLN
INTRAVENOUS | Status: AC
  Administered 2019-01-08: 50 meq
  Filled 2019-01-08: qty 50

## 2019-01-08 MED ORDER — MIDAZOLAM HCL 2 MG/2ML IJ SOLN
2.0000 mg | Freq: Once | INTRAMUSCULAR | Status: AC
  Administered 2019-01-07: 2 mg via INTRAVENOUS

## 2019-01-08 MED ORDER — SODIUM BICARBONATE 8.4 % IV SOLN
100.0000 meq | Freq: Once | INTRAVENOUS | Status: AC
  Administered 2019-01-08: 100 meq via INTRAVENOUS

## 2019-01-08 MED ORDER — SODIUM BICARBONATE 8.4 % IV SOLN
100.0000 meq | Freq: Once | INTRAVENOUS | Status: AC
  Administered 2019-01-08: 50 meq via INTRAVENOUS

## 2019-01-08 MED ORDER — SODIUM CHLORIDE 0.9 % IV SOLN
1.2500 ng/kg/min | INTRAVENOUS | Status: DC
  Administered 2019-01-08: 5 ng/kg/min via INTRAVENOUS
  Filled 2019-01-08: qty 1

## 2019-01-08 MED ORDER — INSULIN ASPART 100 UNIT/ML ~~LOC~~ SOLN
0.0000 [IU] | SUBCUTANEOUS | Status: DC
  Administered 2019-01-08: 2 [IU] via SUBCUTANEOUS

## 2019-01-08 MED ORDER — INSULIN REGULAR(HUMAN) IN NACL 100-0.9 UT/100ML-% IV SOLN: INTRAVENOUS | Status: DC

## 2019-01-08 MED ORDER — ADULT MULTIVITAMIN W/MINERALS CH: 1.0000 | ORAL_TABLET | Freq: Every day | ORAL | Status: DC

## 2019-01-08 MED ORDER — SODIUM CHLORIDE 0.9 % IV SOLN
0.0000 ug/min | INTRAVENOUS | Status: DC
  Administered 2019-01-08: 200 ug/min via INTRAVENOUS
  Administered 2019-01-08: 400 ug/min via INTRAVENOUS
  Filled 2019-01-08 (×2): qty 4

## 2019-01-08 MED ORDER — AMIODARONE IV BOLUS ONLY 150 MG/100ML
150.0000 mg | Freq: Once | INTRAVENOUS | Status: AC
  Administered 2019-01-08: 150 mg via INTRAVENOUS
  Filled 2019-01-08: qty 100

## 2019-01-08 MED ORDER — NOREPINEPHRINE BITARTRATE 1 MG/ML IV SOLN
0.0000 ug/min | INTRAVENOUS | Status: DC
  Administered 2019-01-08: 40 ug/min via INTRAVENOUS
  Filled 2019-01-08: qty 16

## 2019-01-08 MED ORDER — STERILE WATER FOR INJECTION IV SOLN
INTRAVENOUS | Status: DC
  Administered 2019-01-08: 02:00:00 via INTRAVENOUS
  Filled 2019-01-08: qty 850

## 2019-01-08 MED ORDER — CALCIUM GLUCONATE-NACL 2-0.675 GM/100ML-% IV SOLN
2.0000 g | Freq: Once | INTRAVENOUS | Status: AC
  Administered 2019-01-08: 2000 mg via INTRAVENOUS
  Filled 2019-01-08 (×2): qty 100

## 2019-01-08 MED ORDER — CALCIUM GLUCONATE 10 % IV SOLN: 1.0000 g | Freq: Once | INTRAVENOUS | Status: DC

## 2019-01-08 MED ORDER — SODIUM BICARBONATE 8.4 % IV SOLN
INTRAVENOUS | Status: AC
  Administered 2019-01-08: 02:00:00
  Filled 2019-01-08: qty 50

## 2019-01-08 MED ORDER — CALCIUM GLUCONATE-NACL 1-0.675 GM/50ML-% IV SOLN
1.0000 g | Freq: Once | INTRAVENOUS | Status: AC
  Administered 2019-01-08: 1000 mg via INTRAVENOUS
  Filled 2019-01-08: qty 50

## 2019-01-08 MED ORDER — ENSURE ENLIVE PO LIQD: 237.0000 mL | Freq: Three times a day (TID) | ORAL | Status: DC

## 2019-01-10 LAB — CULTURE, BLOOD (ROUTINE X 2)
Culture: NO GROWTH
Culture: NO GROWTH
SPECIAL REQUESTS: ADEQUATE
Special Requests: ADEQUATE

## 2019-01-11 ENCOUNTER — Inpatient Hospital Stay: Payer: Self-pay | Admitting: Internal Medicine

## 2019-01-12 ENCOUNTER — Inpatient Hospital Stay (INDEPENDENT_AMBULATORY_CARE_PROVIDER_SITE_OTHER): Payer: Medicare Other | Admitting: Primary Care

## 2019-01-14 ENCOUNTER — Ambulatory Visit: Payer: Self-pay | Admitting: Hematology

## 2019-01-14 ENCOUNTER — Other Ambulatory Visit: Payer: Self-pay

## 2019-01-14 NOTE — Telephone Encounter (Signed)
Received signed original D/C -Funeral home notified for pick up.

## 2019-01-21 ENCOUNTER — Other Ambulatory Visit: Payer: Self-pay | Admitting: Pulmonary Disease

## 2019-02-03 NOTE — Progress Notes (Signed)
documentation for Glenice Laine, RN- bilateral soft wrist restraints discontinued 01/07/2019 2000.

## 2019-02-03 NOTE — Progress Notes (Signed)
Patient extubated by RT. Lines removed by RN.  Family at bedside.

## 2019-02-03 NOTE — Progress Notes (Signed)
RT attempted Aline X's 2 without success, MD aware.  RT to monitor and assess as needed.

## 2019-02-03 NOTE — Telephone Encounter (Signed)
Received dc from [facility] (original copy) DC is for burial. Patient is a patient of Doctor Icard.  DC will be taken to [facility] for signature.

## 2019-02-03 NOTE — Progress Notes (Signed)
[facility] PCCM PROGRESS NOTE  S: 58 year old male admitted 3/3 with severe sepsis secondary to R pyelonephritis vs renal abscess. Had ureteroscopy 3/4 and has been in shock since that time. In the evening hours of 3/5 he had a swift decompensation including refractory shock and hypoxemia. I was called to bedside after the patient had been started on phenylephrine (levo 48mcg, vaso shock dose, phenylephrine 223mcg)and still had MAP in the 40s with pulse oximetry in the 50s. Respiratory had attempted to place arterial line without success.   O: BP 105/65 (BP Location: Left Wrist)   Pulse (!) 112   Temp 99.2 F (37.3 C) (Axillary)   Resp (!) 28   Ht 5' (1.524 m)   Wt 35.8 kg   SpO2 93%   BMI 15.41 kg/m   General:  Thin elderly appearing male on vent Neuro:  Unresponsive HEENT:  [place]/AT, PERRL, no appreciable JVD Cardiovascular:  RRR, no MRG Lungs:  Coarse bilaterally.  Abdomen:  Soft, non-tender, non-distended Musculoskeletal:  No acute deformity. Diffuse edema/anasarca.  Skin:  Intact, MMM   I was unable to place femoral art line so placed brachial  (see procedure note)  Unable to obtain ABG prior and assumed he was acidemic. Gave one amp of bicarb, which prompted extreme tachycardia that self corrected.   After art line placed, ABG done as follows  ABG    Component Value Date/Time   PHART 7.078 (LL) 2019-01-13 0100   PCO2ART 38.9 Jan 13, 2019 0100   PO2ART 127 (H) 01-13-2019 0100   HCO3 10.9 (L) 13-Jan-2019 0100   TCO2 32 06/24/2016 0902   ACIDBASEDEF 18.4 (H) 01-13-19 0100   O2SAT 96.7 01-13-2019 0100   PO2 127 with O2 sats reading 60s on monitor   A/P:  Septic shock - Continue ABX - Start angiotensin 2 infusion - Art line placed - ICU hemodynamic monitoring - Not much else we can do beyond what we are doing.  ARDS newly progressed - PEEP increased to 12 just prior to ABG - Vt decreased to 6cc/kg as plateau pressures were in 40s - RR increased to 30 in attempt to match  minute ventilation.  - Will need to repeat ABG - Not much else we can do beyond what we are doing.   Non Gap acidosis - start sodium bicarb infusion  Hypocalcemia - Give Ca  Hyperkalemia likely due to acidemia. - Bicarb infusion and repeat labs in a couple hours.    Family has been updated by Dr Lucile Shutters in [facility] and have elected DNR should he arrest. Full medical care otherwise. I have updated family as well and agree that should he suffer an arrest in this setting it would be non-survivable. His condition in the absence of arrest may very well be non-survivable. They are aware and understand they can re-visit code status should his condition improve.   [name], AGACNP-BC [facility] Pulmonology/Critical Care Pager [phone] or [phone]

## 2019-02-03 NOTE — Progress Notes (Signed)
eLink Physician-Brief Progress Note Patient Name: [name] [name] DOB: November 01, 1961 MRN: [id number]   Date of Service  01/12/2019  HPI/Events of Note  Hyperglycemia  eICU Interventions  Sensitive  Hyperglycemia scale Humalog insulin coverage ordered        Frederik Pear Jan 12, 2019, 4:37 AM

## 2019-02-03 NOTE — Progress Notes (Signed)
Patient asystole at 743am. Verified by myself and Tama Headings, RN.  Family at bedside. Emotional support offered to family members and have requested chaplain to visit. Chaplain contacted. Ventilator and medication drips turned off.   Critical care NP made aware.

## 2019-02-03 NOTE — Accreditation Note (Signed)
o Restraints not reported to CMS Pursuant to regulation 482.13 (G) (3) use of soft wrist restraints was logged on 01/11/2019

## 2019-02-03 NOTE — Procedures (Signed)
Arterial Catheter Insertion Procedure Note [name] [name] [id number] 1961-07-13  Procedure: Insertion of Arterial Catheter  Indications: Blood pressure monitoring  Procedure Details Consent: Unable to obtain consent because of emergent medical necessity. Time Out: Verified patient identification, verified procedure, site/side was marked, verified correct patient position, special equipment/implants available, medications/allergies/relevent history reviewed, required imaging and test results available.  Performed  Maximum sterile technique was used including antiseptics, cap, hand hygiene and mask. Was unable to uttilize full sterile technique due to emergent need.  Skin prep: Chlorhexidine; local anesthetic administered 20 gauge catheter was inserted into left brachial artery using the Seldinger technique. ULTRASOUND GUIDANCE USED: YES Evaluation Blood flow good; BP tracing good. Complications: No apparent complications.   Attempted femoral artrial line prior on both sides without success. Arteries were heavily calcified and vasospastic. Able to access artery with needle, however, unable to advance guidewire on both sides. Aborted and emergently placed L brachial line.   [name] [name], AGACNP-BC [place] Pager [phone] or [phone]  2019-02-03 1:10 AM

## 2019-02-03 NOTE — Progress Notes (Signed)
eLink Physician-Brief Progress Note Patient Name: Amias Hutchinson DOB: 01-Jan-1961 MRN: [id number]   Date of Service  01-09-2019  HPI/Events of Note  Hypotension likely secondary to profound acidosis and hypocalcemia  eICU Interventions  Calcium gluconate 1 gm iv bolus, sodium bicarbonate 100 meq iv , bicarb infusion increased to 125 ml/hr        Thailan Sava U Jihad Brownlow 2019-01-09, 6:04 AM

## 2019-02-03 NOTE — Progress Notes (Signed)
65ml fentanyl drip wasted. Witnessed by Nanci Pina, RN.

## 2019-02-03 NOTE — Progress Notes (Addendum)
Peri-arrest events starting at 0203 with Dr. Lucile Shutters on camera in room given orders:  Pt is a DNR, no compressions took place, all medications given and cardioversion were approved by family that wre at bedside.  0203 SVT rate 220s 0204 Cardioverted @ 50 Joules (did not convert) 0205 Sodium Bicarb 2 amps given per IV 0206 Cardioverted 2nd time @ 100 Joules (HR briefly slowed to 80 to 120 then back to SVT) 0210 Amiodarone 150mg  IVP (from Code Cart) 0212 Heart rhythm intermittently changing between A-fib/flutter and SVT per Dr. Lucile Shutters. 0220 Epi 0.78ml of a 1mg /106ml amp of Epi given IV 0226 Epi 53ml of a 1mg /16ml amp of Epi given IV 0230 Heart rhythm now NSR with rate in 80s to 100s.  Above events charted on CPR document and placed in shadow chart.  Jacqulyn Ducking ICU/SD Care Coordinator / Rapid Response Nurse Rapid Response Number:  [id number]

## 2019-02-03 NOTE — Progress Notes (Signed)
Name: [name] [name]  Location: ICU room [place] of Call: Pastoral Visit  Summary of Visit: Chaplain received a call at 12:00am and was asked to provide emotional/ spiritual support to family of Pt in 1240. Rn reported that family members were tearful, due to recent medical developments. When chaplain arrived 3 family members were waiting in the family room. Medical Staff at bedside- finishing a procedure. Chaplain offered prayer and extended hospitality via coffee, soda, and crackers. Chaplain stayed with family until they were allowed back into the room.  Please page again if family seems to be in more need of emotional support.  Thanks,  Tourist information centre manager

## 2019-02-03 NOTE — Progress Notes (Signed)
documentation for Glenice Laine, RN- bilateral soft wrist restraints discontinued 01/07/2019 2000

## 2019-02-03 DEATH — deceased

## 2019-02-04 LAB — FUNGUS CULTURE RESULT

## 2019-02-04 LAB — FUNGUS CULTURE WITH STAIN

## 2019-02-04 LAB — FUNGAL ORGANISM REFLEX

## 2019-02-05 DIAGNOSIS — N12 Tubulo-interstitial nephritis, not specified as acute or chronic: Secondary | ICD-10-CM | POA: Diagnosis present

## 2019-02-18 LAB — ACID FAST CULTURE WITH REFLEXED SENSITIVITIES (MYCOBACTERIA): Acid Fast Culture: NEGATIVE

## 2019-03-05 NOTE — Death Summary Note (Signed)
DEATH SUMMARY   Patient Details  Name: [name] [name] MRN: [id number] DOB: 23-Aug-1961  Admission/Discharge Information   Admit Date:  01-12-19  Date of Death: Date of Death: 01-15-2019  Time of Death: Time of Death: 0743  Length of Stay: 3  Referring Physician: [name], PA-C   Reason(s) for Hospitalization  Septic shock with right hydronephrosis  Diagnoses  Preliminary cause of death:  Secondary Diagnoses (including complications and co-morbidities):  Principal Problem:   Septic shock ([place]) Active Problems:   Acute on chronic respiratory failure with hypoxia ([facility])   Hydropneumothorax   Pulmonary HTN ([place])   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 year old male with a complicated past medical history that includes immunosuppression due to multiple myeloma and Revlimid, resultant severe bronchiectasis, alpha-1 antitrypsin deficiency with emphysematous COPD.  History of a chronic left hydropneumothorax in the aftermath of a VATS decortication for empyema in 2014/12/22.  He has chronic respiratory failure on supplemental oxygen, impaired respiratory status due to all the above.  He was under evaluation for right flank pain with right-sided hydronephrosis as an outpatient by urology.  Was then seen in the emergency department 2019/01/12 with progressive pain, hematuria.  A CT of his abdomen revealed significant hydronephrosis, hydroureter.  He was found to be hypotensive and this progressed despite IV fluids.  Pressors were initiated.  He was treated empirically with antibiotics for  presumed pyelonephritis and possible renal abscess.  Urology evaluated he underwent right ureteroscopy and stent placement 3/4.  He returned to the ICU intubated and sedated, remained on pressors.  On 3/5 he was found to have left-sided subcutaneous air on exam and on chest x-ray, etiology unclear.  A repeat CT scan of his chest and abdomen were performed on 3/5 that showed no significant change in his left-sided hydropneumothorax but there was abnormal left chest wall soft tissue swelling with subcutaneous air concerning for possible necrotizing infection.  CCS was consulted for evaluation and felt that the subcutaneous air was most likely due to transient BPF from the abnormal left pleural space with positive pressure from mechanical ventilation.  There was no clear evidence for a tracking necrotizing infection.  He experienced progressive shock on the evening of 4/3 and pressors were adjusted.  Based on discussions with the patient's family decision was made that CPR will be deferred should he decline.  He developed SVT early morning 3/6 and although chest compressions were deferred he received medications and was cardioverted.  He was initially back in NSR but became progressively unstable, hypotensive and ultimately developed asystole later that morning. No further resuscitative efforts were made.     Pertinent Labs and Studies  Significant Diagnostic Studies Ct Chest Wo Contrast  Addendum Date: 01/07/2019   ADDENDUM REPORT: 01/07/2019 13:32 ADDENDUM: Study discussed by telephone with Dr. Londell Moh on 01/07/2019 at 1306 hours. Subcutaneous gas in the left chest wall was 1st apparent on portable chest x-ray 01/16/2019 at 1115 hours, and has increased since that time. There has been no attempted thoracentesis or other procedure to explain the gas that Dr.  Vahe Pienta knows of. Electronically Signed   By: Genevie Ann M.D.   On: 01/07/2019 13:32   Result Date: 01/07/2019 CLINICAL DATA:  58 year old male with history of  A1A deficient emphysema, chronic respiratory failure on home oxygen, chronic left hydropneumothorax status post VATS in 2016. Multiple myeloma. MSSA bacteremia last month. Right renal collecting system obstruction and hemorrhage. Hypotension. EXAM: CT CHEST WITHOUT CONTRAST TECHNIQUE: Multidetector CT imaging of the chest was performed following the standard protocol without IV contrast. COMPARISON:  CT Abdomen and Pelvis 01/29/2019. Chest CT 12/06/2018 and earlier. FINDINGS: Cardiovascular: Mild cardiomegaly without evidence of pericardial effusion. Vascular patency is not evaluated in the absence of IV contrast. There is a right IJ approach central line. Mediastinum/Nodes: No mediastinal lymphadenopathy is evident in the absence of IV contrast. An enteric tube is in the esophagus and courses to the stomach. No pneumomediastinum. Lungs/Pleura: Chronic loculated left lateral hydropneumothorax with associated chronic left pleural thickening and some calcification. However, there is new multifocal left chest wall and left thoracic inlet subcutaneous gas. And this is associated with soft tissue swelling of the left lateral chest wall (series 2, image 82). Superimposed left apical and anterior left lung bullous emphysema, however, no new left lung pneumothorax is identified. There is however a new since February albeit small focus of peribronchial opacity which is contiguous with the left chest wall in the anterior upper lobe near the left 2/3 interspace seen on series 5, image 49. Intubated. Endotracheal tube terminates above the carina. The major airways remain patent. No right pneumothorax. However, there is widespread consolidation and confluent peribronchial opacity now in the right upper lobe and right middle lobe. The right lower lobe consolidation has improved but not resolved since 12/06/2018, and aside from small volume increased right pleural fluid the right lung base appears stable from the recent CT Abdomen  and Pelvis. There is superimposed right lower lobe bronchiectasis. Upper Abdomen: Enteric tube courses into the stomach. Stable visible noncontrast upper abdomen since 01/09/2019. Musculoskeletal: Abnormal left chest wall with soft tissue swelling and soft tissue gas as stated above. No acute or displaced left rib fracture is identified. The right chest wall appears stable since February. No acute or destructive osseous lesion identified. IMPRESSION: 1. Abnormal left chest wall soft tissue swelling and gas of unclear etiology. Consider a Necrotizing Infection of the left chest wall if this appearance is not explained by recent procedure or instrumentation. 2. Severe Multilobar Right Lung Pneumonia with progression since the February chest CT, although only mild new abnormal opacity in the left lung. Trace right pleural effusion. 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 3. ET tube and NG tube appear well placed. There is no pneumomediastinum. 4. Stable visible upper abdomen since 01/26/2019. Electronically Signed: By: Genevie Ann M.D. On: 01/07/2019 12:57   Dg Chest Port 1 View  Result Date: 2019/01/17 CLINICAL DATA:  Respiratory failure EXAM: PORTABLE CHEST 1 VIEW COMPARISON:  [id number] FINDINGS: Support devices are stable. Bilateral airspace opacities are again noted, right greater than left. Moderate left effusion and small right effusion. Calcified bilateral pleural plaques. Cardiomegaly. Subcutaneous emphysema noted in the left chest wall. IMPRESSION: No significant change since prior study. Electronically Signed   By: Rolm Baptise M.D.   On: 2019-01-17 07:38   Dg Chest Port 1 View  Result Date: 01/17/2019 CLINICAL DATA:  Respiratory distress. EXAM: PORTABLE CHEST 1 VIEW COMPARISON:  Radiographs and CT earlier this day. FINDINGS: Tip of the endotracheal tube is 10 mm from the carina.  Enteric tube place with tip below the diaphragm. Tip of the right central line in the mid SVC. Airspace  consolidation throughout the right lung, unchanged from prior. Calcified pleural plaques on the right. Loculated left hydropneumothorax is unchanged. Increased left basilar airspace disease. Subcutaneous air about the left chest wall, better appreciated on prior CT. IMPRESSION: 1. Tip of the endotracheal tube is 1 cm from the carina. Recommend retraction of 1-2 cm. 2. Increased left basilar airspace disease from exams earlier this day. Chronic left hydropneumothorax is unchanged. 3. Extensive right lung airspace consolidation is unchanged. Electronically Signed   By: Keith Rake M.D.   On: 01/11/2019 00:47   Dg Chest Port 1 View  Result Date: 01/07/2019 CLINICAL DATA:  Acute respiratory failure EXAM: PORTABLE CHEST 1 VIEW COMPARISON:  Yesterday FINDINGS: Endotracheal tube tip between the clavicular heads and carina. The orogastric tube reaches the stomach. Right IJ line with tip at the SVC. Extensive airspace disease, presumably pneumonia. There is chronic calcified pleural plaques and chronic pleural fluid and thickening at the left base where there may also be gas. Questionable improvement in soft tissue emphysema along the thickened left chest wall. The source is uncertain. A call has been placed to the ordering provider. IMPRESSION: 1. Stable hardware positioning. 2. Acute airspace disease on the right, likely pneumonia. 3. Chronic pleural thickening and fluid greater on the left. 4. Continued emphysema in the thickened left chest wall soft tissues from uncertain source. Electronically Signed   By: Monte Fantasia M.D.   On: 01/07/2019 08:33   Dg Chest Port 1 View  Result Date: 01/31/2019 CLINICAL DATA:  Central line placement. EXAM: PORTABLE CHEST 1 VIEW COMPARISON:  Earlier today. FINDINGS: Endotracheal to tip 3 cm above the carina. Interval right jugular catheter with its tip in the superior vena cava. No pneumothorax. Nasogastric tube extending into the stomach with its side hole in the proximal  to mid stomach. No significant change in extensive right lung airspace opacity, bilateral pleural effusions and left lung linear scarring. Bilateral calcified pleural plaques are again noted. The cardiac silhouette remains borderline enlarged. Increased left subcutaneous emphysema. Unremarkable bones. IMPRESSION: 1. Right jugular catheter tip in the superior vena cava without pneumothorax. 2. Stable extensive right lung pneumonia or alveolar edema. 3. Increased subcutaneous emphysema on the left. 4. 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 Electronically Signed   By: Claudie Revering M.D.   On: 01/07/2019 19:13    Baltazar Apo, MD, PhD 02/05/2019, 3:38 PM [facility] [phone] or if no answer [phone]

## 2020-07-24 IMAGING — CT CT CHEST W/O CM
2 of 3 series · 15 of 36 positions shown, 18 images · non-contrast
Comparison: Current chest radiograph.  Prior chest CT, 09/03/2018.

CLINICAL DATA: H/O multiple myeloma on Revlimid, pulmonary
hypertension, chronic hypoxic respiratory failure, chronic
left-sided hydropneumothorax, bronchiectasis, allergic
bronchopulmonary aspergillosis, heterozygous alpha 1 antitrypsin
deficiency, originally was treated for empyema with VATS
decortication in [id number] Today presents with worsening shortness of
breath that is started 3 days prior to admission. He also report
worsening productive cough, hemoptysis over the last 3 days.

EXAM:
CT CHEST WITHOUT CONTRAST
TECHNIQUE: Multidetector CT imaging of the chest was performed following the
standard protocol without IV contrast.

[Series 2: thorax · axial · 0.62mm/px · z∈[-224,+20]mm · 12 of 144 slices shown, 15 images]
[im 11/144  mediastinal]
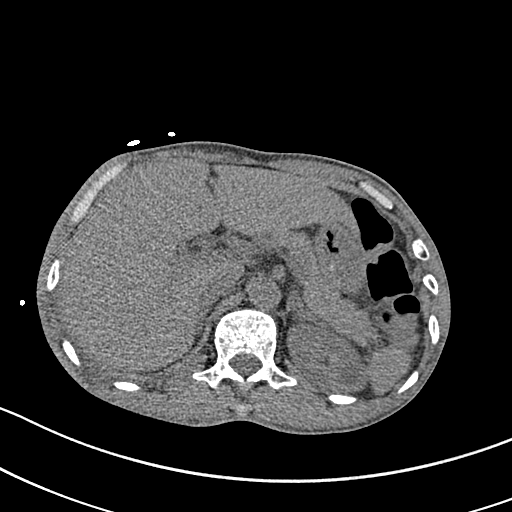
[im 11/144  lung]
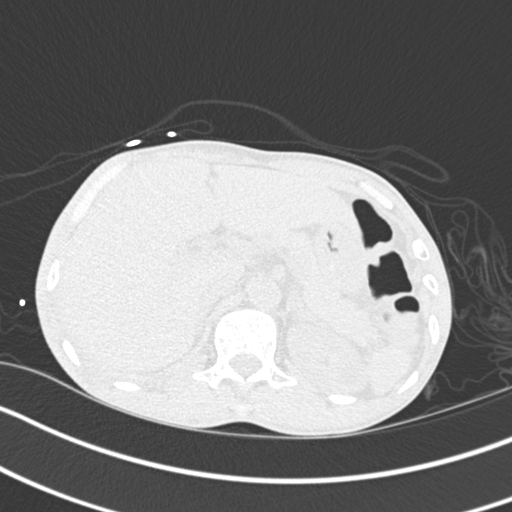
[im 22/144  lung]
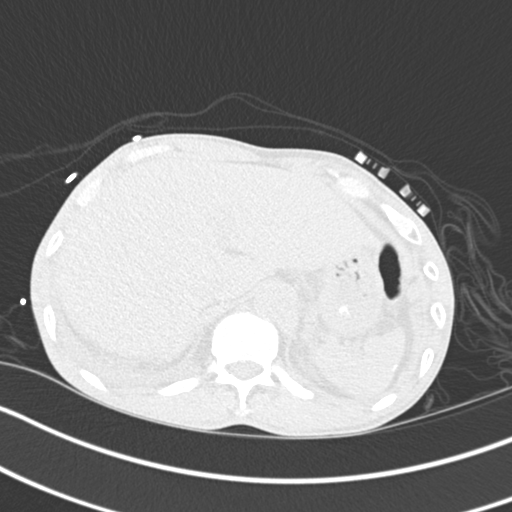
[im 32/144  lung]
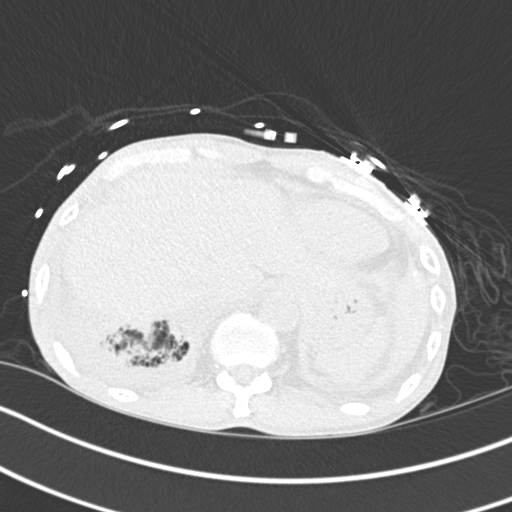
[im 43/144  lung]
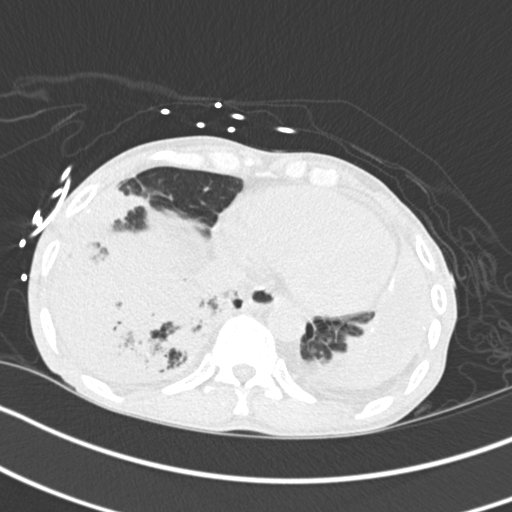
[im 53/144  mediastinal]
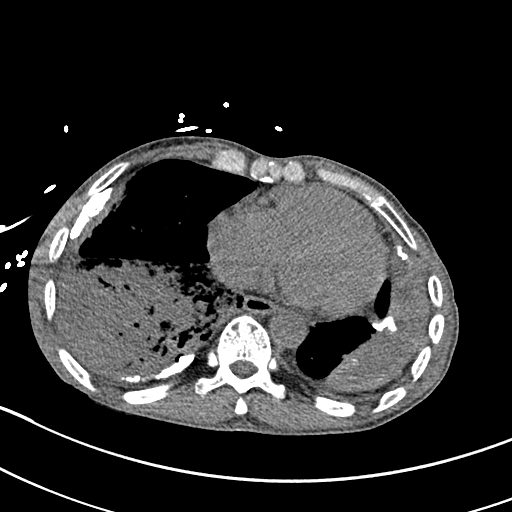
[im 53/144  lung]
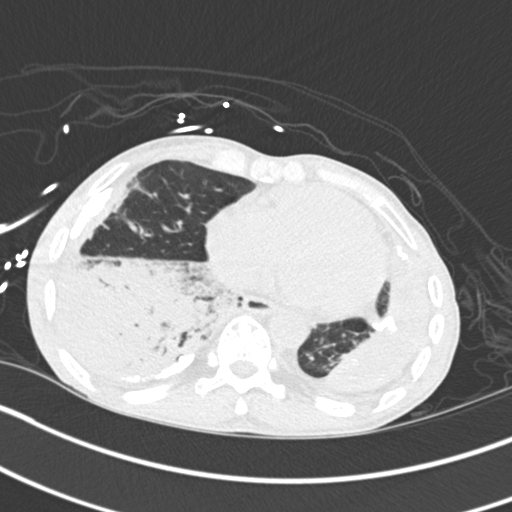
[im 64/144  lung]
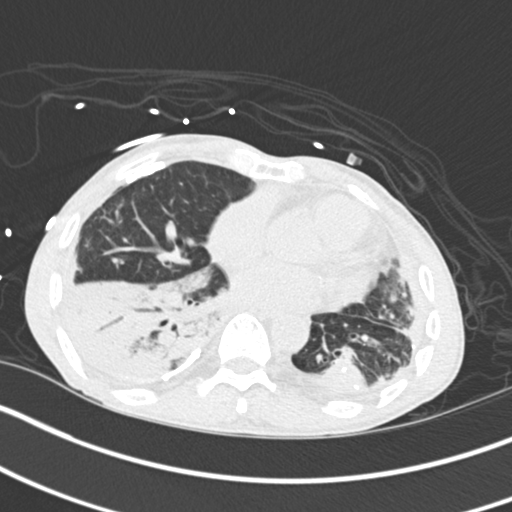
[im 80/144  lung]
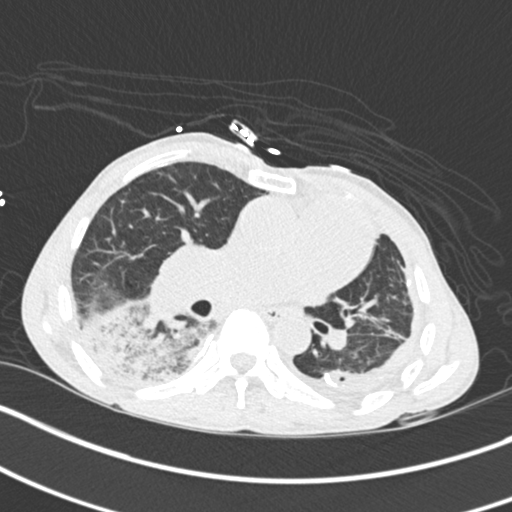
[im 91/144  lung]
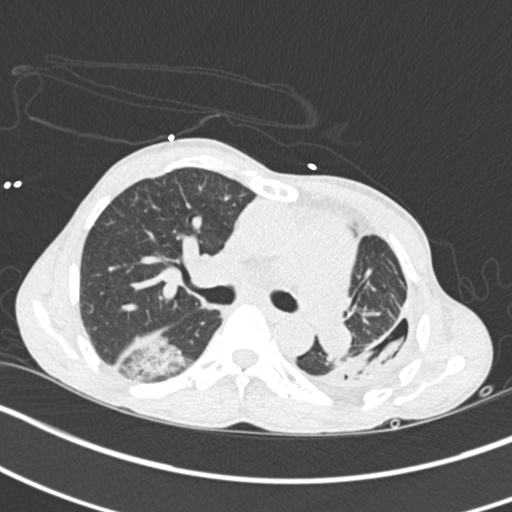
[im 101/144  mediastinal]
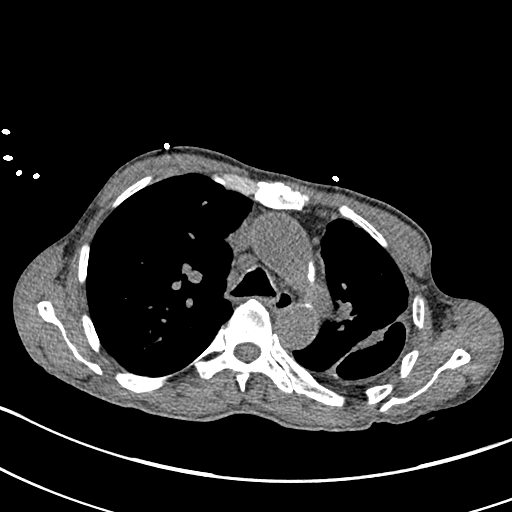
[im 101/144  lung]
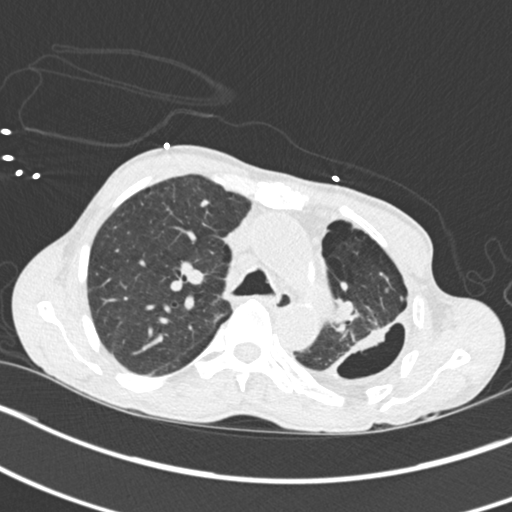
[im 112/144  lung]
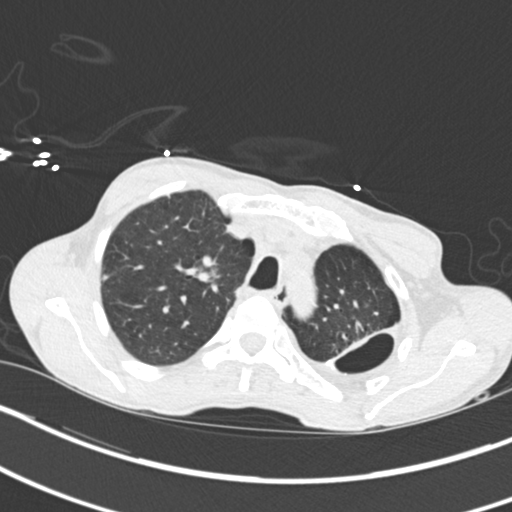
[im 122/144  lung]
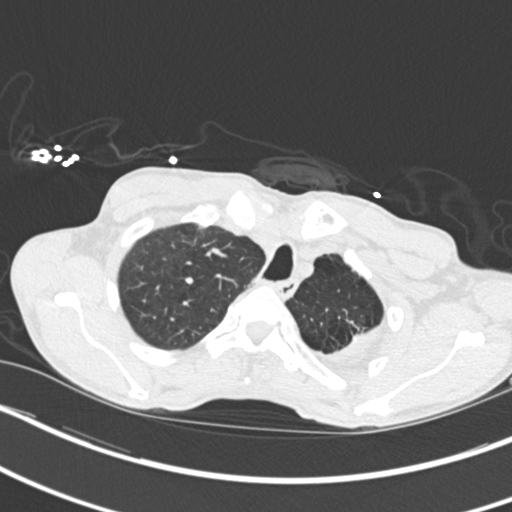
[im 133/144  lung]
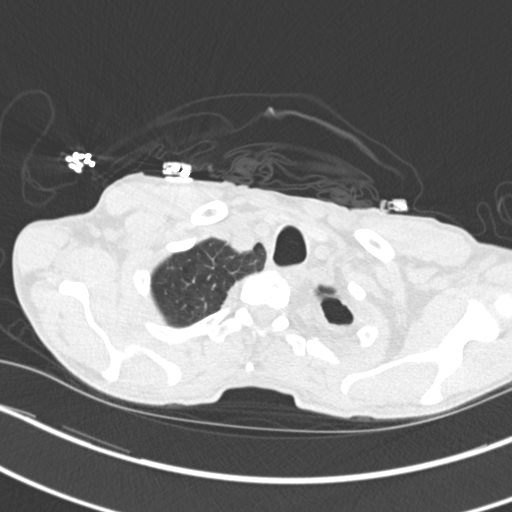

[Series 5: coronal · coronal · 0.55mm/px · 3 of 91 slices shown]
[im 19/91  lung]
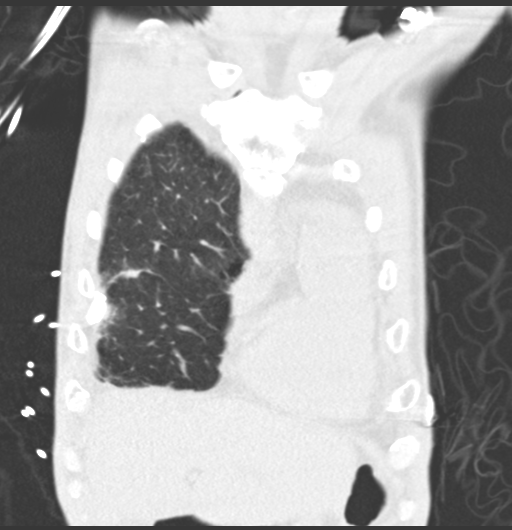
[im 37/91  lung]
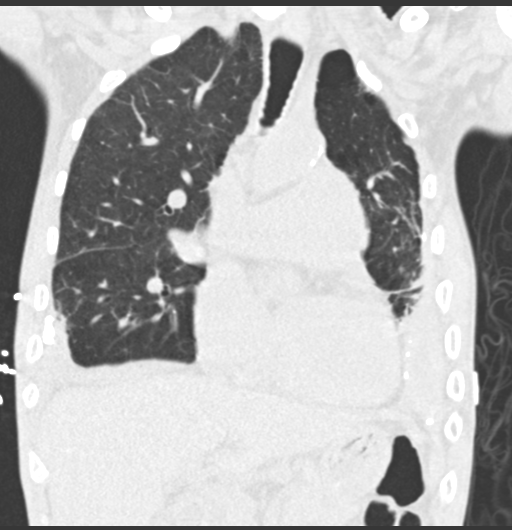
[im 55/91  lung]
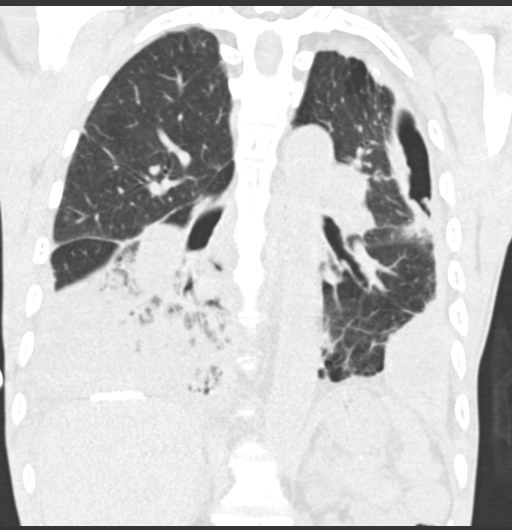

[15 of 36 positions shown; findings below may reference images not displayed]

FINDINGS: Cardiovascular: Heart is normal in size. No significant pericardial
effusion. No coronary artery calcifications.

Aorta is normal in caliber.  Mild aortic atherosclerosis.

Pulmonary arteries are enlarged. Main pulmonary artery measures
cm, right pulmonary artery 3.2 cm in left pulmonary artery 3 cm.

Mediastinum/Nodes: No neck base or axillary masses or enlarged lymph
nodes. No mediastinal or hilar masses or pathologically enlarged
lymph nodes. Trachea and esophagus are unremarkable.

Lungs/Pleura: There is extensive consolidation throughout the right
lower lobe new since the prior CT.

Loculated left pleural effusion with pleural calcifications. There
is pleural space air, containing tissue along the posterolateral mid
to upper left hemithorax. These findings are without significant
change from the prior chest CT.

Linear and coarse reticular opacities noted in the peripheral left
upper lower lobes, right middle lobe and base of the right upper
lobe consistent with scarring/atelectasis, similar to the prior
study. Minimal right pleural effusion. Pleural base calcifications
on the right stable from the prior study.

No evidence of pulmonary edema.  No pneumothorax.

Upper Abdomen: No acute findings.

Musculoskeletal: No chest wall mass or suspicious bone lesions
identified.
IMPRESSION: 1. Extensive consolidation throughout the right lower lobe, new
since the prior exam, consistent with pneumonia.
2. Chronic changes as detailed above, stable from the prior exam,
including loculated left pleural fluid, a loculated area of left
hemithorax pleural air containing tissue and surrounded by pleural
based calcifications, areas of reticular scarring and/or atelectasis
and right-sided pleural base calcifications.
3. There is also significant enlargement of the pulmonary arteries
consistent with pulmonary hypertension.
4. Mild aortic atherosclerosis.

Aortic Atherosclerosis ([id number]).

## 2020-07-26 IMAGING — DX DG CHEST 1V PORT
1 series · 1 of 1 positions shown · non-contrast
Comparison: Chest CT 12/06/2018 and earlier.

CLINICAL DATA: 57-year-old male with respiratory distress.

EXAM:
PORTABLE CHEST 1 VIEW

[chest ap]
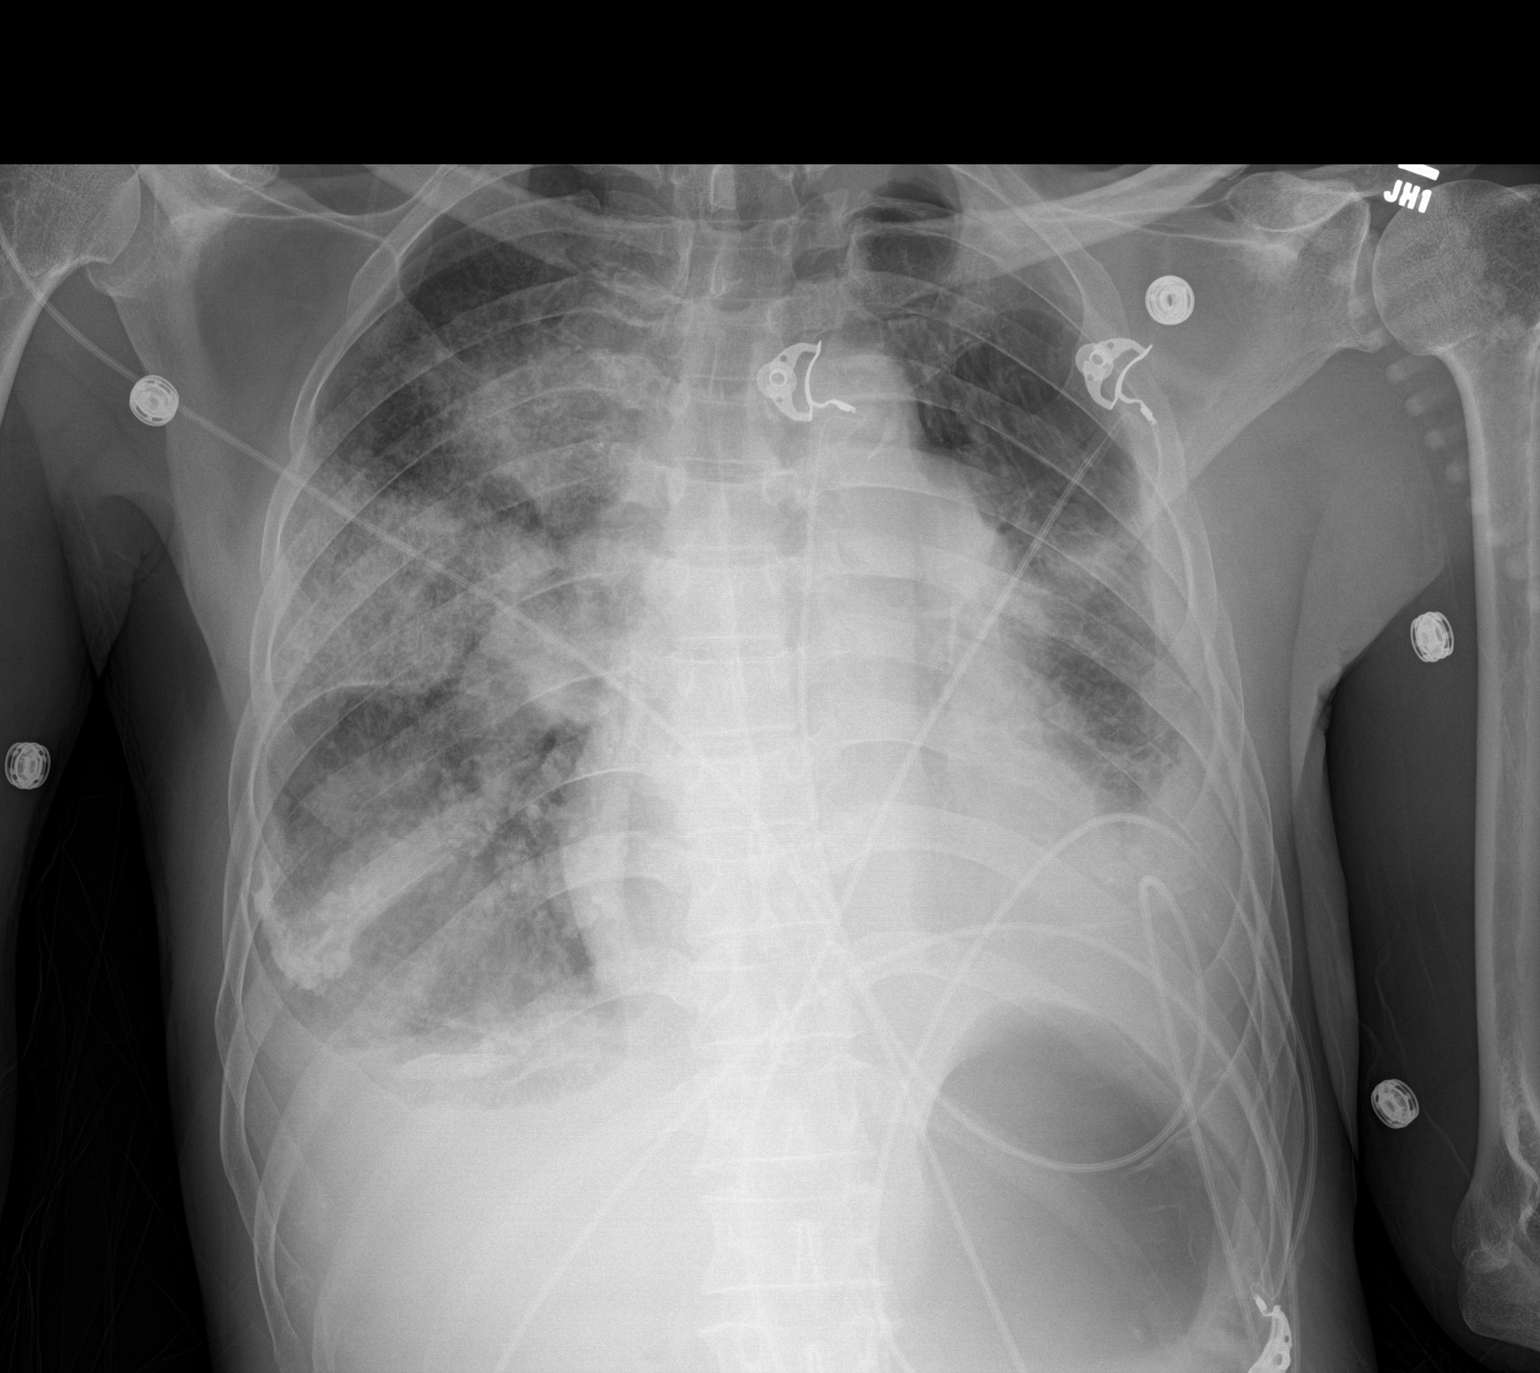

[1 of 1 positions shown; findings below may reference images not displayed]

FINDINGS: Portable AP semi upright view at 0909 hours. Progressed and now
extensive abnormal opacity throughout most of the right lung.
Similar increased left perihilar opacity. Underlying chronic left
lung disease/fibrothorax. No definite acute pleural effusion. Stable
mediastinal contours. Visualized tracheal air column is within
normal limits. Increased gaseous distension of the stomach. No acute
osseous abnormality identified.
IMPRESSION: 1. Progressed and Severe Bilateral Pneumonia since the CT on
12/06/2018.
2. Underlying fibrothorax, no definite acute pleural effusion.

## 2020-07-30 IMAGING — DX DG CHEST 1V PORT
1 series · 1 of 1 positions shown · non-contrast
Comparison: Yesterday

CLINICAL DATA: Endotracheal tube

EXAM:
PORTABLE CHEST 1 VIEW

[chest ap]
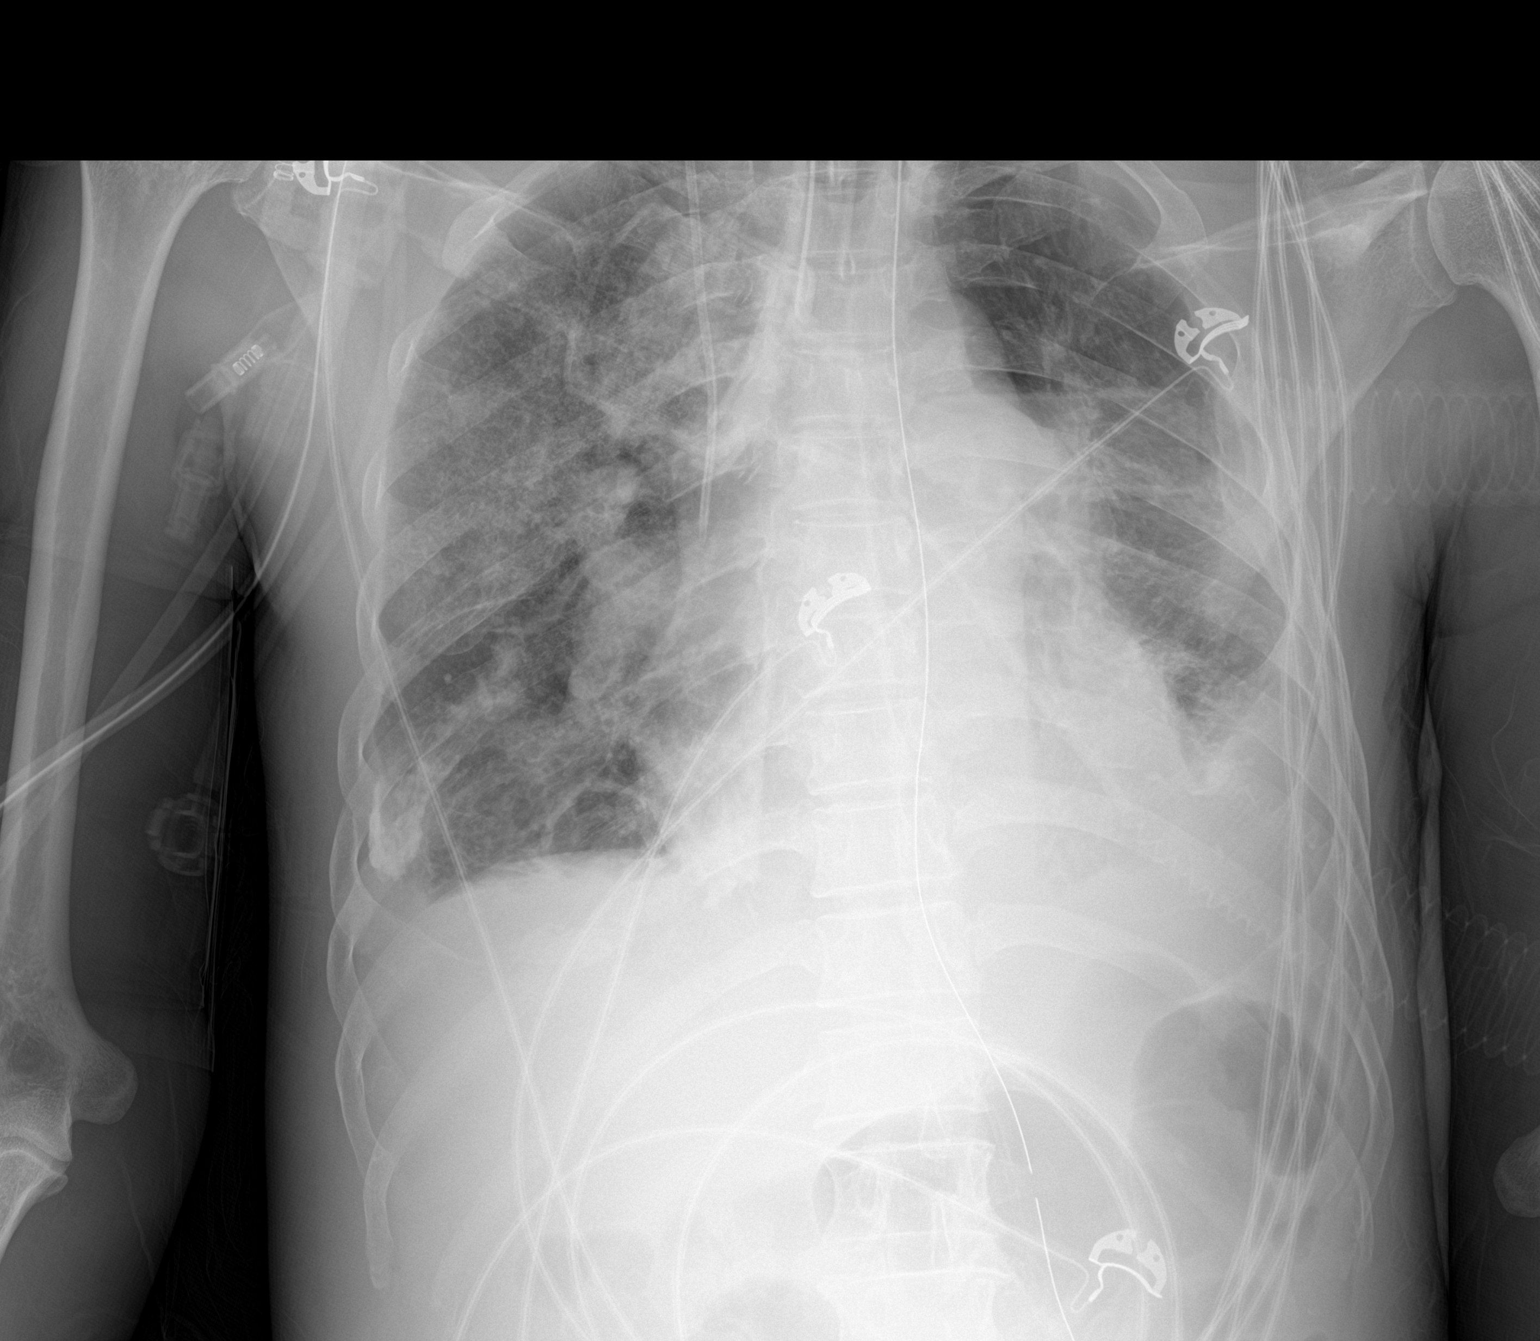

[1 of 1 positions shown; findings below may reference images not displayed]

FINDINGS: Endotracheal tube tip in good position between the clavicular heads
and carina. The orogastric tube is in the stomach. Unremarkable
right IJ line. Bilateral pneumonia with left more than right pleural
fluid which is at least partially chronic based on priors. Calcified
pleural plaques.
IMPRESSION: Stable hardware positioning and bilateral pneumonia.

## 2020-08-01 IMAGING — DX DG ABDOMEN 2V
2 series · 2 of 2 positions shown · non-contrast
Comparison: Chest x-ray December 13, 2018

CLINICAL DATA: Abdominal pain.

EXAM:
ABDOMEN - 2 VIEW

[abdomen kub]
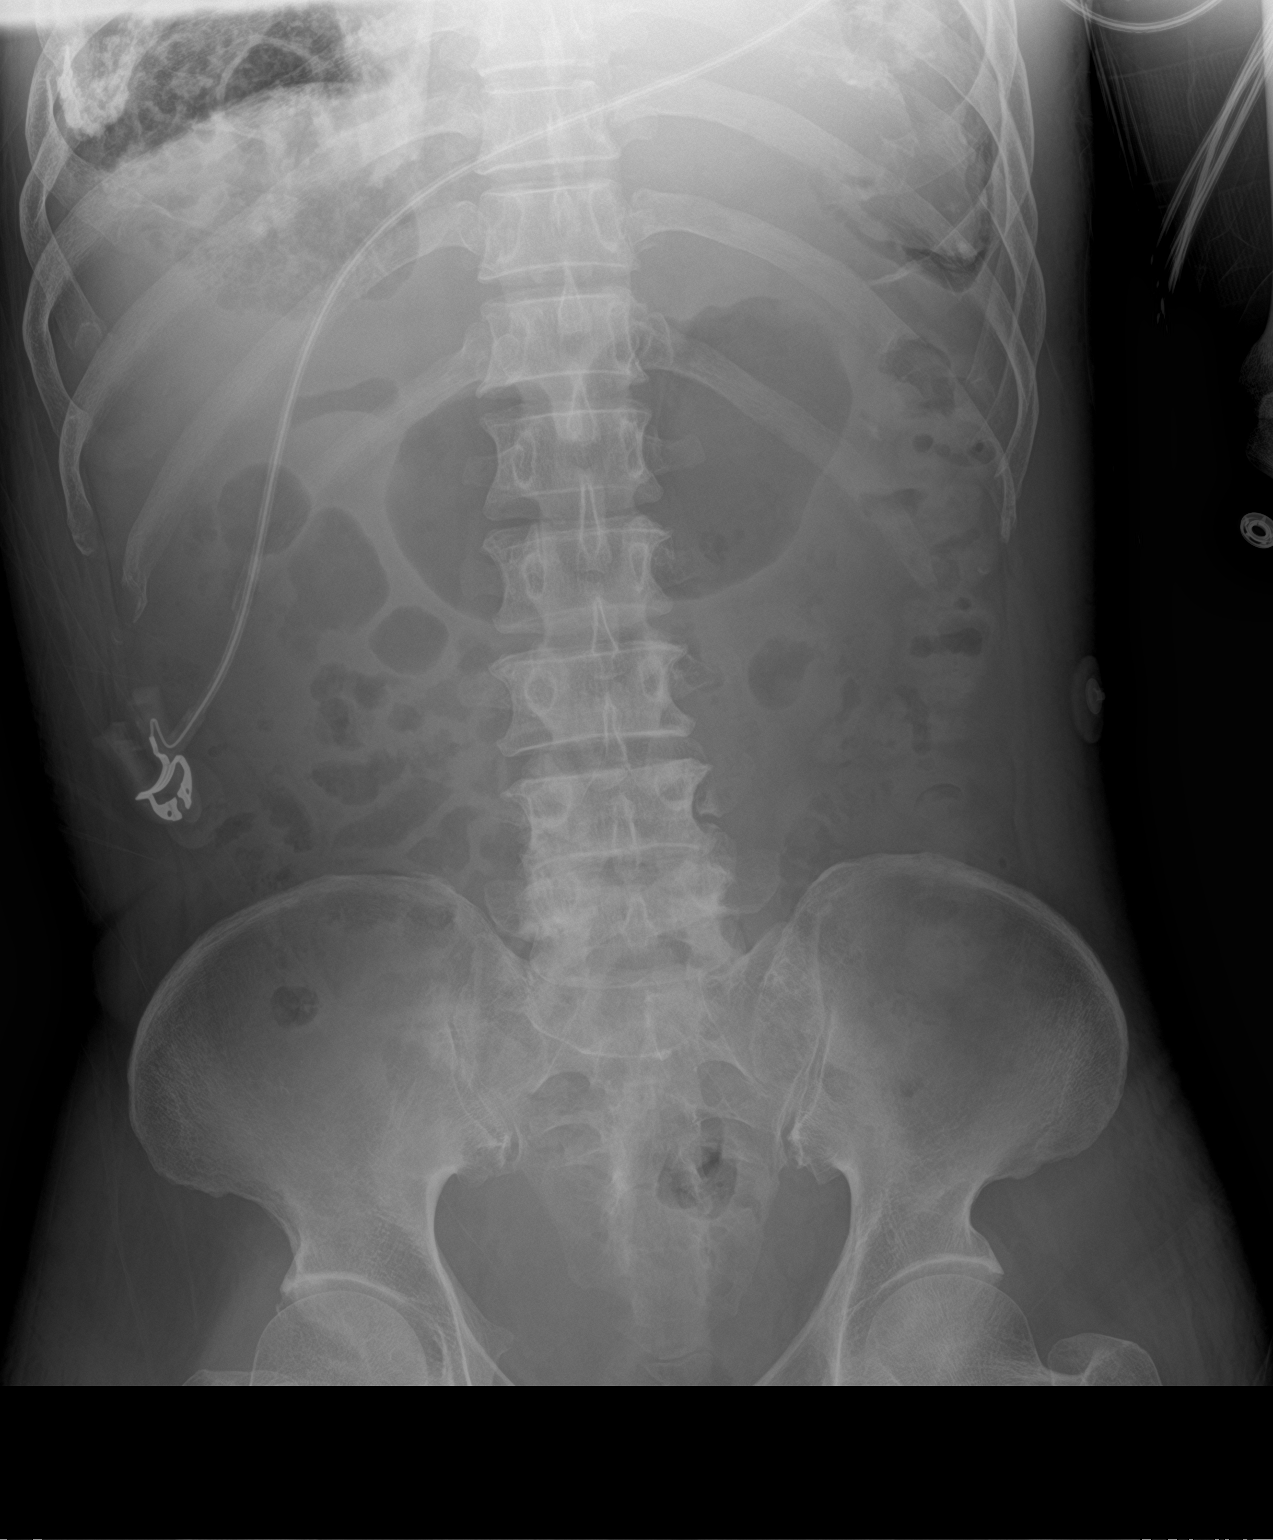

[abdomen decu]
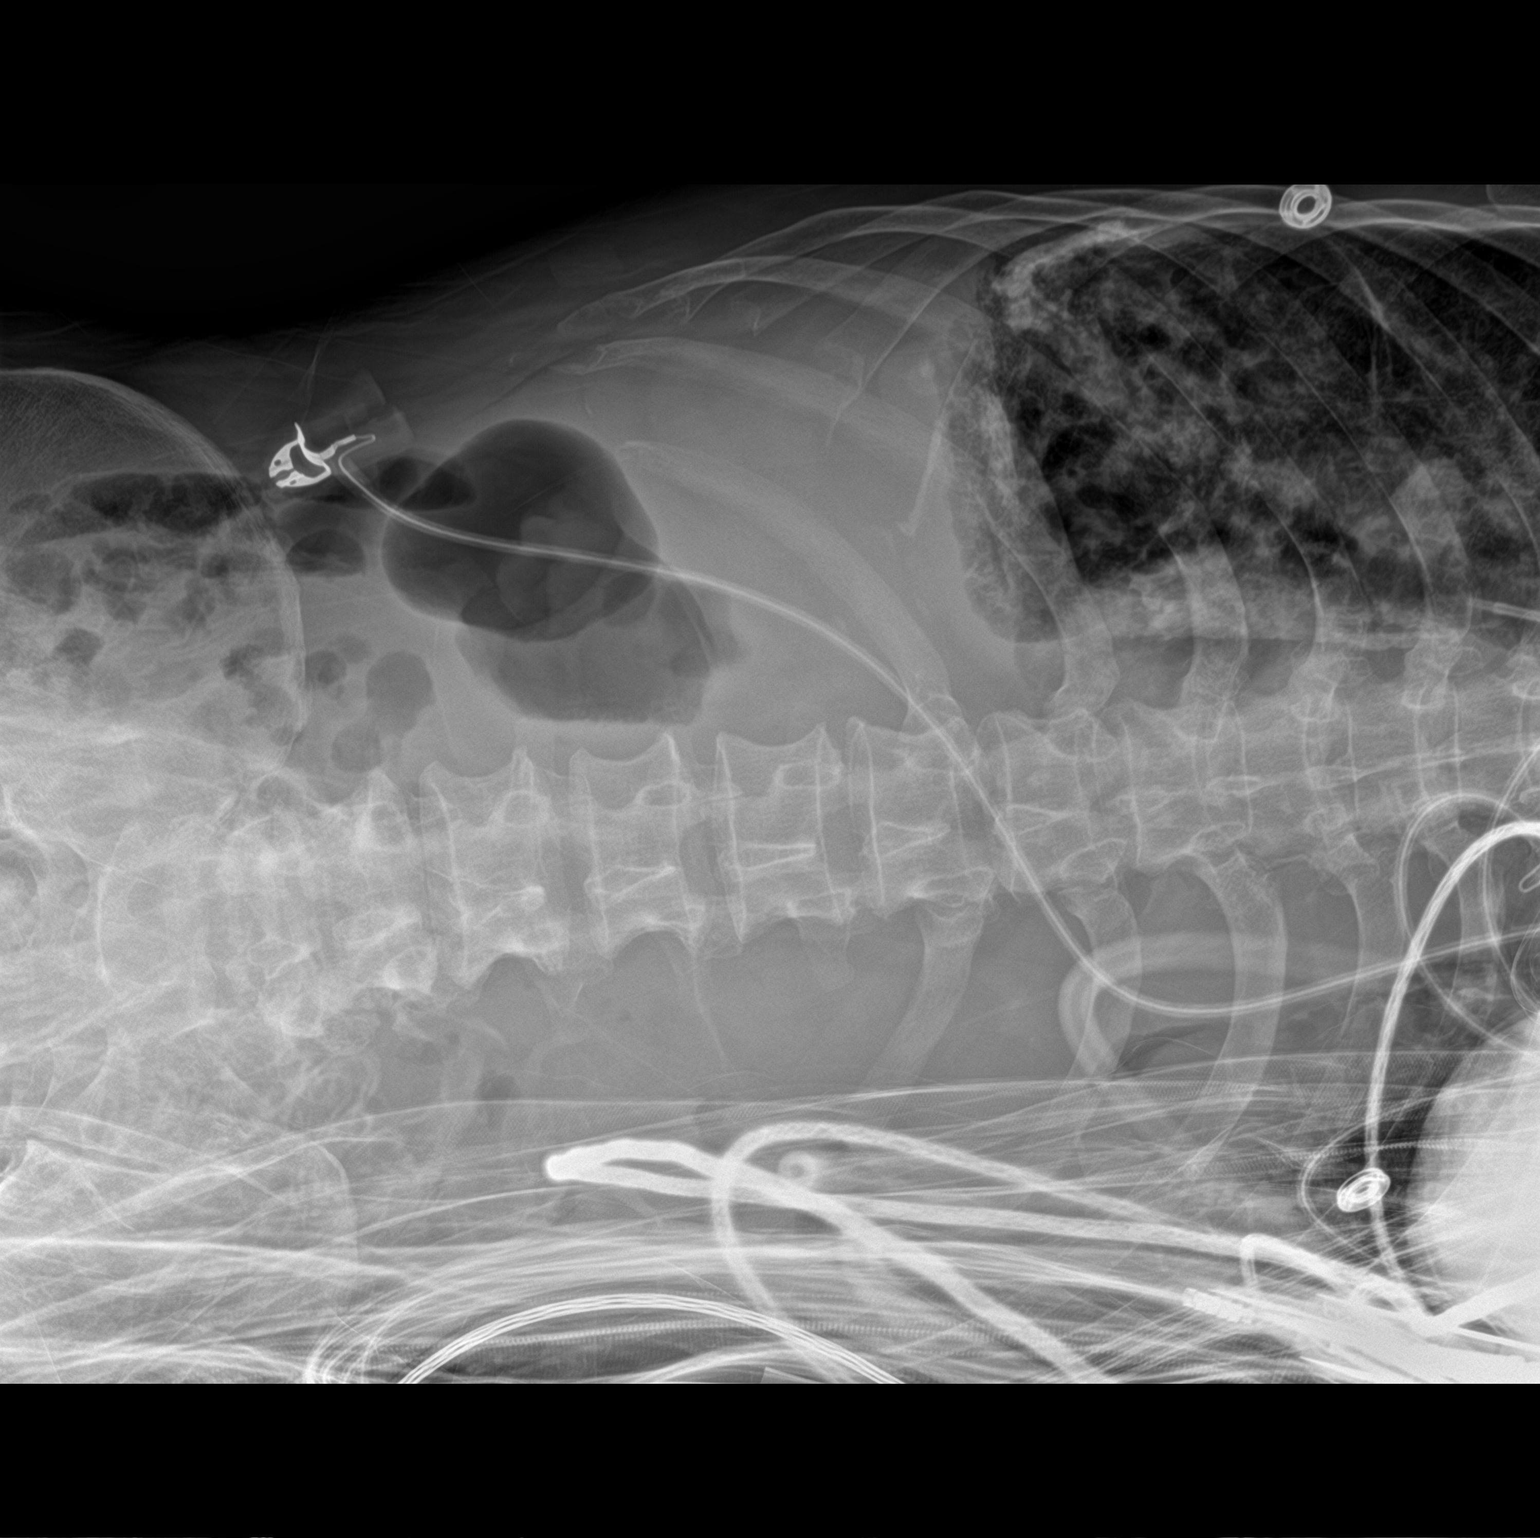

[2 of 2 positions shown; findings below may reference images not displayed]

FINDINGS: Suspected small loculated left basilar pneumothorax, unchanged since
yesterday's chest x-ray. Opacity in the right base remains. The
bowel gas pattern is nonobstructive. The abdomen is otherwise
unremarkable. A left side down decubitus film demonstrates no free
air. Calcification is seen in the right basilar pleura or
parenchyma. No other acute abnormalities.
IMPRESSION: No acute abnormality in the abdomen. A right basilar infiltrate.
Suspected loculated pneumothorax in the left base, described on
yesterday's chest x-ray.
